# Patient Record
Sex: Male | Born: 1955 | Race: White | Hispanic: No | Smoking: Former smoker
Health system: Southern US, Community
[De-identification: ages and names within clinical notes are randomized; demographics above are authoritative.]

## PROBLEM LIST (undated history)

## (undated) DIAGNOSIS — I469 Cardiac arrest, cause unspecified: Secondary | ICD-10-CM

## (undated) DIAGNOSIS — I499 Cardiac arrhythmia, unspecified: Secondary | ICD-10-CM

## (undated) DIAGNOSIS — R0602 Shortness of breath: Secondary | ICD-10-CM

## (undated) DIAGNOSIS — D3610 Benign neoplasm of peripheral nerves and autonomic nervous system, unspecified: Secondary | ICD-10-CM

## (undated) DIAGNOSIS — R413 Other amnesia: Secondary | ICD-10-CM

## (undated) DIAGNOSIS — J302 Other seasonal allergic rhinitis: Secondary | ICD-10-CM

## (undated) DIAGNOSIS — I639 Cerebral infarction, unspecified: Secondary | ICD-10-CM

## (undated) DIAGNOSIS — N189 Chronic kidney disease, unspecified: Secondary | ICD-10-CM

## (undated) DIAGNOSIS — Z950 Presence of cardiac pacemaker: Secondary | ICD-10-CM

## (undated) DIAGNOSIS — I472 Ventricular tachycardia: Secondary | ICD-10-CM

## (undated) DIAGNOSIS — I442 Atrioventricular block, complete: Secondary | ICD-10-CM

## (undated) DIAGNOSIS — N135 Crossing vessel and stricture of ureter without hydronephrosis: Secondary | ICD-10-CM

## (undated) HISTORY — DX: Ventricular tachycardia: I47.2

## (undated) HISTORY — DX: Presence of cardiac pacemaker: Z95.0

## (undated) HISTORY — DX: Atrioventricular block, complete: I44.2

## (undated) HISTORY — DX: Cardiac arrest, cause unspecified: I46.9

---

## 2011-06-21 DIAGNOSIS — I499 Cardiac arrhythmia, unspecified: Secondary | ICD-10-CM

## 2011-06-21 HISTORY — DX: Cardiac arrhythmia, unspecified: I49.9

## 2011-07-09 ENCOUNTER — Encounter (HOSPITAL_COMMUNITY): Payer: Self-pay | Admitting: Cardiology

## 2011-07-09 ENCOUNTER — Inpatient Hospital Stay (HOSPITAL_COMMUNITY)
Admission: EM | Admit: 2011-07-09 | Discharge: 2011-07-18 | DRG: 242 | Disposition: A | Discharge status: Home Health | Payer: Medicaid Other | Attending: Cardiovascular Disease | Admitting: Cardiovascular Disease

## 2011-07-09 ENCOUNTER — Encounter (HOSPITAL_COMMUNITY)
Admission: EM | Disposition: A | Discharge status: Home Health | Payer: Self-pay | Source: Home / Self Care | Attending: Internal Medicine

## 2011-07-09 ENCOUNTER — Ambulatory Visit (HOSPITAL_COMMUNITY): Admit: 2011-07-09 | Payer: Self-pay | Admitting: Interventional Cardiology

## 2011-07-09 ENCOUNTER — Other Ambulatory Visit: Payer: Self-pay

## 2011-07-09 ENCOUNTER — Emergency Department (HOSPITAL_COMMUNITY): Payer: Medicaid Other

## 2011-07-09 DIAGNOSIS — W1809XA Striking against other object with subsequent fall, initial encounter: Secondary | ICD-10-CM | POA: Diagnosis present

## 2011-07-09 DIAGNOSIS — R7881 Bacteremia: Secondary | ICD-10-CM | POA: Diagnosis present

## 2011-07-09 DIAGNOSIS — Z79899 Other long term (current) drug therapy: Secondary | ICD-10-CM

## 2011-07-09 DIAGNOSIS — B954 Other streptococcus as the cause of diseases classified elsewhere: Secondary | ICD-10-CM | POA: Diagnosis present

## 2011-07-09 DIAGNOSIS — N365 Urethral false passage: Secondary | ICD-10-CM | POA: Diagnosis present

## 2011-07-09 DIAGNOSIS — Z87891 Personal history of nicotine dependence: Secondary | ICD-10-CM

## 2011-07-09 DIAGNOSIS — J9600 Acute respiratory failure, unspecified whether with hypoxia or hypercapnia: Secondary | ICD-10-CM

## 2011-07-09 DIAGNOSIS — I4901 Ventricular fibrillation: Principal | ICD-10-CM | POA: Diagnosis present

## 2011-07-09 DIAGNOSIS — R001 Bradycardia, unspecified: Secondary | ICD-10-CM

## 2011-07-09 DIAGNOSIS — Y9229 Other specified public building as the place of occurrence of the external cause: Secondary | ICD-10-CM

## 2011-07-09 DIAGNOSIS — I442 Atrioventricular block, complete: Secondary | ICD-10-CM | POA: Diagnosis present

## 2011-07-09 DIAGNOSIS — R092 Respiratory arrest: Secondary | ICD-10-CM | POA: Diagnosis present

## 2011-07-09 DIAGNOSIS — I469 Cardiac arrest, cause unspecified: Secondary | ICD-10-CM | POA: Diagnosis present

## 2011-07-09 DIAGNOSIS — I214 Non-ST elevation (NSTEMI) myocardial infarction: Secondary | ICD-10-CM | POA: Diagnosis not present

## 2011-07-09 DIAGNOSIS — I498 Other specified cardiac arrhythmias: Secondary | ICD-10-CM | POA: Diagnosis present

## 2011-07-09 DIAGNOSIS — S0100XA Unspecified open wound of scalp, initial encounter: Secondary | ICD-10-CM | POA: Diagnosis present

## 2011-07-09 DIAGNOSIS — Z8249 Family history of ischemic heart disease and other diseases of the circulatory system: Secondary | ICD-10-CM

## 2011-07-09 DIAGNOSIS — I10 Essential (primary) hypertension: Secondary | ICD-10-CM | POA: Diagnosis present

## 2011-07-09 HISTORY — PX: LEFT HEART CATHETERIZATION WITH CORONARY ANGIOGRAM: SHX5451

## 2011-07-09 HISTORY — PX: CARDIAC CATHETERIZATION: SHX172

## 2011-07-09 HISTORY — PX: TEMPORARY PACEMAKER INSERTION: SHX5471

## 2011-07-09 HISTORY — DX: Shortness of breath: R06.02

## 2011-07-09 LAB — CULTURE, BLOOD (ROUTINE X 2)
Culture  Setup Time: 201301191959
Culture  Setup Time: 201301191959

## 2011-07-09 LAB — COMPREHENSIVE METABOLIC PANEL
ALT: 145 U/L — ABNORMAL HIGH (ref 0–53)
ALT: 134 U/L — ABNORMAL HIGH (ref 0–53)
AST: 90 U/L — ABNORMAL HIGH (ref 0–37)
AST: 79 U/L — ABNORMAL HIGH (ref 0–37)
Albumin: 3.2 g/dL — ABNORMAL LOW (ref 3.5–5.2)
Albumin: 3.2 g/dL — ABNORMAL LOW (ref 3.5–5.2)
Alkaline Phosphatase: 96 U/L (ref 39–117)
Alkaline Phosphatase: 93 U/L (ref 39–117)
BUN: 17 mg/dL (ref 6–23)
BUN: 15 mg/dL (ref 6–23)
CO2: 21 mEq/L (ref 19–32)
CO2: 19 mEq/L (ref 19–32)
Calcium: 8 mg/dL — ABNORMAL LOW (ref 8.4–10.5)
Calcium: 6.9 mg/dL — ABNORMAL LOW (ref 8.4–10.5)
Chloride: 104 mEq/L (ref 96–112)
Chloride: 106 mEq/L (ref 96–112)
Creatinine, Ser: 1.14 mg/dL (ref 0.50–1.35)
Creatinine, Ser: 0.77 mg/dL (ref 0.50–1.35)
GFR calc Af Amer: 81 mL/min — ABNORMAL LOW (ref >90)
GFR calc Af Amer: >90 mL/min (ref >90)
GFR calc non Af Amer: 70 mL/min — ABNORMAL LOW (ref >90)
GFR calc non Af Amer: >90 mL/min (ref >90)
Glucose, Bld: 162 mg/dL — ABNORMAL HIGH (ref 70–99)
Glucose, Bld: 129 mg/dL — ABNORMAL HIGH (ref 70–99)
Potassium: 4.2 mEq/L (ref 3.5–5.1)
Potassium: 4.3 mEq/L (ref 3.5–5.1)
Sodium: 137 mEq/L (ref 135–145)
Sodium: 136 mEq/L (ref 135–145)
Total Bilirubin: 0.5 mg/dL (ref 0.3–1.2)
Total Bilirubin: 0.6 mg/dL (ref 0.3–1.2)
Total Protein: 6.1 g/dL (ref 6.0–8.3)
Total Protein: 6.1 g/dL (ref 6.0–8.3)

## 2011-07-09 LAB — CBC
HCT: 42.2 % (ref 39.0–52.0)
HCT: 39.6 % (ref 39.0–52.0)
Hemoglobin: 13.8 g/dL (ref 13.0–17.0)
Hemoglobin: 13.4 g/dL (ref 13.0–17.0)
MCH: 30.7 pg (ref 26.0–34.0)
MCH: 30.7 pg (ref 26.0–34.0)
MCHC: 32.7 g/dL (ref 30.0–36.0)
MCHC: 33.8 g/dL (ref 30.0–36.0)
MCV: 94 fL (ref 78.0–100.0)
MCV: 90.8 fL (ref 78.0–100.0)
Platelets: 189 10*3/uL (ref 150–400)
Platelets: 145 10*3/uL — ABNORMAL LOW (ref 150–400)
RBC: 4.49 MIL/uL (ref 4.22–5.81)
RBC: 4.36 MIL/uL (ref 4.22–5.81)
RDW: 14 % (ref 11.5–15.5)
RDW: 13.8 % (ref 11.5–15.5)
WBC: 15.3 10*3/uL — ABNORMAL HIGH (ref 4.0–10.5)
WBC: 14.8 10*3/uL — ABNORMAL HIGH (ref 4.0–10.5)

## 2011-07-09 LAB — CK TOTAL AND CKMB (NOT AT ARMC)
CK, MB: 2.7 ng/mL (ref 0.3–4.0)
CK, MB: 15.7 ng/mL (ref 0.3–4.0)
Relative Index: 2.1 (ref 0.0–2.5)
Relative Index: 0.8 (ref 0.0–2.5)
Total CK: 131 U/L (ref 7–232)
Total CK: 1911 U/L — ABNORMAL HIGH (ref 7–232)

## 2011-07-09 LAB — BASIC METABOLIC PANEL
BUN: 17 mg/dL (ref 6–23)
BUN: 16 mg/dL (ref 6–23)
BUN: 15 mg/dL (ref 6–23)
CO2: 17 mEq/L — ABNORMAL LOW (ref 19–32)
CO2: 18 mEq/L — ABNORMAL LOW (ref 19–32)
CO2: 19 mEq/L (ref 19–32)
Calcium: 7.5 mg/dL — ABNORMAL LOW (ref 8.4–10.5)
Calcium: 7.7 mg/dL — ABNORMAL LOW (ref 8.4–10.5)
Calcium: 8 mg/dL — ABNORMAL LOW (ref 8.4–10.5)
Chloride: 105 mEq/L (ref 96–112)
Chloride: 107 mEq/L (ref 96–112)
Chloride: 108 mEq/L (ref 96–112)
Creatinine, Ser: 1.23 mg/dL (ref 0.50–1.35)
Creatinine, Ser: 1.08 mg/dL (ref 0.50–1.35)
Creatinine, Ser: 0.87 mg/dL (ref 0.50–1.35)
GFR calc Af Amer: 74 mL/min — ABNORMAL LOW (ref >90)
GFR calc Af Amer: 86 mL/min — ABNORMAL LOW (ref >90)
GFR calc Af Amer: >90 mL/min (ref >90)
GFR calc non Af Amer: 64 mL/min — ABNORMAL LOW (ref >90)
GFR calc non Af Amer: 74 mL/min — ABNORMAL LOW (ref >90)
GFR calc non Af Amer: >90 mL/min (ref >90)
Glucose, Bld: 188 mg/dL — ABNORMAL HIGH (ref 70–99)
Glucose, Bld: 156 mg/dL — ABNORMAL HIGH (ref 70–99)
Glucose, Bld: 140 mg/dL — ABNORMAL HIGH (ref 70–99)
Potassium: 3.8 mEq/L (ref 3.5–5.1)
Potassium: 3.8 mEq/L (ref 3.5–5.1)
Potassium: 3.8 mEq/L (ref 3.5–5.1)
Sodium: 134 mEq/L — ABNORMAL LOW (ref 135–145)
Sodium: 136 mEq/L (ref 135–145)
Sodium: 137 mEq/L (ref 135–145)

## 2011-07-09 LAB — RAPID URINE DRUG SCREEN, HOSP PERFORMED
Amphetamines: NOT DETECTED
Barbiturates: NOT DETECTED
Benzodiazepines: POSITIVE — AB
Cocaine: NOT DETECTED
Opiates: NOT DETECTED
Tetrahydrocannabinol: NOT DETECTED

## 2011-07-09 LAB — POCT I-STAT, CHEM 8
BUN: 20 mg/dL (ref 6–23)
Calcium, Ion: 1.05 mmol/L — ABNORMAL LOW (ref 1.12–1.32)
Chloride: 108 mEq/L (ref 96–112)
Creatinine, Ser: 1.2 mg/dL (ref 0.50–1.35)
Glucose, Bld: 158 mg/dL — ABNORMAL HIGH (ref 70–99)
HCT: 43 % (ref 39.0–52.0)
Hemoglobin: 14.6 g/dL (ref 13.0–17.0)
Potassium: 4.4 mEq/L (ref 3.5–5.1)
Sodium: 141 mEq/L (ref 135–145)
TCO2: 22 mmol/L (ref 0–100)

## 2011-07-09 LAB — GLUCOSE, CAPILLARY
Glucose-Capillary: 136 mg/dL — ABNORMAL HIGH (ref 70–99)
Glucose-Capillary: 147 mg/dL — ABNORMAL HIGH (ref 70–99)
Glucose-Capillary: 126 mg/dL — ABNORMAL HIGH (ref 70–99)
Glucose-Capillary: 131 mg/dL — ABNORMAL HIGH (ref 70–99)

## 2011-07-09 LAB — CARDIAC PANEL(CRET KIN+CKTOT+MB+TROPI)
CK, MB: 13 ng/mL (ref 0.3–4.0)
Relative Index: 0.9 (ref 0.0–2.5)
Total CK: 1471 U/L — ABNORMAL HIGH (ref 7–232)
Troponin I: 0.96 ng/mL (ref <0.30)

## 2011-07-09 LAB — URINALYSIS, ROUTINE W REFLEX MICROSCOPIC
Bilirubin Urine: NEGATIVE
Glucose, UA: NEGATIVE mg/dL
Ketones, ur: 15 mg/dL — AB
Nitrite: NEGATIVE
Protein, ur: 30 mg/dL — AB
Specific Gravity, Urine: 1.008 (ref 1.005–1.030)
Urobilinogen, UA: 1 mg/dL (ref 0.0–1.0)
pH: 7 (ref 5.0–8.0)

## 2011-07-09 LAB — PROTIME-INR
INR: 1.29 (ref 0.00–1.49)
INR: 1.16 (ref 0.00–1.49)
INR: 1.18 (ref 0.00–1.49)
Prothrombin Time: 16.3 seconds — ABNORMAL HIGH (ref 11.6–15.2)
Prothrombin Time: 15 seconds (ref 11.6–15.2)
Prothrombin Time: 15.3 seconds — ABNORMAL HIGH (ref 11.6–15.2)

## 2011-07-09 LAB — PROCALCITONIN: Procalcitonin: <0.1 ng/mL

## 2011-07-09 LAB — POCT I-STAT 3, ART BLOOD GAS (G3+)
Acid-base deficit: 4 mmol/L — ABNORMAL HIGH (ref 0.0–2.0)
Acid-base deficit: 8 mmol/L — ABNORMAL HIGH (ref 0.0–2.0)
Bicarbonate: 22.6 mEq/L (ref 20.0–24.0)
Bicarbonate: 18 mEq/L — ABNORMAL LOW (ref 20.0–24.0)
O2 Saturation: 100 %
O2 Saturation: 100 %
Patient temperature: 98.6
Patient temperature: 33
TCO2: 24 mmol/L (ref 0–100)
TCO2: 19 mmol/L (ref 0–100)
pCO2 arterial: 48.2 mmHg — ABNORMAL HIGH (ref 35.0–45.0)
pCO2 arterial: 31 mmHg — ABNORMAL LOW (ref 35.0–45.0)
pH, Arterial: 7.279 — ABNORMAL LOW (ref 7.350–7.450)
pH, Arterial: 7.352 (ref 7.350–7.450)
pO2, Arterial: 308 mmHg — ABNORMAL HIGH (ref 80.0–100.0)
pO2, Arterial: 386 mmHg — ABNORMAL HIGH (ref 80.0–100.0)

## 2011-07-09 LAB — LEGIONELLA ANTIGEN, URINE: Legionella Antigen, Urine: NEGATIVE

## 2011-07-09 LAB — APTT
aPTT: 30 seconds (ref 24–37)
aPTT: 30 seconds (ref 24–37)
aPTT: 32 seconds (ref 24–37)

## 2011-07-09 LAB — POCT I-STAT TROPONIN I: Troponin i, poc: 0.06 ng/mL (ref 0.00–0.08)

## 2011-07-09 LAB — MRSA PCR SCREENING: MRSA by PCR: NEGATIVE

## 2011-07-09 LAB — PHOSPHORUS: Phosphorus: 1.6 mg/dL — ABNORMAL LOW (ref 2.3–4.6)

## 2011-07-09 LAB — TROPONIN I: Troponin I: 0.87 ng/mL (ref <0.30)

## 2011-07-09 LAB — URINE CULTURE
Colony Count: NO GROWTH
Culture  Setup Time: 201301200212
Culture: NO GROWTH

## 2011-07-09 LAB — CORTISOL: Cortisol, Plasma: 46.5 ug/dL

## 2011-07-09 LAB — URINE MICROSCOPIC-ADD ON

## 2011-07-09 LAB — PRO B NATRIURETIC PEPTIDE: Pro B Natriuretic peptide (BNP): 870.7 pg/mL — ABNORMAL HIGH (ref 0–125)

## 2011-07-09 LAB — MAGNESIUM: Magnesium: 1.8 mg/dL (ref 1.5–2.5)

## 2011-07-09 LAB — STREP PNEUMONIAE URINARY ANTIGEN: Strep Pneumo Urinary Antigen: NEGATIVE

## 2011-07-09 LAB — LACTIC ACID, PLASMA: Lactic Acid, Venous: 1 mmol/L (ref 0.5–2.2)

## 2011-07-09 SURGERY — LEFT HEART CATHETERIZATION WITH CORONARY ANGIOGRAM
Anesthesia: LOCAL | Laterality: Right

## 2011-07-09 MED ORDER — FENTANYL CITRATE 0.05 MG/ML IJ SOLN
50.0000 ug | Freq: Once | INTRAMUSCULAR | Status: AC
  Administered 2011-07-09: 50 ug via INTRAVENOUS

## 2011-07-09 MED ORDER — DEXTROSE 5 % IV SOLN
0.5000 ug/min | INTRAVENOUS | Status: DC
  Administered 2011-07-10: 2 ug/min via INTRAVENOUS
  Filled 2011-07-09 (×2): qty 4

## 2011-07-09 MED ORDER — DEXTROSE 5 % IV SOLN: 60.0000 mg/h | Freq: Once | INTRAVENOUS | Status: DC

## 2011-07-09 MED ORDER — CISATRACURIUM BESYLATE 2 MG/ML IV SOLN
8.0000 mg | Freq: Once | INTRAVENOUS | Status: AC | PRN
  Filled 2011-07-09: qty 4

## 2011-07-09 MED ORDER — ACETAMINOPHEN 325 MG PO TABS
650.0000 mg | ORAL_TABLET | ORAL | Status: DC | PRN
  Administered 2011-07-12 – 2011-07-15 (×5): 650 mg via ORAL
  Filled 2011-07-09 (×5): qty 2

## 2011-07-09 MED ORDER — CISATRACURIUM BESYLATE 2 MG/ML IV SOLN
0.1000 mg/kg | Freq: Once | INTRAVENOUS | Status: AC
  Administered 2011-07-09: 1 ug via INTRAVENOUS

## 2011-07-09 MED ORDER — MIDAZOLAM HCL 2 MG/2ML IJ SOLN
INTRAMUSCULAR | Status: AC
  Administered 2011-07-09: 2 mg
  Filled 2011-07-09: qty 2

## 2011-07-09 MED ORDER — FENTANYL BOLUS VIA INFUSION
50.0000 ug | Freq: Four times a day (QID) | INTRAVENOUS | Status: DC | PRN
  Filled 2011-07-09: qty 100

## 2011-07-09 MED ORDER — FENTANYL CITRATE 0.05 MG/ML IJ SOLN
INTRAMUSCULAR | Status: AC
  Administered 2011-07-09: 100 ug
  Filled 2011-07-09: qty 2

## 2011-07-09 MED ORDER — AMIODARONE IV BOLUS ONLY 150 MG/100ML
150.0000 mg | Freq: Once | INTRAVENOUS | Status: DC
  Filled 2011-07-09: qty 100

## 2011-07-09 MED ORDER — ATROPINE SULFATE 1 MG/ML IJ SOLN
1.0000 mg | Freq: Once | INTRAMUSCULAR | Status: AC
  Administered 2011-07-09: 1 mg via INTRAVENOUS

## 2011-07-09 MED ORDER — SODIUM CHLORIDE 0.9 % IV SOLN
0.5000 ug/kg/min | INTRAVENOUS | Status: DC
  Administered 2011-07-11: 1 ug/kg/min via INTRAVENOUS
  Filled 2011-07-09 (×2): qty 20

## 2011-07-09 MED ORDER — CISATRACURIUM BOLUS VIA INFUSION
0.0500 mg/kg | Freq: Once | INTRAVENOUS | Status: AC | PRN
  Filled 2011-07-09: qty 5

## 2011-07-09 MED ORDER — SODIUM CHLORIDE 0.9 % IV SOLN
1.0000 ug/kg/min | INTRAVENOUS | Status: DC
  Administered 2011-07-09: 1 ug/kg/min via INTRAVENOUS
  Filled 2011-07-09: qty 20

## 2011-07-09 MED ORDER — PANTOPRAZOLE SODIUM 40 MG IV SOLR
40.0000 mg | Freq: Every day | INTRAVENOUS | Status: DC
  Administered 2011-07-09 – 2011-07-11 (×3): 40 mg via INTRAVENOUS
  Filled 2011-07-09 (×4): qty 40

## 2011-07-09 MED ORDER — SODIUM CHLORIDE 0.9 % IV SOLN
2.0000 mg/h | INTRAVENOUS | Status: DC
  Administered 2011-07-09 – 2011-07-11 (×3): 4 mg/h via INTRAVENOUS
  Filled 2011-07-09 (×3): qty 10

## 2011-07-09 MED ORDER — CHLORHEXIDINE GLUCONATE 0.12 % MT SOLN
15.0000 mL | Freq: Two times a day (BID) | OROMUCOSAL | Status: DC
  Administered 2011-07-09 – 2011-07-13 (×7): 15 mL via OROMUCOSAL
  Filled 2011-07-09 (×6): qty 15

## 2011-07-09 MED ORDER — SODIUM CHLORIDE 0.9 % IV SOLN: 1.0000 mL/kg/h | INTRAVENOUS | Status: AC

## 2011-07-09 MED ORDER — ONDANSETRON HCL 4 MG/2ML IJ SOLN: 4.0000 mg | Freq: Four times a day (QID) | INTRAMUSCULAR | Status: DC | PRN

## 2011-07-09 MED ORDER — ASPIRIN 300 MG RE SUPP: 300.0000 mg | RECTAL | Status: DC

## 2011-07-09 MED ORDER — HYDRALAZINE HCL 20 MG/ML IJ SOLN
10.0000 mg | INTRAMUSCULAR | Status: DC | PRN
  Administered 2011-07-09: 10 mg via INTRAVENOUS

## 2011-07-09 MED ORDER — CHLORHEXIDINE GLUCONATE 0.12 % MT SOLN
OROMUCOSAL | Status: AC
  Administered 2011-07-09: 15 mL
  Filled 2011-07-09: qty 15

## 2011-07-09 MED ORDER — SODIUM CHLORIDE 0.9 % IV SOLN
INTRAVENOUS | Status: DC
  Administered 2011-07-09 – 2011-07-11 (×2): via INTRAVENOUS
  Administered 2011-07-13: 20 mL/h via INTRAVENOUS

## 2011-07-09 MED ORDER — SODIUM CHLORIDE 0.9 % IV SOLN
50.0000 ug/h | INTRAVENOUS | Status: DC
  Administered 2011-07-10: 100 ug/h via INTRAVENOUS
  Filled 2011-07-09: qty 50

## 2011-07-09 MED ORDER — MIDAZOLAM BOLUS VIA INFUSION
1.0000 mg | INTRAVENOUS | Status: DC | PRN
  Filled 2011-07-09: qty 2

## 2011-07-09 MED ORDER — ARTIFICIAL TEARS OP OINT
1.0000 "application " | TOPICAL_OINTMENT | Freq: Three times a day (TID) | OPHTHALMIC | Status: DC
  Administered 2011-07-09 – 2011-07-11 (×5): 1 via OPHTHALMIC
  Filled 2011-07-09 (×2): qty 3.5

## 2011-07-09 MED ORDER — MIDAZOLAM HCL 2 MG/2ML IJ SOLN
2.0000 mg | Freq: Once | INTRAMUSCULAR | Status: AC
  Administered 2011-07-09: 2 mg via INTRAVENOUS

## 2011-07-09 MED ORDER — HYDRALAZINE HCL 20 MG/ML IJ SOLN
INTRAMUSCULAR | Status: AC
  Filled 2011-07-09: qty 1

## 2011-07-09 MED ORDER — LABETALOL HCL 5 MG/ML IV SOLN
10.0000 mg | INTRAVENOUS | Status: DC | PRN
  Filled 2011-07-09: qty 4

## 2011-07-09 NOTE — ED Notes (Signed)
Family at contacted, Liborio Nixon [phone]

## 2011-07-09 NOTE — Progress Notes (Signed)
Chaplain Note:  Chaplain responded to page from [facility].  Chaplain provided support for [facility] staff in contacting pt's spouse.  Pt was being treated and was unable to communicate during this time.  07/09/11 1128  Clinical Encounter Type  Visited With Patient;Family;Health care provider  Visit Type ED;Spiritual support  Referral From Other (Comment) [facility] page)  Spiritual Encounters  Spiritual Needs Emotional  Stress Factors  Patient Stress Factors Health changes  Family Stress Factors Other (Comment) (Pt's spouse anxious re: pt's sudden collapse)   Verdie Shire, chaplain resident [phone]

## 2011-07-09 NOTE — Progress Notes (Signed)
Chaplain Note:  Chaplain responded to code STEMI page received at 12:47.  Chaplain provided spiritual comfort and support for pt's spouse and close friend.  Chaplain support was appreciated.  Chaplain will follow up as needed.   07/09/11 1247  Clinical Encounter Type  Visited With Patient;Family;Health care provider  Visit Type Spiritual support;Behavioral Health (Code STEMI)  Referral From Other (Comment) (Code STEMI page)  Spiritual Encounters  Spiritual Needs Emotional  Stress Factors  Patient Stress Factors Health changes  Family Stress Factors Other (Comment) (Pt's spouse anxious re; pt's sudden collapse)    Verdie Shire, chaplain resident [phone]

## 2011-07-09 NOTE — ED Notes (Signed)
Portable Xray at bedside.

## 2011-07-09 NOTE — ED Notes (Addendum)
Per [facility] pt at [employer], collapsed after giving price of work, pt unresponsive w/no pulse approx 5 mins, upon ems arrival fire dept started CPR, shocked x1 w/2 mins of compressions, pulse returned w/ resp of 20, began having agonal resp, approx 1 in. Lac to posterior head, EKG showed RBBB with a heart rate of 60, 2 lg bore IV 18 G IV RAC and 18 G IV LFA, placed on LSB, C-collar, and head blocks, pt given Versed 10 mg en route, pupils equal, pt combative w/ems, ETT #8 28 at the lip, pt shivering, hypothermia upon arrival to ED. This note was taken at 1135

## 2011-07-09 NOTE — Consult Note (Signed)
Reason for Consult: Witnessed arrest with cardiac arrhythmias  Referring Physician: Dr. Freida Busman- ER physician   Casey Richard is an 56 y.o. male.    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 Fire on scence within 2 minutes with AED that advised shock. EMS arrived and intubation in field was performed. Transported to [facility] ED with recurrent VFIB tx with Amiodarone 150 mg bolus. Developed complete heart block and was tx to Cath Lab for pacing wire and cath.  His wife stated he had the flu 1 month ago and has not felt well since.  He has been short of breath at times.  Last week his heart rate was in the 40's and he did not feel well, she thought he was having a heart attack then.  He has been using a friend's inhaler, unsure name of inhaler.  But he does feel better after use.   + family hx. Of CAD.  No past medical history on file. Had been well until the flu 1 month ago and just not felt well since. No HTN, DIABETES, or stroke.  No heart history per his wife.   No past surgical history on file.  No family history on file. Mother living she has pacemaker and ICD. 1 older brother with recent MI, father died in his 55's with Copd.  Social History:  does not have a smoking history on file. He does not have any smokeless tobacco history on file. His alcohol and drug histories not on file.  Married, 4 children, stopped using tobacco at age 46.  No etoh.  Active lifestyle, works as Surveyor, [employer].   Allergies: Allergies not on file Family not aware of any allergies.  Medications Prior to Admission  Medication Dose Route Frequency Provider Last Rate Last Dose  . amiodarone (NEXTERONE) IV bolus only 150 mg/100 mL  150 mg Intravenous Once Chrystie Nose, MD      . artificial tears (LACRILUBE) ophthalmic ointment 1 application  1 application Both  Eyes Q8H William S Minor, NP      . aspirin suppository 300 mg  300 mg Rectal NOW Vilinda Blanks Minor, NP      . atropine injection 1 mg  1 mg Intravenous Once Toy Baker, MD   1 mg at 07/09/11 1149  . atropine injection 1 mg  1 mg Intravenous Once Toy Baker, MD   1 mg at 07/09/11 1200  . atropine injection 1 mg  1 mg Intravenous Once Toy Baker, MD   1 mg at 07/09/11 1218  . cisatracurium (NIMBEX) 200 mg in sodium chloride 0.9 % 200 mL infusion  1-1.5 mcg/kg/min Intravenous Continuous William S Minor, NP      . cisatracurium (NIMBEX) 200 mg in sodium chloride 0.9 % 200 mL infusion  0.5-10 mcg/kg/min Intravenous Titrated Toy Baker, MD      . cisatracurium (NIMBEX) bolus via infusion 4.1 mg  0.05 mg/kg Intravenous Once PRN Vilinda Blanks Minor, NP      . cisatracurium (NIMBEX) injection 0.1 mg/kg  0.1 mg/kg Intravenous Once Vilinda Blanks Minor, NP   1 mcg at 07/09/11 1219  . cisatracurium (NIMBEX) injection 8 mg  8 mg Intravenous Once PRN Vilinda Blanks Minor, NP      . fentaNYL (SUBLIMAZE) 0.05 MG/ML injection  100 mcg at 07/09/11 1256  . fentaNYL (SUBLIMAZE) injection 50 mcg  50 mcg Intravenous Once Toy Baker, MD   50 mcg at 07/09/11 1307  . midazolam (VERSED) 2 MG/2ML injection        2 mg at 07/09/11 1256  . midazolam (VERSED) injection 2 mg  2 mg Intravenous Once Toy Baker, MD   2 mg at 07/09/11 1308  . norepinephrine (LEVOPHED) 4,000 mcg in dextrose 5 % 250 mL infusion  0.5-10 mcg/min Intravenous Titrated Vilinda Blanks Minor, NP      . pantoprazole (PROTONIX) injection 40 mg  40 mg Intravenous QHS Vilinda Blanks Minor, NP      . DISCONTD: amiodarone (CORDARONE) 450 mg in dextrose 5 % 250 mL infusion  60 mg/hr Intravenous Once Toy Baker, MD       No current outpatient prescriptions on file as of 07/09/2011.  OUTPATIENT MEDICATIONS:none, other than using a friend's inhaler recently.  Results for orders placed during the hospital encounter of 07/09/11 (from the past 48  hour(s))  CBC     Status: Abnormal   Collection Time   07/09/11 11:46 AM      Component Value Range Comment   WBC 15.3 (*) 4.0 - 10.5 (K/uL)    RBC 4.49  4.22 - 5.81 (MIL/uL)    Hemoglobin 13.8  13.0 - 17.0 (g/dL)    HCT 16.1  09.6 - 04.5 (%)    MCV 94.0  78.0 - 100.0 (fL)    MCH 30.7  26.0 - 34.0 (pg)    MCHC 32.7  30.0 - 36.0 (g/dL)    RDW 40.9  81.1 - 91.4 (%)    Platelets 189  150 - 400 (K/uL)   COMPREHENSIVE METABOLIC PANEL     Status: Abnormal   Collection Time   07/09/11 11:46 AM      Component Value Range Comment   Sodium 137  135 - 145 (mEq/L)    Potassium 4.2  3.5 - 5.1 (mEq/L)    Chloride 104  96 - 112 (mEq/L)    CO2 21  19 - 32 (mEq/L)    Glucose, Bld 162 (*) 70 - 99 (mg/dL)    BUN 17  6 - 23 (mg/dL)    Creatinine, Ser 7.82  0.50 - 1.35 (mg/dL)    Calcium 8.0 (*) 8.4 - 10.5 (mg/dL)    Total Protein 6.1  6.0 - 8.3 (g/dL)    Albumin 3.2 (*) 3.5 - 5.2 (g/dL)    AST 90 (*) 0 - 37 (U/L)    ALT 145 (*) 0 - 53 (U/L)    Alkaline Phosphatase 96  39 - 117 (U/L)    Total Bilirubin 0.5  0.3 - 1.2 (mg/dL)    GFR calc non Af Amer 70 (*) >90 (mL/min)    GFR calc Af Amer 81 (*) >90 (mL/min)   APTT     Status: Normal   Collection Time   07/09/11 11:46 AM      Component Value Range Comment   aPTT 30  24 - 37 (seconds)   PROTIME-INR     Status: Abnormal   Collection Time   07/09/11 11:46 AM      Component Value Range Comment   Prothrombin Time 16.3 (*) 11.6 - 15.2 (seconds)    INR 1.29  0.00 - 1.49    CK TOTAL AND CKMB     Status: Normal   Collection Time   07/09/11 11:47 AM  Component Value Range Comment   Total CK 131  7 - 232 (U/L)    CK, MB 2.7  0.3 - 4.0 (ng/mL)    Relative Index 2.1  0.0 - 2.5    POCT I-STAT TROPONIN I     Status: Normal   Collection Time   07/09/11 11:58 AM      Component Value Range Comment   Troponin i, poc 0.06  0.00 - 0.08 (ng/mL)    Comment 3            POCT I-STAT, CHEM 8     Status: Abnormal   Collection Time   07/09/11 11:59 AM       Component Value Range Comment   Sodium 141  135 - 145 (mEq/L)    Potassium 4.4  3.5 - 5.1 (mEq/L)    Chloride 108  96 - 112 (mEq/L)    BUN 20  6 - 23 (mg/dL)    Creatinine, Ser 0.98  0.50 - 1.35 (mg/dL)    Glucose, Bld 119 (*) 70 - 99 (mg/dL)    Calcium, Ion 1.47 (*) 1.12 - 1.32 (mmol/L)    TCO2 22  0 - 100 (mmol/L)    Hemoglobin 14.6  13.0 - 17.0 (g/dL)    HCT 82.9  56.2 - 13.0 (%)   POCT I-STAT 3, BLOOD GAS (G3+)     Status: Abnormal   Collection Time   07/09/11 12:41 PM      Component Value Range Comment   pH, Arterial 7.279 (*) 7.350 - 7.450     pCO2 arterial 48.2 (*) 35.0 - 45.0 (mmHg)    pO2, Arterial 308.0 (*) 80.0 - 100.0 (mmHg)    Bicarbonate 22.6  20.0 - 24.0 (mEq/L)    TCO2 24  0 - 100 (mmol/L)    O2 Saturation 100.0      Acid-base deficit 4.0 (*) 0.0 - 2.0 (mmol/L)    Patient temperature 98.6 F      Collection site ARTERIAL LINE      Drawn by Operator      Sample type ARTERIAL      Ct Head Wo Contrast  07/09/2011  *RADIOLOGY REPORT*  Clinical Data:  Witnessed collapse, occipital hematoma, intubated  CT HEAD WITHOUT CONTRAST CT CERVICAL SPINE WITHOUT CONTRAST  Technique:  Multidetector CT imaging of the head and cervical spine was performed following the standard protocol without intravenous contrast.  Multiplanar CT image reconstructions of the cervical spine were also generated.  Comparison:  None  CT HEAD  Findings: Motion degraded images.  No evidence of parenchymal hemorrhage or extra-axial fluid collection. No mass lesion, mass effect, or midline shift.  No CT evidence of acute infarction.  Cerebral volume is age appropriate.  No ventriculomegaly.  Partial opacification of the left greater than right maxillary and ethmoid sinuses.  The mastoid air cells are clear.  Extracranial hematoma overlying the right parietal scalp (series 2/image 24).  No underlying osseous abnormality.  No evidence of calvarial fracture.  IMPRESSION: Motion degraded images.  Extracranial hematoma  overlying the right parietal scalp.  No evidence of calvarial fracture.  No evidence of acute intracranial abnormality.  CT CERVICAL SPINE  Findings: Straightening of the cervical spine, likely positional.  No evidence of fracture or dislocation.  The vertebral body heights are maintained.  The dens appears intact.  No prevertebral soft tissue swelling.  Mild multilevel degenerative changes.  Visualized thyroid is unremarkable.  Endotracheal tube.  Visualized lung apices are notable for mild paraseptal  emphysematous changes.  IMPRESSION: No evidence of traumatic injury to the cervical spine.  Mild multilevel degenerative changes.  Original Report Authenticated By: Charline Bills, M.D.   Ct Cervical Spine Wo Contrast  07/09/2011  *RADIOLOGY REPORT*  Clinical Data:  Witnessed collapse, occipital hematoma, intubated  CT HEAD WITHOUT CONTRAST CT CERVICAL SPINE WITHOUT CONTRAST  Technique:  Multidetector CT imaging of the head and cervical spine was performed following the standard protocol without intravenous contrast.  Multiplanar CT image reconstructions of the cervical spine were also generated.  Comparison:  None  CT HEAD  Findings: Motion degraded images.  No evidence of parenchymal hemorrhage or extra-axial fluid collection. No mass lesion, mass effect, or midline shift.  No CT evidence of acute infarction.  Cerebral volume is age appropriate.  No ventriculomegaly.  Partial opacification of the left greater than right maxillary and ethmoid sinuses.  The mastoid air cells are clear.  Extracranial hematoma overlying the right parietal scalp (series 2/image 24).  No underlying osseous abnormality.  No evidence of calvarial fracture.  IMPRESSION: Motion degraded images.  Extracranial hematoma overlying the right parietal scalp.  No evidence of calvarial fracture.  No evidence of acute intracranial abnormality.  CT CERVICAL SPINE  Findings: Straightening of the cervical spine, likely positional.  No evidence of  fracture or dislocation.  The vertebral body heights are maintained.  The dens appears intact.  No prevertebral soft tissue swelling.  Mild multilevel degenerative changes.  Visualized thyroid is unremarkable.  Endotracheal tube.  Visualized lung apices are notable for mild paraseptal emphysematous changes.  IMPRESSION: No evidence of traumatic injury to the cervical spine.  Mild multilevel degenerative changes.  Original Report Authenticated By: Charline Bills, M.D.   Dg Chest Portable 1 View  07/09/2011  *RADIOLOGY REPORT*  Clinical Data: ET tube and left central line placement  PORTABLE CHEST - 1 VIEW  Comparison: None.  Findings: Endotracheal tube terminates 5 cm above the carina.  Left IJ venous catheter with its tip at the junction of the left brachiocephalic vein and SVC.  Suspected mild interstitial edema.  Underlying chronic interstitial markings. No pleural effusion or pneumothorax.  The heart is top normal in size.  IMPRESSION: Endotracheal tube terminates 5 cm above the carina.  Left IJ venous catheter at the junction of the left brachiocephalic vein and SVC.  No pneumothorax.  Suspected mild interstitial edema.  Original Report Authenticated By: Charline Bills, M.D.    ROS: Unable to obtain from pt. Due to intubation and sedation.  Wife reports flu 1 month ago and SOB at times since.   Blood pressure 165/70, pulse 88, resp. rate 20, height 5\' 9"  (1.753 m), weight 81.647 kg (180 lb), SpO2 100.00%.  During visit BP was as low as 88 systolic.  CHB with HR of 36.  Then paced with external pacer. PE: General: Intubated and sedated. Skin: Cool and dry., Laceration to scalp, posterior. HEENT: normocephalic with laceration as above. Neck: collar in place, unable to access carotids currently. Heart: S1S2. RRR slow at time of my exam. No obvious murmur. Lungs; Clear without rales, rhonchi or wheezes. Abd: soft, hypoactive bowel sounds. Ext: no edema, 1+ pedal. Neuro- sedated prior to sedation  per H&P MAE.  Atropine given 4 times in ER.  Pt. Sedated.  Ext. Pacer placed.   Assessment/Plan Witnessed arrest with V. Fib, treated with AED, amiodarone.  Intubated, on vent.  Plan for cardiac cath, temp. Pacemaker, and artic sun. Will follow with Critical Care.  Wife is aware of  pt's condition and has seen him prior to procedure.   Iceis Knab R 07/09/2011, 1:26 PM

## 2011-07-09 NOTE — ED Notes (Signed)
Patient transported to CT with Traci RN, Aundra Millet RN, and Coates RT

## 2011-07-09 NOTE — ED Notes (Signed)
MD at bedside. [place] PA, Minor NP, [employer]

## 2011-07-09 NOTE — Progress Notes (Signed)
CRITICAL VALUE ALERT  Critical value received: Trop 0.87 Date of notification:  2000  Time of notification: 2000  Critical value read back:yes  Nurse who received alert:  JR  MD notified (1st page):  Dr.Bice  Time of first page:  2000  MD notified (2nd page):  Time of second page:  Responding MD:  Dr.Bice  Time MD responded:  2000

## 2011-07-09 NOTE — Op Note (Signed)
PROCEDURE:  Left heart catheterization with selective coronary angiography, left ventriculogram.  Temporary transvenous  pacemaker placement.  INDICATIONS:     emergency consent.  PROCEDURE TECHNIQUE:  After Xylocaine anesthesia a F sheath was placed in the right femoral  vein and subsequently artery with  single anterior needle wall sticks.   Left coronary angiography was done using a Judkins L5 guide catheter.  Right coronary angiography was done using a Judkins R4 guide catheter.  Left ventriculography was done using a pigtail catheter.  The transvenous pacemaker was then placed.  There is some difficulty in maintaining capture at a low output.  We had lost capture at 0.6 milliamps.  We then set the pacemaker at 2 milliamps and watched for a few minutes.  We will also capture.  The transcutaneous pacemaker was quite reliable.  We repositioned the transvenous pacer several times.  Finally, it appeared to be in good position and at an output of 5 milliamps, appeared to keep a stable rhythm.  We will plan on using the transcutaneous pacer as a backup.   CONTRAST:  Total of 90  cc.  COMPLICATIONS:  None.    HEMODYNAMICS:  Aortic pressure was 171/92; LV pressure was 179/16; LVEDP 26.  There was no gradient between the left ventricle and aorta.    ANGIOGRAPHIC DATA:   The left main coronary artery is widely patent.  The left anterior descending artery is a large vessel which wraps around the apex.  It appears angiographic normal.  There is a large first diagonal which appears widely patent.  The second and third diagonals are small but patent.  The left circumflex artery is a large vessel.  There is a large OM1 which presents grafting normal.  The circumflex system appears in grafting normal.  There are small OM1 and OM 2 vessels..  The right coronary artery is a large dominant vessel.  The PLA is small but patent.  The PDA is a large vessel and widely patent.  There is a large conus branch.  The  JR 4 catheter tends to select the conus branch.Marland Kitchen  LEFT VENTRICULOGRAM:  Left ventricular angiogram was done in the 30 RAO projection and revealed normal left ventricular wall motion and systolic function with an estimated ejection fraction of 55%.  LVEDP was 26  mmHg.  IMPRESSIONS:  1. No significant coronary artery disease.   2. Normal left ventricular systolic function.  LVEDP 26  mmHg.  Ejection fraction 55% 3. Transvenous pacemaker placement.  Rate of 60.  Output 5 milliamps.  Despite multiple attempts at positioning, unable to maintain capture at an output of 2 or below.  Stable rhythm with an output of 5 milliamps.  We'll keep transcutaneous pacer in place to be used as a backup.  RECOMMENDATION:  Intubated and sedated.  He is on the [employer] sun protocol.  Further cardiac management per Dr. Rennis Golden.  He may need a permanent pacemaker.

## 2011-07-09 NOTE — Consult Note (Signed)
Urology Consult   Physician requesting consult: Dr. Marchelle Gearing  Reason for consult: Difficult urethral catheter placement.  History of Present Illness: Casey Richard is a 56 y.o. male who presented today in cardiac arrest.  He is currently admitted to the CCU.  Attempts to place a foley catheter and coude catheter by the nursing staff were unsuccessful with return of blood after catheter attempts.  He has no known urologic history and has undergone no prior known urologic surgery. I am unable to obtain any significant history as the patient is sedated and intubated.   Past Medical History  Diagnosis Date  . Shortness of breath     Past Surgical History  Procedure Date  . No past surgeries     Home Medications:  Medications Prior to Admission  Medication Dose Route Frequency Provider Last Rate Last Dose  . 0.9 %  sodium chloride infusion  1 mL/kg/hr Intravenous Continuous Corky Crafts, MD [phone] mL/hr at 07/09/11 1735 81.6 mL/kg/hr at 07/09/11 1735  . acetaminophen (TYLENOL) tablet 650 mg  650 mg Oral Q4H PRN Corky Crafts, MD      . amiodarone (NEXTERONE) IV bolus only 150 mg/100 mL  150 mg Intravenous Once Chrystie Nose, MD      . artificial tears (LACRILUBE) ophthalmic ointment 1 application  1 application Both Eyes Q8H William S Minor, NP      . aspirin suppository 300 mg  300 mg Rectal NOW Vilinda Blanks Minor, NP      . atropine injection 1 mg  1 mg Intravenous Once Toy Baker, MD   1 mg at 07/09/11 1149  . atropine injection 1 mg  1 mg Intravenous Once Toy Baker, MD   1 mg at 07/09/11 1200  . atropine injection 1 mg  1 mg Intravenous Once Toy Baker, MD   1 mg at 07/09/11 1218  . cisatracurium (NIMBEX) 200 mg in sodium chloride 0.9 % 200 mL infusion  1-1.5 mcg/kg/min Intravenous Continuous Vilinda Blanks Minor, NP 4.9 mL/hr at 07/09/11 1732 1 mcg/kg/min at 07/09/11 1732  . cisatracurium (NIMBEX) 200 mg in sodium chloride 0.9 % 200 mL infusion   0.5-10 mcg/kg/min Intravenous Titrated Toy Baker, MD      . cisatracurium (NIMBEX) bolus via infusion 4.1 mg  0.05 mg/kg Intravenous Once PRN Vilinda Blanks Minor, NP      . cisatracurium (NIMBEX) injection 0.1 mg/kg  0.1 mg/kg Intravenous Once Vilinda Blanks Minor, NP   1 mcg at 07/09/11 1219  . cisatracurium (NIMBEX) injection 8 mg  8 mg Intravenous Once PRN Vilinda Blanks Minor, NP      . fentaNYL (SUBLIMAZE) 0.05 MG/ML injection        100 mcg at 07/09/11 1256  . fentaNYL (SUBLIMAZE) 10 mcg/mL in sodium chloride 0.9 % 250 mL infusion  50-400 mcg/hr Intravenous Titrated Nolon Lennert, MD       And  . fentaNYL (SUBLIMAZE) bolus via infusion 50-100 mcg  50-100 mcg Intravenous Q6H PRN Nolon Lennert, MD      . fentaNYL (SUBLIMAZE) injection 50 mcg  50 mcg Intravenous Once Toy Baker, MD   50 mcg at 07/09/11 1307  . hydrALAZINE (APRESOLINE) 20 MG/ML injection           . labetalol (NORMODYNE,TRANDATE) injection 10 mg  10 mg Intravenous Q10 min PRN Nolon Lennert, MD      . midazolam (VERSED) 1 mg/mL in sodium chloride 0.9 % 50 mL infusion  2-10 mg/hr Intravenous Titrated Nolon Lennert, MD       And  . midazolam (VERSED) bolus via infusion 1-2 mg  1-2 mg Intravenous Q2H PRN Nolon Lennert, MD      . midazolam (VERSED) 2 MG/2ML injection        2 mg at 07/09/11 1256  . midazolam (VERSED) injection 2 mg  2 mg Intravenous Once Toy Baker, MD   2 mg at 07/09/11 1308  . norepinephrine (LEVOPHED) 4,000 mcg in dextrose 5 % 250 mL infusion  0.5-10 mcg/min Intravenous Titrated Vilinda Blanks Minor, NP      . ondansetron (ZOFRAN) injection 4 mg  4 mg Intravenous Q6H PRN Corky Crafts, MD      . pantoprazole (PROTONIX) injection 40 mg  40 mg Intravenous QHS Vilinda Blanks Minor, NP      . DISCONTD: amiodarone (CORDARONE) 450 mg in dextrose 5 % 250 mL infusion  60 mg/hr Intravenous Once Toy Baker, MD      . DISCONTD: hydrALAZINE (APRESOLINE) injection 10 mg  10 mg Intravenous Q4H PRN Chrystie Nose, MD   10  mg at 07/09/11 1500   No current outpatient prescriptions on file as of 07/09/2011.    Current Hospital Medications: Scheduled Meds:   . amiodarone  150 mg Intravenous Once  . artificial tears  1 application Both Eyes Q8H  . aspirin  300 mg Rectal NOW  . atropine  1 mg Intravenous Once  . atropine  1 mg Intravenous Once  . atropine  1 mg Intravenous Once  . cisatracurium  0.1 mg/kg Intravenous Once  . fentaNYL      . fentaNYL  50 mcg Intravenous Once  . hydrALAZINE      . midazolam      . midazolam  2 mg Intravenous Once  . pantoprazole (PROTONIX) IV  40 mg Intravenous QHS  . DISCONTD: amiodarone (CORDARONE) infusion  60 mg/hr Intravenous Once   Continuous Infusions:   . sodium chloride 81.6 mL/kg/hr (07/09/11 1735)  . cisatracurium (NIMBEX) infusion 1 mcg/kg/min (07/09/11 1732)  . cisatracurium (NIMBEX) infusion    . fentaNYL infusion INTRAVENOUS    . midazolam (VERSED) infusion    . norepinephrine (LEVOPHED) Adult infusion     PRN Meds:.acetaminophen, cisatracurium, cisatracurium, fentaNYL, labetalol, midazolam, ondansetron (ZOFRAN) IV, DISCONTD: hydrALAZINE  Allergies: No Known Allergies  Family History  Problem Relation Age of Onset  . Coronary artery disease Mother   . COPD Father   . Coronary artery disease Brother     Social History:  reports that he quit smoking about 22 years ago. He does not have any smokeless tobacco history on file. He reports that he does not drink alcohol or use illicit drugs.  ROS: A complete review of systems was performed.  All systems are negative except for pertinent findings as noted.  Physical Exam:  Vital signs in last 24 hours: Temp:  [92.2 F (33.4 C)-96.6 F (35.9 C)] 92.2 F (33.4 C) (01/19 1700) Pulse Rate:  [37-92] 59  (01/19 1730) Resp:  [0-27] 17  (01/19 1730) BP: (86-174)/(43-94) 174/94 mmHg (01/19 1515) SpO2:  [0 %-100 %] 100 % (01/19 1730) Arterial Line BP: (176-217)/(75-101) 177/76 mmHg (01/19 1730) FiO2 (%):   [100 %] 100 % (01/19 1602) Weight:  [79.379 kg (175 lb)-79.606 kg (175 lb 8 oz)] 79.379 kg (175 lb) (01/19 1445) General:  Intubated HEENT: Normocephalic, atraumatic Neck: No JVD Abdomen: SP distention. GU: Normal male phallus.  He has  a catheter in his meatus with grossly bloodly drainage.  It is clearly not in far enough to be in the bladder.  The balloon is inflated. Extremities: No edema Neurologic: Unable to assess.  Laboratory Data:   Basename 07/09/11 1300 07/09/11 1159 07/09/11 1146  WBC 14.8* -- 15.3*  HGB 13.4 14.6 13.8  HCT 39.6 43.0 42.2  PLT 145* -- 189     Basename 07/09/11 1300 07/09/11 1159 07/09/11 1146  NA 136 141 137  K 4.3 4.4 4.2  CL 106 108 104  GLUCOSE 129* 158* 162*  BUN 15 20 17   CALCIUM 6.9* -- 8.0*  CREATININE 0.77 1.20 1.14     Results for orders placed during the hospital encounter of 07/09/11 (from the past 24 hour(s))  CBC     Status: Abnormal   Collection Time   07/09/11 11:46 AM      Component Value Range   WBC 15.3 (*) 4.0 - 10.5 (K/uL)   RBC 4.49  4.22 - 5.81 (MIL/uL)   Hemoglobin 13.8  13.0 - 17.0 (g/dL)   HCT 11.9  14.7 - 82.9 (%)   MCV 94.0  78.0 - 100.0 (fL)   MCH 30.7  26.0 - 34.0 (pg)   MCHC 32.7  30.0 - 36.0 (g/dL)   RDW 56.2  13.0 - 86.5 (%)   Platelets 189  150 - 400 (K/uL)  COMPREHENSIVE METABOLIC PANEL     Status: Abnormal   Collection Time   07/09/11 11:46 AM      Component Value Range   Sodium 137  135 - 145 (mEq/L)   Potassium 4.2  3.5 - 5.1 (mEq/L)   Chloride 104  96 - 112 (mEq/L)   CO2 21  19 - 32 (mEq/L)   Glucose, Bld 162 (*) 70 - 99 (mg/dL)   BUN 17  6 - 23 (mg/dL)   Creatinine, Ser 7.84  0.50 - 1.35 (mg/dL)   Calcium 8.0 (*) 8.4 - 10.5 (mg/dL)   Total Protein 6.1  6.0 - 8.3 (g/dL)   Albumin 3.2 (*) 3.5 - 5.2 (g/dL)   AST 90 (*) 0 - 37 (U/L)   ALT 145 (*) 0 - 53 (U/L)   Alkaline Phosphatase 96  39 - 117 (U/L)   Total Bilirubin 0.5  0.3 - 1.2 (mg/dL)   GFR calc non Af Amer 70 (*) >90 (mL/min)   GFR  calc Af Amer 81 (*) >90 (mL/min)  APTT     Status: Normal   Collection Time   07/09/11 11:46 AM      Component Value Range   aPTT 30  24 - 37 (seconds)  PROTIME-INR     Status: Abnormal   Collection Time   07/09/11 11:46 AM      Component Value Range   Prothrombin Time 16.3 (*) 11.6 - 15.2 (seconds)   INR 1.29  0.00 - 1.49   CK TOTAL AND CKMB     Status: Normal   Collection Time   07/09/11 11:47 AM      Component Value Range   Total CK 131  7 - 232 (U/L)   CK, MB 2.7  0.3 - 4.0 (ng/mL)   Relative Index 2.1  0.0 - 2.5   POCT I-STAT TROPONIN I     Status: Normal   Collection Time   07/09/11 11:58 AM      Component Value Range   Troponin i, poc 0.06  0.00 - 0.08 (ng/mL)   Comment 3  POCT I-STAT, CHEM 8     Status: Abnormal   Collection Time   07/09/11 11:59 AM      Component Value Range   Sodium 141  135 - 145 (mEq/L)   Potassium 4.4  3.5 - 5.1 (mEq/L)   Chloride 108  96 - 112 (mEq/L)   BUN 20  6 - 23 (mg/dL)   Creatinine, Ser 2.84  0.50 - 1.35 (mg/dL)   Glucose, Bld 132 (*) 70 - 99 (mg/dL)   Calcium, Ion 4.40 (*) 1.12 - 1.32 (mmol/L)   TCO2 22  0 - 100 (mmol/L)   Hemoglobin 14.6  13.0 - 17.0 (g/dL)   HCT 10.2  72.5 - 36.6 (%)  POCT I-STAT 3, BLOOD GAS (G3+)     Status: Abnormal   Collection Time   07/09/11 12:41 PM      Component Value Range   pH, Arterial 7.279 (*) 7.350 - 7.450    pCO2 arterial 48.2 (*) 35.0 - 45.0 (mmHg)   pO2, Arterial 308.0 (*) 80.0 - 100.0 (mmHg)   Bicarbonate 22.6  20.0 - 24.0 (mEq/L)   TCO2 24  0 - 100 (mmol/L)   O2 Saturation 100.0     Acid-base deficit 4.0 (*) 0.0 - 2.0 (mmol/L)   Patient temperature 98.6 F     Collection site ARTERIAL LINE     Drawn by Operator     Sample type ARTERIAL    PROTIME-INR     Status: Normal   Collection Time   07/09/11  1:00 PM      Component Value Range   Prothrombin Time 15.0  11.6 - 15.2 (seconds)   INR 1.16  0.00 - 1.49   APTT     Status: Normal   Collection Time   07/09/11  1:00 PM       Component Value Range   aPTT 30  24 - 37 (seconds)  COMPREHENSIVE METABOLIC PANEL     Status: Abnormal   Collection Time   07/09/11  1:00 PM      Component Value Range   Sodium 136  135 - 145 (mEq/L)   Potassium 4.3  3.5 - 5.1 (mEq/L)   Chloride 106  96 - 112 (mEq/L)   CO2 19  19 - 32 (mEq/L)   Glucose, Bld 129 (*) 70 - 99 (mg/dL)   BUN 15  6 - 23 (mg/dL)   Creatinine, Ser 4.40  0.50 - 1.35 (mg/dL)   Calcium 6.9 (*) 8.4 - 10.5 (mg/dL)   Total Protein 6.1  6.0 - 8.3 (g/dL)   Albumin 3.2 (*) 3.5 - 5.2 (g/dL)   AST 79 (*) 0 - 37 (U/L)   ALT 134 (*) 0 - 53 (U/L)   Alkaline Phosphatase 93  39 - 117 (U/L)   Total Bilirubin 0.6  0.3 - 1.2 (mg/dL)   GFR calc non Af Amer >90  >90 (mL/min)   GFR calc Af Amer >90  >90 (mL/min)  MAGNESIUM     Status: Normal   Collection Time   07/09/11  1:00 PM      Component Value Range   Magnesium 1.8  1.5 - 2.5 (mg/dL)  PHOSPHORUS     Status: Abnormal   Collection Time   07/09/11  1:00 PM      Component Value Range   Phosphorus 1.6 (*) 2.3 - 4.6 (mg/dL)  CBC     Status: Abnormal   Collection Time   07/09/11  1:00 PM      Component  Value Range   WBC 14.8 (*) 4.0 - 10.5 (K/uL)   RBC 4.36  4.22 - 5.81 (MIL/uL)   Hemoglobin 13.4  13.0 - 17.0 (g/dL)   HCT 40.9  81.1 - 91.4 (%)   MCV 90.8  78.0 - 100.0 (fL)   MCH 30.7  26.0 - 34.0 (pg)   MCHC 33.8  30.0 - 36.0 (g/dL)   RDW 78.2  95.6 - 21.3 (%)   Platelets 145 (*) 150 - 400 (K/uL)  LACTIC ACID, PLASMA     Status: Normal   Collection Time   07/09/11  1:01 PM      Component Value Range   Lactic Acid, Venous 1.0  0.5 - 2.2 (mmol/L)  PROCALCITONIN     Status: Normal   Collection Time   07/09/11  1:01 PM      Component Value Range   Procalcitonin <0.10    PRO B NATRIURETIC PEPTIDE     Status: Abnormal   Collection Time   07/09/11  1:01 PM      Component Value Range   Pro B Natriuretic peptide (BNP) 870.7 (*) 0 - 125 (pg/mL)  MRSA PCR SCREENING     Status: Normal   Collection Time   07/09/11  2:51 PM       Component Value Range   MRSA by PCR NEGATIVE  NEGATIVE   GLUCOSE, CAPILLARY     Status: Abnormal   Collection Time   07/09/11  4:06 PM      Component Value Range   Glucose-Capillary 136 (*) 70 - 99 (mg/dL)   Comment 1 Documented in Chart     Comment 2 Notify RN    POCT I-STAT 3, BLOOD GAS (G3+)     Status: Abnormal   Collection Time   07/09/11  5:29 PM      Component Value Range   pH, Arterial 7.352  7.350 - 7.450    pCO2 arterial 31.0 (*) 35.0 - 45.0 (mmHg)   pO2, Arterial 386.0 (*) 80.0 - 100.0 (mmHg)   Bicarbonate 18.0 (*) 20.0 - 24.0 (mEq/L)   TCO2 19  0 - 100 (mmol/L)   O2 Saturation 100.0     Acid-base deficit 8.0 (*) 0.0 - 2.0 (mmol/L)   Patient temperature 33.0 C     Collection site RADIAL, ALLEN'S TEST ACCEPTABLE     Drawn by Operator     Sample type ARTERIAL     Recent Results (from the past 240 hour(s))  MRSA PCR SCREENING     Status: Normal   Collection Time   07/09/11  2:51 PM      Component Value Range Status Comment   MRSA by PCR NEGATIVE  NEGATIVE  Final     Renal Function:  Basename 07/09/11 1300 07/09/11 1159 07/09/11 1146  CREATININE 0.77 1.20 1.14   Estimated Creatinine Clearance: 108.5 ml/min (by C-G formula based on Cr of 0.77).  Procedure: His catheter was removed.  I attempted to place a 16 Fr coude catheter under sterile conditions. I met resistance in the proximal urethra.  I therefore performed flexible cystoscopy.  He was noted to have a very large false passage in the bulbar urethra.  No true urethral lumen could be identified.  I shaved and prepped his suprapubic region. A stab incision was made in the midline of the lower abdomen.  A spinal needle was placed into the lower abdomen and aspirated with return of clear urine. A Cook SP tube was placed with the aid of  a trocar directly into the bladder with return of clear urine.  A total of > 700 cc was immediately returned. This was secured to the skin with 3-0 silk sutures. A dressing was  applied.  Impression/Assessment:  Difficult foley catheter due to foley trauma and subsequent false passage.  The patient will be at high risk for development of subsequent urethral stricture and need for further surgery if he survives current medical issues.   Plan:  Leave SP tube to straight drainage and take precautions so that it does not get pulled out.  If he recovers from his current medical issues, he will require subsequent outpatient followup for further management of his urethral trauma and SP tube.  Please call if any problems with the SP tube during his hospitalization.  Laquesha Holcomb,LES 07/09/2011, 6:34 PM    Moody Bruins MD
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
## 2011-07-09 NOTE — Consult Note (Signed)
Pt. Seen and examined. Agree with the NP/PA-C note as written.  New patient without any known heart history. Witnessed cardiac arrest with VF / shocked, underlying complete heart block (atrial rate in the 90's), without ventricular escape. Recurrent VF, got amiodarone for this.  Continued bradycardia and hypotension. No response to atropine, as expected. Able to pace capture externally.  He was initially combative, then unresponsive. Intubated, then taken to the cath lab.  No obstructive CAD on cath with a preserved LVEF. Temporary pacer placed from the leg with intermittent capture. Will transfer to the CCU under PCCM's care for [facility] protocol.  Plan to re-assess rhythm and mental status after re-warming.  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  Will very likely need a permanent pacemaker.  Chrystie Nose, MD Attending Cardiologist [facility]

## 2011-07-09 NOTE — ED Provider Notes (Addendum)
History     CSN: [id number]  Arrival date & time 07/09/11  1137   First MD Initiated Contact with Patient 07/09/11 1155      Chief Complaint  Patient presents with  . Cardiac Arrest    (Consider location/radiation/quality/duration/timing/severity/associated sxs/prior treatment) The history is provided by the EMS personnel. The history is limited by the condition of the patient.   patient presents after having a witnessed arrest. Have bystander CPR approximately 2 minutes. An AED was applied and a via shock. Patient shocked once EMS was called. On arrival patient was combative was given Versed 2 mg IV push and was intubated. Patient had a pulse of blood pressure at that time and was transported here  No past medical history on file.  No past surgical history on file.  No family history on file.  History  Substance Use Topics  . Smoking status: Not on file  . Smokeless tobacco: Not on file  . Alcohol Use: Not on file      Review of Systems  Unable to perform ROS   Allergies  Review of patient's allergies indicates not on file.  Home Medications  No current outpatient prescriptions on file.  There were no vitals taken for this visit.  Physical Exam  Nursing note and vitals reviewed. Constitutional: He appears well-nourished. He has a sickly appearance. He appears ill. He is intubated. Cervical collar and backboard in place.  HENT:  Head:    Eyes: Conjunctivae, EOM and lids are normal. Pupils are equal, round, and reactive to light.  Neck: Normal range of motion. Neck supple. No tracheal deviation present. No mass present.  Cardiovascular: Regular rhythm and normal heart sounds.  Bradycardia present.  Exam reveals no gallop.   No murmur heard. Pulmonary/Chest: Breath sounds normal. No stridor. He is intubated. No respiratory distress. He has no decreased breath sounds. He has no wheezes. He has no rhonchi. He has no rales.  Abdominal: Soft. Normal appearance and  bowel sounds are normal. He exhibits no distension. There is no tenderness. There is no rebound and no CVA tenderness.  Musculoskeletal: Normal range of motion. He exhibits no edema and no tenderness.  Neurological: He is unresponsive. GCS eye subscore is 1. GCS verbal subscore is 1. GCS motor subscore is 1.  Skin: Skin is warm and dry. No abrasion and no rash noted.    ED Course  Procedures (including critical care time)  Labs Reviewed  CBC - Abnormal; Notable for the following:    WBC 15.3 (*)    All other components within normal limits  COMPREHENSIVE METABOLIC PANEL - Abnormal; Notable for the following:    Glucose, Bld 162 (*)    Calcium 8.0 (*)    Albumin 3.2 (*)    AST 90 (*)    ALT 145 (*)    GFR calc non Af Amer 70 (*)    GFR calc Af Amer 81 (*)    All other components within normal limits  PROTIME-INR - Abnormal; Notable for the following:    Prothrombin Time 16.3 (*)    All other components within normal limits  POCT I-STAT, CHEM 8 - Abnormal; Notable for the following:    Glucose, Bld 158 (*)    Calcium, Ion 1.05 (*)    All other components within normal limits  APTT  CK TOTAL AND CKMB  POCT I-STAT TROPONIN I  I-STAT TROPONIN I  I-STAT, CHEM 8   Ct Head Wo Contrast  07/09/2011  *RADIOLOGY REPORT*  Clinical Data:  Witnessed collapse, occipital hematoma, intubated  CT HEAD WITHOUT CONTRAST CT CERVICAL SPINE WITHOUT CONTRAST  Technique:  Multidetector CT imaging of the head and cervical spine was performed following the standard protocol without intravenous contrast.  Multiplanar CT image reconstructions of the cervical spine were also generated.  Comparison:  None  CT HEAD  Findings: Motion degraded images.  No evidence of parenchymal hemorrhage or extra-axial fluid collection. No mass lesion, mass effect, or midline shift.  No CT evidence of acute infarction.  Cerebral volume is age appropriate.  No ventriculomegaly.  Partial opacification of the left greater than right  maxillary and ethmoid sinuses.  The mastoid air cells are clear.  Extracranial hematoma overlying the right parietal scalp (series 2/image 24).  No underlying osseous abnormality.  No evidence of calvarial fracture.  IMPRESSION: Motion degraded images.  Extracranial hematoma overlying the right parietal scalp.  No evidence of calvarial fracture.  No evidence of acute intracranial abnormality.  CT CERVICAL SPINE  Findings: Straightening of the cervical spine, likely positional.  No evidence of fracture or dislocation.  The vertebral body heights are maintained.  The dens appears intact.  No prevertebral soft tissue swelling.  Mild multilevel degenerative changes.  Visualized thyroid is unremarkable.  Endotracheal tube.  Visualized lung apices are notable for mild paraseptal emphysematous changes.  IMPRESSION: No evidence of traumatic injury to the cervical spine.  Mild multilevel degenerative changes.  Original Report Authenticated By: Charline Bills, M.D.   Ct Cervical Spine Wo Contrast  07/09/2011  *RADIOLOGY REPORT*  Clinical Data:  Witnessed collapse, occipital hematoma, intubated  CT HEAD WITHOUT CONTRAST CT CERVICAL SPINE WITHOUT CONTRAST  Technique:  Multidetector CT imaging of the head and cervical spine was performed following the standard protocol without intravenous contrast.  Multiplanar CT image reconstructions of the cervical spine were also generated.  Comparison:  None  CT HEAD  Findings: Motion degraded images.  No evidence of parenchymal hemorrhage or extra-axial fluid collection. No mass lesion, mass effect, or midline shift.  No CT evidence of acute infarction.  Cerebral volume is age appropriate.  No ventriculomegaly.  Partial opacification of the left greater than right maxillary and ethmoid sinuses.  The mastoid air cells are clear.  Extracranial hematoma overlying the right parietal scalp (series 2/image 24).  No underlying osseous abnormality.  No evidence of calvarial fracture.   IMPRESSION: Motion degraded images.  Extracranial hematoma overlying the right parietal scalp.  No evidence of calvarial fracture.  No evidence of acute intracranial abnormality.  CT CERVICAL SPINE  Findings: Straightening of the cervical spine, likely positional.  No evidence of fracture or dislocation.  The vertebral body heights are maintained.  The dens appears intact.  No prevertebral soft tissue swelling.  Mild multilevel degenerative changes.  Visualized thyroid is unremarkable.  Endotracheal tube.  Visualized lung apices are notable for mild paraseptal emphysematous changes.  IMPRESSION: No evidence of traumatic injury to the cervical spine.  Mild multilevel degenerative changes.  Original Report Authenticated By: Charline Bills, M.D.     No diagnosis found.   Date: 07/09/2011  Rate: 40  Rhythm: indeterminate  QRS Axis: indeterminate  Intervals: QT prolonged  ST/T Wave abnormalities: nonspecific ST changes  Conduction Disutrbances:complete heart block  Narrative Interpretation:   Old EKG Reviewed: none available    MDM  CRITICAL CARE Performed by: Toy Baker   Total critical care time: 60  Critical care time was exclusive of separately billable procedures and treating other patients.  Critical care was  necessary to treat or prevent imminent or life-threatening deterioration.  Critical care was time spent personally by me on the following activities: development of treatment plan with patient and/or surrogate as well as nursing, discussions with consultants, evaluation of patient's response to treatment, examination of patient, obtaining history from patient or surrogate, ordering and performing treatments and interventions, ordering and review of laboratory studies, ordering and review of radiographic studies, pulse oximetry and re-evaluation of patient's condition.  On patient's initial presentation patient had a pulse and a blood pressure. He did appear to be  bradycardic and then the patient developed ventricular fibrillation. He was given amiodarone for this. He subsequently became hypotensive was given IV fluids as well as doses of atropine for his bradycardia. Blood pressure never went below 85. He was taken emergently to CT scan which showed a negative head CT.  Patient had a consult call to pulmonary critical care as well as cardiology. Patient was placed on external pacemaker. Laceration of the scalp was repaired by the physician assistant. Patient is to go to the cardiac catheterization lab per cardiology on call      Toy Baker, MD 07/09/11 1240  Toy Baker, MD 07/09/11 1242

## 2011-07-09 NOTE — Procedures (Signed)
Central Venous Catheter Insertion Procedure Note Casey Richard [id number] 09/19/1955  Procedure: Insertion of Central Venous Catheter Indications: Assessment of intravascular volume  Procedure Details Consent: Risks of procedure as well as the alternatives and risks of each were explained to the (patient/caregiver).  Consent for procedure obtained. Time Out: Verified patient identification, verified procedure, site/side was marked, verified correct patient position, special equipment/implants available, medications/allergies/relevent history reviewed, required imaging and test results available.  Performed  Maximum sterile technique was used including antiseptics, cap, gloves, gown, hand hygiene, mask and sheet. Skin prep: Chlorhexidine; local anesthetic administered A antimicrobial bonded/coated triple lumen catheter was placed in the left internal jugular vein using the Seldinger technique. Ultrasound guidance used.yes Catheter placed to 20 cm. Blood aspirated via all 3 ports and then flushed x 3. Line sutured x 2 and dressing applied.  Evaluation Blood flow good Complications: No apparent complications Patient did tolerate procedure well. Chest X-ray ordered to verify placement.  CXR: normal.  Brett Canales Minor ACNP [facility] P[facility] Pager [phone] till 3 pm If no answer page [phone] 07/09/2011, 1:05 PM  STAFF NOTE Personally supervised procedure in the ER  Dr. Kalman Shan, M.D., [facility].C.P Pulmonary and Critical Care Medicine Staff Physician [facility] Pager: [phone], If no answer or between  15:00h - 7:00h: call [phone]  07/09/2011 1:45 PM

## 2011-07-09 NOTE — Procedures (Signed)
Arterial Catheter Insertion Procedure Note Casey Richard [id number] Jul 12, 1955  Procedure: Insertion of Arterial Catheter  Indications: Blood pressure monitoring  Procedure Details Consent: Unable to obtain consent because of emergent medical necessity. Time Out: Verified patient identification, verified procedure, site/side was marked, verified correct patient position, special equipment/implants available, medications/allergies/relevent history reviewed, required imaging and test results available.  Performed  Maximum sterile technique was used including antiseptics, cap, gloves, gown and hand hygiene. Skin prep: Chlorhexidine; local anesthetic administered 20 gauge catheter was inserted into right radial artery using the Seldinger technique.  Evaluation Blood flow good; BP tracing good. Complications: No apparent complications.   Lysbeth Penner [facility] 07/09/2011

## 2011-07-09 NOTE — ED Notes (Signed)
Started external pacing @ 80, 28 milliamps

## 2011-07-09 NOTE — Progress Notes (Signed)
eLink Physician-Brief Progress Note Patient Name: Casey Richard DOB: 25-Sep-1955 MRN: [id number]  Date of Service  07/09/2011   HPI/Events of Note   No sedation ordered while paralyzed, hypertensive  eICU Interventions   Continuous Sedation orderset placed Labetolol PRN for hypertension    Intervention Category Intermediate Interventions: Hypertension - evaluation and management;Medication change / dose adjustment  Marwa Fuhrman J 07/09/2011, 4:58 PM

## 2011-07-09 NOTE — ED Notes (Signed)
Pt to cath lab.

## 2011-07-09 NOTE — ED Notes (Signed)
EDP Freida Busman, Traci rn, megan rn, ed rn, Garrett Bowring rn, dj emt, [place] rt, eddie chaplin at bedside

## 2011-07-09 NOTE — ED Notes (Signed)
Family at bedside. 

## 2011-07-10 ENCOUNTER — Inpatient Hospital Stay (HOSPITAL_COMMUNITY): Payer: Medicaid Other

## 2011-07-10 ENCOUNTER — Encounter (HOSPITAL_COMMUNITY): Payer: Self-pay

## 2011-07-10 ENCOUNTER — Ambulatory Visit (HOSPITAL_COMMUNITY): Admit: 2011-07-10 | Payer: Self-pay | Admitting: Interventional Cardiology

## 2011-07-10 DIAGNOSIS — I469 Cardiac arrest, cause unspecified: Secondary | ICD-10-CM

## 2011-07-10 DIAGNOSIS — I214 Non-ST elevation (NSTEMI) myocardial infarction: Secondary | ICD-10-CM | POA: Diagnosis not present

## 2011-07-10 DIAGNOSIS — J9600 Acute respiratory failure, unspecified whether with hypoxia or hypercapnia: Secondary | ICD-10-CM

## 2011-07-10 LAB — BASIC METABOLIC PANEL
BUN: 14 mg/dL (ref 6–23)
BUN: 13 mg/dL (ref 6–23)
BUN: 12 mg/dL (ref 6–23)
BUN: 12 mg/dL (ref 6–23)
BUN: 13 mg/dL (ref 6–23)
BUN: 12 mg/dL (ref 6–23)
BUN: 13 mg/dL (ref 6–23)
CO2: 18 mEq/L — ABNORMAL LOW (ref 19–32)
CO2: 19 mEq/L (ref 19–32)
CO2: 18 mEq/L — ABNORMAL LOW (ref 19–32)
CO2: 18 mEq/L — ABNORMAL LOW (ref 19–32)
CO2: 20 mEq/L (ref 19–32)
CO2: 20 mEq/L (ref 19–32)
CO2: 18 mEq/L — ABNORMAL LOW (ref 19–32)
Calcium: 8 mg/dL — ABNORMAL LOW (ref 8.4–10.5)
Calcium: 8.2 mg/dL — ABNORMAL LOW (ref 8.4–10.5)
Calcium: 8 mg/dL — ABNORMAL LOW (ref 8.4–10.5)
Calcium: 7.9 mg/dL — ABNORMAL LOW (ref 8.4–10.5)
Calcium: 8 mg/dL — ABNORMAL LOW (ref 8.4–10.5)
Calcium: 8 mg/dL — ABNORMAL LOW (ref 8.4–10.5)
Calcium: 8 mg/dL — ABNORMAL LOW (ref 8.4–10.5)
Chloride: 109 mEq/L (ref 96–112)
Chloride: 110 mEq/L (ref 96–112)
Chloride: 110 mEq/L (ref 96–112)
Chloride: 111 mEq/L (ref 96–112)
Chloride: 112 mEq/L (ref 96–112)
Chloride: 111 mEq/L (ref 96–112)
Chloride: 111 mEq/L (ref 96–112)
Creatinine, Ser: 0.82 mg/dL (ref 0.50–1.35)
Creatinine, Ser: 0.72 mg/dL (ref 0.50–1.35)
Creatinine, Ser: 0.67 mg/dL (ref 0.50–1.35)
Creatinine, Ser: 0.6 mg/dL (ref 0.50–1.35)
Creatinine, Ser: 0.66 mg/dL (ref 0.50–1.35)
Creatinine, Ser: 0.63 mg/dL (ref 0.50–1.35)
Creatinine, Ser: 0.58 mg/dL (ref 0.50–1.35)
GFR calc Af Amer: >90 mL/min (ref >90)
GFR calc Af Amer: >90 mL/min (ref >90)
GFR calc Af Amer: >90 mL/min (ref >90)
GFR calc Af Amer: >90 mL/min (ref >90)
GFR calc Af Amer: >90 mL/min (ref >90)
GFR calc Af Amer: >90 mL/min (ref >90)
GFR calc Af Amer: >90 mL/min (ref >90)
GFR calc non Af Amer: >90 mL/min (ref >90)
GFR calc non Af Amer: >90 mL/min (ref >90)
GFR calc non Af Amer: >90 mL/min (ref >90)
GFR calc non Af Amer: >90 mL/min (ref >90)
GFR calc non Af Amer: >90 mL/min (ref >90)
GFR calc non Af Amer: >90 mL/min (ref >90)
GFR calc non Af Amer: >90 mL/min (ref >90)
Glucose, Bld: 134 mg/dL — ABNORMAL HIGH (ref 70–99)
Glucose, Bld: 128 mg/dL — ABNORMAL HIGH (ref 70–99)
Glucose, Bld: 122 mg/dL — ABNORMAL HIGH (ref 70–99)
Glucose, Bld: 108 mg/dL — ABNORMAL HIGH (ref 70–99)
Glucose, Bld: 93 mg/dL (ref 70–99)
Glucose, Bld: 88 mg/dL (ref 70–99)
Glucose, Bld: 81 mg/dL (ref 70–99)
Potassium: 3.8 mEq/L (ref 3.5–5.1)
Potassium: 3.7 mEq/L (ref 3.5–5.1)
Potassium: 3.7 mEq/L (ref 3.5–5.1)
Potassium: 3.6 mEq/L (ref 3.5–5.1)
Potassium: 3.5 mEq/L (ref 3.5–5.1)
Potassium: 3.3 mEq/L — ABNORMAL LOW (ref 3.5–5.1)
Potassium: 3.6 mEq/L (ref 3.5–5.1)
Sodium: 137 mEq/L (ref 135–145)
Sodium: 139 mEq/L (ref 135–145)
Sodium: 138 mEq/L (ref 135–145)
Sodium: 141 mEq/L (ref 135–145)
Sodium: 141 mEq/L (ref 135–145)
Sodium: 139 mEq/L (ref 135–145)
Sodium: 137 mEq/L (ref 135–145)

## 2011-07-10 LAB — CBC
HCT: 43.6 % (ref 39.0–52.0)
HCT: 42.1 % (ref 39.0–52.0)
Hemoglobin: 14.8 g/dL (ref 13.0–17.0)
Hemoglobin: 14.5 g/dL (ref 13.0–17.0)
MCH: 30.6 pg (ref 26.0–34.0)
MCH: 30.7 pg (ref 26.0–34.0)
MCHC: 33.9 g/dL (ref 30.0–36.0)
MCHC: 34.4 g/dL (ref 30.0–36.0)
MCV: 90.1 fL (ref 78.0–100.0)
MCV: 89 fL (ref 78.0–100.0)
Platelets: 141 10*3/uL — ABNORMAL LOW (ref 150–400)
Platelets: 131 10*3/uL — ABNORMAL LOW (ref 150–400)
RBC: 4.84 MIL/uL (ref 4.22–5.81)
RBC: 4.73 MIL/uL (ref 4.22–5.81)
RDW: 14.1 % (ref 11.5–15.5)
RDW: 13.9 % (ref 11.5–15.5)
WBC: 16.4 10*3/uL — ABNORMAL HIGH (ref 4.0–10.5)
WBC: 16 10*3/uL — ABNORMAL HIGH (ref 4.0–10.5)

## 2011-07-10 LAB — POCT I-STAT 3, ART BLOOD GAS (G3+)
Acid-base deficit: 3 mmol/L — ABNORMAL HIGH (ref 0.0–2.0)
Bicarbonate: 20.2 mEq/L (ref 20.0–24.0)
O2 Saturation: 100 %
Patient temperature: 33
TCO2: 21 mmol/L (ref 0–100)
pCO2 arterial: 24.5 mmHg — ABNORMAL LOW (ref 35.0–45.0)
pH, Arterial: 7.507 — ABNORMAL HIGH (ref 7.350–7.450)
pO2, Arterial: 150 mmHg — ABNORMAL HIGH (ref 80.0–100.0)

## 2011-07-10 LAB — GLUCOSE, CAPILLARY
Glucose-Capillary: 112 mg/dL — ABNORMAL HIGH (ref 70–99)
Glucose-Capillary: 117 mg/dL — ABNORMAL HIGH (ref 70–99)
Glucose-Capillary: 129 mg/dL — ABNORMAL HIGH (ref 70–99)
Glucose-Capillary: 47 mg/dL — ABNORMAL LOW (ref 70–99)
Glucose-Capillary: 72 mg/dL (ref 70–99)
Glucose-Capillary: 79 mg/dL (ref 70–99)
Glucose-Capillary: 81 mg/dL (ref 70–99)
Glucose-Capillary: 84 mg/dL (ref 70–99)
Glucose-Capillary: 89 mg/dL (ref 70–99)
Glucose-Capillary: 92 mg/dL (ref 70–99)
Glucose-Capillary: 92 mg/dL (ref 70–99)
Glucose-Capillary: 99 mg/dL (ref 70–99)

## 2011-07-10 LAB — PROTIME-INR
INR: 1.2 (ref 0.00–1.49)
Prothrombin Time: 15.5 seconds — ABNORMAL HIGH (ref 11.6–15.2)

## 2011-07-10 LAB — CARDIAC PANEL(CRET KIN+CKTOT+MB+TROPI)
CK, MB: 19.2 ng/mL (ref 0.3–4.0)
CK, MB: 20.5 ng/mL (ref 0.3–4.0)
Relative Index: 0.7 (ref 0.0–2.5)
Relative Index: 0.9 (ref 0.0–2.5)
Total CK: 2687 U/L — ABNORMAL HIGH (ref 7–232)
Total CK: 2280 U/L — ABNORMAL HIGH (ref 7–232)
Troponin I: 0.7 ng/mL (ref <0.30)
Troponin I: 0.6 ng/mL (ref <0.30)

## 2011-07-10 LAB — APTT: aPTT: 32 seconds (ref 24–37)

## 2011-07-10 LAB — CULTURE, RESPIRATORY

## 2011-07-10 LAB — CULTURE, RESPIRATORY W GRAM STAIN

## 2011-07-10 SURGERY — LEFT HEART CATHETERIZATION WITH CORONARY ANGIOGRAM
Anesthesia: LOCAL

## 2011-07-10 MED ORDER — SODIUM CHLORIDE 0.9 % IV SOLN
INTRAVENOUS | Status: DC | PRN
  Administered 2011-07-10: 09:00:00 via INTRAVENOUS

## 2011-07-10 MED ORDER — VANCOMYCIN HCL IN DEXTROSE 1-5 GM/200ML-% IV SOLN
1000.0000 mg | Freq: Three times a day (TID) | INTRAVENOUS | Status: DC
  Administered 2011-07-10 – 2011-07-11 (×3): 1000 mg via INTRAVENOUS
  Filled 2011-07-10 (×5): qty 200

## 2011-07-10 MED ORDER — CEFTRIAXONE SODIUM 1 G IJ SOLR
1.0000 g | INTRAMUSCULAR | Status: DC
  Administered 2011-07-10 – 2011-07-11 (×2): 1 g via INTRAVENOUS
  Filled 2011-07-10 (×2): qty 10

## 2011-07-10 NOTE — Progress Notes (Signed)
Spoke with Dr.Brigman, received order not to give ASA Supp. Due to clean coronary arteries

## 2011-07-10 NOTE — Progress Notes (Signed)
Per O2 sats of 100%, RT titrated FiO2 to 40%. Pt tolerating and continues to maintain sat of 100%. RT will follow up with ABG. RT will continue to monitor.

## 2011-07-10 NOTE — Progress Notes (Signed)
Dr.Bice was made aware of CKMB/ Trop values

## 2011-07-10 NOTE — Progress Notes (Signed)
RR titrated per MD order. No other changes at this time. RT will continue to monitor.

## 2011-07-10 NOTE — Progress Notes (Signed)
CRITICAL VALUE ALERT  Critical value received:  Blood culture  Date of notification:  07/09/2010  Time of notification:  1000  Critical value read back:yes  Nurse who received alert:  Carloyn Jaeger, RN  MD notified (1st page):  1000  Time of first page:  1005  MD notified (2nd page):  Time of second page:  Responding MD:  Dr. Craige Cotta  Time MD responded:  [phone]

## 2011-07-10 NOTE — Progress Notes (Signed)
Per ABG and O2 sats, RT titrated FiO2 to 30%. Pt maintaining sats of 100%. RT will continue to monitor.
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
## 2011-07-10 NOTE — Significant Event (Signed)
Pt noted to have blood culture positive for Gram positive cocci.  Clinical suspicion for infection low, so ?contaminate.  Will start vancomycin, rocephin and consider stopping Abx soon pending final culture results.  Coralyn Helling, MD 07/10/2011, 10:20 AM Pager:  [phone]

## 2011-07-10 NOTE — Progress Notes (Signed)
Patient ID: Casey Richard, male   DOB: May 23, 1956, 56 y.o.   MRN: [id number]   Pt with suprapubic tube placed yesterday due to urethral trauma from attempted urethral catheter placement. SP tube draining appropriately with slightly blood tinged urine as expected.  Continue SP tube to drainage for help with medical management.  Pt will require further urologic evaluation if he recovers from other acute medical issues.  Suprapubic tube should be addressed in one month if still indwelling.

## 2011-07-10 NOTE — Progress Notes (Signed)
Infectious Disease Pharmacy     Total Antibiotics Day #1         Day #1 Vancomycin         Day #1 Ceftriaxone             Casey Richard is an 56 y.o. male who presented to [facility] on 07/09/2011 with a chief complaint of  Chief Complaint  Patient presents with  . Cardiac Arrest     Filed Vitals:   07/10/11 1100  BP: 128/89  Pulse: 59  Temp: 91.4 F (33 C)  Resp:     Lab 07/10/11 0800 07/10/11 0422 07/10/11 0218 07/10/11 07/09/11 2234 07/09/11 1300  NA 141 138 139 137 137 --  K 3.6 3.7 -- -- -- --  CL 111 110 110 109 108 --  CO2 18* 18* 19 18* 19 --  GLUCOSE 108* 122* 128* 134* 140* --  BUN 12 12 13 14 15  --  CREATININE 0.60 0.67 0.72 0.82 0.87 --  CALCIUM 7.9* 8.0* 8.2* 8.0* 8.0* --  MG -- -- -- -- -- 1.8  PHOS -- -- -- -- -- 1.6*    Lab 07/10/11 0422 07/10/11 0221 07/09/11 1300 07/09/11 1146  WBC 16.0* 16.4* 14.8* 15.3*    Past Medical History  Diagnosis Date  . Shortness of breath       Recent Results (from the past 720 hour(s))  MRSA PCR SCREENING     Status: Normal   Collection Time   07/09/11  2:51 PM      Component Value Range Status Comment   MRSA by PCR NEGATIVE  NEGATIVE  Final   CULTURE, BLOOD (ROUTINE X 2)     Status: Normal (Preliminary result)   Collection Time   07/09/11  3:08 PM      Component Value Range Status Comment   Specimen Description BLOOD LEFT HAND   Final    Special Requests BOTTLES DRAWN AEROBIC ONLY 5CC   Final    Setup Time [id number]   Final    Culture     Final    Value:        BLOOD CULTURE RECEIVED NO GROWTH TO DATE CULTURE WILL BE HELD FOR 5 DAYS BEFORE ISSUING A FINAL NEGATIVE REPORT   Report Status PENDING   Incomplete   CULTURE, BLOOD (ROUTINE X 2)     Status: Normal (Preliminary result)   Collection Time   07/09/11  3:47 PM      Component Value Range Status Comment   Specimen Description BLOOD LEFT ARM   Final    Special Requests BOTTLES DRAWN AEROBIC ONLY 10CC   Final    Setup Time [id number]   Final     Culture     Final    Value: GRAM POSITIVE COCCI IN CHAINS        BLOOD CULTURE RECEIVED NO GROWTH TO DATE CULTURE WILL BE HELD FOR 5 DAYS BEFORE ISSUING A FINAL NEGATIVE REPORT     Note: Gram Stain Report Called to,Read Back By and Verified With: RN D. SULLIVAN ON 07/10/11 AT 1000 BY TEDAR   Report Status PENDING   Incomplete     Assessment: Blood cultures are positive for gram positive cocci in chains on one of two sets.  Vancomycin to start per pharmacy protocol.  Plan:  Start Vancomycin 1g IV q8h.  Goal trough 15-20 mg/L.  Will follow renal function and cultures with medical team.  Will check a vancomycin trough if therapy to  continue > 4 days or if renal function changes.  Celedonio Miyamoto, PharmD, BCPS [phone]

## 2011-07-10 NOTE — Progress Notes (Signed)
Subjective: Sedated - Intubated, Artic-sun. Pacer-dependent (underlying complete heart block).  Objective: Vital signs in last 24 hours: Temp:  [91.2 F (32.9 C)-96.6 F (35.9 C)] 91.4 F (33 C) (01/20 0800) Pulse Rate:  [37-92] 59  (01/20 0800) Resp:  [0-27] 15  (01/20 0754) BP: (86-174)/(43-110) 117/99 mmHg (01/20 0800) SpO2:  [0 %-100 %] 100 % (01/20 0800) Arterial Line BP: (168-217)/(75-101) 168/80 mmHg (01/19 1900) FiO2 (%):  [29.9 %-100 %] 29.9 % (01/20 0800) Weight:  [79.379 kg (175 lb)-79.833 kg (176 lb)] 79.833 kg (176 lb) (01/20 0500) Weight change:  Last BM Date:  (PTA) Intake/Output from previous day: -3371.7 01/19 0701 - 01/20 0700 In: 1637.1 [I.V.:1627.1; IV Piggyback:10] Out: 5025 [Urine:5025] Intake/Output this shift:    PE:  General: Sedated, on Arctic sun protocol Heart: RRR, paced at 60 (there is underlying complete heart block without ventricular escape) Lungs: clear bilaterally, good breath sounds Abd: scaphoid, soft, non-tender Ext: 2+ pulses, no edema Neuro: sedated on vent   Lab Results:  Basename 07/10/11 0422 07/10/11 0221  WBC 16.0* 16.4*  HGB 14.5 14.8  HCT 42.1 43.6  PLT 131* 141*   BMET  Basename 07/10/11 0422 07/10/11 0218  NA 138 139  K 3.7 3.7  CL 110 110  CO2 18* 19  GLUCOSE 122* 128*  BUN 12 13  CREATININE 0.67 0.72  CALCIUM 8.0* 8.2*    Basename 07/10/11 07/09/11 2000  TROPONINI 0.70* 0.87*    No results found for this basename: CHOL, HDL, LDLCALC, LDLDIRECT, TRIG, CHOLHDL   No results found for this basename: HGBA1C     No results found for this basename: TSH    Hepatic Function Panel  Basename 07/09/11 1300  PROT 6.1  ALBUMIN 3.2*  AST 79*  ALT 134*  ALKPHOS 93  BILITOT 0.6  BILIDIR --  IBILI --   No results found for this basename: CHOL in the last 72 hours No results found for this basename: PROTIME in the last 72 hours    EKG: Orders placed during the hospital encounter of 07/09/11  . EKG  12-LEAD  . EKG 12-LEAD  . EKG 12-LEAD  . EKG 12-LEAD    Studies/Results: Ct Head Wo Contrast  07/09/2011  *RADIOLOGY REPORT*  Clinical Data:  Witnessed collapse, occipital hematoma, intubated  CT HEAD WITHOUT CONTRAST CT CERVICAL SPINE WITHOUT CONTRAST  Technique:  Multidetector CT imaging of the head and cervical spine was performed following the standard protocol without intravenous contrast.  Multiplanar CT image reconstructions of the cervical spine were also generated.  Comparison:  None  CT HEAD  Findings: Motion degraded images.  No evidence of parenchymal hemorrhage or extra-axial fluid collection. No mass lesion, mass effect, or midline shift.  No CT evidence of acute infarction.  Cerebral volume is age appropriate.  No ventriculomegaly.  Partial opacification of the left greater than right maxillary and ethmoid sinuses.  The mastoid air cells are clear.  Extracranial hematoma overlying the right parietal scalp (series 2/image 24).  No underlying osseous abnormality.  No evidence of calvarial fracture.  IMPRESSION: Motion degraded images.  Extracranial hematoma overlying the right parietal scalp.  No evidence of calvarial fracture.  No evidence of acute intracranial abnormality.  CT CERVICAL SPINE  Findings: Straightening of the cervical spine, likely positional.  No evidence of fracture or dislocation.  The vertebral body heights are maintained.  The dens appears intact.  No prevertebral soft tissue swelling.  Mild multilevel degenerative changes.  Visualized thyroid is unremarkable.  Endotracheal  tube.  Visualized lung apices are notable for mild paraseptal emphysematous changes.  IMPRESSION: No evidence of traumatic injury to the cervical spine.  Mild multilevel degenerative changes.  Original Report Authenticated By: Charline Bills, M.D.   Ct Cervical Spine Wo Contrast  07/09/2011  *RADIOLOGY REPORT*  Clinical Data:  Witnessed collapse, occipital hematoma, intubated  CT HEAD WITHOUT CONTRAST  CT CERVICAL SPINE WITHOUT CONTRAST  Technique:  Multidetector CT imaging of the head and cervical spine was performed following the standard protocol without intravenous contrast.  Multiplanar CT image reconstructions of the cervical spine were also generated.  Comparison:  None  CT HEAD  Findings: Motion degraded images.  No evidence of parenchymal hemorrhage or extra-axial fluid collection. No mass lesion, mass effect, or midline shift.  No CT evidence of acute infarction.  Cerebral volume is age appropriate.  No ventriculomegaly.  Partial opacification of the left greater than right maxillary and ethmoid sinuses.  The mastoid air cells are clear.  Extracranial hematoma overlying the right parietal scalp (series 2/image 24).  No underlying osseous abnormality.  No evidence of calvarial fracture.  IMPRESSION: Motion degraded images.  Extracranial hematoma overlying the right parietal scalp.  No evidence of calvarial fracture.  No evidence of acute intracranial abnormality.  CT CERVICAL SPINE  Findings: Straightening of the cervical spine, likely positional.  No evidence of fracture or dislocation.  The vertebral body heights are maintained.  The dens appears intact.  No prevertebral soft tissue swelling.  Mild multilevel degenerative changes.  Visualized thyroid is unremarkable.  Endotracheal tube.  Visualized lung apices are notable for mild paraseptal emphysematous changes.  IMPRESSION: No evidence of traumatic injury to the cervical spine.  Mild multilevel degenerative changes.  Original Report Authenticated By: Charline Bills, M.D.   Portable Chest Xray In Am  07/10/2011  *RADIOLOGY REPORT*  Clinical Data: Shortness of breath  PORTABLE CHEST - 1 VIEW  Comparison: 07/09/2011  Findings: Chronic interstitial markings.  Right lower lobe atelectasis. No pleural effusion or pneumothorax.  Endotracheal tube terminates 6 cm above the carina.  Stable left IJ venous catheter.  Enteric tube terminates in the gastric  cardia.  The heart is normal in size.  IMPRESSION: Endotracheal tube terminates 6 cm above the carina.  Original Report Authenticated By: Charline Bills, M.D.   Dg Chest Portable 1 View  07/09/2011  *RADIOLOGY REPORT*  Clinical Data: ET tube and left central line placement  PORTABLE CHEST - 1 VIEW  Comparison: None.  Findings: Endotracheal tube terminates 5 cm above the carina.  Left IJ venous catheter with its tip at the junction of the left brachiocephalic vein and SVC.  Suspected mild interstitial edema.  Underlying chronic interstitial markings. No pleural effusion or pneumothorax.  The heart is top normal in size.  IMPRESSION: Endotracheal tube terminates 5 cm above the carina.  Left IJ venous catheter at the junction of the left brachiocephalic vein and SVC.  No pneumothorax.  Suspected mild interstitial edema.  Original Report Authenticated By: Charline Bills, M.D.    Medications: I have reviewed the patient's current medications.    Marland Kitchen amiodarone  150 mg Intravenous Once  . artificial tears  1 application Both Eyes Q8H  . atropine  1 mg Intravenous Once  . atropine  1 mg Intravenous Once  . atropine  1 mg Intravenous Once  . chlorhexidine  15 mL Mouth/Throat BID  . chlorhexidine      . cisatracurium  0.1 mg/kg Intravenous Once  . fentaNYL      .  fentaNYL  50 mcg Intravenous Once  . hydrALAZINE      . midazolam      . midazolam  2 mg Intravenous Once  . pantoprazole (PROTONIX) IV  40 mg Intravenous QHS  . DISCONTD: amiodarone (CORDARONE) infusion  60 mg/hr Intravenous Once  . DISCONTD: aspirin  300 mg Rectal NOW   Assessment/Plan: Patient Active Problem List  Diagnoses  . Cardiac arrest - ventricular fibrillation, 07/09/11  . Respiratory arrest, vent dependent  . Complete heart block, requiring external pacing then temp wire pacing.  Supra pubic catheter, placed by urology 07/09/11 Hypertension NSTEMI  PLAN: CK elevated out of proportion to MB and Troponin. Continuing  artic sun protocol. Small NSTEMI without obstructive CAD. LVEF is >65%. ?familial intrinsic conduction system disease (Lenegre's disease) vs. neuromuscular disease such as muscular dystrophy (BMD or DMD)?  A muscle biopsy may be warranted.  Plan re-warming tonight. Based on neurologic recovery, will plan permanent pacemaker on Wednesday.   LOS: 1 day   INGOLD,LAURA R 07/10/2011, 8:23 AM

## 2011-07-11 ENCOUNTER — Inpatient Hospital Stay (HOSPITAL_COMMUNITY): Payer: Medicaid Other

## 2011-07-11 LAB — BASIC METABOLIC PANEL
BUN: 13 mg/dL (ref 6–23)
BUN: 14 mg/dL (ref 6–23)
CO2: 20 mEq/L (ref 19–32)
CO2: 21 mEq/L (ref 19–32)
Calcium: 7.9 mg/dL — ABNORMAL LOW (ref 8.4–10.5)
Calcium: 8 mg/dL — ABNORMAL LOW (ref 8.4–10.5)
Chloride: 109 mEq/L (ref 96–112)
Chloride: 110 mEq/L (ref 96–112)
Creatinine, Ser: 0.67 mg/dL (ref 0.50–1.35)
Creatinine, Ser: 0.7 mg/dL (ref 0.50–1.35)
GFR calc Af Amer: >90 mL/min (ref >90)
GFR calc Af Amer: >90 mL/min (ref >90)
GFR calc non Af Amer: >90 mL/min (ref >90)
GFR calc non Af Amer: >90 mL/min (ref >90)
Glucose, Bld: 102 mg/dL — ABNORMAL HIGH (ref 70–99)
Glucose, Bld: 97 mg/dL (ref 70–99)
Potassium: 3.6 mEq/L (ref 3.5–5.1)
Potassium: 3.9 mEq/L (ref 3.5–5.1)
Sodium: 137 mEq/L (ref 135–145)
Sodium: 139 mEq/L (ref 135–145)

## 2011-07-11 LAB — CBC
HCT: 41.8 % (ref 39.0–52.0)
Hemoglobin: 14.1 g/dL (ref 13.0–17.0)
MCH: 30.6 pg (ref 26.0–34.0)
MCHC: 33.7 g/dL (ref 30.0–36.0)
MCV: 90.7 fL (ref 78.0–100.0)
Platelets: 136 10*3/uL — ABNORMAL LOW (ref 150–400)
RBC: 4.61 MIL/uL (ref 4.22–5.81)
RDW: 14.6 % (ref 11.5–15.5)
WBC: 16.8 10*3/uL — ABNORMAL HIGH (ref 4.0–10.5)

## 2011-07-11 LAB — GLUCOSE, CAPILLARY
Glucose-Capillary: 66 mg/dL — ABNORMAL LOW (ref 70–99)
Glucose-Capillary: 76 mg/dL (ref 70–99)
Glucose-Capillary: 76 mg/dL (ref 70–99)
Glucose-Capillary: 80 mg/dL (ref 70–99)
Glucose-Capillary: 83 mg/dL (ref 70–99)
Glucose-Capillary: 92 mg/dL (ref 70–99)
Glucose-Capillary: 73 mg/dL (ref 70–99)
Glucose-Capillary: 92 mg/dL (ref 70–99)
Glucose-Capillary: 93 mg/dL (ref 70–99)
Glucose-Capillary: 91 mg/dL (ref 70–99)
Glucose-Capillary: 99 mg/dL (ref 70–99)
Glucose-Capillary: 90 mg/dL (ref 70–99)

## 2011-07-11 LAB — POCT I-STAT 3, ART BLOOD GAS (G3+)
Acid-base deficit: 5 mmol/L — ABNORMAL HIGH (ref 0.0–2.0)
Acid-base deficit: 5 mmol/L — ABNORMAL HIGH (ref 0.0–2.0)
Bicarbonate: 20.3 mEq/L (ref 20.0–24.0)
Bicarbonate: 19.5 mEq/L — ABNORMAL LOW (ref 20.0–24.0)
O2 Saturation: 97 %
O2 Saturation: 94 %
Patient temperature: 35.9
Patient temperature: 97.2
TCO2: 21 mmol/L (ref 0–100)
TCO2: 21 mmol/L (ref 0–100)
pCO2 arterial: 34.7 mmHg — ABNORMAL LOW (ref 35.0–45.0)
pCO2 arterial: 32.2 mmHg — ABNORMAL LOW (ref 35.0–45.0)
pH, Arterial: 7.37 (ref 7.350–7.450)
pH, Arterial: 7.388 (ref 7.350–7.450)
pO2, Arterial: 88 mmHg (ref 80.0–100.0)
pO2, Arterial: 68 mmHg — ABNORMAL LOW (ref 80.0–100.0)

## 2011-07-11 LAB — POCT I-STAT, CHEM 8
BUN: 15 mg/dL (ref 6–23)
Calcium, Ion: 1.14 mmol/L (ref 1.12–1.32)
Chloride: 110 mEq/L (ref 96–112)
Creatinine, Ser: 0.9 mg/dL (ref 0.50–1.35)
Glucose, Bld: 112 mg/dL — ABNORMAL HIGH (ref 70–99)
HCT: 40 % (ref 39.0–52.0)
Hemoglobin: 13.6 g/dL (ref 13.0–17.0)
Potassium: 4 mEq/L (ref 3.5–5.1)
Sodium: 141 mEq/L (ref 135–145)
TCO2: 20 mmol/L (ref 0–100)

## 2011-07-11 MED ORDER — BIOTENE DRY MOUTH MT LIQD
15.0000 mL | Freq: Four times a day (QID) | OROMUCOSAL | Status: DC
  Administered 2011-07-11: 15 mL via OROMUCOSAL

## 2011-07-11 MED ORDER — SODIUM CHLORIDE 0.9 % IJ SOLN
3.0000 mL | Freq: Two times a day (BID) | INTRAMUSCULAR | Status: DC
  Administered 2011-07-11 – 2011-07-13 (×6): 3 mL via INTRAVENOUS
  Administered 2011-07-13: 22:00:00 via INTRAVENOUS
  Administered 2011-07-14 (×2): 3 mL via INTRAVENOUS

## 2011-07-11 MED ORDER — FUROSEMIDE 10 MG/ML IJ SOLN
20.0000 mg | Freq: Two times a day (BID) | INTRAMUSCULAR | Status: DC
  Administered 2011-07-11 (×2): 20 mg via INTRAVENOUS
  Filled 2011-07-11 (×4): qty 2

## 2011-07-11 MED ORDER — DEXTROSE 5 % IV SOLN
2.0000 g | INTRAVENOUS | Status: DC
  Administered 2011-07-12 – 2011-07-14 (×3): 2 g via INTRAVENOUS
  Filled 2011-07-11 (×4): qty 2

## 2011-07-11 MED ORDER — DEXTROSE 5 % IV SOLN
1.0000 g | Freq: Once | INTRAVENOUS | Status: AC
  Administered 2011-07-11: 1 g via INTRAVENOUS
  Filled 2011-07-11: qty 10

## 2011-07-11 MED ORDER — SODIUM CHLORIDE 0.9 % IJ SOLN
10.0000 mL | Freq: Two times a day (BID) | INTRAMUSCULAR | Status: DC
  Administered 2011-07-11 – 2011-07-14 (×8): 10 mL via INTRAVENOUS

## 2011-07-11 MED ORDER — DEXTROSE 50 % IV SOLN
INTRAVENOUS | Status: AC
  Administered 2011-07-11: 50 mL
  Filled 2011-07-11: qty 50

## 2011-07-11 MED FILL — Lidocaine HCl Local Preservative Free (PF) Inj 1%: INTRAMUSCULAR | Qty: 30 | Status: AC

## 2011-07-11 MED FILL — Heparin Sodium (Porcine) 2 Unit/ML in Sodium Chloride 0.9%: INTRAMUSCULAR | Qty: 2000 | Status: AC

## 2011-07-11 NOTE — Progress Notes (Signed)
Orthopedic Tech Progress Note Patient Details:  Casey Richard 1956/04/30 [id number]  Other Ortho Devices Type of Ortho Device: Knee Immobilizer Ortho Device Interventions: Application   Cammer, Mickie Bail 07/11/2011, 7:24 AM

## 2011-07-11 NOTE — Progress Notes (Signed)
Subjective: Sedated, vent depend.  Objective: Vital signs in last 24 hours: Temp:  [91.4 F (33 C)-97.7 F (36.5 C)] 97.7 F (36.5 C) (01/21 0600) Pulse Rate:  [58-60] 59  (01/21 0515) Resp:  [15] 15  (01/21 0413) BP: (99-147)/(64-99) 108/69 mmHg (01/21 0500) SpO2:  [99 %-100 %] 100 % (01/21 0515) FiO2 (%):  [29.7 %-30.5 %] 30.2 % (01/21 0515) Weight change:  Last BM Date:  (PTA) Intake/Output from previous day: +636.9 01/20 0701 - 01/21 0700 In: 2018.1 [I.V.:1358.1; IV Piggyback:660] Out: 1365 [Urine:1315; Emesis/NG output:50] Intake/Output this shift: Total I/O In: 1161.3 [I.V.:751.3; IV Piggyback:410] Out: 550 [Urine:500; Emesis/NG output:50]  PE:  General: Awake, intubated, follows commands Neuro: Oriented, able to write intentions Heart: V-paced (there is underlying complete heart block with a slow ventricular escape today in the 20's) Lungs: diminished breath sounds, crackles bibasillary Abd: Soft, non-tender, +BS Ext: No edema    Lab Results:  Basename 07/11/11 0400 07/10/11 0422  WBC 16.8* 16.0*  HGB 14.1 14.5  HCT 41.8 42.1  PLT 136* 131*   BMET  Basename 07/11/11 0400 07/10/11 2345  NA 139 137  K 3.9 3.6  CL 110 109  CO2 21 20  GLUCOSE 97 102*  BUN 14 13  CREATININE 0.70 0.67  CALCIUM 7.9* 8.0*    Basename 07/10/11 0800 07/10/11  TROPONINI 0.60* 0.70*    No results found for this basename: CHOL, HDL, LDLCALC, LDLDIRECT, TRIG, CHOLHDL   No results found for this basename: HGBA1C     No results found for this basename: TSH    Hepatic Function Panel  Basename 07/09/11 1300  PROT 6.1  ALBUMIN 3.2*  AST 79*  ALT 134*  ALKPHOS 93  BILITOT 0.6  BILIDIR --  IBILI --   No results found for this basename: CHOL in the last 72 hours No results found for this basename: PROTIME in the last 72 hours    EKG: Orders placed in visit on 07/09/11  . EKG 12-LEAD    Studies/Results: Ct Head Wo Contrast  07/09/2011  *RADIOLOGY REPORT*   Clinical Data:  Witnessed collapse, occipital hematoma, intubated  CT HEAD WITHOUT CONTRAST CT CERVICAL SPINE WITHOUT CONTRAST  Technique:  Multidetector CT imaging of the head and cervical spine was performed following the standard protocol without intravenous contrast.  Multiplanar CT image reconstructions of the cervical spine were also generated.  Comparison:  None  CT HEAD  Findings: Motion degraded images.  No evidence of parenchymal hemorrhage or extra-axial fluid collection. No mass lesion, mass effect, or midline shift.  No CT evidence of acute infarction.  Cerebral volume is age appropriate.  No ventriculomegaly.  Partial opacification of the left greater than right maxillary and ethmoid sinuses.  The mastoid air cells are clear.  Extracranial hematoma overlying the right parietal scalp (series 2/image 24).  No underlying osseous abnormality.  No evidence of calvarial fracture.  IMPRESSION: Motion degraded images.  Extracranial hematoma overlying the right parietal scalp.  No evidence of calvarial fracture.  No evidence of acute intracranial abnormality.  CT CERVICAL SPINE  Findings: Straightening of the cervical spine, likely positional.  No evidence of fracture or dislocation.  The vertebral body heights are maintained.  The dens appears intact.  No prevertebral soft tissue swelling.  Mild multilevel degenerative changes.  Visualized thyroid is unremarkable.  Endotracheal tube.  Visualized lung apices are notable for mild paraseptal emphysematous changes.  IMPRESSION: No evidence of traumatic injury to the cervical spine.  Mild multilevel degenerative changes.  Original Report Authenticated By: Charline Bills, M.D.   Ct Cervical Spine Wo Contrast  07/09/2011  *RADIOLOGY REPORT*  Clinical Data:  Witnessed collapse, occipital hematoma, intubated  CT HEAD WITHOUT CONTRAST CT CERVICAL SPINE WITHOUT CONTRAST  Technique:  Multidetector CT imaging of the head and cervical spine was performed following the  standard protocol without intravenous contrast.  Multiplanar CT image reconstructions of the cervical spine were also generated.  Comparison:  None  CT HEAD  Findings: Motion degraded images.  No evidence of parenchymal hemorrhage or extra-axial fluid collection. No mass lesion, mass effect, or midline shift.  No CT evidence of acute infarction.  Cerebral volume is age appropriate.  No ventriculomegaly.  Partial opacification of the left greater than right maxillary and ethmoid sinuses.  The mastoid air cells are clear.  Extracranial hematoma overlying the right parietal scalp (series 2/image 24).  No underlying osseous abnormality.  No evidence of calvarial fracture.  IMPRESSION: Motion degraded images.  Extracranial hematoma overlying the right parietal scalp.  No evidence of calvarial fracture.  No evidence of acute intracranial abnormality.  CT CERVICAL SPINE  Findings: Straightening of the cervical spine, likely positional.  No evidence of fracture or dislocation.  The vertebral body heights are maintained.  The dens appears intact.  No prevertebral soft tissue swelling.  Mild multilevel degenerative changes.  Visualized thyroid is unremarkable.  Endotracheal tube.  Visualized lung apices are notable for mild paraseptal emphysematous changes.  IMPRESSION: No evidence of traumatic injury to the cervical spine.  Mild multilevel degenerative changes.  Original Report Authenticated By: Charline Bills, M.D.   Portable Chest Xray In Am  07/10/2011  *RADIOLOGY REPORT*  Clinical Data: Shortness of breath  PORTABLE CHEST - 1 VIEW  Comparison: 07/09/2011  Findings: Chronic interstitial markings.  Right lower lobe atelectasis. No pleural effusion or pneumothorax.  Endotracheal tube terminates 6 cm above the carina.  Stable left IJ venous catheter.  Enteric tube terminates in the gastric cardia.  The heart is normal in size.  IMPRESSION: Endotracheal tube terminates 6 cm above the carina.  Original Report  Authenticated By: Charline Bills, M.D.   Dg Chest Portable 1 View  07/09/2011  *RADIOLOGY REPORT*  Clinical Data: ET tube and left central line placement  PORTABLE CHEST - 1 VIEW  Comparison: None.  Findings: Endotracheal tube terminates 5 cm above the carina.  Left IJ venous catheter with its tip at the junction of the left brachiocephalic vein and SVC.  Suspected mild interstitial edema.  Underlying chronic interstitial markings. No pleural effusion or pneumothorax.  The heart is top normal in size.  IMPRESSION: Endotracheal tube terminates 5 cm above the carina.  Left IJ venous catheter at the junction of the left brachiocephalic vein and SVC.  No pneumothorax.  Suspected mild interstitial edema.  Original Report Authenticated By: Charline Bills, M.D.    Medications: I have reviewed the patient's current medications.    Marland Kitchen amiodarone  150 mg Intravenous Once  . antiseptic oral rinse  15 mL Mouth Rinse QID  . artificial tears  1 application Both Eyes Q8H  . cefTRIAXone (ROCEPHIN)  IV  1 g Intravenous Q24H  . chlorhexidine  15 mL Mouth/Throat BID  . dextrose      . pantoprazole (PROTONIX) IV  40 mg Intravenous QHS  . sodium chloride  10 mL Intravenous BID  . sodium chloride  3 mL Intravenous Q12H  . vancomycin  1,000 mg Intravenous Q8H   Assessment/Plan: Patient Active Problem List  Diagnoses  .  Cardiac arrest - ventricular fibrillation, 07/09/11  . Respiratory arrest, vent dependent  . Complete heart block, requiring external pacing then temp wire pacing.  . NSTEMI (non-ST elevated myocardial infarction)  Gram + cocci in chains with blood culture.  On one of two sets, now on Vancomycin and Rocephin. Suprapubic catheter due to urethral trauma with attempted urethral catheter placement.  PLAN: Continues on fentanyl, versed, cisatracurium, and norepinephrine drips. Temp Pacemaker in place, awaiting PCXR from this am to see placement.  Underlying pwaves and ventricular asystole.  Plan  for perm. Pacemaker, but with + blood cultures, may need to hold the permanent pacer.   Peak troponin 0.96, peak CK 2687, MB 20.5.   LOS: 2 days   INGOLD,LAURA R 07/11/2011, 6:22 AM  Pt. Seen and examined. Agree with the NP/PA-C note as written. He is awake today and may be able to be extubated. His wife reports that his brother apparently had a pacemaker placed recently after he passed out for similar symptoms. 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 It would be unlikely for both to have similar acquired conduction system disease. There are some abnormal x-ray findings which may be due to COPD, but I would rule-out sarcoidosis. I agree with a hi-Res CT scan. I would also send studies for hemochromatosis given his LFT and CK abnormalities. I'm less inclined at this point to think this is a muscular dystrophy, however a muscle biopsy would rule that out. Finally, I'm interested in whether his urethral abnormalities (hypospadia)? May be connected to an underlying genetic disorder that led to this.  Tentatively, we are still planning pacemaker on Wednesday, but will continue to re-assess his underlying cardiac rhythm.  Chrystie Nose, MD Attending Cardiologist [facility]
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
## 2011-07-11 NOTE — Progress Notes (Signed)
Pt extubated per md order.  Placed on 4L [place].  Pt tolerated well.  SPO2 100% , HR 60 RR 20.  Pt able to verbalize upon extubation.  RT to monitor

## 2011-07-12 ENCOUNTER — Inpatient Hospital Stay (HOSPITAL_COMMUNITY): Payer: Medicaid Other

## 2011-07-12 ENCOUNTER — Encounter (HOSPITAL_COMMUNITY): Payer: Self-pay | Admitting: Radiology

## 2011-07-12 DIAGNOSIS — J9600 Acute respiratory failure, unspecified whether with hypoxia or hypercapnia: Secondary | ICD-10-CM

## 2011-07-12 DIAGNOSIS — R7881 Bacteremia: Secondary | ICD-10-CM

## 2011-07-12 DIAGNOSIS — A491 Streptococcal infection, unspecified site: Secondary | ICD-10-CM

## 2011-07-12 DIAGNOSIS — I469 Cardiac arrest, cause unspecified: Secondary | ICD-10-CM

## 2011-07-12 LAB — DIFFERENTIAL
Basophils Absolute: 0 10*3/uL (ref 0.0–0.1)
Basophils Relative: 0 % (ref 0–1)
Eosinophils Absolute: 0 10*3/uL (ref 0.0–0.7)
Eosinophils Relative: 0 % (ref 0–5)
Lymphocytes Relative: 8 % — ABNORMAL LOW (ref 12–46)
Lymphs Abs: 1.1 10*3/uL (ref 0.7–4.0)
Monocytes Absolute: 1.2 10*3/uL — ABNORMAL HIGH (ref 0.1–1.0)
Monocytes Relative: 9 % (ref 3–12)
Neutro Abs: 11.1 10*3/uL — ABNORMAL HIGH (ref 1.7–7.7)
Neutrophils Relative %: 82 % — ABNORMAL HIGH (ref 43–77)

## 2011-07-12 LAB — HEPATIC FUNCTION PANEL
ALT: 63 U/L — ABNORMAL HIGH (ref 0–53)
AST: 36 U/L (ref 0–37)
Albumin: 2.5 g/dL — ABNORMAL LOW (ref 3.5–5.2)
Alkaline Phosphatase: 77 U/L (ref 39–117)
Bilirubin, Direct: 0.1 mg/dL (ref 0.0–0.3)
Indirect Bilirubin: 0.5 mg/dL (ref 0.3–0.9)
Total Bilirubin: 0.6 mg/dL (ref 0.3–1.2)
Total Protein: 5.8 g/dL — ABNORMAL LOW (ref 6.0–8.3)

## 2011-07-12 LAB — CK TOTAL AND CKMB (NOT AT ARMC)
CK, MB: 4.6 ng/mL — ABNORMAL HIGH (ref 0.3–4.0)
Relative Index: 0.7 (ref 0.0–2.5)
Total CK: 665 U/L — ABNORMAL HIGH (ref 7–232)

## 2011-07-12 LAB — PHOSPHORUS: Phosphorus: 2.8 mg/dL (ref 2.3–4.6)

## 2011-07-12 LAB — SEDIMENTATION RATE: Sed Rate: 35 mm/hr — ABNORMAL HIGH (ref 0–16)

## 2011-07-12 LAB — CBC
HCT: 37.4 % — ABNORMAL LOW (ref 39.0–52.0)
Hemoglobin: 12.6 g/dL — ABNORMAL LOW (ref 13.0–17.0)
MCH: 30.7 pg (ref 26.0–34.0)
MCHC: 33.7 g/dL (ref 30.0–36.0)
MCV: 91.2 fL (ref 78.0–100.0)
Platelets: 143 10*3/uL — ABNORMAL LOW (ref 150–400)
RBC: 4.1 MIL/uL — ABNORMAL LOW (ref 4.22–5.81)
RDW: 14.6 % (ref 11.5–15.5)
WBC: 13.5 10*3/uL — ABNORMAL HIGH (ref 4.0–10.5)

## 2011-07-12 LAB — BASIC METABOLIC PANEL
BUN: 16 mg/dL (ref 6–23)
CO2: 26 mEq/L (ref 19–32)
Calcium: 8.1 mg/dL — ABNORMAL LOW (ref 8.4–10.5)
Chloride: 104 mEq/L (ref 96–112)
Creatinine, Ser: 0.98 mg/dL (ref 0.50–1.35)
GFR calc Af Amer: >90 mL/min (ref >90)
GFR calc non Af Amer: 90 mL/min — ABNORMAL LOW (ref >90)
Glucose, Bld: 114 mg/dL — ABNORMAL HIGH (ref 70–99)
Potassium: 3.7 mEq/L (ref 3.5–5.1)
Sodium: 138 mEq/L (ref 135–145)

## 2011-07-12 LAB — B. BURGDORFI ANTIBODIES: B burgdorferi Ab IgG+IgM: 0.13 {ISR}

## 2011-07-12 LAB — MAGNESIUM: Magnesium: 1.9 mg/dL (ref 1.5–2.5)

## 2011-07-12 LAB — PSA: PSA: 0.74 ng/mL (ref <=4.00)

## 2011-07-12 MED ORDER — POTASSIUM CHLORIDE CRYS ER 20 MEQ PO TBCR
40.0000 meq | EXTENDED_RELEASE_TABLET | Freq: Once | ORAL | Status: AC
  Administered 2011-07-12: 40 meq via ORAL
  Filled 2011-07-12: qty 2

## 2011-07-12 MED ORDER — GENTAMICIN IN SALINE 1.6-0.9 MG/ML-% IV SOLN
80.0000 mg | Freq: Three times a day (TID) | INTRAVENOUS | Status: DC
  Administered 2011-07-12 – 2011-07-14 (×7): 80 mg via INTRAVENOUS
  Filled 2011-07-12 (×9): qty 50

## 2011-07-12 MED ORDER — IOHEXOL 300 MG/ML  SOLN
80.0000 mL | Freq: Once | INTRAMUSCULAR | Status: AC | PRN
  Administered 2011-07-12: 80 mL via INTRAVENOUS

## 2011-07-12 MED ORDER — GENTAMICIN SULFATE 40 MG/ML IJ SOLN: 5.0000 mg/kg | Freq: Once | INTRAVENOUS | Status: DC

## 2011-07-12 MED ORDER — FUROSEMIDE 10 MG/ML IJ SOLN
20.0000 mg | Freq: Every day | INTRAMUSCULAR | Status: DC
  Administered 2011-07-12 – 2011-07-14 (×3): 20 mg via INTRAVENOUS
  Filled 2011-07-12 (×4): qty 2

## 2011-07-12 MED ORDER — GENTAMICIN SULFATE 40 MG/ML IJ SOLN
1.0000 mg/kg | Freq: Three times a day (TID) | INTRAVENOUS | Status: DC
  Filled 2011-07-12 (×2): qty 1.88

## 2011-07-12 MED FILL — Medication: Qty: 1 | Status: AC

## 2011-07-12 NOTE — Progress Notes (Signed)
UR Completed.  Casey Richard Jane [phone] 05/21/2012  

## 2011-07-12 NOTE — Consult Note (Addendum)
[facility] CONSULT NOTE     Reason for Consult: weakness and possible NMD CC: weakness and possible NMD   HPI:    Casey Richard is an 56 y.o. male who is a poor historian.  Majority of history obtained from chart. 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 Fire on scence within 2 minutes with AED that advised shock. EMS arrived and intubation in filed was performed. Transported to [facility] ED with recurrent VFIB tx with Amiodarone 150 mg bolus. Developed complete heart block and was tx to Cath Lab for pacing wire and cardiac catheterization. While in the hospital patient had blood cultures drawn showing positive cocci in chains.  At this time pCCM is R/O endocarditis, ring abscess with conduction delay.  Neurology was consulted to give input on possible neuromuscular disorder.  There is question of brother having neuromuscular disorder but patient cannot give any history on this.  He also states 3 years ago he had difficulty combing his hair.  Past Medical History  Diagnosis Date  . Shortness of breath     Past Surgical History  Procedure Date  . No past surgeries     Family History  Problem Relation Age of Onset  . Coronary artery disease Mother   . COPD Father   . Coronary artery disease Brother     Social History:  reports that he quit smoking about 22 years ago. He does not have any smokeless tobacco history on file. He reports that he does not drink alcohol or use illicit drugs.  No Known Allergies  Medications:    Scheduled:   . cefTRIAXone (ROCEPHIN)  IV  1 g Intravenous Once  . cefTRIAXone (ROCEPHIN)  IV  2 g Intravenous Q24H  . chlorhexidine  15 mL Mouth/Throat BID  . furosemide  20 mg Intravenous Daily  . gentamicin  80 mg Intravenous Q8H  . potassium chloride  40 mEq Oral Once  . sodium chloride  10 mL Intravenous BID  . sodium chloride  3 mL Intravenous Q12H   . DISCONTD: cefTRIAXone (ROCEPHIN)  IV  1 g Intravenous Q24H  . DISCONTD: furosemide  20 mg Intravenous Q12H  . DISCONTD: gentamicin  5 mg/kg Intravenous Once  . DISCONTD: gentamicin  1 mg/kg (Ideal) Intravenous Q8H  . DISCONTD: pantoprazole (PROTONIX) IV  40 mg Intravenous QHS    Review of Systems - General ROS: negative for - chills, fatigue, fever or hot flashes Hematological and Lymphatic ROS: negative for - bruising, fatigue, jaundice or pallor Endocrine ROS: negative for - hair pattern changes, hot flashes, mood swings or skin changes Respiratory ROS: negative for - cough, hemoptysis, orthopnea or wheezing Cardiovascular ROS: negative for - dyspnea on exertion, orthopnea, palpitations or shortness of breath Gastrointestinal ROS: negative for - abdominal pain, appetite loss, blood in stools, diarrhea or hematemesis Musculoskeletal ROS: negative for - joint pain, joint stiffness, joint swelling or muscle pain Neurological ROS: positive for - weakness and chest twitching Dermatological ROS: negative for dry skin, pruritus and rash   Blood pressure 108/69, pulse 59, temperature 98.1 F (36.7 C), temperature source Oral, resp. rate 15, height 5\' 11"  (1.803 m), weight 77.9 kg (171 lb 11.8 oz), SpO2 100.00%.   Neurologic Examination:   Mental Status: Alert, oriented, thought content appropriate.  Speech fluent without evidence  of aphasia.  Able to follow 3 step commands without difficulty. Cranial Nerves: II: visual fields grossly normal, pupils equal, round, reactive to light and accommodation III,IV, VI: ptosis not present, extraocular muscles extra-ocular motions intact bilaterally V,VII: smile symmetric, facial light touch sensation normal bilaterally VIII: hearing normal bilaterally IX,X: gag reflex present XI: trapezius strength/neck flexion strength normal bilaterally XII: tongue strength normal  Motor: Right : Upper extremity    Left:     Upper extremity 5/5 deltoid         5/5 deltoid 5/5 tricep      5/5 tricep 5/5 biceps      5/5 biceps  5/5wrist flexion     5/5 wrist flexion 5/5 wrist extension     5/5 wrist extension 5/5 hand grip      5/5 hand grip  Lower extremity     Lower extremity 4/5 hip flexor      4/5 hip flexor 5/5 hip adductors     5/5 hip adductors 5/5 hip abductors     5/5 hip abductors 5/5 quadricep      5/5 quadriceps  5/5 hamstrings     5/5 hamstrings 5/5 plantar flexion       5/5 plantar flexion 5/5 plantar extension     5/5 plantar extension Showed fasciculations in the medial aspects of his legs bilaterally both at rest and when activated, he also showed pectoralis fasciculations at rest. Sensory: Pinprick and light touch intact throughout, bilaterally Deep Tendon Reflexes: 2+ and symmetric throughout Plantars: Right: downgoing   Left: downgoing Cerebellar: normal finger-to-nose  No results found for this basename: cbc, bmp, coags, chol, tri, ldl, hga1c    Results for orders placed during the hospital encounter of 07/09/11 (from the past 48 hour(s))  GLUCOSE, CAPILLARY     Status: Normal   Collection Time   07/10/11 11:03 AM      Component Value Range Comment   Glucose-Capillary 89  70 - 99 (mg/dL)   BASIC METABOLIC PANEL     Status: Abnormal   Collection Time   07/10/11 11:55 AM      Component Value Range Comment   Sodium 141  135 - 145 (mEq/L)    Potassium 3.5  3.5 - 5.1 (mEq/L)    Chloride 112  96 - 112 (mEq/L)    CO2 20  19 - 32 (mEq/L)    Glucose, Bld 93  70 - 99 (mg/dL)    BUN 13  6 - 23 (mg/dL)    Creatinine, Ser 9.56  0.50 - 1.35 (mg/dL)    Calcium 8.0 (*) 8.4 - 10.5 (mg/dL)    GFR calc non Af Amer >90  >90 (mL/min)    GFR calc Af Amer >90  >90 (mL/min)   CULTURE, RESPIRATORY     Status: Normal   Collection Time   07/10/11 12:48 PM      Component Value Range Comment   Specimen Description TRACHEAL ASPIRATE      Special Requests NONE      Gram Stain        Value: ABUNDANT WBC PRESENT,BOTH PMN AND MONONUCLEAR      NO SQUAMOUS EPITHELIAL CELLS SEEN     ABUNDANT GRAM POSITIVE RODS     FEW GRAM NEGATIVE COCCOBACILLI   Culture Non-Pathogenic Oropharyngeal-type Flora Isolated.      Report Status 07/12/2011 FINAL     GLUCOSE, CAPILLARY     Status: Abnormal   Collection Time   07/10/11  1:03 PM  Component Value Range Comment   Glucose-Capillary 112 (*) 70 - 99 (mg/dL)   GLUCOSE, CAPILLARY     Status: Normal   Collection Time   07/10/11  3:01 PM      Component Value Range Comment   Glucose-Capillary 81  70 - 99 (mg/dL)   BASIC METABOLIC PANEL     Status: Abnormal   Collection Time   07/10/11  4:00 PM      Component Value Range Comment   Sodium 139  135 - 145 (mEq/L)    Potassium 3.3 (*) 3.5 - 5.1 (mEq/L)    Chloride 111  96 - 112 (mEq/L)    CO2 20  19 - 32 (mEq/L)    Glucose, Bld 88  70 - 99 (mg/dL)    BUN 12  6 - 23 (mg/dL)    Creatinine, Ser 1.61  0.50 - 1.35 (mg/dL)    Calcium 8.0 (*) 8.4 - 10.5 (mg/dL)    GFR calc non Af Amer >90  >90 (mL/min)    GFR calc Af Amer >90  >90 (mL/min)   GLUCOSE, CAPILLARY     Status: Abnormal   Collection Time   07/10/11  4:59 PM      Component Value Range Comment   Glucose-Capillary 47 (*) 70 - 99 (mg/dL)    Comment 1 Repeat Test     GLUCOSE, CAPILLARY     Status: Normal   Collection Time   07/10/11  5:01 PM      Component Value Range Comment   Glucose-Capillary 79  70 - 99 (mg/dL)   GLUCOSE, CAPILLARY     Status: Normal   Collection Time   07/10/11  6:54 PM      Component Value Range Comment   Glucose-Capillary 76  70 - 99 (mg/dL)   GLUCOSE, CAPILLARY     Status: Normal   Collection Time   07/10/11  7:46 PM      Component Value Range Comment   Glucose-Capillary 80  70 - 99 (mg/dL)   BASIC METABOLIC PANEL     Status: Abnormal   Collection Time   07/10/11  8:00 PM      Component Value Range Comment   Sodium 137  135 - 145 (mEq/L)    Potassium 3.6  3.5 - 5.1 (mEq/L)    Chloride 111  96 - 112 (mEq/L)    CO2 18 (*) 19 - 32 (mEq/L)    Glucose,  Bld 81  70 - 99 (mg/dL)    BUN 13  6 - 23 (mg/dL)    Creatinine, Ser 0.96  0.50 - 1.35 (mg/dL)    Calcium 8.0 (*) 8.4 - 10.5 (mg/dL)    GFR calc non Af Amer >90  >90 (mL/min)    GFR calc Af Amer >90  >90 (mL/min)   GLUCOSE, CAPILLARY     Status: Normal   Collection Time   07/10/11 10:48 PM      Component Value Range Comment   Glucose-Capillary 72  70 - 99 (mg/dL)   GLUCOSE, CAPILLARY     Status: Normal   Collection Time   07/10/11 11:42 PM      Component Value Range Comment   Glucose-Capillary 92  70 - 99 (mg/dL)   BASIC METABOLIC PANEL     Status: Abnormal   Collection Time   07/10/11 11:45 PM      Component Value Range Comment   Sodium 137  135 - 145 (mEq/L)    Potassium 3.6  3.5 -  5.1 (mEq/L)    Chloride 109  96 - 112 (mEq/L)    CO2 20  19 - 32 (mEq/L)    Glucose, Bld 102 (*) 70 - 99 (mg/dL)    BUN 13  6 - 23 (mg/dL)    Creatinine, Ser 1.19  0.50 - 1.35 (mg/dL)    Calcium 8.0 (*) 8.4 - 10.5 (mg/dL)    GFR calc non Af Amer >90  >90 (mL/min)    GFR calc Af Amer >90  >90 (mL/min)   GLUCOSE, CAPILLARY     Status: Abnormal   Collection Time   07/11/11  1:24 AM      Component Value Range Comment   Glucose-Capillary 66 (*) 70 - 99 (mg/dL)   GLUCOSE, CAPILLARY     Status: Normal   Collection Time   07/11/11  1:51 AM      Component Value Range Comment   Glucose-Capillary 83  70 - 99 (mg/dL)   GLUCOSE, CAPILLARY     Status: Normal   Collection Time   07/11/11  2:44 AM      Component Value Range Comment   Glucose-Capillary 76  70 - 99 (mg/dL)   GLUCOSE, CAPILLARY     Status: Normal   Collection Time   07/11/11  3:25 AM      Component Value Range Comment   Glucose-Capillary 73  70 - 99 (mg/dL)   BASIC METABOLIC PANEL     Status: Abnormal   Collection Time   07/11/11  4:00 AM      Component Value Range Comment   Sodium 139  135 - 145 (mEq/L)    Potassium 3.9  3.5 - 5.1 (mEq/L)    Chloride 110  96 - 112 (mEq/L)    CO2 21  19 - 32 (mEq/L)    Glucose, Bld 97  70 - 99 (mg/dL)     BUN 14  6 - 23 (mg/dL)    Creatinine, Ser 1.47  0.50 - 1.35 (mg/dL)    Calcium 7.9 (*) 8.4 - 10.5 (mg/dL)    GFR calc non Af Amer >90  >90 (mL/min)    GFR calc Af Amer >90  >90 (mL/min)   CBC     Status: Abnormal   Collection Time   07/11/11  4:00 AM      Component Value Range Comment   WBC 16.8 (*) 4.0 - 10.5 (K/uL)    RBC 4.61  4.22 - 5.81 (MIL/uL)    Hemoglobin 14.1  13.0 - 17.0 (g/dL)    HCT 82.9  56.2 - 13.0 (%)    MCV 90.7  78.0 - 100.0 (fL)    MCH 30.6  26.0 - 34.0 (pg)    MCHC 33.7  30.0 - 36.0 (g/dL)    RDW 86.5  78.4 - 69.6 (%)    Platelets 136 (*) 150 - 400 (K/uL)   GLUCOSE, CAPILLARY     Status: Normal   Collection Time   07/11/11  4:24 AM      Component Value Range Comment   Glucose-Capillary 93  70 - 99 (mg/dL)   POCT I-STAT 3, BLOOD GAS (G3+)     Status: Abnormal   Collection Time   07/11/11  4:27 AM      Component Value Range Comment   pH, Arterial 7.370  7.350 - 7.450     pCO2 arterial 34.7 (*) 35.0 - 45.0 (mmHg)    pO2, Arterial 88.0  80.0 - 100.0 (mmHg)    Bicarbonate 20.3  20.0 - 24.0 (mEq/L)    TCO2 21  0 - 100 (mmol/L)    O2 Saturation 97.0      Acid-base deficit 5.0 (*) 0.0 - 2.0 (mmol/L)    Patient temperature 35.9 C      Sample type ARTERIAL     GLUCOSE, CAPILLARY     Status: Normal   Collection Time   07/11/11  6:01 AM      Component Value Range Comment   Glucose-Capillary 92  70 - 99 (mg/dL)   GLUCOSE, CAPILLARY     Status: Normal   Collection Time   07/11/11  7:53 AM      Component Value Range Comment   Glucose-Capillary 91  70 - 99 (mg/dL)   POCT I-STAT 3, BLOOD GAS (G3+)     Status: Abnormal   Collection Time   07/11/11  9:02 AM      Component Value Range Comment   pH, Arterial 7.388  7.350 - 7.450     pCO2 arterial 32.2 (*) 35.0 - 45.0 (mmHg)    pO2, Arterial 68.0 (*) 80.0 - 100.0 (mmHg)    Bicarbonate 19.5 (*) 20.0 - 24.0 (mEq/L)    TCO2 21  0 - 100 (mmol/L)    O2 Saturation 94.0      Acid-base deficit 5.0 (*) 0.0 - 2.0 (mmol/L)     Patient temperature 97.2 F      Collection site RADIAL, ALLEN'S TEST ACCEPTABLE      Sample type ARTERIAL     POCT I-STAT, CHEM 8     Status: Abnormal   Collection Time   07/11/11  9:10 AM      Component Value Range Comment   Sodium 141  135 - 145 (mEq/L)    Potassium 4.0  3.5 - 5.1 (mEq/L)    Chloride 110  96 - 112 (mEq/L)    BUN 15  6 - 23 (mg/dL)    Creatinine, Ser 4.09  0.50 - 1.35 (mg/dL)    Glucose, Bld 811 (*) 70 - 99 (mg/dL)    Calcium, Ion 9.14  1.12 - 1.32 (mmol/L)    TCO2 20  0 - 100 (mmol/L)    Hemoglobin 13.6  13.0 - 17.0 (g/dL)    HCT 78.2  95.6 - 21.3 (%)   CULTURE, BLOOD (ROUTINE X 2)     Status: Normal (Preliminary result)   Collection Time   07/11/11 12:48 PM      Component Value Range Comment   Specimen Description BLOOD LEFT ARM      Special Requests BOTTLES DRAWN AEROBIC AND ANAEROBIC 10CC      Setup Time [id number]      Culture        Value:        BLOOD CULTURE RECEIVED NO GROWTH TO DATE CULTURE WILL BE HELD FOR 5 DAYS BEFORE ISSUING A FINAL NEGATIVE REPORT   Report Status PENDING     B. BURGDORFI ANTIBODIES     Status: Normal   Collection Time   07/11/11 12:49 PM      Component Value Range Comment   B burgdorferi Ab IgG+IgM 0.13     CULTURE, BLOOD (ROUTINE X 2)     Status: Normal (Preliminary result)   Collection Time   07/11/11 12:57 PM      Component Value Range Comment   Specimen Description BLOOD LEFT FOREARM      Special Requests BOTTLES DRAWN AEROBIC ONLY 5CC      Setup Time [id number]  Culture        Value:        BLOOD CULTURE RECEIVED NO GROWTH TO DATE CULTURE WILL BE HELD FOR 5 DAYS BEFORE ISSUING A FINAL NEGATIVE REPORT   Report Status PENDING     GLUCOSE, CAPILLARY     Status: Normal   Collection Time   07/11/11  3:46 PM      Component Value Range Comment   Glucose-Capillary 99  70 - 99 (mg/dL)    Comment 1 Notify RN     GLUCOSE, CAPILLARY     Status: Normal   Collection Time   07/11/11  8:01 PM      Component Value Range  Comment   Glucose-Capillary 90  70 - 99 (mg/dL)    Comment 1 Notify RN     BASIC METABOLIC PANEL     Status: Abnormal   Collection Time   07/12/11  4:15 AM      Component Value Range Comment   Sodium 138  135 - 145 (mEq/L)    Potassium 3.7  3.5 - 5.1 (mEq/L)    Chloride 104  96 - 112 (mEq/L)    CO2 26  19 - 32 (mEq/L)    Glucose, Bld 114 (*) 70 - 99 (mg/dL)    BUN 16  6 - 23 (mg/dL)    Creatinine, Ser 1.61  0.50 - 1.35 (mg/dL)    Calcium 8.1 (*) 8.4 - 10.5 (mg/dL)    GFR calc non Af Amer 90 (*) >90 (mL/min)    GFR calc Af Amer >90  >90 (mL/min)   CBC     Status: Abnormal   Collection Time   07/12/11  4:15 AM      Component Value Range Comment   WBC 13.5 (*) 4.0 - 10.5 (K/uL)    RBC 4.10 (*) 4.22 - 5.81 (MIL/uL)    Hemoglobin 12.6 (*) 13.0 - 17.0 (g/dL)    HCT 09.6 (*) 04.5 - 52.0 (%)    MCV 91.2  78.0 - 100.0 (fL)    MCH 30.7  26.0 - 34.0 (pg)    MCHC 33.7  30.0 - 36.0 (g/dL)    RDW 40.9  81.1 - 91.4 (%)    Platelets 143 (*) 150 - 400 (K/uL)   DIFFERENTIAL     Status: Abnormal   Collection Time   07/12/11  4:15 AM      Component Value Range Comment   Neutrophils Relative 82 (*) 43 - 77 (%)    Neutro Abs 11.1 (*) 1.7 - 7.7 (K/uL)    Lymphocytes Relative 8 (*) 12 - 46 (%)    Lymphs Abs 1.1  0.7 - 4.0 (K/uL)    Monocytes Relative 9  3 - 12 (%)    Monocytes Absolute 1.2 (*) 0.1 - 1.0 (K/uL)    Eosinophils Relative 0  0 - 5 (%)    Eosinophils Absolute 0.0  0.0 - 0.7 (K/uL)    Basophils Relative 0  0 - 1 (%)    Basophils Absolute 0.0  0.0 - 0.1 (K/uL)   PHOSPHORUS     Status: Normal   Collection Time   07/12/11  4:15 AM      Component Value Range Comment   Phosphorus 2.8  2.3 - 4.6 (mg/dL)   MAGNESIUM     Status: Normal   Collection Time   07/12/11  4:15 AM      Component Value Range Comment   Magnesium 1.9  1.5 - 2.5 (mg/dL)  HEPATIC FUNCTION PANEL     Status: Abnormal   Collection Time   07/12/11  4:15 AM      Component Value Range Comment   Total Protein 5.8 (*) 6.0  - 8.3 (g/dL)    Albumin 2.5 (*) 3.5 - 5.2 (g/dL)    AST 36  0 - 37 (U/L)    ALT 63 (*) 0 - 53 (U/L)    Alkaline Phosphatase 77  39 - 117 (U/L)    Total Bilirubin 0.6  0.3 - 1.2 (mg/dL)    Bilirubin, Direct 0.1  0.0 - 0.3 (mg/dL)    Indirect Bilirubin 0.5  0.3 - 0.9 (mg/dL)     Dg Chest Port 1 View  07/12/2011  *RADIOLOGY REPORT*  Clinical Data: Shortness of breath, evaluate endotracheal tube position  PORTABLE CHEST - 1 VIEW  Comparison: Portable chest x-ray of 07/11/2011  Findings: Endotracheal tube has been removed.  There is little change in basilar opacity consistent with atelectasis. Cardiomegaly is stable.  Left IJ central venous line has retracted slightly with the tip in the left innominate vein.  IMPRESSION: Endotracheal tube removed.  Little change in probable pulmonary vascular congestion and basilar atelectasis.  Original Report Authenticated By: Juline Patch, M.D.   Dg Chest Port 1 View  07/11/2011  *RADIOLOGY REPORT*  Clinical Data: Intubated patient.  Evaluate endotracheal tube position.  PORTABLE CHEST - 1 VIEW  Comparison: 07/10/2011.  Findings: Endotracheal tube 42 mm from the carina.  Esophageal thermistor and nasogastric tube remain present.  Left IJ central line is present with the tip at the confluence of the brachiocephalic veins.  Diffuse bilateral roughly symmetric airspace disease is present, likely representing pulmonary edema. Aspiration or multifocal pneumonia considered less likely. Epicardial pacing leads are present.  Inferior approach transvenous pacemaker lead appears unchanged.  IMPRESSION:  1.  Stable support apparatus.  Endotracheal tube 42 mm from the carina. 2.  Unchanged position of the inferior approach transvenous pacemaker lead. 3.  Bilateral perihilar and basilar predominant airspace disease likely pulmonary edema.  Original Report Authenticated By: Andreas Newport, M.D.     Assessment/Plan:   56 YO male who was brought to the hospital due to STEMI and  now question of endocarditis.  While hospitalized neurology was consulted to further evaluate possibility of neuromuscular disorder due to information that his brother might have been diagnosed with a form of neuromuscular disorder.  Patient is a poor historian and cannot recall his brother ever being diagnosed with any form of neuromuscular disorder.  I spoke to the wife who states she has no knowledge of any form of neuromuscular disorder in his side of the family and did not know of any form of disorder in the brothers history.  She will be looking into this.   On exam patient is weak in the proximal lower extremities - hip flexors, he also shows fasciculations in both upper legs and pectoralis muscles - the etiology at this time is unclear as he got paralytics, but he does report facial twitching before his cardiac arrest. CPK with high MB faction was probably elevated due to cardiac causes.   My recommendation is to treat patient for suspected endocarditis and stabilize him. Subsequently, after he gets PT, an outpatient neurology referral for an EMG/NCS is recommended. Call with questions.   Felicie Morn PA-C [facility] [phone]  07/12/2011, 10:46 AM

## 2011-07-12 NOTE — Progress Notes (Signed)
*  PRELIMINARY RESULTS* Echocardiogram 2D Echocardiogram has been performed.  Casey Richard [facility] 07/12/2011, 11:21 AM

## 2011-07-12 NOTE — Progress Notes (Signed)
eLink Physician-Brief Progress Note Patient Name: Casey Richard DOB: [birth year] MRN: [id number]  Date of Service  07/12/2011   HPI/Events of Note   Ct chest and sinsus report reviewed.   Main thing is sinusitis. On camera he looks stable  eICU Interventions  Probably needs ENT consult given bacteremia but can wait till 07/12/10 am rounds   Intervention Category Intermediate Interventions: Diagnostic test evaluation  Marcelis Wissner 07/12/2011, 8:13 PM
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
## 2011-07-12 NOTE — Progress Notes (Addendum)
[facility]  Subjective: Patient alert, extubated orient to self and knows he is in the hospital.  Objective: Vital signs in last 24 hours: Temp:  [97.2 F (36.2 C)-98.9 F (37.2 C)] 98.2 F (36.8 C) (01/22 0400) Pulse Rate:  [59-60] 59  (01/22 0500) SpO2:  [97 %-100 %] 100 % (01/22 0500) Arterial Line BP: (122-159)/(54-73) 146/69 mmHg (01/22 0400) FiO2 (%):  [29.8 %-30.1 %] 30 % (01/21 1000) Weight:  [77.9 kg (171 lb 11.8 oz)-79.1 kg (174 lb 6.1 oz)] 77.9 kg (171 lb 11.8 oz) (01/22 0452) Last BM Date:  (PTA)  Intake/Output from previous day: 01/21 0701 - 01/22 0700 In: 1520 [P.O.:540; I.V.:860; IV Piggyback:120] Out: 3350 [Urine:3350] Intake/Output this shift: Total I/O In: 600 [P.O.:60; I.V.:520; IV Piggyback:20] Out: 2200 [Urine:2200]  Medications Current Facility-Administered Medications  Medication Dose Route Frequency Provider Last Rate Last Dose  . 0.9 %  sodium chloride infusion   Intravenous Continuous Corky Crafts, MD 20 mL/hr at 07/11/11 [phone]    . 0.9 %  sodium chloride infusion   Intravenous Continuous PRN Chrystie Nose, MD 20 mL/hr at 07/10/11 0900    . acetaminophen (TYLENOL) tablet 650 mg  650 mg Oral Q4H PRN Corky Crafts, MD   650 mg at 07/12/11 0027  . cefTRIAXone (ROCEPHIN) 1 g in dextrose 5 % 50 mL IVPB  1 g Intravenous Once Dannielle Huh, PHARMD   1 g at 07/11/11 1327  . cefTRIAXone (ROCEPHIN) 2 g in dextrose 5 % 50 mL IVPB  2 g Intravenous Q24H Dannielle Huh, PHARMD      . chlorhexidine (PERIDEX) 0.12 % solution 15 mL  15 mL Mouth/Throat BID Corky Crafts, MD   15 mL at 07/11/11 2212  . furosemide (LASIX) injection 20 mg  20 mg Intravenous Q12H Mcarthur Rossetti. Tyson Alias, MD   20 mg at 07/11/11 2212  . labetalol (NORMODYNE,TRANDATE) injection 10 mg  10 mg Intravenous Q10 min PRN Nolon Lennert, MD      . ondansetron [facility]) injection 4 mg  4 mg Intravenous Q6H PRN Corky Crafts, MD       . pantoprazole (PROTONIX) injection 40 mg  40 mg Intravenous QHS Vilinda Blanks Minor, NP   40 mg at 07/11/11 2205  . sodium chloride 0.9 % injection 10 mL  10 mL Intravenous BID Coralyn Helling, MD   10 mL at 07/11/11 2211  . sodium chloride 0.9 % injection 3 mL  3 mL Intravenous Q12H Nolon Lennert, MD   3 mL at 07/11/11 2208  . DISCONTD: amiodarone (NEXTERONE) IV bolus only 150 mg/100 mL  150 mg Intravenous Once Chrystie Nose, MD      . DISCONTD: antiseptic oral rinse (BIOTENE) solution 15 mL  15 mL Mouth Rinse QID Coralyn Helling, MD   15 mL at 07/11/11 0400  . DISCONTD: artificial tears (LACRILUBE) ophthalmic ointment 1 application  1 application Both Eyes Q8H William S Minor, NP   1 application at 07/11/11 0557  . DISCONTD: cefTRIAXone (ROCEPHIN) 1 g in dextrose 5 % 50 mL IVPB  1 g Intravenous Q24H Coralyn Helling, MD   1 g at 07/11/11 1052  . DISCONTD: cisatracurium (NIMBEX) 200 mg in sodium chloride 0.9 % 200 mL infusion  1-1.5 mcg/kg/min Intravenous Continuous Vilinda Blanks Minor, NP 4.9 mL/hr at 07/09/11 1732 1 mcg/kg/min at 07/09/11 1732  . DISCONTD: cisatracurium (NIMBEX) 200 mg in sodium chloride 0.9 % 200 mL infusion  0.5-10  mcg/kg/min Intravenous Titrated Toy Baker, MD 4.9 mL/hr at 07/11/11 0033 1 mcg/kg/min at 07/11/11 0033  . DISCONTD: fentaNYL (SUBLIMAZE) 10 mcg/mL in sodium chloride 0.9 % 250 mL infusion  50-400 mcg/hr Intravenous Titrated Nolon Lennert, MD   100 mcg/hr at 07/10/11 1121  . DISCONTD: fentaNYL (SUBLIMAZE) bolus via infusion 50-100 mcg  50-100 mcg Intravenous Q6H PRN Nolon Lennert, MD      . DISCONTD: midazolam (VERSED) 1 mg/mL in sodium chloride 0.9 % 50 mL infusion  2-10 mg/hr Intravenous Titrated Nolon Lennert, MD   4 mg/hr at 07/11/11 0108  . DISCONTD: midazolam (VERSED) bolus via infusion 1-2 mg  1-2 mg Intravenous Q2H PRN Nolon Lennert, MD      . DISCONTD: norepinephrine (LEVOPHED) 4,000 mcg in dextrose 5 % 250 mL infusion  0.5-10 mcg/min Intravenous Titrated Vilinda Blanks Minor,  NP   1 mcg/min at 07/11/11 0545  . DISCONTD: vancomycin (VANCOCIN) IVPB 1000 mg/200 mL premix  1,000 mg Intravenous Q8H Garth Bigness Frens, PHARMD   1,000 mg at 07/11/11 0500    PE: General appearance: alert, cooperative and no distress Lungs: Bilateral rales. Heart: regular rate and rhythm, S1, S2 normal, no murmur, click, rub or gallop Abdomin: +BS all quads Extremities: No LEE Pulses: 2+ and symmetric radials 2+ right DP, 1+ left DP,  Lab Results:   Basename 07/12/11 0415 07/11/11 0910 07/11/11 0400 07/10/11 0422  WBC 13.5* -- 16.8* 16.0*  HGB 12.6* 13.6 14.1 --  HCT 37.4* 40.0 41.8 --  PLT 143* -- 136* 131*   BMET  Basename 07/12/11 0415 07/11/11 0910 07/11/11 0400 07/10/11 2345  NA 138 141 139 --  K 3.7 4.0 3.9 --  CL 104 110 110 --  CO2 26 -- 21 20  GLUCOSE 114* 112* 97 --  BUN 16 15 14  --  CREATININE 0.98 0.90 0.70 --  CALCIUM 8.1* -- 7.9* 8.0*   PT/INR  Basename 07/10/11 0423 07/09/11 1849 07/09/11 1300  LABPROT 15.5* 15.3* 15.0  INR 1.20 1.18 1.16   Cardiac Enzymes Troponin last 0.60  Studies/Results: PORTABLE CHEST - 1 VIEW  Comparison: 07/10/2011.  Findings: Endotracheal tube 42 mm from the carina. Esophageal  thermistor and nasogastric tube remain present. Left IJ central  line is present with the tip at the confluence of the  brachiocephalic veins. Diffuse bilateral roughly symmetric  airspace disease is present, likely representing pulmonary edema.  Aspiration or multifocal pneumonia considered less likely.  Epicardial pacing leads are present. Inferior approach transvenous  pacemaker lead appears unchanged.  IMPRESSION:  1. Stable support apparatus. Endotracheal tube 42 mm from the  carina.  2. Unchanged position of the inferior approach transvenous  pacemaker lead.  3. Bilateral perihilar and basilar predominant airspace disease  likely pulmonary edema.   Assessment/Plan  Principal Problem:  *Cardiac arrest - ventricular fibrillation,  07/09/11 Active Problems:  Respiratory arrest, vent dependent-Extubated yesterday.  Complete heart block, requiring external pacing then temp wire pacing.  NSTEMI (non-ST elevated myocardial infarction) Gram+ Cocci in chains in blood cult.  Plan: Patient extubated yesterday ~1000hrs. WBCs and Troponin  trending down. CXR pending.  Small NSTEMI without obstructive CAD. LVEF is >65%.  Temp pacing.  Plan for PPM tomorrow, however, blood cultures +.    LOS: 3 days    HAGER,BRYAN W 07/12/2011 6:38 AM  I have seen & examined the patient & agree with Mr. Jasper Riling note. TPM thresholds - 1.5-2 mA - set @ 4, rate 60 bpm Underlying rhythm -  NSR with 3rd Deg HB, junctional escape rate ~40-45.   I have discussed the plan of action with Dr. Royann Shivers re ? TPM wit + blood Cx ? Endocarditis.  Reluctant to place TPM in setting of + Blood Cx - will need to see negative Cx -- will work to arrange TEE tomorrow to confirm no evidence of endocarditis.  Will tentatively plan TPM for Friday pending results of Cx & TEE.  Will consider changing PPM site from Femoral Vein to RIJ tomorrow. -- Make NPO after MN for TEE/RIJ  .  Marykay Lex, M.D., M.S. [facility] [address] [place], [place]  [address]  [phone]  07/12/2011 6:03 PM
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
## 2011-07-12 NOTE — Progress Notes (Signed)
ANTIBIOTIC CONSULT NOTE - FOLLOW UP  Pharmacy Consult for Gentamicin Indication: R/o endocardiitis  No Known Allergies  Patient Measurements: Height: 5\' 11"  (180.3 cm) Weight: 171 lb 11.8 oz (77.9 kg) IBW/kg (Calculated) : 75.3  Adjusted Body Weight: 75  Vital Signs: Temp: 98.1 F (36.7 C) (01/22 0729) Temp src: Oral (01/22 0729) Pulse Rate: 59  (01/22 0700) Intake/Output from previous day: 01/21 0701 - 01/22 0700 In: 1600 [P.O.:540; I.V.:940; IV Piggyback:120] Out: 3475 [Urine:3475] Intake/Output from this shift:    Labs:  Basename 07/12/11 0415 07/11/11 0910 07/11/11 0400 07/10/11 0422  WBC 13.5* -- 16.8* 16.0*  HGB 12.6* 13.6 14.1 --  PLT 143* -- 136* 131*  LABCREA -- -- -- --  CREATININE 0.98 0.90 0.70 --   Estimated Creatinine Clearance: 88.6 ml/min (by C-G formula based on Cr of 0.98). No results found for this basename: VANCOTROUGH:2,VANCOPEAK:2,VANCORANDOM:2,GENTTROUGH:2,GENTPEAK:2,GENTRANDOM:2,TOBRATROUGH:2,TOBRAPEAK:2,TOBRARND:2,AMIKACINPEAK:2,AMIKACINTROU:2,AMIKACIN:2, in the last 72 hours   Microbiology: Recent Results (from the past 720 hour(s))  MRSA PCR SCREENING     Status: Normal   Collection Time   07/09/11  2:51 PM      Component Value Range Status Comment   MRSA by PCR NEGATIVE  NEGATIVE  Final   CULTURE, BLOOD (ROUTINE X 2)     Status: Normal   Collection Time   07/09/11  3:08 PM      Component Value Range Status Comment   Specimen Description BLOOD LEFT HAND   Final    Special Requests BOTTLES DRAWN AEROBIC ONLY 5CC   Final    Setup Time [id number]   Final    Culture     Final    Value: VIRIDANS STREPTOCOCCUS     Note: Gram Stain Report Called to,Read Back By and Verified With: RN D. SULLIVAN ON 07/10/11 AT 1557 BY TEDAR   Report Status 07/12/2011 FINAL   Final    Organism ID, Bacteria VIRIDANS STREPTOCOCCUS   Final   CULTURE, BLOOD (ROUTINE X 2)     Status: Normal   Collection Time   07/09/11  3:47 PM      Component Value Range Status  Comment   Specimen Description BLOOD LEFT ARM   Final    Special Requests BOTTLES DRAWN AEROBIC ONLY 10CC   Final    Setup Time [id number]   Final    Culture     Final    Value: VIRIDANS STREPTOCOCCUS     Note: SUSCEPTIBILITIES PERFORMED ON PREVIOUS CULTURE WITHIN THE LAST 5 DAYS.     Note: Gram Stain Report Called to,Read Back By and Verified With: RN D. SULLIVAN ON 07/10/11 AT 1000 BY TEDAR   Report Status 07/12/2011 FINAL   Final   URINE CULTURE     Status: Normal   Collection Time   07/09/11 10:28 PM      Component Value Range Status Comment   Specimen Description URINE, RANDOM   Final    Special Requests NONE   Final    Setup Time [id number]   Final    Colony Count NO GROWTH   Final    Culture NO GROWTH   Final    Report Status 07/10/2011 FINAL   Final   CULTURE, RESPIRATORY     Status: Normal   Collection Time   07/10/11 12:48 PM      Component Value Range Status Comment   Specimen Description TRACHEAL ASPIRATE   Final    Special Requests NONE   Final    Gram Stain  Final    Value: ABUNDANT WBC PRESENT,BOTH PMN AND MONONUCLEAR     NO SQUAMOUS EPITHELIAL CELLS SEEN     ABUNDANT GRAM POSITIVE RODS     FEW GRAM NEGATIVE COCCOBACILLI   Culture Non-Pathogenic Oropharyngeal-type Flora Isolated.   Final    Report Status 07/12/2011 FINAL   Final   CULTURE, BLOOD (ROUTINE X 2)     Status: Normal (Preliminary result)   Collection Time   07/11/11 12:48 PM      Component Value Range Status Comment   Specimen Description BLOOD LEFT ARM   Final    Special Requests BOTTLES DRAWN AEROBIC AND ANAEROBIC 10CC   Final    Setup Time [id number]   Final    Culture     Final    Value:        BLOOD CULTURE RECEIVED NO GROWTH TO DATE CULTURE WILL BE HELD FOR 5 DAYS BEFORE ISSUING A FINAL NEGATIVE REPORT   Report Status PENDING   Incomplete   CULTURE, BLOOD (ROUTINE X 2)     Status: Normal (Preliminary result)   Collection Time   07/11/11 12:57 PM      Component Value Range Status Comment    Specimen Description BLOOD LEFT FOREARM   Final    Special Requests BOTTLES DRAWN AEROBIC ONLY 5CC   Final    Setup Time [id number]   Final    Culture     Final    Value:        BLOOD CULTURE RECEIVED NO GROWTH TO DATE CULTURE WILL BE HELD FOR 5 DAYS BEFORE ISSUING A FINAL NEGATIVE REPORT   Report Status PENDING   Incomplete     Anti-infectives     Start     Dose/Rate Route Frequency Ordered Stop   07/12/11 1300   cefTRIAXone (ROCEPHIN) 2 g in dextrose 5 % 50 mL IVPB        2 g 100 mL/hr over 30 Minutes Intravenous Every 24 hours 07/11/11 1138     07/12/11 0915   gentamicin (GARAMYCIN) 389.6 mg in dextrose 5 % 50 mL IVPB  Status:  Discontinued        5 mg/kg  77.9 kg 119.5 mL/hr over 30 Minutes Intravenous  Once 07/12/11 0915 07/12/11 0928   07/11/11 1300   cefTRIAXone (ROCEPHIN) 1 g in dextrose 5 % 50 mL IVPB        1 g 100 mL/hr over 30 Minutes Intravenous  Once 07/11/11 1138 07/11/11 1357   07/10/11 1300   vancomycin (VANCOCIN) IVPB 1000 mg/200 mL premix  Status:  Discontinued        1,000 mg 200 mL/hr over 60 Minutes Intravenous Every 8 hours 07/10/11 1158 07/11/11 0952   07/10/11 1100   cefTRIAXone (ROCEPHIN) 1 g in dextrose 5 % 50 mL IVPB  Status:  Discontinued        1 g 100 mL/hr over 30 Minutes Intravenous Every 24 hours 07/10/11 1018 07/11/11 1336          Assessment: 55 yom s/p vfib arrest/artic sun protocol and pacer placed. 2/2 positive blood cultures for strep viridans, repeat cxs pending. Patient started on vancomycin and now transitioned to ceftriaxone (2g q24), with concern for endocarditis, gentamicin was added to regimen today. Will dose for synergy 3mg /kg/day divided q 8 hours.   Goal of Therapy:  Goal peak 3-4 Goal trough <1  Plan:  Gentamicin 80mg  IV q 8 hours Check peak/trough as indicated  Severiano Gilbert 07/12/2011,9:31 AM

## 2011-07-13 ENCOUNTER — Encounter (HOSPITAL_COMMUNITY)
Admission: EM | Disposition: A | Discharge status: Home Health | Payer: Self-pay | Source: Home / Self Care | Attending: Internal Medicine

## 2011-07-13 ENCOUNTER — Inpatient Hospital Stay (HOSPITAL_COMMUNITY): Payer: Medicaid Other

## 2011-07-13 LAB — BASIC METABOLIC PANEL
BUN: 15 mg/dL (ref 6–23)
CO2: 28 mEq/L (ref 19–32)
Calcium: 8.1 mg/dL — ABNORMAL LOW (ref 8.4–10.5)
Chloride: 106 mEq/L (ref 96–112)
Creatinine, Ser: 0.77 mg/dL (ref 0.50–1.35)
GFR calc Af Amer: >90 mL/min (ref >90)
GFR calc non Af Amer: >90 mL/min (ref >90)
Glucose, Bld: 124 mg/dL — ABNORMAL HIGH (ref 70–99)
Potassium: 4.1 mEq/L (ref 3.5–5.1)
Sodium: 140 mEq/L (ref 135–145)

## 2011-07-13 LAB — GLUCOSE, CAPILLARY
Glucose-Capillary: 107 mg/dL — ABNORMAL HIGH (ref 70–99)
Glucose-Capillary: 100 mg/dL — ABNORMAL HIGH (ref 70–99)
Glucose-Capillary: 158 mg/dL — ABNORMAL HIGH (ref 70–99)
Glucose-Capillary: 104 mg/dL — ABNORMAL HIGH (ref 70–99)

## 2011-07-13 LAB — CBC
HCT: 37.6 % — ABNORMAL LOW (ref 39.0–52.0)
Hemoglobin: 12.5 g/dL — ABNORMAL LOW (ref 13.0–17.0)
MCH: 30.5 pg (ref 26.0–34.0)
MCHC: 33.2 g/dL (ref 30.0–36.0)
MCV: 91.7 fL (ref 78.0–100.0)
Platelets: 127 10*3/uL — ABNORMAL LOW (ref 150–400)
RBC: 4.1 MIL/uL — ABNORMAL LOW (ref 4.22–5.81)
RDW: 14.2 % (ref 11.5–15.5)
WBC: 10.9 10*3/uL — ABNORMAL HIGH (ref 4.0–10.5)

## 2011-07-13 LAB — DIFFERENTIAL
Basophils Absolute: 0 10*3/uL (ref 0.0–0.1)
Basophils Relative: 0 % (ref 0–1)
Eosinophils Absolute: 0.1 10*3/uL (ref 0.0–0.7)
Eosinophils Relative: 1 % (ref 0–5)
Lymphocytes Relative: 12 % (ref 12–46)
Lymphs Abs: 1.3 10*3/uL (ref 0.7–4.0)
Monocytes Absolute: 1 10*3/uL (ref 0.1–1.0)
Monocytes Relative: 9 % (ref 3–12)
Neutro Abs: 8.5 10*3/uL — ABNORMAL HIGH (ref 1.7–7.7)
Neutrophils Relative %: 78 % — ABNORMAL HIGH (ref 43–77)

## 2011-07-13 LAB — CK TOTAL AND CKMB (NOT AT ARMC)
CK, MB: 3.3 ng/mL (ref 0.3–4.0)
Relative Index: 0.7 (ref 0.0–2.5)
Total CK: 485 U/L — ABNORMAL HIGH (ref 7–232)

## 2011-07-13 LAB — C-REACTIVE PROTEIN: CRP: 12.02 mg/dL — ABNORMAL HIGH (ref <0.60)

## 2011-07-13 LAB — PHOSPHORUS: Phosphorus: 2.9 mg/dL (ref 2.3–4.6)

## 2011-07-13 LAB — MAGNESIUM: Magnesium: 2 mg/dL (ref 1.5–2.5)

## 2011-07-13 SURGERY — ECHOCARDIOGRAM, TRANSESOPHAGEAL
Anesthesia: Moderate Sedation

## 2011-07-13 SURGERY — TEMPORARY PACEMAKER INSERTION
Anesthesia: LOCAL

## 2011-07-13 SURGERY — PERMANENT PACEMAKER INSERTION
Anesthesia: LOCAL

## 2011-07-13 MED ORDER — SODIUM CHLORIDE 0.45 % IV SOLN
INTRAVENOUS | Status: DC
  Administered 2011-07-13: 20 mL/h via INTRAVENOUS

## 2011-07-13 MED ORDER — SODIUM CHLORIDE 0.9 % IJ SOLN: 3.0000 mL | INTRAMUSCULAR | Status: DC | PRN

## 2011-07-13 MED ORDER — FENTANYL CITRATE 0.05 MG/ML IJ SOLN
250.0000 ug | Freq: Once | INTRAMUSCULAR | Status: AC
  Administered 2011-07-13: 50 ug via INTRAVENOUS
  Filled 2011-07-13 (×2): qty 2

## 2011-07-13 MED ORDER — SODIUM CHLORIDE 0.9 % IV SOLN: 250.0000 mL | INTRAVENOUS | Status: DC | PRN

## 2011-07-13 MED ORDER — MIDAZOLAM HCL 10 MG/2ML IJ SOLN: 10.0000 mg | Freq: Once | INTRAMUSCULAR | Status: DC

## 2011-07-13 MED ORDER — BENZOCAINE 20 % MT SOLN
1.0000 "application " | OROMUCOSAL | Status: DC | PRN
  Filled 2011-07-13: qty 57

## 2011-07-13 MED ORDER — MIDAZOLAM HCL 2 MG/2ML IJ SOLN
10.0000 mg | Freq: Once | INTRAMUSCULAR | Status: AC
  Administered 2011-07-13: 4 mg via INTRAVENOUS
  Filled 2011-07-13 (×2): qty 10

## 2011-07-13 MED ORDER — SODIUM CHLORIDE 0.9 % IJ SOLN
3.0000 mL | Freq: Two times a day (BID) | INTRAMUSCULAR | Status: DC
  Administered 2011-07-13: 3 mL via INTRAVENOUS

## 2011-07-13 MED FILL — Epinephrine HCl Inj 0.1 MG/ML: INTRAMUSCULAR | Qty: 30 | Status: AC

## 2011-07-13 MED FILL — Atropine Sulfate Inj 0.1 MG/ML: INTRAMUSCULAR | Qty: 20 | Status: AC
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
## 2011-07-13 NOTE — Progress Notes (Addendum)
INFECTIOUS DISEASE PROGRESS NOTE    Date of Admission:  07/09/2011     Antibiotics:  Vanc 1/20 --> 1/21  Gentamicin 1/22 -->  Ceftriaxone 1 g dose 1/20 --> 1/21 2 g dose 1/22   Problem List:  1. V. Fib. Cardiac arrest  2. Respiratory arrest  3. Complete heart block now pacer dependent  4. Strep viridans positive blood culture  5. NSTEMI    . cefTRIAXone (ROCEPHIN)  IV  2 g Intravenous Q24H  . chlorhexidine  15 mL Mouth/Throat BID  . fentaNYL  250 mcg Intravenous Once  . furosemide  20 mg Intravenous Daily  . gentamicin  80 mg Intravenous Q8H  . midazolam  10 mg Intravenous Once  . sodium chloride  10 mL Intravenous BID  . sodium chloride  3 mL Intravenous Q12H  . sodium chloride  3 mL Intravenous Q12H  . DISCONTD: midazolam  10 mg Intravenous Once   Subjective: Feeling better this am.  Still weak but eating better.  Denies fevers, chills, nausea, vomiting, or palpitations.  Wife present at beside.  She states that in the end of November/early December he came down with flu like symptoms including fevers, chills, weakness, decreased appetite, cough, body aches, and diarrhea.  2 of their 4 children were sick with the same symptoms for about 1 week.  The fevers, chills, body aches, and diarrhea improved but the weakness and fatigue did not.  He also had developed some shortness of breath over that time.  The week leading up to the arrest he had continued to have shortness of breath with activity, fatigue, and continued weakness.  The wife also notes that the patient's brother has a pacemaker that was placed 2 years ago at the age of 47 and the patient's mother also has a pacemaker placed about 5 years ago.    Objective: Temp:  [98 F (36.7 C)-98.4 F (36.9 C)] 98.1 F (36.7 C) (01/23 0829) Pulse Rate:  [59-62] 62  (01/23 0900) Resp:  [22-24] 24  (01/23 0900) BP: (150)/(76) 150/76 mmHg (01/23 0829) SpO2:  [98 %-100 %] 100 % (01/23 0900) Weight:  [160 lb 11.5 oz (72.9 kg)] 160 lb  11.5 oz (72.9 kg) (01/23 0500)  Vitals reviewed. General: resting in bed, NAD HEENT: PERRL, EOMI, no scleral icterus Cardiac: RRR, no rubs, murmurs or gallops Pulm: clear to auscultation bilaterally, no wheezes, rales, or rhonchi Abd: soft, nontender, nondistended, BS present Ext: warm and well perfused, no pedal edema Neuro: alert and oriented X3, short term memory still mildly impaired but improved from yesterday.  cranial nerves II-XII grossly intact, strength and sensation to light touch equal in bilateral upper and lower extremities  Lab Results Lab Results  Component Value Date   WBC 10.9* 07/13/2011   HGB 12.5* 07/13/2011   HCT 37.6* 07/13/2011   MCV 91.7 07/13/2011   PLT 127* 07/13/2011    Lab Results  Component Value Date   CREATININE 0.77 07/13/2011   BUN 15 07/13/2011   NA 140 07/13/2011   K 4.1 07/13/2011   CL 106 07/13/2011   CO2 28 07/13/2011    Lab Results  Component Value Date   ALT 63* 07/12/2011   AST 36 07/12/2011   ALKPHOS 77 07/12/2011   BILITOT 0.6 07/12/2011     Microbiology: Recent Results (from the past 240 hour(s))  MRSA PCR SCREENING     Status: Normal   Collection Time   07/09/11  2:51 PM      Component  Value Range Status Comment   MRSA by PCR NEGATIVE  NEGATIVE  Final   CULTURE, BLOOD (ROUTINE X 2)     Status: Normal   Collection Time   07/09/11  3:08 PM      Component Value Range Status Comment   Specimen Description BLOOD LEFT HAND   Final    Special Requests BOTTLES DRAWN AEROBIC ONLY 5CC   Final    Setup Time [id number]   Final    Culture     Final    Value: VIRIDANS STREPTOCOCCUS     Note: Gram Stain Report Called to,Read Back By and Verified With: RN D. SULLIVAN ON 07/10/11 AT 1557 BY TEDAR   Report Status 07/12/2011 FINAL   Final    Organism ID, Bacteria VIRIDANS STREPTOCOCCUS   Final   CULTURE, BLOOD (ROUTINE X 2)     Status: Normal   Collection Time   07/09/11  3:47 PM      Component Value Range Status Comment   Specimen Description  BLOOD LEFT ARM   Final    Special Requests BOTTLES DRAWN AEROBIC ONLY 10CC   Final    Setup Time [id number]   Final    Culture     Final    Value: VIRIDANS STREPTOCOCCUS     Note: SUSCEPTIBILITIES PERFORMED ON PREVIOUS CULTURE WITHIN THE LAST 5 DAYS.     Note: Gram Stain Report Called to,Read Back By and Verified With: RN D. SULLIVAN ON 07/10/11 AT 1000 BY TEDAR   Report Status 07/12/2011 FINAL   Final   URINE CULTURE     Status: Normal   Collection Time   07/09/11 10:28 PM      Component Value Range Status Comment   Specimen Description URINE, RANDOM   Final    Special Requests NONE   Final    Setup Time [id number]   Final    Colony Count NO GROWTH   Final    Culture NO GROWTH   Final    Report Status 07/10/2011 FINAL   Final   CULTURE, RESPIRATORY     Status: Normal   Collection Time   07/10/11 12:48 PM      Component Value Range Status Comment   Specimen Description TRACHEAL ASPIRATE   Final    Special Requests NONE   Final    Gram Stain     Final    Value: ABUNDANT WBC PRESENT,BOTH PMN AND MONONUCLEAR     NO SQUAMOUS EPITHELIAL CELLS SEEN     ABUNDANT GRAM POSITIVE RODS     FEW GRAM NEGATIVE COCCOBACILLI   Culture Non-Pathogenic Oropharyngeal-type Flora Isolated.   Final    Report Status 07/12/2011 FINAL   Final   CULTURE, BLOOD (ROUTINE X 2)     Status: Normal (Preliminary result)   Collection Time   07/11/11 12:48 PM      Component Value Range Status Comment   Specimen Description BLOOD LEFT ARM   Final    Special Requests BOTTLES DRAWN AEROBIC AND ANAEROBIC 10CC   Final    Setup Time [id number]   Final    Culture     Final    Value:        BLOOD CULTURE RECEIVED NO GROWTH TO DATE CULTURE WILL BE HELD FOR 5 DAYS BEFORE ISSUING A FINAL NEGATIVE REPORT   Report Status PENDING   Incomplete   CULTURE, BLOOD (ROUTINE X 2)     Status: Normal (Preliminary result)   Collection Time  07/11/11 12:57 PM      Component Value Range Status Comment   Specimen Description BLOOD  LEFT FOREARM   Final    Special Requests BOTTLES DRAWN AEROBIC ONLY 5CC   Final    Setup Time [id number]   Final    Culture     Final    Value:        BLOOD CULTURE RECEIVED NO GROWTH TO DATE CULTURE WILL BE HELD FOR 5 DAYS BEFORE ISSUING A FINAL NEGATIVE REPORT   Report Status PENDING   Incomplete    Studies/Results: Ct Chest W Contrast  07/12/2011 Findings: No evidence of abnormal mediastinal adenopathy.  Pacemaker lead extending from the IVC is present with its tip at the apex of the right ventricle.  Left internal jugular vein central venous catheter tip is in the left innominate vein.  Small bilateral pleural effusions right greater than left. Bibasilar dependent consolidation primarily in the lower lobes bilaterally.  No peripheral or peribronchial nodularity to suggest interstitial sarcoid lung disease.  IMPRESSION: Bilateral pleural effusions and bibasilar consolidation. The pattern is suspicious for aspiration.  No evidence of mediastinal adenopathy.    Dg Chest Port 1 View  07/12/2011 Findings: Endotracheal tube has been removed.  There is little change in basilar opacity consistent with atelectasis. Cardiomegaly is stable.  Left IJ central venous line has retracted slightly with the tip in the left innominate vein.  IMPRESSION: Endotracheal tube removed.  Little change in probable pulmonary vascular congestion and basilar atelectasis.   Ct Maxillofacial Wo Cm  07/12/2011 Findings: Air fluid levels are present in the bilateral maxillary sinuses, and bilateral sphenoid sinuses.  Mucosal thickening throughout the paranasal sinuses is present.  There is no bony destruction.  Mastoid air cells are clear.  Ostiomeatal complexes are clear.  No obvious evidence of mass effect within the soft tissue spaces of the head or neck.  Airway is patent. Epiglottis is unremarkable.  IMPRESSION: Inflammatory changes throughout the paranasal sinuses as described. Air-fluid levels are present in the bilateral  maxillary and sphenoid sinuses    Assessment: 1. Strep viridans bacteremia:  Blood culture on admission was positive 2/2 bottles for Strep viridans.  While this could represent a transient bacteremia the concern for native valve endocarditis is a very real possibility.  On speaking with the wife today he had been feeling ill since the end of November/beginning of December with what started as flu like symptoms that were also present in the children.  They improved but he did not with continued weakness, cough, and dyspnea on exertion.  CT of the facial bones was positive for sinusitis and negative for dental lesions.  CCM plans today for a TEE done by cardiology to better assess the heart valves as well as address the question of a abscess that could be causing his complete heart block.  He has remained afebrile and his WBC is continuing to trend downward.  Given the concern for native valve endocarditis with embolization as a possible cause of his primary arrhythmia and the fact that his viridans strep is penicillin sensitive we will continue to treat with Rocephin 2 g daily and Gentamicin per the [facility] recommendations.  If the TEE is negative for abscess or other cardiac complication of infection the AHA recommendations would be for a 2 week course of therapy from the date of the negative blood culture.  Plan: 1. Continue current doses of Rocephin and Gentamicin 2. Follow the blood culture results  3. Follow the results  of the TEE to decide duration of therapy.   Casey Richard,Casey Richard 07/13/2011 11:01 AM    Attending addendum: I agree with the assessment and plan. We will follow closely with you.  Cliffton Asters, MD [facility] [phone] pager   [phone] cell 07/13/2011, 3:40 PM

## 2011-07-13 NOTE — Progress Notes (Signed)
TEE performed at the bedside with Dr. Royann Shivers. Pt tolerated procedure well.

## 2011-07-13 NOTE — Progress Notes (Signed)
Subjective:  No acute concerns over night, pt resting well without concerns with CP, SOB or near syncopal sx.  Objective:  Vital Signs in the last 24 hours: Temp:  [98 F (36.7 C)-98.4 F (36.9 C)] 98.4 F (36.9 C) (01/23 0400) Pulse Rate:  [59-62] 62  (01/23 0400) SpO2:  [98 %-100 %] 100 % (01/23 0400)  Intake/Output from previous day: 01/22 0701 - 01/23 0700 In: 1740 [P.O.:1080; I.V.:560; IV Piggyback:100] Out: 3325 [Urine:3325] Intake/Output from this shift: Total I/O In: 350 [P.O.:120; I.V.:180; IV Piggyback:50] Out: 975 [Urine:975]  Physical Exam: General appearance: alert, cooperative, fatigued and no distress Neck: no adenopathy, no carotid bruit, no JVD, supple, symmetrical, trachea midline and thyroid not enlarged, symmetric, no tenderness/mass/nodules Lungs: clear to auscultation bilaterally Heart: regular rate and rhythm, S1, S2 normal, no murmur, click, rub or gallop Abdomen: soft, non-tender; bowel sounds normal; no masses,  no organomegaly; suprapubic catheter in place Extremities: extremities normal, atraumatic, no cyanosis or edema and no splinter hemmorhages, rashes, Osler's nodes or Janeway lesions; temp PM wire right groin Pulses: 2+ and symmetric Skin: Skin color, texture, turgor normal. No rashes or lesions Neurologic: Alert and oriented X 3, normal strength and tone. Normal symmetric reflexes. Normal coordination and gait   Lab Results:  Basename 07/13/11 0351 07/12/11 0415  WBC 10.9* 13.5*  HGB 12.5* 12.6*  PLT 127* 143*    Basename 07/13/11 0351 07/12/11 0415  NA 140 138  K 4.1 3.7  CL 106 104  CO2 28 26  GLUCOSE 124* 114*  BUN 15 16  CREATININE 0.77 0.98    Basename 07/10/11 0800  TROPONINI 0.60*   Hepatic Function Panel  Basename 07/12/11 0415  PROT 5.8*  ALBUMIN 2.5*  AST 36  ALT 63*  ALKPHOS 77  BILITOT 0.6  BILIDIR 0.1  IBILI 0.5   No results found for this basename: CHOL in the last 72 hours No results found for this  basename: PROTIME in the last 72 hours  Imaging: Imaging results have been reviewed  Cardiac Studies:  Assessment/Plan:  1) Cardiac Arrest 2) VDRF secondary to #1 resolved 3)CHB, with temp wire, possible PPM Friday 4) Sepsis/bacteremia, f/u repeat cx and likely TEE today  LOS: 4 days    SIMON,SPENCER E 07/13/2011, 5:13 AM  I have seen and examined the patient along with SIMON,SPENCER E, PA.  I have reviewed the chart, notes and new data.  I agree with NP's note.  Key new complaints: alert, oriented, weak but without pain, dyspnea or focal neuro complaints Key examination changes: no peripheral stigmata of endocarditis are identified; remains in complete heart block Key new findings / data: WBC lower, sinusitis on CT head, CT chest nondiagnostic - does not support sarcoidosis; BCx x 2 from Jan 19 +ve for Strep viridans, but BCx x 2 from Jan 21 -ve to date.  PLAN: Major differential diagnosis is primary conduction system disease (familial Lenegre sd.) versus Strep endocarditis with myocardial abscess as cause of complete heart block.  He reports serious infection of second right upper incisor (incomplete root canal and cracked tooth), but this happened a year ago.  TEE today. If no evidence of SBE, plan PPM on Friday (that will allow second set of BCx to be 66 days old). If evidence of SBE, consult CVTS if there is a periannular abscess and delay permanent pacemaker (if that is the case will have to move TPW to a right IJ location).   Thurmon Fair, MD, [facility] [phone] 07/13/2011, 8:02 AM

## 2011-07-13 NOTE — Procedures (Signed)
INDICATIONS: infective endocarditis  PROCEDURE:   Informed consent was obtained prior to the procedure. The risks, benefits and alternatives for the procedure were discussed and the patient comprehended these risks.  Risks include, but are not limited to, cough, sore throat, vomiting, nausea, somnolence, esophageal and stomach trauma or perforation, bleeding, low blood pressure, aspiration, pneumonia, infection, trauma to the teeth and death.    After a procedural time-out, the oropharynx was anesthetized with 20% benzocaine spray. The patient was given 4 mg versed and 50 mcg fentanyl for moderate sedation.   The transesophageal probe was inserted in the esophagus and stomach without difficulty and multiple views were obtained.  The patient was kept under observation until the patient left the procedure room.  The patient left the procedure room in stable condition.   Agitated microbubble saline contrast was not administered.  COMPLICATIONS:    There were no immediate complications.  FINDINGS:  Normal study. No evidence of vegetations or perivalvular abscess.   RECOMMENDATIONS:   Permanent pacemaker on Friday if blood cultures from 01/21 remain negative.  Time Spent Directly with the Patient:  40 minutes   Evonna Stoltz 07/13/2011, 1:20 PM

## 2011-07-13 NOTE — Progress Notes (Signed)
TEE performed.  Paizley Ramella Johnson, RDCS 

## 2011-07-14 DIAGNOSIS — J9600 Acute respiratory failure, unspecified whether with hypoxia or hypercapnia: Secondary | ICD-10-CM

## 2011-07-14 DIAGNOSIS — I469 Cardiac arrest, cause unspecified: Secondary | ICD-10-CM

## 2011-07-14 LAB — CBC
HCT: 37.7 % — ABNORMAL LOW (ref 39.0–52.0)
Hemoglobin: 12.9 g/dL — ABNORMAL LOW (ref 13.0–17.0)
MCH: 30.6 pg (ref 26.0–34.0)
MCHC: 34.2 g/dL (ref 30.0–36.0)
MCV: 89.5 fL (ref 78.0–100.0)
Platelets: 157 10*3/uL (ref 150–400)
RBC: 4.21 MIL/uL — ABNORMAL LOW (ref 4.22–5.81)
RDW: 13.8 % (ref 11.5–15.5)
WBC: 10.9 10*3/uL — ABNORMAL HIGH (ref 4.0–10.5)

## 2011-07-14 LAB — GLUCOSE, CAPILLARY
Glucose-Capillary: 96 mg/dL (ref 70–99)
Glucose-Capillary: 89 mg/dL (ref 70–99)
Glucose-Capillary: 110 mg/dL — ABNORMAL HIGH (ref 70–99)

## 2011-07-14 LAB — GENTAMICIN LEVEL, PEAK: Gentamicin Pk: 2.5 ug/mL — ABNORMAL LOW (ref 5.0–10.0)

## 2011-07-14 LAB — BASIC METABOLIC PANEL
BUN: 18 mg/dL (ref 6–23)
CO2: 28 mEq/L (ref 19–32)
Calcium: 8 mg/dL — ABNORMAL LOW (ref 8.4–10.5)
Chloride: 108 mEq/L (ref 96–112)
Creatinine, Ser: 0.77 mg/dL (ref 0.50–1.35)
GFR calc Af Amer: >90 mL/min (ref >90)
GFR calc non Af Amer: >90 mL/min (ref >90)
Glucose, Bld: 118 mg/dL — ABNORMAL HIGH (ref 70–99)
Potassium: 3.6 mEq/L (ref 3.5–5.1)
Sodium: 142 mEq/L (ref 135–145)

## 2011-07-14 LAB — SEDIMENTATION RATE: Sed Rate: 30 mm/hr — ABNORMAL HIGH (ref 0–16)

## 2011-07-14 MED ORDER — SODIUM CHLORIDE 0.45 % IV SOLN
INTRAVENOUS | Status: DC
  Administered 2011-07-15: 50 mL/h via INTRAVENOUS

## 2011-07-14 MED ORDER — GENTAMICIN SULFATE 40 MG/ML IJ SOLN: 220.0000 mg | INTRAMUSCULAR | Status: DC

## 2011-07-14 MED ORDER — CEFAZOLIN SODIUM-DEXTROSE 2-3 GM-% IV SOLR
2.0000 g | INTRAVENOUS | Status: DC
  Filled 2011-07-14: qty 50

## 2011-07-14 MED ORDER — POTASSIUM CHLORIDE 10 MEQ/50ML IV SOLN
10.0000 meq | INTRAVENOUS | Status: AC
  Administered 2011-07-14 (×3): 10 meq via INTRAVENOUS
  Filled 2011-07-14 (×3): qty 50

## 2011-07-14 MED ORDER — SODIUM CHLORIDE 0.9 % IR SOLN
80.0000 mg | Status: DC
  Filled 2011-07-14: qty 2

## 2011-07-14 MED ORDER — SODIUM CHLORIDE 0.9 % IV SOLN
INTRAVENOUS | Status: DC
  Administered 2011-07-15: 50 mL/h via INTRAVENOUS

## 2011-07-14 MED ORDER — CHLORHEXIDINE GLUCONATE 4 % EX LIQD
60.0000 mL | Freq: Once | CUTANEOUS | Status: AC
  Administered 2011-07-14: 4 via TOPICAL
  Filled 2011-07-14: qty 15

## 2011-07-14 MED ORDER — CHLORHEXIDINE GLUCONATE 4 % EX LIQD
60.0000 mL | Freq: Once | CUTANEOUS | Status: AC
  Administered 2011-07-15: 4 via TOPICAL
  Filled 2011-07-14: qty 60

## 2011-07-14 MED ORDER — GENTAMICIN SULFATE 40 MG/ML IJ SOLN
220.0000 mg | INTRAVENOUS | Status: DC
  Administered 2011-07-14: 220 mg via INTRAVENOUS
  Filled 2011-07-14 (×2): qty 5.5

## 2011-07-14 MED ORDER — DEXTROSE 5 % IV SOLN
220.0000 mg | Freq: Once | INTRAVENOUS | Status: DC
  Filled 2011-07-14: qty 5.5

## 2011-07-14 NOTE — Progress Notes (Addendum)
INFECTIOUS DISEASE PROGRESS NOTE    Date of Admission:  07/09/2011  Antibiotics:  Vanc 1/20 --> 1/21  Gentamicin 1/22 -->  Ceftriaxone 1 g dose 1/20 --> 1/21 2 g dose 1/22   Problem List:  1. V. Fib. Cardiac arrest  2. Respiratory arrest  3. Complete heart block now pacer dependent  4. Strep viridans positive blood culture  5. NSTEMI    . cefTRIAXone (ROCEPHIN)  IV  2 g Intravenous Q24H  . fentaNYL  250 mcg Intravenous Once  . furosemide  20 mg Intravenous Daily  . gentamicin  80 mg Intravenous Q8H  . midazolam  10 mg Intravenous Once  . potassium chloride  10 mEq Intravenous Q1 Hr x 4  . sodium chloride  10 mL Intravenous BID  . sodium chloride  3 mL Intravenous Q12H  . DISCONTD: chlorhexidine  15 mL Mouth/Throat BID  . DISCONTD: sodium chloride  3 mL Intravenous Q12H   Subjective: Feeling better today.  Weak but is eating well.  Denies fever, chills, nausea, or ringing in the ears.   Objective: Temp:  [97.9 F (36.6 C)-98.6 F (37 C)] 98 F (36.7 C) (01/24 0823) Pulse Rate:  [59-65] 65  (01/24 0823) BP: (115-139)/(65-75) 115/75 mmHg (01/24 0823) SpO2:  [94 %-100 %] 98 % (01/24 0823)  Vitals reviewed. General: resting in bed, NAD HEENT: PERRL, EOMI, no scleral icterus Cardiac: RRR, no rubs, murmurs or gallops Pulm: clear to auscultation bilaterally, no wheezes, rales, or rhonchi Abd: soft, nontender, nondistended, BS present Ext: warm and well perfused, no pedal edema Neuro: alert and oriented X3, cranial nerves II-XII grossly intact, strength and sensation to light touch equal in bilateral upper and lower extremities  Lab Results Lab Results  Component Value Date   WBC 10.9* 07/14/2011   HGB 12.9* 07/14/2011   HCT 37.7* 07/14/2011   MCV 89.5 07/14/2011   PLT 157 07/14/2011    Lab Results  Component Value Date   CREATININE 0.77 07/14/2011   BUN 18 07/14/2011   NA 142 07/14/2011   K 3.6 07/14/2011   CL 108 07/14/2011   CO2 28 07/14/2011    Lab Results    Component Value Date   ALT 63* 07/12/2011   AST 36 07/12/2011   ALKPHOS 77 07/12/2011   BILITOT 0.6 07/12/2011     Microbiology: Recent Results (from the past 240 hour(s))  MRSA PCR SCREENING     Status: Normal   Collection Time   07/09/11  2:51 PM      Component Value Range Status Comment   MRSA by PCR NEGATIVE  NEGATIVE  Final   CULTURE, BLOOD (ROUTINE X 2)     Status: Normal   Collection Time   07/09/11  3:08 PM      Component Value Range Status Comment   Specimen Description BLOOD LEFT HAND   Final    Special Requests BOTTLES DRAWN AEROBIC ONLY 5CC   Final    Setup Time [id number]   Final    Culture     Final    Value: VIRIDANS STREPTOCOCCUS     Note: Gram Stain Report Called to,Read Back By and Verified With: RN D. SULLIVAN ON 07/10/11 AT 1557 BY TEDAR   Report Status 07/12/2011 FINAL   Final    Organism ID, Bacteria VIRIDANS STREPTOCOCCUS   Final   CULTURE, BLOOD (ROUTINE X 2)     Status: Normal   Collection Time   07/09/11  3:47 PM  Component Value Range Status Comment   Specimen Description BLOOD LEFT ARM   Final    Special Requests BOTTLES DRAWN AEROBIC ONLY 10CC   Final    Setup Time [id number]   Final    Culture     Final    Value: VIRIDANS STREPTOCOCCUS     Note: SUSCEPTIBILITIES PERFORMED ON PREVIOUS CULTURE WITHIN THE LAST 5 DAYS.     Note: Gram Stain Report Called to,Read Back By and Verified With: RN D. SULLIVAN ON 07/10/11 AT 1000 BY TEDAR   Report Status 07/12/2011 FINAL   Final   URINE CULTURE     Status: Normal   Collection Time   07/09/11 10:28 PM      Component Value Range Status Comment   Specimen Description URINE, RANDOM   Final    Special Requests NONE   Final    Setup Time [id number]   Final    Colony Count NO GROWTH   Final    Culture NO GROWTH   Final    Report Status 07/10/2011 FINAL   Final   CULTURE, RESPIRATORY     Status: Normal   Collection Time   07/10/11 12:48 PM      Component Value Range Status Comment   Specimen Description  TRACHEAL ASPIRATE   Final    Special Requests NONE   Final    Gram Stain     Final    Value: ABUNDANT WBC PRESENT,BOTH PMN AND MONONUCLEAR     NO SQUAMOUS EPITHELIAL CELLS SEEN     ABUNDANT GRAM POSITIVE RODS     FEW GRAM NEGATIVE COCCOBACILLI   Culture Non-Pathogenic Oropharyngeal-type Flora Isolated.   Final    Report Status 07/12/2011 FINAL   Final   CULTURE, BLOOD (ROUTINE X 2)     Status: Normal (Preliminary result)   Collection Time   07/11/11 12:48 PM      Component Value Range Status Comment   Specimen Description BLOOD LEFT ARM   Final    Special Requests BOTTLES DRAWN AEROBIC AND ANAEROBIC 10CC   Final    Setup Time [id number]   Final    Culture     Final    Value:        BLOOD CULTURE RECEIVED NO GROWTH TO DATE CULTURE WILL BE HELD FOR 5 DAYS BEFORE ISSUING A FINAL NEGATIVE REPORT   Report Status PENDING   Incomplete   CULTURE, BLOOD (ROUTINE X 2)     Status: Normal (Preliminary result)   Collection Time   07/11/11 12:57 PM      Component Value Range Status Comment   Specimen Description BLOOD LEFT FOREARM   Final    Special Requests BOTTLES DRAWN AEROBIC ONLY 5CC   Final    Setup Time [id number]   Final    Culture     Final    Value:        BLOOD CULTURE RECEIVED NO GROWTH TO DATE CULTURE WILL BE HELD FOR 5 DAYS BEFORE ISSUING A FINAL NEGATIVE REPORT   Report Status PENDING   Incomplete    Studies/Results: Ct Chest W Contrast  07/12/2011  *RADIOLOGY REPORT*  Clinical Data: Sarcoid  CT CHEST WITH CONTRAST  Technique:  Multidetector CT imaging of the chest was performed following the standard protocol during bolus administration of intravenous contrast.  Contrast: 80mL OMNIPAQUE IOHEXOL 300 MG/ML IV SOLN  Comparison: None.  Findings: No evidence of abnormal mediastinal adenopathy.  Pacemaker lead extending from the IVC is present with  its tip at the apex of the right ventricle.  Left internal jugular vein central venous catheter tip is in the left innominate vein.  Small  bilateral pleural effusions right greater than left. Bibasilar dependent consolidation primarily in the lower lobes bilaterally.  No peripheral or peribronchial nodularity to suggest interstitial sarcoid lung disease.  IMPRESSION: Bilateral pleural effusions and bibasilar consolidation. The pattern is suspicious for aspiration.  No evidence of mediastinal adenopathy.  Original Report Authenticated By: Donavan Burnet, M.D.   Ct Maxillofacial Wo Cm  07/12/2011  *RADIOLOGY REPORT*  Clinical Data: Mean  CT MAXILLOFACIAL WITHOUT CONTRAST  Technique:  Multidetector CT imaging of the maxillofacial structures was performed. Multiplanar CT image reconstructions were also generated.  Comparison: None.  Findings: Air fluid levels are present in the bilateral maxillary sinuses, and bilateral sphenoid sinuses.  Mucosal thickening throughout the paranasal sinuses is present.  There is no bony destruction.  Mastoid air cells are clear.  Ostiomeatal complexes are clear.  No obvious evidence of mass effect within the soft tissue spaces of the head or neck.  Airway is patent. Epiglottis is unremarkable.  IMPRESSION: Inflammatory changes throughout the paranasal sinuses as described. Air-fluid levels are present in the bilateral maxillary and sphenoid sinuses  Original Report Authenticated By: Donavan Burnet, M.D.   Transesophageal Echocardiogram: - Left ventricle: Systolic function was normal. Wall motion was normal; there were no regional wall motion abnormalities. - Aortic valve: No evidence of vegetation. - Mitral valve: No evidence of vegetation. - Left atrium: No evidence of thrombus in the atrial cavity or appendage. - Right atrium: No evidence of thrombus in the atrial cavity or appendage. - Atrial septum: There was a valve-incompetent patent foramen ovale. There was redundancy of the septum, with borderline criteria for aneurysm. - Tricuspid valve: No evidence of vegetation. - Pulmonic valve: No evidence of  vegetation.  Assessment: 1. Strep viridans bacteremia: Blood culture on admission was positive 2/2 bottles for Strep viridans. While this could represent a transient bacteremia the concern for native valve endocarditis is a very real possibility. On speaking with the wife she noted that he had been feeling ill since the end of November/beginning of December with flu like symptoms. They initially improved but he had continued weakness, cough, and dyspnea on exertion. CT of the facial bones was positive for sinusitis and negative for dental lesions. TEE was done and showed no vegetations.  He has remained afebrile and his WBC is continuing to trend downward. Given the concern for native valve endocarditis with embolization as a possible cause of his primary arrhythmia and the fact that his viridans strep is penicillin sensitive we will continue to treat with Rocephin 2 g daily and Gentamicin per the [facility] recommendations for a total of 2 weeks from the first negative blood culture.  Currently the culture from 1/21 is still negative.    Plan: 1. Continue current doses of Rocephin and Gentamicin 2. Continue to follow the blood culture results.  3. Monitoring while on Gentamicin would be weekly trough levels as well as twice weekly Bmet to monitor his kidney function per pharmacy recommendations.   PRIBULA,CHRISTOPHER 07/14/2011 11:13 AM    Attending addendum: He appears to be responding to his antibiotic therapy. I not absolutely convinced that he had endocarditis agree with continue his current therapy for at least 10 more days for at least a total of 14 days of therapy. We will need to monitor his renal function carefully. His most recent blood cultures are  negative so it should be safe to proceed with permanent pacemaker placement tomorrow.  Cliffton Asters, MD [facility] [phone] pager   [phone] cell 07/14/2011, 5:00 PM

## 2011-07-14 NOTE — Progress Notes (Signed)
ANTIBIOTIC CONSULT NOTE - FOLLOW UP  Pharmacy Consult for Gentamicin Indication: endocarditis  No Known Allergies  Patient Measurements: Height: 5\' 11"  (180.3 cm) Weight: 160 lb 11.5 oz (72.9 kg) IBW/kg (Calculated) : 75.3  Adjusted Body Weight:   Vital Signs: Temp: 97.7 F (36.5 C) (01/24 1935) Temp src: Oral (01/24 1935) BP: 135/79 mmHg (01/24 1300) Pulse Rate: 62  (01/24 2000) Intake/Output from previous day: 01/23 0701 - 01/24 0700 In: 920 [P.O.:120; I.V.:550; IV Piggyback:250] Out: 3575 [Urine:3575] Intake/Output from this shift: Total I/O In: 20 [I.V.:20] Out: -   Labs:  Basename 07/14/11 0400 07/13/11 0351 07/12/11 0415  WBC 10.9* 10.9* 13.5*  HGB 12.9* 12.5* 12.6*  PLT 157 127* 143*  LABCREA -- -- --  CREATININE 0.77 0.77 0.98   Estimated Creatinine Clearance: 105 ml/min (by C-G formula based on Cr of 0.77).  Basename 07/14/11 1430  VANCOTROUGH --  Leodis Binet --  Drue Dun --  GENTTROUGH --  GENTPEAK 2.5*  GENTRANDOM --  TOBRATROUGH --  TOBRAPEAK --  TOBRARND --  AMIKACINPEAK --  AMIKACINTROU --  AMIKACIN --     Microbiology: Recent Results (from the past 720 hour(s))  MRSA PCR SCREENING     Status: Normal   Collection Time   07/09/11  2:51 PM      Component Value Range Status Comment   MRSA by PCR NEGATIVE  NEGATIVE  Final   CULTURE, BLOOD (ROUTINE X 2)     Status: Normal   Collection Time   07/09/11  3:08 PM      Component Value Range Status Comment   Specimen Description BLOOD LEFT HAND   Final    Special Requests BOTTLES DRAWN AEROBIC ONLY 5CC   Final    Setup Time [id number]   Final    Culture     Final    Value: VIRIDANS STREPTOCOCCUS     Note: Gram Stain Report Called to,Read Back By and Verified With: RN D. SULLIVAN ON 07/10/11 AT 1557 BY TEDAR   Report Status 07/12/2011 FINAL   Final    Organism ID, Bacteria VIRIDANS STREPTOCOCCUS   Final   CULTURE, BLOOD (ROUTINE X 2)     Status: Normal   Collection Time   07/09/11  3:47 PM        Component Value Range Status Comment   Specimen Description BLOOD LEFT ARM   Final    Special Requests BOTTLES DRAWN AEROBIC ONLY 10CC   Final    Setup Time [id number]   Final    Culture     Final    Value: VIRIDANS STREPTOCOCCUS     Note: SUSCEPTIBILITIES PERFORMED ON PREVIOUS CULTURE WITHIN THE LAST 5 DAYS.     Note: Gram Stain Report Called to,Read Back By and Verified With: RN D. SULLIVAN ON 07/10/11 AT 1000 BY TEDAR   Report Status 07/12/2011 FINAL   Final   URINE CULTURE     Status: Normal   Collection Time   07/09/11 10:28 PM      Component Value Range Status Comment   Specimen Description URINE, RANDOM   Final    Special Requests NONE   Final    Setup Time [id number]   Final    Colony Count NO GROWTH   Final    Culture NO GROWTH   Final    Report Status 07/10/2011 FINAL   Final   CULTURE, RESPIRATORY     Status: Normal   Collection Time   07/10/11 12:48 PM  Component Value Range Status Comment   Specimen Description TRACHEAL ASPIRATE   Final    Special Requests NONE   Final    Gram Stain     Final    Value: ABUNDANT WBC PRESENT,BOTH PMN AND MONONUCLEAR     NO SQUAMOUS EPITHELIAL CELLS SEEN     ABUNDANT GRAM POSITIVE RODS     FEW GRAM NEGATIVE COCCOBACILLI   Culture Non-Pathogenic Oropharyngeal-type Flora Isolated.   Final    Report Status 07/12/2011 FINAL   Final   CULTURE, BLOOD (ROUTINE X 2)     Status: Normal (Preliminary result)   Collection Time   07/11/11 12:48 PM      Component Value Range Status Comment   Specimen Description BLOOD LEFT ARM   Final    Special Requests BOTTLES DRAWN AEROBIC AND ANAEROBIC 10CC   Final    Setup Time [id number]   Final    Culture     Final    Value:        BLOOD CULTURE RECEIVED NO GROWTH TO DATE CULTURE WILL BE HELD FOR 5 DAYS BEFORE ISSUING A FINAL NEGATIVE REPORT   Report Status PENDING   Incomplete   CULTURE, BLOOD (ROUTINE X 2)     Status: Normal (Preliminary result)   Collection Time   07/11/11 12:57 PM       Component Value Range Status Comment   Specimen Description BLOOD LEFT FOREARM   Final    Special Requests BOTTLES DRAWN AEROBIC ONLY 5CC   Final    Setup Time [id number]   Final    Culture     Final    Value:        BLOOD CULTURE RECEIVED NO GROWTH TO DATE CULTURE WILL BE HELD FOR 5 DAYS BEFORE ISSUING A FINAL NEGATIVE REPORT   Report Status PENDING   Incomplete     Anti-infectives     Start     Dose/Rate Route Frequency Ordered Stop   07/15/11 0700   gentamicin (GARAMYCIN) 80 mg in sodium chloride irrigation 0.9 % 500 mL irrigation        80 mg Irrigation On call 07/14/11 1135 07/16/11 0700   07/15/11 0700   ceFAZolin (ANCEF) IVPB 2 g/50 mL premix        2 g 100 mL/hr over 30 Minutes Intravenous On call 07/14/11 1135 07/16/11 0700   07/12/11 1300   cefTRIAXone (ROCEPHIN) 2 g in dextrose 5 % 50 mL IVPB        2 g 100 mL/hr over 30 Minutes Intravenous Every 24 hours 07/11/11 1138     07/12/11 1200   gentamicin (GARAMYCIN) 75.2 mg in dextrose 5 % 50 mL IVPB  Status:  Discontinued        1 mg/kg  75.3 kg (Ideal) 103.8 mL/hr over 30 Minutes Intravenous Every 8 hours 07/12/11 0933 07/12/11 0941   07/12/11 1200   gentamicin (GARAMYCIN) IVPB 80 mg     Comments: 3mg /kg/day      80 mg 100 mL/hr over 30 Minutes Intravenous Every 8 hours 07/12/11 0942     07/12/11 0915   gentamicin (GARAMYCIN) 389.6 mg in dextrose 5 % 50 mL IVPB  Status:  Discontinued        5 mg/kg  77.9 kg 119.5 mL/hr over 30 Minutes Intravenous  Once 07/12/11 0915 07/12/11 0928   07/11/11 1300   cefTRIAXone (ROCEPHIN) 1 g in dextrose 5 % 50 mL IVPB        1 g  100 mL/hr over 30 Minutes Intravenous  Once 07/11/11 1138 07/11/11 1357   07/10/11 1300   vancomycin (VANCOCIN) IVPB 1000 mg/200 mL premix  Status:  Discontinued        1,000 mg 200 mL/hr over 60 Minutes Intravenous Every 8 hours 07/10/11 1158 07/11/11 0952   07/10/11 1100   cefTRIAXone (ROCEPHIN) 1 g in dextrose 5 % 50 mL IVPB  Status:  Discontinued         1 g 100 mL/hr over 30 Minutes Intravenous Every 24 hours 07/10/11 1018 07/11/11 1336          Assessment: 55yom on gentamicin and ceftriaxone for suspected endocarditis with 2/2 blood cultures positive for strep viridans. Afebrile, WBC trending down, most recent blood cxs NGTD, TEE performed 1/23 showed no abscess/vegetations. Pt was receiving gentamicin 80mg  (1mg /kg) q8h. Gentamicin trough scheduled for 1930 today was never drawn and 2000 gentamicin dose not yet hung. Per IDSA guidelines, can give synergy gentamicin once daily (3mg /kg). SCr stable, UOP good.   Plan:  1. Change gentamicin to 220mg  IV q24h.  2. Discontinue gentamicin trough 3. Monitor renal function, UOP - obtain trough if significant change 4. Continue to monitor clinical status, ID recommendations and adjust regimen as indicated  Cleon Dew [phone] 07/14/2011,11:04 PM
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
## 2011-07-14 NOTE — Progress Notes (Signed)
ANTIBIOTIC CONSULT NOTE - FOLLOW UP  Pharmacy Consult for Gentamicin Indication: endocarditis  No Known Allergies  Patient Measurements: Height: 5\' 11"  (180.3 cm) Weight: 160 lb 11.5 oz (72.9 kg) IBW/kg (Calculated) : 75.3    Vital Signs: Temp: 98 F (36.7 C) (01/24 1253) Temp src: Oral (01/24 1253) BP: 135/79 mmHg (01/24 1300) Pulse Rate: 62  (01/24 1253) Intake/Output from previous day: 01/23 0701 - 01/24 0700 In: 920 [P.O.:120; I.V.:550; IV Piggyback:250] Out: 3575 [Urine:3575] Intake/Output from this shift: Total I/O In: 470 [P.O.:360; I.V.:60; IV Piggyback:50] Out: 300 [Urine:300]  Labs:  [facility] 07/14/11 0400 07/13/11 0351 07/12/11 0415  WBC 10.9* 10.9* 13.5*  HGB 12.9* 12.5* 12.6*  PLT 157 127* 143*  LABCREA -- -- --  CREATININE 0.77 0.77 0.98   Estimated Creatinine Clearance: 105 ml/min (by C-G formula based on Cr of 0.77). No results found for this basename: VANCOTROUGH:2,VANCOPEAK:2,VANCORANDOM:2,GENTTROUGH:2,GENTPEAK:2,GENTRANDOM:2,TOBRATROUGH:2,TOBRAPEAK:2,TOBRARND:2,AMIKACINPEAK:2,AMIKACINTROU:2,AMIKACIN:2, in the last 72 hours   Microbiology: Recent Results (from the past 720 hour(s))  MRSA PCR SCREENING     Status: Normal   Collection Time   07/09/11  2:51 PM      Component Value Range Status Comment   MRSA by PCR NEGATIVE  NEGATIVE  Final   CULTURE, BLOOD (ROUTINE X 2)     Status: Normal   Collection Time   07/09/11  3:08 PM      Component Value Range Status Comment   Specimen Description BLOOD LEFT HAND   Final    Special Requests BOTTLES DRAWN AEROBIC ONLY 5CC   Final    Setup Time [id number]   Final    Culture     Final    Value: VIRIDANS STREPTOCOCCUS     Note: Gram Stain Report Called to,Read Back By and Verified With: RN D. SULLIVAN ON 07/10/11 AT 1557 BY TEDAR   Report Status 07/12/2011 FINAL   Final    Organism ID, Bacteria VIRIDANS STREPTOCOCCUS   Final   CULTURE, BLOOD (ROUTINE X 2)     Status: Normal   Collection Time   07/09/11   3:47 PM      Component Value Range Status Comment   Specimen Description BLOOD LEFT ARM   Final    Special Requests BOTTLES DRAWN AEROBIC ONLY 10CC   Final    Setup Time [id number]   Final    Culture     Final    Value: VIRIDANS STREPTOCOCCUS     Note: SUSCEPTIBILITIES PERFORMED ON PREVIOUS CULTURE WITHIN THE LAST 5 DAYS.     Note: Gram Stain Report Called to,Read Back By and Verified With: RN D. SULLIVAN ON 07/10/11 AT 1000 BY TEDAR   Report Status 07/12/2011 FINAL   Final   URINE CULTURE     Status: Normal   Collection Time   07/09/11 10:28 PM      Component Value Range Status Comment   Specimen Description URINE, RANDOM   Final    Special Requests NONE   Final    Setup Time [id number]   Final    Colony Count NO GROWTH   Final    Culture NO GROWTH   Final    Report Status 07/10/2011 FINAL   Final   CULTURE, RESPIRATORY     Status: Normal   Collection Time   07/10/11 12:48 PM      Component Value Range Status Comment   Specimen Description TRACHEAL ASPIRATE   Final    Special Requests NONE   Final    Gram Stain  Final    Value: ABUNDANT WBC PRESENT,BOTH PMN AND MONONUCLEAR     NO SQUAMOUS EPITHELIAL CELLS SEEN     ABUNDANT GRAM POSITIVE RODS     FEW GRAM NEGATIVE COCCOBACILLI   Culture Non-Pathogenic Oropharyngeal-type Flora Isolated.   Final    Report Status 07/12/2011 FINAL   Final   CULTURE, BLOOD (ROUTINE X 2)     Status: Normal (Preliminary result)   Collection Time   07/11/11 12:48 PM      Component Value Range Status Comment   Specimen Description BLOOD LEFT ARM   Final    Special Requests BOTTLES DRAWN AEROBIC AND ANAEROBIC 10CC   Final    Setup Time [id number]   Final    Culture     Final    Value:        BLOOD CULTURE RECEIVED NO GROWTH TO DATE CULTURE WILL BE HELD FOR 5 DAYS BEFORE ISSUING A FINAL NEGATIVE REPORT   Report Status PENDING   Incomplete   CULTURE, BLOOD (ROUTINE X 2)     Status: Normal (Preliminary result)   Collection Time   07/11/11 12:57 PM       Component Value Range Status Comment   Specimen Description BLOOD LEFT FOREARM   Final    Special Requests BOTTLES DRAWN AEROBIC ONLY 5CC   Final    Setup Time [id number]   Final    Culture     Final    Value:        BLOOD CULTURE RECEIVED NO GROWTH TO DATE CULTURE WILL BE HELD FOR 5 DAYS BEFORE ISSUING A FINAL NEGATIVE REPORT   Report Status PENDING   Incomplete     Anti-infectives     Start     Dose/Rate Route Frequency Ordered Stop   07/15/11 0700   gentamicin (GARAMYCIN) 80 mg in sodium chloride irrigation 0.9 % 500 mL irrigation        80 mg Irrigation On call 07/14/11 1135 07/16/11 0700   07/15/11 0700   ceFAZolin (ANCEF) IVPB 2 g/50 mL premix        2 g 100 mL/hr over 30 Minutes Intravenous On call 07/14/11 1135 07/16/11 0700   07/12/11 1300   cefTRIAXone (ROCEPHIN) 2 g in dextrose 5 % 50 mL IVPB        2 g 100 mL/hr over 30 Minutes Intravenous Every 24 hours 07/11/11 1138     07/12/11 1200   gentamicin (GARAMYCIN) 75.2 mg in dextrose 5 % 50 mL IVPB  Status:  Discontinued        1 mg/kg  75.3 kg (Ideal) 103.8 mL/hr over 30 Minutes Intravenous Every 8 hours 07/12/11 0933 07/12/11 0941   07/12/11 1200   gentamicin (GARAMYCIN) IVPB 80 mg     Comments: 3mg /kg/day      80 mg 100 mL/hr over 30 Minutes Intravenous Every 8 hours 07/12/11 0942     07/12/11 0915   gentamicin (GARAMYCIN) 389.6 mg in dextrose 5 % 50 mL IVPB  Status:  Discontinued        5 mg/kg  77.9 kg 119.5 mL/hr over 30 Minutes Intravenous  Once 07/12/11 0915 07/12/11 0928   07/11/11 1300   cefTRIAXone (ROCEPHIN) 1 g in dextrose 5 % 50 mL IVPB        1 g 100 mL/hr over 30 Minutes Intravenous  Once 07/11/11 1138 07/11/11 1357   07/10/11 1300   vancomycin (VANCOCIN) IVPB 1000 mg/200 mL premix  Status:  Discontinued  1,000 mg 200 mL/hr over 60 Minutes Intravenous Every 8 hours 07/10/11 1158 07/11/11 0952   07/10/11 1100   cefTRIAXone (ROCEPHIN) 1 g in dextrose 5 % 50 mL IVPB  Status:   Discontinued        1 g 100 mL/hr over 30 Minutes Intravenous Every 24 hours 07/10/11 1018 07/11/11 1336          Assessment: 55 yom presented to [facility] after vfib arrest. Patient was then found with 2/2 blood cxs positive for strep viridans. TEE performed 1/23 showed no abscess/vegetations. Patient is currently on day #4 ceftriaxone and #3 gentamicin. WBC has trended down to 10.9 today. Afebrille. 1/21 blood cx-ngtd.   Will check gent peak and trough today with expectations of full 2 weeks of abx therapy. PPM planned for 07/15/11.  Goal of Therapy:  Goal peak 3-4  Goal trough <1  Plan:  Gent peak and trough today-adjust dosing accordingly Follow blood cx results.  Severiano Gilbert 07/14/2011,1:24 PM

## 2011-07-14 NOTE — Progress Notes (Signed)
Subjective:  No  acute concerns overnight, pt afebrile and without any resp concerns  Objective:  Vital Signs in the last 24 hours: Temp:  [97.9 F (36.6 C)-98.6 F (37 C)] 98.6 F (37 C) (01/24 0358) Pulse Rate:  [59-65] 62  (01/24 0500) Resp:  [22-24] 24  (01/23 0900) BP: (124-150)/(65-76) 124/65 mmHg (01/23 1648) SpO2:  [95 %-100 %] 95 % (01/24 0500)  Intake/Output from previous day: 01/23 0701 - 01/24 0700 In: 880 [P.O.:120; I.V.:510; IV Piggyback:250] Out: 3500 [Urine:3500] Intake/Output from this shift: Total I/O In: 300 [I.V.:200; IV Piggyback:100] Out: 425 [Urine:425]  Physical Exam: PE not performed  Lab Results:  Basename 07/14/11 0400 07/13/11 0351  WBC 10.9* 10.9*  HGB 12.9* 12.5*  PLT 157 127*    Basename 07/14/11 0400 07/13/11 0351  NA 142 140  K 3.6 4.1  CL 108 106  CO2 28 28  GLUCOSE 118* 124*  BUN 18 15  CREATININE 0.77 0.77   No results found for this basename: TROPONINI:2,CK,MB:2 in the last 72 hours Hepatic Function Panel  Basename 07/12/11 0415  PROT 5.8*  ALBUMIN 2.5*  AST 36  ALT 63*  ALKPHOS 77  BILITOT 0.6  BILIDIR 0.1  IBILI 0.5   No results found for this basename: CHOL in the last 72 hours No results found for this basename: PROTIME in the last 72 hours  Imaging: Imaging results have been reviewed  Cardiac Studies:  Assessment/Plan:  1) S/p Cardiac arrest 2) VDRF (resolved) 3) CHB, with temp wire, likely PPM tomorrow with negative BC  LOS: 5 days    SIMON,SPENCER E 07/14/2011, 6:20 AM  Agree with note written by Donell Sievert PAC  Pacing at 62. VSS. WBCs declining. Afeb. TEE normal. For PTVPM tomorrow.  Daryon Remmert J 07/14/2011 8:16 AM

## 2011-07-15 ENCOUNTER — Inpatient Hospital Stay (HOSPITAL_COMMUNITY): Payer: Medicaid Other

## 2011-07-15 ENCOUNTER — Encounter (HOSPITAL_COMMUNITY)
Admission: EM | Disposition: A | Discharge status: Home Health | Payer: Self-pay | Source: Home / Self Care | Attending: Internal Medicine

## 2011-07-15 DIAGNOSIS — Z950 Presence of cardiac pacemaker: Secondary | ICD-10-CM | POA: Insufficient documentation

## 2011-07-15 DIAGNOSIS — Z45010 Encounter for checking and testing of cardiac pacemaker pulse generator [battery]: Secondary | ICD-10-CM | POA: Insufficient documentation

## 2011-07-15 HISTORY — DX: Presence of cardiac pacemaker: Z95.0

## 2011-07-15 HISTORY — PX: PACEMAKER INSERTION: SHX728

## 2011-07-15 HISTORY — PX: PERMANENT PACEMAKER INSERTION: SHX5480

## 2011-07-15 LAB — BASIC METABOLIC PANEL
BUN: 26 mg/dL — ABNORMAL HIGH (ref 6–23)
CO2: 26 mEq/L (ref 19–32)
Calcium: 8.3 mg/dL — ABNORMAL LOW (ref 8.4–10.5)
Chloride: 106 mEq/L (ref 96–112)
Creatinine, Ser: 0.89 mg/dL (ref 0.50–1.35)
GFR calc Af Amer: >90 mL/min (ref >90)
GFR calc non Af Amer: >90 mL/min (ref >90)
Glucose, Bld: 106 mg/dL — ABNORMAL HIGH (ref 70–99)
Potassium: 4.1 mEq/L (ref 3.5–5.1)
Sodium: 140 mEq/L (ref 135–145)

## 2011-07-15 LAB — CBC
HCT: 39.6 % (ref 39.0–52.0)
Hemoglobin: 13.6 g/dL (ref 13.0–17.0)
MCH: 30.5 pg (ref 26.0–34.0)
MCHC: 34.3 g/dL (ref 30.0–36.0)
MCV: 88.8 fL (ref 78.0–100.0)
Platelets: 170 10*3/uL (ref 150–400)
RBC: 4.46 MIL/uL (ref 4.22–5.81)
RDW: 13.7 % (ref 11.5–15.5)
WBC: 11.3 10*3/uL — ABNORMAL HIGH (ref 4.0–10.5)

## 2011-07-15 LAB — ANTI-DNA ANTIBODY, DOUBLE-STRANDED: ds DNA Ab: 1 IU/mL (ref <30)

## 2011-07-15 LAB — MAGNESIUM: Magnesium: 2 mg/dL (ref 1.5–2.5)

## 2011-07-15 LAB — GLUCOSE, CAPILLARY
Glucose-Capillary: 108 mg/dL — ABNORMAL HIGH (ref 70–99)
Glucose-Capillary: 90 mg/dL (ref 70–99)

## 2011-07-15 LAB — C3 COMPLEMENT: C3 Complement: 122 mg/dL (ref 90–180)

## 2011-07-15 LAB — C4 COMPLEMENT: Complement C4, Body Fluid: 41 mg/dL — ABNORMAL HIGH (ref 10–40)

## 2011-07-15 SURGERY — PERMANENT PACEMAKER INSERTION
Anesthesia: Moderate Sedation | Laterality: Left

## 2011-07-15 MED ORDER — ONDANSETRON HCL 4 MG/2ML IJ SOLN: 4.0000 mg | Freq: Four times a day (QID) | INTRAMUSCULAR | Status: DC | PRN

## 2011-07-15 MED ORDER — DEXTROSE 5 % IV SOLN
2.0000 g | INTRAVENOUS | Status: DC
  Administered 2011-07-16 – 2011-07-18 (×3): 2 g via INTRAVENOUS
  Filled 2011-07-15 (×3): qty 2

## 2011-07-15 MED ORDER — HYDROCODONE-ACETAMINOPHEN 5-325 MG PO TABS
1.0000 | ORAL_TABLET | ORAL | Status: DC | PRN
  Administered 2011-07-15 – 2011-07-16 (×2): 1 via ORAL
  Administered 2011-07-16 – 2011-07-17 (×2): 2 via ORAL
  Filled 2011-07-15: qty 2
  Filled 2011-07-15 (×2): qty 1
  Filled 2011-07-15: qty 2

## 2011-07-15 MED ORDER — LIDOCAINE HCL (PF) 1 % IJ SOLN
INTRAMUSCULAR | Status: AC
  Filled 2011-07-15: qty 60

## 2011-07-15 MED ORDER — GENTAMICIN SULFATE 40 MG/ML IJ SOLN
220.0000 mg | INTRAVENOUS | Status: DC
  Administered 2011-07-15 – 2011-07-16 (×2): 220 mg via INTRAVENOUS
  Filled 2011-07-15 (×4): qty 5.5

## 2011-07-15 MED ORDER — MIDAZOLAM HCL 5 MG/5ML IJ SOLN
INTRAMUSCULAR | Status: AC
Start: 2011-07-15 — End: 2011-07-15
  Filled 2011-07-15: qty 5

## 2011-07-15 MED ORDER — HEPARIN (PORCINE) IN NACL 2-0.9 UNIT/ML-% IJ SOLN
INTRAMUSCULAR | Status: AC
  Filled 2011-07-15: qty 1000

## 2011-07-15 MED ORDER — CEFAZOLIN SODIUM 1-5 GM-% IV SOLN
INTRAVENOUS | Status: AC
  Administered 2011-07-16: 1 g via INTRAVENOUS
  Filled 2011-07-15: qty 50

## 2011-07-15 MED ORDER — CEFAZOLIN SODIUM 1-5 GM-% IV SOLN
1.0000 g | Freq: Four times a day (QID) | INTRAVENOUS | Status: AC
  Administered 2011-07-15 – 2011-07-16 (×3): 1 g via INTRAVENOUS
  Filled 2011-07-15 (×3): qty 50

## 2011-07-15 MED ORDER — SODIUM CHLORIDE 0.9 % IV SOLN: INTRAVENOUS | Status: AC

## 2011-07-15 MED ORDER — SODIUM CHLORIDE 0.9 % IR SOLN
80.0000 mg | Status: DC
  Filled 2011-07-15: qty 2

## 2011-07-15 MED ORDER — FENTANYL CITRATE 0.05 MG/ML IJ SOLN
INTRAMUSCULAR | Status: AC
  Filled 2011-07-15: qty 2

## 2011-07-15 MED ORDER — ACETAMINOPHEN 325 MG PO TABS
325.0000 mg | ORAL_TABLET | ORAL | Status: DC | PRN
  Administered 2011-07-16: 650 mg via ORAL
  Filled 2011-07-15: qty 2

## 2011-07-15 NOTE — Op Note (Signed)
Casey Richard,Demetruis G Male, 56 y.o., 31-Aug-1955  MRN: [id number]  Procedure report  Procedure performed:  1. Implantation of new dual chamber permanent pacemaker 2. Fluoroscopy 3.  Light sedation    Reason for procedure: Syncope due to Complete heart block   Procedure performed by: Thurmon Fair, MD  Complications: None  Estimated blood loss: <10 mL  Medications administered during procedure:  Ancef 1 g intravenously Lidocaine 1% 30 mL locally,  Fentanyl 25 mcg intravenously Versed 2 mg intravenously  Device details:  Generator Medtronic Revo model RV DRO1 serial number [id number] H Right atrial lead Medtronic 5086-45 cm serial number [id number] V Right ventricular lead Medtronic 5086-52 cm serial number [id number]  Procedure details:  After the risks and benefits of the procedure were discussed the patient provided informed consent and was brought to the cardiac cath lab in the fasting state. The patient was prepped and draped in usual sterile fashion. Local anesthesia with 1% lidocaine was administered to to the right infraclavicular area. A 5-6 cm horizontal incision was made parallel with and 2-3 cm caudal to the left clavicle. Using electrocautery and blunt dissection a prepectoral pocket was created down to the level of the pectoralis major muscle fascia. The pocket was carefully inspected for hemostasis. An antibiotic-soaked sponge was placed in the pocket.  Under fluoroscopic guidance and using the modified Seldinger technique 2 separate venipunctures were performed to access the left subclavian vein. no difficulty was encountered accessing the vein.  Two J-tip guidewires were subsequently exchanged for 8 [place] safe sheaths.  Under fluoroscopic guidance the ventricular lead was advanced to level of the mid to apical right ventricular septum and thet active-fixation helix was deployed. Prominent current of injury was seen. Satisfactory pacing and sensing parameters  were recorded. There was no evidence of diaphragmatic stimulation at maximum device output. The safe sheath was peeled away and the lead was secured in place with 2-0 silk.  In similar fashion the right atrial lead was advanced to the level of the atrial appendage. The active-fixation helix was deployed. There was prominent current of injury. Satisfactory  pacing and sensing parameters were recorded. There was no evidence of diaphragmatic stimulation with pacing at maximum device output. The safe sheath was peeled away and the lead was secured in place with 2-0 silk.  The antibiotic-soaked sponge was removed from the pocket. The pocket was flushed with copious amounts of antibiotic solution. Reinspection showed excellent hemostasis..  The ventricular lead was connected to the generator and appropriate ventricular pacing was seen. Subsequently the atrial lead was also connected. Repeat testing of the lead parameters later showed excellent values.  The entire system was then carefully inserted in the pocket with care been taking that the leads and device assumed a comfortable position without pressure on the incision. Great care was taken that the leads be located deep to the generator. The pocket was then closed in layers using 2 layers of 2-0 Vicryl and cutaneous staples, after which a sterile dressing was applied.  At the end of the procedure the following lead parameters were encountered:  Right atrial lead  sensed P waves 3.9 mV, impedance 1029ohms, threshold 0.5 V at 0.5 ms pulse width.  Right ventricular lead sensed R waves 14.3 mV, impedance 1037ohms, threshold 0.7 V at 0.5 ms pulse width.

## 2011-07-15 NOTE — Progress Notes (Signed)
Patient ID: Casey Richard, male   DOB: 12-28-1955, 56 y.o.   MRN: [id number]  INFECTIOUS DISEASE PROGRESS NOTE    Date of Admission:  07/09/2011   Total days of antibiotics 5        Day 5 ceftriaxone        Day 4 gentamicin         Principal Problem:  *Cardiac arrest - ventricular fibrillation, 07/09/11 Active Problems:  Respiratory arrest, vent dependent  Complete heart block, requiring external pacing then temp wire pacing.  NSTEMI (non-ST elevated myocardial infarction)      .  ceFAZolin (ANCEF) IV  2 g Intravenous On Call  . cefTRIAXone (ROCEPHIN)  IV  2 g Intravenous Q24H  . chlorhexidine  60 mL Topical Once  . chlorhexidine  60 mL Topical Once  . gentamicin  220 mg Intravenous Q24H  . gentamicin irrigation  80 mg Irrigation On Call  . potassium chloride  10 mEq Intravenous Q1 Hr x 4  . sodium chloride  10 mL Intravenous BID  . sodium chloride  3 mL Intravenous Q12H  . DISCONTD: furosemide  20 mg Intravenous Daily  . DISCONTD: gentamicin  220 mg Intravenous Once  . DISCONTD: gentamicin  220 mg Intravenous Q24H  . DISCONTD: gentamicin  80 mg Intravenous Q8H    Subjective: He is feeling much better.  Objective: Temp:  [97.7 F (36.5 C)-98.5 F (36.9 C)] 98.5 F (36.9 C) (01/25 1116) Pulse Rate:  [61-66] 66  (01/25 1116) BP: (135)/(79) 135/79 mmHg (01/24 1300) SpO2:  [97 %-99 %] 97 % (01/25 1116) Weight:  [71.9 kg (158 lb 8.2 oz)] 71.9 kg (158 lb 8.2 oz) (01/25 0500)  General: He is in good spirits Skin: No rash Lungs: Clear Cor: Paced regular S1 and S2 no murmurs Abdomen: Nontender   Lab Results Lab Results  Component Value Date   WBC 11.3* 07/15/2011   HGB 13.6 07/15/2011   HCT 39.6 07/15/2011   MCV 88.8 07/15/2011   PLT 170 07/15/2011    Lab Results  Component Value Date   CREATININE 0.89 07/15/2011   BUN 26* 07/15/2011   NA 140 07/15/2011   K 4.1 07/15/2011   CL 106 07/15/2011   CO2 26 07/15/2011    Lab Results  Component Value Date   ALT 63*  07/12/2011   AST 36 07/12/2011   ALKPHOS 77 07/12/2011   BILITOT 0.6 07/12/2011       Microbiology: Recent Results (from the past 240 hour(s))  MRSA PCR SCREENING     Status: Normal   Collection Time   07/09/11  2:51 PM      Component Value Range Status Comment   MRSA by PCR NEGATIVE  NEGATIVE  Final   CULTURE, BLOOD (ROUTINE X 2)     Status: Normal   Collection Time   07/09/11  3:08 PM      Component Value Range Status Comment   Specimen Description BLOOD LEFT HAND   Final    Special Requests BOTTLES DRAWN AEROBIC ONLY 5CC   Final    Setup Time [id number]   Final    Culture     Final    Value: VIRIDANS STREPTOCOCCUS     Note: Gram Stain Report Called to,Read Back By and Verified With: RN D. SULLIVAN ON 07/10/11 AT 1557 BY TEDAR   Report Status 07/12/2011 FINAL   Final    Organism ID, Bacteria VIRIDANS STREPTOCOCCUS   Final   CULTURE, BLOOD (ROUTINE X 2)  Status: Normal   Collection Time   07/09/11  3:47 PM      Component Value Range Status Comment   Specimen Description BLOOD LEFT ARM   Final    Special Requests BOTTLES DRAWN AEROBIC ONLY 10CC   Final    Setup Time [id number]   Final    Culture     Final    Value: VIRIDANS STREPTOCOCCUS     Note: SUSCEPTIBILITIES PERFORMED ON PREVIOUS CULTURE WITHIN THE LAST 5 DAYS.     Note: Gram Stain Report Called to,Read Back By and Verified With: RN D. SULLIVAN ON 07/10/11 AT 1000 BY TEDAR   Report Status 07/12/2011 FINAL   Final   URINE CULTURE     Status: Normal   Collection Time   07/09/11 10:28 PM      Component Value Range Status Comment   Specimen Description URINE, RANDOM   Final    Special Requests NONE   Final    Setup Time [id number]   Final    Colony Count NO GROWTH   Final    Culture NO GROWTH   Final    Report Status 07/10/2011 FINAL   Final   CULTURE, RESPIRATORY     Status: Normal   Collection Time   07/10/11 12:48 PM      Component Value Range Status Comment   Specimen Description TRACHEAL ASPIRATE   Final     Special Requests NONE   Final    Gram Stain     Final    Value: ABUNDANT WBC PRESENT,BOTH PMN AND MONONUCLEAR     NO SQUAMOUS EPITHELIAL CELLS SEEN     ABUNDANT GRAM POSITIVE RODS     FEW GRAM NEGATIVE COCCOBACILLI   Culture Non-Pathogenic Oropharyngeal-type Flora Isolated.   Final    Report Status 07/12/2011 FINAL   Final   CULTURE, BLOOD (ROUTINE X 2)     Status: Normal (Preliminary result)   Collection Time   07/11/11 12:48 PM      Component Value Range Status Comment   Specimen Description BLOOD LEFT ARM   Final    Special Requests BOTTLES DRAWN AEROBIC AND ANAEROBIC 10CC   Final    Setup Time [id number]   Final    Culture     Final    Value:        BLOOD CULTURE RECEIVED NO GROWTH TO DATE CULTURE WILL BE HELD FOR 5 DAYS BEFORE ISSUING A FINAL NEGATIVE REPORT   Report Status PENDING   Incomplete   CULTURE, BLOOD (ROUTINE X 2)     Status: Normal (Preliminary result)   Collection Time   07/11/11 12:57 PM      Component Value Range Status Comment   Specimen Description BLOOD LEFT FOREARM   Final    Special Requests BOTTLES DRAWN AEROBIC ONLY 5CC   Final    Setup Time [id number]   Final    Culture     Final    Value:        BLOOD CULTURE RECEIVED NO GROWTH TO DATE CULTURE WILL BE HELD FOR 5 DAYS BEFORE ISSUING A FINAL NEGATIVE REPORT   Report Status PENDING   Incomplete     Studies/Results: Dg Chest Port 1 View  07/15/2011  *RADIOLOGY REPORT*  Clinical Data: Shortness of breath, evaluate for aspiration pneumonia  PORTABLE CHEST - 1 VIEW  Comparison: 07/12/2011; 07/11/2011; chest CT - 07/12/2011  Findings: Grossly unchanged cardiac silhouette and mediastinal contours. Interval removal of left jugular approach central venous  catheter, otherwise, stable positioning of support apparatus. Improved aeration of the bilateral lung bases.  No new focal airspace opacities.  There is minimal blunting of right costophrenic angle suggestive of a small right-sided pleural effusion.  No definite  left-sided pleural effusion.  No pneumothorax. No acute osseous abnormalities.  IMPRESSION:  Improved aeration of the bilateral lung bases with decreased bibasilar opacities suggestive of resolving atelectasis versus infiltrate.  Original Report Authenticated By: Waynard Reeds, M.D.     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 He is tolerating his antibiotic therapy well. He needs at least 9 more days of IV antibiotic therapy.  Plan: 1. Continue current antibiotics 2. Please call my partner, Dr. Judyann Munson [phone]), for any infectious disease questions this weekend   Cliffton Asters, MD [facility] [phone] pager   [phone] cell 07/15/2011, 12:10 PM

## 2011-07-15 NOTE — Progress Notes (Signed)
[facility]  Subjective: Feels remarkably well. No complaints.  Objective: Vital signs in last 24 hours: Temp:  [97.7 F (36.5 C)-98.5 F (36.9 C)] 98.1 F (36.7 C) (01/25 0727) Pulse Rate:  [61-65] 62  (01/25 0600) BP: (115-135)/(75-79) 135/79 mmHg (01/24 1300) SpO2:  [97 %-99 %] 97 % (01/25 0600) Weight:  [71.9 kg (158 lb 8.2 oz)] 71.9 kg (158 lb 8.2 oz) (01/25 0500) Last BM Date:  (PTA)  Intake/Output from previous day: 01/24 0701 - 01/25 0700 In: 1975 [P.O.:1160; I.V.:460; IV Piggyback:355] Out: 2250 [Urine:2250] Intake/Output this shift:    Medications Current Facility-Administered Medications  Medication Dose Route Frequency Provider Last Rate Last Dose  . 0.45 % sodium chloride infusion   Intravenous Continuous Ileigh Mettler, MD 50 mL/hr at 07/15/11 0614 50 mL/hr at 07/15/11 0614  . 0.9 %  sodium chloride infusion   Intravenous Continuous Corky Crafts, MD 20 mL/hr at 07/13/11 1530 20 mL/hr at 07/13/11 1530  . 0.9 %  sodium chloride infusion   Intravenous Continuous PRN Chrystie Nose, MD 20 mL/hr at 07/10/11 0900    . 0.9 %  sodium chloride infusion   Intravenous Continuous Kardell Virgil, MD 50 mL/hr at 07/15/11 0613 50 mL/hr at 07/15/11 0613  . acetaminophen (TYLENOL) tablet 650 mg  650 mg Oral Q4H PRN Corky Crafts, MD   650 mg at 07/15/11 [phone]  . ceFAZolin (ANCEF) IVPB 2 g/50 mL premix  2 g Intravenous On Call Latonya Knight, MD      . cefTRIAXone (ROCEPHIN) 2 g in dextrose 5 % 50 mL IVPB  2 g Intravenous Q24H Dannielle Huh, PHARMD   2 g at 07/14/11 1402  . chlorhexidine (HIBICLENS) 4 % liquid 4 application  60 mL Topical Once Thurmon Fair, MD   4 application at 07/14/11 2258  . chlorhexidine (HIBICLENS) 4 % liquid 4 application  60 mL Topical Once Thurmon Fair, MD   4 application at 07/15/11 0614  . furosemide (LASIX) injection 20 mg  20 mg Intravenous Daily Mcarthur Rossetti. Tyson Alias, MD   20 mg at 07/14/11 0936    . gentamicin (GARAMYCIN) 220 mg in dextrose 5 % 100 mL IVPB  220 mg Intravenous Q24H Dannielle Karvonen [place], PHARMD   220 mg at 07/14/11 2358  . gentamicin (GARAMYCIN) 80 mg in sodium chloride irrigation 0.9 % 500 mL irrigation  80 mg Irrigation On Call Thurmon Fair, MD      . labetalol (NORMODYNE,TRANDATE) injection 10 mg  10 mg Intravenous Q10 min PRN Nolon Lennert, MD      . ondansetron [facility]) injection 4 mg  4 mg Intravenous Q6H PRN Corky Crafts, MD      . potassium chloride 10 mEq in 50 mL *CENTRAL LINE* IVPB  10 mEq Intravenous Q1 Hr x 4 Deatra Robinson, MD   10 mEq at 07/14/11 1439  . sodium chloride 0.9 % injection 10 mL  10 mL Intravenous BID Coralyn Helling, MD   10 mL at 07/14/11 2255  . sodium chloride 0.9 % injection 3 mL  3 mL Intravenous Q12H Nolon Lennert, MD   3 mL at 07/14/11 2255  . DISCONTD: gentamicin (GARAMYCIN) 220 mg in dextrose 5 % 100 mL IVPB  220 mg Intravenous Once Cleon Dew, PHARMD      . DISCONTD: gentamicin (GARAMYCIN) 220 mg in dextrose 5 % 100 mL IVPB  220 mg Intravenous Q24H Dannielle Karvonen [place], PHARMD      .  DISCONTD: gentamicin (GARAMYCIN) IVPB 80 mg  80 mg Intravenous Q8H Daniel J. Tyson Alias, MD   80 mg at 07/14/11 1320    General appearance: alert, cooperative and no distress Neck: no adenopathy, no carotid bruit, no JVD, supple, symmetrical, trachea midline and thyroid not enlarged, symmetric, no tenderness/mass/nodules Lungs: clear to auscultation bilaterally Heart: normal apical impulse, regular rate and rhythm, S1: normal, S2: paradoxically splitting, no click and no rub Abdomen: soft, non-tender; bowel sounds normal; no masses,  no organomegaly Extremities: extremities normal, atraumatic, no cyanosis or edema and TPW site healthy Pulses: 2+ and symmetric Skin: Skin color, texture, turgor normal. No rashes or lesions Neurologic: Alert and oriented X 3, normal strength and tone. Normal symmetric reflexes. Normal coordination and  gait   Lab Results:   Basename 07/15/11 0444 07/14/11 0400 07/13/11 0351  WBC 11.3* 10.9* 10.9*  HGB 13.6 12.9* 12.5*  HCT 39.6 37.7* 37.6*  PLT 170 157 127*   BMET  Basename 07/15/11 0444 07/14/11 0400 07/13/11 0351  NA 140 142 140  K 4.1 3.6 4.1  CL 106 108 106  CO2 26 28 28   GLUCOSE 106* 118* 124*  BUN 26* 18 15  CREATININE 0.89 0.77 0.77  CALCIUM 8.3* 8.0* 8.1*   TEE, 07/13/11 Study Conclusions  - Left ventricle: Systolic function was normal. Wall motion was normal; there were no regional wall motion abnormalities. - Aortic valve: No evidence of vegetation. - Mitral valve: No evidence of vegetation. - Left atrium: No evidence of thrombus in the atrial cavity or appendage. - Right atrium: No evidence of thrombus in the atrial cavity or appendage. - Atrial septum: There was a valve-incompetent patent foramen ovale. There was redundancy of the septum, with borderline criteria for aneurysm. - Tricuspid valve: No evidence of vegetation. - Pulmonic valve: No evidence of vegetation.   PORTABLE CHEST - 1 VIEW  Comparison: 07/12/2011; 07/11/2011; chest CT - 07/12/2011  Findings: Grossly unchanged cardiac silhouette and mediastinal  contours. Interval removal of left jugular approach central venous  catheter, otherwise, stable positioning of support apparatus.  Improved aeration of the bilateral lung bases. No new focal  airspace opacities. There is minimal blunting of right  costophrenic angle suggestive of a small right-sided pleural  effusion. No definite left-sided pleural effusion. No  pneumothorax. No acute osseous abnormalities.  IMPRESSION:  Improved aeration of the bilateral lung bases with decreased  bibasilar opacities suggestive of resolving atelectasis versus  infiltrate.  Assessment/Plan   Principal Problem:  *Cardiac arrest - ventricular fibrillation, 07/09/11 Active Problems:  Respiratory arrest, vent dependent  Complete heart block, requiring  external pacing then temp wire pacing.  NSTEMI (non-ST elevated myocardial infarction)  Plan:  Blood cultures negative to date.  For Pacemaker today.  WBCs slightly elevated but stable.  S/P TEE yesterday  No vegetations/thrombus , normal wall motion.   LOS: 6 days    HAGER,BRYAN W 07/15/2011 8:05 AM  I have seen and examined the patient along with Dwana Melena, PA.  I have reviewed the chart, notes and new data.  I agree with PA's note.  Key new complaints: none Key examination changes: remains in complete heart block, without an escape rhythm when rate is decreased to 30 bpm on TPM Key new findings / data: 0121 blood cultures remain negative  PLAN: Permanent dual chamber pacemaker today. The risks and benefits were reviewed in detail and he agrees to proceed.  Thurmon Fair, MD, [facility] [phone] 07/15/2011, 8:27 AM
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
## 2011-07-16 ENCOUNTER — Inpatient Hospital Stay (HOSPITAL_COMMUNITY): Payer: Medicaid Other

## 2011-07-16 DIAGNOSIS — A491 Streptococcal infection, unspecified site: Secondary | ICD-10-CM

## 2011-07-16 DIAGNOSIS — R7881 Bacteremia: Secondary | ICD-10-CM

## 2011-07-16 MED ORDER — ZOLPIDEM TARTRATE 5 MG PO TABS
10.0000 mg | ORAL_TABLET | Freq: Every evening | ORAL | Status: DC | PRN
  Filled 2011-07-16: qty 2

## 2011-07-16 NOTE — Progress Notes (Signed)
Subjective:  No CP/SOB. S/P PTVPM implantation  Objective:  Temp:  [98 F (36.7 C)-98.7 F (37.1 C)] 98.2 F (36.8 C) (01/26 0719) Pulse Rate:  [62-88] 86  (01/26 0600) Resp:  [16-20] 18  (01/26 0719) BP: (115-148)/(63-84) 131/83 mmHg (01/26 0719) SpO2:  [97 %-100 %] 98 % (01/26 0600) Weight change:   Intake/Output from previous day: 01/25 0701 - 01/26 0700 In: 860 [P.O.:180; I.V.:420; IV Piggyback:260] Out: 1725 [Urine:1725]  Intake/Output from this shift:    Physical Exam: General appearance: alert and cooperative Neck: no adenopathy, no carotid bruit, no JVD, supple, symmetrical, trachea midline and thyroid not enlarged, symmetric, no tenderness/mass/nodules Lungs: clear to auscultation bilaterally Heart: regular rate and rhythm, S1, S2 normal, no murmur, click, rub or gallop Extremities: extremities normal, atraumatic, no cyanosis or edema Chest wall: PTVPM site bandaged  Lab Results: Results for orders placed during the hospital encounter of 07/09/11 (from the past 48 hour(s))  GLUCOSE, CAPILLARY     Status: Normal   Collection Time   07/14/11  8:26 AM      Component Value Range Comment   Glucose-Capillary 96  70 - 99 (mg/dL)   SEDIMENTATION RATE     Status: Abnormal   Collection Time   07/14/11 11:00 AM      Component Value Range Comment   Sed Rate 30 (*) 0 - 16 (mm/hr)   C3 COMPLEMENT     Status: Normal   Collection Time   07/14/11 11:00 AM      Component Value Range Comment   C3 Complement 122  90 - 180 (mg/dL)   C4 COMPLEMENT     Status: Abnormal   Collection Time   07/14/11 11:00 AM      Component Value Range Comment   Complement C4, Body Fluid 41 (*) 10 - 40 (mg/dL)   ANTI-DNA ANTIBODY, DOUBLE-STRANDED     Status: Normal   Collection Time   07/14/11 11:00 AM      Component Value Range Comment   ds DNA Ab 1  <30 (IU/mL)   GLUCOSE, CAPILLARY     Status: Normal   Collection Time   07/14/11 12:56 PM      Component Value Range Comment   Glucose-Capillary  89  70 - 99 (mg/dL)   GENTAMICIN LEVEL, PEAK     Status: Abnormal   Collection Time   07/14/11  2:30 PM      Component Value Range Comment   Gentamicin Pk 2.5 (*) 5.0 - 10.0 (ug/mL)   GLUCOSE, CAPILLARY     Status: Abnormal   Collection Time   07/14/11  4:29 PM      Component Value Range Comment   Glucose-Capillary 110 (*) 70 - 99 (mg/dL)   CBC     Status: Abnormal   Collection Time   07/15/11  4:44 AM      Component Value Range Comment   WBC 11.3 (*) 4.0 - 10.5 (K/uL)    RBC 4.46  4.22 - 5.81 (MIL/uL)    Hemoglobin 13.6  13.0 - 17.0 (g/dL)    HCT 14.7  82.9 - 56.2 (%)    MCV 88.8  78.0 - 100.0 (fL)    MCH 30.5  26.0 - 34.0 (pg)    MCHC 34.3  30.0 - 36.0 (g/dL)    RDW 13.0  86.5 - 78.4 (%)    Platelets 170  150 - 400 (K/uL)   BASIC METABOLIC PANEL     Status: Abnormal   Collection Time  07/15/11  4:44 AM      Component Value Range Comment   Sodium 140  135 - 145 (mEq/L)    Potassium 4.1  3.5 - 5.1 (mEq/L)    Chloride 106  96 - 112 (mEq/L)    CO2 26  19 - 32 (mEq/L)    Glucose, Bld 106 (*) 70 - 99 (mg/dL)    BUN 26 (*) 6 - 23 (mg/dL)    Creatinine, Ser 1.61  0.50 - 1.35 (mg/dL)    Calcium 8.3 (*) 8.4 - 10.5 (mg/dL)    GFR calc non Af Amer >90  >90 (mL/min)    GFR calc Af Amer >90  >90 (mL/min)   MAGNESIUM     Status: Normal   Collection Time   07/15/11  4:44 AM      Component Value Range Comment   Magnesium 2.0  1.5 - 2.5 (mg/dL)   GLUCOSE, CAPILLARY     Status: Abnormal   Collection Time   07/15/11  7:29 AM      Component Value Range Comment   Glucose-Capillary 108 (*) 70 - 99 (mg/dL)   GLUCOSE, CAPILLARY     Status: Normal   Collection Time   07/15/11 11:19 AM      Component Value Range Comment   Glucose-Capillary 90  70 - 99 (mg/dL)     Imaging: Imaging results have been reviewed. Post procedure CXR pending to be done  Assessment/Plan:   1. Principal Problem: 2.  *Cardiac arrest - ventricular fibrillation, 07/09/11 3. Active Problems: 4.  Respiratory arrest,  vent dependent 5.  Complete heart block, requiring external pacing then temp wire pacing. 6.  NSTEMI (non-ST elevated myocardial infarction) 7.   Time Spent Directly with Patient:  30 minutes  Length of Stay:  LOS: 7 days   S/P dual chamber PTVPM implanted yesterday by Dr. Rubie Maid  for CHB.  + blood cultures on IV ATBX per ID. Last neg blood culture 1/21 with recommendation for 2 additional weeks of IV ATBX with cefazolin and gent. TEE negative for SBE and pt is afebrile with WBC of 11.3 yesterday. Pt is pacing appropriately . OK to transfer to tele. [facility]. Will probably require a picc line for ATBX at home by [facility].    Runell Gess 07/16/2011, 7:50 AM

## 2011-07-16 NOTE — Progress Notes (Addendum)
INFECTIOUS DISEASE PROGRESS NOTE  ID: Casey Richard is a 56 y.o. male with  Vfib arrest, c/b complete heart block POD#1 for pacemaker in setting of also having viridans streptococcus bacteremia on 1/19, treated on ceftriaxone and gentamicin  Subjective: Afebrile, tolerated pacemaker procedure without difficulty. Patient reports mild headache overnight, resolved with tylenol. He states he is still having memory difficulties from his heart attack.  Abtx: #6 Ceftriaxone d5 Cefazolin d1 Gentamicin d4  Medications:     .  ceFAZolin (ANCEF) IV  1 g Intravenous Q6H  . cefTRIAXone (ROCEPHIN)  IV  2 g Intravenous Q24H  . fentaNYL      . gentamicin  220 mg Intravenous Q24H  . heparin      . lidocaine      . midazolam      . DISCONTD:  ceFAZolin (ANCEF) IV  2 g Intravenous On Call  . DISCONTD: cefTRIAXone (ROCEPHIN)  IV  2 g Intravenous Q24H  . DISCONTD: gentamicin  220 mg Intravenous Q24H  . DISCONTD: gentamicin irrigation  80 mg Irrigation On Call  . DISCONTD: gentamicin irrigation  80 mg Irrigation To Cath  . DISCONTD: sodium chloride  10 mL Intravenous BID  . DISCONTD: sodium chloride  3 mL Intravenous Q12H    Objective: Vital signs in last 24 hours: Temp:  [98 F (36.7 C)-98.7 F (37.1 C)] 98.2 F (36.8 C) (01/26 0719) Pulse Rate:  [66-88] 86  (01/26 0600) Resp:  [16-20] 18  (01/26 0719) BP: (115-148)/(63-84) 131/83 mmHg (01/26 0719) SpO2:  [97 %-100 %] 98 % (01/26 0600)  Gen: awake and alert in NAD HEENT: PERRLA, EOMI, no scleral icterus PULM: CTAB, no w/c/r CARD: nl S1,S2, no g/m/r ABD: BS+ , NTND EXT: no C/C/E  Lab Results  Basename 07/15/11 0444 07/14/11 0400  WBC 11.3* 10.9*  HGB 13.6 12.9*  HCT 39.6 37.7*  NA 140 142  K 4.1 3.6  CL 106 108  CO2 26 28  BUN 26* 18  CREATININE 0.89 0.77  GLU -- --    Microbiology: Recent Results (from the past 240 hour(s))  MRSA PCR SCREENING     Status: Normal   Collection Time   07/09/11  2:51 PM      Component  Value Range Status Comment   MRSA by PCR NEGATIVE  NEGATIVE  Final   CULTURE, BLOOD (ROUTINE X 2)     Status: Normal   Collection Time   07/09/11  3:08 PM      Component Value Range Status Comment   Specimen Description BLOOD LEFT HAND   Final    Special Requests BOTTLES DRAWN AEROBIC ONLY 5CC   Final    Setup Time [id number]   Final    Culture     Final    Value: VIRIDANS STREPTOCOCCUS     Note: Gram Stain Report Called to,Read Back By and Verified With: RN D. SULLIVAN ON 07/10/11 AT 1557 BY TEDAR   Report Status 07/12/2011 FINAL   Final    Organism ID, Bacteria VIRIDANS STREPTOCOCCUS   Final   CULTURE, BLOOD (ROUTINE X 2)     Status: Normal   Collection Time   07/09/11  3:47 PM      Component Value Range Status Comment   Specimen Description BLOOD LEFT ARM   Final    Special Requests BOTTLES DRAWN AEROBIC ONLY 10CC   Final    Setup Time [id number]   Final    Culture     Final  Value: VIRIDANS STREPTOCOCCUS     Note: SUSCEPTIBILITIES PERFORMED ON PREVIOUS CULTURE WITHIN THE LAST 5 DAYS.     Note: Gram Stain Report Called to,Read Back By and Verified With: RN D. SULLIVAN ON 07/10/11 AT 1000 BY TEDAR   Report Status 07/12/2011 FINAL   Final   URINE CULTURE     Status: Normal   Collection Time   07/09/11 10:28 PM      Component Value Range Status Comment   Specimen Description URINE, RANDOM   Final    Special Requests NONE   Final    Setup Time [id number]   Final    Colony Count NO GROWTH   Final    Culture NO GROWTH   Final    Report Status 07/10/2011 FINAL   Final   CULTURE, RESPIRATORY     Status: Normal   Collection Time   07/10/11 12:48 PM      Component Value Range Status Comment   Specimen Description TRACHEAL ASPIRATE   Final    Special Requests NONE   Final    Gram Stain     Final    Value: ABUNDANT WBC PRESENT,BOTH PMN AND MONONUCLEAR     NO SQUAMOUS EPITHELIAL CELLS SEEN     ABUNDANT GRAM POSITIVE RODS     FEW GRAM NEGATIVE COCCOBACILLI   Culture  Non-Pathogenic Oropharyngeal-type Flora Isolated.   Final    Report Status 07/12/2011 FINAL   Final   CULTURE, BLOOD (ROUTINE X 2)     Status: Normal (Preliminary result)   Collection Time   07/11/11 12:48 PM      Component Value Range Status Comment   Specimen Description BLOOD LEFT ARM   Final    Special Requests BOTTLES DRAWN AEROBIC AND ANAEROBIC 10CC   Final    Setup Time [id number]   Final    Culture     Final    Value:        BLOOD CULTURE RECEIVED NO GROWTH TO DATE CULTURE WILL BE HELD FOR 5 DAYS BEFORE ISSUING A FINAL NEGATIVE REPORT   Report Status PENDING   Incomplete   CULTURE, BLOOD (ROUTINE X 2)     Status: Normal (Preliminary result)   Collection Time   07/11/11 12:57 PM      Component Value Range Status Comment   Specimen Description BLOOD LEFT FOREARM   Final    Special Requests BOTTLES DRAWN AEROBIC ONLY 5CC   Final    Setup Time [id number]   Final    Culture     Final    Value:        BLOOD CULTURE RECEIVED NO GROWTH TO DATE CULTURE WILL BE HELD FOR 5 DAYS BEFORE ISSUING A FINAL NEGATIVE REPORT   Report Status PENDING   Incomplete     Studies/Results: 1/23 TEE Study Conclusions  - Left ventricle: Systolic function was normal. Wall motion was normal; there were no regional wall motion abnormalities. - Aortic valve: No evidence of vegetation. - Mitral valve: No evidence of vegetation. - Left atrium: No evidence of thrombus in the atrial cavity or appendage. - Right atrium: No evidence of thrombus in the atrial cavity or appendage. - Atrial septum: There was a valve-incompetent patent foramen ovale. There was redundancy of the septum, with borderline criteria for aneurysm. - Tricuspid valve: No evidence of vegetation. - Pulmonic valve: No evidence of vegetation.   Dg Chest 2 View  07/16/2011  *RADIOLOGY REPORT*  Clinical Data: Shortness of breath.  Pacemaker  placement.  CHEST - 2 VIEW  Comparison: 07/15/2011  Findings: A right-sided dual lead pacer is  noted with proximal and distal leads projecting over the right atrium and right ventricle, respectively.  No pneumothorax is observed. Cardiac and mediastinal contours appear normal.  The lungs appear clear.  No pleural effusion is identified.  IMPRESSION:  1.  Right pacemaker placement, with lead tips in satisfactory position. 2.  No pneumothorax or complicating feature.  Original Report Authenticated By: Dellia Cloud, M.D.   Dg Chest Port 1 View  07/15/2011  *RADIOLOGY REPORT*  Clinical Data: Shortness of breath, evaluate for aspiration pneumonia  PORTABLE CHEST - 1 VIEW  Comparison: 07/12/2011; 07/11/2011; chest CT - 07/12/2011  Findings: Grossly unchanged cardiac silhouette and mediastinal contours. Interval removal of left jugular approach central venous catheter, otherwise, stable positioning of support apparatus. Improved aeration of the bilateral lung bases.  No new focal airspace opacities.  There is minimal blunting of right costophrenic angle suggestive of a small right-sided pleural effusion.  No definite left-sided pleural effusion.  No pneumothorax. No acute osseous abnormalities.  IMPRESSION:  Improved aeration of the bilateral lung bases with decreased bibasilar opacities suggestive of resolving atelectasis versus infiltrate.  Original Report Authenticated By: Waynard Reeds, M.D.   Assessment/Plan: Viridans streptococcus bacteremia in setting of VFIB arrest = patient did not have sustained bacteremia based on repeat blood cultures, nor any evidence of vegetation on TEE; we will recommend a total of 14 days of antibiotic therapy for bacteremia. Viridans streptococcus isolate found on 2 sets of blood cultures on 1/19 was sensitive to penicillin < 0.125. He  is currently on day 6 of therapy.  - he will need picc line placement  - he should be continued on ceftriaxone 2gm IV daily. No longer needs gentamicin - his antibiotics end date will be February 4th.  Duke Salvia Drue Second MD  [facility] [phone]  07/16/2011, 9:45 AM
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
## 2011-07-17 LAB — CULTURE, BLOOD (ROUTINE X 2)
Culture  Setup Time: 201301212143
Culture  Setup Time: 201301212143
Culture: NO GROWTH
Culture: NO GROWTH

## 2011-07-17 LAB — MPO/PR-3 (ANCA) ANTIBODIES

## 2011-07-17 NOTE — Progress Notes (Signed)
Subjective:  No CP/SOB  Objective:  Temp:  [97 F (36.1 C)-98.7 F (37.1 C)] 97.7 F (36.5 C) (01/27 0500) Pulse Rate:  [83-87] 87  (01/27 0500) Resp:  [18-20] 19  (01/27 0500) BP: (126-147)/(64-80) 126/80 mmHg (01/27 0500) SpO2:  [95 %-98 %] 95 % (01/27 0500) Weight change:   Intake/Output from previous day: 01/26 0701 - 01/27 0700 In: 635.5 [P.O.:480; IV Piggyback:155.5] Out: 426 [Urine:425; Stool:1]  Intake/Output from this shift: Total I/O In: 240 [P.O.:240] Out: -   Physical Exam: General appearance: alert and cooperative Neck: no adenopathy, no carotid bruit, no JVD, supple, symmetrical, trachea midline and thyroid not enlarged, symmetric, no tenderness/mass/nodules Lungs: clear to auscultation bilaterally Heart: regular rate and rhythm, S1, S2 normal, no murmur, click, rub or gallop Extremities: extremities normal, atraumatic, no cyanosis or edema  Lab Results: Results for orders placed during the hospital encounter of 07/09/11 (from the past 48 hour(s))  GLUCOSE, CAPILLARY     Status: Normal   Collection Time   07/15/11 11:19 AM      Component Value Range Comment   Glucose-Capillary 90  70 - 99 (mg/dL)     Imaging: Imaging results have been reviewed  Assessment/Plan:   1. Principal Problem: 2.  *Cardiac arrest - ventricular fibrillation, 07/09/11 3. Active Problems: 4.  Respiratory arrest, vent dependent 5.  Complete heart block, requiring external pacing then temp wire pacing. 6.  NSTEMI (non-ST elevated myocardial infarction) 7.   Time Spent Directly with Patient:  20 minutes  Length of Stay:  LOS: 8 days   Pt looks much better today. S/P PTVPM Friday seconday to [facility]. Pt had 2 + BC of Strep bacteremia. Per ID needs 9 more days of OP ATBX therapy probably given by [facility]. Needs PICC line placed. Also has SP foley catheter inserted by Urology which apparently he will go home with and F/U with Urology. Exam benign. Pacing on Tele. Afeb, labs OK except  for mild increase WBC  Alasia Enge J 07/17/2011, 9:05 AM

## 2011-07-17 NOTE — Progress Notes (Signed)
Gentamicin Protocol  Viridan strep bacteremia with neg TEE.  Dr. Drue Second note stated gent no longer indicated since he doesn't have endocarditis.  Plan:  Dc gent Rx to sign off

## 2011-07-18 MED ORDER — YOU HAVE A PACEMAKER BOOK
Freq: Once | Status: DC
  Filled 2011-07-18: qty 1

## 2011-07-18 MED ORDER — ACETAMINOPHEN 325 MG PO TABS: 325.0000 mg | ORAL_TABLET | ORAL | Status: DC | PRN

## 2011-07-18 MED ORDER — DEXTROSE 5 % IV SOLN: 2.0000 g | INTRAVENOUS | Status: DC

## 2011-07-18 MED ORDER — HEPARIN SOD (PORK) LOCK FLUSH 100 UNIT/ML IV SOLN
250.0000 [IU] | INTRAVENOUS | Status: DC | PRN
  Administered 2011-07-18: 250 [IU]
  Filled 2011-07-18: qty 3

## 2011-07-18 MED ORDER — HEPARIN SOD (PORK) LOCK FLUSH 100 UNIT/ML IV SOLN
250.0000 [IU] | Freq: Every day | INTRAVENOUS | Status: DC
Start: 2011-07-18 — End: 2011-07-18
  Filled 2011-07-18: qty 3

## 2011-07-18 MED ORDER — SODIUM CHLORIDE 0.9 % IJ SOLN
10.0000 mL | INTRAMUSCULAR | Status: DC | PRN
  Administered 2011-07-18 (×2): 10 mL

## 2011-07-18 NOTE — Discharge Summary (Signed)
Physician Discharge Summary  Patient ID: Casey Richard MRN: [id number] DOB/AGE: Nov 02, 1955 56 y.o.  Admit date: 07/09/2011 Discharge date: 07/18/2011  Admission Diagnoses: cardiac arrest  Discharge Diagnoses:  Principal Problem:  *Cardiac arrest - ventricular fibrillation, 07/09/11 Active Problems:  Respiratory arrest, vent dependent  Complete heart block, requiring external pacing then temp wire pacing.  NSTEMI (non-ST elevated myocardial infarction)   Discharged Condition: stable  Hospital Course:  Witnessed arrest in 56 yo with no past medical history.  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 EMS arrived and intubation in field was performed. Transported to [facility] ED with recurrent VFIB tx with Amiodarone 150 mg bolus. Developed complete heart block and was tx to Cath Lab for pacing wire and cath.   His wife stated he had the flu 1 month ago and has not felt well since. He has been short of breath at times. Last week his heart rate was in the 40's and he did not feel well, she thought he was having a heart attack then. He has been using a friend's inhaler, unsure name of inhaler. But he did feel better after use. + family hx. Of CAD.   Patient was initiated on arctic sun pads in the emergency room. He was taken for left heart catheterization and insertion of temporary pacemaker.  With her catheterization showed no significant coronary disease normal left ventricular systolic function and LVEDP of 26 mm mercury ejection fraction 55%.Patient had a difficult Foley catheter insertion and resulting false passage. Suprapubic tube was placed into the bladder.  The patient will require outpatient follow up with Dr. Crecencio Mc. His blood cultures were positive for gram-positive cocci.  He was started on on vancomycin and Rocephin. Patient was extubated on January 21.  Patient was taken  for a TEE on January 23rd and no evidence of vegetations or perivalvular abscess noted.  Patient scheduled for insertion of the permanent pacemaker 25th.  This was completed without complications or pneumothorax.  Patient continued to improve and leukocytosis continue to resolve.  Patient is currently in stable condition he has been seen by Dr. Tresa Endo will be discharged home with 6 more days of IV ceftriaxone which home health will administer. He also followup with urology as an outpatient her management of urethral trauma and a suprapubic catheter. Followup at [facility] with Dr. Royann Shivers approximately 5-7 days.  See below for completed the imaging and studies.  Consults: Infectious disease  Significant Diagnostic Studies:  07/15/11,   Implantation of new dual chamber permanent pacemaker  Procedure performed:  1. Implantation of new dual chamber permanent pacemaker  2. Fluoroscopy  3. Light sedation  Reason for procedure:  Syncope due to Complete heart block  Procedure performed by:  Thurmon Fair, MD  Complications:  None  Estimated blood loss:  <10 mL  Medications administered during procedure:  Ancef 1 g intravenously  Lidocaine 1% 30 mL locally,  Fentanyl 25 mcg intravenously  Versed 2 mg intravenously  Device details:  Generator Medtronic Revo model RV DRO1 serial number [id number]  Right atrial lead Medtronic 5086-45 cm serial number [id number]  Right ventricular lead Medtronic 5086-52 cm serial number [id number]  Procedure details:  After the risks and benefits of the procedure were discussed the patient provided informed consent and was brought to the cardiac cath lab in the fasting state. The patient  was prepped and draped in usual sterile fashion. Local anesthesia with 1% lidocaine was administered to to the right infraclavicular area. A 5-6 cm horizontal incision was made parallel with and 2-3 cm caudal to the left clavicle. Using electrocautery and  blunt dissection a prepectoral pocket was created down to the level of the pectoralis major muscle fascia. The pocket was carefully inspected for hemostasis. An antibiotic-soaked sponge was placed in the pocket.  Under fluoroscopic guidance and using the modified Seldinger technique 2 separate venipunctures were performed to access the left subclavian vein. no difficulty was encountered accessing the vein. Two J-tip guidewires were subsequently exchanged for 8 [place] safe sheaths.  Under fluoroscopic guidance the ventricular lead was advanced to level of the mid to apical right ventricular septum and thet active-fixation helix was deployed. Prominent current of injury was seen. Satisfactory pacing and sensing parameters were recorded. There was no evidence of diaphragmatic stimulation at maximum device output. The safe sheath was peeled away and the lead was secured in place with 2-0 silk.  In similar fashion the right atrial lead was advanced to the level of the atrial appendage. The active-fixation helix was deployed. There was prominent current of injury. Satisfactory pacing and sensing parameters were recorded. There was no evidence of diaphragmatic stimulation with pacing at maximum device output. The safe sheath was peeled away and the lead was secured in place with 2-0 silk.  The antibiotic-soaked sponge was removed from the pocket. The pocket was flushed with copious amounts of antibiotic solution. Reinspection showed excellent hemostasis..  The ventricular lead was connected to the generator and appropriate ventricular pacing was seen. Subsequently the atrial lead was also connected. Repeat testing of the lead parameters later showed excellent values.  The entire system was then carefully inserted in the pocket with care been taking that the leads and device assumed a comfortable position without pressure on the incision. Great care was taken that the leads be located deep to the generator. The  pocket was then closed in layers using 2 layers of 2-0 Vicryl and cutaneous staples, after which a sterile dressing was applied.  At the end of the procedure the following lead parameters were encountered:  Right atrial lead  sensed P waves 3.9 mV, impedance 1029ohms, threshold 0.5 V at 0.5 ms pulse width.  Right ventricular lead sensed R waves 14.3 mV, impedance 1037ohms, threshold 0.7 V at 0.5 ms pulse width.  07/13/11 TEE PROCEDURE:  Informed consent was obtained prior to the procedure. The risks, benefits and alternatives for the procedure were discussed and the patient comprehended these risks. Risks include, but are not limited to, cough, sore throat, vomiting, nausea, somnolence, esophageal and stomach trauma or perforation, bleeding, low blood pressure, aspiration, pneumonia, infection, trauma to the teeth and death.  After a procedural time-out, the oropharynx was anesthetized with 20% benzocaine spray. The patient was given 4 mg versed and 50 mcg fentanyl for moderate sedation. The transesophageal probe was inserted in the esophagus and stomach without difficulty and multiple views were obtained. The patient was kept under observation until the patient left the procedure room. The patient left the procedure room in stable condition.  Agitated microbubble saline contrast was not administered.  COMPLICATIONS:  There were no immediate complications.  FINDINGS:  Normal study. No evidence of vegetations or perivalvular abscess.  RECOMMENDATIONS:  Permanent pacemaker on Friday if blood cultures from 01/21 remain negative.  07/16/11, PORTABLE CHEST - 1 VIEW  Comparison: Chest radiograph 07/16/2011  Findings: Interval  placement of a left-sided PICC line with tip in  the distal SVC. No pneumothorax. No pulmonary edema. Right-sided  pacemaker noted.  IMPRESSION:  Interval placement left PICC line in good position.  07/12/11: CT MAXILLOFACIAL WITHOUT CONTRAST  Technique: Multidetector CT  imaging of the maxillofacial  structures was performed. Multiplanar CT image reconstructions were  also generated.  Comparison: None.  Findings: Air fluid levels are present in the bilateral maxillary  sinuses, and bilateral sphenoid sinuses. Mucosal thickening  throughout the paranasal sinuses is present. There is no bony  destruction. Mastoid air cells are clear. Ostiomeatal complexes  are clear.  No obvious evidence of mass effect within the soft tissue spaces of  the head or neck. Airway is patent. Epiglottis is unremarkable.  IMPRESSION:  Inflammatory changes throughout the paranasal sinuses as described.  Air-fluid levels are present in the bilateral maxillary and  sphenoid sinuses  07/12/11, CT CHEST WITH CONTRAST  Technique: Multidetector CT imaging of the chest was performed  following the standard protocol during bolus administration of  intravenous contrast.  Contrast: 80mL OMNIPAQUE IOHEXOL 300 MG/ML IV SOLN  Comparison: None.  Findings: No evidence of abnormal mediastinal adenopathy.  Pacemaker lead extending from the IVC is present with its tip at  the apex of the right ventricle.  Left internal jugular vein central venous catheter tip is in the  left innominate vein.  Small bilateral pleural effusions right greater than left.  Bibasilar dependent consolidation primarily in the lower lobes  bilaterally. No peripheral or peribronchial nodularity to suggest  interstitial sarcoid lung disease.  IMPRESSION:  Bilateral pleural effusions and bibasilar consolidation. The  pattern is suspicious for aspiration.  No evidence of mediastinal adenopathy.  07/09/11, CT HEAD WITHOUT CONTRAST  CT CERVICAL SPINE WITHOUT CONTRAST  Technique: Multidetector CT imaging of the head and cervical spine  was performed following the standard protocol without intravenous  contrast. Multiplanar CT image reconstructions of the cervical  spine were also generated.  Comparison: None  CT HEAD    Findings: Motion degraded images.  No evidence of parenchymal hemorrhage or extra-axial fluid  collection. No mass lesion, mass effect, or midline shift.  No CT evidence of acute infarction.  Cerebral volume is age appropriate. No ventriculomegaly.  Partial opacification of the left greater than right maxillary and  ethmoid sinuses. The mastoid air cells are clear.  Extracranial hematoma overlying the right parietal scalp (series  2/image 24). No underlying osseous abnormality.  No evidence of calvarial fracture.  IMPRESSION:  Motion degraded images.  Extracranial hematoma overlying the right parietal scalp. No  evidence of calvarial fracture.  No evidence of acute intracranial abnormality.  CT CERVICAL SPINE  Findings: Straightening of the cervical spine, likely positional.  No evidence of fracture or dislocation. The vertebral body heights  are maintained. The dens appears intact.  No prevertebral soft tissue swelling.  Mild multilevel degenerative changes.  Visualized thyroid is unremarkable.  Endotracheal tube.  Visualized lung apices are notable for mild paraseptal  emphysematous changes.  IMPRESSION:  No evidence of traumatic injury to the cervical spine.  Mild multilevel degenerative changes.  07/09/11, Left heart cath HEMODYNAMICS: Aortic pressure was 171/92; LV pressure was 179/16; LVEDP 26. There was no gradient between the left ventricle and aorta.  ANGIOGRAPHIC DATA: The left main coronary artery is widely patent.  The left anterior descending artery is a large vessel which wraps around the apex. It appears angiographic normal. There is a large first diagonal which appears widely patent. The second  and third diagonals are small but patent.  The left circumflex artery is a large vessel. There is a large OM1 which presents grafting normal. The circumflex system appears in grafting normal. There are small OM1 and OM 2 vessels..  The right coronary artery is a large  dominant vessel. The PLA is small but patent. The PDA is a large vessel and widely patent. There is a large conus branch. The JR 4 catheter tends to select the conus branch.Marland Kitchen  LEFT VENTRICULOGRAM: Left ventricular angiogram was done in the 30 RAO projection and revealed normal left ventricular wall motion and systolic function with an estimated ejection fraction of 55%. LVEDP was 26 mmHg.  IMPRESSIONS:  1. No significant coronary artery disease.  2. Normal left ventricular systolic function. LVEDP 26 mmHg. Ejection fraction 55% 3. Transvenous pacemaker placement. Rate of 60. Output 5 milliamps. Despite multiple attempts at positioning, unable to maintain capture at an output of 2 or below. Stable rhythm with an output of 5 milliamps. We'll keep transcutaneous pacer in place to be used as a backup. RECOMMENDATION: Intubated and sedated. He is on the [employer] sun protocol. Further cardiac management per Dr. Rennis Golden. He may need a permanent pacemaker.  Discharge Exam: Blood pressure 137/84, pulse 89, temperature 97 F (36.1 C), temperature source Oral, resp. rate 20, height 5\' 11"  (1.803 m), weight 71.9 kg (158 lb 8.2 oz), SpO2 94.00%.   Disposition: Final discharge disposition not confirmed  Discharge Orders    Future Orders Please Complete By Expires   Diet - low sodium heart healthy      Increase activity slowly      Call MD for:  redness, tenderness, or signs of infection (pain, swelling, redness, odor or green/yellow discharge around incision site)        Medication List  As of 07/18/2011  2:02 PM   STOP taking these medications         albuterol 108 (90 BASE) MCG/ACT inhaler      aspirin 81 MG chewable tablet      ibuprofen 200 MG tablet      POTASSIUM PO         TAKE these medications         acetaminophen 325 MG tablet   Commonly known as: TYLENOL   Take 1-2 tablets (325-650 mg total) by mouth every 4 (four) hours as needed.      dextrose 5 % SOLN 50 mL with cefTRIAXone 2 G  SOLR 2 g   Inject 2 g into the vein daily.           Follow-up Information    Follow up with Crecencio Mc, MD.   Contact information:   [address] [place], [address] [facility] [place] [address] [phone]          Signed: Dwana Melena 07/18/2011, 2:02 PM

## 2011-07-18 NOTE — Progress Notes (Signed)
[facility]  Subjective: Patient in a pleasant mood this a.m.  Some mild sharp CP in ribs(Left lateral).  He says it feels similar to when he had a rib injury years ago.  Pain is reproducible.  Also hurts when he coughs and with deep inspiration.   No SOB.  Sleeping better.  Objective: Vital signs in last 24 hours: Temp:  [98 F (36.7 C)-98.4 F (36.9 C)] 98 F (36.7 C) (01/28 0619) Pulse Rate:  [68-98] 68  (01/28 0619) Resp:  [18] 18  (01/28 0619) BP: (138-152)/(74-85) 143/85 mmHg (01/28 0619) SpO2:  [95 %-98 %] 98 % (01/28 0619) Last BM Date: 07/16/11  Intake/Output from previous day: 01/27 0701 - 01/28 0700 In: 480 [P.O.:480] Out: 1100 [Urine:1100] Intake/Output this shift:    Medications Current Facility-Administered Medications  Medication Dose Route Frequency Provider Last Rate Last Dose  . acetaminophen (TYLENOL) tablet 325-650 mg  325-650 mg Oral Q4H PRN Thurmon Fair, MD   650 mg at 07/16/11 0716  . cefTRIAXone (ROCEPHIN) 2 g in dextrose 5 % 50 mL IVPB  2 g Intravenous Q24H Severiano Gilbert, PHARMD   2 g at 07/17/11 1458  . HYDROcodone-acetaminophen (NORCO) 5-325 MG per tablet 1-2 tablet  1-2 tablet Oral Q4H PRN Thurmon Fair, MD   2 tablet at 07/17/11 2225  . ondansetron (ZOFRAN) injection 4 mg  4 mg Intravenous Q6H PRN Mihai Croitoru, MD      . sodium chloride 0.9 % injection 10-40 mL  10-40 mL Intracatheter PRN Runell Gess, MD   10 mL at 07/18/11 0849  . zolpidem (AMBIEN) tablet 10 mg  10 mg Oral QHS PRN Abelino Derrick, PA      . DISCONTD: gentamicin (GARAMYCIN) 220 mg in dextrose 5 % 100 mL IVPB  220 mg Intravenous Q24H Severiano Gilbert, PHARMD   220 mg at 07/16/11 2255    PE: General appearance: alert, cooperative and no distress Neck: no adenopathy, no carotid bruit, no JVD, supple, symmetrical, trachea midline and thyroid not enlarged, symmetric, no tenderness/mass/nodules Lungs: mild expiratory wheezing RL Heart: regular  rate and rhythm, S1, S2 normal, no murmur, click, rub or gallop Extremities: no LEE.  pulses 2+ bilat. Pulses: 2+ and symmetric  No results found for this or any previous visit (from the past 24 hour(s)).        Assessment/Plan   Principal Problem:  *Cardiac arrest - ventricular fibrillation, 07/09/11  Active Problems:  Respiratory arrest, vent dependent  Complete heart block, requiring external pacing then temp wire pacing.  NSTEMI (non-ST elevated myocardial infarction)   Plan:  S/P PTVPM Friday secondary to CHB.  Pt had 2+ BC of Strep bacteremia.  Needs 8 more days of OP ATBX; end date feb 4th per ID, probably via [facility].  Pacing on Tele, NSR. BP-143/85; HR-68 stable.  Afebrile.  Home Health RN arranged for home IV abx.  Likely DC home today.   LOS: 9 days    HAGER,BRYAN W 07/18/2011 9:40 AM    Patient seen and examined. Agree with assessment and plan. Will ambulate more today and send home later today with continued antibiotic  Per ID.  Lennette Bihari, MD, [facility] 07/18/2011 12:59 PM

## 2011-07-18 NOTE — Discharge Instructions (Addendum)
Home Health Services  arranged with [facility]. [phone]. 1. RN for iv antibiotic administration.  Dual-Chamber Pacemaker A pacemaker is a small, lightweight, battery-powered device that is implanted under the skin in the upper chest. Your caregiver may place a pacemaker if your heartbeat is too slow (bradycardia) or if you experience symptoms from a slow heartbeat. A dual-chamber pacemaker has 2 leads (electrodes) that are connected in your heart. One lead is placed in the upper chamber of the heart, called the right atrium. The second lead is placed in the lower part of the heart, called the right ventricle. Dual-chamber pacemakers may pace in both the upper chamber and lower chamber. By doing so, correct rhythm and function are often maintained. When the heart rate is too slow, the pacemaker senses the heartbeat and will pace the heart at a programmed rate. CAUSES  Different conditions can cause a slow heart rate. Some of these can include:  Sick sinus syndrome. This is a type of slow heart rate where the "pacemaker" of the heart does not work very well. It is often related to aging.   Heart attack (myocardial infarction). This can damage the heart muscle and cause a slow heart beat.   Heart block. This is a condition where the signal that causes the heart to beat does not communicate very well between the upper chambers of the heart and the lower chambers of the heart.   Some heart medications that control fast heart rates or other abnormal heart rhythms can also cause a slow heart rate.  SYMPTOMS  A very slow heart rate results in the heart not pumping enough blood to your body. Symptoms of a slow heart rate can include:  Passing out (fainting).   Confusion.   Shortness of breath.   Tiredness (fatigue).   Ankle swelling.   Chest discomfort or pain.  DIAGNOSIS  Tests will be done to look at how your heart works and beats. This can include:  A physical exam.   An  electrocardiogram (ECG). An ECG records your heart beat on a strip of paper for your caregiver to look at.   Continuous ECG monitoring:   Holter monitor or an Event monitor. These devices record your heart rhythm and can be worn for 24 or more hours at a time. Your caregiver can then look at the recorded history of your heartbeat.   An electrophysiology study. This is a test to study the heart's electrical system. If your heart has a disruption in its electrical pathway, a slow heart beat can occur.  PACEMAKER IMPLANTATION  Do not eat or drink for 6 hours before the procedure or as told by your caregiver.   Pacemaker implantation usually takes about one hour.   Your skin on your upper chest will be cleaned with germ-killing soap.   Sedation will be given through an IV. This will help you relax during the procedure.   The site of the incision, often just below a collarbone, will be injected with numbing medicine.   The insulated electrode is inserted through a large vein in your chest. Then, using a special type of X-ray (fluoroscopy), the tip is positioned in the target area of your heart. The end of the pacemaker lead is fixated to your heart by a corkscrew tip or by small "tines" (soft anchor hooks).   The connection between the pacemaker electrode and the heart is checked to ensure optimal contact and placement.   After your pacemaker is implanted, you  will need to stay in the hospital to make sure the pacemaker is working correctly. You will be able to go home when your caregiver feels it is safe for you to do so.  HOME CARE INSTRUCTIONS   Excessive movement of the arm next to the new pacemaker can cause the electrodes to dislodge. Your caregiver will determine how many days the upper arm should not be moved excessively. It is usually three or more days.   The incision needs to be kept dry as told by your caregiver. As with any surgery, if the incision becomes swollen, red or pus  (yellow or tan drainage) appears, call your caregiver right away.   Your caregiver may use small strips of tape hold the incision closed. They should be allowed to fall off naturally. Do not pull the strips of tape off.   Digital cell phones should be kept 12 inches away from the pacemaker. Hold them at the ear on the side opposite of the pacer.   Never leave a cell phone in a pocket over the pacemaker.   Avoid strong electro-magnetic fields. You will not be able to have an MRI scan because of the strong magnets.   The pacemaker battery should last several years. The pacemaker needs to be checked at regular intervals as told by your caregiver.  RISKS AND COMPLICATIONS An implanted pacemaker has risks. Some of these can include:  Infection.   The pacemaker electrode can become dislodged. If this should happen, a second surgery would be needed to reposition it.   During pacemaker implantation, it is possible to puncture the lung. This is a very rare occurrence.  SEEK MEDICAL CARE IF:   You have dizziness or pass out.   You feel your heart "skipping" beats or feel your heart "racing."   Hiccups that do not go away.   You develop redness, swelling or pain at the pacemaker insertion site.   The pacemaker insertion site has yellow drainage or there is a bad odor coming from the insertion site.   An unexplained temperature of 102 F (38.9 C) or above develops.  MAKE SURE YOU:   Understand these instructions.   Will watch your condition.   Will get help right away if you are not doing well or get worse.  Document Released: 04/03/2009 Document Revised: 02/16/2011 Document Reviewed: 04/03/2009 [facility] Patient Information 2012 [place], [place].

## 2011-07-18 NOTE — Plan of Care (Signed)
Problem: Phase III Progression Outcomes Goal: Electrophysiology Consult per MD Outcome: Completed/Met Date Met:  07/18/11 Permanent pacer implant 07/15/11

## 2011-07-18 NOTE — Plan of Care (Signed)
Problem: Phase III Progression Outcomes Goal: Cardiac Cath per orders Outcome: Completed/Met Date Met:  07/18/11 Cardiac cath 07/09/11

## 2011-07-18 NOTE — Progress Notes (Signed)
Patient ID: Casey Richard, male   DOB: 08/06/1955, 56 y.o.   MRN: [id number]  INFECTIOUS DISEASE PROGRESS NOTE    Date of Admission:  07/09/2011   Total days of antibiotics 8         Principal Problem:  *Cardiac arrest - ventricular fibrillation, 07/09/11 Active Problems:  Respiratory arrest, vent dependent  Complete heart block, requiring external pacing then temp wire pacing.  NSTEMI (non-ST elevated myocardial infarction)      . cefTRIAXone (ROCEPHIN)  IV  2 g Intravenous Q24H    Subjective: He is feeling better.  Objective: Temp:  [98 F (36.7 C)-98.4 F (36.9 C)] 98 F (36.7 C) (01/28 0619) Pulse Rate:  [68-98] 68  (01/28 0619) Resp:  [18] 18  (01/28 0619) BP: (138-152)/(74-85) 143/85 mmHg (01/28 0619) SpO2:  [95 %-98 %] 98 % (01/28 0619)  General: His pacemaker site is clean and dry   Lab Results Lab Results  Component Value Date   WBC 11.3* 07/15/2011   HGB 13.6 07/15/2011   HCT 39.6 07/15/2011   MCV 88.8 07/15/2011   PLT 170 07/15/2011    Lab Results  Component Value Date   CREATININE 0.89 07/15/2011   BUN 26* 07/15/2011   NA 140 07/15/2011   K 4.1 07/15/2011   CL 106 07/15/2011   CO2 26 07/15/2011    Lab Results  Component Value Date   ALT 63* 07/12/2011   AST 36 07/12/2011   ALKPHOS 77 07/12/2011   BILITOT 0.6 07/12/2011       Microbiology: Recent Results (from the past 240 hour(s))  MRSA PCR SCREENING     Status: Normal   Collection Time   07/09/11  2:51 PM      Component Value Range Status Comment   MRSA by PCR NEGATIVE  NEGATIVE  Final   CULTURE, BLOOD (ROUTINE X 2)     Status: Normal   Collection Time   07/09/11  3:08 PM      Component Value Range Status Comment   Specimen Description BLOOD LEFT HAND   Final    Special Requests BOTTLES DRAWN AEROBIC ONLY 5CC   Final    Culture  Setup Time [id number]   Final    Culture     Final    Value: VIRIDANS STREPTOCOCCUS     Note: Gram Stain Report Called to,Read Back By and Verified With: RN D.  SULLIVAN ON 07/10/11 AT 1557 BY TEDAR   Report Status 07/12/2011 FINAL   Final    Organism ID, Bacteria VIRIDANS STREPTOCOCCUS   Final   CULTURE, BLOOD (ROUTINE X 2)     Status: Normal   Collection Time   07/09/11  3:47 PM      Component Value Range Status Comment   Specimen Description BLOOD LEFT ARM   Final    Special Requests BOTTLES DRAWN AEROBIC ONLY 10CC   Final    Culture  Setup Time [id number]   Final    Culture     Final    Value: VIRIDANS STREPTOCOCCUS     Note: SUSCEPTIBILITIES PERFORMED ON PREVIOUS CULTURE WITHIN THE LAST 5 DAYS.     Note: Gram Stain Report Called to,Read Back By and Verified With: RN D. SULLIVAN ON 07/10/11 AT 1000 BY TEDAR   Report Status 07/12/2011 FINAL   Final   URINE CULTURE     Status: Normal   Collection Time   07/09/11 10:28 PM      Component Value Range Status Comment  Specimen Description URINE, RANDOM   Final    Special Requests NONE   Final    Culture  Setup Time [id number]   Final    Colony Count NO GROWTH   Final    Culture NO GROWTH   Final    Report Status 07/10/2011 FINAL   Final   CULTURE, RESPIRATORY     Status: Normal   Collection Time   07/10/11 12:48 PM      Component Value Range Status Comment   Specimen Description TRACHEAL ASPIRATE   Final    Special Requests NONE   Final    Gram Stain     Final    Value: ABUNDANT WBC PRESENT,BOTH PMN AND MONONUCLEAR     NO SQUAMOUS EPITHELIAL CELLS SEEN     ABUNDANT GRAM POSITIVE RODS     FEW GRAM NEGATIVE COCCOBACILLI   Culture Non-Pathogenic Oropharyngeal-type Flora Isolated.   Final    Report Status 07/12/2011 FINAL   Final   CULTURE, BLOOD (ROUTINE X 2)     Status: Normal   Collection Time   07/11/11 12:48 PM      Component Value Range Status Comment   Specimen Description BLOOD LEFT ARM   Final    Special Requests BOTTLES DRAWN AEROBIC AND ANAEROBIC 10CC   Final    Culture  Setup Time [id number]   Final    Culture NO GROWTH 5 DAYS   Final    Report Status 07/17/2011 FINAL    Final   CULTURE, BLOOD (ROUTINE X 2)     Status: Normal   Collection Time   07/11/11 12:57 PM      Component Value Range Status Comment   Specimen Description BLOOD LEFT FOREARM   Final    Special Requests BOTTLES DRAWN AEROBIC ONLY 5CC   Final    Culture  Setup Time [id number]   Final    Culture NO GROWTH 5 DAYS   Final    Report Status 07/17/2011 FINAL   Final     Studies/Results: Chest Portable 1 View Post Insertion To Confirm Placement As Interpreted By Radiologist  07/16/2011  *RADIOLOGY REPORT*  Clinical Data: Left-sided PICC line placed  PORTABLE CHEST - 1 VIEW  Comparison: Chest radiograph 07/16/2011  Findings: Interval placement of a left-sided PICC line with tip in the distal SVC.  No pneumothorax.  No pulmonary edema.  Right-sided pacemaker noted.  IMPRESSION: Interval placement left PICC line in good position.  Original Report Authenticated By: Genevive Bi, M.D.     Assessment: There no obvious complications of his viridans strep bacteremia. He needs 6 more days of IV ceftriaxone.  Plan: 1. Discharge home soon to complete 6 more days of ceftriaxone 2. I will sign off now. Please call if I can be of further assistance while he is here.   Cliffton Asters, MD [facility] [phone] pager   [phone] cell 07/18/2011, 12:34 PM

## 2011-07-18 NOTE — Progress Notes (Signed)
   CARE MANAGEMENT NOTE 07/18/2011  Patient:  Casey Richard, Casey Richard   Account Number:  [id number]  Date Initiated:  07/12/2011  Documentation initiated by:  [facility]  Subjective/Objective Assessment:   Vfib arrest, CHB     Action/Plan:   lives at home with wife, Liborio Nixon # [phone]   Anticipated DC Date:  07/19/2011   Anticipated DC Plan:  HOME W HOME HEALTH SERVICES      DC Planning Services  CM consult      [facility] Choice  HOME HEALTH   Choice offered to / List presented to:  C-1 Patient        HH arranged  HH-1 RN  HH-10 DISEASE MANAGEMENT      HH agency  [facility]   Status of service:  Completed, signed off Medicare Important Message given?   (If response is "NO", the following Medicare IM given date fields will be blank) Date Medicare IM given:   Date Additional Medicare IM given:    Discharge Disposition:  HOME W HOME HEALTH SERVICES  Per UR Regulation:  Reviewed for med. necessity/level of care/duration of stay  Comments:  07-18-11 1543 Tomi Bamberger, RN,BSN [phone] CM spoke to pt and he chose [facility] for services. Pt has picc line in place and has received  dose of rocephin for today. Pt is from home with wife and children. CM made referral with [facility] for services. SOc to begin within 24-48 hours post d/c. CM also relayed to [facility] with [facility] that pt has a suprapubic cath and if they can monitor cath. Pt has to set up an appointment with urology.  07/17/2011 0930 Pt states he is a citizen of [place] and has health coverage in [place]. His insurance agent is also a close friend and he will discuss with him on today how to get health insurance information. Provided pt with list of different HH agencies in this area. Explained [facility] can set up his abx for home as a self pay. NCM will continue to assist with arranging home abx at d/c. Isidoro Donning RN CCM Case Mgmt phone [phone]  07-15-11 2:14pm Avie Arenas, RNBSN - [phone] UR completed.  07-12-11  3:10pm Avie Arenas, RNBSN [phone] UR Completed.

## 2011-08-04 ENCOUNTER — Other Ambulatory Visit: Payer: Self-pay | Admitting: Urology

## 2011-08-08 ENCOUNTER — Other Ambulatory Visit: Payer: Self-pay | Admitting: Urology

## 2011-08-10 ENCOUNTER — Encounter (HOSPITAL_COMMUNITY): Payer: Self-pay | Admitting: Pharmacy Technician

## 2011-08-11 ENCOUNTER — Encounter (HOSPITAL_COMMUNITY)
Admission: RE | Admit: 2011-08-11 | Discharge: 2011-08-11 | Disposition: A | Discharge status: Home / Self Care | Payer: Medicaid Other | Source: Ambulatory Visit | Attending: Urology | Admitting: Urology

## 2011-08-11 ENCOUNTER — Other Ambulatory Visit: Payer: Self-pay

## 2011-08-11 ENCOUNTER — Encounter (HOSPITAL_COMMUNITY): Payer: Self-pay

## 2011-08-11 HISTORY — DX: Other amnesia: R41.3

## 2011-08-11 HISTORY — DX: Chronic kidney disease, unspecified: N18.9

## 2011-08-11 HISTORY — DX: Other seasonal allergic rhinitis: J30.2

## 2011-08-11 HISTORY — DX: Cardiac arrhythmia, unspecified: I49.9

## 2011-08-11 LAB — BASIC METABOLIC PANEL
BUN: 18 mg/dL (ref 6–23)
CO2: 30 mEq/L (ref 19–32)
Calcium: 9.1 mg/dL (ref 8.4–10.5)
Chloride: 101 mEq/L (ref 96–112)
Creatinine, Ser: 0.9 mg/dL (ref 0.50–1.35)
GFR calc Af Amer: >90 mL/min (ref >90)
GFR calc non Af Amer: >90 mL/min (ref >90)
Glucose, Bld: 82 mg/dL (ref 70–99)
Potassium: 4.5 mEq/L (ref 3.5–5.1)
Sodium: 138 mEq/L (ref 135–145)

## 2011-08-11 LAB — CBC
HCT: 40.8 % (ref 39.0–52.0)
Hemoglobin: 13.6 g/dL (ref 13.0–17.0)
MCH: 30.5 pg (ref 26.0–34.0)
MCHC: 33.3 g/dL (ref 30.0–36.0)
MCV: 91.5 fL (ref 78.0–100.0)
Platelets: 172 10*3/uL (ref 150–400)
RBC: 4.46 MIL/uL (ref 4.22–5.81)
RDW: 13.9 % (ref 11.5–15.5)
WBC: 7.2 10*3/uL (ref 4.0–10.5)

## 2011-08-11 LAB — SURGICAL PCR SCREEN
MRSA, PCR: NEGATIVE
Staphylococcus aureus: NEGATIVE

## 2011-08-11 MED ORDER — FLUCONAZOLE IN SODIUM CHLORIDE 400-0.9 MG/200ML-% IV SOLN: 100.0000 mg | INTRAVENOUS | Status: DC

## 2011-08-11 NOTE — Patient Instructions (Addendum)
20 Casey Richard  08/11/2011   Your procedure is scheduled on:  08/19/11   Surgery 1300-1400  Report to [facility] at    1030  FRIDAY   AM.  Call this number if you have problems the morning of surgery: [phone]     Or PST   [id number]  [facility]   Remember:   Do not eat food:After Midnight. Thursday night  May have clear liquids  Until MIDNIGHT  Thursday NIGHT  Clear liquids include soda, tea, black coffee, apple or grape juice, broth.  Take these medicines the morning of surgery with A SIP OF WATER:none   Do not wear jewelry, make-up or nail polish.  Do not wear lotions, powders, or perfumes. You may wear deodorant.  Do not shave 48 hours prior to surgery.  Do not bring valuables to the hospital.  Contacts, dentures or bridgework may not be worn into surgery.  Leave suitcase in the car. After surgery it may be brought to your room.  For patients admitted to the hospital, checkout time is 11:00 AM the day of discharge.   Patients discharged the day of surgery will not be allowed to drive home.  Name and phone number of your driver:    wife                                                                  Special Instructions: CHG Shower Use Special Wash: 1/2 bottle night before surgery and 1/2 bottle morning of surgery. REGULAR SOAP FACE AND PRIVATES                           MEN-MAY SHAVE FACE MORNING OF SURGERY  Please read over the following fact sheets that you were given: MRSA Information

## 2011-08-11 NOTE — Pre-Procedure Instructions (Signed)
1435- per Dr Payton Doughty- anesthesia does not need to see today at PST visit

## 2011-08-19 ENCOUNTER — Ambulatory Visit (HOSPITAL_COMMUNITY)
Admission: RE | Admit: 2011-08-19 | Discharge: 2011-08-19 | Disposition: A | Discharge status: Home / Self Care | Payer: Medicaid Other | Source: Ambulatory Visit | Attending: Urology | Admitting: Urology

## 2011-08-19 ENCOUNTER — Ambulatory Visit (HOSPITAL_COMMUNITY): Payer: Medicaid Other | Admitting: Anesthesiology

## 2011-08-19 ENCOUNTER — Encounter (HOSPITAL_COMMUNITY): Payer: Self-pay | Admitting: Anesthesiology

## 2011-08-19 ENCOUNTER — Encounter (HOSPITAL_COMMUNITY): Payer: Self-pay

## 2011-08-19 ENCOUNTER — Encounter (HOSPITAL_COMMUNITY)
Admission: RE | Disposition: A | Discharge status: Home / Self Care | Payer: Self-pay | Source: Ambulatory Visit | Attending: Urology

## 2011-08-19 DIAGNOSIS — Z01810 Encounter for preprocedural cardiovascular examination: Secondary | ICD-10-CM | POA: Insufficient documentation

## 2011-08-19 DIAGNOSIS — Z01812 Encounter for preprocedural laboratory examination: Secondary | ICD-10-CM | POA: Insufficient documentation

## 2011-08-19 DIAGNOSIS — IMO0002 Reserved for concepts with insufficient information to code with codable children: Secondary | ICD-10-CM | POA: Insufficient documentation

## 2011-08-19 DIAGNOSIS — J449 Chronic obstructive pulmonary disease, unspecified: Secondary | ICD-10-CM | POA: Insufficient documentation

## 2011-08-19 DIAGNOSIS — J4489 Other specified chronic obstructive pulmonary disease: Secondary | ICD-10-CM | POA: Insufficient documentation

## 2011-08-19 HISTORY — PX: CYSTOSCOPY WITH URETHRAL DILATATION: SHX5125

## 2011-08-19 SURGERY — CYSTOSCOPY, WITH URETHRAL DILATION
Anesthesia: General | Wound class: Clean Contaminated

## 2011-08-19 MED ORDER — CIPROFLOXACIN HCL 500 MG PO TABS: 500.0000 mg | ORAL_TABLET | Freq: Two times a day (BID) | ORAL | Status: AC

## 2011-08-19 MED ORDER — FENTANYL CITRATE 0.05 MG/ML IJ SOLN
25.0000 ug | INTRAMUSCULAR | Status: DC | PRN
  Administered 2011-08-19 (×3): 25 ug via INTRAVENOUS

## 2011-08-19 MED ORDER — LIDOCAINE HCL (CARDIAC) 20 MG/ML IV SOLN
INTRAVENOUS | Status: DC | PRN
  Administered 2011-08-19: 50 mg via INTRAVENOUS

## 2011-08-19 MED ORDER — CIPROFLOXACIN IN D5W 400 MG/200ML IV SOLN
INTRAVENOUS | Status: AC
Start: 2011-08-19 — End: 2011-08-19
  Filled 2011-08-19: qty 200

## 2011-08-19 MED ORDER — PROPOFOL 10 MG/ML IV BOLUS
INTRAVENOUS | Status: DC | PRN
  Administered 2011-08-19: 200 mg via INTRAVENOUS

## 2011-08-19 MED ORDER — FENTANYL CITRATE 0.05 MG/ML IJ SOLN
INTRAMUSCULAR | Status: DC | PRN
  Administered 2011-08-19 (×3): 25 ug via INTRAVENOUS

## 2011-08-19 MED ORDER — PROMETHAZINE HCL 25 MG/ML IJ SOLN: 6.2500 mg | INTRAMUSCULAR | Status: DC | PRN

## 2011-08-19 MED ORDER — KETOROLAC TROMETHAMINE 30 MG/ML IJ SOLN
15.0000 mg | Freq: Once | INTRAMUSCULAR | Status: AC | PRN
  Administered 2011-08-19: 30 mg via INTRAVENOUS

## 2011-08-19 MED ORDER — IOHEXOL 350 MG/ML SOLN
INTRAVENOUS | Status: DC | PRN
  Administered 2011-08-19: 50 mL

## 2011-08-19 MED ORDER — ACETAMINOPHEN 10 MG/ML IV SOLN
INTRAVENOUS | Status: AC
  Filled 2011-08-19: qty 100

## 2011-08-19 MED ORDER — HYDROCODONE-ACETAMINOPHEN 5-300 MG PO TABS: 1.0000 | ORAL_TABLET | Freq: Four times a day (QID) | ORAL | Status: DC

## 2011-08-19 MED ORDER — CIPROFLOXACIN IN D5W 400 MG/200ML IV SOLN
400.0000 mg | INTRAVENOUS | Status: AC
  Administered 2011-08-19: 400 mg via INTRAVENOUS

## 2011-08-19 MED ORDER — 0.9 % SODIUM CHLORIDE (POUR BTL) OPTIME
TOPICAL | Status: DC | PRN
  Administered 2011-08-19: 1000 mL

## 2011-08-19 MED ORDER — ACETAMINOPHEN 10 MG/ML IV SOLN
INTRAVENOUS | Status: DC | PRN
  Administered 2011-08-19: 1000 mg via INTRAVENOUS

## 2011-08-19 MED ORDER — INDIGOTINDISULFONATE SODIUM 8 MG/ML IJ SOLN
INTRAMUSCULAR | Status: AC
  Filled 2011-08-19: qty 5

## 2011-08-19 MED ORDER — ONDANSETRON HCL 4 MG/2ML IJ SOLN
INTRAMUSCULAR | Status: DC | PRN
  Administered 2011-08-19: 4 mg via INTRAVENOUS

## 2011-08-19 MED ORDER — FENTANYL CITRATE 0.05 MG/ML IJ SOLN
INTRAMUSCULAR | Status: AC
  Filled 2011-08-19: qty 2

## 2011-08-19 MED ORDER — INDIGOTINDISULFONATE SODIUM 8 MG/ML IJ SOLN
INTRAMUSCULAR | Status: DC | PRN
  Administered 2011-08-19: 8 mL

## 2011-08-19 MED ORDER — IOHEXOL 300 MG/ML  SOLN
INTRAMUSCULAR | Status: AC
Start: 2011-08-19 — End: 2011-08-19
  Filled 2011-08-19: qty 1

## 2011-08-19 MED ORDER — KETOROLAC TROMETHAMINE 30 MG/ML IJ SOLN
INTRAMUSCULAR | Status: AC
  Filled 2011-08-19: qty 1

## 2011-08-19 MED ORDER — LACTATED RINGERS IV SOLN
INTRAVENOUS | Status: DC
  Administered 2011-08-19: 1000 mL via INTRAVENOUS

## 2011-08-19 SURGICAL SUPPLY — 28 items
ADAPTER CATH URET PLST 4-6FR (CATHETERS) IMPLANT
BAG URINE DRAINAGE (UROLOGICAL SUPPLIES) ×2 IMPLANT
BALLN NEPHROSTOMY (BALLOONS) ×2
BALLOON NEPHROSTOMY (BALLOONS) ×1 IMPLANT
CANISTER SUCT LVC 12 LTR MEDI- (MISCELLANEOUS) IMPLANT
CATH FOLEY 2W COUNCIL 20FR 5CC (CATHETERS) ×2 IMPLANT
CATH FOLEY 2WAY SLVR  5CC 14FR (CATHETERS) ×1
CATH FOLEY 2WAY SLVR 5CC 14FR (CATHETERS) ×1 IMPLANT
CATH INTERMIT  6FR 70CM (CATHETERS) ×2 IMPLANT
CATH ROBINSON RED A/P 14FR (CATHETERS) IMPLANT
CATH URET 5FR 28IN CONE TIP (BALLOONS)
CATH URET 5FR 28IN OPEN ENDED (CATHETERS) IMPLANT
CATH URET 5FR 70CM CONE TIP (BALLOONS) IMPLANT
CLOTH BEACON ORANGE TIMEOUT ST (SAFETY) ×2 IMPLANT
DRAPE CAMERA CLOSED 9X96 (DRAPES) ×2 IMPLANT
GLOVE BIO SURGEON STRL SZ7.5 (GLOVE) ×2 IMPLANT
GOWN STRL REIN XL XLG (GOWN DISPOSABLE) ×2 IMPLANT
GUIDEWIRE .038 (WIRE) IMPLANT
GUIDEWIRE ANG ZIPWIRE 038X150 (WIRE) IMPLANT
GUIDEWIRE STR DUAL SENSOR (WIRE) ×4 IMPLANT
NS IRRIG 1000ML POUR BTL (IV SOLUTION) ×2 IMPLANT
PACK CYSTO (CUSTOM PROCEDURE TRAY) ×2 IMPLANT
PLUG CATH AND CAP STER (CATHETERS) ×2 IMPLANT
SYR 50ML LL SCALE MARK (SYRINGE) ×4 IMPLANT
SYRINGE 10CC LL (SYRINGE) ×2 IMPLANT
SYRINGE IRR TOOMEY STRL 70CC (SYRINGE) IMPLANT
TOWEL OR 17X26 10 PK STRL BLUE (TOWEL DISPOSABLE) IMPLANT
WATER STERILE IRR 3000ML UROMA (IV SOLUTION) ×2 IMPLANT

## 2011-08-19 NOTE — Anesthesia Postprocedure Evaluation (Signed)
  Anesthesia Post-op Note  Patient: Casey Richard  Procedure(s) Performed: Procedure(s) (LRB): CYSTOSCOPY WITH URETHRAL DILATATION (N/A)  Patient Location: PACU  Anesthesia Type: General  Level of Consciousness: awake and alert   Airway and Oxygen Therapy: Patient Spontanous Breathing  Post-op Pain: mild  Post-op Assessment: Post-op Vital signs reviewed, Patient's Cardiovascular Status Stable, Respiratory Function Stable, Patent Airway and No signs of Nausea or vomiting  Post-op Vital Signs: stable  Complications: No apparent anesthesia complications

## 2011-08-19 NOTE — Op Note (Signed)
Preoperative diagnosis: Urethral stricture  Postoperative diagnosis: Urethral stricture  Procedure:  1. Cystoscopy 2. Balloon dilation of urethral stricture 3. Change of suprapubic tube 4. Retrograde urethrogram  Surgeon: Moody Bruins. M.D.  Anesthesia: General  Complications: None  Intraoperative findings: Retrograde urethrogram demonstrated a normal anterior urethra up to the level of the stricture which indicated a completely obliterated urethra with no contrast extending past the point of obstruction/stricture.  EBL: Minimal  Indication: Casey Richard  is a 56 y.o. patient with a urethral stricture. After reviewing the management options for treatment, he elected to proceed with the above surgical procedure(s). We have discussed the potential benefits and risks of the procedure, side effects of the proposed treatment, the likelihood of the patient achieving the goals of the procedure, and any potential problems that might occur during the procedure or recuperation. Informed consent has been obtained.  Description of procedure:  The patient was taken to the operating room and general anesthesia was induced.  The patient was placed in the dorsal lithotomy position, prepped and draped in the usual sterile fashion, and preoperative antibiotics were administered. A preoperative time-out was performed.   Urethroscopy was then performed with the 22 French rigid cystoscope up to the level of obstruction which appeared completely obliterated except for a very small opening. Omnipaque contrast was then injected through the scope into the distal urethra and no contrast was seen to extend beyond the point of obstruction. The suprapubic tube was then filled with a solution consisting of saline and indigo carmine. Suprapubic pressure was then applied and was seen in the blue dye coming from the small opening seen in the urethra. A 0.38 sensor guidewire was then advanced through this  opening and was seen to curl in the bladder on fluoroscopy. The Ultraxx nephrostomy balloon dilation set was then utilized for urethral dilation. The balloon catheter was placed over the wire and it was confirmed that blue dye was able to be withdrawn through the balloon catheter confirming placement within the bladder. Under fluoroscopic guidance, the balloon was inflated to 24 [place] with all waste removed from the balloon. The balloon was left inflated for 8 minutes. It was then deflated and removed with the wire left in the bladder. The rigid cystoscope was then placed over the wire was able to be passed easily into the bladder. The length of the stricture tissue appeared to be less than 1 cm. The bladder was examined and the suprapubic tube was identified. No bladder tumors, stones, or other significant mucosal pathology was identified except for expected bladder edema. The indwelling punch suprapubic tube was then removed and a new 14 French catheter was placed over wire into the bladder and confirmed cystoscopically to be in position. The balloon was then inflated. A 20 French council tip catheter was then placed over the wire to the urethra into the bladder. It was irrigated to confirm proper placement. The suprapubic tube was plugged with a catheter plug and the urethral catheter was placed to drainage.   The patient appeared to tolerate the procedure well and without complications.  The patient was able to be awakened and transferred to the recovery unit in satisfactory condition.   Moody Bruins MD

## 2011-08-19 NOTE — Interval H&P Note (Signed)
History and Physical Interval Note:  08/19/2011 1:04 PM  Casey Richard  has presented today for surgery, with the diagnosis of Urethral Stricture  The various methods of treatment have been discussed with the patient and family. After consideration of risks, benefits and other options for treatment, the patient has consented to  Procedure(s) (LRB): CYSTOSCOPY WITH URETHRAL DILATATION (N/A) as a surgical intervention .  The patients' history has been reviewed, patient examined, no change in status, stable for surgery.  Questions were answered to the patient's satisfaction.     Yuritzi Kamp,LES

## 2011-08-19 NOTE — Anesthesia Preprocedure Evaluation (Signed)
Anesthesia Evaluation  Patient identified by MRN, date of birth, ID band Patient awake    Reviewed: Allergy & Precautions, H&P , NPO status , Patient's Chart, lab work & pertinent test results  Airway Mallampati: II TM Distance: >3 FB Neck ROM: Full    Dental No notable dental hx.    Pulmonary neg pulmonary ROS,  clear to auscultation  Pulmonary exam normal       Cardiovascular + Past MI and neg cardio ROS + dysrhythmias + pacemaker Regular Normal 3rd degree HB   Neuro/Psych Negative Neurological ROS  Negative Psych ROS   GI/Hepatic negative GI ROS, Neg liver ROS,   Endo/Other  Negative Endocrine ROS  Renal/GU negative Renal ROS  Genitourinary negative   Musculoskeletal negative musculoskeletal ROS (+)   Abdominal   Peds negative pediatric ROS (+)  Hematology negative hematology ROS (+)   Anesthesia Other Findings   Reproductive/Obstetrics negative OB ROS                           Anesthesia Physical Anesthesia Plan  ASA: III  Anesthesia Plan: General   Post-op Pain Management:    Induction: Intravenous  Airway Management Planned: LMA  Additional Equipment:   Intra-op Plan:   Post-operative Plan:   Informed Consent: I have reviewed the patients History and Physical, chart, labs and discussed the procedure including the risks, benefits and alternatives for the proposed anesthesia with the patient or authorized representative who has indicated his/her understanding and acceptance.   Dental advisory given  Plan Discussed with: CRNA  Anesthesia Plan Comments:         Anesthesia Quick Evaluation

## 2011-08-19 NOTE — Transfer of Care (Signed)
Immediate Anesthesia Transfer of Care Note  Patient: Casey Richard  Procedure(s) Performed: Procedure(s) (LRB): CYSTOSCOPY WITH URETHRAL DILATATION (N/A)  Patient Location: PACU  Anesthesia Type: General  Level of Consciousness: sedated, patient cooperative and responds to stimulaton  Airway & Oxygen Therapy: Patient Spontanous Breathing and Patient connected to face mask oxgen  Post-op Assessment: Report given to PACU RN and Post -op Vital signs reviewed and stable  Post vital signs: Reviewed and stable  Complications: No apparent anesthesia complications

## 2011-08-19 NOTE — Discharge Instructions (Addendum)
Suprapubic tube will be capped.  Urethral catheter will be to drainage.  Use larger drainage bag at night but it is ok to use leg bag during daytime.  Finish fluconazole until gone.  Take ciprofloxicin for 5 days.  Call if fever > 101 or if catheter is not draining well.Foley Catheter Care, Adult A soft, flexible tube (Foley catheter) has been placed in your bladder. This may be done to temporarily help with urine drainage after an operation or to relieve blockage from an enlarged prostate gland. HOME CARE INSTRUCTIONS  If you are going home with a Foley catheter in place, follow these instructions: Taking Care of the Catheter:  Keep the area where the catheter leaves your body clean.   Attach the catheter to the leg so there is no tension on the catheter.   Keep the drainage bag below the level of the bladder, but keep it OFF the floor.   Do not take long soaking baths. Your caregiver will give instructions about showering.   Wash your hands before touching ANYTHING related to the catheter or bag.   Using mild soap and warm water on a washcloth:   Clean the area closest to the catheter insertion site using a circular motion around the catheter.   Clean the catheter itself by wiping AWAY from the insertion site for several inches down the tube.   NEVER wipe upward as this could sweep bacteria up into the urethra (tube in your body that normally drains the bladder) and cause infection.  Taking Care of the Drainage Bags:  Two drainage bags will be taken home: a large overnight drainage bag, and a smaller leg bag which fits underneath clothing.   It is okay to wear the overnight bag at any time, but NEVER wear the smaller leg bag at night.   Keep the drainage bag well below the level of your bladder. This prevents backflow of urine into the bladder and allows the urine to drain freely.   Anchor the tubing to your leg to prevent pulling or tension on the catheter. Use tape or a leg  strap provided by the hospital.   Empty the drainage bag when it is  to  full. Wash your hands before and after touching the bag.   Periodically check the tubing for kinks to make sure there is no pressure on the tubing which could restrict the flow of urine.  Changing the Drainage Bags:  Cleanse both ends of the clean bag with alcohol before changing.   Pinch off the rubber catheter to avoid urine spillage during the disconnection.   Disconnect the dirty bag and connect the clean one.   Empty the dirty bag carefully to avoid a urine spill.   Attach the new bag to the leg with tape or a leg strap.  Cleaning the Drainage Bags:  Whenever a drainage bag is disconnected, it must be cleaned quickly so it is ready for the next use.   Wash the bag in warm, soapy water.   Rinse the bag thoroughly with warm water.   Soak the bag for 30 minutes in a solution of white vinegar and water (1 cup vinegar to 1 quart warm water).   Rinse with warm water.  SEEK MEDICAL CARE IF:   Some pain develops in the kidney (lower back) area.   The urine is cloudy or smells bad.   There is some blood in the urine.   The catheter becomes clogged and/or there is no  urine drainage.  SEEK IMMEDIATE MEDICAL CARE IF:   You have moderate or severe pain in the kidney region.   You start to throw up (vomit).   Blood fills the tube.   Worsening belly (abdominal) pain develops.   You have a fever.  MAKE SURE YOU:   Understand these instructions.   Will watch your condition.   Will get help right away if you are not doing well or get worse.  Document Released: 06/06/2005 Document Revised: 02/16/2011 Document Reviewed: 12/01/2006 [facility] Patient Information 2012 [place], [place].  Drink plenty water

## 2011-08-19 NOTE — H&P (Signed)
History of Present Illness  Casey Richard is a 56 year old with the following urologic history:  1) Urethral stricture/injury: He was initially seen as a hospital consultation in January 2013 after presenting in cardiac arrest at which time he had a bradyarrhythmia requiring pacemaker placement.  Attempts to place a urethral catheter were unsuccessful by the nursing staff.  I performed cystoscopy at the time and his urethra appeared obliterated without identification of a true lumen. He required suprapupic tube placement at the bedside.  Interval history:  He follows up today after discharge from hospital. His suprapubic tube has been draining well and grossly clear urine without hematuria or obstruction. He states that he did void some urine was in the hospital and a small amount since discharge from hospital but the great majority of urine output has been via the suprapubic tube. He has been afebrile. He was not conscious when I initially evaluated him in the hospital and he was unable to provide a history.  Today, he tells me that while he did have some baseline voiding symptoms including intermittency and hesitancy, these symptoms were not severe and he denies any risk factors for urethral stricture disease including no history of STDs, prior urologic surgery, or other congenital urologic anomalies. He has not been treated for BPH or voiding symptoms. He also denies a history of hematuria, urolithiasis, GU malignancy/trauma/surgery previously.   Past Medical History Problems  1. History of  Chronic Obstructive Pulmonary Disease 496 2. Former Smoker V15.82 3. History of  Rhythm Disorder 427.9  Surgical History Problems  1. History of  Bladder Aspiration With Insertion Of Suprapubic Catheter 2. History of  Cystoscopy (Diagnostic)  Current Meds 1. No Reported Medications  Allergies Medication  1. No Known Drug Allergies  Family History Problems  1. Family history of  Chronic  Obstructive Pulmonary Disease 2. Family history of  Heart Disease V17.49  Social History Problems  1. Marital History - Currently Married Denied  2. History of  Alcohol Use  Review of Systems Constitutional, skin, eye, otolaryngeal, hematologic/lymphatic, cardiovascular, pulmonary, endocrine, musculoskeletal, gastrointestinal, neurological and psychiatric system(s) were reviewed and pertinent findings if present are noted.  Cardiovascular: no chest pain.  Respiratory: no shortness of breath.    Vitals Vital Signs [Data Includes: Last 1 Day]  12Feb2013 09:12AM  BMI Calculated: 23.1 BSA Calculated: 1.94 Height: 5 ft 11 in Weight: 165 lb  Blood Pressure: 129 / 83 Heart Rate: 73  Physical Exam Constitutional: Well nourished and well developed . No acute distress.  ENT:. The ears and nose are normal in appearance.  Neck: The appearance of the neck is normal and no neck mass is present.  Pulmonary: No respiratory distress and normal respiratory rhythm and effort.  Cardiovascular: Heart rate and rhythm are normal . No peripheral edema.  Abdomen: The abdomen is soft and nontender. No masses are palpated. No CVA tenderness. No hernias are palpable. No hepatosplenomegaly noted. He has an indwelling 12 French suprapubic catheter in the lower mid abdomen draining grossly clear urine.  Rectal: Rectal exam demonstrates normal sphincter tone, no tenderness and no masses. Prostate size is estimated to be 55 g. The prostate has no nodularity and is not tender. The left seminal vesicle is nonpalpable. The right seminal vesicle is nonpalpable. The perineum is normal on inspection.  Genitourinary: Examination of the penis demonstrates no discharge, no masses, no lesions and a normal meatus. The scrotum is without lesions. The right epididymis is palpably normal and non-tender. The left  epididymis is palpably normal and non-tender. The right testis is non-tender and without masses. The left testis is  non-tender and without masses.  Lymphatics: The femoral and inguinal nodes are not enlarged or tender.  Skin: Normal skin turgor, no visible rash and no visible skin lesions.  Neuro/Psych:. Mood and affect are appropriate.    Results/Data Urine [Data Includes: Last 1 Day]   12Feb2013  COLOR AMBER   APPEARANCE CLOUDY   SPECIFIC GRAVITY 1.025   pH 5.5   GLUCOSE NEG mg/dL  BILIRUBIN SMALL   KETONE TRACE mg/dL  BLOOD LARGE   PROTEIN 100 mg/dL  UROBILINOGEN 0.2 mg/dL  NITRITE NEG   LEUKOCYTE ESTERASE NEG   SQUAMOUS EPITHELIAL/HPF RARE   WBC 4-6 WBC/hpf  RBC TNTC RBC/hpf  BACTERIA MODERATE   CRYSTALS Calcium Oxalate crystals noted   CASTS NONE SEEN   Other MANY YEAST     His urine has been cultured.   Procedure  He was placed supine and flexible cystourethroscopy was performed after he was prepped with Betadine. The anterior urethra appeared normal up to the level of the bulbar urethra where his urethra was noted to be completely obliterated with no obvious evidence of a true urethral lumen. The cystoscope was then removed. He tolerated the procedure well.     Assessment Assessed  1. 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 UA With REFLEX  Done: 12Feb2013 08:56AM Traumatic Urethral Stricture (598.1)  2. URINE CULTURE  Done: 12Feb2013 3. Follow-up Schedule Surgery Office  Follow-up  Done: 12Feb2013  Discussion/Summary 1. Bulbar urethral stricture: I recommended that he be scheduled for an endoscopic evaluation in the operating room including imaging studies with a retrograde urethrogram and cystogram to define the urethral defect along with operative cystoscopy and possible placement of a wire through the lumen. I will likely utilize indigo carmine or methylene blue installed into the bladder to help identify the true urethral lumen. If the urethral lumen can be reestablished, I recommended that he undergo balloon dilation of his urethral  stricture and placement of a catheter. If not, his suprapubic tube will simply be changed. We have reviewed the potential risks and complications as well as alternative options associated with this procedure. I will plan to contact his cardiologist to confirm his recent cardiac evaluation and ensure that he would be stable enough from a cardiac standpoint to proceed with this endoscopic procedure with relatively low risk. He also will be treated preoperatively with culture specific antibiotics.  CC: Dr. Rachelle Hora Croitoru   Verified Results URINE CULTURE1 12Feb2013 09:52AM1 Lyda Perone  Source : CLEAN CATCH SPECIMEN TYPE: CATH URINE  [Aug 04, 2011 10:13PM Eilan Mcinerny] Please tell patient he should begin fluconazole 5 days prior to surgery date. Will call in.  [Aug 02, 2011 9:51AM Young, Veronica] sp tube   Test Name Result Flag Reference  CULTURE, URINE1 Culture, Urine1    ===== COLONY COUNT: =====  >=100,000 COLONIES/ML   FINAL REPORT: Yeast

## 2011-08-29 ENCOUNTER — Encounter (HOSPITAL_COMMUNITY): Payer: Self-pay | Admitting: Urology

## 2012-02-09 ENCOUNTER — Other Ambulatory Visit: Payer: Self-pay | Admitting: Neurology

## 2012-02-09 DIAGNOSIS — R413 Other amnesia: Secondary | ICD-10-CM

## 2012-02-09 DIAGNOSIS — R42 Dizziness and giddiness: Secondary | ICD-10-CM

## 2012-02-15 ENCOUNTER — Ambulatory Visit (HOSPITAL_COMMUNITY)
Admission: RE | Admit: 2012-02-15 | Discharge: 2012-02-15 | Disposition: A | Discharge status: Home / Self Care | Payer: Medicaid Other | Source: Ambulatory Visit | Attending: Neurology | Admitting: Neurology

## 2012-02-15 DIAGNOSIS — J329 Chronic sinusitis, unspecified: Secondary | ICD-10-CM | POA: Insufficient documentation

## 2012-02-15 DIAGNOSIS — E236 Other disorders of pituitary gland: Secondary | ICD-10-CM | POA: Insufficient documentation

## 2012-02-15 DIAGNOSIS — R413 Other amnesia: Secondary | ICD-10-CM

## 2012-02-15 DIAGNOSIS — R42 Dizziness and giddiness: Secondary | ICD-10-CM

## 2012-02-15 MED ORDER — GADOBENATE DIMEGLUMINE 529 MG/ML IV SOLN
15.0000 mL | Freq: Once | INTRAVENOUS | Status: AC | PRN
  Administered 2012-02-15: 15 mL via INTRAVENOUS

## 2012-02-15 NOTE — Progress Notes (Signed)
MRI scan completed;  Patient remained asymptomatic.  Rep here to reprogram pacemeaker.

## 2012-02-15 NOTE — Progress Notes (Signed)
Patient monitored during MRI scan because of pacemaker.  Pacemaker reps present and has reprogrammed pacemaker to MRI sure (safe) scan mode.  HR  89 paced DOO mode  BP  118/72 O2  98% RA  Patient has no complaints at this time.

## 2012-02-21 ENCOUNTER — Other Ambulatory Visit: Payer: Self-pay | Admitting: Neurology

## 2012-02-21 DIAGNOSIS — S02109A Fracture of base of skull, unspecified side, initial encounter for closed fracture: Secondary | ICD-10-CM

## 2012-02-23 ENCOUNTER — Ambulatory Visit
Admission: RE | Admit: 2012-02-23 | Discharge: 2012-02-23 | Disposition: A | Discharge status: Home / Self Care | Payer: Medicaid Other | Source: Ambulatory Visit | Attending: Neurology | Admitting: Neurology

## 2012-02-23 DIAGNOSIS — S069X9A Unspecified intracranial injury with loss of consciousness of unspecified duration, initial encounter: Secondary | ICD-10-CM

## 2012-02-23 DIAGNOSIS — S02109A Fracture of base of skull, unspecified side, initial encounter for closed fracture: Secondary | ICD-10-CM

## 2012-07-16 ENCOUNTER — Other Ambulatory Visit: Payer: Self-pay | Admitting: Otolaryngology

## 2012-07-16 DIAGNOSIS — J322 Chronic ethmoidal sinusitis: Secondary | ICD-10-CM

## 2012-07-16 DIAGNOSIS — J321 Chronic frontal sinusitis: Secondary | ICD-10-CM

## 2012-07-16 DIAGNOSIS — J301 Allergic rhinitis due to pollen: Secondary | ICD-10-CM

## 2012-07-16 DIAGNOSIS — J320 Chronic maxillary sinusitis: Secondary | ICD-10-CM

## 2012-07-30 ENCOUNTER — Other Ambulatory Visit: Payer: Medicaid Other

## 2012-11-13 IMAGING — CR DG CHEST 1V PORT
1 series · 1 of 1 positions shown · non-contrast
Comparison: 07/12/2011; 07/11/2011; chest CT - 07/12/2011

CLINICAL DATA: Shortness of breath, evaluate for aspiration
pneumonia

PORTABLE CHEST - 1 VIEW

[AP]
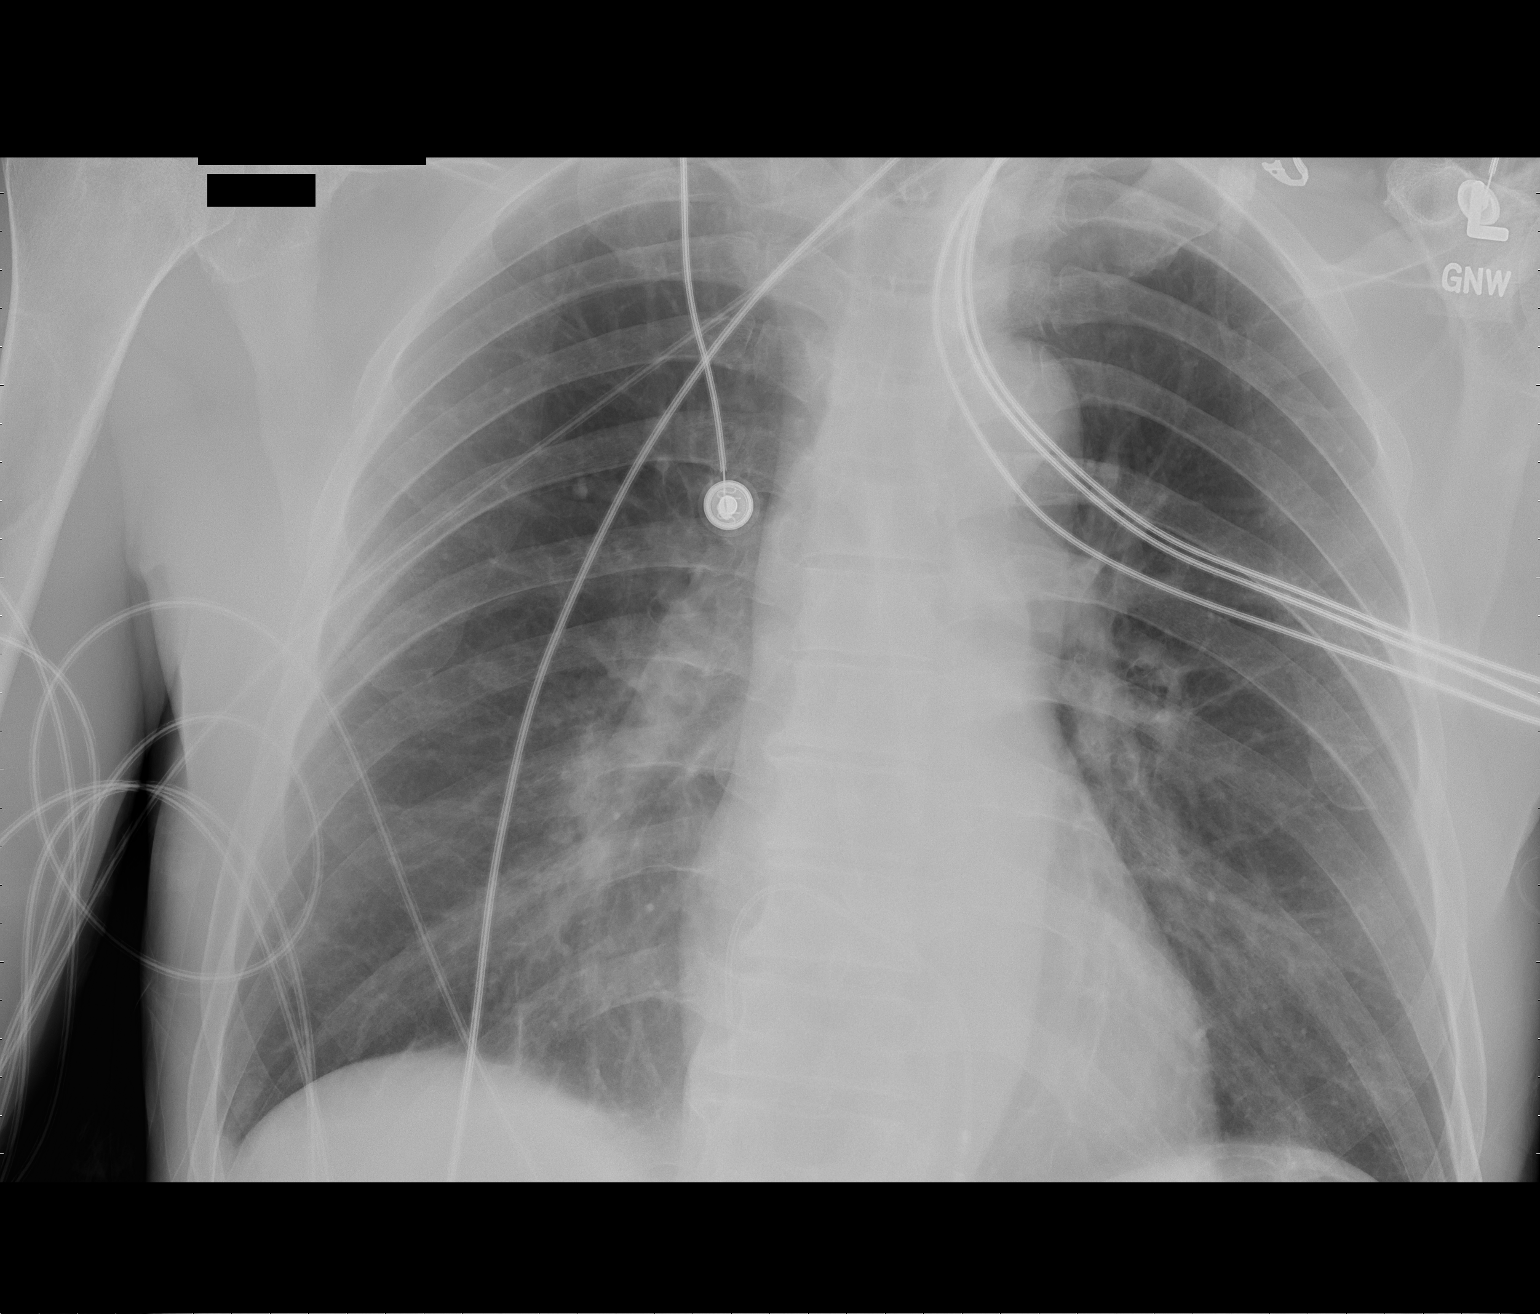

[1 of 1 positions shown; findings below may reference images not displayed]

FINDINGS: Grossly unchanged cardiac silhouette and mediastinal
contours. Interval removal of left jugular approach central venous
catheter, otherwise, stable positioning of support apparatus.
Improved aeration of the bilateral lung bases.  No new focal
airspace opacities.  There is minimal blunting of right
costophrenic angle suggestive of a small right-sided pleural
effusion.  No definite left-sided pleural effusion.  No
pneumothorax. No acute osseous abnormalities.
IMPRESSION: Improved aeration of the bilateral lung bases with decreased
bibasilar opacities suggestive of resolving atelectasis versus
infiltrate.

## 2012-11-14 IMAGING — CR DG CHEST 1V PORT
1 series · 1 of 1 positions shown · non-contrast
Comparison: Chest radiograph 07/16/2011

CLINICAL DATA: Left-sided PICC line placed

PORTABLE CHEST - 1 VIEW

[view not recorded]
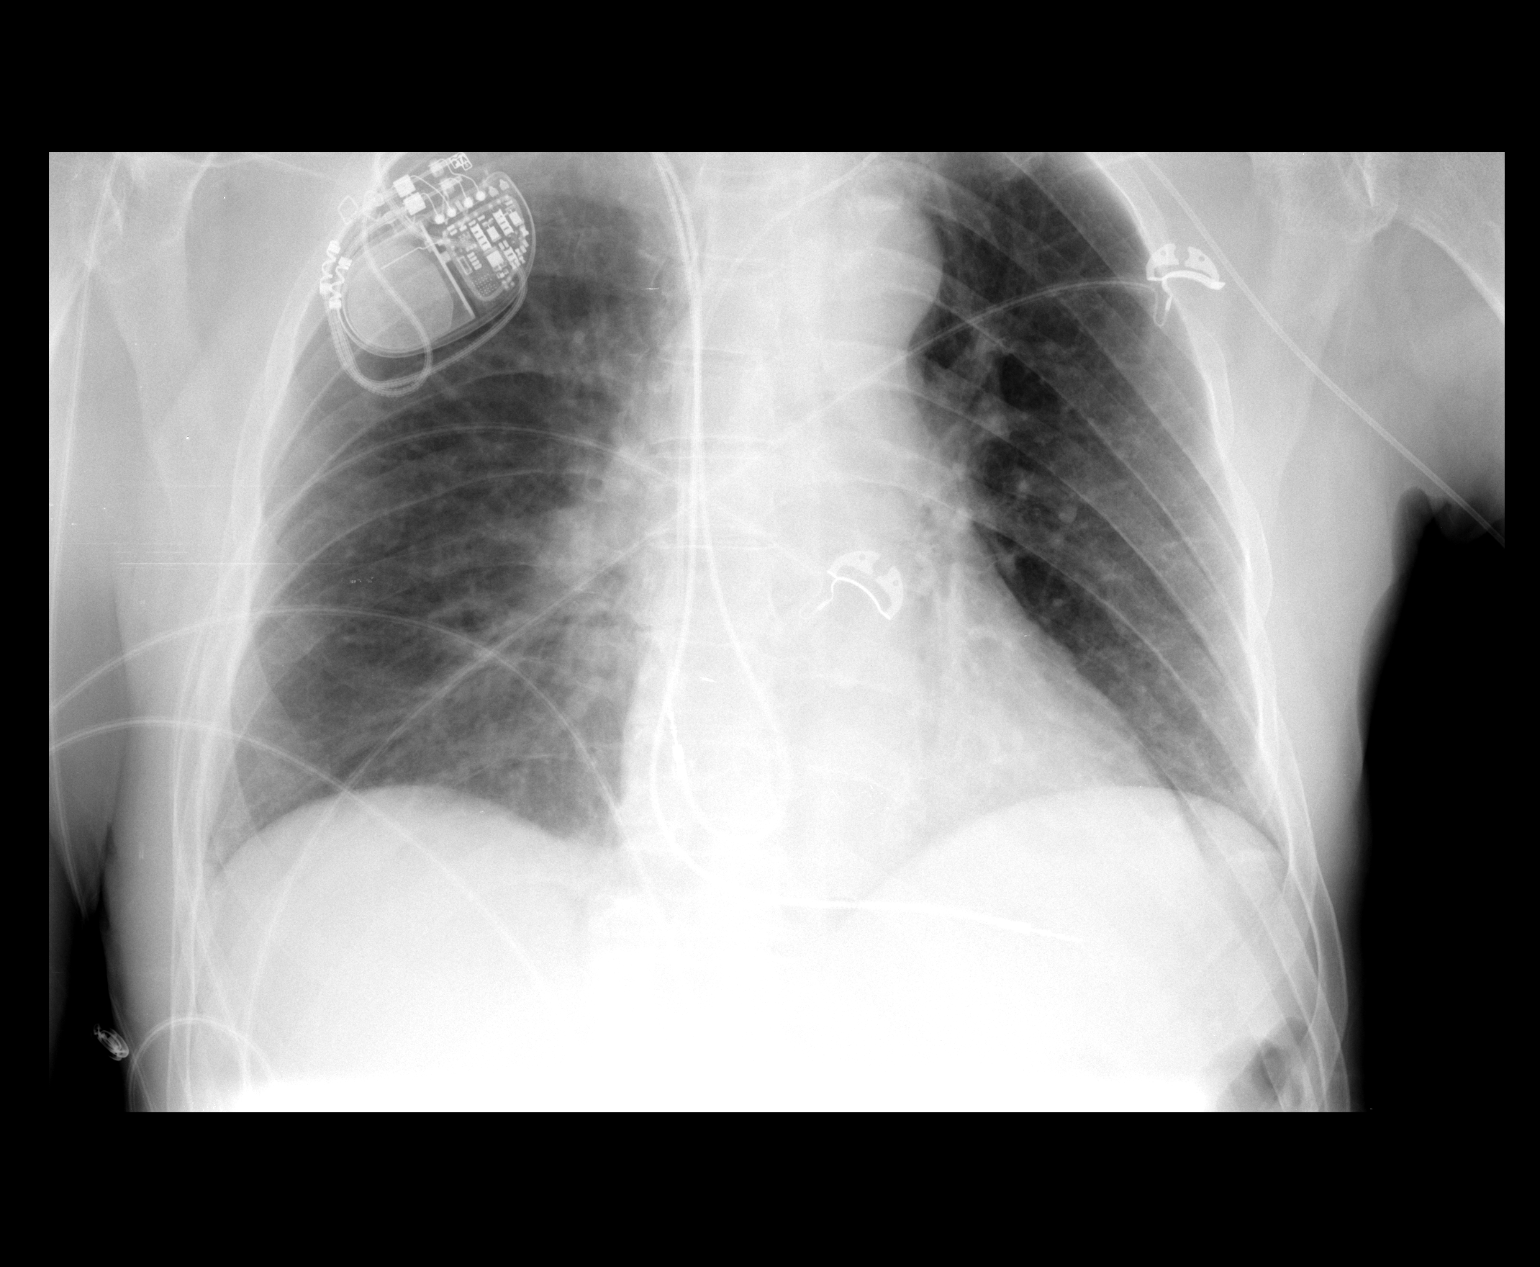

[1 of 1 positions shown; findings below may reference images not displayed]

FINDINGS: Interval placement of a left-sided PICC line with tip in
the distal SVC.  No pneumothorax.  No pulmonary edema.  Right-sided
pacemaker noted.
IMPRESSION: Interval placement left PICC line in good position.

## 2012-11-29 ENCOUNTER — Encounter: Payer: Self-pay | Admitting: Cardiovascular Disease

## 2012-11-29 ENCOUNTER — Encounter: Payer: Self-pay | Admitting: *Deleted

## 2012-11-29 ENCOUNTER — Ambulatory Visit (INDEPENDENT_AMBULATORY_CARE_PROVIDER_SITE_OTHER): Payer: Self-pay | Admitting: Cardiovascular Disease

## 2012-11-29 VITALS — BP 110/70 | HR 70 | Resp 16 | Ht 71.0 in | Wt 170.0 lb

## 2012-11-29 DIAGNOSIS — I442 Atrioventricular block, complete: Secondary | ICD-10-CM

## 2012-11-29 DIAGNOSIS — Z950 Presence of cardiac pacemaker: Secondary | ICD-10-CM

## 2012-11-29 DIAGNOSIS — R413 Other amnesia: Secondary | ICD-10-CM

## 2012-11-29 LAB — PACEMAKER DEVICE OBSERVATION
AL AMPLITUDE: 3.9 mv
AL IMPEDENCE PM: 416 Ohm
AL THRESHOLD: 1 V
ATRIAL PACING PM: 33.8
BAMS-0001: 176 {beats}/min
BATTERY VOLTAGE: 3 V
RV LEAD AMPLITUDE: 6.5 mv
RV LEAD IMPEDENCE PM: 480 Ohm
RV LEAD THRESHOLD: 1 V
VENTRICULAR PACING PM: 99.9

## 2012-11-29 NOTE — Patient Instructions (Addendum)
Your physician recommends that you schedule a follow-up appointment in: 12 months.  

## 2012-11-29 NOTE — Progress Notes (Signed)
In office pacemaker interrogation. Normal device function. No changes made this session. 

## 2012-11-30 ENCOUNTER — Encounter: Payer: Self-pay | Admitting: Cardiovascular Disease

## 2012-11-30 DIAGNOSIS — I442 Atrioventricular block, complete: Secondary | ICD-10-CM | POA: Insufficient documentation

## 2012-11-30 DIAGNOSIS — R413 Other amnesia: Secondary | ICD-10-CM | POA: Insufficient documentation

## 2012-11-30 NOTE — Assessment & Plan Note (Signed)
Suspect this occurred on background of extreme bradycardia due to 3rd degree AV block, no structural heart disease was identified.

## 2012-11-30 NOTE — Assessment & Plan Note (Signed)
No underlying escape rhythm

## 2012-11-30 NOTE — Assessment & Plan Note (Signed)
Due to hypoxic-ischemic brain injury. 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

## 2012-11-30 NOTE — Progress Notes (Signed)
Patient ID: Casey Richard, male   DOB: [birth year], 57 y.o.   MRN: [id number]      Reason for office visit Complete heart block, pacemaker follow up  Casey Richard is doing well from a physical standpoint, but is troubled by his inability to work, because of memory problems following his resuscitated arrest 18 months ago. No cardiovascular complaints, decent exercise tolerance.  No Known Allergies  Current Outpatient Prescriptions  Medication Sig Dispense Refill  . acetaminophen (TYLENOL) 500 MG tablet Take 1,000 mg by mouth every 6 (six) hours as needed. Pain       . Hydrocodone-Acetaminophen 5-300 MG TABS Take 1-2 tablets by mouth every 6 (six) hours.  25 each  0  . Multiple Vitamin (MULITIVITAMIN WITH MINERALS) TABS Take 1 tablet by mouth every 3 (three) days.       No current facility-administered medications for this visit.   Facility-Administered Medications Ordered in Other Visits  Medication Dose Route Frequency Provider Last Rate Last Dose  . fluconazole (DIFLUCAN) IVPB 100 mg  100 mg Intravenous Q24H Crecencio Mc, MD        Past Medical History  Diagnosis Date  . Shortness of breath   . Seasonal allergies   . Memory loss, short term     from fall with heart block 1/13  . 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 Dysrhythmia 1/13    bradycardia, VF, cardiac arrest/complete heart block with pacer inserted/LOV Dr Royann Shivers 07/21/11 with interrogation and anesthesia guideline order on chart. Chest x ray 1/13 EPIC,, [facility] and [facility]  1/13 EPIC  . Cardiac arrest   . CHB (complete heart block)   . Presence of permanent cardiac pacemaker 07/15/2011    Medtronic Revo    Past Surgical History  Procedure Laterality Date  . Pacemaker insertion  07/15/11    Medtronic Revo  . Cystoscopy with urethral dilatation  08/19/2011    Procedure: CYSTOSCOPY WITH URETHRAL DILATATION;  Surgeon: Crecencio Mc, MD;  Location: [facility];  Service: Urology;  Laterality: N/A;   Balloon Dilation of Urethral Stricture, retro-urethrogram, Suprapubic change   . Cardiac catheterization  07/09/2011    Normal coronaries    Family History  Problem Relation Age of Onset  . Coronary artery disease Mother     AICD  . COPD Father   . Coronary artery disease Brother     History   Social History  . Marital Status: Married    Spouse Name: N/A    Number of Children: N/A  . Years of Education: N/A   Occupational History  . Not on file.   Social History Main Topics  . Smoking status: Former Smoker    Types: Cigarettes    Quit date: 06/20/1989  . Smokeless tobacco: Not on file  . Alcohol Use: No  . Drug Use: No  . Sexually Active:    Other Topics Concern  . Not on file   Social History Narrative  . No narrative on file    Review of systems: Short term memory problems.  The patient specifically denies any chest pain at rest or with exertion, dyspnea at rest or with exertion, orthopnea, paroxysmal nocturnal dyspnea, syncope, palpitations, focal neurological deficits, intermittent claudication, lower extremity edema, unexplained weight gain, cough, hemoptysis or wheezing.  The patient also denies abdominal pain, nausea, vomiting, dysphagia, diarrhea, constipation, polyuria, polydipsia, dysuria, hematuria, frequency, urgency, abnormal bleeding or bruising, fever, chills, unexpected weight changes, mood swings, change  in skin or hair texture, change in voice quality, auditory or visual problems, allergic reactions or rashes, new musculoskeletal complaints other than usual "aches and pains" in right shoulder.   PHYSICAL EXAM BP 110/70  Pulse 70  Resp 16  Ht 5\' 11"  (1.803 m)  Wt 170 lb (77.111 kg)  BMI 23.72 kg/m2  General: Alert, oriented x3, no distress Head: no evidence of trauma, PERRL, EOMI, no exophtalmos or lid lag, no myxedema, no xanthelasma; normal ears, nose and oropharynx Neck: normal jugular venous pulsations and no hepatojugular reflux; brisk  carotid pulses without delay and no carotid bruits Chest: clear to auscultation, no signs of consolidation by percussion or palpation, normal fremitus, symmetrical and full respiratory excursions; healthy right subclavian pM site Cardiovascular: normal position and quality of the apical impulse, regular rhythm, normal first and second heart sounds, no murmurs, rubs or gallops Abdomen: no tenderness or distention, no masses by palpation, no abnormal pulsatility or arterial bruits, normal bowel sounds, no hepatosplenomegaly Extremities: no clubbing, cyanosis or edema; 2+ radial, ulnar and brachial pulses bilaterally; 2+ right femoral, posterior tibial and dorsalis pedis pulses; 2+ left femoral, posterior tibial and dorsalis pedis pulses; no subclavian or femoral bruits Neurological: grossly nonfocal   EKG: A sensed, V paced  Lipid Panel  No results found for this basename: chol, trig, hdl, cholhdl, vldl, ldlcalc    BMET    Component Value Date/Time   NA 138 08/11/2011 1440   K 4.5 08/11/2011 1440   CL 101 08/11/2011 1440   CO2 30 08/11/2011 1440   GLUCOSE 82 08/11/2011 1440   BUN 18 08/11/2011 1440   CREATININE 0.90 08/11/2011 1440   CALCIUM 9.1 08/11/2011 1440   GFRNONAA >90 08/11/2011 1440   GFRAA >90 08/11/2011 1440     ASSESSMENT AND PLAN Presence of permanent cardiac pacemaker Medtronic Revo, MRI conditional device with normal function. He is pacemaker dependent. 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  Cardiac arrest - ventricular fibrillation, 07/09/11 Suspect this occurred on background of extreme bradycardia due to 3rd degree AV block, no structural heart disease was identified.  Memory loss, short term Due to hypoxic-ischemic brain injury. 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  CHB (complete heart block) No  underlying escape rhythm   Orders Placed This Encounter  Procedures  . Pacemaker Device Observation  . EKG 12-Lead   No orders of the defined types were placed in this encounter.    Junious Silk, MD, [facility] [phone] office [phone] pager

## 2012-11-30 NOTE — Assessment & Plan Note (Signed)
Medtronic Revo, MRI conditional device with normal function. He is pacemaker dependent. 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

## 2013-10-30 ENCOUNTER — Telehealth: Payer: Self-pay | Admitting: *Deleted

## 2013-10-30 NOTE — Telephone Encounter (Signed)
Informed patient that we have not been receiving his remotes. Patient states that he will send one tomorrow and notify the office to ensure that it has been received.

## 2013-11-01 ENCOUNTER — Telehealth: Payer: Self-pay | Admitting: Cardiovascular Disease

## 2013-11-01 NOTE — Telephone Encounter (Signed)
Patient is letting you know that he is transmitting this afternoon.

## 2013-12-06 ENCOUNTER — Telehealth: Payer: Self-pay | Admitting: *Deleted

## 2013-12-06 NOTE — Telephone Encounter (Signed)
F/u with patient on remote that was never received. 800# given again. Pt states he will inform me once the transmission is sent.

## 2013-12-11 ENCOUNTER — Encounter: Payer: Self-pay | Admitting: Interventional Cardiology

## 2014-02-07 ENCOUNTER — Ambulatory Visit (INDEPENDENT_AMBULATORY_CARE_PROVIDER_SITE_OTHER): Payer: Medicare Other | Admitting: Cardiovascular Disease

## 2014-02-07 ENCOUNTER — Encounter: Payer: Self-pay | Admitting: Cardiovascular Disease

## 2014-02-07 VITALS — BP 118/86 | HR 70 | Resp 16 | Ht 71.0 in | Wt 178.0 lb

## 2014-02-07 DIAGNOSIS — I442 Atrioventricular block, complete: Secondary | ICD-10-CM

## 2014-02-07 DIAGNOSIS — I4729 Other ventricular tachycardia: Secondary | ICD-10-CM

## 2014-02-07 DIAGNOSIS — Z950 Presence of cardiac pacemaker: Secondary | ICD-10-CM

## 2014-02-07 DIAGNOSIS — I472 Ventricular tachycardia: Secondary | ICD-10-CM

## 2014-02-07 DIAGNOSIS — I4901 Ventricular fibrillation: Secondary | ICD-10-CM

## 2014-02-07 DIAGNOSIS — R413 Other amnesia: Secondary | ICD-10-CM

## 2014-02-07 HISTORY — DX: Ventricular tachycardia: I47.2

## 2014-02-07 HISTORY — DX: Other ventricular tachycardia: I47.29

## 2014-02-07 LAB — MDC_IDC_ENUM_SESS_TYPE_INCLINIC
Battery Voltage: 2.99 V
Brady Statistic AP VP Percent: 41.16 %
Brady Statistic AP VS Percent: 0 %
Brady Statistic AS VP Percent: 58.84 %
Brady Statistic AS VS Percent: 0.01 %
Brady Statistic RA Percent Paced: 41.16 %
Brady Statistic RV Percent Paced: 99.99 %
Date Time Interrogation Session: 20150821090435
Lead Channel Impedance Value: 448 Ohm
Lead Channel Impedance Value: 480 Ohm
Lead Channel Pacing Threshold Amplitude: 0.5 V
Lead Channel Pacing Threshold Amplitude: 1 V
Lead Channel Pacing Threshold Pulse Width: 0.4 ms
Lead Channel Pacing Threshold Pulse Width: 0.4 ms
Lead Channel Sensing Intrinsic Amplitude: 3.5783
Lead Channel Setting Pacing Amplitude: 2 V
Lead Channel Setting Pacing Amplitude: 2.5 V
Lead Channel Setting Pacing Pulse Width: 0.4 ms
Lead Channel Setting Sensing Sensitivity: 2.1 mV
Zone Setting Detection Interval: 340 ms
Zone Setting Detection Interval: 340 ms

## 2014-02-07 LAB — PACEMAKER DEVICE OBSERVATION

## 2014-02-07 NOTE — Patient Instructions (Addendum)
Remote monitoring is used to monitor your pacemaker from home. This monitoring reduces the number of office visits required to check your device to one time per year. It allows [place] to keep an eye on the functioning of your device to ensure it is working properly. You are scheduled for a device check from home on 05-12-2014. You may send your transmission at any time that day. If you have a wireless device, the transmission will be sent automatically. After your physician reviews your transmission, you will receive a postcard with your next transmission date.  Your physician recommends that you schedule a follow-up appointment in: 6 months with Dr.Croitoru  You will receive a WireX cellular adaptor in within the next three months to connect to your Carelink monitor to send in further transmissions. This adaptor allows you to bypass your home phone connection and send in the transmissions using your nearby cell towers. Once the adaptor and the monitor are connected the process of sending the transmissions is the exact same. It is encouraged that you call the office the first time to ensure that the transmission has been received.

## 2014-02-07 NOTE — Progress Notes (Signed)
Patient ID: Casey Richard, male   DOB: 1955/06/25, 58 y.o.   MRN: [id number]     Reason for office visit Complete heart block, Pacemaker f/u  [name] presented with cardiac arrest at age 58 related to ventricular fibrillation. After resuscitation he was found to have complete heart block and it is presumed that the fibrillatory arrest occurred on the background of severe bradycardia. He has had short-term memory loss ever since that event. He does not have evidence of significant structural heart disease by coronary angiography or echocardiography. His brother also required a pacemaker at a relatively young age.  He has a dual-chamber MRI conditional pacemaker (Medtronic Revo) implanted in January 2013. The device is functioning normally. He has had occasional very brief episodes of nonsustained ventricular tachycardia.  On the current check 2 episodes of nonsustained VT have been found in the last 12 months. An 8-beat episode occurred around midnight on 02/04/2013. A 9 beat episode occurred around 9 PM on 01/29/2014. Neither event was symptomatic.  He has 100% ventricular pacing and roughly 41% atrial pacing. His level of activity is excellent, around 6 hours a day. Heart rate histogram distribution is favorable even without rate response.    No Known Allergies  Current Outpatient Prescriptions  Medication Sig Dispense Refill  . acetaminophen (TYLENOL) 500 MG tablet Take 1,000 mg by mouth every 6 (six) hours as needed. Pain       . Multiple Vitamin (MULITIVITAMIN WITH MINERALS) TABS Take 1 tablet by mouth as needed.        No current facility-administered medications for this visit.   Facility-Administered Medications Ordered in Other Visits  Medication Dose Route Frequency Provider Last Rate Last Dose  . fluconazole (DIFLUCAN) IVPB 100 mg  100 mg Intravenous Q24H Raynelle Bring, MD        Past Medical History  Diagnosis Date  . Shortness of breath   . Seasonal allergies   . Memory  loss, short term     from fall with heart block 1/13  . 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 Dysrhythmia 1/13    bradycardia, VF, cardiac arrest/complete heart block with pacer inserted/LOV Dr Sallyanne Kuster 07/21/11 with interrogation and anesthesia guideline order on chart. Chest x ray 1/13 EPIC,, TEE and [facility]  1/13 EPIC  . Cardiac arrest   . CHB (complete heart block)   . Presence of permanent cardiac pacemaker 07/15/2011    Medtronic Revo    Past Surgical History  Procedure Laterality Date  . Pacemaker insertion  07/15/11    Medtronic Revo  . Cystoscopy with urethral dilatation  08/19/2011    Procedure: CYSTOSCOPY WITH URETHRAL DILATATION;  Surgeon: Dutch Gray, MD;  Location: [facility];  Service: Urology;  Laterality: N/A;  Balloon Dilation of Urethral Stricture, retro-urethrogram, Suprapubic change   . Cardiac catheterization  07/09/2011    Normal coronaries    Family History  Problem Relation Age of Onset  . Coronary artery disease Mother     AICD  . COPD Father   . Coronary artery disease Brother     History   Social History  . Marital Status: Married    Spouse Name: N/A    Number of Children: N/A  . Years of Education: N/A   Occupational History  . Not on file.   Social History Main Topics  . Smoking status: Former Smoker    Types: Cigarettes    Quit date: 06/20/1989  .  Smokeless tobacco: Not on file  . Alcohol Use: No  . Drug Use: No  . Sexual Activity:    Other Topics Concern  . Not on file   Social History Narrative  . No narrative on file    Review of systems: The patient specifically denies any chest pain at rest or with exertion, dyspnea at rest or with exertion, orthopnea, paroxysmal nocturnal dyspnea, syncope, palpitations, focal neurological deficits, intermittent claudication, lower extremity edema, unexplained weight gain, cough, hemoptysis or wheezing.  The patient also denies abdominal pain, nausea,  vomiting, dysphagia, diarrhea, constipation, polyuria, polydipsia, dysuria, hematuria, frequency, urgency, abnormal bleeding or bruising, fever, chills, unexpected weight changes, mood swings, change in skin or hair texture, change in voice quality, auditory or visual problems, allergic reactions or rashes, new musculoskeletal complaints other than usual "aches and pains".   PHYSICAL EXAM BP 118/86  Pulse 70  Resp 16  Ht 5\' 11"  (1.803 m)  Wt 178 lb (80.74 kg)  BMI 24.84 kg/m2  General: Alert, oriented x3, no distress Head: no evidence of trauma, PERRL, EOMI, no exophtalmos or lid lag, no myxedema, no xanthelasma; normal ears, nose and oropharynx Neck: normal jugular venous pulsations and no hepatojugular reflux; brisk carotid pulses without delay and no carotid bruits Chest: clear to auscultation, no signs of consolidation by percussion or palpation, normal fremitus, symmetrical and full respiratory excursions Cardiovascular: normal position and quality of the apical impulse, regular rhythm, normal first and paradoxically split second heart sounds, no murmurs, rubs or gallops Abdomen: no tenderness or distention, no masses by palpation, no abnormal pulsatility or arterial bruits, normal bowel sounds, no hepatosplenomegaly Extremities: no clubbing, cyanosis or edema; 2+ radial, ulnar and brachial pulses bilaterally; 2+ right femoral, posterior tibial and dorsalis pedis pulses; 2+ left femoral, posterior tibial and dorsalis pedis pulses; no subclavian or femoral bruits Neurological: grossly nonfocal   EKG: A sensed, V paced  Lipid Panel  No results found for this basename: chol, trig, hdl, cholhdl, vldl, ldlcalc    BMET    Component Value Date/Time   NA 138 08/11/2011 1440   K 4.5 08/11/2011 1440   CL 101 08/11/2011 1440   CO2 30 08/11/2011 1440   GLUCOSE 82 08/11/2011 1440   BUN 18 08/11/2011 1440   CREATININE 0.90 08/11/2011 1440   CALCIUM 9.1 08/11/2011 1440   GFRNONAA >90 08/11/2011  1440   GFRAA >90 08/11/2011 1440     ASSESSMENT AND PLAN Presence of permanent cardiac pacemaker  Medtronic Revo, MRI conditional device with normal function. He is pacemaker dependent.  Cardiac arrest - ventricular fibrillation, 07/09/11  Suspect this occurred on background of extreme bradycardia due to 3rd degree AV block, no structural heart disease was identified. He has very rare episodes of brief asymptomatic nonsustained ventricular tachycardia, roughly once a year. Memory loss, short term  Due to hypoxic-ischemic brain injury.  CHB (complete heart block)  No underlying escape rhythm  Orders Placed This Encounter  Procedures  . Implantable device check   No orders of the defined types were placed in this encounter.    Holli Humbles, MD, [place] [phone] office [phone] pager

## 2014-02-12 ENCOUNTER — Encounter: Payer: Self-pay | Admitting: Cardiovascular Disease

## 2014-05-12 ENCOUNTER — Telehealth: Payer: Self-pay | Admitting: Cardiology

## 2014-05-12 ENCOUNTER — Encounter: Payer: Medicare Other | Admitting: *Deleted

## 2014-05-12 NOTE — Telephone Encounter (Signed)
LMOVM reminding pt to send remote transmission.   

## 2014-05-13 ENCOUNTER — Encounter: Payer: Self-pay | Admitting: Cardiology

## 2014-05-22 ENCOUNTER — Encounter: Payer: Medicare Other | Admitting: *Deleted

## 2014-05-23 ENCOUNTER — Telehealth: Payer: Self-pay | Admitting: Cardiology

## 2014-05-23 NOTE — Telephone Encounter (Signed)
Spoke with pt and reminded pt of remote transmission that is due today. Pt verbalized understanding.   

## 2014-05-29 ENCOUNTER — Encounter (HOSPITAL_COMMUNITY): Payer: Self-pay | Admitting: Interventional Cardiology

## 2014-07-03 ENCOUNTER — Ambulatory Visit (INDEPENDENT_AMBULATORY_CARE_PROVIDER_SITE_OTHER): Payer: Medicare Other | Admitting: *Deleted

## 2014-07-03 DIAGNOSIS — I442 Atrioventricular block, complete: Secondary | ICD-10-CM

## 2014-07-03 LAB — MDC_IDC_ENUM_SESS_TYPE_REMOTE
Battery Voltage: 2.99 V
Brady Statistic AP VP Percent: 39.88 %
Brady Statistic AP VS Percent: 0 %
Brady Statistic AS VP Percent: 60.11 %
Brady Statistic AS VS Percent: 0.01 %
Brady Statistic RA Percent Paced: 39.88 %
Brady Statistic RV Percent Paced: 99.99 %
Date Time Interrogation Session: 20160114183840
Lead Channel Impedance Value: 464 Ohm
Lead Channel Impedance Value: 472 Ohm
Lead Channel Sensing Intrinsic Amplitude: 4 mV
Lead Channel Setting Pacing Amplitude: 2 V
Lead Channel Setting Pacing Amplitude: 2.5 V
Lead Channel Setting Pacing Pulse Width: 0.4 ms
Lead Channel Setting Sensing Sensitivity: 2.1 mV
Zone Setting Detection Interval: 340 ms
Zone Setting Detection Interval: 340 ms

## 2014-07-03 NOTE — Progress Notes (Signed)
Remote pacemaker transmission.   

## 2014-07-17 ENCOUNTER — Encounter: Payer: Self-pay | Admitting: Cardiology

## 2014-07-23 ENCOUNTER — Encounter: Payer: Self-pay | Admitting: Cardiovascular Disease

## 2014-10-07 ENCOUNTER — Encounter: Payer: Self-pay | Admitting: Cardiovascular Disease

## 2014-10-07 ENCOUNTER — Ambulatory Visit (INDEPENDENT_AMBULATORY_CARE_PROVIDER_SITE_OTHER): Payer: Medicare Other | Admitting: Cardiovascular Disease

## 2014-10-07 VITALS — BP 136/83 | HR 70 | Ht 71.0 in | Wt 181.5 lb

## 2014-10-07 DIAGNOSIS — Z950 Presence of cardiac pacemaker: Secondary | ICD-10-CM

## 2014-10-07 DIAGNOSIS — I442 Atrioventricular block, complete: Secondary | ICD-10-CM

## 2014-10-07 NOTE — Patient Instructions (Signed)
Remote monitoring is used to monitor your Pacemaker from home. This monitoring reduces the number of office visits required to check your device to one time per year. It allows [place] to monitor the functioning of your device to ensure it is working properly. You are scheduled for a device check from home on January 07, 2015. You may send your transmission at any time that day. If you have a wireless device, the transmission will be sent automatically. After your physician reviews your transmission, you will receive a postcard with your next transmission date.  Dr. Sallyanne Kuster recommends that you schedule a follow-up appointment in: One Year.

## 2014-10-07 NOTE — Progress Notes (Signed)
Patient ID: Casey Richard, male   DOB: 1955/07/24, 59 y.o.   MRN: [id number]     Cardiology Office Note   Date:  10/07/2014   ID:  Casey Richard, DOB December 04, 1955, MRN [id number]  PCP:  Benito Mccreedy, MD  Cardiologist:   Sanda Klein, MD   Chief Complaint  Patient presents with  . Follow-up    dyspnea with heavy activity.       History of Present Illness: Casey Richard is a 59 y.o. male who presents for follow-up for complete heart block and pacemaker check. He feels well and has no somatic complaints. Short-term memory problems are still occasionally an issue.  He presented with cardiac arrest and a prolonged intensive care unit stay in 2013. He has a dual-chamber Medtronic Revo MRI conditional device. Battery voltage is 2.99 V (ERI 2.81 V). There is virtually no atrial pacing and there is 100% ventricular pacing. No episodes of tachycardia have been recorded. Heart rate histogram distribution, driven by sinus node is normal.    Past Medical History  Diagnosis Date  . Shortness of breath   . Seasonal allergies   . Memory loss, short term     from fall with heart block 1/13  . 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 Dysrhythmia 1/13    bradycardia, VF, cardiac arrest/complete heart block with pacer inserted/LOV Dr Sallyanne Kuster 07/21/11 with interrogation and anesthesia guideline order on chart. Chest x ray 1/13 EPIC,, TEE and [facility]  1/13 EPIC  . Cardiac arrest   . CHB (complete heart block)   . Presence of permanent cardiac pacemaker 07/15/2011    Medtronic Revo  . Nonsustained ventricular tachycardia 02/07/2014    Past Surgical History  Procedure Laterality Date  . Pacemaker insertion  07/15/11    Medtronic Revo  . Cystoscopy with urethral dilatation  08/19/2011    Procedure: CYSTOSCOPY WITH URETHRAL DILATATION;  Surgeon: Dutch Gray, MD;  Location: [facility];  Service: Urology;  Laterality: N/A;  Balloon Dilation of Urethral  Stricture, retro-urethrogram, Suprapubic change   . Cardiac catheterization  07/09/2011    Normal coronaries  . Left heart catheterization with coronary angiogram N/A 07/09/2011    Procedure: LEFT HEART CATHETERIZATION WITH CORONARY ANGIOGRAM;  Surgeon: Jettie Booze, MD;  Location: [facility] CATH LAB;  Service: Cardiovascular;  Laterality: N/A;  . Temporary pacemaker insertion Right 07/09/2011    Procedure: TEMPORARY PACEMAKER INSERTION;  Surgeon: Jettie Booze, MD;  Location: [facility] CATH LAB;  Service: Cardiovascular;  Laterality: Right;  . Permanent pacemaker insertion Left 07/15/2011    Procedure: PERMANENT PACEMAKER INSERTION;  Surgeon: Sanda Klein, MD;  Location: [facility] CATH LAB;  Service: Cardiovascular;  Laterality: Left;     Current Outpatient Prescriptions  Medication Sig Dispense Refill  . acetaminophen (TYLENOL) 500 MG tablet Take 1,000 mg by mouth every 6 (six) hours as needed. Pain     . Multiple Vitamin (MULITIVITAMIN WITH MINERALS) TABS Take 1 tablet by mouth as needed.      No current facility-administered medications for this visit.   Facility-Administered Medications Ordered in Other Visits  Medication Dose Route Frequency Provider Last Rate Last Dose  . fluconazole (DIFLUCAN) IVPB 100 mg  100 mg Intravenous Q24H Raynelle Bring, MD        Allergies:   Review of patient's allergies indicates no known allergies.    Social History:  The patient  reports that he quit smoking about 25 years ago. His smoking  use included Cigarettes. He does not have any smokeless tobacco history on file. He reports that he does not drink alcohol or use illicit drugs.   Family History:  The patient's family history includes COPD in his father; Coronary artery disease in his brother and mother.    ROS:  Please see the history of present illness.    The patient specifically denies any chest pain at rest or with exertion, dyspnea at rest or with exertion, orthopnea, paroxysmal nocturnal  dyspnea, syncope, palpitations, focal neurological deficits, intermittent claudication, lower extremity edema, unexplained weight gain, cough, hemoptysis or wheezing.  The patient also denies abdominal pain, nausea, vomiting, dysphagia, diarrhea, constipation, polyuria, polydipsia, dysuria, hematuria, frequency, urgency, abnormal bleeding or bruising, fever, chills, unexpected weight changes, mood swings, change in skin or hair texture, change in voice quality, auditory or visual problems, allergic reactions or rashes, new musculoskeletal complaints other than usual "aches and pains". All other systems are reviewed and negative.    PHYSICAL EXAM: VS:  BP 136/83 mmHg  Pulse 70  Ht 5\' 11"  (1.803 m)  Wt 181 lb 8 oz (82.328 kg)  BMI 25.33 kg/m2 , BMI Body mass index is 25.33 kg/(m^2).  General: Alert, oriented x3, no distress Head: no evidence of trauma, PERRL, EOMI, no exophtalmos or lid lag, no myxedema, no xanthelasma; normal ears, nose and oropharynx Neck: normal jugular venous pulsations and no hepatojugular reflux; brisk carotid pulses without delay and no carotid bruits Chest: clear to auscultation, no signs of consolidation by percussion or palpation, normal fremitus, symmetrical and full respiratory excursions Cardiovascular: normal position and quality of the apical impulse, regular rhythm, normal first and second heart sounds, no murmurs, rubs or gallops Abdomen: no tenderness or distention, no masses by palpation, no abnormal pulsatility or arterial bruits, normal bowel sounds, no hepatosplenomegaly Extremities: no clubbing, cyanosis or edema; 2+ radial, ulnar and brachial pulses bilaterally; 2+ right femoral, posterior tibial and dorsalis pedis pulses; 2+ left femoral, posterior tibial and dorsalis pedis pulses; no subclavian or femoral bruits Neurological: grossly nonfocal Psych: euthymic mood, full affect   EKG:  EKG is ordered today. The ekg ordered today shows AV sequential  pacing   Recent Labs: No results found for requested labs within last 365 days.    Lipid Panel No results found for: CHOL, TRIG, HDL, CHOLHDL, VLDL, LDLCALC, LDLDIRECT    Wt Readings from Last 3 Encounters:  10/07/14 181 lb 8 oz (82.328 kg)  02/07/14 178 lb (80.74 kg)  11/29/12 170 lb (77.111 kg)      ASSESSMENT AND PLAN:  Casey Richard is doing great and seems to have no sequelae from his cardiac arrest with the exception of short-term memory loss. He is pacemaker dependent due to complete heart block. Pacemaker function is normal.  he is pacemaker dependent.He has an MRI conditional device. Continue remote Care[employer] monitoring every 3 months and a office visit in one year.   Current medicines are reviewed at length with the patient today.  The patient does not have concerns regarding medicines.  The following changes have been made:  no change  Labs/ tests ordered today include:  No orders of the defined types were placed in this encounter.    Patient Instructions  Remote monitoring is used to monitor your Pacemaker from home. This monitoring reduces the number of office visits required to check your device to one time per year. It allows [place] to monitor the functioning of your device to ensure it is working properly. You are scheduled  for a device check from home on January 07, 2015. You may send your transmission at any time that day. If you have a wireless device, the transmission will be sent automatically. After your physician reviews your transmission, you will receive a postcard with your next transmission date.  Dr. Sallyanne Kuster recommends that you schedule a follow-up appointment in: One Year.         Mikael Spray, MD  10/07/2014 10:04 AM    Sanda Klein, MD, [facility] [phone] office [phone] pager

## 2014-10-10 LAB — MDC_IDC_ENUM_SESS_TYPE_INCLINIC
Battery Voltage: 2.99 V
Brady Statistic AP VP Percent: 32.3 %
Brady Statistic AP VS Percent: 0 %
Brady Statistic AS VP Percent: 67.68 %
Brady Statistic AS VS Percent: 0.02 %
Brady Statistic RA Percent Paced: 32.3 %
Brady Statistic RV Percent Paced: 99.98 %
Date Time Interrogation Session: 20160419134330
Lead Channel Impedance Value: 448 Ohm
Lead Channel Impedance Value: 480 Ohm
Lead Channel Sensing Intrinsic Amplitude: 3.181 mV
Lead Channel Setting Pacing Amplitude: 2 V
Lead Channel Setting Pacing Amplitude: 2.5 V
Lead Channel Setting Pacing Pulse Width: 0.4 ms
Lead Channel Setting Sensing Sensitivity: 2.1 mV
Zone Setting Detection Interval: 340 ms
Zone Setting Detection Interval: 340 ms

## 2014-10-14 ENCOUNTER — Encounter: Payer: Self-pay | Admitting: Cardiovascular Disease

## 2015-01-06 ENCOUNTER — Telehealth: Payer: Self-pay | Admitting: Cardiology

## 2015-01-06 ENCOUNTER — Ambulatory Visit (INDEPENDENT_AMBULATORY_CARE_PROVIDER_SITE_OTHER): Payer: Medicare Other | Admitting: *Deleted

## 2015-01-06 DIAGNOSIS — I442 Atrioventricular block, complete: Secondary | ICD-10-CM

## 2015-01-06 NOTE — Telephone Encounter (Signed)
Spoke with pt and reminded pt of remote transmission that is due today. Pt verbalized understanding.   

## 2015-01-07 NOTE — Progress Notes (Signed)
Remote pacemaker transmission.   

## 2015-01-14 LAB — CUP PACEART REMOTE DEVICE CHECK
Battery Voltage: 2.99 V
Brady Statistic AP VP Percent: 33.3 %
Brady Statistic AP VS Percent: 0 %
Brady Statistic AS VP Percent: 66.69 %
Brady Statistic AS VS Percent: 0.01 %
Brady Statistic RA Percent Paced: 33.3 %
Brady Statistic RV Percent Paced: 99.99 %
Date Time Interrogation Session: 20160719210605
Lead Channel Impedance Value: 472 Ohm
Lead Channel Impedance Value: 480 Ohm
Lead Channel Sensing Intrinsic Amplitude: 3.534 mV
Lead Channel Setting Pacing Amplitude: 2 V
Lead Channel Setting Pacing Amplitude: 2.5 V
Lead Channel Setting Pacing Pulse Width: 0.4 ms
Lead Channel Setting Sensing Sensitivity: 2.1 mV
Zone Setting Detection Interval: 340 ms
Zone Setting Detection Interval: 340 ms

## 2015-01-28 ENCOUNTER — Encounter: Payer: Self-pay | Admitting: Cardiology

## 2015-02-04 ENCOUNTER — Encounter: Payer: Self-pay | Admitting: Cardiovascular Disease

## 2015-04-09 ENCOUNTER — Ambulatory Visit (INDEPENDENT_AMBULATORY_CARE_PROVIDER_SITE_OTHER): Payer: Medicare Other | Admitting: *Deleted

## 2015-04-09 DIAGNOSIS — I442 Atrioventricular block, complete: Secondary | ICD-10-CM | POA: Diagnosis not present

## 2015-04-09 LAB — CUP PACEART REMOTE DEVICE CHECK
Battery Voltage: 2.97 V
Brady Statistic AP VP Percent: 40.71 %
Brady Statistic AP VS Percent: 0 %
Brady Statistic AS VP Percent: 59.28 %
Brady Statistic AS VS Percent: 0.01 %
Brady Statistic RA Percent Paced: 40.71 %
Brady Statistic RV Percent Paced: 99.99 %
Date Time Interrogation Session: 20161020112610
Implantable Lead Implant Date: 20130125
Implantable Lead Implant Date: 20130125
Implantable Lead Location: 753859
Implantable Lead Location: 753860
Implantable Lead Model: 5076
Implantable Lead Model: 5086
Lead Channel Impedance Value: 464 Ohm
Lead Channel Impedance Value: 472 Ohm
Lead Channel Sensing Intrinsic Amplitude: 4.064 mV
Lead Channel Setting Pacing Amplitude: 2 V
Lead Channel Setting Pacing Amplitude: 2.5 V
Lead Channel Setting Pacing Pulse Width: 0.4 ms
Lead Channel Setting Sensing Sensitivity: 2.1 mV

## 2015-04-09 NOTE — Progress Notes (Signed)
Remote pacemaker transmission.   

## 2015-04-13 ENCOUNTER — Encounter: Payer: Self-pay | Admitting: Cardiology

## 2015-07-09 ENCOUNTER — Telehealth: Payer: Self-pay | Admitting: Cardiology

## 2015-07-09 ENCOUNTER — Ambulatory Visit (INDEPENDENT_AMBULATORY_CARE_PROVIDER_SITE_OTHER): Payer: Medicare Other | Admitting: *Deleted

## 2015-07-09 DIAGNOSIS — I442 Atrioventricular block, complete: Secondary | ICD-10-CM

## 2015-07-09 NOTE — Telephone Encounter (Signed)
Spoke with pt and reminded pt of remote transmission that is due today. Pt verbalized understanding.   

## 2015-07-10 NOTE — Progress Notes (Signed)
Remote pacemaker transmission.   

## 2015-07-14 LAB — CUP PACEART REMOTE DEVICE CHECK
Battery Voltage: 2.97 V
Brady Statistic AP VP Percent: 30.36 %
Brady Statistic AP VS Percent: 0 %
Brady Statistic AS VP Percent: 69.64 %
Brady Statistic AS VS Percent: 0.01 %
Brady Statistic RA Percent Paced: 30.36 %
Brady Statistic RV Percent Paced: 99.99 %
Date Time Interrogation Session: 20170119220303
Implantable Lead Implant Date: 20130125
Implantable Lead Implant Date: 20130125
Implantable Lead Location: 753859
Implantable Lead Location: 753860
Implantable Lead Model: 5076
Implantable Lead Model: 5086
Lead Channel Impedance Value: 472 Ohm
Lead Channel Impedance Value: 496 Ohm
Lead Channel Sensing Intrinsic Amplitude: 4.727 mV
Lead Channel Setting Pacing Amplitude: 2 V
Lead Channel Setting Pacing Amplitude: 2.5 V
Lead Channel Setting Pacing Pulse Width: 0.4 ms
Lead Channel Setting Sensing Sensitivity: 2.1 mV

## 2015-07-17 ENCOUNTER — Encounter: Payer: Self-pay | Admitting: Cardiology

## 2015-09-25 DIAGNOSIS — Z136 Encounter for screening for cardiovascular disorders: Secondary | ICD-10-CM | POA: Diagnosis not present

## 2015-09-25 DIAGNOSIS — Z131 Encounter for screening for diabetes mellitus: Secondary | ICD-10-CM | POA: Diagnosis not present

## 2015-09-25 DIAGNOSIS — Z0000 Encounter for general adult medical examination without abnormal findings: Secondary | ICD-10-CM | POA: Diagnosis not present

## 2015-09-25 DIAGNOSIS — Z0100 Encounter for examination of eyes and vision without abnormal findings: Secondary | ICD-10-CM | POA: Diagnosis not present

## 2015-09-25 DIAGNOSIS — Z125 Encounter for screening for malignant neoplasm of prostate: Secondary | ICD-10-CM | POA: Diagnosis not present

## 2015-09-25 DIAGNOSIS — Z01118 Encounter for examination of ears and hearing with other abnormal findings: Secondary | ICD-10-CM | POA: Diagnosis not present

## 2015-09-25 DIAGNOSIS — Z1389 Encounter for screening for other disorder: Secondary | ICD-10-CM | POA: Diagnosis not present

## 2015-10-08 DIAGNOSIS — H9311 Tinnitus, right ear: Secondary | ICD-10-CM | POA: Diagnosis not present

## 2015-10-08 DIAGNOSIS — H903 Sensorineural hearing loss, bilateral: Secondary | ICD-10-CM | POA: Diagnosis not present

## 2015-10-08 DIAGNOSIS — Z87828 Personal history of other (healed) physical injury and trauma: Secondary | ICD-10-CM | POA: Diagnosis not present

## 2015-10-08 DIAGNOSIS — H6121 Impacted cerumen, right ear: Secondary | ICD-10-CM | POA: Diagnosis not present

## 2015-10-08 DIAGNOSIS — Z950 Presence of cardiac pacemaker: Secondary | ICD-10-CM | POA: Diagnosis not present

## 2015-10-26 NOTE — Progress Notes (Signed)
Patient ID: EFRAYIM FILAK, male   DOB: 11/17/1955, 60 y.o.   MRN: [id number]    Cardiology Office Note    Date:  10/27/2015   ID:  DANNIEL HAZEL, DOB June 25, 1955, MRN [id number]  PCP:  Benito Mccreedy, MD  Cardiologist:   Sanda Klein, MD   Chief Complaint  Patient presents with  . Annual Exam    pacer check, no chest pain-has some pain irritaion area pacer site    History of Present Illness:  UEL FRUIT is a 60 y.o. male who presents for follow-up for complete heart block and pacemaker check.   He feels well. He has lost weight as he is responsible for care of his wife (recovering from hysterectomy, left leg DVT, left leg paresis?) and 4 kids with autism still living at home. His activity level approaches 6 h/day per his pacemaker. He denies syncope, palpitations, angina, dyspnea, edema, neuro complaints, etc.  He presented with cardiac arrest and a prolonged intensive care unit stay in 2013. He has a dual-chamber Medtronic Revo MRI conditional device. Battery voltage is 2.9V (ERI 2.81 V). There is 39 atrial pacing and there is 100% ventricular pacing. No episodes of ventricular tachycardia have been recorded. One episode of atrial tachycardia 150 bpm, lasting 55 seconds is seen. Heart rate histogram distribution, driven mostly by sinus node, is normal.   Past Medical History  Diagnosis Date  . Shortness of breath   . Seasonal allergies   . Memory loss, short term     from fall with heart block 1/13  . 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 Dysrhythmia 1/13    bradycardia, VF, cardiac arrest/complete heart block with pacer inserted/LOV Dr Sallyanne Kuster 07/21/11 with interrogation and anesthesia guideline order on chart. Chest x ray 1/13 EPIC,, TEE and [facility]  1/13 EPIC  . Cardiac arrest ([place])   . CHB (complete heart block) ([place])   . Presence of permanent cardiac pacemaker 07/15/2011    Medtronic Revo  . Nonsustained ventricular  tachycardia ([place]) 02/07/2014    Past Surgical History  Procedure Laterality Date  . Pacemaker insertion  07/15/11    Medtronic Revo  . Cystoscopy with urethral dilatation  08/19/2011    Procedure: CYSTOSCOPY WITH URETHRAL DILATATION;  Surgeon: Dutch Gray, MD;  Location: [facility];  Service: Urology;  Laterality: N/A;  Balloon Dilation of Urethral Stricture, retro-urethrogram, Suprapubic change   . Cardiac catheterization  07/09/2011    Normal coronaries  . Left heart catheterization with coronary angiogram N/A 07/09/2011    Procedure: LEFT HEART CATHETERIZATION WITH CORONARY ANGIOGRAM;  Surgeon: Jettie Booze, MD;  Location: [facility] CATH LAB;  Service: Cardiovascular;  Laterality: N/A;  . Temporary pacemaker insertion Right 07/09/2011    Procedure: TEMPORARY PACEMAKER INSERTION;  Surgeon: Jettie Booze, MD;  Location: [facility] CATH LAB;  Service: Cardiovascular;  Laterality: Right;  . Permanent pacemaker insertion Left 07/15/2011    Procedure: PERMANENT PACEMAKER INSERTION;  Surgeon: Sanda Klein, MD;  Location: [facility] CATH LAB;  Service: Cardiovascular;  Laterality: Left;    Current Medications: Outpatient Prescriptions Prior to Visit  Medication Sig Dispense Refill  . acetaminophen (TYLENOL) 500 MG tablet Take 1,000 mg by mouth every 6 (six) hours as needed. Pain     . Multiple Vitamin (MULITIVITAMIN WITH MINERALS) TABS Take 1 tablet by mouth as needed.      Facility-Administered Medications Prior to Visit  Medication Dose Route Frequency Provider Last Rate Last Dose  .  fluconazole (DIFLUCAN) IVPB 100 mg  100 mg Intravenous Q24H Raynelle Bring, MD         Allergies:   Review of patient's allergies indicates no known allergies.   Social History   Social History  . Marital Status: Married    Spouse Name: N/A  . Number of Children: N/A  . Years of Education: N/A   Social History Main Topics  . Smoking status: Former Smoker    Types: Cigarettes    Quit date: 06/20/1989  . Smokeless  tobacco: None  . Alcohol Use: No  . Drug Use: No  . Sexual Activity: Not Asked   Other Topics Concern  . None   Social History Narrative     Family History:  The patient's family history includes COPD in his father; Coronary artery disease in his brother and mother.   ROS:   Please see the history of present illness.    ROS All other systems reviewed and are negative.   PHYSICAL EXAM:   VS:  BP 114/67 mmHg  Pulse 78  Ht 5\' 10"  (1.778 m)  Wt 73.993 kg (163 lb 2 oz)  BMI 23.41 kg/m2   GEN: Well nourished, well developed, in no acute distress HEENT: normal Neck: no JVD, carotid bruits, or masses Cardiac:  Paradoxically split S2, RRR; no murmurs, rubs, or gallops,no edema , healthy left subclavian pacemaker site Respiratory:  clear to auscultation bilaterally, normal work of breathing GI: soft, nontender, nondistended, + BS MS: no deformity or atrophy Skin: warm and dry, no rash Neuro:  Alert and Oriented x 3, Strength and sensation are intact Psych: euthymic mood, full affect  Wt Readings from Last 3 Encounters:  10/27/15 73.993 kg (163 lb 2 oz)  10/07/14 82.328 kg (181 lb 8 oz)  02/07/14 80.74 kg (178 lb)      Studies/Labs Reviewed:   EKG:  EKG is ordered today.  The ekg ordered today demonstrates A sensed V paced rhythm   ASSESSMENT:    1. CHB (complete heart block) ([place])   2. Nonsustained ventricular tachycardia ([facility])   3. Paroxysmal atrial tachycardia ([place])   4. Presence of permanent cardiac pacemaker      PLAN:  In order of problems listed above:  1. CHB: pacemaker dependent  2. NSVT: none recorded in several months 3. PAT: unlikely to be clinically significant, brief and infrequent 4. PPM: normal device function; Continue remote CareLink monitoring every 3 months and a office visit in one year.     Medication Adjustments/Labs and Tests Ordered: Current medicines are reviewed at length with the patient today.  Concerns regarding medicines are  outlined above.  Medication changes, Labs and Tests ordered today are listed in the Patient Instructions below. Patient Instructions  Medication Instructions: Dr Sallyanne Kuster recommends that you continue on your current medications as directed. Please refer to the Current Medication list given to you today.  Labwork: NONE ORDERED  Testing/Procedures: 1. Remote Pacemaker Download - Remote monitoring is used to monitor your Pacemaker of ICD from home. This monitoring reduces the number of office visits required to check your device to one time per year. It allows [place] to keep an eye on the functioning of your device to ensure it is working properly. You are scheduled for a device check from home on Thursday, August 10th, 2017. You may send your transmission at any time that day. If you have a wireless device, the transmission will be sent automatically. After your physician reviews your transmission, you will  receive a postcard with your next transmission date.  Follow-up: Dr Sallyanne Kuster recommends that you schedule a follow-up appointment in 1 year with a pacemaker check. You will receive a reminder letter in the mail two months in advance. If you don't receive a letter, please call our office to schedule the follow-up appointment.  If you need a refill on your cardiac medications before your next appointment, please call your pharmacy.     Mikael Spray, MD  10/27/2015 3:07 PM    [facility] [address], [place], [place]  [address] Phone: [phone]; Fax: [phone]

## 2015-10-27 ENCOUNTER — Ambulatory Visit (INDEPENDENT_AMBULATORY_CARE_PROVIDER_SITE_OTHER): Payer: Medicare Other | Admitting: Cardiovascular Disease

## 2015-10-27 ENCOUNTER — Encounter: Payer: Self-pay | Admitting: Cardiovascular Disease

## 2015-10-27 VITALS — BP 114/67 | HR 78 | Ht 70.0 in | Wt 163.1 lb

## 2015-10-27 DIAGNOSIS — I471 Supraventricular tachycardia: Secondary | ICD-10-CM

## 2015-10-27 DIAGNOSIS — I472 Ventricular tachycardia: Secondary | ICD-10-CM | POA: Diagnosis not present

## 2015-10-27 DIAGNOSIS — Z950 Presence of cardiac pacemaker: Secondary | ICD-10-CM

## 2015-10-27 DIAGNOSIS — I4719 Other supraventricular tachycardia: Secondary | ICD-10-CM | POA: Insufficient documentation

## 2015-10-27 DIAGNOSIS — I4729 Other ventricular tachycardia: Secondary | ICD-10-CM

## 2015-10-27 DIAGNOSIS — I442 Atrioventricular block, complete: Secondary | ICD-10-CM | POA: Diagnosis not present

## 2015-10-27 NOTE — Patient Instructions (Signed)
Medication Instructions: Dr Sallyanne Kuster recommends that you continue on your current medications as directed. Please refer to the Current Medication list given to you today.  Labwork: NONE ORDERED  Testing/Procedures: 1. Remote Pacemaker Download - Remote monitoring is used to monitor your Pacemaker of ICD from home. This monitoring reduces the number of office visits required to check your device to one time per year. It allows [place] to keep an eye on the functioning of your device to ensure it is working properly. You are scheduled for a device check from home on Thursday, August 10th, 2017. You may send your transmission at any time that day. If you have a wireless device, the transmission will be sent automatically. After your physician reviews your transmission, you will receive a postcard with your next transmission date.  Follow-up: Dr Sallyanne Kuster recommends that you schedule a follow-up appointment in 1 year with a pacemaker check. You will receive a reminder letter in the mail two months in advance. If you don't receive a letter, please call our office to schedule the follow-up appointment.  If you need a refill on your cardiac medications before your next appointment, please call your pharmacy.

## 2015-11-07 LAB — CUP PACEART INCLINIC DEVICE CHECK
Battery Voltage: 2.97 V
Brady Statistic AP VP Percent: 34.93 %
Brady Statistic AP VS Percent: 0 %
Brady Statistic AS VP Percent: 65.06 %
Brady Statistic AS VS Percent: 0.01 %
Brady Statistic RA Percent Paced: 34.93 %
Brady Statistic RV Percent Paced: 99.99 %
Date Time Interrogation Session: 20170509133615
Implantable Lead Implant Date: 20130125
Implantable Lead Implant Date: 20130125
Implantable Lead Location: 753859
Implantable Lead Location: 753860
Implantable Lead Model: 5076
Implantable Lead Model: 5086
Lead Channel Impedance Value: 440 Ohm
Lead Channel Impedance Value: 456 Ohm
Lead Channel Sensing Intrinsic Amplitude: 4.241 mV
Lead Channel Setting Pacing Amplitude: 2 V
Lead Channel Setting Pacing Amplitude: 2.5 V
Lead Channel Setting Pacing Pulse Width: 0.4 ms
Lead Channel Setting Sensing Sensitivity: 2.1 mV

## 2015-11-10 DIAGNOSIS — R05 Cough: Secondary | ICD-10-CM | POA: Diagnosis not present

## 2015-11-10 DIAGNOSIS — Z0000 Encounter for general adult medical examination without abnormal findings: Secondary | ICD-10-CM | POA: Diagnosis not present

## 2015-11-10 DIAGNOSIS — I43 Cardiomyopathy in diseases classified elsewhere: Secondary | ICD-10-CM | POA: Diagnosis not present

## 2015-11-10 DIAGNOSIS — E559 Vitamin D deficiency, unspecified: Secondary | ICD-10-CM | POA: Diagnosis not present

## 2015-11-11 ENCOUNTER — Encounter: Payer: Self-pay | Admitting: Cardiovascular Disease

## 2016-01-28 ENCOUNTER — Encounter: Payer: Medicare Other | Admitting: *Deleted

## 2016-01-28 ENCOUNTER — Telehealth: Payer: Self-pay | Admitting: Cardiology

## 2016-01-28 NOTE — Telephone Encounter (Signed)
LMOVM reminding pt to send remote transmission.   

## 2016-03-03 DIAGNOSIS — Z87891 Personal history of nicotine dependence: Secondary | ICD-10-CM | POA: Diagnosis not present

## 2016-03-03 DIAGNOSIS — R7303 Prediabetes: Secondary | ICD-10-CM | POA: Diagnosis not present

## 2016-03-03 DIAGNOSIS — E559 Vitamin D deficiency, unspecified: Secondary | ICD-10-CM | POA: Diagnosis not present

## 2016-05-31 DIAGNOSIS — M722 Plantar fascial fibromatosis: Secondary | ICD-10-CM | POA: Diagnosis not present

## 2016-05-31 DIAGNOSIS — G629 Polyneuropathy, unspecified: Secondary | ICD-10-CM | POA: Diagnosis not present

## 2016-05-31 DIAGNOSIS — E559 Vitamin D deficiency, unspecified: Secondary | ICD-10-CM | POA: Diagnosis not present

## 2016-05-31 DIAGNOSIS — Z131 Encounter for screening for diabetes mellitus: Secondary | ICD-10-CM | POA: Diagnosis not present

## 2016-05-31 DIAGNOSIS — J309 Allergic rhinitis, unspecified: Secondary | ICD-10-CM | POA: Diagnosis not present

## 2016-05-31 DIAGNOSIS — I43 Cardiomyopathy in diseases classified elsewhere: Secondary | ICD-10-CM | POA: Diagnosis not present

## 2016-05-31 DIAGNOSIS — R7303 Prediabetes: Secondary | ICD-10-CM | POA: Diagnosis not present

## 2016-06-09 DIAGNOSIS — M2041 Other hammer toe(s) (acquired), right foot: Secondary | ICD-10-CM | POA: Diagnosis not present

## 2016-06-09 DIAGNOSIS — M722 Plantar fascial fibromatosis: Secondary | ICD-10-CM | POA: Diagnosis not present

## 2016-06-09 DIAGNOSIS — M2042 Other hammer toe(s) (acquired), left foot: Secondary | ICD-10-CM | POA: Diagnosis not present

## 2016-06-09 DIAGNOSIS — M71571 Other bursitis, not elsewhere classified, right ankle and foot: Secondary | ICD-10-CM | POA: Diagnosis not present

## 2016-06-09 DIAGNOSIS — M79671 Pain in right foot: Secondary | ICD-10-CM | POA: Diagnosis not present

## 2016-06-09 DIAGNOSIS — M79672 Pain in left foot: Secondary | ICD-10-CM | POA: Diagnosis not present

## 2016-06-09 DIAGNOSIS — G5762 Lesion of plantar nerve, left lower limb: Secondary | ICD-10-CM | POA: Diagnosis not present

## 2016-06-09 DIAGNOSIS — M71572 Other bursitis, not elsewhere classified, left ankle and foot: Secondary | ICD-10-CM | POA: Diagnosis not present

## 2016-07-08 ENCOUNTER — Ambulatory Visit (INDEPENDENT_AMBULATORY_CARE_PROVIDER_SITE_OTHER): Payer: Medicare Other | Admitting: *Deleted

## 2016-07-08 DIAGNOSIS — I442 Atrioventricular block, complete: Secondary | ICD-10-CM | POA: Diagnosis not present

## 2016-07-13 ENCOUNTER — Encounter: Payer: Self-pay | Admitting: Cardiology

## 2016-07-13 NOTE — Progress Notes (Signed)
Remote pacemaker transmission.   

## 2016-07-14 LAB — CUP PACEART REMOTE DEVICE CHECK
Battery Voltage: 2.96 V
Brady Statistic AP VP Percent: 33.65 %
Brady Statistic AP VS Percent: 0 %
Brady Statistic AS VP Percent: 66.23 %
Brady Statistic AS VS Percent: 0.11 %
Brady Statistic RA Percent Paced: 33.57 %
Brady Statistic RV Percent Paced: 99.74 %
Date Time Interrogation Session: 20180119224445
Implantable Lead Implant Date: 20130125
Implantable Lead Implant Date: 20130125
Implantable Lead Location: 753859
Implantable Lead Location: 753860
Implantable Lead Model: 5076
Implantable Pulse Generator Implant Date: 20130125
Lead Channel Impedance Value: 432 Ohm
Lead Channel Impedance Value: 464 Ohm
Lead Channel Sensing Intrinsic Amplitude: 4.373 mV
Lead Channel Setting Pacing Amplitude: 2 V
Lead Channel Setting Pacing Amplitude: 2.5 V
Lead Channel Setting Pacing Pulse Width: 0.4 ms
Lead Channel Setting Sensing Sensitivity: 2.1 mV

## 2016-10-12 ENCOUNTER — Telehealth: Payer: Self-pay | Admitting: Cardiology

## 2016-10-12 ENCOUNTER — Encounter: Payer: Medicare Other | Admitting: *Deleted

## 2016-10-12 NOTE — Telephone Encounter (Signed)
LMOVM reminding pt to send remote transmission.   

## 2016-10-14 ENCOUNTER — Encounter: Payer: Self-pay | Admitting: Cardiology

## 2016-11-08 ENCOUNTER — Encounter: Payer: Medicare Other | Admitting: Cardiovascular Disease

## 2016-11-09 ENCOUNTER — Encounter: Payer: Self-pay | Admitting: *Deleted

## 2017-01-24 DIAGNOSIS — Z87891 Personal history of nicotine dependence: Secondary | ICD-10-CM | POA: Diagnosis not present

## 2017-01-24 DIAGNOSIS — G629 Polyneuropathy, unspecified: Secondary | ICD-10-CM | POA: Diagnosis not present

## 2017-01-24 DIAGNOSIS — I429 Cardiomyopathy, unspecified: Secondary | ICD-10-CM | POA: Diagnosis not present

## 2017-01-24 DIAGNOSIS — R7303 Prediabetes: Secondary | ICD-10-CM | POA: Diagnosis not present

## 2017-01-24 DIAGNOSIS — Z131 Encounter for screening for diabetes mellitus: Secondary | ICD-10-CM | POA: Diagnosis not present

## 2017-01-24 DIAGNOSIS — Z0000 Encounter for general adult medical examination without abnormal findings: Secondary | ICD-10-CM | POA: Diagnosis not present

## 2017-01-24 DIAGNOSIS — G479 Sleep disorder, unspecified: Secondary | ICD-10-CM | POA: Diagnosis not present

## 2017-01-24 DIAGNOSIS — Z0110 Encounter for examination of ears and hearing without abnormal findings: Secondary | ICD-10-CM | POA: Diagnosis not present

## 2017-01-24 DIAGNOSIS — Z5181 Encounter for therapeutic drug level monitoring: Secondary | ICD-10-CM | POA: Diagnosis not present

## 2017-01-24 DIAGNOSIS — E559 Vitamin D deficiency, unspecified: Secondary | ICD-10-CM | POA: Diagnosis not present

## 2017-01-24 DIAGNOSIS — J309 Allergic rhinitis, unspecified: Secondary | ICD-10-CM | POA: Diagnosis not present

## 2017-02-03 ENCOUNTER — Encounter: Payer: Self-pay | Admitting: Cardiovascular Disease

## 2017-02-03 ENCOUNTER — Ambulatory Visit (INDEPENDENT_AMBULATORY_CARE_PROVIDER_SITE_OTHER): Payer: Medicare Other | Admitting: Cardiovascular Disease

## 2017-02-03 VITALS — BP 110/70 | HR 78 | Ht 70.0 in | Wt 158.0 lb

## 2017-02-03 DIAGNOSIS — Z950 Presence of cardiac pacemaker: Secondary | ICD-10-CM | POA: Diagnosis not present

## 2017-02-03 DIAGNOSIS — I442 Atrioventricular block, complete: Secondary | ICD-10-CM

## 2017-02-03 DIAGNOSIS — I471 Supraventricular tachycardia: Secondary | ICD-10-CM | POA: Diagnosis not present

## 2017-02-03 NOTE — Patient Instructions (Signed)

## 2017-02-03 NOTE — Progress Notes (Signed)
Patient ID: Casey Richard, male   DOB: 17-Dec-1955, 61 y.o.   MRN: [id number]    Cardiology Office Note    Date:  02/03/2017   ID:  Casey Richard, DOB 1956/01/08, MRN [id number]  PCP:  Benito Mccreedy, MD  Cardiologist:   Sanda Klein, MD   Chief Complaint  Patient presents with  . Follow-up    1 year.  Marland Kitchen Headache  . Shortness of Breath    Only outside.    History of Present Illness:  Casey Richard is a 61 y.o. male who presents for follow-up for complete heart block and pacemaker check.   Physically, Casey Richard is doing well. Just over 3 months have passed since his wife's demise from widespread abdominal cancer. Psychologically he is not doing as well and finds it hard to pull himself together. He had initially planned to move out of town, but has changed his mind. His 4 children ages 86 through 38 are still living at home with him, they all have autism with different levels of functional disability. He is worried about taking care of them and about their financial future. His not have any family in town. He has lost 5 more pounds in the last couple of months.   He has complete heart block without any detectable R waves. His pacemaker is functioning normally. Battery voltage is 2.95 V (ERI 2.8106). He has 100% ventricular pacing and also has 30% atrial pacing. Activity level remains high at roughly 5 hours per day and he has not had any episodes of ventricular tachycardia. He had one episode of paroxysmal atrial tachycardia at about 100 and permanent, lasting for only 77 seconds.  He presented with cardiac arrest and a prolonged intensive care unit stay in 2013. He has a dual-chamber Medtronic Revo MRI conditional device. Following his cardiac arrest he has had issues with short-term memory loss.  Past Medical History:  Diagnosis Date  . Cardiac arrest ([place])   . CHB (complete heart block) ([place])   . 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  Dysrhythmia 1/13   bradycardia, VF, cardiac arrest/complete heart block with pacer inserted/LOV Dr Sallyanne Kuster 07/21/11 with interrogation and anesthesia guideline order on chart. Chest x ray 1/13 EPIC,, TEE and [facility]  1/13 EPIC  . Memory loss, short term    from fall with heart block 1/13  . Nonsustained ventricular tachycardia ([place]) 02/07/2014  . Presence of permanent cardiac pacemaker 07/15/2011   Medtronic Revo  . Seasonal allergies   . Shortness of breath     Past Surgical History:  Procedure Laterality Date  . CARDIAC CATHETERIZATION  07/09/2011   Normal coronaries  . CYSTOSCOPY WITH URETHRAL DILATATION  08/19/2011   Procedure: CYSTOSCOPY WITH URETHRAL DILATATION;  Surgeon: Dutch Gray, MD;  Location: [facility];  Service: Urology;  Laterality: N/A;  Balloon Dilation of Urethral Stricture, retro-urethrogram, Suprapubic change   . LEFT HEART CATHETERIZATION WITH CORONARY ANGIOGRAM N/A 07/09/2011   Procedure: LEFT HEART CATHETERIZATION WITH CORONARY ANGIOGRAM;  Surgeon: Jettie Booze, MD;  Location: [facility] CATH LAB;  Service: Cardiovascular;  Laterality: N/A;  . PACEMAKER INSERTION  07/15/11   Medtronic Revo  . PERMANENT PACEMAKER INSERTION Left 07/15/2011   Procedure: PERMANENT PACEMAKER INSERTION;  Surgeon: Sanda Klein, MD;  Location: [place] CATH LAB;  Service: Cardiovascular;  Laterality: Left;  . TEMPORARY PACEMAKER INSERTION Right 07/09/2011   Procedure: TEMPORARY PACEMAKER INSERTION;  Surgeon: Jettie Booze, MD;  Location: [facility]  CATH LAB;  Service: Cardiovascular;  Laterality: Right;    Current Medications: Outpatient Medications Prior to Visit  Medication Sig Dispense Refill  . acetaminophen (TYLENOL) 500 MG tablet Take 1,000 mg by mouth every 6 (six) hours as needed. Pain     . Cholecalciferol (HM VITAMIN D3) 4000 units CAPS Take 1 capsule by mouth daily.    . cyanocobalamin 2000 MCG tablet Take 2,000 mcg by mouth daily.    . Multiple Vitamin (MULITIVITAMIN WITH MINERALS) TABS Take 1  tablet by mouth as needed.      Facility-Administered Medications Prior to Visit  Medication Dose Route Frequency Provider Last Rate Last Dose  . fluconazole (DIFLUCAN) IVPB 100 mg  100 mg Intravenous Q24H Raynelle Bring, MD         Allergies:   Patient has no known allergies.   Social History   Social History  . Marital status: Married    Spouse name: N/A  . Number of children: N/A  . Years of education: N/A   Social History Main Topics  . Smoking status: Former Smoker    Types: Cigarettes    Quit date: 06/20/1989  . Smokeless tobacco: Never Used  . Alcohol use No  . Drug use: No  . Sexual activity: Not Asked   Other Topics Concern  . None   Social History Narrative  . None     Family History:  The patient's family history includes COPD in his father; Coronary artery disease in his brother and mother.   ROS:   Please see the history of present illness.    Review of Systems  Eyes: Double vision: .mcrpexn.   All other systems reviewed and are negative.   PHYSICAL EXAM:   VS:  BP 110/70   Pulse 78   Ht 5\' 10"  (1.778 m)   Wt 158 lb (71.7 kg)   BMI 22.67 kg/m    GEN: Well nourished, well developed, in no acute distress   General: Alert, oriented x3, no distress Head: no evidence of trauma, PERRL, EOMI, no exophtalmos or lid lag, no myxedema, no xanthelasma; normal ears, nose and oropharynx Neck: normal jugular venous pulsations and no hepatojugular reflux; brisk carotid pulses without delay and no carotid bruits Chest: clear to auscultation, no signs of consolidation by percussion or palpation, normal fremitus, symmetrical and full respiratory excursions Cardiovascular: normal position and quality of the apical impulse, regular rhythm, no murmurs, rubs or gallops. Healthy right subclavian pacemaker site. Paradoxically split S2 Abdomen: no tenderness or distention, no masses by palpation, no abnormal pulsatility or arterial bruits, normal bowel sounds, no  hepatosplenomegaly Extremities: no clubbing, cyanosis or edema; 2+ radial, ulnar and brachial pulses bilaterally; 2+ right femoral, posterior tibial and dorsalis pedis pulses; 2+ left femoral, posterior tibial and dorsalis pedis pulses; no subclavian or femoral bruits Neurological: grossly nonfocal Psych: euthymic mood, full affect. He is much more subdued than usual, but does not appear truly depressed.  Wt Readings from Last 3 Encounters:  02/03/17 158 lb (71.7 kg)  10/27/15 163 lb 2 oz (74 kg)  10/07/14 181 lb 8 oz (82.3 kg)      Studies/Labs Reviewed:   EKG:  EKG is ordered today.  The ekg ordered today demonstrates Atrial sensed, ventricular paced rhythm   ASSESSMENT:    1. CHB (complete heart block) (HCC)   2. Paroxysmal atrial tachycardia ([place])   3. Presence of permanent cardiac pacemaker      PLAN:  In order of problems listed above:  1. CHB: pacemaker dependent  2. PAT: The episodes of atrial arrhythmia are extremely rare and brief, clinically not significant 3. PPM: normal device function; Continue remote CareLink monitoring every 3 months and a office visit in one year.     Medication Adjustments/Labs and Tests Ordered: Current medicines are reviewed at length with the patient today.  Concerns regarding medicines are outlined above.  Medication changes, Labs and Tests ordered today are listed in the Patient Instructions below. Patient Instructions  Dr Sallyanne Kuster recommends that you continue on your current medications as directed. Please refer to the Current Medication list given to you today.  Remote monitoring is used to monitor your Pacemaker or ICD from home. This monitoring reduces the number of office visits required to check your device to one time per year. It allows [place] to keep an eye on the functioning of your device to ensure it is working properly. You are scheduled for a device check from home on Friday, November 16th, 2018. You may send your transmission  at any time that day. If you have a wireless device, the transmission will be sent automatically. After your physician reviews your transmission, you will receive a notification with your next transmission date.  Dr Sallyanne Kuster recommends that you schedule a follow-up appointment in 12 months with a pacemaker check. You will receive a reminder letter in the mail two months in advance. If you don't receive a letter, please call our office to schedule the follow-up appointment.  If you need a refill on your cardiac medications before your next appointment, please call your pharmacy.    Signed, Sanda Klein, MD  02/03/2017 1:15 PM    [facility] [address], [place], [place]  [address] Phone: [phone]; Fax: [phone]

## 2017-03-21 DIAGNOSIS — Z0000 Encounter for general adult medical examination without abnormal findings: Secondary | ICD-10-CM | POA: Diagnosis not present

## 2017-03-21 DIAGNOSIS — G479 Sleep disorder, unspecified: Secondary | ICD-10-CM | POA: Diagnosis not present

## 2017-03-21 DIAGNOSIS — E559 Vitamin D deficiency, unspecified: Secondary | ICD-10-CM | POA: Diagnosis not present

## 2017-04-05 DIAGNOSIS — E559 Vitamin D deficiency, unspecified: Secondary | ICD-10-CM | POA: Diagnosis not present

## 2017-04-05 DIAGNOSIS — J309 Allergic rhinitis, unspecified: Secondary | ICD-10-CM | POA: Diagnosis not present

## 2017-04-05 DIAGNOSIS — K0889 Other specified disorders of teeth and supporting structures: Secondary | ICD-10-CM | POA: Diagnosis not present

## 2017-04-05 DIAGNOSIS — G629 Polyneuropathy, unspecified: Secondary | ICD-10-CM | POA: Diagnosis not present

## 2017-04-05 DIAGNOSIS — G479 Sleep disorder, unspecified: Secondary | ICD-10-CM | POA: Diagnosis not present

## 2017-04-11 DIAGNOSIS — Z9189 Other specified personal risk factors, not elsewhere classified: Secondary | ICD-10-CM | POA: Diagnosis not present

## 2017-04-11 DIAGNOSIS — H2513 Age-related nuclear cataract, bilateral: Secondary | ICD-10-CM | POA: Diagnosis not present

## 2017-04-11 DIAGNOSIS — H538 Other visual disturbances: Secondary | ICD-10-CM | POA: Diagnosis not present

## 2017-04-27 DIAGNOSIS — G629 Polyneuropathy, unspecified: Secondary | ICD-10-CM | POA: Diagnosis not present

## 2017-04-27 DIAGNOSIS — R200 Anesthesia of skin: Secondary | ICD-10-CM | POA: Diagnosis not present

## 2017-04-27 DIAGNOSIS — G479 Sleep disorder, unspecified: Secondary | ICD-10-CM | POA: Diagnosis not present

## 2017-04-27 DIAGNOSIS — E559 Vitamin D deficiency, unspecified: Secondary | ICD-10-CM | POA: Diagnosis not present

## 2017-04-27 DIAGNOSIS — J309 Allergic rhinitis, unspecified: Secondary | ICD-10-CM | POA: Diagnosis not present

## 2017-05-05 ENCOUNTER — Telehealth: Payer: Self-pay | Admitting: Cardiology

## 2017-05-05 ENCOUNTER — Ambulatory Visit (INDEPENDENT_AMBULATORY_CARE_PROVIDER_SITE_OTHER): Payer: Medicare Other | Admitting: *Deleted

## 2017-05-05 DIAGNOSIS — I442 Atrioventricular block, complete: Secondary | ICD-10-CM | POA: Diagnosis not present

## 2017-05-05 NOTE — Telephone Encounter (Signed)
Spoke with pt and reminded pt of remote transmission that is due today. Pt verbalized understanding.   

## 2017-05-08 NOTE — Progress Notes (Signed)
Remote pacemaker transmission.   

## 2017-05-10 ENCOUNTER — Encounter: Payer: Self-pay | Admitting: Cardiology

## 2017-05-22 LAB — CUP PACEART REMOTE DEVICE CHECK
Battery Voltage: 2.93 V
Brady Statistic AP VP Percent: 36.02 %
Brady Statistic AP VS Percent: 0 %
Brady Statistic AS VP Percent: 63.9 %
Brady Statistic AS VS Percent: 0.08 %
Brady Statistic RA Percent Paced: 35.9 %
Brady Statistic RV Percent Paced: 99.73 %
Date Time Interrogation Session: 20181116173059
Implantable Lead Implant Date: 20130125
Implantable Lead Implant Date: 20130125
Implantable Lead Location: 753859
Implantable Lead Location: 753860
Implantable Lead Model: 5076
Implantable Pulse Generator Implant Date: 20130125
Lead Channel Impedance Value: 440 Ohm
Lead Channel Impedance Value: 456 Ohm
Lead Channel Sensing Intrinsic Amplitude: 3.976 mV
Lead Channel Setting Pacing Amplitude: 2 V
Lead Channel Setting Pacing Amplitude: 2.5 V
Lead Channel Setting Pacing Pulse Width: 0.4 ms
Lead Channel Setting Sensing Sensitivity: 2.1 mV

## 2017-06-29 DIAGNOSIS — Z87891 Personal history of nicotine dependence: Secondary | ICD-10-CM | POA: Diagnosis not present

## 2017-06-29 DIAGNOSIS — G629 Polyneuropathy, unspecified: Secondary | ICD-10-CM | POA: Diagnosis not present

## 2017-06-29 DIAGNOSIS — E559 Vitamin D deficiency, unspecified: Secondary | ICD-10-CM | POA: Diagnosis not present

## 2017-06-29 DIAGNOSIS — G479 Sleep disorder, unspecified: Secondary | ICD-10-CM | POA: Diagnosis not present

## 2017-06-29 DIAGNOSIS — J309 Allergic rhinitis, unspecified: Secondary | ICD-10-CM | POA: Diagnosis not present

## 2017-08-07 ENCOUNTER — Telehealth: Payer: Self-pay | Admitting: Cardiology

## 2017-08-07 ENCOUNTER — Ambulatory Visit (INDEPENDENT_AMBULATORY_CARE_PROVIDER_SITE_OTHER): Payer: Medicare Other | Admitting: *Deleted

## 2017-08-07 DIAGNOSIS — I442 Atrioventricular block, complete: Secondary | ICD-10-CM | POA: Diagnosis not present

## 2017-08-07 NOTE — Telephone Encounter (Signed)
Spoke with pt and reminded pt of remote transmission that is due today. Pt verbalized understanding.   

## 2017-08-08 LAB — CUP PACEART INCLINIC DEVICE CHECK
Battery Voltage: 2.95 V
Brady Statistic AP VP Percent: 30.59 %
Brady Statistic AP VS Percent: 0 %
Brady Statistic AS VP Percent: 69.23 %
Brady Statistic AS VS Percent: 0.18 %
Brady Statistic RA Percent Paced: 30.49 %
Brady Statistic RV Percent Paced: 99.64 %
Date Time Interrogation Session: 20180817154954
Implantable Lead Implant Date: 20130125
Implantable Lead Implant Date: 20130125
Implantable Lead Location: 753859
Implantable Lead Location: 753860
Implantable Lead Model: 5076
Implantable Pulse Generator Implant Date: 20130125
Lead Channel Impedance Value: 432 Ohm
Lead Channel Impedance Value: 456 Ohm
Lead Channel Sensing Intrinsic Amplitude: 3.932 mV
Lead Channel Setting Pacing Amplitude: 2 V
Lead Channel Setting Pacing Amplitude: 2.5 V
Lead Channel Setting Pacing Pulse Width: 0.4 ms
Lead Channel Setting Sensing Sensitivity: 2.1 mV

## 2017-08-09 ENCOUNTER — Encounter: Payer: Self-pay | Admitting: Cardiology

## 2017-08-09 NOTE — Progress Notes (Signed)
Remote pacemaker transmission.   

## 2017-08-11 LAB — CUP PACEART REMOTE DEVICE CHECK
Battery Voltage: 2.93 V
Brady Statistic AP VP Percent: 31.15 %
Brady Statistic AP VS Percent: 0 %
Brady Statistic AS VP Percent: 68.41 %
Brady Statistic AS VS Percent: 0.44 %
Brady Statistic RA Percent Paced: 30.98 %
Brady Statistic RV Percent Paced: 99.27 %
Date Time Interrogation Session: 20190219210749
Implantable Lead Implant Date: 20130125
Implantable Lead Implant Date: 20130125
Implantable Lead Location: 753859
Implantable Lead Location: 753860
Implantable Lead Model: 5076
Implantable Pulse Generator Implant Date: 20130125
Lead Channel Impedance Value: 416 Ohm
Lead Channel Impedance Value: 448 Ohm
Lead Channel Sensing Intrinsic Amplitude: 3.667 mV
Lead Channel Setting Pacing Amplitude: 2 V
Lead Channel Setting Pacing Amplitude: 2.5 V
Lead Channel Setting Pacing Pulse Width: 0.4 ms
Lead Channel Setting Sensing Sensitivity: 2.1 mV

## 2017-11-06 ENCOUNTER — Encounter: Payer: Medicare Other | Admitting: *Deleted

## 2017-11-06 ENCOUNTER — Telehealth: Payer: Self-pay | Admitting: Cardiology

## 2017-11-06 NOTE — Telephone Encounter (Signed)
Attempted to confirm remote transmission with pt. No answer and was unable to leave a message.   

## 2017-11-09 ENCOUNTER — Encounter: Payer: Self-pay | Admitting: Cardiology

## 2017-11-09 NOTE — Progress Notes (Signed)
Letter  

## 2017-12-05 ENCOUNTER — Encounter: Payer: Self-pay | Admitting: Cardiology

## 2017-12-12 DIAGNOSIS — G629 Polyneuropathy, unspecified: Secondary | ICD-10-CM | POA: Diagnosis not present

## 2017-12-12 DIAGNOSIS — R7303 Prediabetes: Secondary | ICD-10-CM | POA: Diagnosis not present

## 2017-12-12 DIAGNOSIS — J309 Allergic rhinitis, unspecified: Secondary | ICD-10-CM | POA: Diagnosis not present

## 2017-12-12 DIAGNOSIS — E559 Vitamin D deficiency, unspecified: Secondary | ICD-10-CM | POA: Diagnosis not present

## 2017-12-12 DIAGNOSIS — Z87891 Personal history of nicotine dependence: Secondary | ICD-10-CM | POA: Diagnosis not present

## 2017-12-12 DIAGNOSIS — G479 Sleep disorder, unspecified: Secondary | ICD-10-CM | POA: Diagnosis not present

## 2017-12-28 DIAGNOSIS — Z01118 Encounter for examination of ears and hearing with other abnormal findings: Secondary | ICD-10-CM | POA: Diagnosis not present

## 2017-12-28 DIAGNOSIS — Z131 Encounter for screening for diabetes mellitus: Secondary | ICD-10-CM | POA: Diagnosis not present

## 2017-12-28 DIAGNOSIS — H538 Other visual disturbances: Secondary | ICD-10-CM | POA: Diagnosis not present

## 2017-12-28 DIAGNOSIS — Z136 Encounter for screening for cardiovascular disorders: Secondary | ICD-10-CM | POA: Diagnosis not present

## 2017-12-28 DIAGNOSIS — R7303 Prediabetes: Secondary | ICD-10-CM | POA: Diagnosis not present

## 2017-12-28 DIAGNOSIS — Z0000 Encounter for general adult medical examination without abnormal findings: Secondary | ICD-10-CM | POA: Diagnosis not present

## 2017-12-28 DIAGNOSIS — R0989 Other specified symptoms and signs involving the circulatory and respiratory systems: Secondary | ICD-10-CM | POA: Diagnosis not present

## 2017-12-28 DIAGNOSIS — G479 Sleep disorder, unspecified: Secondary | ICD-10-CM | POA: Diagnosis not present

## 2017-12-29 DIAGNOSIS — Z1211 Encounter for screening for malignant neoplasm of colon: Secondary | ICD-10-CM | POA: Diagnosis not present

## 2017-12-29 DIAGNOSIS — Z1212 Encounter for screening for malignant neoplasm of rectum: Secondary | ICD-10-CM | POA: Diagnosis not present

## 2018-01-01 ENCOUNTER — Ambulatory Visit (INDEPENDENT_AMBULATORY_CARE_PROVIDER_SITE_OTHER): Payer: Medicare Other | Admitting: *Deleted

## 2018-01-01 DIAGNOSIS — I442 Atrioventricular block, complete: Secondary | ICD-10-CM | POA: Diagnosis not present

## 2018-01-02 LAB — CUP PACEART REMOTE DEVICE CHECK
Battery Voltage: 2.92 V
Brady Statistic AP VP Percent: 19.71 %
Brady Statistic AP VS Percent: 0 %
Brady Statistic AS VP Percent: 79.48 %
Brady Statistic AS VS Percent: 0.8 %
Brady Statistic RA Percent Paced: 19.58 %
Brady Statistic RV Percent Paced: 98.86 %
Date Time Interrogation Session: 20190715160951
Implantable Lead Implant Date: 20130125
Implantable Lead Implant Date: 20130125
Implantable Lead Location: 753859
Implantable Lead Location: 753860
Implantable Lead Model: 5076
Implantable Pulse Generator Implant Date: 20130125
Lead Channel Impedance Value: 432 Ohm
Lead Channel Impedance Value: 440 Ohm
Lead Channel Sensing Intrinsic Amplitude: 3.932 mV
Lead Channel Setting Pacing Amplitude: 2 V
Lead Channel Setting Pacing Amplitude: 2.5 V
Lead Channel Setting Pacing Pulse Width: 0.4 ms
Lead Channel Setting Sensing Sensitivity: 2.1 mV

## 2018-01-02 NOTE — Progress Notes (Signed)
Remote pacemaker transmission.   

## 2018-01-03 ENCOUNTER — Encounter: Payer: Self-pay | Admitting: Cardiology

## 2018-01-25 DIAGNOSIS — R0989 Other specified symptoms and signs involving the circulatory and respiratory systems: Secondary | ICD-10-CM | POA: Diagnosis not present

## 2018-01-25 DIAGNOSIS — G479 Sleep disorder, unspecified: Secondary | ICD-10-CM | POA: Diagnosis not present

## 2018-01-25 DIAGNOSIS — Z0000 Encounter for general adult medical examination without abnormal findings: Secondary | ICD-10-CM | POA: Diagnosis not present

## 2018-01-25 DIAGNOSIS — R7303 Prediabetes: Secondary | ICD-10-CM | POA: Diagnosis not present

## 2018-01-25 DIAGNOSIS — E559 Vitamin D deficiency, unspecified: Secondary | ICD-10-CM | POA: Diagnosis not present

## 2018-02-14 ENCOUNTER — Encounter: Payer: Self-pay | Admitting: Cardiovascular Disease

## 2018-02-14 ENCOUNTER — Ambulatory Visit: Payer: Medicare Other | Admitting: Cardiovascular Disease

## 2018-02-14 VITALS — BP 128/88 | HR 70 | Ht 70.0 in | Wt 162.0 lb

## 2018-02-14 DIAGNOSIS — I442 Atrioventricular block, complete: Secondary | ICD-10-CM | POA: Diagnosis not present

## 2018-02-14 DIAGNOSIS — Z950 Presence of cardiac pacemaker: Secondary | ICD-10-CM | POA: Diagnosis not present

## 2018-02-14 DIAGNOSIS — I4729 Other ventricular tachycardia: Secondary | ICD-10-CM

## 2018-02-14 DIAGNOSIS — I471 Supraventricular tachycardia: Secondary | ICD-10-CM | POA: Diagnosis not present

## 2018-02-14 DIAGNOSIS — I472 Ventricular tachycardia: Secondary | ICD-10-CM

## 2018-02-14 DIAGNOSIS — I4719 Other supraventricular tachycardia: Secondary | ICD-10-CM

## 2018-02-14 NOTE — Progress Notes (Signed)
Patient ID: Casey Richard, male   DOB: 04-07-1956, 62 y.o.   MRN: [id number]    Cardiology Office Note    Date:  02/16/2018   ID:  Casey Richard, DOB [birth year], MRN [id number]  PCP:  Casey Mccreedy, MD  Cardiologist:   Casey Klein, MD   No chief complaint on file.   History of Present Illness:  Casey Richard is a 62 y.o. male who presents for follow-up for complete heart block and pacemaker check.   Once again, from a physical standpoint Casey Richard is doing very well without any cardiac needs.  He struggles taking care of children since his wife's demise.  He is worried about their financial future.  The patient specifically denies any chest pain at rest exertion, dyspnea at rest or with exertion, orthopnea, paroxysmal nocturnal dyspnea, syncope, palpitations, focal neurological deficits, intermittent claudication, lower extremity edema, unexplained weight gain, cough, hemoptysis or wheezing.  He has complete heart block without any detectable R waves. His pacemaker is functioning normally. Battery voltage is 2.92 V (ERI 2.81).  Based on this the estimated generator longevity is 23-28 months.  He has 100% ventricular pacing and also has 30% atrial pacing. Activity level remains high at roughly 5 hours per day.  He has had one episode of nonsustained ventricular tachycardia, which was very brief.  There is no evidence of atrial fibrillation  He presented with cardiac arrest and a prolonged intensive care unit stay in 2013. He has a dual-chamber Medtronic Revo MRI conditional device. Following his cardiac arrest he has had issues with short-term memory loss.  Past Medical History:  Diagnosis Date  . Cardiac arrest ([place])   . CHB (complete heart block) ([place])   . 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 Dysrhythmia 1/13   bradycardia, VF, cardiac arrest/complete heart block with pacer inserted/LOV Dr Casey Richard 07/21/11 with interrogation and  anesthesia guideline order on chart. Chest x ray 1/13 EPIC,, TEE and [facility]  1/13 EPIC  . Memory loss, short term    from fall with heart block 1/13  . Nonsustained ventricular tachycardia ([place]) 02/07/2014  . Presence of permanent cardiac pacemaker 07/15/2011   Medtronic Revo  . Seasonal allergies   . Shortness of breath     Past Surgical History:  Procedure Laterality Date  . CARDIAC CATHETERIZATION  07/09/2011   Normal coronaries  . CYSTOSCOPY WITH URETHRAL DILATATION  08/19/2011   Procedure: CYSTOSCOPY WITH URETHRAL DILATATION;  Surgeon: Casey Gray, MD;  Location: [facility];  Service: Urology;  Laterality: N/A;  Balloon Dilation of Urethral Stricture, retro-urethrogram, Suprapubic change   . LEFT HEART CATHETERIZATION WITH CORONARY ANGIOGRAM N/A 07/09/2011   Procedure: LEFT HEART CATHETERIZATION WITH CORONARY ANGIOGRAM;  Surgeon: Casey Booze, MD;  Location: [facility] CATH LAB;  Service: Cardiovascular;  Laterality: N/A;  . PACEMAKER INSERTION  07/15/11   Medtronic Revo  . PERMANENT PACEMAKER INSERTION Left 07/15/2011   Procedure: PERMANENT PACEMAKER INSERTION;  Surgeon: Casey Klein, MD;  Location: [place] CATH LAB;  Service: Cardiovascular;  Laterality: Left;  . TEMPORARY PACEMAKER INSERTION Right 07/09/2011   Procedure: TEMPORARY PACEMAKER INSERTION;  Surgeon: Casey Booze, MD;  Location[facility] CATH LAB;  Service: Cardiovascular;  Laterality: Right;    Current Medications: Outpatient Medications Prior to Visit  Medication Sig Dispense Refill  . acetaminophen (TYLENOL) 500 MG tablet Take 1,000 mg by mouth every 6 (six) hours as needed. Pain     . Cholecalciferol (HM VITAMIN  D3) 4000 units CAPS Take 1 capsule by mouth daily.    . Multiple Vitamin (MULITIVITAMIN WITH MINERALS) TABS Take 1 tablet by mouth as needed.     . cyanocobalamin 2000 MCG tablet Take 2,000 mcg by mouth daily.     Facility-Administered Medications Prior to Visit  Medication Dose Route Frequency Provider Last Rate  Last Dose  . fluconazole (DIFLUCAN) IVPB 100 mg  100 mg Intravenous Q24H Casey Bring, MD         Allergies:   Patient has no known allergies.   Social History   Socioeconomic History  . Marital status: Married    Spouse name: Not on file  . Number of children: Not on file  . Years of education: Not on file  . Highest education level: Not on file  Occupational History  . Not on file  Social Needs  . Financial resource strain: Not on file  . Food insecurity:    Worry: Not on file    Inability: Not on file  . Transportation needs:    Medical: Not on file    Non-medical: Not on file  Tobacco Use  . Smoking status: Former Smoker    Types: Cigarettes    Last attempt to quit: 06/20/1989    Years since quitting: 28.6  . Smokeless tobacco: Never Used  Substance and Sexual Activity  . Alcohol use: No  . Drug use: No  . Sexual activity: Not on file  Lifestyle  . Physical activity:    Days per week: Not on file    Minutes per session: Not on file  . Stress: Not on file  Relationships  . Social connections:    Talks on phone: Not on file    Gets together: Not on file    Attends religious service: Not on file    Active member of club or organization: Not on file    Attends meetings of clubs or organizations: Not on file    Relationship status: Not on file  Other Topics Concern  . Not on file  Social History Narrative  . Not on file     Family History:  The patient's family history includes COPD in his father; Coronary artery disease in his brother and mother.   ROS:   Please see the history of present illness.    Review of Systems  Eyes: Double vision: .mcrpexn.   All other systems reviewed and are negative.   PHYSICAL EXAM:   VS:  BP 128/88   Pulse 70   Ht 5\' 10"  (1.778 m)   Wt 162 lb (73.5 kg)   BMI 23.24 kg/m    GEN: Well nourished, well developed, in no acute distress   General: Alert, oriented x3, no distress Head: no evidence of trauma, PERRL, EOMI, no  exophtalmos or lid lag, no myxedema, no xanthelasma; normal ears, nose and oropharynx Neck: normal jugular venous pulsations and no hepatojugular reflux; brisk carotid pulses without delay and no carotid bruits Chest: clear to auscultation, no signs of consolidation by percussion or palpation, normal fremitus, symmetrical and full respiratory excursions Cardiovascular: normal position and quality of the apical impulse, regular rhythm, no murmurs, rubs or gallops. Healthy right subclavian pacemaker site. Paradoxically split S2 Abdomen: no tenderness or distention, no masses by palpation, no abnormal pulsatility or arterial bruits, normal bowel sounds, no hepatosplenomegaly Extremities: no clubbing, cyanosis or edema; 2+ radial, ulnar and brachial pulses bilaterally; 2+ right femoral, posterior tibial and dorsalis pedis pulses; 2+ left femoral, posterior  tibial and dorsalis pedis pulses; no subclavian or femoral bruits Neurological: grossly nonfocal Psych: euthymic mood, full affect. He is much more subdued than usual, but does not appear truly depressed.  Wt Readings from Last 3 Encounters:  02/14/18 162 lb (73.5 kg)  02/03/17 158 lb (71.7 kg)  10/27/15 163 lb 2 oz (74 kg)   Studies/Labs Reviewed:   EKG:  EKG is not ordered today.    ASSESSMENT:    1. Complete heart block (HCC)   2. Paroxysmal atrial tachycardia ([place])   3. Nonsustained ventricular tachycardia ([place])   4. Pacemaker     PLAN:  In order of problems listed above:  1. CHB: pacemaker dependent  2. PAT: None seen on the current download. 3. NSVT: Brief, very rare, asymptomatic 4. PPM: normal device function; Continue remote CareLink monitoring every 3 months and a office visit in one year.   Medication Adjustments/Labs and Tests Ordered: Current medicines are reviewed at length with the patient today.  Concerns regarding medicines are outlined above.  Medication changes, Labs and Tests ordered today are listed in the  Patient Instructions below. Patient Instructions  Dr Casey Richard recommends that you continue on your current medications as directed. Please refer to the Current Medication list given to you today.  Remote monitoring is used to monitor your Pacemaker or ICD from home. This monitoring reduces the number of office visits required to check your device to one time per year. It allows [place] to keep an eye on the functioning of your device to ensure it is working properly. You are scheduled for a device check from home on Monday, October 14th, 2019. You may send your transmission at any time that day. If you have a wireless device, the transmission will be sent automatically. After your physician reviews your transmission, you will receive a notification with your next transmission date.  To improve our patient care and to more adequately follow your device, [facility] has decided, as a practice, to start following each patient four times a year with your home monitor. This means that you may experience a remote appointment that is close to an in-office appointment with your physician. Your insurance will apply at the same rate as other remote monitoring transmissions.  Dr Casey Richard recommends that you schedule a follow-up appointment in 12 months with a pacemaker check. You will receive a reminder letter in the mail two months in advance. If you don't receive a letter, please call our office to schedule the follow-up appointment.  If you need a refill on your cardiac medications before your next appointment, please call your pharmacy.    Signed, Casey Klein, MD  02/16/2018 1:39 PM    [facility] [place], [place], [place]  [address] Phone: [phone]; Fax: [phone]

## 2018-02-14 NOTE — Patient Instructions (Signed)
Dr Sallyanne Kuster recommends that you continue on your current medications as directed. Please refer to the Current Medication list given to you today.  Remote monitoring is used to monitor your Pacemaker or ICD from home. This monitoring reduces the number of office visits required to check your device to one time per year. It allows [place] to keep an eye on the functioning of your device to ensure it is working properly. You are scheduled for a device check from home on Monday, October 14th, 2019. You may send your transmission at any time that day. If you have a wireless device, the transmission will be sent automatically. After your physician reviews your transmission, you will receive a notification with your next transmission date.  To improve our patient care and to more adequately follow your device, [facility] has decided, as a practice, to start following each patient four times a year with your home monitor. This means that you may experience a remote appointment that is close to an in-office appointment with your physician. Your insurance will apply at the same rate as other remote monitoring transmissions.  Dr Sallyanne Kuster recommends that you schedule a follow-up appointment in 12 months with a pacemaker check. You will receive a reminder letter in the mail two months in advance. If you don't receive a letter, please call our office to schedule the follow-up appointment.  If you need a refill on your cardiac medications before your next appointment, please call your pharmacy.

## 2018-04-02 ENCOUNTER — Ambulatory Visit (INDEPENDENT_AMBULATORY_CARE_PROVIDER_SITE_OTHER): Payer: Medicare Other | Admitting: *Deleted

## 2018-04-02 ENCOUNTER — Telehealth: Payer: Self-pay | Admitting: Cardiology

## 2018-04-02 DIAGNOSIS — I442 Atrioventricular block, complete: Secondary | ICD-10-CM | POA: Diagnosis not present

## 2018-04-02 NOTE — Telephone Encounter (Signed)
Spoke with pt and reminded pt of remote transmission that is due today. Pt verbalized understanding.   

## 2018-04-03 NOTE — Progress Notes (Signed)
Remote pacemaker transmission.   

## 2018-04-05 ENCOUNTER — Encounter: Payer: Self-pay | Admitting: Cardiology

## 2018-04-27 LAB — CUP PACEART REMOTE DEVICE CHECK
Battery Voltage: 2.9 V
Brady Statistic AP VP Percent: 44.29 %
Brady Statistic AP VS Percent: 0 %
Brady Statistic AS VP Percent: 55.59 %
Brady Statistic AS VS Percent: 0.12 %
Brady Statistic RA Percent Paced: 44.06 %
Brady Statistic RV Percent Paced: 99.51 %
Date Time Interrogation Session: 20191015193506
Implantable Lead Implant Date: 20130125
Implantable Lead Implant Date: 20130125
Implantable Lead Location: 753859
Implantable Lead Location: 753860
Implantable Lead Model: 5076
Implantable Pulse Generator Implant Date: 20130125
Lead Channel Impedance Value: 432 Ohm
Lead Channel Impedance Value: 464 Ohm
Lead Channel Sensing Intrinsic Amplitude: 3.092 mV
Lead Channel Setting Pacing Amplitude: 2 V
Lead Channel Setting Pacing Amplitude: 2.5 V
Lead Channel Setting Pacing Pulse Width: 0.4 ms
Lead Channel Setting Sensing Sensitivity: 2.1 mV

## 2018-05-09 DIAGNOSIS — R3912 Poor urinary stream: Secondary | ICD-10-CM | POA: Diagnosis not present

## 2018-05-09 DIAGNOSIS — N3000 Acute cystitis without hematuria: Secondary | ICD-10-CM | POA: Diagnosis not present

## 2018-06-04 DIAGNOSIS — N3000 Acute cystitis without hematuria: Secondary | ICD-10-CM | POA: Diagnosis not present

## 2018-06-25 DIAGNOSIS — N3000 Acute cystitis without hematuria: Secondary | ICD-10-CM | POA: Diagnosis not present

## 2018-07-04 ENCOUNTER — Telehealth: Payer: Self-pay

## 2018-07-04 NOTE — Telephone Encounter (Signed)
Spoke with patient to remind of missed remote transmission 

## 2018-07-05 ENCOUNTER — Ambulatory Visit (INDEPENDENT_AMBULATORY_CARE_PROVIDER_SITE_OTHER): Payer: Medicare Other

## 2018-07-05 DIAGNOSIS — I442 Atrioventricular block, complete: Secondary | ICD-10-CM

## 2018-07-06 NOTE — Progress Notes (Signed)
Remote pacemaker transmission.   

## 2018-07-07 LAB — CUP PACEART REMOTE DEVICE CHECK
Battery Voltage: 2.9 V
Brady Statistic AP VP Percent: 38.34 %
Brady Statistic AP VS Percent: 0 %
Brady Statistic AS VP Percent: 61.47 %
Brady Statistic AS VS Percent: 0.19 %
Brady Statistic RA Percent Paced: 38.15 %
Brady Statistic RV Percent Paced: 99.51 %
Date Time Interrogation Session: 20200116211929
Implantable Lead Implant Date: 20130125
Implantable Lead Implant Date: 20130125
Implantable Lead Location: 753859
Implantable Lead Location: 753860
Implantable Lead Model: 5076
Implantable Pulse Generator Implant Date: 20130125
Lead Channel Impedance Value: 456 Ohm
Lead Channel Impedance Value: 488 Ohm
Lead Channel Sensing Intrinsic Amplitude: 3.622 mV
Lead Channel Setting Pacing Amplitude: 2 V
Lead Channel Setting Pacing Amplitude: 2.5 V
Lead Channel Setting Pacing Pulse Width: 0.4 ms
Lead Channel Setting Sensing Sensitivity: 2.1 mV

## 2018-07-24 LAB — CUP PACEART INCLINIC DEVICE CHECK
Date Time Interrogation Session: 20200204135744
Implantable Lead Implant Date: 20130125
Implantable Lead Implant Date: 20130125
Implantable Lead Location: 753859
Implantable Lead Location: 753860
Implantable Lead Model: 5076
Implantable Pulse Generator Implant Date: 20130125

## 2018-10-04 ENCOUNTER — Encounter: Payer: Medicare Other | Admitting: *Deleted

## 2018-10-04 ENCOUNTER — Other Ambulatory Visit: Payer: Self-pay

## 2018-10-05 ENCOUNTER — Telehealth: Payer: Self-pay

## 2018-10-05 NOTE — Telephone Encounter (Signed)
Unable to leave a message for patient to remind of missed remote transmission.  

## 2018-10-05 NOTE — Telephone Encounter (Signed)
Left message for patient to remind of missed remote transmission.  

## 2018-10-10 ENCOUNTER — Encounter: Payer: Self-pay | Admitting: Cardiology

## 2019-01-08 DIAGNOSIS — H9202 Otalgia, left ear: Secondary | ICD-10-CM | POA: Diagnosis not present

## 2019-08-19 ENCOUNTER — Ambulatory Visit (INDEPENDENT_AMBULATORY_CARE_PROVIDER_SITE_OTHER): Payer: Medicare Other | Admitting: *Deleted

## 2019-08-19 DIAGNOSIS — I442 Atrioventricular block, complete: Secondary | ICD-10-CM | POA: Diagnosis not present

## 2019-08-19 LAB — CUP PACEART REMOTE DEVICE CHECK
Battery Voltage: 2.85 V
Brady Statistic AP VP Percent: 24.89 %
Brady Statistic AP VS Percent: 0 %
Brady Statistic AS VP Percent: 74.67 %
Brady Statistic AS VS Percent: 0.44 %
Brady Statistic RA Percent Paced: 24.8 %
Brady Statistic RV Percent Paced: 99.36 %
Date Time Interrogation Session: 20210301162355
Implantable Lead Implant Date: 20130125
Implantable Lead Implant Date: 20130125
Implantable Lead Location: 753859
Implantable Lead Location: 753860
Implantable Lead Model: 5076
Implantable Pulse Generator Implant Date: 20130125
Lead Channel Impedance Value: 440 Ohm
Lead Channel Impedance Value: 504 Ohm
Lead Channel Sensing Intrinsic Amplitude: 3.49 mV
Lead Channel Setting Pacing Amplitude: 2 V
Lead Channel Setting Pacing Amplitude: 2.5 V
Lead Channel Setting Pacing Pulse Width: 0.4 ms
Lead Channel Setting Sensing Sensitivity: 2.1 mV

## 2019-08-20 ENCOUNTER — Telehealth: Payer: Self-pay | Admitting: Cardiovascular Disease

## 2019-08-20 NOTE — Telephone Encounter (Signed)
New Message:     Pt says he needs a form from Dr, so he can have his [place] deferred.Marland Kitchen

## 2019-08-20 NOTE — Telephone Encounter (Signed)
Attempted to return call-phone number not accepting calls at this time

## 2019-08-20 NOTE — Progress Notes (Signed)
PPM Remote  

## 2019-08-22 NOTE — Telephone Encounter (Signed)
Unable to reach pt or leave a message wrong number

## 2019-08-29 NOTE — Telephone Encounter (Signed)
Will await pt's return call ./cy

## 2019-09-05 ENCOUNTER — Other Ambulatory Visit: Payer: Self-pay

## 2019-09-05 ENCOUNTER — Emergency Department (HOSPITAL_BASED_OUTPATIENT_CLINIC_OR_DEPARTMENT_OTHER)
Admission: EM | Admit: 2019-09-05 | Discharge: 2019-09-05 | Disposition: A | Discharge status: Home / Self Care | Payer: Medicare Other | Attending: Emergency Medicine | Admitting: Emergency Medicine

## 2019-09-05 ENCOUNTER — Encounter (HOSPITAL_BASED_OUTPATIENT_CLINIC_OR_DEPARTMENT_OTHER): Payer: Self-pay

## 2019-09-05 DIAGNOSIS — N3001 Acute cystitis with hematuria: Secondary | ICD-10-CM | POA: Insufficient documentation

## 2019-09-05 DIAGNOSIS — G479 Sleep disorder, unspecified: Secondary | ICD-10-CM | POA: Diagnosis not present

## 2019-09-05 DIAGNOSIS — R3989 Other symptoms and signs involving the genitourinary system: Secondary | ICD-10-CM | POA: Diagnosis present

## 2019-09-05 DIAGNOSIS — R0989 Other specified symptoms and signs involving the circulatory and respiratory systems: Secondary | ICD-10-CM | POA: Diagnosis not present

## 2019-09-05 DIAGNOSIS — Z87891 Personal history of nicotine dependence: Secondary | ICD-10-CM | POA: Diagnosis not present

## 2019-09-05 DIAGNOSIS — R339 Retention of urine, unspecified: Secondary | ICD-10-CM | POA: Diagnosis not present

## 2019-09-05 DIAGNOSIS — Z79899 Other long term (current) drug therapy: Secondary | ICD-10-CM | POA: Insufficient documentation

## 2019-09-05 DIAGNOSIS — R7303 Prediabetes: Secondary | ICD-10-CM | POA: Diagnosis not present

## 2019-09-05 DIAGNOSIS — R338 Other retention of urine: Secondary | ICD-10-CM

## 2019-09-05 DIAGNOSIS — E559 Vitamin D deficiency, unspecified: Secondary | ICD-10-CM | POA: Diagnosis not present

## 2019-09-05 DIAGNOSIS — R3911 Hesitancy of micturition: Secondary | ICD-10-CM | POA: Diagnosis not present

## 2019-09-05 LAB — URINALYSIS, ROUTINE W REFLEX MICROSCOPIC
Bilirubin Urine: NEGATIVE
Glucose, UA: NEGATIVE mg/dL
Ketones, ur: NEGATIVE mg/dL
Nitrite: NEGATIVE
Protein, ur: NEGATIVE mg/dL
Specific Gravity, Urine: 1.01 (ref 1.005–1.030)
pH: 7 (ref 5.0–8.0)

## 2019-09-05 LAB — URINALYSIS, MICROSCOPIC (REFLEX)

## 2019-09-05 MED ORDER — CEPHALEXIN 500 MG PO CAPS: 500.0000 mg | ORAL_CAPSULE | Freq: Four times a day (QID) | ORAL | 0 refills | Status: AC

## 2019-09-05 MED ORDER — CEPHALEXIN 250 MG PO CAPS
500.0000 mg | ORAL_CAPSULE | Freq: Once | ORAL | Status: AC
  Administered 2019-09-05: 500 mg via ORAL
  Filled 2019-09-05: qty 2

## 2019-09-05 NOTE — ED Triage Notes (Signed)
Pt states he started having urinary retention since last PM. Pt c/o severe pain in his bladder.

## 2019-09-05 NOTE — Discharge Instructions (Addendum)
You may have diarrhea from the antibiotics.  It is very important that you continue to take the antibiotics even if you get diarrhea unless a medical professional tells you that you may stop taking them.  If you stop too early the bacteria you are being treated for will become stronger and you may need different, more powerful antibiotics that have more side effects and worsening diarrhea.  Please stay well hydrated and consider probiotics as they may decrease the severity of your diarrhea.   °

## 2019-09-05 NOTE — ED Provider Notes (Signed)
[facility] EMERGENCY DEPARTMENT Provider Note   CSN: [id number] Arrival date & time: 09/05/19  1647     History No chief complaint on file.   CATHY FURY is a 64 y.o. male with a past medical history of CKD, memory loss, cardiac arrest in 2013 after which he had a suprapubic catheter put in due to false passage creation and ureteral stricture, who presents today for evaluation of significant pain in his bladder.  He reports that he has recently been being seen at [facility] for a right-sided head infection.  He states he is on medicines however he does not know which ones.  He reports that he has been unable to urinate since last night.  He denies any fevers or flank pain.  No recent dysuria, frequency, urgency   HPI     Past Medical History:  Diagnosis Date  . Cardiac arrest ([place])   . CHB (complete heart block) ([place])   . 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 Dysrhythmia 1/13   bradycardia, VF, cardiac arrest/complete heart block with pacer inserted/LOV Dr Sallyanne Kuster 07/21/11 with interrogation and anesthesia guideline order on chart. Chest x ray 1/13 EPIC,, TEE and [facility]  1/13 EPIC  . Memory loss, short term    from fall with heart block 1/13  . Nonsustained ventricular tachycardia ([place]) 02/07/2014  . Presence of permanent cardiac pacemaker 07/15/2011   Medtronic Revo  . Seasonal allergies   . Shortness of breath     Patient Active Problem List   Diagnosis Date Noted  . Paroxysmal atrial tachycardia ([place]) 10/27/2015  . Nonsustained ventricular tachycardia ([place]) 02/07/2014  . CHB (complete heart block) ([place])   . Memory loss, short term   . 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 NSTEMI (non-ST elevated myocardial infarction) ([place]) 07/10/2011  . Ventricular fibrillation ([place]) 07/09/2011  . 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 Complete heart block, requiring external pacing then temp wire  pacing. 07/09/2011    Past Surgical History:  Procedure Laterality Date  . CARDIAC CATHETERIZATION  07/09/2011   Normal coronaries  . CYSTOSCOPY WITH URETHRAL DILATATION  08/19/2011   Procedure: CYSTOSCOPY WITH URETHRAL DILATATION;  Surgeon: Dutch Gray, MD;  Location: [facility];  Service: Urology;  Laterality: N/A;  Balloon Dilation of Urethral Stricture, retro-urethrogram, Suprapubic change   . LEFT HEART CATHETERIZATION WITH CORONARY ANGIOGRAM N/A 07/09/2011   Procedure: LEFT HEART CATHETERIZATION WITH CORONARY ANGIOGRAM;  Surgeon: Jettie Booze, MD;  Location: [facility] CATH LAB;  Service: Cardiovascular;  Laterality: N/A;  . PACEMAKER INSERTION  07/15/11   Medtronic Revo  . PERMANENT PACEMAKER INSERTION Left 07/15/2011   Procedure: PERMANENT PACEMAKER INSERTION;  Surgeon: Sanda Klein, MD;  Location: [facility] CATH LAB;  Service: Cardiovascular;  Laterality: Left;  . TEMPORARY PACEMAKER INSERTION Right 07/09/2011   Procedure: TEMPORARY PACEMAKER INSERTION;  Surgeon: Jettie Booze, MD;  Location[facility] CATH LAB;  Service: Cardiovascular;  Laterality: Right;       Family History  Problem Relation Age of Onset  . Coronary artery disease Mother        AICD  . COPD Father   . Coronary artery disease Brother     Social History   Tobacco Use  . Smoking status: Former Smoker    Types: Cigarettes    Quit date: 06/20/1989    Years since quitting: 30.2  . Smokeless tobacco: Never Used  Substance Use Topics  . Alcohol use: No  .  Drug use: No    Home Medications Prior to Admission medications   Medication Sig Start Date End Date Taking? Authorizing Provider  acetaminophen (TYLENOL) 500 MG tablet Take 1,000 mg by mouth every 6 (six) hours as needed. Pain     [provider]  cephALEXin (KEFLEX) 500 MG capsule Take 1 capsule (500 mg total) by mouth 4 (four) times daily for 7 days. 09/05/19 09/12/19  Lorin Glass, PA-C  Cholecalciferol (HM VITAMIN D3) 4000 units CAPS Take 1  capsule by mouth daily.    [provider]  cyanocobalamin 2000 MCG tablet Take 2,000 mcg by mouth daily.    [provider]  Multiple Vitamin (MULITIVITAMIN WITH MINERALS) TABS Take 1 tablet by mouth as needed.     [provider]    Allergies    Patient has no known allergies.  Review of Systems   Review of Systems  Constitutional: Negative for chills and fever.  Respiratory: Negative for cough and shortness of breath.   Gastrointestinal: Negative for abdominal pain.  Genitourinary: Positive for decreased urine volume and difficulty urinating. Negative for discharge, dysuria, frequency, hematuria, penile pain, penile swelling and scrotal swelling.       Suprapubic pain  Musculoskeletal: Negative for back pain and neck pain.  Neurological: Negative for headaches.  Psychiatric/Behavioral: Negative for confusion.  All other systems reviewed and are negative.   Physical Exam Updated Vital Signs BP 110/67 (BP Location: Right Arm)   Pulse 70   Temp 97.9 F (36.6 C) (Oral)   Resp 14   Ht 5\' 10"  (1.778 m)   Wt 63.5 kg   SpO2 94%   BMI 20.09 kg/m   Physical Exam Vitals and nursing note reviewed.  Constitutional:      General: He is not in acute distress.    Appearance: He is well-developed. He is not diaphoretic.  HENT:     Head: Normocephalic and atraumatic.  Eyes:     General: No scleral icterus.       Right eye: No discharge.        Left eye: No discharge.     Conjunctiva/sclera: Conjunctivae normal.  Cardiovascular:     Rate and Rhythm: Normal rate and regular rhythm.  Pulmonary:     Effort: Pulmonary effort is normal. No respiratory distress.     Breath sounds: No stridor.  Abdominal:     General: There is no distension.     Comments: Suprapubic TTP, fullness  Genitourinary:    Penis: Normal.   Musculoskeletal:        General: No deformity.     Cervical back: Normal range of motion.  Skin:    General: Skin is warm and dry.    Neurological:     General: No focal deficit present.     Mental Status: He is alert.     Motor: No abnormal muscle tone.  Psychiatric:        Behavior: Behavior normal.     ED Results / Procedures / Treatments   Labs (all labs ordered are listed, but only abnormal results are displayed) Labs Reviewed  URINALYSIS, ROUTINE W REFLEX MICROSCOPIC - Abnormal; Notable for the following components:      Result Value   APPearance CLOUDY (*)    Hgb urine dipstick MODERATE (*)    Leukocytes,Ua SMALL (*)    All other components within normal limits  URINALYSIS, MICROSCOPIC (REFLEX) - Abnormal; Notable for the following components:   Bacteria, UA MANY (*)  All other components within normal limits  URINE CULTURE    EKG None  Radiology No results found.  Procedures Procedures (including critical care time)  Medications Ordered in ED Medications  cephALEXin (KEFLEX) capsule 500 mg (500 mg Oral Given 09/05/19 1933)    ED Course  I have reviewed the triage vital signs and the nursing notes.  Pertinent labs & imaging results that were available during my care of the patient were reviewed by me and considered in my medical decision making (see chart for details).    MDM Rules/Calculators/A&P                     Patient is a 64 year old man who presents today for evaluation of inability to urinate since last night.  Bladder scan was performed showing over a liter of urine in his bladder.  Multiple attempts were made by RN who was eventually able to place a Foley catheter.  Approximately 1 L of urine drained out, after which patient reported he felt much better and back to normal.  Urine had visible sediment, UA shows concern for infection with microscopic showing 6-10 red cells, 21-50 white cells and many bacteria with only 6-10 squamous epithelial cells.  He is given his first dose of Keflex while in the emergency room.  Urine is sent for culture.  He is given prescription for  additional Keflex. He is already established with urology as he has had issues with strictures in the past and has previously had a suprapubic cath placed.  Recommended urology follow-up.  He is afebrile, not tachycardic or tachypneic, no indication for blood work or IV antibiotics at this time.  This patient was seen as a shared visit with Dr. Alvino Chapel.  Return precautions were discussed with patient who states their understanding.  At the time of discharge patient denied any unaddressed complaints or concerns.  Patient is agreeable for discharge home.  Note: Portions of this report may have been transcribed using voice recognition software. Every effort was made to ensure accuracy; however, inadvertent computerized transcription errors may be present  Final Clinical Impression(s) / ED Diagnoses Final diagnoses:  Acute urinary retention  Acute cystitis with hematuria    Rx / DC Orders ED Discharge Orders         Ordered    cephALEXin (KEFLEX) 500 MG capsule  4 times daily     09/05/19 1924           Ollen Gross 09/06/19 0015    Davonna Belling, MD 09/06/19 [phone]

## 2019-09-07 LAB — URINE CULTURE: Culture: >=100000 — AB

## 2019-09-08 ENCOUNTER — Telehealth: Payer: Self-pay | Admitting: Emergency Medicine

## 2019-09-08 NOTE — Telephone Encounter (Signed)
Post ED Visit - Positive Culture Follow-up  Culture report reviewed by antimicrobial stewardship pharmacist: [place] Team []  Elenor Quinones, Pharm.D. []  Heide Guile, Pharm.D., BCPS AQ-ID []  Parks Neptune, Pharm.D., BCPS []  Alycia Rossetti, Pharm.D., BCPS []  [place], Pharm.D., BCPS, AAHIVP []  Legrand Como, Pharm.D., BCPS, AAHIVP [x]  Salome Arnt, PharmD, BCPS []  Johnnette Gourd, PharmD, BCPS []  Hughes Better, PharmD, BCPS []  Leeroy Cha, PharmD []  Laqueta Linden, PharmD, BCPS []  Albertina Parr, PharmD  [facility] Team []  Leodis Sias, PharmD []  Lindell Spar, PharmD []  Royetta Asal, PharmD []  Graylin Shiver, Rph []  Rema Fendt) Glennon Mac, PharmD []  Arlyn Dunning, PharmD []  [name], PharmD []  Dia Sitter, PharmD []  Leone Haven, PharmD []  Gretta Arab, PharmD []  Theodis Shove, PharmD []  Peggyann Juba, PharmD []  Reuel Boom, PharmD   Positive urine culture Treated with Cephalexin, likely contaminant, and no further patient follow-up is required at this time.  Sandi Raveling Ciaira Natividad 09/08/2019, 1:04 PM

## 2019-09-11 DIAGNOSIS — R3911 Hesitancy of micturition: Secondary | ICD-10-CM | POA: Diagnosis not present

## 2019-09-11 DIAGNOSIS — G479 Sleep disorder, unspecified: Secondary | ICD-10-CM | POA: Diagnosis not present

## 2019-09-11 DIAGNOSIS — R7303 Prediabetes: Secondary | ICD-10-CM | POA: Diagnosis not present

## 2019-09-11 DIAGNOSIS — E559 Vitamin D deficiency, unspecified: Secondary | ICD-10-CM | POA: Diagnosis not present

## 2019-09-11 DIAGNOSIS — R0989 Other specified symptoms and signs involving the circulatory and respiratory systems: Secondary | ICD-10-CM | POA: Diagnosis not present

## 2019-09-16 ENCOUNTER — Ambulatory Visit (INDEPENDENT_AMBULATORY_CARE_PROVIDER_SITE_OTHER): Payer: Medicare Other | Admitting: Cardiovascular Disease

## 2019-09-16 ENCOUNTER — Encounter: Payer: Self-pay | Admitting: Cardiovascular Disease

## 2019-09-16 ENCOUNTER — Other Ambulatory Visit: Payer: Self-pay

## 2019-09-16 VITALS — BP 140/80 | HR 71 | Ht 70.0 in | Wt 150.0 lb

## 2019-09-16 DIAGNOSIS — R339 Retention of urine, unspecified: Secondary | ICD-10-CM | POA: Diagnosis not present

## 2019-09-16 DIAGNOSIS — I471 Supraventricular tachycardia: Secondary | ICD-10-CM | POA: Diagnosis not present

## 2019-09-16 DIAGNOSIS — I442 Atrioventricular block, complete: Secondary | ICD-10-CM | POA: Diagnosis not present

## 2019-09-16 DIAGNOSIS — R200 Anesthesia of skin: Secondary | ICD-10-CM | POA: Diagnosis not present

## 2019-09-16 DIAGNOSIS — I4729 Other ventricular tachycardia: Secondary | ICD-10-CM

## 2019-09-16 DIAGNOSIS — I472 Ventricular tachycardia: Secondary | ICD-10-CM | POA: Diagnosis not present

## 2019-09-16 NOTE — Progress Notes (Signed)
Patient ID: Casey Richard, male   DOB: [birth year], 64 y.o.   MRN: [id number]    Cardiology Office Note    Date:  09/16/2019   ID:  Casey Richard, DOB [birth year], MRN [id number]  PCP:  Casey Mccreedy, MD  Cardiologist:   Casey Klein, MD   Chief Complaint  Patient presents with  . Pacemaker Check    History of Present Illness:  Casey Richard is a 64 y.o. male who presents for follow-up for complete heart block and pacemaker check.   [name] continues to struggle with the emotional and emotional issues related to raising his children since his wife's demise from cancer.  He has lost weight and looks a little more disheveled this year.  Continues to have issues with his memory.  A few days ago he was seen in the emergency room for urinary retention.  He had 1 L of urine in his bladder.  He now has an indwelling Foley implant upcoming appointment with Dr. Raynelle Bring in the urology clinic.  He did have urethral trauma at the time of his syncopal event when he received the pacemaker.  She reports having a "face infection".  This has been going on for a couple of months.  When I asked him in more detail what he means he describes a variety of neurological complaints including numbness and paresthesias in the distribution of all 3 branches of the trigeminal nerve on the right side as well as loss of normal taste in the right side at this time.  If not a particularly painful sensation, but otherwise appears to have some features reminiscent of trigeminal neuralgia.  A few months ago while driving towards the ceiling he developed complete loss of sensation to touch and his left, which resolved spontaneously and has not recurred.  He has not had any motor complaints.  Pacemaker interrogation shows normal device function, however his generator is approaching RRT (current voltage 2.5 V, RRT at 2.81 V).  He has 31% atrial pacing and 99.6% ventricular pacing.  There is no underlying escape  rhythm at baseline with treatment and 30 bpm.  It appears the remainder of the 0.4% ventricular beats are PVCs.  Note is made of marked decrease in his level of physical activity since February 2021.  He had a 13-second episode of paroxysmal atrial tachycardia but has not had any ventricular tachycardia.  Lead parameters are excellent.  He presented with cardiac arrest and a prolonged intensive care unit stay in 2013. He has a dual-chamber Medtronic Revo MRI conditional device. Following his cardiac arrest he has had issues with short-term memory loss.  He had significant urethral injury at the time of his cardiac arrest.  Past Medical History:  Diagnosis Date  . Cardiac arrest ([place])   . CHB (complete heart block) ([place])   . 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 Dysrhythmia 1/13   bradycardia, VF, cardiac arrest/complete heart block with pacer inserted/LOV Dr Casey Richard 07/21/11 with interrogation and anesthesia guideline order on chart. Chest x ray 1/13 EPIC,, TEE and [facility]  1/13 EPIC  . Memory loss, short term    from fall with heart block 1/13  . Nonsustained ventricular tachycardia ([place]) 02/07/2014  . Presence of permanent cardiac pacemaker 07/15/2011   Medtronic Revo  . Seasonal allergies   . Shortness of breath     Past Surgical History:  Procedure Laterality Date  . CARDIAC CATHETERIZATION  07/09/2011  Normal coronaries  . CYSTOSCOPY WITH URETHRAL DILATATION  08/19/2011   Procedure: CYSTOSCOPY WITH URETHRAL DILATATION;  Surgeon: Casey Gray, MD;  Location: [facility] ORS;  Service: Urology;  Laterality: N/A;  Balloon Dilation of Urethral Stricture, retro-urethrogram, Suprapubic change   . LEFT HEART CATHETERIZATION WITH CORONARY ANGIOGRAM N/A 07/09/2011   Procedure: LEFT HEART CATHETERIZATION WITH CORONARY ANGIOGRAM;  Surgeon: Casey Booze, MD;  Location: [facility] CATH LAB;  Service: Cardiovascular;  Laterality: N/A;  . PACEMAKER INSERTION  07/15/11    Medtronic Revo  . PERMANENT PACEMAKER INSERTION Left 07/15/2011   Procedure: PERMANENT PACEMAKER INSERTION;  Surgeon: Casey Klein, MD;  Location: [facility] CATH LAB;  Service: Cardiovascular;  Laterality: Left;  . TEMPORARY PACEMAKER INSERTION Right 07/09/2011   Procedure: TEMPORARY PACEMAKER INSERTION;  Surgeon: Casey Booze, MD;  Location: [facility] CATH LAB;  Service: Cardiovascular;  Laterality: Right;    Current Medications: Outpatient Medications Prior to Visit  Medication Sig Dispense Refill  . acetaminophen (TYLENOL) 500 MG tablet Take 1,000 mg by mouth every 6 (six) hours as needed. Pain     . Cholecalciferol (HM VITAMIN D3) 4000 units CAPS Take 1 capsule by mouth daily.    . cyanocobalamin 2000 MCG tablet Take 2,000 mcg by mouth daily.    . Multiple Vitamin (MULITIVITAMIN WITH MINERALS) TABS Take 1 tablet by mouth as needed.      Facility-Administered Medications Prior to Visit  Medication Dose Route Frequency Provider Last Rate Last Admin  . fluconazole (DIFLUCAN) IVPB 100 mg  100 mg Intravenous Q24H Raynelle Bring, MD         Allergies:   Patient has no known allergies.   Social History   Socioeconomic History  . Marital status: Widowed    Spouse name: Not on file  . Number of children: Not on file  . Years of education: Not on file  . Highest education level: Not on file  Occupational History  . Not on file  Tobacco Use  . Smoking status: Former Smoker    Types: Cigarettes    Quit date: 06/20/1989    Years since quitting: 30.2  . Smokeless tobacco: Never Used  Substance and Sexual Activity  . Alcohol use: No  . Drug use: No  . Sexual activity: Not on file  Other Topics Concern  . Not on file  Social History Narrative  . Not on file   Social Determinants of Health   Financial Resource Strain:   . Difficulty of Paying Living Expenses:   Food Insecurity:   . Worried About Charity fundraiser in the Last Year:   . Arboriculturist in the Last Year:    Transportation Needs:   . Film/video editor (Medical):   Marland Kitchen Lack of Transportation (Non-Medical):   Physical Activity:   . Days of Exercise per Week:   . Minutes of Exercise per Session:   Stress:   . Feeling of Stress :   Social Connections:   . Frequency of Communication with Friends and Family:   . Frequency of Social Gatherings with Friends and Family:   . Attends Religious Services:   . Active Member of Clubs or Organizations:   . Attends Archivist Meetings:   Marland Kitchen Marital Status:      Family History:  The patient's family history includes COPD in his father; Coronary artery disease in his brother and mother.   ROS:   Please see the history of present illness.    All other systems  are reviewed and are negative.   PHYSICAL EXAM:   VS:  BP 140/80   Pulse 71   Ht 5\' 10"  (1.778 m)   Wt 150 lb (68 kg)   SpO2 95%   BMI 21.52 kg/m    General: Alert, oriented x3, no distress, borderline underweight.  Well-healed left subclavian pacemaker site Head: no evidence of trauma, PERRL, EOMI, no exophtalmos or lid lag, no myxedema, no xanthelasma; normal ears, nose and oropharynx Neck: normal jugular venous pulsations and no hepatojugular reflux; brisk carotid pulses without delay and no carotid bruits Chest: clear to auscultation, no signs of consolidation by percussion or palpation, normal fremitus, symmetrical and full respiratory excursions Cardiovascular: normal position and quality of the apical impulse, regular rhythm, normal first and paradoxically split second heart sounds, no murmurs, rubs or gallops Abdomen: no tenderness or distention, no masses by palpation, no abnormal pulsatility or arterial bruits, normal bowel sounds, no hepatosplenomegaly Extremities: no clubbing, cyanosis or edema; 2+ radial, ulnar and brachial pulses bilaterally; 2+ right femoral, posterior tibial and dorsalis pedis pulses; 2+ left femoral, posterior tibial and dorsalis pedis pulses; no  subclavian or femoral bruits Neurological: grossly nonfocal Psych: Normal mood and affect   Wt Readings from Last 3 Encounters:  09/16/19 150 lb (68 kg)  09/05/19 140 lb (63.5 kg)  02/14/18 162 lb (73.5 kg)   Studies/Labs Reviewed:   EKG:  EKG is ordered today.  It shows AV sequential pacing with a single PVC  ASSESSMENT:    1. Complete heart block ([place])   2. PAT (paroxysmal atrial tachycardia) ([place])   3. NSVT (nonsustained ventricular tachycardia) ([place])   4. Urinary retention   5. Numbness     PLAN:  In order of problems listed above:  1. CHB: Pacemaker dependent. 2. PAT: Very low prevalence, asymptomatic. 3. NSVT: Previously seen, none in the last 12 months. 4. PPM: We will increase the frequency of dialysis to monthly since he is pacemaker dependent and is approaching quality. 5. Urinary retention/indwelling catheter: Has an upcoming appointment with urology. 6. Right facial numbness/left upper extremity numbness: Not sure about the type of neurological symptoms together.  I recommended he has an evaluation by a neurologist.  He wants to delay that until after he resolves his urological complaints.  He does have an MRI conditional pacing system and with the right reprogramming can have an MRI of the brain if recommended.   Medication Adjustments/Labs and Tests Ordered: Current medicines are reviewed at length with the patient today.  Concerns regarding medicines are outlined above.  Medication changes, Labs and Tests ordered today are listed in the Patient Instructions below. Patient Instructions  Medication Instructions:  No changes  If you need a refill on your cardiac medications before your next appointment, please call your pharmacy.   Lab work: None ordered If you have labs (blood work) drawn today and your tests are completely normal, you will receive your results only by: [place] (if you have MyChart) OR A paper copy in the mail If you have any lab  test that is abnormal or we need to change your treatment, we will call you to review the results.  Testing/Procedures: None ordered  Follow-Up: At [facility], you and your health needs are our priority.  As part of our continuing mission to provide you with exceptional heart care, we have created designated Provider Care Teams.  These Care Teams include your primary Cardiologist (physician) and Advanced Practice Providers (APPs -  Physician Assistants  and Nurse Practitioners) who all work together to provide you with the care you need, when you need it.  We recommend signing up for the patient portal called "MyChart".  Sign up information is provided on this After Visit Summary.  MyChart is used to connect with patients for Virtual Visits (Telemedicine).  Patients are able to view lab/test results, encounter notes, upcoming appointments, etc.  Non-urgent messages can be sent to your provider as well.   To learn more about what you can do with MyChart, go to [email or web address].    Your next appointment:   12 month(s)  The format for your next appointment:   In Person  Provider:   Sanda Klein, MD   Remote monitoring is used to monitor your pacemaker from home. This monitoring reduces the number of office visits required to check your device to one time per year. It allows [place] to keep an eye on the functioning of your device to ensure it is working properly. You are scheduled for a device check from home on 09/19/19. You may send your transmission at any time that day. If you have a wireless device, the transmission will be sent automatically.      Signed, Casey Klein, MD  09/16/2019 4:25 PM    [facility] [address], [place], [place]  [address] Phone: [phone]; Fax: [phone]

## 2019-09-16 NOTE — Patient Instructions (Signed)
Medication Instructions:  No changes  If you need a refill on your cardiac medications before your next appointment, please call your pharmacy.   Lab work: None ordered If you have labs (blood work) drawn today and your tests are completely normal, you will receive your results only by: [place] (if you have MyChart) OR A paper copy in the mail If you have any lab test that is abnormal or we need to change your treatment, we will call you to review the results.  Testing/Procedures: None ordered  Follow-Up: At [facility], you and your health needs are our priority.  As part of our continuing mission to provide you with exceptional heart care, we have created designated Provider Care Teams.  These Care Teams include your primary Cardiologist (physician) and Advanced Practice Providers (APPs -  Physician Assistants and Nurse Practitioners) who all work together to provide you with the care you need, when you need it.  We recommend signing up for the patient portal called "MyChart".  Sign up information is provided on this After Visit Summary.  MyChart is used to connect with patients for Virtual Visits (Telemedicine).  Patients are able to view lab/test results, encounter notes, upcoming appointments, etc.  Non-urgent messages can be sent to your provider as well.   To learn more about what you can do with MyChart, go to [email or web address].    Your next appointment:   12 month(s)  The format for your next appointment:   In Person  Provider:   Sanda Klein, MD   Remote monitoring is used to monitor your pacemaker from home. This monitoring reduces the number of office visits required to check your device to one time per year. It allows [place] to keep an eye on the functioning of your device to ensure it is working properly. You are scheduled for a device check from home on 09/19/19. You may send your transmission at any time that day. If you have a wireless device, the  transmission will be sent automatically.

## 2019-09-17 DIAGNOSIS — R3912 Poor urinary stream: Secondary | ICD-10-CM | POA: Diagnosis not present

## 2019-09-17 DIAGNOSIS — R338 Other retention of urine: Secondary | ICD-10-CM | POA: Diagnosis not present

## 2019-09-26 ENCOUNTER — Telehealth: Payer: Self-pay | Admitting: Cardiovascular Disease

## 2019-09-26 NOTE — Telephone Encounter (Signed)
New message      Patient realized that he forgot to send a transmission early April.  He is due to send another one early may.  Do you want him to send it today or wait until may.  Please call today if possible.

## 2019-09-26 NOTE — Telephone Encounter (Signed)
Asked patient to go ahead and send transmission  Casey Marshall, NP 09/26/2019 2:39 PM

## 2019-09-27 ENCOUNTER — Ambulatory Visit (INDEPENDENT_AMBULATORY_CARE_PROVIDER_SITE_OTHER): Payer: Medicare Other | Admitting: *Deleted

## 2019-09-27 ENCOUNTER — Encounter (HOSPITAL_COMMUNITY): Payer: Self-pay | Admitting: Emergency Medicine

## 2019-09-27 ENCOUNTER — Inpatient Hospital Stay (HOSPITAL_COMMUNITY)
Admission: EM | Admit: 2019-09-27 | Discharge: 2019-11-15 | DRG: 003 | Disposition: A | Discharge status: Skilled Nursing Facility | Payer: Medicare Other | Attending: Neurological Surgery | Admitting: Neurological Surgery

## 2019-09-27 ENCOUNTER — Emergency Department (HOSPITAL_COMMUNITY): Payer: Medicare Other

## 2019-09-27 ENCOUNTER — Other Ambulatory Visit: Payer: Self-pay

## 2019-09-27 DIAGNOSIS — Z4682 Encounter for fitting and adjustment of non-vascular catheter: Secondary | ICD-10-CM | POA: Diagnosis not present

## 2019-09-27 DIAGNOSIS — J189 Pneumonia, unspecified organism: Secondary | ICD-10-CM

## 2019-09-27 DIAGNOSIS — R2681 Unsteadiness on feet: Secondary | ICD-10-CM | POA: Diagnosis not present

## 2019-09-27 DIAGNOSIS — N319 Neuromuscular dysfunction of bladder, unspecified: Secondary | ICD-10-CM | POA: Diagnosis not present

## 2019-09-27 DIAGNOSIS — E860 Dehydration: Secondary | ICD-10-CM | POA: Diagnosis not present

## 2019-09-27 DIAGNOSIS — I472 Ventricular tachycardia: Secondary | ICD-10-CM | POA: Diagnosis present

## 2019-09-27 DIAGNOSIS — I252 Old myocardial infarction: Secondary | ICD-10-CM

## 2019-09-27 DIAGNOSIS — R1310 Dysphagia, unspecified: Secondary | ICD-10-CM | POA: Diagnosis present

## 2019-09-27 DIAGNOSIS — R339 Retention of urine, unspecified: Secondary | ICD-10-CM | POA: Diagnosis present

## 2019-09-27 DIAGNOSIS — Z87891 Personal history of nicotine dependence: Secondary | ICD-10-CM

## 2019-09-27 DIAGNOSIS — R296 Repeated falls: Secondary | ICD-10-CM | POA: Diagnosis present

## 2019-09-27 DIAGNOSIS — I609 Nontraumatic subarachnoid hemorrhage, unspecified: Secondary | ICD-10-CM | POA: Diagnosis not present

## 2019-09-27 DIAGNOSIS — G935 Compression of brain: Secondary | ICD-10-CM | POA: Diagnosis not present

## 2019-09-27 DIAGNOSIS — J181 Lobar pneumonia, unspecified organism: Secondary | ICD-10-CM | POA: Diagnosis not present

## 2019-09-27 DIAGNOSIS — R918 Other nonspecific abnormal finding of lung field: Secondary | ICD-10-CM | POA: Diagnosis not present

## 2019-09-27 DIAGNOSIS — G9761 Postprocedural hematoma of a nervous system organ or structure following a nervous system procedure: Secondary | ICD-10-CM | POA: Diagnosis not present

## 2019-09-27 DIAGNOSIS — S299XXA Unspecified injury of thorax, initial encounter: Secondary | ICD-10-CM | POA: Diagnosis not present

## 2019-09-27 DIAGNOSIS — M47812 Spondylosis without myelopathy or radiculopathy, cervical region: Secondary | ICD-10-CM | POA: Diagnosis not present

## 2019-09-27 DIAGNOSIS — I442 Atrioventricular block, complete: Secondary | ICD-10-CM | POA: Diagnosis not present

## 2019-09-27 DIAGNOSIS — S40811A Abrasion of right upper arm, initial encounter: Secondary | ICD-10-CM | POA: Diagnosis present

## 2019-09-27 DIAGNOSIS — D72829 Elevated white blood cell count, unspecified: Secondary | ICD-10-CM

## 2019-09-27 DIAGNOSIS — G9389 Other specified disorders of brain: Secondary | ICD-10-CM | POA: Diagnosis not present

## 2019-09-27 DIAGNOSIS — T380X5A Adverse effect of glucocorticoids and synthetic analogues, initial encounter: Secondary | ICD-10-CM | POA: Diagnosis not present

## 2019-09-27 DIAGNOSIS — Z20822 Contact with and (suspected) exposure to covid-19: Secondary | ICD-10-CM | POA: Diagnosis present

## 2019-09-27 DIAGNOSIS — I615 Nontraumatic intracerebral hemorrhage, intraventricular: Secondary | ICD-10-CM | POA: Diagnosis not present

## 2019-09-27 DIAGNOSIS — W19XXXA Unspecified fall, initial encounter: Secondary | ICD-10-CM | POA: Diagnosis present

## 2019-09-27 DIAGNOSIS — D332 Benign neoplasm of brain, unspecified: Secondary | ICD-10-CM | POA: Diagnosis not present

## 2019-09-27 DIAGNOSIS — R0902 Hypoxemia: Secondary | ICD-10-CM

## 2019-09-27 DIAGNOSIS — S0990XA Unspecified injury of head, initial encounter: Secondary | ICD-10-CM | POA: Diagnosis not present

## 2019-09-27 DIAGNOSIS — Z8674 Personal history of sudden cardiac arrest: Secondary | ICD-10-CM

## 2019-09-27 DIAGNOSIS — Z23 Encounter for immunization: Secondary | ICD-10-CM | POA: Diagnosis not present

## 2019-09-27 DIAGNOSIS — I4901 Ventricular fibrillation: Secondary | ICD-10-CM | POA: Diagnosis present

## 2019-09-27 DIAGNOSIS — J90 Pleural effusion, not elsewhere classified: Secondary | ICD-10-CM | POA: Diagnosis not present

## 2019-09-27 DIAGNOSIS — J9690 Respiratory failure, unspecified, unspecified whether with hypoxia or hypercapnia: Secondary | ICD-10-CM | POA: Diagnosis not present

## 2019-09-27 DIAGNOSIS — G911 Obstructive hydrocephalus: Secondary | ICD-10-CM | POA: Diagnosis not present

## 2019-09-27 DIAGNOSIS — I214 Non-ST elevation (NSTEMI) myocardial infarction: Secondary | ICD-10-CM | POA: Diagnosis not present

## 2019-09-27 DIAGNOSIS — D333 Benign neoplasm of cranial nerves: Principal | ICD-10-CM | POA: Diagnosis present

## 2019-09-27 DIAGNOSIS — H1089 Other conjunctivitis: Secondary | ICD-10-CM | POA: Diagnosis not present

## 2019-09-27 DIAGNOSIS — R739 Hyperglycemia, unspecified: Secondary | ICD-10-CM | POA: Diagnosis not present

## 2019-09-27 DIAGNOSIS — H1820 Unspecified corneal edema: Secondary | ICD-10-CM | POA: Diagnosis not present

## 2019-09-27 DIAGNOSIS — Z930 Tracheostomy status: Secondary | ICD-10-CM

## 2019-09-27 DIAGNOSIS — Z9911 Dependence on respirator [ventilator] status: Secondary | ICD-10-CM | POA: Diagnosis not present

## 2019-09-27 DIAGNOSIS — J9621 Acute and chronic respiratory failure with hypoxia: Secondary | ICD-10-CM | POA: Diagnosis not present

## 2019-09-27 DIAGNOSIS — D443 Neoplasm of uncertain behavior of pituitary gland: Secondary | ICD-10-CM | POA: Diagnosis not present

## 2019-09-27 DIAGNOSIS — I10 Essential (primary) hypertension: Secondary | ICD-10-CM | POA: Diagnosis present

## 2019-09-27 DIAGNOSIS — E870 Hyperosmolality and hypernatremia: Secondary | ICD-10-CM | POA: Diagnosis not present

## 2019-09-27 DIAGNOSIS — J690 Pneumonitis due to inhalation of food and vomit: Secondary | ICD-10-CM | POA: Diagnosis not present

## 2019-09-27 DIAGNOSIS — M542 Cervicalgia: Secondary | ICD-10-CM | POA: Diagnosis not present

## 2019-09-27 DIAGNOSIS — I619 Nontraumatic intracerebral hemorrhage, unspecified: Secondary | ICD-10-CM | POA: Diagnosis not present

## 2019-09-27 DIAGNOSIS — E43 Unspecified severe protein-calorie malnutrition: Secondary | ICD-10-CM | POA: Diagnosis not present

## 2019-09-27 DIAGNOSIS — Z09 Encounter for follow-up examination after completed treatment for conditions other than malignant neoplasm: Secondary | ICD-10-CM

## 2019-09-27 DIAGNOSIS — R279 Unspecified lack of coordination: Secondary | ICD-10-CM | POA: Diagnosis not present

## 2019-09-27 DIAGNOSIS — Z950 Presence of cardiac pacemaker: Secondary | ICD-10-CM

## 2019-09-27 DIAGNOSIS — D7589 Other specified diseases of blood and blood-forming organs: Secondary | ICD-10-CM | POA: Diagnosis not present

## 2019-09-27 DIAGNOSIS — D62 Acute posthemorrhagic anemia: Secondary | ICD-10-CM | POA: Diagnosis not present

## 2019-09-27 DIAGNOSIS — Z681 Body mass index (BMI) 19 or less, adult: Secondary | ICD-10-CM | POA: Diagnosis not present

## 2019-09-27 DIAGNOSIS — J302 Other seasonal allergic rhinitis: Secondary | ICD-10-CM | POA: Diagnosis present

## 2019-09-27 DIAGNOSIS — D496 Neoplasm of unspecified behavior of brain: Secondary | ICD-10-CM | POA: Diagnosis not present

## 2019-09-27 DIAGNOSIS — R270 Ataxia, unspecified: Secondary | ICD-10-CM | POA: Diagnosis present

## 2019-09-27 DIAGNOSIS — H538 Other visual disturbances: Secondary | ICD-10-CM | POA: Diagnosis not present

## 2019-09-27 DIAGNOSIS — J9601 Acute respiratory failure with hypoxia: Secondary | ICD-10-CM | POA: Diagnosis not present

## 2019-09-27 DIAGNOSIS — J15211 Pneumonia due to Methicillin susceptible Staphylococcus aureus: Secondary | ICD-10-CM | POA: Diagnosis not present

## 2019-09-27 DIAGNOSIS — I159 Secondary hypertension, unspecified: Secondary | ICD-10-CM | POA: Diagnosis not present

## 2019-09-27 DIAGNOSIS — Z0189 Encounter for other specified special examinations: Secondary | ICD-10-CM

## 2019-09-27 DIAGNOSIS — J9600 Acute respiratory failure, unspecified whether with hypoxia or hypercapnia: Secondary | ICD-10-CM | POA: Diagnosis not present

## 2019-09-27 DIAGNOSIS — Z79899 Other long term (current) drug therapy: Secondary | ICD-10-CM

## 2019-09-27 DIAGNOSIS — G9751 Postprocedural hemorrhage and hematoma of a nervous system organ or structure following a nervous system procedure: Secondary | ICD-10-CM | POA: Diagnosis not present

## 2019-09-27 DIAGNOSIS — Z8249 Family history of ischemic heart disease and other diseases of the circulatory system: Secondary | ICD-10-CM

## 2019-09-27 DIAGNOSIS — R42 Dizziness and giddiness: Secondary | ICD-10-CM | POA: Diagnosis not present

## 2019-09-27 DIAGNOSIS — H4921 Sixth [abducent] nerve palsy, right eye: Secondary | ICD-10-CM | POA: Diagnosis present

## 2019-09-27 DIAGNOSIS — G939 Disorder of brain, unspecified: Secondary | ICD-10-CM | POA: Diagnosis not present

## 2019-09-27 DIAGNOSIS — Z743 Need for continuous supervision: Secondary | ICD-10-CM | POA: Diagnosis not present

## 2019-09-27 DIAGNOSIS — R627 Adult failure to thrive: Secondary | ICD-10-CM | POA: Diagnosis present

## 2019-09-27 LAB — URINALYSIS, ROUTINE W REFLEX MICROSCOPIC
Bacteria, UA: NONE SEEN
Bilirubin Urine: NEGATIVE
Glucose, UA: NEGATIVE mg/dL
Hgb urine dipstick: NEGATIVE
Ketones, ur: NEGATIVE mg/dL
Leukocytes,Ua: NEGATIVE
Nitrite: POSITIVE — AB
Protein, ur: NEGATIVE mg/dL
Specific Gravity, Urine: 1.012 (ref 1.005–1.030)
pH: 8 (ref 5.0–8.0)

## 2019-09-27 LAB — CUP PACEART REMOTE DEVICE CHECK
Battery Voltage: 2.85 V
Brady Statistic AP VP Percent: 47.06 %
Brady Statistic AP VS Percent: 0 %
Brady Statistic AS VP Percent: 52.84 %
Brady Statistic AS VS Percent: 0.1 %
Brady Statistic RA Percent Paced: 46.78 %
Brady Statistic RV Percent Paced: 99.5 %
Date Time Interrogation Session: 20210408154728
Implantable Lead Implant Date: 20130125
Implantable Lead Implant Date: 20130125
Implantable Lead Location: 753859
Implantable Lead Location: 753860
Implantable Lead Model: 5076
Implantable Pulse Generator Implant Date: 20130125
Lead Channel Impedance Value: 400 Ohm
Lead Channel Impedance Value: 440 Ohm
Lead Channel Sensing Intrinsic Amplitude: 3.092 mV
Lead Channel Setting Pacing Amplitude: 2 V
Lead Channel Setting Pacing Amplitude: 2.5 V
Lead Channel Setting Pacing Pulse Width: 0.4 ms
Lead Channel Setting Sensing Sensitivity: 2.1 mV

## 2019-09-27 LAB — CBC WITH DIFFERENTIAL/PLATELET
Abs Immature Granulocytes: 0.03 10*3/uL (ref 0.00–0.07)
Basophils Absolute: 0 10*3/uL (ref 0.0–0.1)
Basophils Relative: 0 %
Eosinophils Absolute: 0 10*3/uL (ref 0.0–0.5)
Eosinophils Relative: 0 %
HCT: 38.6 % — ABNORMAL LOW (ref 39.0–52.0)
Hemoglobin: 12.8 g/dL — ABNORMAL LOW (ref 13.0–17.0)
Immature Granulocytes: 0 %
Lymphocytes Relative: 20 %
Lymphs Abs: 1.4 10*3/uL (ref 0.7–4.0)
MCH: 33.7 pg (ref 26.0–34.0)
MCHC: 33.2 g/dL (ref 30.0–36.0)
MCV: 101.6 fL — ABNORMAL HIGH (ref 80.0–100.0)
Monocytes Absolute: 0.6 10*3/uL (ref 0.1–1.0)
Monocytes Relative: 9 %
Neutro Abs: 4.9 10*3/uL (ref 1.7–7.7)
Neutrophils Relative %: 71 %
Platelets: 198 10*3/uL (ref 150–400)
RBC: 3.8 MIL/uL — ABNORMAL LOW (ref 4.22–5.81)
RDW: 13.9 % (ref 11.5–15.5)
WBC: 7 10*3/uL (ref 4.0–10.5)
nRBC: 0 % (ref 0.0–0.2)

## 2019-09-27 LAB — HIV ANTIBODY (ROUTINE TESTING W REFLEX): HIV Screen 4th Generation wRfx: NONREACTIVE

## 2019-09-27 LAB — COMPREHENSIVE METABOLIC PANEL
ALT: 25 U/L (ref 0–44)
AST: 18 U/L (ref 15–41)
Albumin: 3.7 g/dL (ref 3.5–5.0)
Alkaline Phosphatase: 52 U/L (ref 38–126)
Anion gap: 9 (ref 5–15)
BUN: 14 mg/dL (ref 8–23)
CO2: 30 mmol/L (ref 22–32)
Calcium: 8.5 mg/dL — ABNORMAL LOW (ref 8.9–10.3)
Chloride: 101 mmol/L (ref 98–111)
Creatinine, Ser: 0.62 mg/dL (ref 0.61–1.24)
GFR calc Af Amer: >60 mL/min (ref >60)
GFR calc non Af Amer: >60 mL/min (ref >60)
Glucose, Bld: 91 mg/dL (ref 70–99)
Potassium: 4.3 mmol/L (ref 3.5–5.1)
Sodium: 140 mmol/L (ref 135–145)
Total Bilirubin: 0.9 mg/dL (ref 0.3–1.2)
Total Protein: 6.6 g/dL (ref 6.5–8.1)

## 2019-09-27 LAB — RAPID URINE DRUG SCREEN, HOSP PERFORMED
Amphetamines: NOT DETECTED
Barbiturates: NOT DETECTED
Benzodiazepines: NOT DETECTED
Cocaine: NOT DETECTED
Opiates: NOT DETECTED
Tetrahydrocannabinol: NOT DETECTED

## 2019-09-27 LAB — CREATININE, SERUM
Creatinine, Ser: 0.69 mg/dL (ref 0.61–1.24)
GFR calc Af Amer: >60 mL/min (ref >60)
GFR calc non Af Amer: >60 mL/min (ref >60)

## 2019-09-27 LAB — CBC
HCT: 40.9 % (ref 39.0–52.0)
Hemoglobin: 13.5 g/dL (ref 13.0–17.0)
MCH: 32.4 pg (ref 26.0–34.0)
MCHC: 33 g/dL (ref 30.0–36.0)
MCV: 98.1 fL (ref 80.0–100.0)
Platelets: 224 10*3/uL (ref 150–400)
RBC: 4.17 MIL/uL — ABNORMAL LOW (ref 4.22–5.81)
RDW: 13.7 % (ref 11.5–15.5)
WBC: 7.9 10*3/uL (ref 4.0–10.5)
nRBC: 0 % (ref 0.0–0.2)

## 2019-09-27 LAB — ETHANOL: Alcohol, Ethyl (B): <10 mg/dL (ref <10)

## 2019-09-27 MED ORDER — VITAMIN B-12 1000 MCG PO TABS
2000.0000 ug | ORAL_TABLET | Freq: Every day | ORAL | Status: DC
  Administered 2019-09-27 – 2019-10-10 (×13): 2000 ug via ORAL
  Filled 2019-09-27 (×13): qty 2

## 2019-09-27 MED ORDER — SODIUM CHLORIDE 0.9 % IV SOLN: INTRAVENOUS | Status: DC

## 2019-09-27 MED ORDER — ADULT MULTIVITAMIN W/MINERALS CH
1.0000 | ORAL_TABLET | Freq: Every day | ORAL | Status: DC
  Administered 2019-09-27 – 2019-10-10 (×13): 1 via ORAL
  Filled 2019-09-27 (×13): qty 1

## 2019-09-27 MED ORDER — TETANUS-DIPHTH-ACELL PERTUSSIS 5-2.5-18.5 LF-MCG/0.5 IM SUSP
0.5000 mL | Freq: Once | INTRAMUSCULAR | Status: AC
  Administered 2019-09-27: 0.5 mL via INTRAMUSCULAR
  Filled 2019-09-27: qty 0.5

## 2019-09-27 MED ORDER — DEXAMETHASONE SODIUM PHOSPHATE 4 MG/ML IJ SOLN
4.0000 mg | Freq: Four times a day (QID) | INTRAMUSCULAR | Status: DC
  Administered 2019-09-27 – 2019-10-07 (×39): 4 mg via INTRAVENOUS
  Filled 2019-09-27 (×40): qty 1

## 2019-09-27 MED ORDER — HEPARIN SODIUM (PORCINE) 5000 UNIT/ML IJ SOLN
5000.0000 [IU] | Freq: Three times a day (TID) | INTRAMUSCULAR | Status: DC
  Administered 2019-09-27 – 2019-10-06 (×26): 5000 [IU] via SUBCUTANEOUS
  Filled 2019-09-27 (×30): qty 1

## 2019-09-27 MED ORDER — ONDANSETRON HCL 4 MG/2ML IJ SOLN
4.0000 mg | Freq: Four times a day (QID) | INTRAMUSCULAR | Status: DC | PRN
  Administered 2019-10-08: 4 mg via INTRAVENOUS
  Filled 2019-09-27: qty 2

## 2019-09-27 MED ORDER — VITAMIN D 25 MCG (1000 UNIT) PO TABS
4000.0000 [IU] | ORAL_TABLET | Freq: Every day | ORAL | Status: DC
  Administered 2019-09-27 – 2019-10-10 (×13): 4000 [IU] via ORAL
  Filled 2019-09-27 (×13): qty 4

## 2019-09-27 MED ORDER — ONDANSETRON HCL 4 MG PO TABS: 4.0000 mg | ORAL_TABLET | Freq: Four times a day (QID) | ORAL | Status: DC | PRN

## 2019-09-27 NOTE — ED Notes (Signed)
Pt transported to CT ?

## 2019-09-27 NOTE — H&P (Addendum)
History and Physical        Hospital Admission Note Date: 09/27/2019  Patient name: Casey Richard Medical record number: [id number] Date of birth: [birth year] Age: 64 y.o. Gender: male  PCP: Benito Mccreedy, MD    Patient coming from: Home  I have reviewed all records in the [facility].    Chief Complaint:  Dizziness, falls, memory loss, gait instability for the last few months, worsening  HPI: Patient is a 64 year old male with history of complete heart block status post neck pacemaker, (had a bradycardia, V. fib cardiac arrest with complete heart block in 2013, primary cardiologist, Dr. Sallyanne Kuster) presented with dizziness, falls, memory issues in the last few months which has been progressively worsening.  His wife passed away a year ago and has been dealing with the grief and taking care of his kids, hence he had been putting off his symptoms.  Per patient, he has been having blurred vision, intermittent dizziness, has been forgetting things, recently has been falling a lot.  Last fall was last night when he hit his head.  Patient reported that he stood up, got dizzy and fell down and hit his head, nose, no loss of consciousness.  He had abrasion to his right arm, denies any shortness of breath or pain.  Also reported some neck pain since waking up this a.m. and intermittent headaches over the last week.  Patient has been walking with a cane. Also reported loss of appetite and 40 pounds weight loss in the last 1 year.   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  No  acute cervical spine fracture, traumatic subluxation or instability CBC, BMP unremarkable, hemoglobin 12.8 EDP, discussed with neurosurgery, Dr. Marcello Moores, recommended patient to be started on IV Decadron 4 mg every 6 hours, MRI of the brain and will be evaluated by neurosurgery for further work-up  Review of Systems: Positives marked in 'bold' Constitutional: Denies fever, chills, diaphoresis, poor appetite and fatigue.  HEENT: Denies photophobia, eye pain, redness, hearing loss, ear pain, congestion, sore throat, rhinorrhea, sneezing, mouth sores, trouble swallowing, neck pain, neck stiffness and tinnitus.   Respiratory: Denies SOB, DOE, cough, chest tightness,  and wheezing.   Cardiovascular: Denies chest pain, palpitations and leg swelling.  Gastrointestinal: Denies nausea, vomiting, abdominal pain, diarrhea, constipation, blood in stool and abdominal distention.  Genitourinary: Denies dysuria, urgency, frequency, hematuria, flank pain and difficulty urinating.  Musculoskeletal: Please see HPI Neurological: Please see HPI Hematological: Denies adenopathy. Easy bruising, personal or family bleeding history  Psychiatric/Behavioral: Denies suicidal ideation, mood changes, confusion, nervousness, sleep disturbance and agitation  Past Medical History: Past Medical History:  Diagnosis Date  . Cardiac arrest ([place])   . CHB (complete heart block) ([place])   . 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 Dysrhythmia 1/13   bradycardia, VF, cardiac arrest/complete heart block with pacer inserted/LOV Dr Sallyanne Kuster  07/21/11 with interrogation and anesthesia guideline order on chart. Chest x ray 1/13 EPIC,, TEE and [facility]  1/13 EPIC  . Memory loss, short term    from fall with heart block 1/13  . Nonsustained ventricular tachycardia ([place]) 02/07/2014  . Presence of permanent cardiac pacemaker 07/15/2011   Medtronic Revo  . Seasonal allergies   . Shortness of breath      Past Surgical History:  Procedure Laterality Date  . CARDIAC CATHETERIZATION  07/09/2011   Normal coronaries  . CYSTOSCOPY WITH URETHRAL DILATATION  08/19/2011   Procedure: CYSTOSCOPY WITH URETHRAL DILATATION;  Surgeon: Dutch Gray, MD;  Location: [facility] ORS;  Service: Urology;  Laterality: N/A;  Balloon Dilation of Urethral Stricture, retro-urethrogram, Suprapubic change   . LEFT HEART CATHETERIZATION WITH CORONARY ANGIOGRAM N/A 07/09/2011   Procedure: LEFT HEART CATHETERIZATION WITH CORONARY ANGIOGRAM;  Surgeon: Jettie Booze, MD;  Location: [facility] CATH LAB;  Service: Cardiovascular;  Laterality: N/A;  . PACEMAKER INSERTION  07/15/11   Medtronic Revo  . PERMANENT PACEMAKER INSERTION Left 07/15/2011   Procedure: PERMANENT PACEMAKER INSERTION;  Surgeon: Sanda Klein, MD;  Location: [facility] CATH LAB;  Service: Cardiovascular;  Laterality: Left;  . TEMPORARY PACEMAKER INSERTION Right 07/09/2011   Procedure: TEMPORARY PACEMAKER INSERTION;  Surgeon: Jettie Booze, MD;  Location: [facility] CATH LAB;  Service: Cardiovascular;  Laterality: Right;    Medications: Prior to Admission medications   Medication Sig Start Date End Date Taking? Authorizing Provider  acetaminophen (TYLENOL) 500 MG tablet Take 1,000 mg by mouth every 6 (six) hours as needed. Pain    Yes [provider]  Cholecalciferol (HM VITAMIN D3) 4000 units CAPS Take 1 capsule by mouth daily.   Yes [provider]  cyanocobalamin 2000 MCG tablet Take 2,000 mcg by mouth daily.   Yes [provider]  Multiple Vitamin (MULITIVITAMIN WITH MINERALS) TABS Take 1 tablet by mouth daily.    Yes [provider]    Allergies:  No Known Allergies  Social History:  reports that he quit smoking about 30 years ago. His smoking use included cigarettes. He has never used smokeless tobacco. He reports that he does not drink alcohol or use drugs.  Family History: Family History  Problem Relation Age of Onset  .  Coronary artery disease Mother        AICD  . COPD Father   . Coronary artery disease Brother     Physical Exam: Blood pressure (!) 159/99, pulse 70, temperature 97.8 F (36.6 C), temperature source Oral, resp. rate 10, SpO2 100 %. General: Alert, awake, oriented x3, in no acute distress, pleasant and cooperative, no dysarthria or aphasia. Eyes: pink conjunctiva,anicteric sclera, pupils equal and reactive to light and accomodation, HEENT: normocephalic, atraumatic, oropharynx clear, dry mucosal membrane Neck: supple, no masses or lymphadenopathy, no goiter, no bruits, no JVD CVS: Regular rate and rhythm, without murmurs, rubs or gallops. No lower extremity edema Resp : Clear to auscultation bilaterally, no wheezing, rales or rhonchi. GI : Soft, nontender, nondistended, positive bowel sounds, no masses. No hepatomegaly. No hernia.  Musculoskeletal: No clubbing or cyanosis, positive pedal pulses. No contracture. ROM intact  Neuro: Decreased peripheral vision, right eye with nystagmus, normal strength 5/5 bilateral upper extremities, lower extremities 4/5.  Dysmetria with finger-to-nose and heel-to-shin on the right. Psych: alert and oriented x 3, normal mood and affect Skin: Abrasion noted on the right arm, redness on his nose due to recent fall   LABS on Admission: I have personally reviewed all  the labs and imagings below    Basic Metabolic Panel: Recent Labs  Lab 09/27/19 1337  NA 140  K 4.3  CL 101  CO2 30  GLUCOSE 91  BUN 14  CREATININE 0.62  CALCIUM 8.5*   Liver Function Tests: Recent Labs  Lab 09/27/19 1337  AST 18  ALT 25  ALKPHOS 52  BILITOT 0.9  PROT 6.6  ALBUMIN 3.7   No results for input(s): LIPASE, AMYLASE in the last 168 hours. No results for input(s): AMMONIA in the last 168 hours. CBC: Recent Labs  Lab 09/27/19 1337  WBC 7.0  NEUTROABS 4.9  HGB 12.8*  HCT 38.6*  MCV 101.6*  PLT 198   Cardiac Enzymes: No results for input(s): CKTOTAL, CKMB,  CKMBINDEX, TROPONINI in the last 168 hours. BNP: Invalid input(s): POCBNP CBG: No results for input(s): GLUCAP in the last 168 hours.  Radiological Exams on Admission:  DG Ribs Unilateral W/Chest Right  Result Date: 09/27/2019 CLINICAL DATA:  Right-sided rib pain following fall, initial encounter EXAM: RIGHT RIBS AND CHEST - 3+ VIEW COMPARISON:  07/16/2011 FINDINGS: Cardiac shadow is within normal limits. Pacing device is again seen and stable. The lungs are clear. No pneumothorax is noted. No acute rib fracture is seen. No other focal abnormality is noted. IMPRESSION: No rib fractures identified. Electronically Signed   By: Inez Catalina M.D.   On: 09/27/2019 13:54   CT Head Wo Contrast  Result Date: 09/27/2019 CLINICAL DATA:  Fall last night. Blurred vision with facial numbness and neck pain. History of stroke. EXAM: CT HEAD WITHOUT CONTRAST CT CERVICAL SPINE WITHOUT CONTRAST TECHNIQUE: Multidetector CT imaging of the head and cervical spine was performed following the standard protocol without intravenous contrast. Multiplanar CT image reconstructions of the cervical spine were also generated. COMPARISON:  CT head and cervical spine 07/09/2011. MRI head 02/15/2012. FINDINGS: CT HEAD FINDINGS Brain: There is a new large low-density mass at the right cerebellar pontine angle which measures up to 4.6 x 4.0 cm on image 8/3. This is well-circumscribed and likely cystic. There is mass effect on the pons and cerebellum with displacement of the 4th ventricle to the left. However, no resulting hydrocephalus. There is no evidence of acute intracranial hemorrhage, brain edema or other extra-axial fluid collection. No evidence of acute stroke. Vascular:  No hyperdense vessel identified. Skull: Negative for fracture or focal lesion. Sinuses/Orbits: Mild ethmoid sinus mucosal thickening. The additional visualized paranasal sinuses, mastoid air cells and middle ears are clear. No significant orbital findings. Other:  None. CT CERVICAL SPINE FINDINGS Alignment: Mild reversal of the usual cervical lordosis without focal angulation or listhesis. Skull base and vertebrae: No evidence of acute cervical spine fracture or traumatic subluxation. Soft tissues and spinal canal: No prevertebral fluid or swelling. No visible canal hematoma. Disc levels: Mildly progressive spondylosis with uncinate spurring and endplate osteophytes at C5-6 and C6-7. No high-grade spinal stenosis. Upper chest: Mild paraseptal emphysematous changes and scarring at both lung apices. Right subclavian pacemaker noted. Other: Ossification of the ligamentum nuchae. IMPRESSION: 1. No acute intracranial or calvarial findings. 2. 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 Cystic neoplasm less likely. Recommend further evaluation with MRI of the brain without and with contrast (if possible; the patient has a pacemaker) and non emergent neuro surgical consultation. 3. No  evidence of acute cervical spine fracture, traumatic subluxation or static signs of instability. 4. Mildly progressive cervical spondylosis. Electronically Signed   By: Richardean Sale M.D.   On: 09/27/2019 14:15   CT Cervical Spine Wo Contrast  Result Date: 09/27/2019 CLINICAL DATA:  Fall last night. Blurred vision with facial numbness and neck pain. History of stroke. EXAM: CT HEAD WITHOUT CONTRAST CT CERVICAL SPINE WITHOUT CONTRAST TECHNIQUE: Multidetector CT imaging of the head and cervical spine was performed following the standard protocol without intravenous contrast. Multiplanar CT image reconstructions of the cervical spine were also generated. COMPARISON:  CT head and cervical spine 07/09/2011. MRI head 02/15/2012. FINDINGS: CT HEAD FINDINGS Brain: There is a new large low-density mass at  the right cerebellar pontine angle which measures up to 4.6 x 4.0 cm on image 8/3. This is well-circumscribed and likely cystic. There is mass effect on the pons and cerebellum with displacement of the 4th ventricle to the left. However, no resulting hydrocephalus. There is no evidence of acute intracranial hemorrhage, brain edema or other extra-axial fluid collection. No evidence of acute stroke. Vascular:  No hyperdense vessel identified. Skull: Negative for fracture or focal lesion. Sinuses/Orbits: Mild ethmoid sinus mucosal thickening. The additional visualized paranasal sinuses, mastoid air cells and middle ears are clear. No significant orbital findings. Other: None. CT CERVICAL SPINE FINDINGS Alignment: Mild reversal of the usual cervical lordosis without focal angulation or listhesis. Skull base and vertebrae: No evidence of acute cervical spine fracture or traumatic subluxation. Soft tissues and spinal canal: No prevertebral fluid or swelling. No visible canal hematoma. Disc levels: Mildly progressive spondylosis with uncinate spurring and endplate osteophytes at C5-6 and C6-7. No high-grade spinal stenosis. Upper chest: Mild paraseptal emphysematous changes and scarring at both lung apices. Right subclavian pacemaker noted. Other: Ossification of the ligamentum nuchae. IMPRESSION: 1. No acute intracranial or calvarial findings. 2. 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 Cystic neoplasm less likely. Recommend further evaluation with MRI of the brain without and with contrast (if possible; the patient has a pacemaker) and non emergent neuro surgical consultation. 3. No evidence of acute cervical spine fracture, traumatic subluxation or static signs of instability. 4. Mildly progressive cervical  spondylosis. Electronically Signed   By: Richardean Sale M.D.   On: 09/27/2019 14:15   CUP PACEART REMOTE DEVICE CHECK  Result Date: 09/27/2019 Scheduled remote reviewed. Normal device function.  Battery: 2.85 V (RRT: 2.81 V). Will continue monthly remotes for battery monitoring. [place] Scheduled remote reviewed. Normal device function.  Battery: 2.85 V (RRT: 2.81 V). Will continue monthly remotes for battery monitoring. LC     EKG: Independently reviewed.  Rate 70 with paced rhythm   Assessment/Plan Principal Problem: Neurological changes with gait instability, memory loss, falls, intermittent dizziness likely secondary to brain mass -CT head findings as above.  Patient was having the symptoms for many months however did not seek medical attention, now progressively worsening. -Neurosurgery (Dr. Marcello Moores ) consulted by EDP, recommended starting IV Decadron, started on 4 mg every 6 hours - Neurosurgery recommended MRI with and without contrast, patient has pacemaker.  Will call [employer].  -Fall precautions Addendum 5:40pm I called [employer] # [phone], spoke with customer service who will notify the rep when patient is in [place] to reprogram the pacemaker for MRI.  [employer] rep may need to be notified once MRI brain is scheduled.  Addendum: 5:50 PM Received a call from [employer] rep, recommended cardiology EP consult to put in order for reprogramming of the pacemaker and requested MRI department to call the [employer] rep once the MRI brain is scheduled at [facility]. Called cardiology, waiting for response   Active Problems: Dehydration -Appears to be dehydrated, placed on IV fluids, states has not had much to eat in the last 24 hours due to loss of appetite  History of bradycardia, V. fib cardiac arrest in 2013 -Status post pacemaker, follows Dr Sallyanne Kuster, last seen on 09/16/2019 -Patient has MRI conditional pacing system, can have MRI of the brain with right reprogramming, will  call [employer] -Pacemaker was interrogated, normal device function however generator is approaching RRT.  History of urinary problems including urinary retention -Patient had significant ureteral injury during the syncopal event/cardiac arrest, has had issues with urinary retention, follows Dr. Alinda Money in the urology clinic   DVT prophylaxis: Heparin subcu  CODE STATUS: Full CODE STATUS  Consults called: Neurosurgery consulted by EDP  Family Communication: Admission, patients condition and plan of care including tests being ordered have been discussed with the patient who indicates understanding and agree with the plan and Code Status  Admission status:   The medical decision making on this patient was of high complexity and the patient is at high risk for clinical deterioration, therefore this is a level 3 admission.  Severity of Illness:      The appropriate patient status for this patient is INPATIENT. Inpatient status is judged to be reasonable and necessary in order to provide the required intensity of service to ensure the patient's safety. The patient's presenting symptoms, physical exam findings, and initial radiographic and laboratory data in the context of their chronic comorbidities is felt to place them at high risk for further clinical deterioration. Furthermore, it is not anticipated that the patient will be medically stable for discharge from the hospital within 2 midnights of admission. The following factors support the patient status of inpatient.   " The patient's presenting symptoms include intermittent dizziness with falls, gait instability, memory loss " The worrisome physical exam findings include cerebellar signs " The initial radiographic and laboratory data are worrisome because of large cystic brain mass " The chronic co-morbidities include history of complete heart block pacemaker dependent   * I certify that at the point of admission it is my clinical judgment  that the patient will require inpatient hospital care spanning beyond 2 midnights from the point of admission due to high intensity of service, high risk for further deterioration and high frequency of surveillance required.*    Time Spent on Admission: 74mins      Raelle Chambers M.D. [facility] 09/27/2019, 5:08 PM

## 2019-09-27 NOTE — ED Notes (Signed)
Provider at bedside

## 2019-09-27 NOTE — ED Triage Notes (Signed)
Per [facility] pt from home for dizziness for couple weeks. Also c/o cloudy urine for a couple days. Is self cathing at home and had recurrent UTIs. Pt has puffiness around eyes that pt has been dealing with. Pt has been dealing with his wife's death year ago and not taking care so much of himself as he should. Pt has a pacemaker. Has 18g left AC.

## 2019-09-27 NOTE — ED Triage Notes (Signed)
Pt also fell last night when he hoped up to quickly. Has skin tear on right arm, hurt nose as well. Pt reports that he has blurred vision and can't remember things for months. Reports some facial numbness and neck pains for months as well.

## 2019-09-27 NOTE — ED Notes (Signed)
Called [facility] to give report RN unavailable. Will call again

## 2019-09-27 NOTE — ED Notes (Signed)
Pt returned from xray

## 2019-09-27 NOTE — ED Provider Notes (Signed)
[facility] DEPT Provider Note   CSN: [place]:[id number] Arrival date & time: 09/27/19  1128     History Chief Complaint  Patient presents with  . Dizziness  . Fall    Casey Richard is a 64 y.o. male.  HPI   64 year old male with a history of cardiac arrest, complete heart block s/p pacemaker insertion, CKD, nonsustained V. tach, who presents the emergency department today for evaluation of dizziness and multiple falls.  Patient states that for the last 4 months he has had intermittent episodes of dizziness.  Initially they were just triggered by movement however as of recently the dizziness has become more persistent.  He describes this as vertigo and states it feels like the room is spinning.  He also reports bilateral blurred vision that seems to have worsened over the last few months.  He is right-handed and states that his handwriting is also worse and he feels like he does not have control over his hand movements. He denies any numbness/weakness to the right upper or lower extremity.  Because of his dizziness he has had multiple falls including his most recent fall which occurred last night.  States he stood up got dizzy fell down and hit his head.  He also hit his nose.  He denies any loss of consciousness.  He has some mild pain to the right arm where he has an abrasion.  He also states he hit the right side of his chest wall when he fell though he does not have any significant pain there and does not have any pain with inspiration or shortness of breath.  He also has some neck pain since waking up this a.m.  He is also had intermittent headaches over the last few days.  He had another fall about a week ago.    Additionally reports hearing loss to his right ear, tinnitus, right facial numbness, generalized weakness, decreased appetite and ability to taste food.  He has had fatigue, intermittent chills, constipation and memory loss.  He also states he is  unintentionally lost 40 pounds in the last year.  PCP: Dr. Vergie Living    Past Medical History:  Diagnosis Date  . Cardiac arrest ([place])   . CHB (complete heart block) ([place])   . 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 Dysrhythmia 1/13   bradycardia, VF, cardiac arrest/complete heart block with pacer inserted/LOV Dr Sallyanne Kuster 07/21/11 with interrogation and anesthesia guideline order on chart. Chest x ray 1/13 EPIC,, TEE and [facility]  1/13 EPIC  . Memory loss, short term    from fall with heart block 1/13  . Nonsustained ventricular tachycardia ([place]) 02/07/2014  . Presence of permanent cardiac pacemaker 07/15/2011   Medtronic Revo  . Seasonal allergies   . Shortness of breath     Patient Active Problem List   Diagnosis Date Noted  . Brain mass 09/27/2019  . Paroxysmal atrial tachycardia ([place]) 10/27/2015  . Nonsustained ventricular tachycardia ([place]) 02/07/2014  . CHB (complete heart block) ([place])   . Memory loss, short term   . 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 NSTEMI (non-ST elevated myocardial infarction) ([place]) 07/10/2011  . Ventricular fibrillation ([place]) 07/09/2011  . 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 Complete heart block, requiring external pacing then temp wire pacing. 07/09/2011    Past Surgical History:  Procedure Laterality Date  . CARDIAC CATHETERIZATION  07/09/2011   Normal coronaries  .  CYSTOSCOPY WITH URETHRAL DILATATION  08/19/2011   Procedure: CYSTOSCOPY WITH URETHRAL DILATATION;  Surgeon: Dutch Gray, MD;  Location: [facility] ORS;  Service: Urology;  Laterality: N/A;  Balloon Dilation of Urethral Stricture, retro-urethrogram, Suprapubic change   . LEFT HEART CATHETERIZATION WITH CORONARY ANGIOGRAM N/A 07/09/2011   Procedure: LEFT HEART CATHETERIZATION WITH CORONARY ANGIOGRAM;  Surgeon: Jettie Booze, MD;  Location: [facility] CATH LAB;  Service: Cardiovascular;  Laterality: N/A;  . PACEMAKER INSERTION   07/15/11   Medtronic Revo  . PERMANENT PACEMAKER INSERTION Left 07/15/2011   Procedure: PERMANENT PACEMAKER INSERTION;  Surgeon: Sanda Klein, MD;  Location: [facility] CATH LAB;  Service: Cardiovascular;  Laterality: Left;  . TEMPORARY PACEMAKER INSERTION Right 07/09/2011   Procedure: TEMPORARY PACEMAKER INSERTION;  Surgeon: Jettie Booze, MD;  Location: [facility] CATH LAB;  Service: Cardiovascular;  Laterality: Right;       Family History  Problem Relation Age of Onset  . Coronary artery disease Mother        AICD  . COPD Father   . Coronary artery disease Brother     Social History   Tobacco Use  . Smoking status: Former Smoker    Types: Cigarettes    Quit date: 06/20/1989    Years since quitting: 30.2  . Smokeless tobacco: Never Used  Substance Use Topics  . Alcohol use: No  . Drug use: No    Home Medications Prior to Admission medications   Medication Sig Start Date End Date Taking? Authorizing Provider  acetaminophen (TYLENOL) 500 MG tablet Take 1,000 mg by mouth every 6 (six) hours as needed. Pain    Yes [provider]  Cholecalciferol (HM VITAMIN D3) 4000 units CAPS Take 1 capsule by mouth daily.   Yes [provider]  cyanocobalamin 2000 MCG tablet Take 2,000 mcg by mouth daily.   Yes [provider]  Multiple Vitamin (MULITIVITAMIN WITH MINERALS) TABS Take 1 tablet by mouth daily.    Yes [provider]    Allergies    Patient has no known allergies.  Review of Systems   Review of Systems  Constitutional: Positive for appetite change and fatigue. Negative for chills and fever.  HENT: Negative for ear pain and sore throat.   Eyes: Positive for visual disturbance.  Respiratory: Negative for cough and shortness of breath.   Cardiovascular: Negative for chest pain.  Gastrointestinal: Positive for constipation. Negative for abdominal pain, diarrhea, nausea and vomiting.  Genitourinary: Negative for dysuria and hematuria.    Musculoskeletal: Negative for back pain.  Skin: Negative for rash.  Neurological: Positive for dizziness, numbness and headaches. Negative for weakness.  All other systems reviewed and are negative.   Physical Exam Updated Vital Signs BP (!) 153/94   Pulse 69   Temp 97.8 F (36.6 C) (Oral)   Resp (!) 25   SpO2 99%   Physical Exam Vitals and nursing note reviewed.  Constitutional:      Appearance: He is well-developed.     Comments: Thin appearing  HENT:     Head: Normocephalic and atraumatic.  Eyes:     Extraocular Movements: Extraocular movements intact.     Conjunctiva/sclera: Conjunctivae normal.     Pupils: Pupils are equal, round, and reactive to light.     Comments: Bilateral horizontal nystagmus.  Vertical nystagmus.  Left eye ptosis  Cardiovascular:     Rate and Rhythm: Normal rate and regular rhythm.     Pulses: Normal pulses.     Heart sounds: Normal heart  sounds. No murmur.  Pulmonary:     Effort: Pulmonary effort is normal. No respiratory distress.     Breath sounds: Normal breath sounds. No wheezing, rhonchi or rales.  Abdominal:     General: Bowel sounds are normal.     Palpations: Abdomen is soft.     Tenderness: There is no abdominal tenderness. There is no guarding or rebound.  Musculoskeletal:     Cervical back: Neck supple.  Skin:    General: Skin is warm and dry.  Neurological:     Mental Status: He is alert.     Comments: Mental Status:  Alert, thought content appropriate, able to give a coherent history. Speech fluent without evidence of aphasia. Able to follow 2 step commands without difficulty.  Cranial Nerves:  II: Decreased peripheral vision in all fields of the right eye, peripheral vision grossly intact to the left eye, pupils equal, round, reactive to light III,IV, VI: left eye ptosis, extra-ocular motions intact bilaterally  V,VII: smile symmetric, decreased sensation to the right side of the face VIII: hearing grossly normal to voice   X: uvula elevates symmetrically  XI: bilateral shoulder shrug symmetric and strong XII: midline tongue extension without fassiculations Motor:  Normal tone. 5/5 strength of BUE.  Slightly weaker to the right lower extremity compared to the left. Sensory: light touch normal in all extremities. Cerebellar: Dysmetria with finger-to-nose and heel-to-shin on the right side. Gait: Ataxic gait, leans to the right CV: 2+ DP pulses Negative pronator drift.  Negative Romberg     ED Results / Procedures / Treatments   Labs (all labs ordered are listed, but only abnormal results are displayed) Labs Reviewed  CBC WITH DIFFERENTIAL/PLATELET - Abnormal; Notable for the following components:      Result Value   RBC 3.80 (*)    Hemoglobin 12.8 (*)    HCT 38.6 (*)    MCV 101.6 (*)    All other components within normal limits  COMPREHENSIVE METABOLIC PANEL - Abnormal; Notable for the following components:   Calcium 8.5 (*)    All other components within normal limits  URINALYSIS, ROUTINE W REFLEX MICROSCOPIC - Abnormal; Notable for the following components:   APPearance CLOUDY (*)    Nitrite POSITIVE (*)    All other components within normal limits  URINE CULTURE  RAPID URINE DRUG SCREEN, HOSP PERFORMED  ETHANOL    EKG EKG Interpretation  Date/Time:  Friday September 27 2019 11:54:16 EDT Ventricular Rate:  70 PR Interval:    QRS Duration: 168 QT Interval:  480 QTC Calculation: 518 R Axis:   -89 Text Interpretation: Sinus rhythm Nonspecific IVCD with LAD Left ventricular hypertrophy Inferior infarct, acute (LCx) Anterior infarct, old similar to prior EKG Confirmed by Malvin Johns [phone]) on 09/27/2019 2:38:30 PM   Radiology DG Ribs Unilateral W/Chest Right  Result Date: 09/27/2019 CLINICAL DATA:  Right-sided rib pain following fall, initial encounter EXAM: RIGHT RIBS AND CHEST - 3+ VIEW COMPARISON:  07/16/2011 FINDINGS: Cardiac shadow is within normal limits. Pacing device is again seen  and stable. The lungs are clear. No pneumothorax is noted. No acute rib fracture is seen. No other focal abnormality is noted. IMPRESSION: No rib fractures identified. Electronically Signed   By: Inez Catalina M.D.   On: 09/27/2019 13:54   CT Head Wo Contrast  Result Date: 09/27/2019 CLINICAL DATA:  Fall last night. Blurred vision with facial numbness and neck pain. History of stroke. EXAM: CT HEAD WITHOUT CONTRAST CT CERVICAL SPINE WITHOUT  CONTRAST TECHNIQUE: Multidetector CT imaging of the head and cervical spine was performed following the standard protocol without intravenous contrast. Multiplanar CT image reconstructions of the cervical spine were also generated. COMPARISON:  CT head and cervical spine 07/09/2011. MRI head 02/15/2012. FINDINGS: CT HEAD FINDINGS Brain: There is a new large low-density mass at the right cerebellar pontine angle which measures up to 4.6 x 4.0 cm on image 8/3. This is well-circumscribed and likely cystic. There is mass effect on the pons and cerebellum with displacement of the 4th ventricle to the left. However, no resulting hydrocephalus. There is no evidence of acute intracranial hemorrhage, brain edema or other extra-axial fluid collection. No evidence of acute stroke. Vascular:  No hyperdense vessel identified. Skull: Negative for fracture or focal lesion. Sinuses/Orbits: Mild ethmoid sinus mucosal thickening. The additional visualized paranasal sinuses, mastoid air cells and middle ears are clear. No significant orbital findings. Other: None. CT CERVICAL SPINE FINDINGS Alignment: Mild reversal of the usual cervical lordosis without focal angulation or listhesis. Skull base and vertebrae: No evidence of acute cervical spine fracture or traumatic subluxation. Soft tissues and spinal canal: No prevertebral fluid or swelling. No visible canal hematoma. Disc levels: Mildly progressive spondylosis with uncinate spurring and endplate osteophytes at C5-6 and C6-7. No high-grade  spinal stenosis. Upper chest: Mild paraseptal emphysematous changes and scarring at both lung apices. Right subclavian pacemaker noted. Other: Ossification of the ligamentum nuchae. IMPRESSION: 1. No acute intracranial or calvarial findings. 2. 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 Cystic neoplasm less likely. Recommend further evaluation with MRI of the brain without and with contrast (if possible; the patient has a pacemaker) and non emergent neuro surgical consultation. 3. No evidence of acute cervical spine fracture, traumatic subluxation or static signs of instability. 4. Mildly progressive cervical spondylosis. Electronically Signed   By: Richardean Sale M.D.   On: 09/27/2019 14:15   CT Cervical Spine Wo Contrast  Result Date: 09/27/2019 CLINICAL DATA:  Fall last night. Blurred vision with facial numbness and neck pain. History of stroke. EXAM: CT HEAD WITHOUT CONTRAST CT CERVICAL SPINE WITHOUT CONTRAST TECHNIQUE: Multidetector CT imaging of the head and cervical spine was performed following the standard protocol without intravenous contrast. Multiplanar CT image reconstructions of the cervical spine were also generated. COMPARISON:  CT head and cervical spine 07/09/2011. MRI head 02/15/2012. FINDINGS: CT HEAD FINDINGS Brain: There is a new large low-density mass at the right cerebellar pontine angle which measures up to 4.6 x 4.0 cm on image 8/3. This is well-circumscribed and likely cystic. There is mass effect on the pons and cerebellum with displacement of the 4th ventricle to the left. However, no resulting hydrocephalus. There is no evidence of acute intracranial hemorrhage, brain edema or other extra-axial fluid collection. No evidence of acute stroke. Vascular:  No hyperdense vessel  identified. Skull: Negative for fracture or focal lesion. Sinuses/Orbits: Mild ethmoid sinus mucosal thickening. The additional visualized paranasal sinuses, mastoid air cells and middle ears are clear. No significant orbital findings. Other: None. CT CERVICAL SPINE FINDINGS Alignment: Mild reversal of the usual cervical lordosis without focal angulation or listhesis. Skull base and vertebrae: No evidence of acute cervical spine fracture or traumatic subluxation. Soft tissues and spinal canal: No prevertebral fluid or swelling. No visible canal hematoma. Disc levels: Mildly progressive  spondylosis with uncinate spurring and endplate osteophytes at C5-6 and C6-7. No high-grade spinal stenosis. Upper chest: Mild paraseptal emphysematous changes and scarring at both lung apices. Right subclavian pacemaker noted. Other: Ossification of the ligamentum nuchae. IMPRESSION: 1. No acute intracranial or calvarial findings. 2. 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 Cystic neoplasm less likely. Recommend further evaluation with MRI of the brain without and with contrast (if possible; the patient has a pacemaker) and non emergent neuro surgical consultation. 3. No evidence of acute cervical spine fracture, traumatic subluxation or static signs of instability. 4. Mildly progressive cervical spondylosis. Electronically Signed   By: Richardean Sale M.D.   On: 09/27/2019 14:15   CUP PACEART REMOTE DEVICE CHECK  Result Date: 09/27/2019 Scheduled remote reviewed. Normal device function.  Battery: 2.85 V (RRT: 2.81 V). Will continue monthly remotes for battery monitoring. [place] Scheduled remote reviewed. Normal device function.  Battery: 2.85 V (RRT: 2.81 V). Will continue monthly remotes for battery monitoring.  LC   Procedures Procedures (including critical care time)  Medications Ordered in ED Medications  dexamethasone (DECADRON) injection 4 mg (has no administration in time range)  Tdap (BOOSTRIX) injection 0.5 mL (0.5 mLs Intramuscular Given 09/27/19 1337)    ED Course  I have reviewed the triage vital signs and the nursing notes.  Pertinent labs & imaging results that were available during my care of the patient were reviewed by me and considered in my medical decision making (see chart for details).    MDM Rules/Calculators/A&P                      64 year old male presenting for evaluation of vertigo/dizziness ongoing for the last 4 months.  Is associated with visual changes, memory loss, and multiple falls.  We will obtain labs, CT head/cervical spine.  CBC without leukocytosis, anemia present but stable from prior CMP without gross electrolyte derangement, normal kidney and liver function UA with nitrites, no leukocytes, 0-5 RBCs, 11-20 WBC and no bacteria.  UDS neg ETOH neg  CT head/cervical spine with no acute intracranial or calvarial findings. 2. 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 Cystic neoplasm less likely. Recommend further evaluation with MRI of the brain without and with contrast (if possible; the patient has a pacemaker) and non emergent neuro surgical consultation. 3. No evidence of acute cervical spine fracture, traumatic subluxation or static signs of instability. 4. Mildly progressive cervical spondylosis.  Discussed with MRI tech. [facility] will [facility] due to his pacemaker.   Due that patient being severely symptomatic from the cerebellar lesion, feel he will require admission with neurosurgery eval and expedited w/u & treatment.   3:40 PM  CONSULT with Dr. Marcello Moores with neurosurgery who recommended getting any MRI. He also recommended starting the patient on 4 mg dexamethasone q6. He will consult on the patient.   4:18 PM CONSULT with Dr. Tana Coast with hospitalist service who accepts patient for admission and will transfer to [facility].   Final Clinical Impression(s) / ED Diagnoses Final diagnoses:  Brain mass    Rx / DC Orders  ED Discharge Orders    None       Rodney Booze, [place] 09/27/19 1643    Malvin Johns, MD 09/27/19 1659

## 2019-09-28 DIAGNOSIS — G9389 Other specified disorders of brain: Secondary | ICD-10-CM | POA: Diagnosis not present

## 2019-09-28 DIAGNOSIS — R2681 Unsteadiness on feet: Secondary | ICD-10-CM | POA: Diagnosis not present

## 2019-09-28 LAB — CBC
HCT: 41 % (ref 39.0–52.0)
Hemoglobin: 13.4 g/dL (ref 13.0–17.0)
MCH: 32.2 pg (ref 26.0–34.0)
MCHC: 32.7 g/dL (ref 30.0–36.0)
MCV: 98.6 fL (ref 80.0–100.0)
Platelets: 252 10*3/uL (ref 150–400)
RBC: 4.16 MIL/uL — ABNORMAL LOW (ref 4.22–5.81)
RDW: 13.7 % (ref 11.5–15.5)
WBC: 7.9 10*3/uL (ref 4.0–10.5)
nRBC: 0 % (ref 0.0–0.2)

## 2019-09-28 LAB — BASIC METABOLIC PANEL
Anion gap: 10 (ref 5–15)
BUN: 11 mg/dL (ref 8–23)
CO2: 29 mmol/L (ref 22–32)
Calcium: 9.1 mg/dL (ref 8.9–10.3)
Chloride: 100 mmol/L (ref 98–111)
Creatinine, Ser: 0.71 mg/dL (ref 0.61–1.24)
GFR calc Af Amer: >60 mL/min (ref >60)
GFR calc non Af Amer: >60 mL/min (ref >60)
Glucose, Bld: 134 mg/dL — ABNORMAL HIGH (ref 70–99)
Potassium: 4 mmol/L (ref 3.5–5.1)
Sodium: 139 mmol/L (ref 135–145)

## 2019-09-28 LAB — SARS CORONAVIRUS 2 (TAT 6-24 HRS): SARS Coronavirus 2: NEGATIVE

## 2019-09-28 MED ORDER — SODIUM CHLORIDE 0.9 % IV SOLN: INTRAVENOUS | Status: DC

## 2019-09-28 NOTE — Consult Note (Signed)
Chief Complaint   Chief Complaint  Patient presents with  . Dizziness  . Fall    History of Present Illness  Casey Richard is a 64 y.o. man with cardiac history of heart block s/p pacemaker and VTach who presents with increased coordination and balance difficulties as well as visual difficulties.  These have been progressing for over a year, since his wife died.  He has double vision as well as poor vision.  He also has some difficulty swallowing.  He has lost serviceable hearing in his right ear.  He also feels like his memory is poorer.  Past Medical History   Past Medical History:  Diagnosis Date  . Cardiac arrest ([place])   . CHB (complete heart block) ([place])   . 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 Dysrhythmia 1/13   bradycardia, VF, cardiac arrest/complete heart block with pacer inserted/LOV Dr Sallyanne Kuster 07/21/11 with interrogation and anesthesia guideline order on chart. Chest x ray 1/13 EPIC,, TEE and [facility]  1/13 EPIC  . Memory loss, short term    from fall with heart block 1/13  . Nonsustained ventricular tachycardia ([place]) 02/07/2014  . Presence of permanent cardiac pacemaker 07/15/2011   Medtronic Revo  . Seasonal allergies   . Shortness of breath     Past Surgical History   Past Surgical History:  Procedure Laterality Date  . CARDIAC CATHETERIZATION  07/09/2011   Normal coronaries  . CYSTOSCOPY WITH URETHRAL DILATATION  08/19/2011   Procedure: CYSTOSCOPY WITH URETHRAL DILATATION;  Surgeon: Dutch Gray, MD;  Location: [facility] ORS;  Service: Urology;  Laterality: N/A;  Balloon Dilation of Urethral Stricture, retro-urethrogram, Suprapubic change   . LEFT HEART CATHETERIZATION WITH CORONARY ANGIOGRAM N/A 07/09/2011   Procedure: LEFT HEART CATHETERIZATION WITH CORONARY ANGIOGRAM;  Surgeon: Jettie Booze, MD;  Location: [facility] CATH LAB;  Service: Cardiovascular;  Laterality: N/A;  . PACEMAKER INSERTION  07/15/11   Medtronic Revo  . PERMANENT  PACEMAKER INSERTION Left 07/15/2011   Procedure: PERMANENT PACEMAKER INSERTION;  Surgeon: Sanda Klein, MD;  Location: [facility] CATH LAB;  Service: Cardiovascular;  Laterality: Left;  . TEMPORARY PACEMAKER INSERTION Right 07/09/2011   Procedure: TEMPORARY PACEMAKER INSERTION;  Surgeon: Jettie Booze, MD;  Location: [facility] CATH LAB;  Service: Cardiovascular;  Laterality: Right;    Social History   Social History   Tobacco Use  . Smoking status: Former Smoker    Types: Cigarettes    Quit date: 06/20/1989    Years since quitting: 30.2  . Smokeless tobacco: Never Used  Substance Use Topics  . Alcohol use: No  . Drug use: No    Medications   Prior to Admission medications   Medication Sig Start Date End Date Taking? Authorizing Provider  acetaminophen (TYLENOL) 500 MG tablet Take 1,000 mg by mouth every 6 (six) hours as needed. Pain    Yes [provider]  Cholecalciferol (HM VITAMIN D3) 4000 units CAPS Take 1 capsule by mouth daily.   Yes [provider]  cyanocobalamin 2000 MCG tablet Take 2,000 mcg by mouth daily.   Yes [provider]  Multiple Vitamin (MULITIVITAMIN WITH MINERALS) TABS Take 1 tablet by mouth daily.    Yes [provider]    Allergies  No Known Allergies  Review of Systems  ROS  Neurologic Exam  Awake, alert, oriented Memory and concentration grossly intact Speech fluent, appropriate PERRL.  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 No serviceable hearing on right Uvula elevation symmetric. Tongue protrusion midline but some fasiculations seen.  Dysdiadochokinesia R>L R pronator drift Hyperreflexic  Imaging  CT head shows hypodense CPA lesion with mass effect on pons, right cerebellum.  No obstructive hydrocephalus.  Impression  - 64 y.o. male with large lesion of CP angle.  As he had a normal MRI in 2013, unclear what this lesion represents.  Slightly hyperdense to CSF but epidermoid  would be very unusual at his age and with normal previous MRI.  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   Plan  -- will need MRI for further characterization.  MRI is critically important for this type of lesion as otherwise it is very unclear how best to treat this. - can start steroids (dex 4 q 8) to help with his symptoms in the meantime - patient verbalized understanding and agreement and confirmed his pacemaker is MRI compatible.

## 2019-09-28 NOTE — Progress Notes (Addendum)
Neurosurgeon returned page and notified nurse that MRI is not emergent to go forward today, but MRI should be done no later than Monday.

## 2019-09-28 NOTE — Progress Notes (Signed)
PROGRESS NOTE   Casey Richard  [id number]    DOB: 11/23/1955    DOA: 09/27/2019  PCP: Benito Mccreedy, MD   I have briefly reviewed patients previous medical records in [facility].  Chief Complaint:   Chief Complaint  Patient presents with  . Dizziness  . Fall    Brief Narrative:  64 year old widowed male, PMH of complete heart block s/p PPM (had bradycardia, V. fib cardiac arrest with complete heart block in 2013) presented to [facility] ED on 4/9 due to progressive dizziness, memory issues, gait instability and falls.  Last fall was night prior to admission when he hit his head.  No LOC.  Has been ambulating with the help of a cane and has had significant loss of appetite/40 pound weight loss over the last year.  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  Neurosurgery was consulted, transferred to [facility], awaiting MRI brain which will likely not be done until 4/12 the earliest.  IV Decadron started.   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  This most likely represents an arachnoid cyst or epidermoid and could be symptomatic based on its mass-effect.  Cystic neoplasm felt less likely.  MRI recommended.  No acute C-spine findings.  Neurosurgery consultation appreciated.  Needs MRI for further characterization.  Unable to get MRI over the weekend due to need for coordination with [employer] etc. and as per my discussion with radiology team, unless urgent this will not be done until Monday.  I discussed with Dr. Marcello Moores and okay to do this on  Monday.  IV Decadron 4 mg every 6 hourly.  Pacemaker appears to be MRI compatible.  Dehydration: Continue gentle IV fluids.  Reports lack of taste and poor oral intake.  History of bradycardia, V. fib cardiac arrest 2013: S/PE PPM, follows with Dr. Sallyanne Kuster, last seen on 09/16/2019.  As per Dr. Tana Coast, patient has MRI conditional pacing system, can have MRI of the brain with right to reprogram and she contacted [employer].  Pacemaker was interrogated, normal device function however generator is approaching RRT.  History of urinary problems including urinary retention: He had significant ureteral injury during the syncopal event/cardiac arrest, has had issues with urinary retention, follows with Dr. Alinda Money with urology.  Urine microscopy did not show any bacteria but showed mild pyuria.  Urine culture shows >100 K of Staphylococcus epidermidis but patient lacks UTI symptoms.?  Asymptomatic bacteriuria versus contamination.   DVT prophylaxis: Subcutaneous heparin Code Status: Full Family Communication: None at bedside Disposition:  . Patient came from: Home           . Anticipated d/c place: To be determined pending further evaluation and therapies evaluation. . Barriers to d/c: Large right CP angle mass that requires further evaluation   Consultants:   Neurosurgery  Procedures:   None  Antimicrobials:   None   Subjective:  Patient reports reduced taste and poor oral intake.  Indicates that he has lack of sensation on the right side of his face but no pain.  Indicates unsteady on his feet and has been advised not  to get out of bed without assistance.  He verbalized understanding.  Patient seen in room with his RN.  Objective:   Vitals:   09/28/19 1212 09/28/19 1500 09/28/19 1616 09/28/19 1617  BP: (!) 132/91  120/77   Pulse:    73  Resp: 14 15 13    Temp: 97.8 F (36.6 C)   (!) 97.5 F (36.4 C)  TempSrc: Oral   Oral  SpO2:        General exam: Pleasant middle-age male, moderately  built and thinly nourished lying comfortably propped up in bed without distress. Respiratory system: Clear to auscultation. Respiratory effort normal. Cardiovascular system: S1 & S2 heard, RRR. No JVD, murmurs, rubs, gallops or clicks. No pedal edema.  Telemetry personally reviewed: AV paced rhythm. Gastrointestinal system: Abdomen is nondistended, soft and nontender. No organomegaly or masses felt. Normal bowel sounds heard. Central nervous system: Alert and oriented.?  Right 6th nerve palsy.  Right face in the area of V1-3 distribution with numbness but no tenderness. Extremities: Symmetric 5 x 5 power.  Ataxia.  Dysdiadochokinesia. Skin: No rashes, lesions or ulcers Psychiatry: Judgement and insight appear normal. Mood & affect appropriate.     Data Reviewed:   I have personally reviewed following labs and imaging studies   CBC: Recent Labs  Lab 09/27/19 1337 09/27/19 1943 09/28/19 0449  WBC 7.0 7.9 7.9  NEUTROABS 4.9  --   --   HGB 12.8* 13.5 13.4  HCT 38.6* 40.9 41.0  MCV 101.6* 98.1 98.6  PLT 198 224 [id number]    Basic Metabolic Panel: Recent Labs  Lab 09/27/19 1337 09/27/19 1943 09/28/19 0449  NA 140  --  139  K 4.3  --  4.0  CL 101  --  100  CO2 30  --  29  GLUCOSE 91  --  134*  BUN 14  --  11  CREATININE 0.62 0.69 0.71  CALCIUM 8.5*  --  9.1    Liver Function Tests: Recent Labs  Lab 09/27/19 1337  AST 18  ALT 25  ALKPHOS 52  BILITOT 0.9  PROT 6.6  ALBUMIN 3.7    CBG: No results for input(s): GLUCAP in the last 168 hours.  Microbiology Studies:   Recent Results (from the past 240 hour(s))  Urine culture     Status: Abnormal (Preliminary result)   Collection Time: 09/27/19  1:37 PM   Specimen: Urine, Random  Result Value Ref Range Status   Specimen Description   Final    URINE, RANDOM Performed at [facility] [address], [place], [place] [address]    Special Requests   Final    NONE Performed at [facility], [place] [address], [place], [place] [address]    Culture (A)  Final    >=100,000 COLONIES/mL STAPHYLOCOCCUS EPIDERMIDIS SUSCEPTIBILITIES TO FOLLOW Performed at [facility], [place] [address], [place], [place] [address]    Report Status PENDING  Incomplete  SARS CORONAVIRUS 2 (TAT 6-24 HRS) Nasopharyngeal Nasopharyngeal Swab     Status: None   Collection Time: 09/27/19  5:13 PM   Specimen: Nasopharyngeal Swab  Result Value Ref Range Status   SARS Coronavirus 2 NEGATIVE NEGATIVE Final    Comment: (NOTE) SARS-CoV-2 target nucleic acids are NOT DETECTED. The SARS-CoV-2 RNA is generally detectable in upper and lower respiratory specimens during the acute phase of infection. Negative results do not preclude SARS-CoV-2 infection, do not rule out co-infections with other pathogens, and should not  be used as the sole basis for treatment or other patient management decisions. Negative results must be combined with clinical observations, patient history, and epidemiological information. The expected result is Negative. Fact Sheet for Patients: SugarRoll.be Fact Sheet for Healthcare Providers: https://www.woods-mathews.com/ This test is not yet approved or cleared by the [place] FDA and  has been authorized for detection and/or diagnosis of SARS-CoV-2 by FDA under an Emergency Use Authorization (EUA). This EUA will remain  in effect (meaning this test can be used) for the duration of the COVID-19 declaration under Section 56 4(b)(1) of the Act, 21 U.S.C. section 360bbb-3(b)(1), unless the authorization is terminated or revoked sooner. Performed at [facility], [place] [address] [place], [place] [address]      Radiology Studies:  No results found.   Scheduled Meds:   . cholecalciferol  4,000 Units Oral Daily  . dexamethasone (DECADRON) injection  4 mg Intravenous Q6H  . heparin  5,000 Units Subcutaneous Q8H  .  multivitamin with minerals  1 tablet Oral Daily  . cyanocobalamin  2,000 mcg Oral Daily    Continuous Infusions:   . sodium chloride 75 mL/hr at 09/27/19 1840     LOS: 1 day     Vernell Leep, MD, [place], [facility]. [facility]    To contact the attending provider between 7A-7P or the covering provider during after hours 7P-7A, please log into the web site [email or web address] and access using universal [place] password for that web site. If you do not have the password, please call the hospital operator.  09/28/2019, 5:33 PM

## 2019-09-28 NOTE — Progress Notes (Signed)
Pt on the phone with brother in [facility] and gives permission for brother to be added to emergency contact and says brother can be updated about pt condition.

## 2019-09-28 NOTE — Progress Notes (Signed)
Spoke to Dr. Cathie Olden about cardiology order and was directed to reach out to Dr. Joylene Grapes. Dr. Joylene Grapes paged at [phone]) 11:05am, awaiting response.

## 2019-09-28 NOTE — Progress Notes (Signed)
Spoke to Dr. Rayann Heman in regards to EP cardiology order. Dr. Rayann Heman waiting on call from neurosurgery or attending physician to further discuss MRI scheduling priority.

## 2019-09-28 NOTE — Progress Notes (Signed)
Spoke with MRI tech in radiology. She states that Cardiologist needs to speak with Attending MD or Neurosurgery to determine the significance of having the MRI done today. Attending made aware. Paged neurosurgeon to reach out to cardiologist and or attending MD, so pt can be scheduled for MRI.

## 2019-09-29 DIAGNOSIS — G9389 Other specified disorders of brain: Secondary | ICD-10-CM

## 2019-09-29 LAB — URINE CULTURE: Culture: >=100000 — AB

## 2019-09-29 NOTE — Plan of Care (Signed)
  Problem: Education: Goal: Knowledge of General Education information will improve Description: Including pain rating scale, medication(s)/side effects and non-pharmacologic comfort measures Outcome: Progressing   Problem: Activity: Goal: Risk for activity intolerance will decrease Outcome: Progressing   Problem: Nutrition: Goal: Adequate nutrition will be maintained Outcome: Progressing   

## 2019-09-29 NOTE — Progress Notes (Signed)
Subjective: Patient reports no new symptoms  Objective: Vital signs in last 24 hours: Temp:  [97.5 F (36.4 C)-98.1 F (36.7 C)] 97.8 F (36.6 C) (04/11 0838) Pulse Rate:  [69-73] 70 (04/11 0838) Resp:  [13-19] 19 (04/11 0837) BP: (120-133)/(77-91) 133/80 (04/11 0837) SpO2:  [97 %-99 %] 99 % (04/11 0336)  Intake/Output from previous day: 04/10 0701 - 04/11 0700 In: 1487.2 [P.O.:480; I.V.:1007.2] Out: 1775 [Urine:1775] Intake/Output this shift: Total I/O In: -  Out: 800 [Urine:800]  Awake, alert Stable cranial nerve deficits  Lab Results: Recent Labs    09/27/19 1943 09/28/19 0449  WBC 7.9 7.9  HGB 13.5 13.4  HCT 40.9 41.0  PLT 224 252   BMET Recent Labs    09/27/19 1337 09/27/19 1337 09/27/19 1943 09/28/19 0449  NA 140  --   --  139  K 4.3  --   --  4.0  CL 101  --   --  100  CO2 30  --   --  29  GLUCOSE 91  --   --  134*  BUN 14  --   --  11  CREATININE 0.62   < > 0.69 0.71  CALCIUM 8.5*  --   --  9.1   < > = values in this interval not displayed.    Studies/Results: DG Ribs Unilateral W/Chest Right  Result Date: 09/27/2019 CLINICAL DATA:  Right-sided rib pain following fall, initial encounter EXAM: RIGHT RIBS AND CHEST - 3+ VIEW COMPARISON:  07/16/2011 FINDINGS: Cardiac shadow is within normal limits. Pacing device is again seen and stable. The lungs are clear. No pneumothorax is noted. No acute rib fracture is seen. No other focal abnormality is noted. IMPRESSION: No rib fractures identified. Electronically Signed   By: Inez Catalina M.D.   On: 09/27/2019 13:54   CT Head Wo Contrast  Result Date: 09/27/2019 CLINICAL DATA:  Fall last night. Blurred vision with facial numbness and neck pain. History of stroke. EXAM: CT HEAD WITHOUT CONTRAST CT CERVICAL SPINE WITHOUT CONTRAST TECHNIQUE: Multidetector CT imaging of the head and cervical spine was performed following the standard protocol without intravenous contrast. Multiplanar CT image reconstructions of the  cervical spine were also generated. COMPARISON:  CT head and cervical spine 07/09/2011. MRI head 02/15/2012. FINDINGS: CT HEAD FINDINGS Brain: There is a new large low-density mass at the right cerebellar pontine angle which measures up to 4.6 x 4.0 cm on image 8/3. This is well-circumscribed and likely cystic. There is mass effect on the pons and cerebellum with displacement of the 4th ventricle to the left. However, no resulting hydrocephalus. There is no evidence of acute intracranial hemorrhage, brain edema or other extra-axial fluid collection. No evidence of acute stroke. Vascular:  No hyperdense vessel identified. Skull: Negative for fracture or focal lesion. Sinuses/Orbits: Mild ethmoid sinus mucosal thickening. The additional visualized paranasal sinuses, mastoid air cells and middle ears are clear. No significant orbital findings. Other: None. CT CERVICAL SPINE FINDINGS Alignment: Mild reversal of the usual cervical lordosis without focal angulation or listhesis. Skull base and vertebrae: No evidence of acute cervical spine fracture or traumatic subluxation. Soft tissues and spinal canal: No prevertebral fluid or swelling. No visible canal hematoma. Disc levels: Mildly progressive spondylosis with uncinate spurring and endplate osteophytes at C5-6 and C6-7. No high-grade spinal stenosis. Upper chest: Mild paraseptal emphysematous changes and scarring at both lung apices. Right subclavian pacemaker noted. Other: Ossification of the ligamentum nuchae. IMPRESSION: 1. No acute intracranial or calvarial findings.  2. 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 Cystic neoplasm less likely. Recommend further evaluation with MRI of the brain without and with contrast (if possible; the patient has a pacemaker) and  non emergent neuro surgical consultation. 3. No evidence of acute cervical spine fracture, traumatic subluxation or static signs of instability. 4. Mildly progressive cervical spondylosis. Electronically Signed   By: Richardean Sale M.D.   On: 09/27/2019 14:15   CT Cervical Spine Wo Contrast  Result Date: 09/27/2019 CLINICAL DATA:  Fall last night. Blurred vision with facial numbness and neck pain. History of stroke. EXAM: CT HEAD WITHOUT CONTRAST CT CERVICAL SPINE WITHOUT CONTRAST TECHNIQUE: Multidetector CT imaging of the head and cervical spine was performed following the standard protocol without intravenous contrast. Multiplanar CT image reconstructions of the cervical spine were also generated. COMPARISON:  CT head and cervical spine 07/09/2011. MRI head 02/15/2012. FINDINGS: CT HEAD FINDINGS Brain: There is a new large low-density mass at the right cerebellar pontine angle which measures up to 4.6 x 4.0 cm on image 8/3. This is well-circumscribed and likely cystic. There is mass effect on the pons and cerebellum with displacement of the 4th ventricle to the left. However, no resulting hydrocephalus. There is no evidence of acute intracranial hemorrhage, brain edema or other extra-axial fluid collection. No evidence of acute stroke. Vascular:  No hyperdense vessel identified. Skull: Negative for fracture or focal lesion. Sinuses/Orbits: Mild ethmoid sinus mucosal thickening. The additional visualized paranasal sinuses, mastoid air cells and middle ears are clear. No significant orbital findings. Other: None. CT CERVICAL SPINE FINDINGS Alignment: Mild reversal of the usual cervical lordosis without focal angulation or listhesis. Skull base and vertebrae: No evidence of acute cervical spine fracture or traumatic subluxation. Soft tissues and spinal canal: No prevertebral fluid or swelling. No visible canal hematoma. Disc levels: Mildly progressive spondylosis with uncinate spurring and endplate osteophytes at  C5-6 and C6-7. No high-grade spinal stenosis. Upper chest: Mild paraseptal emphysematous changes and scarring at both lung apices. Right subclavian pacemaker noted. Other: Ossification of the ligamentum nuchae. IMPRESSION: 1. No acute intracranial or calvarial findings. 2. 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 Cystic neoplasm less likely. Recommend further evaluation with MRI of the brain without and with contrast (if possible; the patient has a pacemaker) and non emergent neuro surgical consultation. 3. No evidence of acute cervical spine fracture, traumatic subluxation or static signs of instability. 4. Mildly progressive cervical spondylosis. Electronically Signed   By: Richardean Sale M.D.   On: 09/27/2019 14:15    Assessment/Plan: Posterior fossa cystic lesion - awaiting MRI tomorrow with brain lab to further characterize   Casey Richard 09/29/2019, 11:22 AM

## 2019-09-29 NOTE — Progress Notes (Signed)
PROGRESS NOTE   Casey Richard  [id number]    DOB: 1955-10-12    DOA: 09/27/2019  PCP: Benito Mccreedy, MD   I have briefly reviewed patients previous medical records in [facility].  Chief Complaint:   Chief Complaint  Patient presents with  . Dizziness  . Fall    Brief Narrative:  64 year old widowed male, PMH of complete heart block s/p PPM (had bradycardia, V. fib cardiac arrest with complete heart block in 2013) presented to [facility] ED on 4/9 due to progressive dizziness, memory issues, gait instability and falls.  Last fall was night prior to admission when he hit his head.  No LOC.  Has been ambulating with the help of a cane and has had significant loss of appetite/40 pound weight loss over the last year.  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  Neurosurgery was consulted, transferred to [facility], awaiting MRI brain which will likely not be done until 4/12 the earliest.  IV Decadron started.   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  This most likely represents an arachnoid cyst or epidermoid and could be symptomatic based on its mass-effect.  Cystic neoplasm felt less likely.  MRI recommended.  No acute C-spine findings.  Neurosurgery consultation appreciated.  Needs MRI for further characterization.  Unable to get MRI over the weekend due to need for coordination with [employer] etc. and as per my discussion with radiology team 4/10, unless urgent this will not be done until Monday.  I discussed with Dr. Marcello Moores and okay to do  this on Monday.  IV Decadron 4 mg every 6 hourly.  Pacemaker appears to be MRI compatible.  Reports that his vision is better.  Dehydration: Briefly hydrated with IV fluids, DC.  History of bradycardia, V. fib cardiac arrest 2013: S/PE PPM, follows with Dr. Sallyanne Kuster, last seen on 09/16/2019.  As per Dr. Tana Coast, patient has MRI conditional pacing system, can have MRI of the brain with right to reprogram and she contacted [employer].  Pacemaker was interrogated, normal device function however generator is approaching RRT.  Stable on telemetry.  History of urinary problems including urinary retention: He had significant ureteral injury during the syncopal event/cardiac arrest, has had issues with urinary retention, follows with Dr. Alinda Money with urology.  Urine microscopy did not show any bacteria but showed mild pyuria.  Urine culture shows >100 K of Staphylococcus epidermidis but patient lacks UTI symptoms.?  Asymptomatic bacteriuria versus contamination.  Severe Weight loss: unclear etiology, ? D/t the brain mass. May need further evaluation.   DVT prophylaxis: Subcutaneous heparin Code Status: Full Family Communication: None at bedside.  I called and discussed in detail with patient's brother who is a EMT, updated care and answered questions. Disposition:  . Patient came from: Home           . Anticipated d/c place: To be determined pending further evaluation and therapies evaluation. . Barriers to d/c: Large right CP angle mass that requires further evaluation   Consultants:   Neurosurgery  Procedures:   None  Antimicrobials:   None  Subjective:  Patient interviewed and examined along with RN in room.  Reports feeling better.  Vision bilaterally has improved.  No other complaints reported.  Wanted me to call his brother and update care.  Reports that he lives with his 3 adult sons.  Objective:   Vitals:   09/29/19 0336 09/29/19 0837 09/29/19 0838 09/29/19 1141  BP: 127/82 133/80   125/73  Pulse: 69  70 70  Resp: 16 19    Temp: 97.6 F (36.4 C)  97.8 F (36.6 C) 97.8 F (36.6 C)  TempSrc: Oral  Oral Oral  SpO2: 99%   98%    General exam: Pleasant middle-age male, moderately built and thinly nourished lying comfortably propped up in bed without distress. Respiratory system: Clear to auscultation.  No increased work of breathing. Cardiovascular system: S1 and S2 heard, RRR.  No JVD, murmurs or pedal edema.  Telemetry personally reviewed: AV paced rhythm. Gastrointestinal system: Abdomen is nondistended, soft and nontender. No organomegaly or masses felt. Normal bowel sounds heard. Central nervous system: Alert and oriented.?  Right 6th nerve palsy.  Right face in the area of V1-3 distribution with numbness but no tenderness. Extremities: Symmetric 5 x 5 power.  Ataxia.  Dysdiadochokinesia. Skin: No rashes, lesions or ulcers Psychiatry: Judgement and insight appear normal. Mood & affect appropriate.     Data Reviewed:   I have personally reviewed following labs and imaging studies   CBC: Recent Labs  Lab 09/27/19 1337 09/27/19 1943 09/28/19 0449  WBC 7.0 7.9 7.9  NEUTROABS 4.9  --   --   HGB 12.8* 13.5 13.4  HCT 38.6* 40.9 41.0  MCV 101.6* 98.1 98.6  PLT 198 224 [id number]    Basic Metabolic Panel: Recent Labs  Lab 09/27/19 1337 09/27/19 1943 09/28/19 0449  NA 140  --  139  K 4.3  --  4.0  CL 101  --  100  CO2 30  --  29  GLUCOSE 91  --  134*  BUN 14  --  11  CREATININE 0.62 0.69 0.71  CALCIUM 8.5*  --  9.1    Liver Function Tests: Recent Labs  Lab 09/27/19 1337  AST 18  ALT 25  ALKPHOS 52  BILITOT 0.9  PROT 6.6  ALBUMIN 3.7    CBG: No results for input(s): GLUCAP in the last 168 hours.  Microbiology Studies:   Recent Results (from the past 240 hour(s))  Urine culture     Status: Abnormal   Collection Time: 09/27/19  1:37 PM   Specimen: Urine, Random  Result Value Ref Range Status   Specimen Description   Final    URINE,  RANDOM Performed at [place] [address], [place], [place] [address]    Special Requests   Final    NONE Performed at [facility], [place] [address], [place], [place] [address]    Culture >=100,000 COLONIES/mL STAPHYLOCOCCUS EPIDERMIDIS (A)  Final   Report Status 09/29/2019 FINAL  Final   Organism ID, Bacteria STAPHYLOCOCCUS EPIDERMIDIS (A)  Final      Susceptibility   Staphylococcus epidermidis - MIC*    CIPROFLOXACIN <=0.5 SENSITIVE Sensitive     GENTAMICIN <=0.5 SENSITIVE Sensitive     NITROFURANTOIN <=16 SENSITIVE Sensitive     OXACILLIN >=4 RESISTANT Resistant     TETRACYCLINE >=16 RESISTANT Resistant     VANCOMYCIN 2 SENSITIVE Sensitive     TRIMETH/SULFA <=10 SENSITIVE Sensitive     CLINDAMYCIN RESISTANT Resistant  RIFAMPIN <=0.5 SENSITIVE Sensitive     Inducible Clindamycin POSITIVE Resistant     * >=100,000 COLONIES/mL STAPHYLOCOCCUS EPIDERMIDIS  SARS CORONAVIRUS 2 (TAT 6-24 HRS) Nasopharyngeal Nasopharyngeal Swab     Status: None   Collection Time: 09/27/19  5:13 PM   Specimen: Nasopharyngeal Swab  Result Value Ref Range Status   SARS Coronavirus 2 NEGATIVE NEGATIVE Final    Comment: (NOTE) SARS-CoV-2 target nucleic acids are NOT DETECTED. The SARS-CoV-2 RNA is generally detectable in upper and lower respiratory specimens during the acute phase of infection. Negative results do not preclude SARS-CoV-2 infection, do not rule out co-infections with other pathogens, and should not be used as the sole basis for treatment or other patient management decisions. Negative results must be combined with clinical observations, patient history, and epidemiological information. The expected result is Negative. Fact Sheet for Patients: SugarRoll.be Fact Sheet for Healthcare Providers: https://www.woods-mathews.com/ This test is not yet approved or cleared by the [place] FDA and  has been  authorized for detection and/or diagnosis of SARS-CoV-2 by FDA under an Emergency Use Authorization (EUA). This EUA will remain  in effect (meaning this test can be used) for the duration of the COVID-19 declaration under Section 56 4(b)(1) of the Act, 21 U.S.C. section 360bbb-3(b)(1), unless the authorization is terminated or revoked sooner. Performed at [facility], [place] [address], [place], [place] [address]      Radiology Studies:  No results found.   Scheduled Meds:   . cholecalciferol  4,000 Units Oral Daily  . dexamethasone (DECADRON) injection  4 mg Intravenous Q6H  . heparin  5,000 Units Subcutaneous Q8H  . multivitamin with minerals  1 tablet Oral Daily  . cyanocobalamin  2,000 mcg Oral Daily    Continuous Infusions:      LOS: 2 days     Vernell Leep, MD, [place], [facility]. [facility]    To contact the attending provider between 7A-7P or the covering provider during after hours 7P-7A, please log into the web site [email or web address] and access using universal [place] password for that web site. If you do not have the password, please call the hospital operator.  09/29/2019, 3:00 PM

## 2019-09-30 ENCOUNTER — Inpatient Hospital Stay (HOSPITAL_COMMUNITY): Payer: Medicare Other

## 2019-09-30 DIAGNOSIS — R2681 Unsteadiness on feet: Secondary | ICD-10-CM | POA: Diagnosis not present

## 2019-09-30 DIAGNOSIS — G9389 Other specified disorders of brain: Secondary | ICD-10-CM | POA: Diagnosis not present

## 2019-09-30 MED ORDER — GADOBUTROL 1 MMOL/ML IV SOLN
6.0000 mL | Freq: Once | INTRAVENOUS | Status: AC | PRN
  Administered 2019-09-30: 7.5 mL via INTRAVENOUS

## 2019-09-30 MED ORDER — MELATONIN 3 MG PO TABS
3.0000 mg | ORAL_TABLET | Freq: Every day | ORAL | Status: DC
  Administered 2019-09-30 – 2019-10-09 (×11): 3 mg via ORAL
  Filled 2019-09-30 (×14): qty 1

## 2019-09-30 MED ORDER — SENNA 8.6 MG PO TABS
2.0000 | ORAL_TABLET | Freq: Every day | ORAL | Status: DC
  Administered 2019-09-30 – 2019-10-09 (×10): 17.2 mg via ORAL
  Filled 2019-09-30 (×10): qty 2

## 2019-09-30 NOTE — Progress Notes (Addendum)
PROGRESS NOTE   Casey Richard  [id number]    DOB: 1956-02-11    DOA: 09/27/2019  PCP: Benito Mccreedy, MD   I have briefly reviewed patients previous medical records in [facility].  Chief Complaint:   Chief Complaint  Patient presents with  . Dizziness  . Fall    Brief Narrative:  64 year old widowed male, PMH of complete heart block s/p PPM (had bradycardia, V. fib cardiac arrest with complete heart block in 2013) presented to [facility] ED on 4/9 due to progressive dizziness, memory issues, gait instability and falls.  Last fall was night prior to admission when he hit his head.  No LOC.  Has been ambulating with the help of a cane and has had significant loss of appetite/40 pound weight loss over the last year.  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  Neurosurgery was consulted, transferred to [facility].  IV Decadron started.  MRI brain results as below, Neurosurgery follow-up pending.   Assessment & Plan:  Principal Problem:   Brain mass Active Problems:   Ventricular fibrillation (HCC)   CHB (complete heart block) (HCC)   Gait instability   Large right cerebellopontine angle mass with mass-effect on brain, dizziness, memory impairment, ataxia and falls: CT head: New large cystic mass at the right cerebellopontine angle with mass-effect on the pons and cerebellum, but no resulting hydrocephalus.  This most likely represents an arachnoid cyst or epidermoid and could be symptomatic based on its mass-effect.  Cystic neoplasm felt less likely.  MRI recommended.  No acute C-spine findings.  Neurosurgery consultation appreciated and awaiting MRI brain which could not be done on the weekend.  IV Decadron 4 mg every 6 hourly. Reports that his vision is better.  MRI brain finally obtained on 4/12: - 4.7 x 3.4 x 4.1 cm multicystic right CP angle mass with associated abnormal enhancement extending into  the right internal auditory canal.  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  Significant associated mass-effect upon the right CP angle and cerebellar medullary angle with partial effacement of the fourth ventricle and mild adjacent parenchymal edema.  No hydrocephalus. -A 17 x 14 mm hypoenhancing mass within the left aspect of the sella has increased in size compared to MRI from 02/15/2012.  Findings suspicious for pituitary adenoma, although a developmental cystic lesion is also consideration.  Await Neurosurgery follow-up and recommendations.  If surgery is planned, will need Cardiology consultation for preop clearance and perioperative management.   Dehydration: Briefly hydrated with IV fluids, DC.  Tolerating diet well.  History of bradycardia, V. fib cardiac arrest 2013: S/PE PPM, follows with Dr. Sallyanne Kuster, last seen on 09/16/2019.  As per Dr. Tana Coast, patient has MRI conditional pacing system, can have MRI of the brain with right to reprogram and she contacted [employer].  Pacemaker was interrogated, normal device function however generator is approaching RRT.  Stable on telemetry.  History of urinary problems including urinary retention: He had significant ureteral injury during the syncopal event/cardiac arrest, has had issues with urinary retention, follows with Dr. Alinda Money with urology.  Urine microscopy did not show any bacteria but showed mild pyuria.  Urine culture shows >100 K of Staphylococcus epidermidis but patient lacks UTI symptoms.?  Asymptomatic bacteriuria versus contamination.  Severe Weight loss: unclear etiology, ? D/t the brain mass. May need further evaluation.   DVT prophylaxis: Subcutaneous heparin Code Status: Full Family Communication: None at bedside.  I called and discussed in detail with patient's brother who is a EMT on 4/11, updated care and answered questions. Disposition:  . Patient came from: Home           . Anticipated d/c place: To be  determined pending further evaluation and therapies evaluation. . Barriers to d/c: Significant brain masses as noted above, awaiting further evaluation and management.   Consultants:   Neurosurgery  Procedures:   None  Antimicrobials:   None   Subjective:  Seen this morning prior to MRI.  No new complaints reported.  No headache, nausea or vomiting.  No new visual symptoms.  Was anxious to know when the MRI was going to be done  Objective:   Vitals:   09/29/19 1956 09/30/19 0025 09/30/19 0348 09/30/19 0711  BP: 139/87 123/82 129/79 (!) 148/82  Pulse: 70 70 70 72  Resp: 20 14 14 14   Temp:  (!) 97.5 F (36.4 C) 97.6 F (36.4 C) 97.6 F (36.4 C)  TempSrc: Oral Oral Oral Oral  SpO2: 97% 98% 98% 97%    General exam: Pleasant middle-age male, moderately built and thinly nourished sitting up comfortably in bed eating breakfast. Respiratory system: Clear to auscultation.  No increased work of breathing. Cardiovascular system: S1 and S2 heard, RRR.  No JVD, murmurs or pedal edema.  Telemetry shows AV paced rhythm. Gastrointestinal system: Abdomen is nondistended, soft and nontender. No organomegaly or masses felt. Normal bowel sounds heard. Central nervous system: Alert and oriented.?  Right 6th nerve palsy.  Right face in the area of V1-3 distribution with numbness but no tenderness.  No new findings. Extremities: Symmetric 5 x 5 power.  Ataxia.  Dysdiadochokinesia. Skin: No rashes, lesions or ulcers Psychiatry: Judgement and insight appear normal. Mood & affect appropriate.     Data Reviewed:   I have personally reviewed following labs and imaging studies   CBC: Recent Labs  Lab 09/27/19 1337 09/27/19 1943 09/28/19 0449  WBC 7.0 7.9 7.9  NEUTROABS 4.9  --   --   HGB 12.8* 13.5 13.4  HCT 38.6* 40.9 41.0  MCV 101.6* 98.1 98.6  PLT 198 224 [id number]    Basic Metabolic Panel: Recent Labs  Lab 09/27/19 1337 09/27/19 1943 09/28/19 0449  NA 140  --  139  K 4.3  --  4.0   CL 101  --  100  CO2 30  --  29  GLUCOSE 91  --  134*  BUN 14  --  11  CREATININE 0.62 0.69 0.71  CALCIUM 8.5*  --  9.1    Liver Function Tests: Recent Labs  Lab 09/27/19 1337  AST 18  ALT 25  ALKPHOS 52  BILITOT 0.9  PROT 6.6  ALBUMIN 3.7    CBG: No results for input(s): GLUCAP in the last 168 hours.  Microbiology Studies:   Recent Results (from the past 240 hour(s))  Urine culture     Status: Abnormal   Collection Time: 09/27/19  1:37 PM   Specimen: Urine, Random  Result Value Ref Range Status   Specimen Description   Final    URINE, RANDOM Performed at [facility] [address], [place], [place] [address]    Special Requests   Final    NONE Performed at [facility], [place] [address], [place], [place] [address]    Culture >=100,000 COLONIES/mL STAPHYLOCOCCUS EPIDERMIDIS (A)  Final   Report Status 09/29/2019 FINAL  Final   Organism ID, Bacteria STAPHYLOCOCCUS  EPIDERMIDIS (A)  Final      Susceptibility   Staphylococcus epidermidis - MIC*    CIPROFLOXACIN <=0.5 SENSITIVE Sensitive     GENTAMICIN <=0.5 SENSITIVE Sensitive     NITROFURANTOIN <=16 SENSITIVE Sensitive     OXACILLIN >=4 RESISTANT Resistant     TETRACYCLINE >=16 RESISTANT Resistant     VANCOMYCIN 2 SENSITIVE Sensitive     TRIMETH/SULFA <=10 SENSITIVE Sensitive     CLINDAMYCIN RESISTANT Resistant     RIFAMPIN <=0.5 SENSITIVE Sensitive     Inducible Clindamycin POSITIVE Resistant     * >=100,000 COLONIES/mL STAPHYLOCOCCUS EPIDERMIDIS  SARS CORONAVIRUS 2 (TAT 6-24 HRS) Nasopharyngeal Nasopharyngeal Swab     Status: None   Collection Time: 09/27/19  5:13 PM   Specimen: Nasopharyngeal Swab  Result Value Ref Range Status   SARS Coronavirus 2 NEGATIVE NEGATIVE Final    Comment: (NOTE) SARS-CoV-2 target nucleic acids are NOT DETECTED. The SARS-CoV-2 RNA is generally detectable in upper and lower respiratory specimens during the acute phase of infection.  Negative results do not preclude SARS-CoV-2 infection, do not rule out co-infections with other pathogens, and should not be used as the sole basis for treatment or other patient management decisions. Negative results must be combined with clinical observations, patient history, and epidemiological information. The expected result is Negative. Fact Sheet for Patients: SugarRoll.be Fact Sheet for Healthcare Providers: https://www.woods-mathews.com/ This test is not yet approved or cleared by the [place] FDA and  has been authorized for detection and/or diagnosis of SARS-CoV-2 by FDA under an Emergency Use Authorization (EUA). This EUA will remain  in effect (meaning this test can be used) for the duration of the COVID-19 declaration under Section 56 4(b)(1) of the Act, 21 U.S.C. section 360bbb-3(b)(1), unless the authorization is terminated or revoked sooner. Performed at [facility], [place] [address] [place], [place] [address]      Radiology Studies:  MR Brain W and Wo Contrast  Result Date: 09/30/2019 CLINICAL DATA:  Provided history: Patient presents with dizziness, falls, memory issues in the last few months, progressively worsening. EXAM: MRI HEAD WITHOUT AND WITH CONTRAST TECHNIQUE: Multiplanar, multiecho pulse sequences of the brain and surrounding structures were obtained without and with intravenous contrast. CONTRAST:  7.47mL GADAVIST GADOBUTROL 1 MMOL/ML IV SOLN COMPARISON:  Head CT 09/27/2019, brain MRI 02/15/2012 FINDINGS: Brain: Within the right cerebellopontine angle, there is a multi-cystic mass with internal enhancing septations measuring 4.7 x 3.4 x 4.1 cm (AP x TV x CC). There is associated abnormal enhancement which extends into the right internal auditory canal to the level of the fundus. This mass was present on prior MRI 02/15/2012 and has increased in size since that time. There is significant associated mass effect  upon the right cerebellopontine and cerebellomedullary angle with mild adjacent parenchymal edema and partial effacement of the fourth ventricle. No hydrocephalus. The mass comes in close proximity to, but does not appear to encase, the V4 right vertebral artery and basilar artery. As compared to prior MRI 02/15/2012, there has been an interval increase in size of a hypoenhancing mass centered within the left aspect of the sella, now measuring 17 x 14 mm in transaxial dimensions (previously 8 x 10 mm). There is resultant rightward displacement of normal pituitary tissue and of the pituitary stalk. There is suprasellar extension without mass effect upon the optic chiasm. There is no evidence of acute infarct. No extra-axial fluid collection. No chronic intracranial blood products. Mild scattered T2/FLAIR hyperintensity within the cerebral  white matter has progressed from prior exam 02/15/2012 and is nonspecific, but consistent with chronic small vessel ischemic disease. Cerebral volume is normal for age. Vascular: Flow voids maintained within the proximal large arterial vessels. Skull and upper cervical spine: No focal marrow lesion. Sinuses/Orbits: Visualized orbits demonstrate no acute abnormality. Mild ethmoid sinus mucosal thickening. Small bilateral maxillary sinus mucous retention cysts. No significant mastoid effusion. IMPRESSION: 1. 4.7 x 3.4 x 4.1 cm multi-cystic right cerebellopontine angle mass with associated abnormal enhancement extending into the right internal auditory canal. 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 Significant associated mass effect upon the right cerebellopontine and cerebellomedullary angle with partial effacement of the fourth ventricle and mild adjacent parenchymal edema. No hydrocephalus. 2. A 17 x 14 mm hypoenhancing mass within the left aspect of the sella has increased in size as compared MRI 02/15/2012. Findings are suspicious for pituitary adenoma,  although a developmental cystic lesion is also a consideration. Correlate with relevant laboratory values. 3. Mild chronic small vessel ischemic, progressed as compared to 02/15/2012. 4. Mild ethmoid sinus mucosal thickening. Small bilateral maxillary sinus mucous retention cysts. Electronically Signed   By: Kellie Simmering DO   On: 09/30/2019 15:35     Scheduled Meds:   . cholecalciferol  4,000 Units Oral Daily  . dexamethasone (DECADRON) injection  4 mg Intravenous Q6H  . heparin  5,000 Units Subcutaneous Q8H  . melatonin  3 mg Oral QHS  . multivitamin with minerals  1 tablet Oral Daily  . cyanocobalamin  2,000 mcg Oral Daily    Continuous Infusions:      LOS: 3 days     Vernell Leep, MD, [place], [facility]. [facility]    To contact the attending provider between 7A-7P or the covering provider during after hours 7P-7A, please log into the web site [email or web address] and access using universal [place] password for that web site. If you do not have the password, please call the hospital operator.  09/30/2019, 4:21 PM

## 2019-10-01 DIAGNOSIS — G9389 Other specified disorders of brain: Secondary | ICD-10-CM | POA: Diagnosis not present

## 2019-10-01 LAB — GLUCOSE, CAPILLARY
Glucose-Capillary: 159 mg/dL — ABNORMAL HIGH (ref 70–99)
Glucose-Capillary: 118 mg/dL — ABNORMAL HIGH (ref 70–99)
Glucose-Capillary: 115 mg/dL — ABNORMAL HIGH (ref 70–99)
Glucose-Capillary: 119 mg/dL — ABNORMAL HIGH (ref 70–99)
Glucose-Capillary: 102 mg/dL — ABNORMAL HIGH (ref 70–99)
Glucose-Capillary: 138 mg/dL — ABNORMAL HIGH (ref 70–99)

## 2019-10-01 LAB — VITAMIN D 25 HYDROXY (VIT D DEFICIENCY, FRACTURES): Vit D, 25-Hydroxy: 37.27 ng/mL (ref 30–100)

## 2019-10-01 LAB — T4, FREE: Free T4: 0.67 ng/dL (ref 0.61–1.12)

## 2019-10-01 LAB — TSH: TSH: 0.661 u[IU]/mL (ref 0.350–4.500)

## 2019-10-01 MED ORDER — SENNOSIDES-DOCUSATE SODIUM 8.6-50 MG PO TABS: 2.0000 | ORAL_TABLET | Freq: Every evening | ORAL | Status: DC | PRN

## 2019-10-01 MED ORDER — INSULIN ASPART 100 UNIT/ML ~~LOC~~ SOLN
0.0000 [IU] | Freq: Three times a day (TID) | SUBCUTANEOUS | Status: DC
  Administered 2019-10-01 – 2019-10-08 (×2): 1 [IU] via SUBCUTANEOUS

## 2019-10-01 MED ORDER — POLYETHYLENE GLYCOL 3350 17 G PO PACK
17.0000 g | PACK | Freq: Every day | ORAL | Status: DC | PRN
  Administered 2019-10-02: 17 g via ORAL
  Filled 2019-10-01: qty 1

## 2019-10-01 NOTE — Progress Notes (Signed)
Discussed w/ pt re OR timing. Will have to reschedule the OR for 4/19, keep NPO p MN on Sunday night. Continue therapies, continue IV decadron to help optimize cranial nerve function prior to surgery. Will send off pituitary labs.

## 2019-10-01 NOTE — Progress Notes (Signed)
PROGRESS NOTE    LABON FONT  [id number] DOB: 03-12-1956 DOA: 09/27/2019 PCP: Benito Mccreedy, MD   Brief Narrative:  64 year old with history of complete heart block status post PPM admitted to the hospital for worsening dizziness, gait instability, weight loss.  CT showed new large cystic mass in the right cerebellopontine area with mass-effect on pons and cerebellum.  MRI confirmed 2 large brain masses.  Neurosurgery team consulted.  IV Decadron started.   Assessment & Plan:   Principal Problem:   Brain mass Active Problems:   Ventricular fibrillation (HCC)   CHB (complete heart block) (HCC)   Gait instability  Failure to thrive-adult, dizziness, gait instability Large right-sided cystic cerebellopontine brain mass Hypoenhancing mass in the left aspect of the sella with displacement of normal pituitary tissue -Neurosurgery team consulted-awaiting their input -IV Decadron 4 mg every 6 hours.  Accu-Chek sliding scale while on Decadron.  Defer the need for seizure precautions to neurosurgery -Supportive care -We will need PT/OT.  History of bradycardia/V. fib arrest in 2013 Status post pacemaker -[place] outpatient cardiology Dr Sallyanne Kuster.  History of urinary retention -Follows with Dr. Alinda Money from urology outpatient. -Urine cultures growing Staph epidermidis.  Currently he is asymptomatic  Severe weight loss -Secondary to underlying malignancy.  Encourage p.o. intake. -Consult dietitian   DVT prophylaxis: Subcutaneous heparin Code Status: Full code Family Communication:   Disposition Plan:   Patient From= home  Patient Anticipated D/C place= to be determined  Barriers= maintain hospital stay until input from neurosurgery regarding his brain mass.    Subjective: He and examined at bedside, no complaints overall feels okay.  Review of Systems Otherwise negative except as per HPI, including: General: Denies fever, chills, night sweats or unintended  weight loss. Resp: Denies cough, wheezing, shortness of breath. Cardiac: Denies chest pain, palpitations, orthopnea, paroxysmal nocturnal dyspnea. GI: Denies abdominal pain, nausea, vomiting, diarrhea or constipation GU: Denies dysuria, frequency, hesitancy or incontinence MS: Denies muscle aches, joint pain or swelling Neuro: Denies headache, neurologic deficits (focal weakness, numbness, tingling), abnormal gait Psych: Denies anxiety, depression, SI/HI/AVH Skin: Denies new rashes or lesions ID: Denies sick contacts, exotic exposures, travel  Examination:  General exam: Appears calm and comfortable, some bilateral temporal wasting Respiratory system: Clear to auscultation. Respiratory effort normal. Cardiovascular system: S1 & S2 heard, RRR. No JVD, murmurs, rubs, gallops or clicks. No pedal edema. Gastrointestinal system: Abdomen is nondistended, soft and nontender. No organomegaly or masses felt. Normal bowel sounds heard. Central nervous system: Alert and oriented.   Mild right-sided facial numbness Extremities: Symmetric 5 x 5 power. Skin: No rashes, lesions or ulcers Psychiatry: Judgement and insight appear normal. Mood & affect appropriate.     Objective: Vitals:   09/30/19 1730 09/30/19 1948 10/01/19 0001 10/01/19 0412  BP: (!) 141/78 (!) 146/83 135/86 (!) 150/93  Pulse: 76 70 70 69  Resp: 20 16 15 14   Temp: 98 F (36.7 C) 97.6 F (36.4 C) 97.8 F (36.6 C) (!) 97.5 F (36.4 C)  TempSrc: Oral Oral Oral Oral  SpO2: 99% 93% 95% 95%    Intake/Output Summary (Last 24 hours) at 10/01/2019 0804 Last data filed at 10/01/2019 0424 Gross per 24 hour  Intake --  Output 2400 ml  Net -2400 ml   There were no vitals filed for this visit.   Data Reviewed:   CBC: Recent Labs  Lab 09/27/19 1337 09/27/19 1943 09/28/19 0449  WBC 7.0 7.9 7.9  NEUTROABS 4.9  --   --  HGB 12.8* 13.5 13.4  HCT 38.6* 40.9 41.0  MCV 101.6* 98.1 98.6  PLT 198 224 [id number]   Basic Metabolic  Panel: Recent Labs  Lab 09/27/19 1337 09/27/19 1943 09/28/19 0449  NA 140  --  139  K 4.3  --  4.0  CL 101  --  100  CO2 30  --  29  GLUCOSE 91  --  134*  BUN 14  --  11  CREATININE 0.62 0.69 0.71  CALCIUM 8.5*  --  9.1   GFR: CrCl cannot be calculated (Unknown ideal weight.). Liver Function Tests: Recent Labs  Lab 09/27/19 1337  AST 18  ALT 25  ALKPHOS 52  BILITOT 0.9  PROT 6.6  ALBUMIN 3.7   No results for input(s): LIPASE, AMYLASE in the last 168 hours. No results for input(s): AMMONIA in the last 168 hours. Coagulation Profile: No results for input(s): INR, PROTIME in the last 168 hours. Cardiac Enzymes: No results for input(s): CKTOTAL, CKMB, CKMBINDEX, TROPONINI in the last 168 hours. BNP (last 3 results) No results for input(s): PROBNP in the last 8760 hours. HbA1C: No results for input(s): HGBA1C in the last 72 hours. CBG: No results for input(s): GLUCAP in the last 168 hours. Lipid Profile: No results for input(s): CHOL, HDL, LDLCALC, TRIG, CHOLHDL, LDLDIRECT in the last 72 hours. Thyroid Function Tests: No results for input(s): TSH, T4TOTAL, FREET4, T3FREE, THYROIDAB in the last 72 hours. Anemia Panel: No results for input(s): VITAMINB12, FOLATE, FERRITIN, TIBC, IRON, RETICCTPCT in the last 72 hours. Sepsis Labs: No results for input(s): PROCALCITON, LATICACIDVEN in the last 168 hours.  Recent Results (from the past 240 hour(s))  Urine culture     Status: Abnormal   Collection Time: 09/27/19  1:37 PM   Specimen: Urine, Random  Result Value Ref Range Status   Specimen Description   Final    URINE, RANDOM Performed at [facility] [address], [place], [place] [address]    Special Requests   Final    NONE Performed at [facility], [place] [address], [place], [place] [address]    Culture >=100,000 COLONIES/mL STAPHYLOCOCCUS EPIDERMIDIS (A)  Final   Report Status 09/29/2019 FINAL  Final   Organism ID,  Bacteria STAPHYLOCOCCUS EPIDERMIDIS (A)  Final      Susceptibility   Staphylococcus epidermidis - MIC*    CIPROFLOXACIN <=0.5 SENSITIVE Sensitive     GENTAMICIN <=0.5 SENSITIVE Sensitive     NITROFURANTOIN <=16 SENSITIVE Sensitive     OXACILLIN >=4 RESISTANT Resistant     TETRACYCLINE >=16 RESISTANT Resistant     VANCOMYCIN 2 SENSITIVE Sensitive     TRIMETH/SULFA <=10 SENSITIVE Sensitive     CLINDAMYCIN RESISTANT Resistant     RIFAMPIN <=0.5 SENSITIVE Sensitive     Inducible Clindamycin POSITIVE Resistant     * >=100,000 COLONIES/mL STAPHYLOCOCCUS EPIDERMIDIS  SARS CORONAVIRUS 2 (TAT 6-24 HRS) Nasopharyngeal Nasopharyngeal Swab     Status: None   Collection Time: 09/27/19  5:13 PM   Specimen: Nasopharyngeal Swab  Result Value Ref Range Status   SARS Coronavirus 2 NEGATIVE NEGATIVE Final    Comment: (NOTE) SARS-CoV-2 target nucleic acids are NOT DETECTED. The SARS-CoV-2 RNA is generally detectable in upper and lower respiratory specimens during the acute phase of infection. Negative results do not preclude SARS-CoV-2 infection, do not rule out co-infections with other pathogens, and should not be used as the sole basis for treatment or other patient management decisions. Negative results must be  combined with clinical observations, patient history, and epidemiological information. The expected result is Negative. Fact Sheet for Patients: SugarRoll.be Fact Sheet for Healthcare Providers: https://www.woods-mathews.com/ This test is not yet approved or cleared by the [place] FDA and  has been authorized for detection and/or diagnosis of SARS-CoV-2 by FDA under an Emergency Use Authorization (EUA). This EUA will remain  in effect (meaning this test can be used) for the duration of the COVID-19 declaration under Section 56 4(b)(1) of the Act, 21 U.S.C. section 360bbb-3(b)(1), unless the authorization is terminated or revoked  sooner. Performed at [facility], [place] [address] [place], [place] [address]          Radiology Studies: MR Brain W and Wo Contrast  Result Date: 09/30/2019 CLINICAL DATA:  Provided history: Patient presents with dizziness, falls, memory issues in the last few months, progressively worsening. EXAM: MRI HEAD WITHOUT AND WITH CONTRAST TECHNIQUE: Multiplanar, multiecho pulse sequences of the brain and surrounding structures were obtained without and with intravenous contrast. CONTRAST:  7.51mL GADAVIST GADOBUTROL 1 MMOL/ML IV SOLN COMPARISON:  Head CT 09/27/2019, brain MRI 02/15/2012 FINDINGS: Brain: Within the right cerebellopontine angle, there is a multi-cystic mass with internal enhancing septations measuring 4.7 x 3.4 x 4.1 cm (AP x TV x CC). There is associated abnormal enhancement which extends into the right internal auditory canal to the level of the fundus. This mass was present on prior MRI 02/15/2012 and has increased in size since that time. There is significant associated mass effect upon the right cerebellopontine and cerebellomedullary angle with mild adjacent parenchymal edema and partial effacement of the fourth ventricle. No hydrocephalus. The mass comes in close proximity to, but does not appear to encase, the V4 right vertebral artery and basilar artery. As compared to prior MRI 02/15/2012, there has been an interval increase in size of a hypoenhancing mass centered within the left aspect of the sella, now measuring 17 x 14 mm in transaxial dimensions (previously 8 x 10 mm). There is resultant rightward displacement of normal pituitary tissue and of the pituitary stalk. There is suprasellar extension without mass effect upon the optic chiasm. There is no evidence of acute infarct. No extra-axial fluid collection. No chronic intracranial blood products. Mild scattered T2/FLAIR hyperintensity within the cerebral white matter has progressed from prior exam 02/15/2012 and is  nonspecific, but consistent with chronic small vessel ischemic disease. Cerebral volume is normal for age. Vascular: Flow voids maintained within the proximal large arterial vessels. Skull and upper cervical spine: No focal marrow lesion. Sinuses/Orbits: Visualized orbits demonstrate no acute abnormality. Mild ethmoid sinus mucosal thickening. Small bilateral maxillary sinus mucous retention cysts. No significant mastoid effusion. IMPRESSION: 1. 4.7 x 3.4 x 4.1 cm multi-cystic right cerebellopontine angle mass with associated abnormal enhancement extending into the right internal auditory canal. 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 Significant associated mass effect upon the right cerebellopontine and cerebellomedullary angle with partial effacement of the fourth ventricle and mild adjacent parenchymal edema. No hydrocephalus. 2. A 17 x 14 mm hypoenhancing mass within the left aspect of the sella has increased in size as compared MRI 02/15/2012. 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 Correlate with relevant laboratory values. 3. Mild chronic small vessel ischemic, progressed as compared to 02/15/2012. 4. Mild ethmoid sinus mucosal thickening. Small bilateral maxillary sinus mucous retention cysts. Electronically Signed   By: Kellie Simmering DO   On: 09/30/2019 15:35  Scheduled Meds: . cholecalciferol  4,000 Units Oral Daily  . dexamethasone (DECADRON) injection  4 mg Intravenous Q6H  . heparin  5,000 Units Subcutaneous Q8H  . melatonin  3 mg Oral QHS  . multivitamin with minerals  1 tablet Oral Daily  . senna  2 tablet Oral QHS  . cyanocobalamin  2,000 mcg Oral Daily   Continuous Infusions:   LOS: 4 days   Time spent= 35 mins    Hao Dion Arsenio Loader, MD [facility]  If 7PM-7AM, please contact night-coverage  10/01/2019, 8:04 AM

## 2019-10-01 NOTE — Progress Notes (Signed)
The chaplain responded to a request for a conversation on AD. The patient expressed a concern that his "House" was no in order and wanted to engage in a conversation concerning AD as well as POA.The chaplain provided information concerning an AD as well as paperwork. The chaplain will follow-up concerning the AD in the morning.  Brion Aliment Chaplain Resident For questions concerning this note please contact me by pager [phone]

## 2019-10-01 NOTE — Progress Notes (Addendum)
Neurosurgery Service Progress Note  Subjective: Taking over for Dr. Marcello Moores, given the patient's skull base pathology. No new complaints this morning.   Objective: Vitals:   09/30/19 1948 10/01/19 0001 10/01/19 0412 10/01/19 0930  BP: (!) 146/83 135/86 (!) 150/93 124/78  Pulse: 70 70 69   Resp: 16 15 14 17   Temp: 97.6 F (36.4 C) 97.8 F (36.6 C) (!) 97.5 F (36.4 C) 97.8 F (36.6 C)  TempSrc: Oral Oral Oral Oral  SpO2: 93% 95% 95% 94%   Temp (24hrs), Avg:97.7 F (36.5 C), Min:97.5 F (36.4 C), Max:98 F (36.7 C)  CBC Latest Ref Rng & Units 09/28/2019 09/27/2019 09/27/2019  WBC 4.0 - 10.5 K/uL 7.9 7.9 7.0  Hemoglobin 13.0 - 17.0 g/dL 13.4 13.5 12.8(L)  Hematocrit 39.0 - 52.0 % 41.0 40.9 38.6(L)  Platelets 150 - 400 K/uL 252 224 198   BMP Latest Ref Rng & Units 09/28/2019 09/27/2019 09/27/2019  Glucose 70 - 99 mg/dL 134(H) - 91  BUN 8 - 23 mg/dL 11 - 14  Creatinine 0.61 - 1.24 mg/dL 0.71 0.69 0.62  Sodium 135 - 145 mmol/L 139 - 140  Potassium 3.5 - 5.1 mmol/L 4.0 - 4.3  Chloride 98 - 111 mmol/L 100 - 101  CO2 22 - 32 mmol/L 29 - 30  Calcium 8.9 - 10.3 mg/dL 9.1 - 8.5(L)    Intake/Output Summary (Last 24 hours) at 10/01/2019 1129 Last data filed at 10/01/2019 0424 Gross per 24 hour  Intake --  Output 2000 ml  Net -2000 ml    Current Facility-Administered Medications:  .  cholecalciferol (VITAMIN D3) tablet 4,000 Units, 4,000 Units, Oral, Daily, Rai, Ripudeep K, MD, 4,000 Units at 10/01/19 0937 .  dexamethasone (DECADRON) injection 4 mg, 4 mg, Intravenous, Q6H, Rai, Ripudeep K, MD, 4 mg at 10/01/19 0533 .  heparin injection 5,000 Units, 5,000 Units, Subcutaneous, Q8H, Rai, Ripudeep K, MD, 5,000 Units at 10/01/19 0533 .  insulin aspart (novoLOG) injection 0-6 Units, 0-6 Units, Subcutaneous, TID WC, Amin, Ankit Chirag, MD, 1 Units at 10/01/19 1015 .  melatonin tablet 3 mg, 3 mg, Oral, QHS, Blount, Xenia T, NP, 3 mg at 09/30/19 2239 .  multivitamin with minerals tablet 1 tablet, 1  tablet, Oral, Daily, Rai, Ripudeep K, MD, 1 tablet at 10/01/19 0937 .  ondansetron (ZOFRAN) tablet 4 mg, 4 mg, Oral, Q6H PRN **OR** ondansetron (ZOFRAN) injection 4 mg, 4 mg, Intravenous, Q6H PRN, Rai, Ripudeep K, MD .  polyethylene glycol (MIRALAX / GLYCOLAX) packet 17 g, 17 g, Oral, Daily PRN, Amin, Ankit Chirag, MD .  senna (SENOKOT) tablet 17.2 mg, 2 tablet, Oral, QHS, Hongalgi, Anand D, MD, 17.2 mg at 09/30/19 2238 .  senna-docusate (Senokot-S) tablet 2 tablet, 2 tablet, Oral, QHS PRN, Amin, Ankit Chirag, MD .  vitamin B-12 (CYANOCOBALAMIN) tablet 2,000 mcg, 2,000 mcg, Oral, Daily, Rai, Ripudeep K, MD, 2,000 mcg at 10/01/19 [id number]  Facility-Administered Medications Ordered in Other Encounters:  .  fluconazole (DIFLUCAN) IVPB 100 mg, 100 mg, Intravenous, Q24H, Raynelle Bring, MD   Physical Exam: AOx3 but poor short term recall, PERRL, EOM exam w/ partial 6th OD, R HB2 facial weakness - eye closure complete but very slow, dense R V1-V3 numbness, some pharyngeal asymmetry, +nasal character to voice, strength 5/5x4, SILTx4 but some mild L sided subjective numbness in LUE > RUE, dense R hearing loss  Assessment & Plan: 64 y.o. 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 Also notable for left sellar mass, not compressing the chiasm.  -OR tomorrow for resection of the posterior fossa mass, NPO after midnight -already on dex, will send off pituitary labs after he's off steroids as an outpatient, will treat the sellar mass in a delayed fashion  Judith Part  10/01/19 11:29 AM

## 2019-10-01 NOTE — Evaluation (Signed)
Physical Therapy Evaluation Patient Details Name: Casey Richard MRN: [id number] DOB: Sep 07, 1955 Today's Date: 10/01/2019   History of Present Illness  Pt is a 64 y/o male admitted secondary to dizziness and instability. 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 Also notable for left sellar mass. PMH includes CKD and complete heart block s/p pacemaker.   Clinical Impression  Pt admitted secondary to problem above with deficits below. Pt very anxious during session and very fixated on "getting his affairs in order". Requesting multiple times during session to designate POA and to postpone surgery and had multiple calls during session from family members attempting to assist with organizing external affairs. RN notified about all concerns. Required redirection to task quite frequently. Pt also scratching himself on BLE and had one spot that bled on LLE. Required min A to take steps at EOB and to march in place. Pt likely to OR for removal of mass, so will need to determine most appropriate d/c location following surgery. 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 Will continue to follow acutely and update recommendations pending pt progression.     Follow Up Recommendations Other (comment)(TBD pending surgery )    Equipment Recommendations  Other (comment)(TBD pending surgery)    Recommendations for Other Services       Precautions / Restrictions Precautions Precautions: Fall Restrictions Weight Bearing Restrictions: No      Mobility  Bed Mobility Overal bed mobility: Needs Assistance Bed Mobility: Supine to Sit;Sit to Supine     Supine to sit: Supervision Sit to supine: Supervision   General bed mobility comments: Supervision for safety to come to sitting.   Transfers Overall transfer level: Needs assistance Equipment used: 1 person hand held assist Transfers: Sit to/from Stand Sit to Stand: Min assist          General transfer comment: Min A for steadying assist.   Ambulation/Gait Ambulation/Gait assistance: Min assist Gait Distance (Feet): 2 Feet Assistive device: 1 person hand held assist       General Gait Details: Took side steps to and from EOB. Min A for steadying assist. Pt reaching out for handle on window for support; had to give cues to hold to PT instead of window.   Stairs            Wheelchair Mobility    Modified Rankin (Stroke Patients Only)       Balance Overall balance assessment: Needs assistance Sitting-balance support: No upper extremity supported;Feet supported Sitting balance-Leahy Scale: Fair     Standing balance support: Single extremity supported;During functional activity Standing balance-Leahy Scale: Poor Standing balance comment: Reliant on at least one UE support.                              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     Prior Function Level of Independence: Independent with assistive device(s)         Comments: Had tried using cane, however, reports increased instability.      Hand Dominance  Extremity/Trunk Assessment   Upper Extremity Assessment Upper Extremity Assessment: Defer to OT evaluation    Lower Extremity Assessment Lower Extremity Assessment: Generalized weakness    Cervical / Trunk Assessment Cervical / Trunk Assessment: Normal  Communication   Communication: No difficulties  Cognition Arousal/Alertness: Awake/alert Behavior During Therapy: Anxious Overall  Cognitive Status: No family/caregiver present to determine baseline cognitive functioning                                 General Comments: Pt very anxious and very fixated on getting his "affairs straightened out". Required redirection to tasks multiple times throughout. Voicing concerns that he will be incapacitated following surgery and wanting to postpone.       General Comments      Exercises     Assessment/Plan    PT Assessment Patient needs continued PT services  PT Problem List Decreased strength;Decreased mobility;Decreased balance;Decreased cognition;Decreased knowledge of use of DME;Decreased safety awareness;Decreased knowledge of precautions       PT Treatment Interventions Gait training;DME instruction;Stair training;Functional mobility training;Therapeutic activities;Therapeutic exercise;Balance training;Cognitive remediation;Patient/family education    PT Goals (Current goals can be found in the Care Plan section)  Acute Rehab PT Goals Patient Stated Goal: "to get my affairs in order before surgery"  PT Goal Formulation: With patient Time For Goal Achievement: 10/15/19 Potential to Achieve Goals: Good    Frequency Min 3X/week   Barriers to discharge Decreased caregiver support      Co-evaluation               AM-PAC PT "6 Clicks" Mobility  Outcome Measure Help needed turning from your back to your side while in a flat bed without using bedrails?: None Help needed moving from lying on your back to sitting on the side of a flat bed without using bedrails?: None Help needed moving to and from a bed to a chair (including a wheelchair)?: A Little Help needed standing up from a chair using your arms (e.g., wheelchair or bedside chair)?: A Little Help needed to walk in hospital room?: A Little Help needed climbing 3-5 steps with a railing? : A Lot 6 Click Score: 19    End of Session Equipment Utilized During Treatment: Gait belt Activity  Tolerance: Patient tolerated treatment well Patient left: in bed;with call bell/phone within reach;with bed alarm set Nurse Communication: Mobility status;Other (comment)(Pt wanting to post pone surgery; pt wanting to designate POA) PT Visit Diagnosis: Unsteadiness on feet (R26.81);Muscle weakness (generalized) (M62.81);History of falling (Z91.81)    Time: 1436-1510 PT Time Calculation (min) (ACUTE ONLY): 34 min   Charges:   PT Evaluation $PT Eval Moderate Complexity: 1 Mod PT Treatments $Therapeutic Activity: 8-22 mins        Lou Miner, DPT  Acute Rehabilitation Services  Pager: [phone] Office: [phone]   Rudean Hitt 10/01/2019, 5:20 PM

## 2019-10-02 DIAGNOSIS — G9389 Other specified disorders of brain: Secondary | ICD-10-CM | POA: Diagnosis not present

## 2019-10-02 LAB — ACTH

## 2019-10-02 LAB — CBC
HCT: 43 % (ref 39.0–52.0)
Hemoglobin: 14.3 g/dL (ref 13.0–17.0)
MCH: 32.4 pg (ref 26.0–34.0)
MCHC: 33.3 g/dL (ref 30.0–36.0)
MCV: 97.3 fL (ref 80.0–100.0)
Platelets: 243 10*3/uL (ref 150–400)
RBC: 4.42 MIL/uL (ref 4.22–5.81)
RDW: 13.7 % (ref 11.5–15.5)
WBC: 15.9 10*3/uL — ABNORMAL HIGH (ref 4.0–10.5)
nRBC: 0 % (ref 0.0–0.2)

## 2019-10-02 LAB — MAGNESIUM: Magnesium: 2.1 mg/dL (ref 1.7–2.4)

## 2019-10-02 LAB — BASIC METABOLIC PANEL
Anion gap: 11 (ref 5–15)
BUN: 20 mg/dL (ref 8–23)
CO2: 29 mmol/L (ref 22–32)
Calcium: 8.7 mg/dL — ABNORMAL LOW (ref 8.9–10.3)
Chloride: 97 mmol/L — ABNORMAL LOW (ref 98–111)
Creatinine, Ser: 0.71 mg/dL (ref 0.61–1.24)
GFR calc Af Amer: >60 mL/min (ref >60)
GFR calc non Af Amer: >60 mL/min (ref >60)
Glucose, Bld: 111 mg/dL — ABNORMAL HIGH (ref 70–99)
Potassium: 4.1 mmol/L (ref 3.5–5.1)
Sodium: 137 mmol/L (ref 135–145)

## 2019-10-02 LAB — PROLACTIN: Prolactin: 9.4 ng/mL (ref 4.0–15.2)

## 2019-10-02 LAB — GLUCOSE, CAPILLARY
Glucose-Capillary: 117 mg/dL — ABNORMAL HIGH (ref 70–99)
Glucose-Capillary: 94 mg/dL (ref 70–99)
Glucose-Capillary: 111 mg/dL — ABNORMAL HIGH (ref 70–99)
Glucose-Capillary: 164 mg/dL — ABNORMAL HIGH (ref 70–99)

## 2019-10-02 LAB — INSULIN-LIKE GROWTH FACTOR: Somatomedin C: 205 ng/mL (ref 64–240)

## 2019-10-02 LAB — FOLLICLE STIMULATING HORMONE: FSH: 10.2 m[IU]/mL (ref 1.5–12.4)

## 2019-10-02 LAB — LUTEINIZING HORMONE: LH: 3.3 m[IU]/mL (ref 1.7–8.6)

## 2019-10-02 MED ORDER — ENSURE ENLIVE PO LIQD
237.0000 mL | Freq: Three times a day (TID) | ORAL | Status: DC
  Administered 2019-10-02 – 2019-10-10 (×21): 237 mL via ORAL

## 2019-10-02 NOTE — Progress Notes (Signed)
Physical Therapy Treatment Patient Details Name: Casey Richard MRN: [id number] DOB: [birth year] Today's Date: 10/02/2019    History of Present Illness Pt is a 64 y/o male admitted secondary to dizziness and instability. 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 Also notable for left sellar mass. Pt scheduled for surgery for mass removal on 4/19. PMH includes CKD and complete heart block s/p pacemaker.    PT Comments    Pt demonstrated improved mobility today.  Does continue to need cues for safety and to focus on task at hand for safety.  Ambulated in hall with hand rail.  Without UE supported tended to drift L.  Will continue to benefit from PT to advance mobility and will need further assessment post op.     Follow Up Recommendations  Home health PT;Supervision for mobility/OOB;Other (comment)(Will need further assessment post-op)     Equipment Recommendations  Rolling walker with 5" wheels;Other (comment)(will need further assessment post op)    Recommendations for Other Services       Precautions / Restrictions Precautions Precautions: Fall    Mobility  Bed Mobility Overal bed mobility: Needs Assistance Bed Mobility: Supine to Sit;Sit to Supine     Supine to sit: Supervision Sit to supine: Supervision      Transfers Overall transfer level: Needs assistance Equipment used: None Transfers: Sit to/from Stand Sit to Stand: Min guard            Ambulation/Gait Ambulation/Gait assistance: Min guard Gait Distance (Feet): 300 Feet Assistive device: (handrail) Gait Pattern/deviations: Narrow base of support;Staggering left Gait velocity: decreased   General Gait Details: Pt able to ambulate at fair speed.  Used handrail in hall.  When rail not available pt tending to drift L and occasional scissor gait.  Cued for BOS and balance.   Stairs             Wheelchair Mobility    Modified Rankin (Stroke Patients  Only)       Balance     Sitting balance-Leahy Scale: Normal       Standing balance-Leahy Scale: Fair Standing balance comment: Required UE support - with UE support no LOB                            Cognition Arousal/Alertness: Awake/alert Behavior During Therapy: WFL for tasks assessed/performed Overall Cognitive Status: No family/caregiver present to determine baseline cognitive functioning                                 General Comments: A and Ox 4; follows commands.  Does require redirection at times and to focus on balance while walkign      Exercises      General Comments General comments (skin integrity, edema, etc.): VSS      Pertinent Vitals/Pain Pain Assessment: No/denies pain    Home Living                      Prior Function            PT Goals (current goals can now be found in the care plan section) Acute Rehab PT Goals Patient Stated Goal: to go home PT Goal Formulation: With patient Time For Goal Achievement: 10/15/19 Potential to Achieve Goals: Good Progress towards PT goals: Progressing toward goals    Frequency    Min 3X/week  PT Plan Current plan remains appropriate    Co-evaluation              AM-PAC PT "6 Clicks" Mobility   Outcome Measure  Help needed turning from your back to your side while in a flat bed without using bedrails?: None Help needed moving from lying on your back to sitting on the side of a flat bed without using bedrails?: None Help needed moving to and from a bed to a chair (including a wheelchair)?: None Help needed standing up from a chair using your arms (e.g., wheelchair or bedside chair)?: None Help needed to walk in hospital room?: A Little Help needed climbing 3-5 steps with a railing? : A Lot 6 Click Score: 21    End of Session Equipment Utilized During Treatment: Gait belt Activity Tolerance: Patient tolerated treatment well Patient left: in bed;with  call bell/phone within reach;with bed alarm set Nurse Communication: Mobility status PT Visit Diagnosis: Unsteadiness on feet (R26.81);Muscle weakness (generalized) (M62.81);History of falling (Z91.81)     Time: 1535-1550 PT Time Calculation (min) (ACUTE ONLY): 15 min  Charges:  $Gait Training: 8-22 mins                     Maggie Font, PT Acute Rehab Services Pager [phone] [facility] [phone] [facility] [phone]    Casey Richard 10/02/2019, 4:16 PM

## 2019-10-02 NOTE — Progress Notes (Signed)
Initial Nutrition Assessment  **RD working remotely**  DOCUMENTATION CODES:   Not applicable  INTERVENTION:   Ensure Enlive po TID, each supplement provides 350 kcal and 20 grams of protein  Magic cup TID with meals, each supplement provides 290 kcal and 9 grams of protein  Liberalized diet to regular   Continue MVI daily  NUTRITION DIAGNOSIS:   Inadequate oral intake related to poor appetite as evidenced by per patient/family report.   GOAL:   Patient will meet greater than or equal to 90% of their needs   MONITOR:   PO intake, Supplement acceptance, Weight trends, Labs  REASON FOR ASSESSMENT:   Consult Assessment of nutrition requirement/status  ASSESSMENT:   Pt with a PMH significant for complete heart block s/p PPM who presented to the hospital for worsening dizziness, gait instability, and weight loss. MRI revealed 2 large brain masses.  Discussed pt with MD, diet liberalized to regular.   Per neurosurgery, surgical plans for 10/07/19.   Pt reports appetite has been declining over the last year due to a loss of taste. Pt states he has been primarily eating canned soups with the occasional Boost protein supplement. Pt agreeable to use of Ensure while admitted. Pt states "I'll do whatever you need me to."  Limited weight history available in chart from the last year. Pt noted to have weighed 63.8 kg (140.36 lbs) on 09/05/19 and 68 kg (149.6 lbs) on 09/16/19. Outside of those weights, last available weight is from 02/14/2018 when pt weighed 73.5 kg (161.7 lbs). This would indicate a 7-13% wt loss x19 months, which is not significant for time frame. Per pt, he has experienced a 40 lbs wt loss over the last year (from 180 lbs to 140 lbs). This would indicate a 22% wt loss x1 year, which would be significant for time frame.   Malnutrition is suspected; however, cannot diagnose at this time without nutrition-focused physical exam. RD will perform at follow-up.   PO  Intake: 75-100% x 4 recorded meals  UOP: 1,366ml x24 hours I/O: -6,097.25ml since admit  Labs reviewed.  Medications reviewed and include: Vitamin D3, Decadron, Novolog, MVI, Senokot, Vitamin B12   NUTRITION - FOCUSED PHYSICAL EXAM:  RD unable to perform at this time.   Diet Order:   Diet Order            Diet regular Room service appropriate? Yes; Fluid consistency: Thin  Diet effective now              EDUCATION NEEDS:   Not appropriate for education at this time  Skin:  Skin Assessment: Reviewed RN Assessment  Last BM:  4/7  Height:   Ht Readings from Last 1 Encounters:  09/16/19 5\' 10"  (1.778 m)    Weight:   Wt Readings from Last 6 Encounters:  09/16/19 68 kg  09/05/19 63.5 kg  02/14/18 73.5 kg  02/03/17 71.7 kg  10/27/15 74 kg  10/07/14 82.3 kg    BMI:  There is no height or weight on file to calculate BMI.  Estimated Nutritional Needs:   Kcal:  2050-2250  Protein:  100-115 grams  Fluid:  >2L/d   Casey Ina, MS, RD, LDN RD pager number and weekend/on-call pager number located in [place].

## 2019-10-02 NOTE — Progress Notes (Signed)
PROGRESS NOTE    Casey Richard  [id number] DOB: December 06, 1955 DOA: 09/27/2019 PCP: Benito Mccreedy, MD   Brief Narrative:  64 year old with history of complete heart block status post PPM admitted to the hospital for worsening dizziness, gait instability, weight loss.  CT showed new large cystic mass in the right cerebellopontine area with mass-effect on pons and cerebellum.  MRI confirmed 2 large brain masses.  Neurosurgery team consulted.  IV Decadron started.   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  Accu-Chek sliding scale while on Decadron.  No need for seizure prophylaxis per neurosurgery -Supportive care -PT/OT -(Labs sent  History of bradycardia/V. fib arrest in 2013 Status post pacemaker -[facility] outpatient cardiology Dr Sallyanne Kuster.  History of urinary retention -Follows with Dr. Alinda Money from urology outpatient. -Urine cultures growing Staph epidermidis.  Currently he is asymptomatic  Severe weight loss -Secondary to underlying malignancy.  Encourage p.o. intake. -Consult dietitian   DVT prophylaxis: Subcutaneous heparin Code Status: Full code Family Communication:   Disposition Plan:   Patient From= home  Patient Anticipated D/C place= to be determined  Barriers= maintain hospital stay until input from neurosurgery regarding his brain mass.  Subjective: Seen and examined at bedside, no complaints.  Very worried about his upcoming surgery given he is the sole provider for his children  Review of Systems Otherwise negative except as per HPI,  including: General: Denies fever, chills, night sweats or unintended weight loss. Resp: Denies cough, wheezing, shortness of breath. Cardiac: Denies chest pain, palpitations, orthopnea, paroxysmal nocturnal dyspnea. GI: Denies abdominal pain, nausea, vomiting, diarrhea or constipation GU: Denies dysuria, frequency, hesitancy or incontinence MS: Denies muscle aches, joint pain or swelling Neuro: Denies headache, neurologic deficits (focal weakness, numbness, tingling), abnormal gait Psych: Denies anxiety, depression, SI/HI/AVH Skin: Denies new rashes or lesions ID: Denies sick contacts, exotic exposures, travel  Examination:  Constitutional: Not in acute distress Respiratory: Clear to auscultation bilaterally Cardiovascular: Normal sinus rhythm, no rubs Abdomen: Nontender nondistended good bowel sounds Musculoskeletal: No edema noted Skin: No rashes seen Neurologic: CN 2-12 grossly intact.  And nonfocal Psychiatric: Normal judgment and insight. Alert and oriented x 3. Normal mood.  Objective: Vitals:   10/01/19 2351 10/02/19 0423 10/02/19 0902 10/02/19 1140  BP:   134/80 (!) 141/86  Pulse:   72   Resp: 13  12 12   Temp: 97.7 F (36.5 C) 98 F (36.7 C) 97.9 F (36.6 C) 97.9 F (36.6 C)  TempSrc: Oral Oral Oral Oral  SpO2: 98% 95% 99% 99%    Intake/Output Summary (Last 24 hours) at 10/02/2019 1208 Last data filed at 10/02/2019 1142 Gross per 24 hour  Intake --  Output 1450 ml  Net -1450 ml   There were no vitals filed for this visit.   Data Reviewed:   CBC: Recent Labs  Lab 09/27/19 1337 09/27/19 1943 09/28/19 0449 10/02/19 0406  WBC 7.0 7.9 7.9 15.9*  NEUTROABS 4.9  --   --   --   HGB 12.8* 13.5 13.4 14.3  HCT 38.6* 40.9 41.0 43.0  MCV 101.6* 98.1 98.6 97.3  PLT 198 224 252 243   Basic  Metabolic Panel: Recent Labs  Lab 09/27/19 1337 09/27/19 1943 09/28/19 0449 10/02/19 0406  NA 140  --  139 137  K 4.3  --  4.0 4.1  CL 101  --  100 97*  CO2 30  --   29 29  GLUCOSE 91  --  134* 111*  BUN 14  --  11 20  CREATININE 0.62 0.69 0.71 0.71  CALCIUM 8.5*  --  9.1 8.7*  MG  --   --   --  2.1   GFR: CrCl cannot be calculated (Unknown ideal weight.). Liver Function Tests: Recent Labs  Lab 09/27/19 1337  AST 18  ALT 25  ALKPHOS 52  BILITOT 0.9  PROT 6.6  ALBUMIN 3.7   No results for input(s): LIPASE, AMYLASE in the last 168 hours. No results for input(s): AMMONIA in the last 168 hours. Coagulation Profile: No results for input(s): INR, PROTIME in the last 168 hours. Cardiac Enzymes: No results for input(s): CKTOTAL, CKMB, CKMBINDEX, TROPONINI in the last 168 hours. BNP (last 3 results) No results for input(s): PROBNP in the last 8760 hours. HbA1C: No results for input(s): HGBA1C in the last 72 hours. CBG: Recent Labs  Lab 10/01/19 1703 10/01/19 1953 10/01/19 2146 10/02/19 0625 10/02/19 1137  GLUCAP 119* 102* 138* 117* 94   Lipid Profile: No results for input(s): CHOL, HDL, LDLCALC, TRIG, CHOLHDL, LDLDIRECT in the last 72 hours. Thyroid Function Tests: Recent Labs    10/01/19 0821 10/01/19 1932  TSH 0.661  --   FREET4  --  0.67   Anemia Panel: No results for input(s): VITAMINB12, FOLATE, FERRITIN, TIBC, IRON, RETICCTPCT in the last 72 hours. Sepsis Labs: No results for input(s): PROCALCITON, LATICACIDVEN in the last 168 hours.  Recent Results (from the past 240 hour(s))  Urine culture     Status: Abnormal   Collection Time: 09/27/19  1:37 PM   Specimen: Urine, Random  Result Value Ref Range Status   Specimen Description   Final    URINE, RANDOM Performed at [facility] [address], [place], [place] [address]    Special Requests   Final    NONE Performed at [facility], [place] [address] [place], [place] [address]    Culture >=100,000 COLONIES/mL STAPHYLOCOCCUS EPIDERMIDIS (A)  Final   Report Status 09/29/2019 FINAL  Final   Organism ID, Bacteria STAPHYLOCOCCUS  EPIDERMIDIS (A)  Final      Susceptibility   Staphylococcus epidermidis - MIC*    CIPROFLOXACIN <=0.5 SENSITIVE Sensitive     GENTAMICIN <=0.5 SENSITIVE Sensitive     NITROFURANTOIN <=16 SENSITIVE Sensitive     OXACILLIN >=4 RESISTANT Resistant     TETRACYCLINE >=16 RESISTANT Resistant     VANCOMYCIN 2 SENSITIVE Sensitive     TRIMETH/SULFA <=10 SENSITIVE Sensitive     CLINDAMYCIN RESISTANT Resistant     RIFAMPIN <=0.5 SENSITIVE Sensitive     Inducible Clindamycin POSITIVE Resistant     * >=100,000 COLONIES/mL STAPHYLOCOCCUS EPIDERMIDIS  SARS CORONAVIRUS 2 (TAT 6-24 HRS) Nasopharyngeal Nasopharyngeal Swab     Status: None   Collection Time: 09/27/19  5:13 PM   Specimen: Nasopharyngeal Swab  Result Value Ref Range Status   SARS Coronavirus 2 NEGATIVE NEGATIVE Final    Comment: (NOTE) SARS-CoV-2 target nucleic acids are NOT DETECTED. The SARS-CoV-2 RNA is generally detectable in upper and lower respiratory specimens during the acute phase of infection. Negative results do not preclude SARS-CoV-2 infection, do not rule out co-infections with  other pathogens, and should not be used as the sole basis for treatment or other patient management decisions. Negative results must be combined with clinical observations, patient history, and epidemiological information. The expected result is Negative. Fact Sheet for Patients: SugarRoll.be Fact Sheet for Healthcare Providers: https://www.woods-mathews.com/ This test is not yet approved or cleared by the [place] FDA and  has been authorized for detection and/or diagnosis of SARS-CoV-2 by FDA under an Emergency Use Authorization (EUA). This EUA will remain  in effect (meaning this test can be used) for the duration of the COVID-19 declaration under Section 56 4(b)(1) of the Act, 21 U.S.C. section 360bbb-3(b)(1), unless the authorization is terminated or revoked sooner. Performed at [facility], [place] [address], [place],  [address]          Radiology Studies: MR Brain W and Wo Contrast  Result Date: 09/30/2019 CLINICAL DATA:  Provided history: Patient presents with dizziness, falls, memory issues in the last few months, progressively worsening. EXAM: MRI HEAD WITHOUT AND WITH CONTRAST TECHNIQUE: Multiplanar, multiecho pulse sequences of the brain and surrounding structures were obtained without and with intravenous contrast. CONTRAST:  7.75mL GADAVIST GADOBUTROL 1 MMOL/ML IV SOLN COMPARISON:  Head CT 09/27/2019, brain MRI 02/15/2012 FINDINGS: Brain: Within the right cerebellopontine angle, there is a multi-cystic mass with internal enhancing septations measuring 4.7 x 3.4 x 4.1 cm (AP x TV x CC). There is associated abnormal enhancement which extends into the right internal auditory canal to the level of the fundus. This mass was present on prior MRI 02/15/2012 and has increased in size since that time. There is significant associated mass effect upon the right cerebellopontine and cerebellomedullary angle with mild adjacent parenchymal edema and partial effacement of the fourth ventricle. No hydrocephalus. The mass comes in close proximity to, but does not appear to encase, the V4 right vertebral artery and basilar artery. As compared to prior MRI 02/15/2012, there has been an interval increase in size of a hypoenhancing mass centered within the left aspect of the sella, now measuring 17 x 14 mm in transaxial dimensions (previously 8 x 10 mm). There is resultant rightward displacement of normal pituitary tissue and of the pituitary stalk. There is suprasellar extension without mass effect upon the optic chiasm. There is no evidence of acute infarct. No extra-axial fluid collection. No chronic intracranial blood products. Mild scattered T2/FLAIR hyperintensity within the cerebral white matter has progressed from prior exam 02/15/2012 and is nonspecific, but consistent with chronic  small vessel ischemic disease. Cerebral volume is normal for age. Vascular: Flow voids maintained within the proximal large arterial vessels. Skull and upper cervical spine: No focal marrow lesion. Sinuses/Orbits: Visualized orbits demonstrate no acute abnormality. Mild ethmoid sinus mucosal thickening. Small bilateral maxillary sinus mucous retention cysts. No significant mastoid effusion. IMPRESSION: 1. 4.7 x 3.4 x 4.1 cm multi-cystic right cerebellopontine angle mass with associated abnormal enhancement extending into the right internal auditory canal. 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 Significant associated mass effect upon the right cerebellopontine and cerebellomedullary angle with partial effacement of the fourth ventricle and mild adjacent parenchymal edema. No hydrocephalus. 2. A 17 x 14 mm hypoenhancing mass within the left aspect of the sella has increased in size as compared MRI 02/15/2012. 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 Correlate with relevant laboratory values. 3. Mild chronic small vessel ischemic, progressed as compared to 02/15/2012. 4. Mild ethmoid sinus mucosal thickening. Small bilateral  maxillary sinus mucous retention cysts. Electronically Signed   By: Kellie Simmering DO   On: 09/30/2019 15:35        Scheduled Meds: . cholecalciferol  4,000 Units Oral Daily  . dexamethasone (DECADRON) injection  4 mg Intravenous Q6H  . heparin  5,000 Units Subcutaneous Q8H  . insulin aspart  0-6 Units Subcutaneous TID WC  . melatonin  3 mg Oral QHS  . multivitamin with minerals  1 tablet Oral Daily  . senna  2 tablet Oral QHS  . cyanocobalamin  2,000 mcg Oral Daily   Continuous Infusions:   LOS: 5 days   Time spent= 35 mins    [employer] Arsenio Loader, MD [facility]  If 7PM-7AM, please contact night-coverage  10/02/2019, 12:08 PM

## 2019-10-02 NOTE — Progress Notes (Signed)
The chaplain visited with the patient to go over their AD paperwork. The patient informed the chaplain that their procedure was pushed back to next week giving the patient more time to finish their AD paperwork. The chaplain advised the patient of the importance of their paperwork and also assisted him by answering any questions. The chaplain will follow-up later this week to assist the patient with this paperwork.  Brion Aliment Chaplain Resident For questions concerning this note please contact me by pager [phone]

## 2019-10-02 NOTE — Progress Notes (Signed)
Occupational Therapy Evaluation Patient Details Name: Casey Richard MRN: [id number] DOB: 1955/12/12 Today's Date: 10/02/2019    History of Present Illness Pt is a 64 y/o male admitted secondary to dizziness and instability. 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 Also notable for left sellar mass. PMH includes CKD and complete heart block s/p pacemaker.    Clinical Impression   PTA pt resided with his sons, independent in mobility and ADL tasks. Pt reports using a cane the past few weeks due to increasing instability with pt reporting several falls in the last 6 months. Pt shares IADLs with sons. Pt currently independent to min assist for self-care and functional transfer tasks. Pt tolerated sitting EOB 10 min, demonstrating good balance while donning/doffing socks. Educated pt on hand placement for sit/stand with RW, noting 1 instance of retro LOB upon standing upright with pt requiring assist to self-correct. Pt able to ambulate to/from bedroom sink with RW and min assist for balance. Pt tolerated standing 1 x 10 min with min guard while completing grooming/hygiene tasks. Educated/instructed pt on safety strategies and activity pacing to increase balance with fair understanding. Pt required increased time and mod redirection back to tasks as pt is easily distracted by environment and personal conversation. Pt demonstrates decreased cognition, strength, endurance, balance, activity tolerance, safety awareness, and visual perceptual deficits impacting ability to complete self-care and functional transfer tasks. Recommend skilled OT services to address above deficits in order to promote function and prevent further decline.     Follow Up Recommendations  Other (comment)(TBD after surgery)    Equipment Recommendations  Other (comment)(TBD after surgery)    Recommendations for Other Services       Precautions / Restrictions Precautions Precautions:  Fall Restrictions Weight Bearing Restrictions: No      Mobility Bed Mobility Overal bed mobility: Needs Assistance Bed Mobility: Supine to Sit;Sit to Supine     Supine to sit: Supervision Sit to supine: Supervision   General bed mobility comments: HOB elevated, use of bedrail. Cues for safety  Transfers Overall transfer level: Needs assistance Equipment used: Rolling walker (2 wheeled) Transfers: Sit to/from Stand Sit to Stand: Min assist         General transfer comment: Retro LOB upon standing with pt requiring assist to self-correct.     Balance Overall balance assessment: Needs assistance Sitting-balance support: No upper extremity supported;Feet supported Sitting balance-Leahy Scale: Fair       Standing balance-Leahy Scale: Poor                             ADL either performed or assessed with clinical judgement   ADL Overall ADL's : Needs assistance/impaired Eating/Feeding: Independent;Sitting   Grooming: Wash/dry hands;Wash/dry face;Oral care;Applying deodorant;Brushing hair;Standing;Min guard Grooming Details (indicate cue type and reason): While standing at the sink Upper Body Bathing: Set up;Supervision/ safety;Sitting   Lower Body Bathing: Min guard;Sit to/from stand   Upper Body Dressing : Set up;Supervision/safety;Sitting   Lower Body Dressing: Min guard;Sit to/from stand   Toilet Transfer: Minimal assistance;BSC;Ambulation   Toileting- Clothing Manipulation and Hygiene: Minimal assistance;Sit to/from stand       Functional mobility during ADLs: Minimal assistance;Rolling walker General ADL Comments: Pt able to ambulate to/from bedroom sink with RW and min assist for balance. Pt tolerated standing 1 x 10 min at the sink to complete grooming and hygiene tasks.      Vision Baseline Vision/History: Wears glasses Wears  Glasses: Reading only Vision Assessment?: Yes Eye Alignment: Within Functional Limits Ocular Range of Motion:  Within Functional Limits Convergence: Impaired (comment)(Impaired on right) Depth Perception: Overshoots Additional Comments: Peripheral vision intact. Nystagmus noted during visual scanning.      Perception     Praxis      Pertinent Vitals/Pain Pain Assessment: Faces Faces Pain Scale: Hurts a little bit Pain Location: abdomen Pain Descriptors / Indicators: Discomfort Pain Intervention(s): Monitored during session;Repositioned     Hand Dominance Right   Extremity/Trunk Assessment Upper Extremity Assessment Upper Extremity Assessment: Overall WFL for tasks assessed   Lower Extremity Assessment Lower Extremity Assessment: Defer to PT evaluation       Communication Communication Communication: Other (comment)(Tangential speech)   Cognition Arousal/Alertness: Awake/alert Behavior During Therapy: WFL for tasks assessed/performed Overall Cognitive Status: No family/caregiver present to determine baseline cognitive functioning                                 General Comments: A&O x 4. Pt able to follow one step commands with repeat of instructions. Pt required mod redirection back to tasks as pt is easily distracted by environment and personal conversation.    General Comments  VSS on RA    Exercises     Shoulder Instructions      Home Living Family/patient expects to be discharged to:: Private residence Living Arrangements: Children Available Help at Discharge: Family Type of Home: House Home Access: Stairs to enter Technical brewer of Steps: 1 Entrance Stairs-Rails: None Home Layout: One level     Bathroom Shower/Tub: Teacher, early years/pre: Standard     Home Equipment: Cane - single point          Prior Functioning/Environment Level of Independence: Independent with assistive device(s)        Comments: Pt independent in ADLs and mobility. Pt reports sharing IADL responsibilities with sons. Pt reports using a cane the  past few weeks due to increasing instability. Pt reports several falls in the last 6 months.         OT Problem List: Decreased strength;Decreased range of motion;Decreased activity tolerance;Impaired balance (sitting and/or standing);Impaired vision/perception;Decreased coordination;Decreased safety awareness;Decreased cognition      OT Treatment/Interventions: Self-care/ADL training;Therapeutic exercise;Neuromuscular education;Energy conservation;DME and/or AE instruction;Therapeutic activities;Cognitive remediation/compensation;Visual/perceptual remediation/compensation;Patient/family education;Balance training    OT Goals(Current goals can be found in the care plan section) Acute Rehab OT Goals Patient Stated Goal: to go home Time For Goal Achievement: 10/16/19 Potential to Achieve Goals: Good ADL Goals Pt Will Perform Grooming: with supervision;standing Pt Will Perform Lower Body Bathing: with supervision;sit to/from stand Pt Will Perform Lower Body Dressing: with supervision;sit to/from stand Pt Will Transfer to Toilet: with supervision;ambulating Pt Will Perform Toileting - Clothing Manipulation and hygiene: with supervision;sit to/from stand Additional ADL Goal #1: Pt to recall and verbalize 3 fall prevention strategies with 0 verbal cues.  OT Frequency: Min 3X/week   Barriers to D/C:            Co-evaluation              AM-PAC OT "6 Clicks" Daily Activity     Outcome Measure Help from another person eating meals?: None Help from another person taking care of personal grooming?: A Little Help from another person toileting, which includes using toliet, bedpan, or urinal?: A Little Help from another person bathing (including washing, rinsing, drying)?: A Little Help from another person  to put on and taking off regular upper body clothing?: A Little Help from another person to put on and taking off regular lower body clothing?: A Little 6 Click Score: 19   End of  Session Equipment Utilized During Treatment: Gait belt;Rolling walker Nurse Communication: Mobility status  Activity Tolerance: Patient tolerated treatment well Patient left: in bed;with call bell/phone within reach;with bed alarm set  OT Visit Diagnosis: Unsteadiness on feet (R26.81);Repeated falls (R29.6);Muscle weakness (generalized) (M62.81)                Time: [id number] OT Time Calculation (min): 38 min Charges:  OT General Charges $OT Visit: 1 Visit OT Evaluation $OT Eval Low Complexity: 1 Low OT Treatments $Self Care/Home Management : 8-22 mins $Therapeutic Activity: 8-22 mins  Mauri Brooklyn OTR/L [phone]   Mauri Brooklyn 10/02/2019, 10:19 AM

## 2019-10-03 DIAGNOSIS — G9389 Other specified disorders of brain: Secondary | ICD-10-CM | POA: Diagnosis not present

## 2019-10-03 LAB — CBC
HCT: 42.9 % (ref 39.0–52.0)
Hemoglobin: 14.1 g/dL (ref 13.0–17.0)
MCH: 32 pg (ref 26.0–34.0)
MCHC: 32.9 g/dL (ref 30.0–36.0)
MCV: 97.3 fL (ref 80.0–100.0)
Platelets: 226 10*3/uL (ref 150–400)
RBC: 4.41 MIL/uL (ref 4.22–5.81)
RDW: 13.8 % (ref 11.5–15.5)
WBC: 15.9 10*3/uL — ABNORMAL HIGH (ref 4.0–10.5)
nRBC: 0 % (ref 0.0–0.2)

## 2019-10-03 LAB — MAGNESIUM: Magnesium: 2.3 mg/dL (ref 1.7–2.4)

## 2019-10-03 LAB — GLUCOSE, CAPILLARY
Glucose-Capillary: 126 mg/dL — ABNORMAL HIGH (ref 70–99)
Glucose-Capillary: 123 mg/dL — ABNORMAL HIGH (ref 70–99)
Glucose-Capillary: 98 mg/dL (ref 70–99)

## 2019-10-03 LAB — BASIC METABOLIC PANEL
Anion gap: 8 (ref 5–15)
BUN: 32 mg/dL — ABNORMAL HIGH (ref 8–23)
CO2: 30 mmol/L (ref 22–32)
Calcium: 8.9 mg/dL (ref 8.9–10.3)
Chloride: 99 mmol/L (ref 98–111)
Creatinine, Ser: 0.93 mg/dL (ref 0.61–1.24)
GFR calc Af Amer: >60 mL/min (ref >60)
GFR calc non Af Amer: >60 mL/min (ref >60)
Glucose, Bld: 142 mg/dL — ABNORMAL HIGH (ref 70–99)
Potassium: 4.8 mmol/L (ref 3.5–5.1)
Sodium: 137 mmol/L (ref 135–145)

## 2019-10-03 NOTE — Progress Notes (Signed)
PROGRESS NOTE    Casey Richard  [id number] DOB: 04-Jan-1956 DOA: 09/27/2019 PCP: Benito Mccreedy, MD   Brief Narrative:  64 year old with history of complete heart block status post PPM admitted to the hospital for worsening dizziness, gait instability, weight loss.  CT showed new large cystic mass in the right cerebellopontine area with mass-effect on pons and cerebellum.  MRI confirmed 2 large brain masses.  Neurosurgery team consulted.  IV Decadron started. Plans for surgery on 4/19 per neurosurgery.   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 No need for seizure prophylaxis  History of bradycardia/V. fib arrest in 2013 Status post pacemaker -Follows outpatient cardiology Dr Sallyanne Kuster.  History of urinary retention -Follows with Dr. Alinda Money from urology outpatient. -Urine cultures growing Staph epidermidis.  Currently he is asymptomatic  Severe weight loss -Secondary to underlying malignancy.  Encourage p.o. intake. -Consult dietitian  Chaplain service helping out with AD paperwork   DVT prophylaxis: Subcutaneous heparin Code Status: Full code Family Communication:   Disposition Plan:   Patient From= home  Patient Anticipated D/C place= to be determined  Barriers= maintain hospital stay until his surgery on 4/19. Postop plan to be determined according to his surgery.  Subjective: Feels okay no complaints  Review of Systems Otherwise negative except as per HPI, including: General: Denies fever, chills, night sweats or unintended weight loss. Resp: Denies cough,  wheezing, shortness of breath. Cardiac: Denies chest pain, palpitations, orthopnea, paroxysmal nocturnal dyspnea. GI: Denies abdominal pain, nausea, vomiting, diarrhea or constipation GU: Denies dysuria, frequency, hesitancy or incontinence MS: Denies muscle aches, joint pain or swelling Neuro: Denies headache, neurologic deficits (focal weakness, numbness, tingling), abnormal gait Psych: Denies anxiety, depression, SI/HI/AVH Skin: Denies new rashes or lesions ID: Denies sick contacts, exotic exposures, travel  Examination: Constitutional: Not in acute distress Respiratory: Clear to auscultation bilaterally Cardiovascular: Normal sinus rhythm, no rubs Abdomen: Nontender nondistended good bowel sounds Musculoskeletal: No edema noted Skin: No rashes seen Neurologic: CN 2-12 grossly intact.  And nonfocal Psychiatric: Normal judgment and insight. Alert and oriented x 3. Normal mood.   Objective: Vitals:   10/02/19 2057 10/03/19 0013 10/03/19 0300 10/03/19 0810  BP: 123/87 (!) 145/89 139/76 140/89  Pulse: 75 75 70   Resp: 18   14  Temp: 97.9 F (36.6 C) 97.7 F (36.5 C) 97.9 F (36.6 C) 97.7 F (36.5 C)  TempSrc: Oral Oral Oral Oral  SpO2: 96% 98% 96%     Intake/Output Summary (Last 24 hours) at 10/03/2019 0830 Last data filed at 10/02/2019 2200 Gross per 24 hour  Intake 580 ml  Output 1600 ml  Net -1020 ml   There were no vitals filed for this visit.   Data Reviewed:   CBC: Recent Labs  Lab 09/27/19 1337 09/27/19 1943 09/28/19 0449 10/02/19 0406 10/03/19 0412  WBC 7.0 7.9 7.9 15.9* 15.9*  NEUTROABS 4.9  --   --   --   --   HGB 12.8* 13.5 13.4 14.3 14.1  HCT 38.6* 40.9 41.0 43.0 42.9  MCV 101.6* 98.1 98.6 97.3 97.3  PLT 198 224 252 243 [id number]   Basic Metabolic Panel: Recent Labs  Lab 09/27/19 1337 09/27/19 1943 09/28/19 0449 10/02/19 0406 10/03/19 0412  NA 140  --  139 137 137  K 4.3  --  4.0 4.1 4.8  CL 101  --  100 97* 99  CO2 30  --  29 29 30     GLUCOSE 91  --  134* 111* 142*  BUN 14  --  11 20 32*  CREATININE 0.62 0.69 0.71 0.71 0.93  CALCIUM 8.5*  --  9.1 8.7* 8.9  MG  --   --   --  2.1 2.3   GFR: CrCl cannot be calculated (Unknown ideal weight.). Liver Function Tests: Recent Labs  Lab 09/27/19 1337  AST 18  ALT 25  ALKPHOS 52  BILITOT 0.9  PROT 6.6  ALBUMIN 3.7   No results for input(s): LIPASE, AMYLASE in the last 168 hours. No results for input(s): AMMONIA in the last 168 hours. Coagulation Profile: No results for input(s): INR, PROTIME in the last 168 hours. Cardiac Enzymes: No results for input(s): CKTOTAL, CKMB, CKMBINDEX, TROPONINI in the last 168 hours. BNP (last 3 results) No results for input(s): PROBNP in the last 8760 hours. HbA1C: No results for input(s): HGBA1C in the last 72 hours. CBG: Recent Labs  Lab 10/02/19 0625 10/02/19 1137 10/02/19 1719 10/02/19 2118 10/03/19 0643  GLUCAP 117* 94 111* 164* 126*   Lipid Profile: No results for input(s): CHOL, HDL, LDLCALC, TRIG, CHOLHDL, LDLDIRECT in the last 72 hours. Thyroid Function Tests: Recent Labs    10/01/19 0821 10/01/19 1932  TSH 0.661  --   FREET4  --  0.67   Anemia Panel: No results for input(s): VITAMINB12, FOLATE, FERRITIN, TIBC, IRON, RETICCTPCT in the last 72 hours. Sepsis Labs: No results for input(s): PROCALCITON, LATICACIDVEN in the last 168 hours.  Recent Results (from the past 240 hour(s))  Urine culture     Status: Abnormal   Collection Time: 09/27/19  1:37 PM   Specimen: Urine, Random  Result Value Ref Range Status   Specimen Description   Final    URINE, RANDOM Performed at [facility] [address] [place], [place] [address]    Special Requests   Final    NONE Performed at [facility], [place] [address], [place],  [address]    Culture >=100,000 COLONIES/mL STAPHYLOCOCCUS EPIDERMIDIS (A)  Final   Report Status 09/29/2019 FINAL  Final   Organism ID, Bacteria  STAPHYLOCOCCUS EPIDERMIDIS (A)  Final      Susceptibility   Staphylococcus epidermidis - MIC*    CIPROFLOXACIN <=0.5 SENSITIVE Sensitive     GENTAMICIN <=0.5 SENSITIVE Sensitive     NITROFURANTOIN <=16 SENSITIVE Sensitive     OXACILLIN >=4 RESISTANT Resistant     TETRACYCLINE >=16 RESISTANT Resistant     VANCOMYCIN 2 SENSITIVE Sensitive     TRIMETH/SULFA <=10 SENSITIVE Sensitive     CLINDAMYCIN RESISTANT Resistant     RIFAMPIN <=0.5 SENSITIVE Sensitive     Inducible Clindamycin POSITIVE Resistant     * >=100,000 COLONIES/mL STAPHYLOCOCCUS EPIDERMIDIS  SARS CORONAVIRUS 2 (TAT 6-24 HRS) Nasopharyngeal Nasopharyngeal Swab     Status: None   Collection Time: 09/27/19  5:13 PM   Specimen: Nasopharyngeal Swab  Result Value Ref Range Status   SARS Coronavirus 2 NEGATIVE NEGATIVE Final    Comment: (NOTE) SARS-CoV-2 target nucleic acids are NOT DETECTED. The SARS-CoV-2 RNA is generally detectable in upper and lower respiratory specimens during the acute phase of infection. Negative results do not preclude SARS-CoV-2  infection, do not rule out co-infections with other pathogens, and should not be used as the sole basis for treatment or other patient management decisions. Negative results must be combined with clinical observations, patient history, and epidemiological information. The expected result is Negative. Fact Sheet for Patients: SugarRoll.be Fact Sheet for Healthcare Providers: https://www.woods-mathews.com/ This test is not yet approved or cleared by the [place] FDA and  has been authorized for detection and/or diagnosis of SARS-CoV-2 by FDA under an Emergency Use Authorization (EUA). This EUA will remain  in effect (meaning this test can be used) for the duration of the COVID-19 declaration under Section 56 4(b)(1) of the Act, 21 U.S.C. section 360bbb-3(b)(1), unless the authorization is terminated or revoked sooner. Performed at  [facility], [place] [address], [place], [place] [address]          Radiology Studies: No results found.      Scheduled Meds: . cholecalciferol  4,000 Units Oral Daily  . dexamethasone (DECADRON) injection  4 mg Intravenous Q6H  . feeding supplement (ENSURE ENLIVE)  237 mL Oral TID BM  . heparin  5,000 Units Subcutaneous Q8H  . insulin aspart  0-6 Units Subcutaneous TID WC  . melatonin  3 mg Oral QHS  . multivitamin with minerals  1 tablet Oral Daily  . senna  2 tablet Oral QHS  . cyanocobalamin  2,000 mcg Oral Daily   Continuous Infusions:   LOS: 6 days   Time spent= 35 mins    Reynaldo Rossman Arsenio Loader, MD [facility]  If 7PM-7AM, please contact night-coverage  10/03/2019, 8:30 AM

## 2019-10-04 DIAGNOSIS — G9389 Other specified disorders of brain: Secondary | ICD-10-CM | POA: Diagnosis not present

## 2019-10-04 LAB — CBC
HCT: 43.2 % (ref 39.0–52.0)
Hemoglobin: 14.1 g/dL (ref 13.0–17.0)
MCH: 32.7 pg (ref 26.0–34.0)
MCHC: 32.6 g/dL (ref 30.0–36.0)
MCV: 100.2 fL — ABNORMAL HIGH (ref 80.0–100.0)
Platelets: 208 10*3/uL (ref 150–400)
RBC: 4.31 MIL/uL (ref 4.22–5.81)
RDW: 14.1 % (ref 11.5–15.5)
WBC: 19 10*3/uL — ABNORMAL HIGH (ref 4.0–10.5)
nRBC: 0 % (ref 0.0–0.2)

## 2019-10-04 LAB — GLUCOSE, CAPILLARY
Glucose-Capillary: 116 mg/dL — ABNORMAL HIGH (ref 70–99)
Glucose-Capillary: 140 mg/dL — ABNORMAL HIGH (ref 70–99)
Glucose-Capillary: 90 mg/dL (ref 70–99)
Glucose-Capillary: 112 mg/dL — ABNORMAL HIGH (ref 70–99)

## 2019-10-04 LAB — BASIC METABOLIC PANEL
Anion gap: 10 (ref 5–15)
BUN: 32 mg/dL — ABNORMAL HIGH (ref 8–23)
CO2: 29 mmol/L (ref 22–32)
Calcium: 8.8 mg/dL — ABNORMAL LOW (ref 8.9–10.3)
Chloride: 99 mmol/L (ref 98–111)
Creatinine, Ser: 0.85 mg/dL (ref 0.61–1.24)
GFR calc Af Amer: >60 mL/min (ref >60)
GFR calc non Af Amer: >60 mL/min (ref >60)
Glucose, Bld: 154 mg/dL — ABNORMAL HIGH (ref 70–99)
Potassium: 4.2 mmol/L (ref 3.5–5.1)
Sodium: 138 mmol/L (ref 135–145)

## 2019-10-04 LAB — MAGNESIUM: Magnesium: 2.2 mg/dL (ref 1.7–2.4)

## 2019-10-04 LAB — ACTH: C206 ACTH: <1.5 pg/mL — ABNORMAL LOW (ref 7.2–63.3)

## 2019-10-04 NOTE — Progress Notes (Signed)
Physical Therapy Treatment Patient Details Name: Casey Richard MRN: [id number] DOB: 03-05-1956 Today's Date: 10/04/2019    History of Present Illness Pt is a 64 y/o male admitted secondary to dizziness and instability. 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 Also notable for left sellar mass. Pt scheduled for surgery for mass removal on 4/19. PMH includes CKD and complete heart block s/p pacemaker.    PT Comments    Patient seen for mobility progression. Pt presents with impaired balance and requires at least single UE support for ambulation.  Pt with tangential thoughts and needs frequent redirection to tasks. Pt very pleasant and expresses appreciation to all staff. PT will continue to follow acutely and progress as tolerated. Noted surgery scheduled for 4/19.    Follow Up Recommendations  Home health PT;Supervision/Assistance - 24 hour     Equipment Recommendations  Rolling walker with 5" wheels;Other (comment)(will need further assessment post op)    Recommendations for Other Services       Precautions / Restrictions Precautions Precautions: Fall Restrictions Weight Bearing Restrictions: No    Mobility  Bed Mobility Overal bed mobility: Needs Assistance Bed Mobility: Supine to Sit;Sit to Supine     Supine to sit: Supervision Sit to supine: Supervision   General bed mobility comments: supervision for safety  Transfers Overall transfer level: Needs assistance Equipment used: None Transfers: Sit to/from Stand Sit to Stand: Min guard         General transfer comment: min guard for safety; once in standing reaching for something to hold onto for balance  Ambulation/Gait Ambulation/Gait assistance: Min guard;Min assist;Mod assist Gait Distance (Feet): 400 Feet(with rest break) Assistive device: Rolling walker (2 wheeled);1 person hand held assist(rail in hallway) Gait Pattern/deviations: Narrow base of  support;Step-through pattern;Staggering left;Drifts right/left;Leaning posteriorly Gait velocity: decreased   General Gait Details: ambulated initially without AD requiring min-mod A; pt requires at least single UE support either railing or HHA to maintain balance; min guard-min A with use of RW; pt with lateral and posterior LOB with head turns and at times taking hand off of RW    Stairs             Wheelchair Mobility    Modified Rankin (Stroke Patients Only)       Balance Overall balance assessment: Needs assistance Sitting-balance support: No upper extremity supported;Feet supported Sitting balance-Leahy Scale: Normal     Standing balance support: Single extremity supported;During functional activity Standing balance-Leahy Scale: Poor                              Cognition Arousal/Alertness: Awake/alert Behavior During Therapy: WFL for tasks assessed/performed Overall Cognitive Status: No family/caregiver present to determine baseline cognitive functioning                                 General Comments: A and Ox 4; follows commands. tangential thoughts; easily redirected but needed frequently; pt very pleasant and expressed appreciation for staff       Exercises      General Comments        Pertinent Vitals/Pain Pain Assessment: No/denies pain    Home Living                      Prior Function            PT  Goals (current goals can now be found in the care plan section) Acute Rehab PT Goals Patient Stated Goal: to go home Progress towards PT goals: Progressing toward goals    Frequency    Min 3X/week      PT Plan Current plan remains appropriate    Co-evaluation              AM-PAC PT "6 Clicks" Mobility   Outcome Measure  Help needed turning from your back to your side while in a flat bed without using bedrails?: None Help needed moving from lying on your back to sitting on the side of a flat  bed without using bedrails?: None Help needed moving to and from a bed to a chair (including a wheelchair)?: None Help needed standing up from a chair using your arms (e.g., wheelchair or bedside chair)?: None Help needed to walk in hospital room?: A Little Help needed climbing 3-5 steps with a railing? : A Lot 6 Click Score: 21    End of Session Equipment Utilized During Treatment: Gait belt Activity Tolerance: Patient tolerated treatment well Patient left: with call bell/phone within reach;in chair;with chair alarm set Nurse Communication: Mobility status PT Visit Diagnosis: Unsteadiness on feet (R26.81);Muscle weakness (generalized) (M62.81);History of falling (Z91.81)     Time: [id number] PT Time Calculation (min) (ACUTE ONLY): 30 min  Charges:  $Gait Training: 23-37 mins                     Earney Navy, PTA Acute Rehabilitation Services Pager: [phone] Office: [phone]     Darliss Cheney 10/04/2019, 1:24 PM

## 2019-10-04 NOTE — Progress Notes (Signed)
OT Cancellation Note  Patient Details Name: ABSALON MANNIS MRN: [id number] DOB: [birth year]   Cancelled Treatment:     Pt asking OT to hold session as he is trying to get POA changed currently. Pt thanking therapist for arriving but reports "its just a really important busy time"  Billey Chang, OTR/L  Acute Rehabilitation Services Pager: [phone] Office: [phone] .  10/04/2019, 1:12 PM

## 2019-10-04 NOTE — Progress Notes (Signed)
Chaplain responded to request for notary of AD.  Notary accomplished.    De Burrs Chaplain Resident

## 2019-10-04 NOTE — Progress Notes (Signed)
PROGRESS NOTE    Casey Richard  [id number] DOB: 28-Feb-1956 DOA: 09/27/2019 PCP: Benito Mccreedy, MD   Brief Narrative:  64 year old with history of complete heart block status post PPM admitted to the hospital for worsening dizziness, gait instability, weight loss.  CT showed new large cystic mass in the right cerebellopontine area with mass-effect on pons and cerebellum.  MRI confirmed 2 large brain masses.  Neurosurgery team consulted.  IV Decadron started. Plans for surgery on 4/19 per neurosurgery.   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  No need for seizure prophylaxis.  Leukocytosis secondary to steroid use  History of bradycardia/V. fib arrest in 2013 Status post pacemaker -Follows outpatient cardiology Dr Sallyanne Kuster.  History of urinary retention -Follows with Dr. Alinda Money from urology outpatient. -Urine cultures growing Staph epidermidis.  Currently he is asymptomatic  Severe weight loss -Secondary to underlying malignancy.  Encourage p.o. intake. -Consult dietitian  Chaplain service helping out with AD paperwork DVT prophylaxis: Subcutaneous heparin Code Status: Full code Family Communication:   Disposition Plan:   Patient From= home  Patient Anticipated D/C place= to be determined  Barriers= maintain hospital stay until his surgery on 4/19. Postop plan to be determined according to his surgery.  Subjective: No complaints feels ok Asking detailed surgical questions which I will defer to neurosurgery as his outside my scope of practice.  He understands this   Review of Systems Otherwise negative except as per HPI, including: General: Denies fever, chills, night sweats or unintended weight loss. Resp: Denies cough, wheezing, shortness of breath. Cardiac: Denies chest pain, palpitations, orthopnea, paroxysmal nocturnal dyspnea. GI: Denies abdominal pain, nausea, vomiting, diarrhea or constipation GU: Denies dysuria, frequency, hesitancy or incontinence MS: Denies muscle aches, joint pain or swelling Neuro: Denies headache, neurologic deficits (focal weakness, numbness, tingling), abnormal gait Psych: Denies anxiety, depression, SI/HI/AVH Skin: Denies new rashes or lesions ID: Denies sick contacts, exotic exposures, travel  Examination: Constitutional: Not in acute distress Respiratory: Clear to auscultation bilaterally Cardiovascular: Normal sinus rhythm, no rubs Abdomen: Nontender nondistended good bowel sounds Musculoskeletal: No edema noted Skin: No rashes seen Neurologic: CN 2-12 grossly intact.  And nonfocal Psychiatric: Normal judgment and insight. Alert and oriented x 3. Normal mood.  Objective: Vitals:   10/03/19 1934 10/04/19 0050 10/04/19 0452 10/04/19 0800  BP: 132/83 129/81    Pulse: 76 70 70   Resp: 17     Temp: 98.1 F (36.7 C) 98.2 F (36.8 C) 98.2 F (36.8 C)   TempSrc: Oral Oral Oral   SpO2: 98% 96% 97% 97%    Intake/Output Summary (Last 24 hours) at 10/04/2019 0833 Last data filed at 10/04/2019 0414 Gross per 24 hour  Intake 600 ml  Output 1775 ml  Net -1175 ml   There were no vitals filed for this visit.   Data Reviewed:   CBC: Recent Labs  Lab 09/27/19 1337 09/27/19 1337 09/27/19 1943 09/28/19 0449 10/02/19 0406 10/03/19 0412 10/04/19 0506  WBC 7.0   < > 7.9 7.9 15.9* 15.9* 19.0*  NEUTROABS 4.9  --   --   --   --   --   --  HGB 12.8*   < > 13.5 13.4 14.3 14.1 14.1  HCT 38.6*   < > 40.9 41.0 43.0 42.9 43.2  MCV 101.6*   < > 98.1 98.6 97.3 97.3 100.2*  PLT 198   < > 224 252 243 226 208   < >  = values in this interval not displayed.   Basic Metabolic Panel: Recent Labs  Lab 09/27/19 1337 09/27/19 1337 09/27/19 1943 09/28/19 0449 10/02/19 0406 10/03/19 0412 10/04/19 0506  NA 140  --   --  139 137 137 138  K 4.3  --   --  4.0 4.1 4.8 4.2  CL 101  --   --  100 97* 99 99  CO2 30  --   --  29 29 30 29   GLUCOSE 91  --   --  134* 111* 142* 154*  BUN 14  --   --  11 20 32* 32*  CREATININE 0.62   < > 0.69 0.71 0.71 0.93 0.85  CALCIUM 8.5*  --   --  9.1 8.7* 8.9 8.8*  MG  --   --   --   --  2.1 2.3 2.2   < > = values in this interval not displayed.   GFR: CrCl cannot be calculated (Unknown ideal weight.). Liver Function Tests: Recent Labs  Lab 09/27/19 1337  AST 18  ALT 25  ALKPHOS 52  BILITOT 0.9  PROT 6.6  ALBUMIN 3.7   No results for input(s): LIPASE, AMYLASE in the last 168 hours. No results for input(s): AMMONIA in the last 168 hours. Coagulation Profile: No results for input(s): INR, PROTIME in the last 168 hours. Cardiac Enzymes: No results for input(s): CKTOTAL, CKMB, CKMBINDEX, TROPONINI in the last 168 hours. BNP (last 3 results) No results for input(s): PROBNP in the last 8760 hours. HbA1C: No results for input(s): HGBA1C in the last 72 hours. CBG: Recent Labs  Lab 10/02/19 2118 10/03/19 0643 10/03/19 1206 10/03/19 1623 10/04/19 0616  GLUCAP 164* 126* 123* 98 116*   Lipid Profile: No results for input(s): CHOL, HDL, LDLCALC, TRIG, CHOLHDL, LDLDIRECT in the last 72 hours. Thyroid Function Tests: Recent Labs    10/01/19 1932  FREET4 0.67   Anemia Panel: No results for input(s): VITAMINB12, FOLATE, FERRITIN, TIBC, IRON, RETICCTPCT in the last 72 hours. Sepsis Labs: No results for input(s): PROCALCITON, LATICACIDVEN in the last 168 hours.  Recent Results (from the past 240 hour(s))  Urine culture     Status: Abnormal   Collection Time: 09/27/19  1:37 PM   Specimen: Urine, Random  Result Value Ref Range Status   Specimen Description    Final    URINE, RANDOM Performed at [facility] [address], [place], [place] [address]    Special Requests   Final    NONE Performed at [facility], [place] [address] [place], [place] [address]    Culture >=100,000 COLONIES/mL STAPHYLOCOCCUS EPIDERMIDIS (A)  Final   Report Status 09/29/2019 FINAL  Final   Organism ID, Bacteria STAPHYLOCOCCUS EPIDERMIDIS (A)  Final      Susceptibility   Staphylococcus epidermidis - MIC*    CIPROFLOXACIN <=0.5 SENSITIVE Sensitive     GENTAMICIN <=0.5 SENSITIVE Sensitive     NITROFURANTOIN <=16 SENSITIVE Sensitive     OXACILLIN >=4 RESISTANT Resistant     TETRACYCLINE >=16 RESISTANT Resistant     VANCOMYCIN 2 SENSITIVE Sensitive     TRIMETH/SULFA <=10 SENSITIVE Sensitive     CLINDAMYCIN  RESISTANT Resistant     RIFAMPIN <=0.5 SENSITIVE Sensitive     Inducible Clindamycin POSITIVE Resistant     * >=100,000 COLONIES/mL STAPHYLOCOCCUS EPIDERMIDIS  SARS CORONAVIRUS 2 (TAT 6-24 HRS) Nasopharyngeal Nasopharyngeal Swab     Status: None   Collection Time: 09/27/19  5:13 PM   Specimen: Nasopharyngeal Swab  Result Value Ref Range Status   SARS Coronavirus 2 NEGATIVE NEGATIVE Final    Comment: (NOTE) SARS-CoV-2 target nucleic acids are NOT DETECTED. The SARS-CoV-2 RNA is generally detectable in upper and lower respiratory specimens during the acute phase of infection. Negative results do not preclude SARS-CoV-2 infection, do not rule out co-infections with other pathogens, and should not be used as the sole basis for treatment or other patient management decisions. Negative results must be combined with clinical observations, patient history, and epidemiological information. The expected result is Negative. Fact Sheet for Patients: SugarRoll.be Fact Sheet for Healthcare Providers: https://www.woods-mathews.com/ This test is not yet approved or cleared by the [place]  FDA and  has been authorized for detection and/or diagnosis of SARS-CoV-2 by FDA under an Emergency Use Authorization (EUA). This EUA will remain  in effect (meaning this test can be used) for the duration of the COVID-19 declaration under Section 56 4(b)(1) of the Act, 21 U.S.C. section 360bbb-3(b)(1), unless the authorization is terminated or revoked sooner. Performed at [facility], [place] [address], [place], [place] [address]          Radiology Studies: No results found.      Scheduled Meds: . cholecalciferol  4,000 Units Oral Daily  . dexamethasone (DECADRON) injection  4 mg Intravenous Q6H  . feeding supplement (ENSURE ENLIVE)  237 mL Oral TID BM  . heparin  5,000 Units Subcutaneous Q8H  . insulin aspart  0-6 Units Subcutaneous TID WC  . melatonin  3 mg Oral QHS  . multivitamin with minerals  1 tablet Oral Daily  . senna  2 tablet Oral QHS  . cyanocobalamin  2,000 mcg Oral Daily   Continuous Infusions:   LOS: 7 days   Time spent= 35 mins    Samvel Zinn Arsenio Loader, MD [facility]  If 7PM-7AM, please contact night-coverage  10/04/2019, 8:33 AM

## 2019-10-05 DIAGNOSIS — G9389 Other specified disorders of brain: Secondary | ICD-10-CM | POA: Diagnosis not present

## 2019-10-05 LAB — CBC
HCT: 40.3 % (ref 39.0–52.0)
Hemoglobin: 13.2 g/dL (ref 13.0–17.0)
MCH: 32.4 pg (ref 26.0–34.0)
MCHC: 32.8 g/dL (ref 30.0–36.0)
MCV: 98.8 fL (ref 80.0–100.0)
Platelets: 189 10*3/uL (ref 150–400)
RBC: 4.08 MIL/uL — ABNORMAL LOW (ref 4.22–5.81)
RDW: 14 % (ref 11.5–15.5)
WBC: 19.9 10*3/uL — ABNORMAL HIGH (ref 4.0–10.5)
nRBC: 0 % (ref 0.0–0.2)

## 2019-10-05 LAB — GLUCOSE, CAPILLARY
Glucose-Capillary: 119 mg/dL — ABNORMAL HIGH (ref 70–99)
Glucose-Capillary: 149 mg/dL — ABNORMAL HIGH (ref 70–99)
Glucose-Capillary: 90 mg/dL (ref 70–99)
Glucose-Capillary: 113 mg/dL — ABNORMAL HIGH (ref 70–99)

## 2019-10-05 LAB — BASIC METABOLIC PANEL
Anion gap: 5 (ref 5–15)
BUN: 33 mg/dL — ABNORMAL HIGH (ref 8–23)
CO2: 31 mmol/L (ref 22–32)
Calcium: 8.5 mg/dL — ABNORMAL LOW (ref 8.9–10.3)
Chloride: 100 mmol/L (ref 98–111)
Creatinine, Ser: 0.85 mg/dL (ref 0.61–1.24)
GFR calc Af Amer: >60 mL/min (ref >60)
GFR calc non Af Amer: >60 mL/min (ref >60)
Glucose, Bld: 139 mg/dL — ABNORMAL HIGH (ref 70–99)
Potassium: 4.6 mmol/L (ref 3.5–5.1)
Sodium: 136 mmol/L (ref 135–145)

## 2019-10-05 LAB — MAGNESIUM: Magnesium: 2.2 mg/dL (ref 1.7–2.4)

## 2019-10-05 NOTE — Progress Notes (Signed)
Physical Therapy Treatment Patient Details Name: Casey Richard MRN: [id number] DOB: 08/21/1955 Today's Date: 10/05/2019    History of Present Illness Pt is a 64 y/o male admitted secondary to dizziness and instability. 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 Also notable for left sellar mass. Pt scheduled for surgery for mass removal on 4/19. PMH includes CKD and complete heart block s/p pacemaker.    PT Comments    Pt in bed upon arrival of PT, agreeable to session with focus on progressing ambulation tolerance and general LE strengthening. The pt was able to demo good stability with ambulation in hall with single UE support. The pt was then educated in multiple LE strengthening exercises both in standing positions and seated in chair. The pt was very appreciative of the exercises as he voiced a lot of surprise over onset of significant weakness. The pt will continue to benefit from skilled PT to further address dynamic stability and endurance prior to d/c.     Follow Up Recommendations  Home health PT;Supervision/Assistance - 24 hour     Equipment Recommendations  Rolling walker with 5" wheels;Other (comment)(need further assessment post op)    Recommendations for Other Services       Precautions / Restrictions Precautions Precautions: Fall Restrictions Weight Bearing Restrictions: No    Mobility  Bed Mobility Overal bed mobility: Needs Assistance Bed Mobility: Supine to Sit;Sit to Supine     Supine to sit: Supervision Sit to supine: Supervision   General bed mobility comments: supervision for safety  Transfers Overall transfer level: Needs assistance Equipment used: None Transfers: Sit to/from Stand Sit to Stand: Min guard         General transfer comment: min guard for safety; once in standing reaching for something to hold onto for balance  Ambulation/Gait Ambulation/Gait assistance: Min assist;Min guard Gait  Distance (Feet): 400 Feet Assistive device: 1 person hand held assist(some use of rail in hall, minA HHA when not using rail) Gait Pattern/deviations: Narrow base of support;Step-through pattern;Staggering left;Drifts right/left Gait velocity: decreased Gait velocity interpretation: 1.31 - 2.62 ft/sec, indicative of limited community ambulator General Gait Details: pt initially ambulating with minG assist and use of rail in hallway, given HHA and ased not to use rail, pt able to maintain ambulation with minA HHA with no LOB. Small gait deviations with head turns, slowed but steady with weaving through hallway, no LOB throughout   Stairs             Wheelchair Mobility    Modified Rankin (Stroke Patients Only)       Balance Overall balance assessment: Needs assistance Sitting-balance support: No upper extremity supported;Feet supported Sitting balance-Leahy Scale: Normal     Standing balance support: Single extremity supported;During functional activity Standing balance-Leahy Scale: Poor Standing balance comment: Required UE support - with UE support no LOB                            Cognition Arousal/Alertness: Awake/alert Behavior During Therapy: WFL for tasks assessed/performed Overall Cognitive Status: No family/caregiver present to determine baseline cognitive functioning                                 General Comments: A and Ox 4; follows commands. tangential thoughts; easily redirected but needed frequently; pt very pleasant and expressed appreciation for staff  Exercises General Exercises - Lower Extremity Long Arc Quad: AROM;10 reps;Seated;Strengthening Heel Raises: Strengthening;10 reps;Both;Standing Mini-Sqauts: Strengthening;Both;10 reps;Standing    General Comments        Pertinent Vitals/Pain Pain Assessment: No/denies pain Pain Intervention(s): Limited activity within patient's tolerance;Monitored during session     Home Living                      Prior Function            PT Goals (current goals can now be found in the care plan section) Acute Rehab PT Goals Patient Stated Goal: to go home PT Goal Formulation: With patient Time For Goal Achievement: 10/15/19 Potential to Achieve Goals: Good Progress towards PT goals: Progressing toward goals    Frequency    Min 3X/week      PT Plan Current plan remains appropriate    Co-evaluation              AM-PAC PT "6 Clicks" Mobility   Outcome Measure  Help needed turning from your back to your side while in a flat bed without using bedrails?: None Help needed moving from lying on your back to sitting on the side of a flat bed without using bedrails?: None Help needed moving to and from a bed to a chair (including a wheelchair)?: None Help needed standing up from a chair using your arms (e.g., wheelchair or bedside chair)?: None Help needed to walk in hospital room?: A Little Help needed climbing 3-5 steps with a railing? : A Lot 6 Click Score: 21    End of Session Equipment Utilized During Treatment: Gait belt Activity Tolerance: Patient tolerated treatment well Patient left: with call bell/phone within reach;in chair;with chair alarm set Nurse Communication: Mobility status PT Visit Diagnosis: Unsteadiness on feet (R26.81);Muscle weakness (generalized) (M62.81);History of falling (Z91.81)     Time: [id number] PT Time Calculation (min) (ACUTE ONLY): 28 min  Charges:  $Gait Training: 8-22 mins $Therapeutic Exercise: 8-22 mins                     Karma Ganja, PT, DPT   Acute Rehabilitation Department Pager #: [phone]   Otho Bellows 10/05/2019, 5:20 PM

## 2019-10-05 NOTE — Progress Notes (Signed)
PROGRESS NOTE    Casey Richard  [id number] DOB: [birth year] DOA: 09/27/2019 PCP: Benito Mccreedy, MD   Brief Narrative:  64 year old with history of complete heart block status post PPM admitted to the hospital for worsening dizziness, gait instability, weight loss.  CT showed new large cystic mass in the right cerebellopontine area with mass-effect on pons and cerebellum.  MRI confirmed 2 large brain masses.  Neurosurgery team consulted.  IV Decadron started. Plans for surgery on 4/19 per neurosurgery.   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  Leukocytosis secondary to steroid use  History of bradycardia/V. fib arrest in 2013 Status post pacemaker -Follows outpatient cardiology Dr Sallyanne Kuster.  History of urinary retention -Follows with Dr. Alinda Money from urology outpatient. -Urine cultures growing Staph epidermidis.  Currently he is asymptomatic  Severe weight loss -Secondary to underlying malignancy.  Encourage p.o. intake. -Consult dietitian  Chaplain assisting with AD paperwork  DVT prophylaxis: Subcutaneous heparin Code Status: Full code Family Communication:   Disposition Plan:   Patient From= home  Patient Anticipated D/C place= to be determined  Barriers= maintain hospital stay until his surgery on 4/19. Postop plan to be determined according to his surgery.  Subjective: Feels okay no complaints besides mild visual disturbance  Review of Systems Otherwise negative except as per HPI, including: General: Denies fever, chills, night  sweats or unintended weight loss. Resp: Denies cough, wheezing, shortness of breath. Cardiac: Denies chest pain, palpitations, orthopnea, paroxysmal nocturnal dyspnea. GI: Denies abdominal pain, nausea, vomiting, diarrhea or constipation GU: Denies dysuria, frequency, hesitancy or incontinence MS: Denies muscle aches, joint pain or swelling Neuro: Denies headache, neurologic deficits (focal weakness, numbness, tingling), abnormal gait Psych: Denies anxiety, depression, SI/HI/AVH Skin: Denies new rashes or lesions ID: Denies sick contacts, exotic exposures, travel  Examination: Constitutional: Not in acute distress Respiratory: Clear to auscultation bilaterally Cardiovascular: Normal sinus rhythm, no rubs Abdomen: Nontender nondistended good bowel sounds Musculoskeletal: No edema noted Skin: No rashes seen Neurologic: CN 2-12 grossly intact.  And nonfocal Psychiatric: Normal judgment and insight. Alert and oriented x 3. Normal mood.  Objective: Vitals:   10/04/19 1614 10/04/19 1931 10/04/19 2355 10/05/19 0335  BP: 134/72 (!) 142/90 132/85 140/81  Pulse: 78     Resp: 18 17 (!) 23 13  Temp: 97.7 F (36.5 C) 97.8 F (36.6 C) 97.9 F (36.6 C) 97.9 F (36.6 C)  TempSrc: Oral Oral Oral Oral  SpO2: 95% 94% 96% 96%    Intake/Output Summary (Last 24 hours) at 10/05/2019 0742 Last data filed at 10/05/2019 [id number] Gross per 24 hour  Intake 240 ml  Output 1450 ml  Net -1210 ml   There were no vitals filed for this visit.   Data Reviewed:   CBC: Recent Labs  Lab 10/02/19 0406 10/03/19 0412 10/04/19 0506 10/05/19 0306  WBC 15.9* 15.9* 19.0* 19.9*  HGB 14.3 14.1 14.1 13.2  HCT 43.0 42.9 43.2 40.3  MCV 97.3 97.3 100.2* 98.8  PLT 243 226 208 [id number]   Basic Metabolic Panel: Recent Labs  Lab 10/02/19 0406 10/03/19 0412 10/04/19 0506 10/05/19 0306  NA 137 137 138 136  K 4.1 4.8 4.2 4.6  CL 97* 99 99 100  CO2 29 30 29 31   GLUCOSE 111* 142* 154* 139*  BUN 20 32* 32* 33*    CREATININE 0.71 0.93 0.85 0.85  CALCIUM 8.7* 8.9 8.8* 8.5*  MG 2.1 2.3 2.2 2.2   GFR: CrCl cannot be calculated (Unknown ideal weight.). Liver Function Tests: No results for input(s): AST, ALT, ALKPHOS, BILITOT, PROT, ALBUMIN in the last 168 hours. No results for input(s): LIPASE, AMYLASE in the last 168 hours. No results for input(s): AMMONIA in the last 168 hours. Coagulation Profile: No results for input(s): INR, PROTIME in the last 168 hours. Cardiac Enzymes: No results for input(s): CKTOTAL, CKMB, CKMBINDEX, TROPONINI in the last 168 hours. BNP (last 3 results) No results for input(s): PROBNP in the last 8760 hours. HbA1C: No results for input(s): HGBA1C in the last 72 hours. CBG: Recent Labs  Lab 10/04/19 0616 10/04/19 1130 10/04/19 1617 10/04/19 2136 10/05/19 0621  GLUCAP 116* 140* 90 112* 119*   Lipid Profile: No results for input(s): CHOL, HDL, LDLCALC, TRIG, CHOLHDL, LDLDIRECT in the last 72 hours. Thyroid Function Tests: No results for input(s): TSH, T4TOTAL, FREET4, T3FREE, THYROIDAB in the last 72 hours. Anemia Panel: No results for input(s): VITAMINB12, FOLATE, FERRITIN, TIBC, IRON, RETICCTPCT in the last 72 hours. Sepsis Labs: No results for input(s): PROCALCITON, LATICACIDVEN in the last 168 hours.  Recent Results (from the past 240 hour(s))  Urine culture     Status: Abnormal   Collection Time: 09/27/19  1:37 PM   Specimen: Urine, Random  Result Value Ref Range Status   Specimen Description   Final    URINE, RANDOM Performed at [facility] [address], [place], [place] [address]    Special Requests   Final    NONE Performed at [facility], [place] [address] [place], [place] [address]    Culture >=100,000 COLONIES/mL STAPHYLOCOCCUS EPIDERMIDIS (A)  Final   Report Status 09/29/2019 FINAL  Final   Organism ID, Bacteria STAPHYLOCOCCUS EPIDERMIDIS (A)  Final      Susceptibility   Staphylococcus epidermidis -  MIC*    CIPROFLOXACIN <=0.5 SENSITIVE Sensitive     GENTAMICIN <=0.5 SENSITIVE Sensitive     NITROFURANTOIN <=16 SENSITIVE Sensitive     OXACILLIN >=4 RESISTANT Resistant     TETRACYCLINE >=16 RESISTANT Resistant     VANCOMYCIN 2 SENSITIVE Sensitive     TRIMETH/SULFA <=10 SENSITIVE Sensitive     CLINDAMYCIN RESISTANT Resistant     RIFAMPIN <=0.5 SENSITIVE Sensitive     Inducible Clindamycin POSITIVE Resistant     * >=100,000 COLONIES/mL STAPHYLOCOCCUS EPIDERMIDIS  SARS CORONAVIRUS 2 (TAT 6-24 HRS) Nasopharyngeal Nasopharyngeal Swab     Status: None   Collection Time: 09/27/19  5:13 PM   Specimen: Nasopharyngeal Swab  Result Value Ref Range Status   SARS Coronavirus 2 NEGATIVE NEGATIVE Final    Comment: (NOTE) SARS-CoV-2 target nucleic acids are NOT DETECTED. The SARS-CoV-2 RNA is generally detectable in upper and lower respiratory specimens during the acute phase of infection. Negative results do not preclude SARS-CoV-2 infection, do not rule out co-infections with other pathogens, and should not be used as the sole basis for treatment or other patient management decisions. Negative results must be combined with clinical observations, patient history, and epidemiological information. The expected result is Negative. Fact Sheet for Patients: SugarRoll.be Fact Sheet for Healthcare Providers: https://www.woods-mathews.com/ This test is not yet approved or cleared by the [place] FDA  and  has been authorized for detection and/or diagnosis of SARS-CoV-2 by FDA under an Emergency Use Authorization (EUA). This EUA will remain  in effect (meaning this test can be used) for the duration of the COVID-19 declaration under Section 56 4(b)(1) of the Act, 21 U.S.C. section 360bbb-3(b)(1), unless the authorization is terminated or revoked sooner. Performed at [facility], [place] [address], [place], [place] [address]          Radiology  Studies: No results found.      Scheduled Meds: . cholecalciferol  4,000 Units Oral Daily  . dexamethasone (DECADRON) injection  4 mg Intravenous Q6H  . feeding supplement (ENSURE ENLIVE)  237 mL Oral TID BM  . heparin  5,000 Units Subcutaneous Q8H  . insulin aspart  0-6 Units Subcutaneous TID WC  . melatonin  3 mg Oral QHS  . multivitamin with minerals  1 tablet Oral Daily  . senna  2 tablet Oral QHS  . cyanocobalamin  2,000 mcg Oral Daily   Continuous Infusions:   LOS: 8 days   Time spent= 35 mins    Trannie Bardales Arsenio Loader, MD [facility]  If 7PM-7AM, please contact night-coverage  10/05/2019, 7:42 AM

## 2019-10-06 DIAGNOSIS — G9389 Other specified disorders of brain: Secondary | ICD-10-CM | POA: Diagnosis not present

## 2019-10-06 LAB — GLUCOSE, CAPILLARY
Glucose-Capillary: 150 mg/dL — ABNORMAL HIGH (ref 70–99)
Glucose-Capillary: 130 mg/dL — ABNORMAL HIGH (ref 70–99)
Glucose-Capillary: 109 mg/dL — ABNORMAL HIGH (ref 70–99)
Glucose-Capillary: 161 mg/dL — ABNORMAL HIGH (ref 70–99)

## 2019-10-06 LAB — MRSA PCR SCREENING: MRSA by PCR: NEGATIVE

## 2019-10-06 NOTE — Progress Notes (Signed)
  NEUROSURGERY PROGRESS NOTE   Patient scheduled for craniotomy for tumor excision tomorrow am with Dr Zada Finders.  - npo at midnight - d/c all blood thinning medications  Dr Zada Finders to see patient tomorrow am.

## 2019-10-06 NOTE — Progress Notes (Signed)
Chaplain responded to consult from nurse.  Patient was sitting up in bed.  His best friend, who had also had had a brain mass, was present with him, as was the patient's son.  The patient is having brain surgery on Monday for a mass.   The friend wanted to know if it was possible for the department to get the patient's legal affairs in order before his surgery.  The chaplain said Advanced Directives were offered by our department but that we did not do legal work beyond this medical scope.  The chaplain noted that [facility] had been contact with the patient last week.  She said that she would be happy to inform the Dept's notaries that paperwork other than an AD might be presented for notarization, but that that was done under special circumstances, and that the notary may wish to review the paperwork before the notary signs it.  The friend indicated me may get something ready for a signing by Monday, and the chaplain said she would inform the notaries of the patient's desire to get his affairs in order before his surgery.  Rev. Tamsen Snider Pager [phone]

## 2019-10-06 NOTE — Progress Notes (Addendum)
PROGRESS NOTE    Casey Richard  [id number] DOB: 10/10/1955 DOA: 09/27/2019 PCP: Benito Mccreedy, MD   Brief Narrative:  64 year old with history of complete heart block status post PPM admitted to the hospital for worsening dizziness, gait instability, weight loss.  CT showed new large cystic mass in the right cerebellopontine area with mass-effect on pons and cerebellum.  MRI confirmed 2 large brain masses.  Neurosurgery team consulted.  IV Decadron started. Plans for surgery on 4/19 per neurosurgery.   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  Leukocytosis secondary to steroid use  History of bradycardia/V. fib arrest in 2013 Status post pacemaker -Follows outpatient cardiology Dr Sallyanne Kuster.  History of urinary retention -Follows with Dr. Alinda Money from urology outpatient. -Urine cultures growing Staph epidermidis.  Currently he is asymptomatic  Severe weight loss -Secondary to underlying malignancy.  Encourage p.o. intake. -Consult dietitian  Chaplain assisting with AD paperwork  DVT prophylaxis: Subcutaneous heparin stopped in anticipation for surgery tomorrow Code Status: Full code Family Communication:   Disposition Plan:   Patient From= home  Patient Anticipated D/C place= to be determined  Barriers= maintain hospital stay until his surgery on 4/19. Postop plan to be determined according to his surgery.  Subjective: No complaints, no acute events overnight  Review of Systems Otherwise negative except as per HPI,  including: General: Denies fever, chills, night sweats or unintended weight loss. Resp: Denies cough, wheezing, shortness of breath. Cardiac: Denies chest pain, palpitations, orthopnea, paroxysmal nocturnal dyspnea. GI: Denies abdominal pain, nausea, vomiting, diarrhea or constipation GU: Denies dysuria, frequency, hesitancy or incontinence MS: Denies muscle aches, joint pain or swelling Neuro: Denies headache, neurologic deficits (focal weakness, numbness, tingling), abnormal gait Psych: Denies anxiety, depression, SI/HI/AVH Skin: Denies new rashes or lesions ID: Denies sick contacts, exotic exposures, travel  Examination: Constitutional: Not in acute distress Respiratory: Clear to auscultation bilaterally Cardiovascular: Normal sinus rhythm, no rubs Abdomen: Nontender nondistended good bowel sounds Musculoskeletal: No edema noted Skin: No rashes seen Neurologic: CN 2-12 grossly intact.  And nonfocal Psychiatric: Normal judgment and insight. Alert and oriented x 3. Normal mood.  Objective: Vitals:   10/05/19 2059 10/05/19 2338 10/06/19 0338 10/06/19 0738  BP: 124/74 139/88 (!) 152/88 (!) 146/90  Pulse: 75     Resp: 16 17 18 14   Temp: 97.6 F (36.4 C) 97.7 F (36.5 C) 98 F (36.7 C) 97.9 F (36.6 C)  TempSrc: Oral Oral Axillary Axillary  SpO2: 95% 96% 95% 97%    Intake/Output Summary (Last 24 hours) at 10/06/2019 0926 Last data filed at 10/06/2019 0522 Gross per 24 hour  Intake 180 ml  Output 1300 ml  Net -1120 ml   There were no vitals filed for this visit.   Data Reviewed:   CBC: Recent Labs  Lab 10/02/19 0406 10/03/19 0412 10/04/19 0506 10/05/19 0306  WBC 15.9* 15.9* 19.0* 19.9*  HGB 14.3 14.1 14.1 13.2  HCT 43.0 42.9 43.2 40.3  MCV 97.3 97.3 100.2* 98.8  PLT 243 226 208 [id number]   Basic Metabolic Panel: Recent Labs  Lab 10/02/19 0406 10/03/19 0412 10/04/19 0506 10/05/19 0306  NA 137  137 138 136  K 4.1 4.8 4.2 4.6  CL 97* 99 99 100  CO2 29 30 29 31    GLUCOSE 111* 142* 154* 139*  BUN 20 32* 32* 33*  CREATININE 0.71 0.93 0.85 0.85  CALCIUM 8.7* 8.9 8.8* 8.5*  MG 2.1 2.3 2.2 2.2   GFR: CrCl cannot be calculated (Unknown ideal weight.). Liver Function Tests: No results for input(s): AST, ALT, ALKPHOS, BILITOT, PROT, ALBUMIN in the last 168 hours. No results for input(s): LIPASE, AMYLASE in the last 168 hours. No results for input(s): AMMONIA in the last 168 hours. Coagulation Profile: No results for input(s): INR, PROTIME in the last 168 hours. Cardiac Enzymes: No results for input(s): CKTOTAL, CKMB, CKMBINDEX, TROPONINI in the last 168 hours. BNP (last 3 results) No results for input(s): PROBNP in the last 8760 hours. HbA1C: No results for input(s): HGBA1C in the last 72 hours. CBG: Recent Labs  Lab 10/05/19 0621 10/05/19 1129 10/05/19 1706 10/05/19 2107 10/06/19 0615  GLUCAP 119* 149* 90 113* 150*   Lipid Profile: No results for input(s): CHOL, HDL, LDLCALC, TRIG, CHOLHDL, LDLDIRECT in the last 72 hours. Thyroid Function Tests: No results for input(s): TSH, T4TOTAL, FREET4, T3FREE, THYROIDAB in the last 72 hours. Anemia Panel: No results for input(s): VITAMINB12, FOLATE, FERRITIN, TIBC, IRON, RETICCTPCT in the last 72 hours. Sepsis Labs: No results for input(s): PROCALCITON, LATICACIDVEN in the last 168 hours.  Recent Results (from the past 240 hour(s))  Urine culture     Status: Abnormal   Collection Time: 09/27/19  1:37 PM   Specimen: Urine, Random  Result Value Ref Range Status   Specimen Description   Final    URINE, RANDOM Performed at [facility] [address] [place], [place] [address]    Special Requests   Final    NONE Performed at [facility], [place] [address] [place],  [address]    Culture >=100,000 COLONIES/mL STAPHYLOCOCCUS EPIDERMIDIS (A)  Final   Report Status 09/29/2019 FINAL  Final   Organism ID, Bacteria STAPHYLOCOCCUS EPIDERMIDIS (A)  Final       Susceptibility   Staphylococcus epidermidis - MIC*    CIPROFLOXACIN <=0.5 SENSITIVE Sensitive     GENTAMICIN <=0.5 SENSITIVE Sensitive     NITROFURANTOIN <=16 SENSITIVE Sensitive     OXACILLIN >=4 RESISTANT Resistant     TETRACYCLINE >=16 RESISTANT Resistant     VANCOMYCIN 2 SENSITIVE Sensitive     TRIMETH/SULFA <=10 SENSITIVE Sensitive     CLINDAMYCIN RESISTANT Resistant     RIFAMPIN <=0.5 SENSITIVE Sensitive     Inducible Clindamycin POSITIVE Resistant     * >=100,000 COLONIES/mL STAPHYLOCOCCUS EPIDERMIDIS  SARS CORONAVIRUS 2 (TAT 6-24 HRS) Nasopharyngeal Nasopharyngeal Swab     Status: None   Collection Time: 09/27/19  5:13 PM   Specimen: Nasopharyngeal Swab  Result Value Ref Range Status   SARS Coronavirus 2 NEGATIVE NEGATIVE Final    Comment: (NOTE) SARS-CoV-2 target nucleic acids are NOT DETECTED. The SARS-CoV-2 RNA is generally detectable in upper and lower respiratory specimens during the acute phase of infection. Negative results do not preclude SARS-CoV-2 infection, do not rule out co-infections with other pathogens, and should not be used as the sole basis for treatment or other patient management decisions. Negative results must be combined with clinical observations, patient history, and epidemiological information. The expected result is Negative. Fact Sheet for Patients: SugarRoll.be Fact Sheet for Healthcare Providers: https://www.woods-mathews.com/ This test is not yet approved or cleared by the  [place] Architectural technologist and  has been authorized for detection and/or diagnosis of SARS-CoV-2 by FDA under an Print production planner (EUA). This EUA will remain  in effect (meaning this test can be used) for the duration of the COVID-19 declaration under Section 56 4(b)(1) of the Act, 21 U.S.C. section 360bbb-3(b)(1), unless the authorization is terminated or revoked sooner. Performed at [facility], [place] [address] [place], [place] [address]          Radiology Studies: No results found.      Scheduled Meds: . cholecalciferol  4,000 Units Oral Daily  . dexamethasone (DECADRON) injection  4 mg Intravenous Q6H  . feeding supplement (ENSURE ENLIVE)  237 mL Oral TID BM  . insulin aspart  0-6 Units Subcutaneous TID WC  . melatonin  3 mg Oral QHS  . multivitamin with minerals  1 tablet Oral Daily  . senna  2 tablet Oral QHS  . cyanocobalamin  2,000 mcg Oral Daily   Continuous Infusions:   LOS: 9 days   Time spent= 35 mins    Shae Augello Arsenio Loader, MD [facility]  If 7PM-7AM, please contact night-coverage  10/06/2019, 9:26 AM

## 2019-10-07 ENCOUNTER — Other Ambulatory Visit: Payer: Self-pay

## 2019-10-07 ENCOUNTER — Encounter (HOSPITAL_COMMUNITY): Payer: Self-pay | Admitting: Internal Medicine

## 2019-10-07 ENCOUNTER — Encounter (HOSPITAL_COMMUNITY)
Admission: EM | Disposition: A | Discharge status: Skilled Nursing Facility | Payer: Self-pay | Source: Home / Self Care | Attending: Neurological Surgery

## 2019-10-07 ENCOUNTER — Inpatient Hospital Stay (HOSPITAL_COMMUNITY): Payer: Medicare Other | Admitting: Certified Registered Nurse Anesthetist

## 2019-10-07 ENCOUNTER — Other Ambulatory Visit: Payer: Self-pay | Admitting: Radiation Therapy

## 2019-10-07 DIAGNOSIS — D496 Neoplasm of unspecified behavior of brain: Secondary | ICD-10-CM | POA: Diagnosis present

## 2019-10-07 HISTORY — PX: CRANIOTOMY: SHX93

## 2019-10-07 LAB — CBC
HCT: 41.1 % (ref 39.0–52.0)
Hemoglobin: 13.8 g/dL (ref 13.0–17.0)
MCH: 33.3 pg (ref 26.0–34.0)
MCHC: 33.6 g/dL (ref 30.0–36.0)
MCV: 99 fL (ref 80.0–100.0)
Platelets: 184 10*3/uL (ref 150–400)
RBC: 4.15 MIL/uL — ABNORMAL LOW (ref 4.22–5.81)
RDW: 14.3 % (ref 11.5–15.5)
WBC: 21.2 10*3/uL — ABNORMAL HIGH (ref 4.0–10.5)
nRBC: 0 % (ref 0.0–0.2)

## 2019-10-07 LAB — POCT I-STAT 7, (LYTES, BLD GAS, ICA,H+H)
Acid-Base Excess: 3 mmol/L — ABNORMAL HIGH (ref 0.0–2.0)
Bicarbonate: 28.1 mmol/L — ABNORMAL HIGH (ref 20.0–28.0)
Calcium, Ion: 1.13 mmol/L — ABNORMAL LOW (ref 1.15–1.40)
HCT: 41 % (ref 39.0–52.0)
Hemoglobin: 13.9 g/dL (ref 13.0–17.0)
O2 Saturation: 100 %
Patient temperature: 35.3
Potassium: 4.7 mmol/L (ref 3.5–5.1)
Sodium: 133 mmol/L — ABNORMAL LOW (ref 135–145)
TCO2: 29 mmol/L (ref 22–32)
pCO2 arterial: 39.9 mmHg (ref 32.0–48.0)
pH, Arterial: 7.449 (ref 7.350–7.450)
pO2, Arterial: 200 mmHg — ABNORMAL HIGH (ref 83.0–108.0)

## 2019-10-07 LAB — GLUCOSE, CAPILLARY
Glucose-Capillary: 127 mg/dL — ABNORMAL HIGH (ref 70–99)
Glucose-Capillary: 115 mg/dL — ABNORMAL HIGH (ref 70–99)
Glucose-Capillary: 115 mg/dL — ABNORMAL HIGH (ref 70–99)
Glucose-Capillary: 137 mg/dL — ABNORMAL HIGH (ref 70–99)

## 2019-10-07 LAB — POCT I-STAT, CHEM 8
BUN: 30 mg/dL — ABNORMAL HIGH (ref 8–23)
Calcium, Ion: 1.12 mmol/L — ABNORMAL LOW (ref 1.15–1.40)
Chloride: 104 mmol/L (ref 98–111)
Creatinine, Ser: 0.7 mg/dL (ref 0.61–1.24)
Glucose, Bld: 121 mg/dL — ABNORMAL HIGH (ref 70–99)
HCT: 37 % — ABNORMAL LOW (ref 39.0–52.0)
Hemoglobin: 12.6 g/dL — ABNORMAL LOW (ref 13.0–17.0)
Potassium: 4.7 mmol/L (ref 3.5–5.1)
Sodium: 137 mmol/L (ref 135–145)
TCO2: 28 mmol/L (ref 22–32)

## 2019-10-07 LAB — TYPE AND SCREEN
ABO/RH(D): O POS
Antibody Screen: NEGATIVE

## 2019-10-07 LAB — BASIC METABOLIC PANEL
Anion gap: 10 (ref 5–15)
BUN: 35 mg/dL — ABNORMAL HIGH (ref 8–23)
CO2: 30 mmol/L (ref 22–32)
Calcium: 8.8 mg/dL — ABNORMAL LOW (ref 8.9–10.3)
Chloride: 97 mmol/L — ABNORMAL LOW (ref 98–111)
Creatinine, Ser: 0.88 mg/dL (ref 0.61–1.24)
GFR calc Af Amer: >60 mL/min (ref >60)
GFR calc non Af Amer: >60 mL/min (ref >60)
Glucose, Bld: 134 mg/dL — ABNORMAL HIGH (ref 70–99)
Potassium: 4.7 mmol/L (ref 3.5–5.1)
Sodium: 137 mmol/L (ref 135–145)

## 2019-10-07 LAB — MAGNESIUM: Magnesium: 2.2 mg/dL (ref 1.7–2.4)

## 2019-10-07 LAB — ABO/RH: ABO/RH(D): O POS

## 2019-10-07 SURGERY — CRANIOTOMY TUMOR EXCISION
Anesthesia: General | Laterality: Right

## 2019-10-07 MED ORDER — FENTANYL CITRATE (PF) 100 MCG/2ML IJ SOLN
25.0000 ug | INTRAMUSCULAR | Status: DC | PRN
  Administered 2019-10-07: 50 ug via INTRAVENOUS

## 2019-10-07 MED ORDER — ARTIFICIAL TEARS OPHTHALMIC OINT
TOPICAL_OINTMENT | OPHTHALMIC | Status: DC | PRN
  Administered 2019-10-07: 1 via OPHTHALMIC

## 2019-10-07 MED ORDER — PHENYLEPHRINE HCL-NACL 10-0.9 MG/250ML-% IV SOLN
INTRAVENOUS | Status: DC | PRN
  Administered 2019-10-07: 50 ug/min via INTRAVENOUS

## 2019-10-07 MED ORDER — PROPOFOL 10 MG/ML IV BOLUS
INTRAVENOUS | Status: DC | PRN
  Administered 2019-10-07: 50 mg via INTRAVENOUS
  Administered 2019-10-07: 30 mg via INTRAVENOUS
  Administered 2019-10-07: 20 mg via INTRAVENOUS
  Administered 2019-10-07: 30 mg via INTRAVENOUS
  Administered 2019-10-07: 20 mg via INTRAVENOUS
  Administered 2019-10-07: 150 mg via INTRAVENOUS

## 2019-10-07 MED ORDER — DEXAMETHASONE 4 MG PO TABS: 4.0000 mg | ORAL_TABLET | Freq: Four times a day (QID) | ORAL | Status: DC

## 2019-10-07 MED ORDER — BACITRACIN ZINC 500 UNIT/GM EX OINT
TOPICAL_OINTMENT | CUTANEOUS | Status: AC
  Filled 2019-10-07: qty 28.35

## 2019-10-07 MED ORDER — LIDOCAINE-EPINEPHRINE 1 %-1:100000 IJ SOLN
INTRAMUSCULAR | Status: AC
  Filled 2019-10-07: qty 1

## 2019-10-07 MED ORDER — PROPOFOL 500 MG/50ML IV EMUL
INTRAVENOUS | Status: DC | PRN
  Administered 2019-10-07: 50 ug/kg/min via INTRAVENOUS

## 2019-10-07 MED ORDER — PHENYLEPHRINE 40 MCG/ML (10ML) SYRINGE FOR IV PUSH (FOR BLOOD PRESSURE SUPPORT)
PREFILLED_SYRINGE | INTRAVENOUS | Status: AC
  Filled 2019-10-07: qty 10

## 2019-10-07 MED ORDER — MANNITOL 25 % IV SOLN
INTRAVENOUS | Status: DC | PRN
  Administered 2019-10-07: 34 g via INTRAVENOUS

## 2019-10-07 MED ORDER — OXYCODONE HCL 5 MG PO TABS
5.0000 mg | ORAL_TABLET | ORAL | Status: DC | PRN
  Administered 2019-10-08 – 2019-10-09 (×3): 5 mg via ORAL
  Filled 2019-10-07 (×3): qty 1

## 2019-10-07 MED ORDER — THROMBIN 5000 UNITS EX SOLR
OROMUCOSAL | Status: DC | PRN
  Administered 2019-10-07: 13:00:00 5 mL via TOPICAL

## 2019-10-07 MED ORDER — ESMOLOL HCL 100 MG/10ML IV SOLN
INTRAVENOUS | Status: AC
  Filled 2019-10-07: qty 10

## 2019-10-07 MED ORDER — SUCCINYLCHOLINE CHLORIDE 200 MG/10ML IV SOSY
PREFILLED_SYRINGE | INTRAVENOUS | Status: AC
  Filled 2019-10-07: qty 10

## 2019-10-07 MED ORDER — ONDANSETRON HCL 4 MG/2ML IJ SOLN: 4.0000 mg | Freq: Once | INTRAMUSCULAR | Status: DC | PRN

## 2019-10-07 MED ORDER — ESMOLOL HCL 100 MG/10ML IV SOLN
INTRAVENOUS | Status: DC | PRN
  Administered 2019-10-07: 20 mg via INTRAVENOUS

## 2019-10-07 MED ORDER — ACETAMINOPHEN 10 MG/ML IV SOLN: 1000.0000 mg | Freq: Once | INTRAVENOUS | Status: DC | PRN

## 2019-10-07 MED ORDER — REMIFENTANIL HCL 1 MG IV SOLR
INTRAVENOUS | Status: DC | PRN
  Administered 2019-10-07: .2 ug/kg/min via INTRAVENOUS

## 2019-10-07 MED ORDER — DEXAMETHASONE SODIUM PHOSPHATE 10 MG/ML IJ SOLN
INTRAMUSCULAR | Status: AC
  Filled 2019-10-07: qty 1

## 2019-10-07 MED ORDER — HEMOSTATIC AGENTS (NO CHARGE) OPTIME
TOPICAL | Status: DC | PRN
  Administered 2019-10-07: 1 via TOPICAL

## 2019-10-07 MED ORDER — LACTATED RINGERS IV SOLN: INTRAVENOUS | Status: DC

## 2019-10-07 MED ORDER — 0.9 % SODIUM CHLORIDE (POUR BTL) OPTIME
TOPICAL | Status: DC | PRN
  Administered 2019-10-07: 3000 mL

## 2019-10-07 MED ORDER — ONDANSETRON HCL 4 MG/2ML IJ SOLN
INTRAMUSCULAR | Status: AC
  Filled 2019-10-07: qty 2

## 2019-10-07 MED ORDER — SODIUM CHLORIDE 0.9 % IV SOLN: 4.0000 mg | Freq: Four times a day (QID) | INTRAVENOUS | Status: DC

## 2019-10-07 MED ORDER — ONDANSETRON HCL 4 MG/2ML IJ SOLN
INTRAMUSCULAR | Status: DC | PRN
  Administered 2019-10-07: 4 mg via INTRAVENOUS

## 2019-10-07 MED ORDER — LIDOCAINE 2% (20 MG/ML) 5 ML SYRINGE
INTRAMUSCULAR | Status: AC
  Filled 2019-10-07: qty 5

## 2019-10-07 MED ORDER — ROCURONIUM BROMIDE 10 MG/ML (PF) SYRINGE
PREFILLED_SYRINGE | INTRAVENOUS | Status: AC
  Filled 2019-10-07: qty 10

## 2019-10-07 MED ORDER — CHLORHEXIDINE GLUCONATE CLOTH 2 % EX PADS
6.0000 | MEDICATED_PAD | Freq: Every day | CUTANEOUS | Status: DC
  Administered 2019-10-07 – 2019-10-10 (×4): 6 via TOPICAL

## 2019-10-07 MED ORDER — SODIUM CHLORIDE 0.9 % IV SOLN: INTRAVENOUS | Status: DC | PRN

## 2019-10-07 MED ORDER — SUGAMMADEX SODIUM 200 MG/2ML IV SOLN
INTRAVENOUS | Status: DC | PRN
  Administered 2019-10-07: 100 mg via INTRAVENOUS

## 2019-10-07 MED ORDER — BACITRACIN ZINC 500 UNIT/GM EX OINT
TOPICAL_OINTMENT | CUTANEOUS | Status: DC | PRN
  Administered 2019-10-07: 1 via TOPICAL

## 2019-10-07 MED ORDER — FENTANYL CITRATE (PF) 100 MCG/2ML IJ SOLN
INTRAMUSCULAR | Status: AC
  Filled 2019-10-07: qty 2

## 2019-10-07 MED ORDER — PROPOFOL 10 MG/ML IV BOLUS
INTRAVENOUS | Status: AC
  Filled 2019-10-07: qty 40

## 2019-10-07 MED ORDER — CLEVIDIPINE BUTYRATE 0.5 MG/ML IV EMUL
INTRAVENOUS | Status: AC
  Filled 2019-10-07: qty 50

## 2019-10-07 MED ORDER — LABETALOL HCL 5 MG/ML IV SOLN
INTRAVENOUS | Status: DC | PRN
  Administered 2019-10-07 (×3): 5 mg via INTRAVENOUS

## 2019-10-07 MED ORDER — PHENYLEPHRINE 40 MCG/ML (10ML) SYRINGE FOR IV PUSH (FOR BLOOD PRESSURE SUPPORT)
PREFILLED_SYRINGE | INTRAVENOUS | Status: DC | PRN
  Administered 2019-10-07 (×2): 40 ug via INTRAVENOUS
  Administered 2019-10-07: 80 ug via INTRAVENOUS
  Administered 2019-10-07: 40 ug via INTRAVENOUS
  Administered 2019-10-07 (×3): 80 ug via INTRAVENOUS

## 2019-10-07 MED ORDER — FENTANYL CITRATE (PF) 250 MCG/5ML IJ SOLN
INTRAMUSCULAR | Status: DC | PRN
  Administered 2019-10-07: 100 ug via INTRAVENOUS
  Administered 2019-10-07: 150 ug via INTRAVENOUS

## 2019-10-07 MED ORDER — LABETALOL HCL 5 MG/ML IV SOLN
INTRAVENOUS | Status: AC
  Filled 2019-10-07: qty 4

## 2019-10-07 MED ORDER — THROMBIN 5000 UNITS EX SOLR
CUTANEOUS | Status: AC
  Filled 2019-10-07: qty 5000

## 2019-10-07 MED ORDER — CEFAZOLIN SODIUM 1 G IJ SOLR
INTRAMUSCULAR | Status: AC
  Filled 2019-10-07: qty 20

## 2019-10-07 MED ORDER — CEFAZOLIN SODIUM-DEXTROSE 2-3 GM-%(50ML) IV SOLR
INTRAVENOUS | Status: DC | PRN
  Administered 2019-10-07: 2 g via INTRAVENOUS

## 2019-10-07 MED ORDER — SODIUM CHLORIDE 0.9 % IV SOLN
0.0500 ug/kg/min | INTRAVENOUS | Status: DC
  Filled 2019-10-07: qty 5000

## 2019-10-07 MED ORDER — THROMBIN 20000 UNITS EX SOLR
CUTANEOUS | Status: DC | PRN
  Administered 2019-10-07: 20 mL via TOPICAL

## 2019-10-07 MED ORDER — SUCCINYLCHOLINE CHLORIDE 20 MG/ML IJ SOLN
INTRAMUSCULAR | Status: DC | PRN
  Administered 2019-10-07: 100 mg via INTRAVENOUS

## 2019-10-07 MED ORDER — LIDOCAINE-EPINEPHRINE 1 %-1:100000 IJ SOLN
INTRAMUSCULAR | Status: DC | PRN
  Administered 2019-10-07: 8 mL

## 2019-10-07 MED ORDER — LIDOCAINE 2% (20 MG/ML) 5 ML SYRINGE
INTRAMUSCULAR | Status: DC | PRN
  Administered 2019-10-07: 60 mg via INTRAVENOUS
  Administered 2019-10-07: 40 mg via INTRAVENOUS

## 2019-10-07 MED ORDER — ROCURONIUM BROMIDE 100 MG/10ML IV SOLN
INTRAVENOUS | Status: DC | PRN
  Administered 2019-10-07: 10 mg via INTRAVENOUS

## 2019-10-07 MED ORDER — DEXAMETHASONE 4 MG PO TABS
4.0000 mg | ORAL_TABLET | Freq: Four times a day (QID) | ORAL | Status: DC
  Administered 2019-10-07 – 2019-10-08 (×3): 4 mg via ORAL
  Filled 2019-10-07 (×3): qty 1

## 2019-10-07 MED ORDER — TRAMADOL HCL 50 MG PO TABS
50.0000 mg | ORAL_TABLET | Freq: Four times a day (QID) | ORAL | Status: DC | PRN
  Administered 2019-10-07 – 2019-10-10 (×4): 50 mg via ORAL
  Filled 2019-10-07 (×4): qty 1

## 2019-10-07 MED ORDER — ALBUMIN HUMAN 5 % IV SOLN: INTRAVENOUS | Status: DC | PRN

## 2019-10-07 MED ORDER — CLEVIDIPINE BUTYRATE 0.5 MG/ML IV EMUL
0.0000 mg/h | INTRAVENOUS | Status: DC
  Administered 2019-10-07: 2 mg/h via INTRAVENOUS
  Administered 2019-10-07: 1 mg/h via INTRAVENOUS

## 2019-10-07 MED ORDER — THROMBIN 20000 UNITS EX SOLR
CUTANEOUS | Status: AC
  Filled 2019-10-07: qty 20000

## 2019-10-07 MED ORDER — FENTANYL CITRATE (PF) 250 MCG/5ML IJ SOLN
INTRAMUSCULAR | Status: AC
  Filled 2019-10-07: qty 5

## 2019-10-07 MED ORDER — DEXAMETHASONE SODIUM PHOSPHATE 4 MG/ML IJ SOLN: 4.0000 mg | Freq: Four times a day (QID) | INTRAMUSCULAR | Status: DC

## 2019-10-07 MED ORDER — PHENYLEPHRINE HCL-NACL 10-0.9 MG/250ML-% IV SOLN
INTRAVENOUS | Status: AC
  Filled 2019-10-07: qty 250

## 2019-10-07 MED ORDER — DEXAMETHASONE SODIUM PHOSPHATE 10 MG/ML IJ SOLN
INTRAMUSCULAR | Status: DC | PRN
  Administered 2019-10-07: 5 mg via INTRAVENOUS

## 2019-10-07 SURGICAL SUPPLY — 99 items
BENZOIN TINCTURE PRP APPL 2/3 (GAUZE/BANDAGES/DRESSINGS) IMPLANT
BLADE CLIPPER SURG (BLADE) ×2 IMPLANT
BLADE SAW GIGLI 16 STRL (MISCELLANEOUS) IMPLANT
BLADE SURG 15 STRL LF DISP TIS (BLADE) IMPLANT
BLADE SURG 15 STRL SS (BLADE)
BNDG GAUZE ELAST 4 BULKY (GAUZE/BANDAGES/DRESSINGS) IMPLANT
BNDG STRETCH 4X75 STRL LF (GAUZE/BANDAGES/DRESSINGS) IMPLANT
BUR ACORN 9.0 PRECISION (BURR) ×2 IMPLANT
BUR ROUND FLUTED 4 SOFT TCH (BURR) IMPLANT
BUR SPIRAL ROUTER 2.3 (BUR) ×2 IMPLANT
CANISTER SUCT 3000ML PPV (MISCELLANEOUS) ×4 IMPLANT
CATH COUDE FOLEY 2W 5CC 16FR (CATHETERS) ×2 IMPLANT
CATH VENTRIC 35X38 W/TROCAR LG (CATHETERS) IMPLANT
CLIP VESOCCLUDE MED 6/CT (CLIP) IMPLANT
CNTNR URN SCR LID CUP LEK RST (MISCELLANEOUS) ×1 IMPLANT
CONT SPEC 4OZ STRL OR WHT (MISCELLANEOUS) ×1
COVER MAYO STAND STRL (DRAPES) IMPLANT
COVER WAND RF STERILE (DRAPES) IMPLANT
DECANTER SPIKE VIAL GLASS SM (MISCELLANEOUS) ×2 IMPLANT
DERMABOND ADVANCED (GAUZE/BANDAGES/DRESSINGS) ×1
DERMABOND ADVANCED .7 DNX12 (GAUZE/BANDAGES/DRESSINGS) ×1 IMPLANT
DRAIN SUBARACHNOID (WOUND CARE) IMPLANT
DRAPE HALF SHEET 40X57 (DRAPES) ×2 IMPLANT
DRAPE MICROSCOPE LEICA (MISCELLANEOUS) ×2 IMPLANT
DRAPE NEUROLOGICAL W/INCISE (DRAPES) ×2 IMPLANT
DRAPE STERI IOBAN 125X83 (DRAPES) IMPLANT
DRAPE SURG 17X23 STRL (DRAPES) IMPLANT
DRAPE WARM FLUID 44X44 (DRAPES) ×2 IMPLANT
DRSG ADAPTIC 3X8 NADH LF (GAUZE/BANDAGES/DRESSINGS) IMPLANT
DRSG TELFA 3X8 NADH (GAUZE/BANDAGES/DRESSINGS) ×2 IMPLANT
DURAPREP 6ML APPLICATOR 50/CS (WOUND CARE) ×2 IMPLANT
ELECT REM PT RETURN 9FT ADLT (ELECTROSURGICAL) ×2
ELECTRODE REM PT RTRN 9FT ADLT (ELECTROSURGICAL) ×1 IMPLANT
EVACUATOR 1/8 PVC DRAIN (DRAIN) IMPLANT
EVACUATOR SILICONE 100CC (DRAIN) IMPLANT
FEE INTRAOP MONITOR IMPULS NCS (MISCELLANEOUS) ×1 IMPLANT
FORCEPS BIPOLAR MALIS 8X.5 DIS (ORTHOPEDIC DISPOSABLE SUPPLIES) ×2 IMPLANT
FORCEPS BIPOLAR SPETZLER 8 1.0 (NEUROSURGERY SUPPLIES) IMPLANT
GAUZE 4X4 16PLY RFD (DISPOSABLE) IMPLANT
GAUZE SPONGE 4X4 12PLY STRL (GAUZE/BANDAGES/DRESSINGS) IMPLANT
GLOVE BIO SURGEON STRL SZ7 (GLOVE) IMPLANT
GLOVE BIO SURGEON STRL SZ7.5 (GLOVE) ×2 IMPLANT
GLOVE BIOGEL PI IND STRL 7.0 (GLOVE) IMPLANT
GLOVE BIOGEL PI IND STRL 7.5 (GLOVE) ×1 IMPLANT
GLOVE BIOGEL PI INDICATOR 7.0 (GLOVE)
GLOVE BIOGEL PI INDICATOR 7.5 (GLOVE) ×1
GLOVE EXAM NITRILE LRG STRL (GLOVE) IMPLANT
GLOVE EXAM NITRILE XL STR (GLOVE) IMPLANT
GLOVE EXAM NITRILE XS STR PU (GLOVE) IMPLANT
GOWN STRL REUS W/ TWL LRG LVL3 (GOWN DISPOSABLE) ×2 IMPLANT
GOWN STRL REUS W/ TWL XL LVL3 (GOWN DISPOSABLE) IMPLANT
GOWN STRL REUS W/TWL 2XL LVL3 (GOWN DISPOSABLE) IMPLANT
GOWN STRL REUS W/TWL LRG LVL3 (GOWN DISPOSABLE) ×2
GOWN STRL REUS W/TWL XL LVL3 (GOWN DISPOSABLE)
HEMOSTAT POWDER KIT SURGIFOAM (HEMOSTASIS) ×2 IMPLANT
HEMOSTAT SURGICEL 2X14 (HEMOSTASIS) ×2 IMPLANT
HOOK DURA 1/2IN (MISCELLANEOUS) ×2 IMPLANT
INTRAOP MONITOR FEE IMPULS NCS (MISCELLANEOUS) ×1
INTRAOP MONITOR FEE IMPULSE (MISCELLANEOUS) ×1
IV NS 1000ML (IV SOLUTION) ×1
IV NS 1000ML BAXH (IV SOLUTION) ×1 IMPLANT
KIT BASIN OR (CUSTOM PROCEDURE TRAY) ×2 IMPLANT
KIT DRAIN CSF ACCUDRAIN (MISCELLANEOUS) IMPLANT
KIT TURNOVER KIT B (KITS) ×2 IMPLANT
NEEDLE HYPO 22GX1.5 SAFETY (NEEDLE) ×2 IMPLANT
NEEDLE SPNL 18GX3.5 QUINCKE PK (NEEDLE) IMPLANT
NS IRRIG 1000ML POUR BTL (IV SOLUTION) ×6 IMPLANT
PACK BATTERY CMF DISP FOR DVR (ORTHOPEDIC DISPOSABLE SUPPLIES) ×2 IMPLANT
PACK CRANIOTOMY CUSTOM (CUSTOM PROCEDURE TRAY) ×2 IMPLANT
PATTIES SURGICAL .25X.25 (GAUZE/BANDAGES/DRESSINGS) IMPLANT
PATTIES SURGICAL .5 X.5 (GAUZE/BANDAGES/DRESSINGS) IMPLANT
PATTIES SURGICAL .5 X3 (DISPOSABLE) IMPLANT
PATTIES SURGICAL 1/4 X 3 (GAUZE/BANDAGES/DRESSINGS) IMPLANT
PATTIES SURGICAL 1X1 (DISPOSABLE) IMPLANT
PIN MAYFIELD SKULL DISP (PIN) ×2 IMPLANT
PLATE MALL UNIV 0.3 (Plate) ×2 IMPLANT
PROBE NERVBE PRASS .33 (MISCELLANEOUS) ×2 IMPLANT
RUBBERBAND STERILE (MISCELLANEOUS) ×4 IMPLANT
SCREW UNIII AXS SD 1.5X4 (Screw) ×12 IMPLANT
SEALANT ADHERUS EXTEND TIP (MISCELLANEOUS) ×2 IMPLANT
SET TUBING IRRIGATION DISP (TUBING) ×2 IMPLANT
SPECIMEN JAR SMALL (MISCELLANEOUS) ×4 IMPLANT
SPONGE NEURO XRAY DETECT 1X3 (DISPOSABLE) IMPLANT
SPONGE SURGIFOAM ABS GEL 100 (HEMOSTASIS) ×2 IMPLANT
STAPLER VISISTAT 35W (STAPLE) ×2 IMPLANT
SUT ETHILON 3 0 FSL (SUTURE) IMPLANT
SUT ETHILON 3 0 PS 1 (SUTURE) IMPLANT
SUT MNCRL AB 3-0 PS2 18 (SUTURE) IMPLANT
SUT MNCRL AB 3-0 PS2 27 (SUTURE) ×2 IMPLANT
SUT NURALON 4 0 TR CR/8 (SUTURE) ×2 IMPLANT
SUT SILK 0 TIES 10X30 (SUTURE) IMPLANT
SUT VIC AB 2-0 CP2 18 (SUTURE) ×2 IMPLANT
SUT VIC AB 3-0 SH 8-18 (SUTURE) ×2 IMPLANT
TOWEL GREEN STERILE (TOWEL DISPOSABLE) ×2 IMPLANT
TOWEL GREEN STERILE FF (TOWEL DISPOSABLE) ×2 IMPLANT
TRAY FOLEY MTR SLVR 16FR STAT (SET/KITS/TRAYS/PACK) ×2 IMPLANT
TUBE CONNECTING 12X1/4 (SUCTIONS) ×2 IMPLANT
UNDERPAD 30X30 (UNDERPADS AND DIAPERS) ×2 IMPLANT
WATER STERILE IRR 1000ML POUR (IV SOLUTION) ×2 IMPLANT

## 2019-10-07 NOTE — Progress Notes (Signed)
Patient already taken to the OR by the time arrived on the floor to round on him.  I have reviewed  the patient's chart for patient's stability, spoke with the patient's RN and Dr. Zada Finders this morning.  Patient stable, will no changes preoperatively at this time.  Postop course to be determined after the surgery.  I requested the RN staff to page me if necessary.  I will try to see him later today if possible otherwise I will see tomorrow.  Gerlean Ren MD

## 2019-10-07 NOTE — Anesthesia Procedure Notes (Signed)
Procedure Name: Intubation Date/Time: 10/07/2019 11:25 AM Performed by: Janene Harvey, CRNA Pre-anesthesia Checklist: Patient identified, Emergency Drugs available, Suction available and Patient being monitored Patient Re-evaluated:Patient Re-evaluated prior to induction Oxygen Delivery Method: Circle system utilized Preoxygenation: Pre-oxygenation with 100% oxygen Induction Type: IV induction Ventilation: Mask ventilation without difficulty Laryngoscope Size: Mac and 3 Grade View: Grade I Tube type: Oral Tube size: 7.5 mm Number of attempts: 1 Airway Equipment and Method: Stylet and Oral airway Placement Confirmation: ETT inserted through vocal cords under direct vision,  positive ETCO2 and breath sounds checked- equal and bilateral Secured at: 22 cm Tube secured with: Tape Dental Injury: Teeth and Oropharynx as per pre-operative assessment  Comments: Intubation by Maryelizabeth Rowan

## 2019-10-07 NOTE — Progress Notes (Signed)
Pt admit to 4N19 at 1705. Pt placed to unit monitor. Cleviprex infusing at 2 mg/hr to maintain SBP <160 per Aline. See flowsheets for complete assessment.

## 2019-10-07 NOTE — Progress Notes (Signed)
Neurosurgery Service Post-operative progress note  Assessment & Plan: 64 y.o. man s/p retrosig for CPA mass, seen in PACU, face appears stable, HB1 vs HB2, R abducens palsy present, facial sensation improved, FCx4. Overall, doing well, function better than expected.  -4N ICU overnight -MRI w/wo in AM -advance diet as tolerated  Judith Part  10/07/19 3:14 PM

## 2019-10-07 NOTE — Progress Notes (Signed)
Mr. Frana requested [place].  Chaplain explained this is beyond the scope of our notorial duties here.    De Burrs Chaplain Resident

## 2019-10-07 NOTE — Progress Notes (Signed)
OT Cancellation Note  Patient Details Name: Casey Richard MRN: [id number] DOB: May 26, 1956   Cancelled Treatment:    Reason Eval/Treat Not Completed: Patient at procedure or test/ unavailable. Pt currently in surgery at this time. OT will follow up at a later date as time allows.   Mauri Brooklyn 10/07/2019, 9:33 AM

## 2019-10-07 NOTE — Anesthesia Procedure Notes (Signed)
Arterial Line Insertion Start/End4/19/2021 10:00 AM, 10/07/2019 10:15 AM Performed by: Josephine Igo, CRNA, CRNA  Patient location: Pre-op. Preanesthetic checklist: patient identified, IV checked, surgical consent, monitors and equipment checked and pre-op evaluation Lidocaine 1% used for infiltration Left, radial was placed Catheter size: 20 G Hand hygiene performed , maximum sterile barriers used  and Seldinger technique used  Attempts: 1 Procedure performed without using ultrasound guided technique. Following insertion, dressing applied and Biopatch. Post procedure assessment: normal  Patient tolerated the procedure well with no immediate complications.

## 2019-10-07 NOTE — Anesthesia Preprocedure Evaluation (Addendum)
Anesthesia Evaluation  Patient identified by MRN, date of birth, ID band Patient awake    Reviewed: Allergy & Precautions, NPO status , Patient's Chart, lab work & pertinent test results  Airway Mallampati: II  TM Distance: >3 FB Neck ROM: Full    Dental  (+) Chipped,    Pulmonary former smoker,    Pulmonary exam normal breath sounds clear to auscultation       Cardiovascular + CAD and + Past MI  Normal cardiovascular exam+ pacemaker  Rhythm:Regular Rate:Normal  ECG: NSR, rate 70. LVH   Neuro/Psych MRI of Brain 1. 4.7 x 3.4 x 4.1 cm multi-cystic right cerebellopontine angle mass with associated abnormal enhancement extending into the right internal auditory canal. 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 Significant associated mass effect upon the right cerebellopontine and cerebellomedullary angle with partial effacement of the fourth ventricle and mild adjacent parenchymal edema. No hydrocephalus. negative psych ROS   GI/Hepatic negative GI ROS, Neg liver ROS,   Endo/Other  negative endocrine ROS  Renal/GU Renal disease     Musculoskeletal negative musculoskeletal ROS (+)   Abdominal   Peds  Hematology negative hematology ROS (+)   Anesthesia Other Findings Vestibular Schwannoma  Reproductive/Obstetrics                           Anesthesia Physical Anesthesia Plan  ASA: III  Anesthesia Plan: General   Post-op Pain Management:    Induction: Intravenous  PONV Risk Score and Plan: 2 and Ondansetron, Dexamethasone and Treatment may vary due to age or medical condition  Airway Management Planned: Oral ETT  Additional Equipment: Arterial line  Intra-op Plan:   Post-operative Plan: Possible Post-op intubation/ventilation  Informed Consent: I have reviewed the patients History and Physical, chart, labs and discussed the procedure including the risks, benefits and  alternatives for the proposed anesthesia with the patient or authorized representative who has indicated his/her understanding and acceptance.     Dental advisory given  Plan Discussed with: CRNA  Anesthesia Plan Comments:        Anesthesia Quick Evaluation

## 2019-10-07 NOTE — Progress Notes (Signed)
Neurosurgery Service Progress Note  Subjective: NAE ON, no new complaints this morning  Objective: Vitals:   10/06/19 2317 10/07/19 0411 10/07/19 0757 10/07/19 0941  BP: 125/90 (!) 143/88 (!) 164/96   Pulse: 77 70 70   Resp: 15 17 18    Temp: 97.8 F (36.6 C) 98 F (36.7 C) 98.2 F (36.8 C)   TempSrc: Oral Oral Oral   SpO2: 97% 96% 99%   Weight:    68 kg  Height:    5\' 10"  (1.778 m)   Temp (24hrs), Avg:98 F (36.7 C), Min:97.6 F (36.4 C), Max:98.2 F (36.8 C)  CBC Latest Ref Rng & Units 10/07/2019 10/05/2019 10/04/2019  WBC 4.0 - 10.5 K/uL 21.2(H) 19.9(H) 19.0(H)  Hemoglobin 13.0 - 17.0 g/dL 13.8 13.2 14.1  Hematocrit 39.0 - 52.0 % 41.1 40.3 43.2  Platelets 150 - 400 K/uL 184 189 208   BMP Latest Ref Rng & Units 10/07/2019 10/05/2019 10/04/2019  Glucose 70 - 99 mg/dL 134(H) 139(H) 154(H)  BUN 8 - 23 mg/dL 35(H) 33(H) 32(H)  Creatinine 0.61 - 1.24 mg/dL 0.88 0.85 0.85  Sodium 135 - 145 mmol/L 137 136 138  Potassium 3.5 - 5.1 mmol/L 4.7 4.6 4.2  Chloride 98 - 111 mmol/L 97(L) 100 99  CO2 22 - 32 mmol/L 30 31 29   Calcium 8.9 - 10.3 mg/dL 8.8(L) 8.5(L) 8.8(L)    Intake/Output Summary (Last 24 hours) at 10/07/2019 1102 Last data filed at 10/07/2019 1036 Gross per 24 hour  Intake 600 ml  Output 1350 ml  Net -750 ml    Current Facility-Administered Medications:  .  [MAR Hold] cholecalciferol (VITAMIN D3) tablet 4,000 Units, 4,000 Units, Oral, Daily, Rai, Ripudeep K, MD, 4,000 Units at 10/06/19 1124 .  [MAR Hold] dexamethasone (DECADRON) injection 4 mg, 4 mg, Intravenous, Q6H, Rai, Ripudeep K, MD, 4 mg at 10/07/19 0519 .  [MAR Hold] feeding supplement (ENSURE ENLIVE) (ENSURE ENLIVE) liquid 237 mL, 237 mL, Oral, TID BM, Amin, Ankit Chirag, MD, 237 mL at 10/06/19 1933 .  [MAR Hold] insulin aspart (novoLOG) injection 0-6 Units, 0-6 Units, Subcutaneous, TID WC, Amin, Ankit Chirag, MD, 1 Units at 10/01/19 1015 .  lactated ringers infusion, , Intravenous, Continuous, Ellender, Karyl Kinnier, MD, Stopped at 10/07/19 1046 .  [MAR Hold] melatonin tablet 3 mg, 3 mg, Oral, QHS, Blount, Xenia T, NP, 3 mg at 10/06/19 2256 .  [MAR Hold] multivitamin with minerals tablet 1 tablet, 1 tablet, Oral, Daily, Rai, Ripudeep K, MD, 1 tablet at 10/06/19 1125 .  [MAR Hold] ondansetron (ZOFRAN) tablet 4 mg, 4 mg, Oral, Q6H PRN **OR** [MAR Hold] ondansetron (ZOFRAN) injection 4 mg, 4 mg, Intravenous, Q6H PRN, Rai, Ripudeep K, MD .  Doug Sou Hold] polyethylene glycol (MIRALAX / GLYCOLAX) packet 17 g, 17 g, Oral, Daily PRN, Amin, Ankit Chirag, MD, 17 g at 10/02/19 0929 .  remifentanil (ULTIVA) 5000 mcg in 250 mL normal saline (20 mcg/mL) for ICU use, 0.05 mcg/kg/min, Intravenous, To OR, Najeh Credit, Joyice Faster, MD .  Doug Sou Hold] senna (SENOKOT) tablet 17.2 mg, 2 tablet, Oral, QHS, Hongalgi, Anand D, MD, 17.2 mg at 10/06/19 2256 .  [MAR Hold] senna-docusate (Senokot-S) tablet 2 tablet, 2 tablet, Oral, QHS PRN, Amin, Jeanella Flattery, MD .  Doug Sou Hold] vitamin B-12 (CYANOCOBALAMIN) tablet 2,000 mcg, 2,000 mcg, Oral, Daily, Rai, Ripudeep K, MD, 2,000 mcg at 10/06/19 1125  Facility-Administered Medications Ordered in Other Encounters:  .  0.9 %  sodium chloride infusion, , Intravenous, Continuous PRN, Janene Harvey, CRNA,  New Bag at 10/07/19 1045 .  0.9 %  sodium chloride infusion, , Intravenous, Continuous PRN, Janene Harvey, CRNA, New Bag at 10/07/19 1045 .  fluconazole (DIFLUCAN) IVPB 100 mg, 100 mg, Intravenous, Q24H, Raynelle Bring, MD   Physical Exam: AOx3 stable very poor short term recall, PERRL, EOM w/ partial 6th OD, R HB2 facial weakness - eye closure complete but very slow, dense R V1-V3 numbness, some pharyngeal asymmetry, strength 5/5x4, SILTx4 but some mild L sided subjective numbness in LUE > RUE, dense R hearing loss  Assessment & Plan: 64 y.o. 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 Also notable for left  sellar mass, not compressing the chiasm.  -OR today for tumor resection -4N ICU post-op  Judith Part  10/07/19 11:02 AM

## 2019-10-07 NOTE — Anesthesia Postprocedure Evaluation (Signed)
Anesthesia Post Note  Patient: Casey Richard  Procedure(s) Performed: RIGHT CRANIOTOMY TUMOR EXCISION (Right )     Patient location during evaluation: PACU Anesthesia Type: General Level of consciousness: awake Pain management: pain level controlled Vital Signs Assessment: post-procedure vital signs reviewed and stable Respiratory status: spontaneous breathing, nonlabored ventilation, respiratory function stable and patient connected to nasal cannula oxygen Cardiovascular status: blood pressure returned to baseline and stable Postop Assessment: no apparent nausea or vomiting Anesthetic complications: no    Last Vitals:  Vitals:   10/07/19 1630 10/07/19 1700  BP: 138/80 133/79  Pulse: 70 68  Resp: 19 16  Temp: (!) 36.2 C 36.6 C  SpO2: 94% 97%    Last Pain:  Vitals:   10/07/19 1700  TempSrc: Oral  PainSc:                  Zamire Whitehurst P Shyann Hefner

## 2019-10-07 NOTE — Op Note (Signed)
PATIENT: Casey Richard  DAY OF SURGERY: 10/07/19   PRE-OPERATIVE DIAGNOSIS:  Cerebellopontine angle tumor   POST-OPERATIVE DIAGNOSIS:  Same   PROCEDURE:  Right retrosigmoid craniotomy for tumor resection   SURGEON:  Surgeon(s) and Role:    Judith Part, MD - Primary   ANESTHESIA: ETGA   BRIEF HISTORY: This is a 64 year old man who presented with progressive weight loss, headaches, and ataxia. The patient was found to have multiple cranial nerve deficits on the right with a very large right CPA mass. This was discussed with the patient as well as risks, benefits, and alternatives and wished to proceed with surgical resection.   OPERATIVE DETAIL: The patient was taken to the operating room and placed on the OR table in the supine position. A formal time out was performed with two patient identifiers and confirmed the operative site. Anesthesia was induced by the anesthesia team. The [place] head holder was applied to the head and the head was turned to the left. A large retrosigmoid incision was marked. Hair was clipped with surgical clippers over the incision and the area was then prepped and draped in a sterile fashion.  An expanded retrosigmoid craniectomy was created, given the size of the pathology. The sinus was exposed and protected. The dura was opened and CSF was drained to minimize retraction. The tumor capsule was immediately identified. The only nerves clearly visible were the lower cranial nerves, so the tumor capsule was stimulated here and no facial response occurred with stimulation. It was incised and the cystic contents were evacuated to create working room. After decompression, the facial nerve was still not visible and the cyst walls were very complex. The trigeminal nerve was clearly visible, so this was dissected free and the cyst was further internally debulked. I was then able to stimulate the facial nerve inferomedially at the brainstem and was able to debulk the  tumor in the interval between the 7-8 complex and lower cranial nerves. It was dissected free of the ipsilateral vertebral artery, PICA, and AICA with attention to protect small perforators.   The tumor was then just isolated to the 7-8 complex. Both nerves were completely encased in cyst wall circumferentially. The facial nerve was stimulating through the cyst wall circumferentially. I was able to remove a portion of cyst wall and some cystic contents to send for pathology. Attempts at dissecting the cyst wall free caused spontaneous facial potentials on EMG. Given that the capsule originally did not stimulate, I presumed that this was just a cystic schwannoma, but the remaining portion all stimulated like a facial neuroma. I therefore thought that further dissection was not worth the risk, given that his brainstem, cranial nerves, and vascular structures were otherwise dissected free.   Hemostasis was confirmed, the dura was reapproximated with sutures, mastoid air cells were waxed, and Adheris (Stryker) dural sealant was applied to the dura as well as the air cells. A titanium plate was placed over the cranial defect.  All instrument and sponge counts were correct, the incision was then closed in layers. The patient was then returned to anesthesia for emergence. No apparent complications at the completion of the procedure.   EBL:  155mL   DRAINS: none   SPECIMENS: Right CPA tumor   Judith Part, MD 10/07/19 10:12 AM

## 2019-10-07 NOTE — Brief Op Note (Signed)
10/07/2019  2:59 PM  PATIENT:  Casey Richard  64 y.o. male  PRE-OPERATIVE DIAGNOSIS:  Vestibular Schwannoma  POST-OPERATIVE DIAGNOSIS:  Vestibular Schwannoma  PROCEDURE:  Procedure(s): RIGHT CRANIOTOMY TUMOR EXCISION (Right)  SURGEON:  Surgeon(s) and Role:    * Judith Part, MD - Primary  PHYSICIAN ASSISTANT:   ANESTHESIA:   general  EBL:  400 mL   BLOOD ADMINISTERED:none  DRAINS: none   LOCAL MEDICATIONS USED:  LIDOCAINE   SPECIMEN:  Source of Specimen:  Right CPA tumor  DISPOSITION OF SPECIMEN:  PATHOLOGY  COUNTS:  YES  TOURNIQUET:  * No tourniquets in log *  DICTATION: .Note written in EPIC  PLAN OF CARE: Admit to inpatient   PATIENT DISPOSITION:  PACU - hemodynamically stable.   Delay start of Pharmacological VTE agent (>24hrs) due to surgical blood loss or risk of bleeding: yes

## 2019-10-07 NOTE — Progress Notes (Signed)
Tomi Bamberger from [employer] called and asked to follow up with patient before surgery.  Requested by Dr. Adele Barthel.

## 2019-10-07 NOTE — Transfer of Care (Signed)
Immediate Anesthesia Transfer of Care Note  Patient: Casey Richard  Procedure(s) Performed: RIGHT CRANIOTOMY TUMOR EXCISION (Right )  Patient Location: PACU  Anesthesia Type:General  Level of Consciousness: awake  Airway & Oxygen Therapy: Patient Spontanous Breathing and Patient connected to face mask oxygen  Post-op Assessment: Report given to RN and Post -op Vital signs reviewed and stable  Post vital signs: Reviewed  Last Vitals:  Vitals Value Taken Time  BP 155/93 10/07/19 1515  Temp    Pulse 89 10/07/19 1521  Resp 22 10/07/19 1521  SpO2 94 % 10/07/19 1521  Vitals shown include unvalidated device data.  Last Pain:  Vitals:   10/07/19 0941  TempSrc:   PainSc: 0-No pain         Complications: No apparent anesthesia complications. Pt moving extremities x4. Surgeon assessed pt at bedside in PACU. Pt reports + sensation to right face.

## 2019-10-07 NOTE — Progress Notes (Signed)
Transported to surgery after bath completed.  Report was called and was transported in bed.

## 2019-10-07 NOTE — Progress Notes (Signed)
Spoke with Dr. Christella Noa at bedside about patient having no diet orders or pain medication orders. New orders received. Will continue to monitor.

## 2019-10-08 ENCOUNTER — Inpatient Hospital Stay (HOSPITAL_COMMUNITY): Payer: Medicare Other

## 2019-10-08 LAB — CBC
HCT: 39.4 % (ref 39.0–52.0)
Hemoglobin: 13.1 g/dL (ref 13.0–17.0)
MCH: 32.7 pg (ref 26.0–34.0)
MCHC: 33.2 g/dL (ref 30.0–36.0)
MCV: 98.3 fL (ref 80.0–100.0)
Platelets: 168 10*3/uL (ref 150–400)
RBC: 4.01 MIL/uL — ABNORMAL LOW (ref 4.22–5.81)
RDW: 14.2 % (ref 11.5–15.5)
WBC: 23.9 10*3/uL — ABNORMAL HIGH (ref 4.0–10.5)
nRBC: 0 % (ref 0.0–0.2)

## 2019-10-08 LAB — GLUCOSE, CAPILLARY
Glucose-Capillary: 119 mg/dL — ABNORMAL HIGH (ref 70–99)
Glucose-Capillary: 139 mg/dL — ABNORMAL HIGH (ref 70–99)
Glucose-Capillary: 188 mg/dL — ABNORMAL HIGH (ref 70–99)
Glucose-Capillary: 100 mg/dL — ABNORMAL HIGH (ref 70–99)

## 2019-10-08 LAB — MAGNESIUM: Magnesium: 2.5 mg/dL — ABNORMAL HIGH (ref 1.7–2.4)

## 2019-10-08 MED ORDER — GADOBUTROL 1 MMOL/ML IV SOLN
6.0000 mL | Freq: Once | INTRAVENOUS | Status: AC | PRN
  Administered 2019-10-08: 6 mL via INTRAVENOUS

## 2019-10-08 MED ORDER — MENTHOL 3 MG MT LOZG
1.0000 | LOZENGE | OROMUCOSAL | Status: DC | PRN
  Administered 2019-10-08: 3 mg via ORAL
  Filled 2019-10-08: qty 9

## 2019-10-08 MED ORDER — HYDROMORPHONE HCL 1 MG/ML IJ SOLN
0.5000 mg | INTRAMUSCULAR | Status: DC | PRN
  Administered 2019-10-10: 0.5 mg via INTRAVENOUS
  Filled 2019-10-08: qty 0.5
  Filled 2019-10-08: qty 1

## 2019-10-08 MED ORDER — LORAZEPAM 2 MG/ML IJ SOLN
2.0000 mg | Freq: Once | INTRAMUSCULAR | Status: AC
  Administered 2019-10-08: 2 mg via INTRAVENOUS
  Filled 2019-10-08: qty 1

## 2019-10-08 NOTE — Progress Notes (Signed)
Patient arrived to the unit at [place] via wheel chair after report was received from [place].

## 2019-10-08 NOTE — Progress Notes (Signed)
Patient under Dr Zada Finders care, discussed with him 4/19.  Please notify [facility] if any assistance necessary. Otherwise patient appears to be medically improving as anticipated.   Gerlean Ren MD [facility]

## 2019-10-08 NOTE — Progress Notes (Signed)
Neurosurgery Service Progress Note  Subjective: No acute events overnight, facial numbness improved, memory improving   Objective: Vitals:   10/08/19 0500 10/08/19 0600 10/08/19 0700 10/08/19 0800  BP: 125/80 122/81 130/83   Pulse: 70 70 70   Resp: 15 (!) 9 10   Temp:    98.1 F (36.7 C)  TempSrc:      SpO2: 94% 96% 94%   Weight:      Height:       Temp (24hrs), Avg:97.8 F (36.6 C), Min:97.1 F (36.2 C), Max:98.2 F (36.8 C)  CBC Latest Ref Rng & Units 10/08/2019 10/07/2019 10/07/2019  WBC 4.0 - 10.5 K/uL 23.9(H) - -  Hemoglobin 13.0 - 17.0 g/dL 13.1 12.6(L) 13.9  Hematocrit 39.0 - 52.0 % 39.4 37.0(L) 41.0  Platelets 150 - 400 K/uL 168 - -   BMP Latest Ref Rng & Units 10/07/2019 10/07/2019 10/07/2019  Glucose 70 - 99 mg/dL 121(H) - 134(H)  BUN 8 - 23 mg/dL 30(H) - 35(H)  Creatinine 0.61 - 1.24 mg/dL 0.70 - 0.88  Sodium 135 - 145 mmol/L 137 133(L) 137  Potassium 3.5 - 5.1 mmol/L 4.7 4.7 4.7  Chloride 98 - 111 mmol/L 104 - 97(L)  CO2 22 - 32 mmol/L - - 30  Calcium 8.9 - 10.3 mg/dL - - 8.8(L)    Intake/Output Summary (Last 24 hours) at 10/08/2019 1112 Last data filed at 10/08/2019 0700 Gross per 24 hour  Intake 2295.56 ml  Output 4425 ml  Net -2129.44 ml    Current Facility-Administered Medications:  .  Chlorhexidine Gluconate Cloth 2 % PADS 6 each, 6 each, Topical, Daily, Judith Part, MD, 6 each at 10/07/19 1715 .  cholecalciferol (VITAMIN D3) tablet 4,000 Units, 4,000 Units, Oral, Daily, Judith Part, MD, 4,000 Units at 10/08/19 0900 .  feeding supplement (ENSURE ENLIVE) (ENSURE ENLIVE) liquid 237 mL, 237 mL, Oral, TID BM, Judith Part, MD, 237 mL at 10/08/19 0909 .  HYDROmorphone (DILAUDID) injection 0.5 mg, 0.5 mg, Intravenous, Q3H PRN, Sieanna Vanstone A, MD .  insulin aspart (novoLOG) injection 0-6 Units, 0-6 Units, Subcutaneous, TID WC, Saadia Dewitt, Joyice Faster, MD, Stopped at 10/07/19 1730 .  lactated ringers infusion, , Intravenous, Continuous,  Dandria Griego, Joyice Faster, MD, Last Rate: 10 mL/hr at 10/08/19 0700, Rate Verify at 10/08/19 0700 .  LORazepam (ATIVAN) injection 2 mg, 2 mg, Intravenous, Once, Nedim Oki A, MD .  melatonin tablet 3 mg, 3 mg, Oral, QHS, Chenay Nesmith, Joyice Faster, MD, 3 mg at 10/07/19 2206 .  menthol-cetylpyridinium (CEPACOL) lozenge 3 mg, 1 lozenge, Oral, PRN, Judith Part, MD .  multivitamin with minerals tablet 1 tablet, 1 tablet, Oral, Daily, Jonthan Leite, Joyice Faster, MD, 1 tablet at 10/08/19 0901 .  ondansetron (ZOFRAN) tablet 4 mg, 4 mg, Oral, Q6H PRN **OR** ondansetron (ZOFRAN) injection 4 mg, 4 mg, Intravenous, Q6H PRN, Eddy Liszewski A, MD .  oxyCODONE (Oxy IR/ROXICODONE) immediate release tablet 5-10 mg, 5-10 mg, Oral, Q4H PRN, Ashok Pall, MD, 5 mg at 10/08/19 0040 .  polyethylene glycol (MIRALAX / GLYCOLAX) packet 17 g, 17 g, Oral, Daily PRN, Judith Part, MD, 17 g at 10/02/19 0929 .  senna (SENOKOT) tablet 17.2 mg, 2 tablet, Oral, QHS, Khing Belcher A, MD, 17.2 mg at 10/07/19 2206 .  senna-docusate (Senokot-S) tablet 2 tablet, 2 tablet, Oral, QHS PRN, Judith Part, MD .  traMADol (ULTRAM) tablet 50 mg, 50 mg, Oral, Q6H PRN, Ashok Pall, MD, 50 mg at 10/07/19 2051 .  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  -MRI today, premedicate w/ lorazepam, has MR-compatible pacer -transfer to floor -PT/OT recs -final path pending -SCDs/TEDs, hold SQH until POD2  Judith Part  10/08/19 11:12 AM

## 2019-10-08 NOTE — Progress Notes (Signed)
Informed of MRI for today.   Device system confirmed to be MRI conditional, with implant date > 6 weeks ago and no evidence of abandoned or epicardial leads in review of most recent CXR Interrogation from today reviewed, pt is currently AS-VP at 73 bpm Change device settings for MRI to DOO at 90 bpm  Program device back to pre-MRI settings after completion of exam.  Shirley Friar, PA-C  10/08/2019 12:18 PM

## 2019-10-08 NOTE — Progress Notes (Addendum)
Occupational Therapy Re-evaluation Patient Details Name: Casey Richard MRN: [id number] DOB: [birth year] Today's Date: 10/08/2019    History of present illness 64 y/o male admitted secondary to dizziness, weight loss, and gait instability. 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  MRI confirmed 2 large brain masses. S/p right crani for tumor excision on 10/07/19. PMH includes CKD and complete heart block s/p pacemaker.   OT comments  Pt with s/o R craniotomy on 10/07/19. Pt presenting with decreased balance, attention to left, strength, and cognition. Additionally, pt with word finding difficulty but very inquisitive about his situation. Pt very motivated to participate in therapy and appreciative of therapy. Pt donning his socks at bed level with Mod A and Max cues; attempting to don both socks on R foot. Pt performing functional mobility with RW and requiring Mod-Max A for posterior and left lean. Pt will require further acute OT to facilitate safe dc. Recommend dc to CIR for intensive OT to optimize safety, independence with ADLs, and return to PLOF. Changed goals accordingly.   Follow Up Recommendations  CIR    Equipment Recommendations  Other (comment)(Defer to next venue)    Recommendations for Other Services      Precautions / Restrictions Precautions Precautions: Fall Restrictions Weight Bearing Restrictions: No       Mobility Bed Mobility Overal bed mobility: Needs Assistance Bed Mobility: Supine to Sit     Supine to sit: Min guard;HOB elevated     General bed mobility comments: Min Guard A for safety  Transfers Overall transfer level: Needs assistance Equipment used: None Transfers: Sit to/from Stand Sit to Stand: Mod assist         General transfer comment: Min A to power up into standing and then Mod A for correcting posterior lean    Balance Overall balance assessment: Needs  assistance Sitting-balance support: No upper extremity supported;Feet supported Sitting balance-Leahy Scale: Fair     Standing balance support: Bilateral upper extremity supported;During functional activity Standing balance-Leahy Scale: Poor Standing balance comment: Reliant on physical A due to posterior lean and left lean                           ADL either performed or assessed with clinical judgement   ADL Overall ADL's : Needs assistance/impaired                     Lower Body Dressing: Moderate assistance;Bed level Lower Body Dressing Details (indicate cue type and reason): Pt presenting with decreased problem solving and attention. Initating donning of right sock and pt then able to finish task. Providing pt with second sock and he attempted to don on right. Cues that he alsready at sock on R and he stated "that one is taking any more today." Agreed and cued for pt to don on left foot.  Toilet Transfer: Ambulation;Moderate assistance;Maximal assistance;RW(simulated to recliner) Toilet Transfer Details (indicate cue type and reason): Mod-Max A for dynamic balance pending his attention to task. heavy left lean         Functional mobility during ADLs: Rolling walker;Moderate assistance;Maximal assistance(heavy left lean) General ADL Comments: Pt able to ambulate to/from bedroom sink with RW and min assist for balance. Pt tolerated standing 1 x 10 min at the sink to complete grooming and hygiene tasks.      Vision   Additional Comments: Will need to further  assess. Noting minimal nystagmus   Perception     Praxis      Cognition Arousal/Alertness: Awake/alert Behavior During Therapy: WFL for tasks assessed/performed Overall Cognitive Status: Impaired/Different from baseline                                 General Comments: Poor attention to left requiring cues to maintain balanace and avoid veering to right. Pt with difficulty verbalizing  thoughts and selecting words that were close to his meaning but not quite the right words. Such as pt stating "these are pretty water logged, right" and point to his legs. Confirmed that pt felt his legs were heavy. Pt tangiental and asking many questions. Pleasant and very appreciative of therapy.         Exercises     Shoulder Instructions       General Comments VSS. RN present    Pertinent Vitals/ Pain       Pain Assessment: Faces Faces Pain Scale: No hurt Pain Intervention(s): Monitored during session  Home Living                                          Prior Functioning/Environment              Frequency  Min 3X/week        Progress Toward Goals  OT Goals(current goals can now be found in the care plan section)  Progress towards OT goals: Progressing toward goals  Acute Rehab OT Goals Patient Stated Goal: to go home Time For Goal Achievement: 10/16/19 Potential to Achieve Goals: Good ADL Goals Pt Will Perform Grooming: with supervision;standing Pt Will Perform Lower Body Bathing: with supervision;sit to/from stand Pt Will Perform Lower Body Dressing: with supervision;sit to/from stand Pt Will Transfer to Toilet: with supervision;ambulating Pt Will Perform Toileting - Clothing Manipulation and hygiene: with supervision;sit to/from stand Additional ADL Goal #1: Pt to recall and verbalize 3 fall prevention strategies with 0 verbal cues.  Plan Discharge plan remains appropriate    Co-evaluation                 AM-PAC OT "6 Clicks" Daily Activity     Outcome Measure   Help from another person eating meals?: A Little Help from another person taking care of personal grooming?: A Little Help from another person toileting, which includes using toliet, bedpan, or urinal?: A Lot Help from another person bathing (including washing, rinsing, drying)?: A Lot Help from another person to put on and taking off regular upper body clothing?: A  Little Help from another person to put on and taking off regular lower body clothing?: A Lot 6 Click Score: 15    End of Session Equipment Utilized During Treatment: Gait belt;Rolling walker  OT Visit Diagnosis: Unsteadiness on feet (R26.81);Repeated falls (R29.6);Muscle weakness (generalized) (M62.81)   Activity Tolerance Patient tolerated treatment well   Patient Left with call bell/phone within reach;in chair;with chair alarm set   Nurse Communication Mobility status        Time: [id number] OT Time Calculation (min): 28 min  Charges: OT General Charges $OT Visit: 1 Visit OT Evaluation $OT Re-eval: 1 Re-eval OT Treatments $Self Care/Home Management : 8-22 mins  [place], OTR/L Acute Rehab Pager: [phone] Office: [place] 10/08/2019, 5:48 PM

## 2019-10-08 NOTE — Progress Notes (Signed)
Pt returned from MRI °

## 2019-10-09 ENCOUNTER — Encounter: Payer: Self-pay | Admitting: *Deleted

## 2019-10-09 DIAGNOSIS — I4901 Ventricular fibrillation: Secondary | ICD-10-CM | POA: Diagnosis not present

## 2019-10-09 DIAGNOSIS — G9389 Other specified disorders of brain: Secondary | ICD-10-CM | POA: Diagnosis not present

## 2019-10-09 DIAGNOSIS — R739 Hyperglycemia, unspecified: Secondary | ICD-10-CM

## 2019-10-09 DIAGNOSIS — T380X5A Adverse effect of glucocorticoids and synthetic analogues, initial encounter: Secondary | ICD-10-CM

## 2019-10-09 DIAGNOSIS — D62 Acute posthemorrhagic anemia: Secondary | ICD-10-CM

## 2019-10-09 DIAGNOSIS — D72829 Elevated white blood cell count, unspecified: Secondary | ICD-10-CM | POA: Diagnosis not present

## 2019-10-09 LAB — CBC
HCT: 39.2 % (ref 39.0–52.0)
Hemoglobin: 12.9 g/dL — ABNORMAL LOW (ref 13.0–17.0)
MCH: 32.5 pg (ref 26.0–34.0)
MCHC: 32.9 g/dL (ref 30.0–36.0)
MCV: 98.7 fL (ref 80.0–100.0)
Platelets: 154 10*3/uL (ref 150–400)
RBC: 3.97 MIL/uL — ABNORMAL LOW (ref 4.22–5.81)
RDW: 14 % (ref 11.5–15.5)
WBC: 20.2 10*3/uL — ABNORMAL HIGH (ref 4.0–10.5)
nRBC: 0 % (ref 0.0–0.2)

## 2019-10-09 LAB — SURGICAL PATHOLOGY

## 2019-10-09 LAB — GLUCOSE, CAPILLARY
Glucose-Capillary: 129 mg/dL — ABNORMAL HIGH (ref 70–99)
Glucose-Capillary: 100 mg/dL — ABNORMAL HIGH (ref 70–99)
Glucose-Capillary: 110 mg/dL — ABNORMAL HIGH (ref 70–99)
Glucose-Capillary: 93 mg/dL (ref 70–99)
Glucose-Capillary: 97 mg/dL (ref 70–99)

## 2019-10-09 LAB — MAGNESIUM: Magnesium: 2.2 mg/dL (ref 1.7–2.4)

## 2019-10-09 MED ORDER — HEPARIN SODIUM (PORCINE) 5000 UNIT/ML IJ SOLN
5000.0000 [IU] | Freq: Three times a day (TID) | INTRAMUSCULAR | Status: DC
  Administered 2019-10-09 – 2019-10-10 (×5): 5000 [IU] via SUBCUTANEOUS
  Filled 2019-10-09 (×5): qty 1

## 2019-10-09 NOTE — Consult Note (Signed)
Physical Medicine and Rehabilitation Consult   Reason for Consult: Functional decline.  Referring Physician: Dr. Joaquim Nam.   HPI: Casey Richard is a 64 y.o. male with history of CKD, cardiac arrest/NSVT/CHF s/p PPM, STM deficits who was admitted on 09/27/19 with few month history of progressive dizziness, falls, blurred vision and memory decline. History taken from chart review due to mentation. He reported headaches with neck pain for 1 week after striking his head due to recurrent falls. CT head showed right cerebellopontine mass..  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 Dr. Marcello Moores consulted for input and patient also reported associated severe hearing loss in right ear as well as visual disturbance. He was started on steroids and once PPM turned off. 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 He underwent right retrosigmoid craniotomy for tumor resection on 10/07/19.  Repeat MRI brain done on 10/08/19 revealing punctate infarct in left parietal lobe, multifocal pneumocephalus and decrease in mass effect on brain stem s/p bulking of tumor--now 3.0x 2.4 cm. Therapy ongoing, hospital further complicated by decrease attention to the left with word finding deficits, posterior and left lean as well as difficulty processing with ADL tasks.    CIR recommended due to functional deficits.     Review of Systems  Unable to perform ROS: Mental acuity   Past Medical History:  Diagnosis Date  . Cardiac arrest ([place])   . CHB (complete heart block) ([place])   . Chronic kidney disease    uretheral  stricture/  has suprapubic  at present  . Dysrhythmia 1/13   bradycardia, VF, cardiac arrest/complete heart block with pacer inserted/LOV Dr Sallyanne Kuster 07/21/11 with interrogation and anesthesia guideline order on chart. Chest x ray 1/13 EPIC,, TEE and [facility]  1/13 EPIC  . Memory loss, short term    from fall with heart block 1/13  . Nonsustained ventricular tachycardia ([place]) 02/07/2014  . Presence of permanent cardiac pacemaker 07/15/2011   Medtronic Revo  . Seasonal allergies   . Shortness of breath     Past Surgical History:  Procedure Laterality Date  . CARDIAC CATHETERIZATION  07/09/2011   Normal coronaries  . CYSTOSCOPY WITH URETHRAL DILATATION  08/19/2011   Procedure: CYSTOSCOPY WITH URETHRAL DILATATION;  Surgeon: Dutch Gray, MD;  Location: [facility] ORS;  Service: Urology;  Laterality: N/A;  Balloon Dilation of Urethral Stricture, retro-urethrogram, Suprapubic change   . LEFT HEART CATHETERIZATION WITH CORONARY ANGIOGRAM N/A 07/09/2011   Procedure: LEFT HEART CATHETERIZATION WITH CORONARY ANGIOGRAM;  Surgeon: Jettie Booze, MD;  Location: [facility] CATH LAB;  Service: Cardiovascular;  Laterality: N/A;  . PACEMAKER INSERTION  07/15/11   Medtronic Revo  . PERMANENT PACEMAKER INSERTION Left 07/15/2011   Procedure: PERMANENT PACEMAKER INSERTION;  Surgeon: Sanda Klein, MD;  Location: [facility] CATH LAB;  Service: Cardiovascular;  Laterality: Left;  . TEMPORARY PACEMAKER INSERTION Right 07/09/2011   Procedure: TEMPORARY PACEMAKER INSERTION;  Surgeon: Jettie Booze, MD;  Location: [facility] CATH LAB;  Service: Cardiovascular;  Laterality: Right;    Family History  Problem Relation Age of Onset  . Coronary artery disease Mother        AICD  . COPD  Father   . Coronary artery disease Brother    Social History:  Lives with his three autistic children--family and neighbor caring for them currently. Was a builder--disabled due to cardiac issues. He reports that he quit smoking about 30 years ago. His smoking  use included cigarettes. He has never used smokeless tobacco. He reports that he does not drink alcohol or use drugs.   Allergies: No Known Allergies   Medications Prior to Admission  Medication Sig Dispense Refill  . acetaminophen (TYLENOL) 500 MG tablet Take 1,000 mg by mouth every 6 (six) hours as needed. Pain     . Cholecalciferol (HM VITAMIN D3) 4000 units CAPS Take 1 capsule by mouth daily.    . cyanocobalamin 2000 MCG tablet Take 2,000 mcg by mouth daily.    . Multiple Vitamin (MULITIVITAMIN WITH MINERALS) TABS Take 1 tablet by mouth daily.       Home: Home Living Family/patient expects to be discharged to:: Private residence Living Arrangements: Children Available Help at Discharge: Family Type of Home: House Home Access: Stairs to enter Technical brewer of Steps: 1 Entrance Stairs-Rails: None Home Layout: One level Bathroom Shower/Tub: Tub/shower unit [place] - single point Additional Comments: 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   Functional History: Prior Function Level of Independence: Independent with assistive device(s) Comments: Pt independent in ADLs and mobility. Pt reports sharing IADL responsibilities with sons. Pt reports using a cane the past few weeks due to increasing instability. Pt reports several falls in the last 6 months.  Functional Status:  Mobility: Bed Mobility Overal bed mobility: Needs Assistance Bed Mobility: Supine to Sit Supine to sit: Min guard, HOB elevated Sit to supine: Supervision General bed mobility comments: Min Guard A for safety Transfers Overall transfer level: Needs assistance Equipment used: None Transfers: Sit to/from Stand Sit to Stand: Mod assist General transfer comment: Min A to power up into standing and then Mod A for correcting posterior lean Ambulation/Gait Ambulation/Gait assistance: Min assist, Min guard Gait Distance (Feet): 400  Feet Assistive device: 1 person hand held assist(some use of rail in hall, minA HHA when not using rail) Gait Pattern/deviations: Narrow base of support, Step-through pattern, Staggering left, Drifts right/left General Gait Details: pt initially ambulating with minG assist and use of rail in hallway, given HHA and ased not to use rail, pt able to maintain ambulation with minA HHA with no LOB. Small gait deviations with head turns, slowed but steady with weaving through hallway, no LOB throughout Gait velocity: decreased Gait velocity interpretation: 1.31 - 2.62 ft/sec, indicative of limited community ambulator    ADL: ADL Overall ADL's : Needs assistance/impaired Eating/Feeding: Independent, Sitting Grooming: Wash/dry hands, Wash/dry face, Oral care, Applying deodorant, Brushing hair, Standing, Min guard Grooming Details (indicate cue type and reason): While standing at the sink Upper Body Bathing: Set up, Supervision/ safety, Sitting Lower Body Bathing: Min guard, Sit to/from stand Upper Body Dressing : Set up, Supervision/safety, Sitting Lower Body Dressing: Moderate assistance, Bed level Lower Body Dressing Details (indicate cue type and reason): Pt presenting with decreased problem solving and attention. Initating donning of right sock and pt then able to finish task. Providing pt with second sock and he attempted to don on right. Cues that he alsready at sock on R and he stated "that one is taking any more today." Agreed and cued for pt to don on left foot.  Toilet Transfer: Ambulation, Moderate assistance, Maximal assistance, RW(simulated  to recliner) Toilet Transfer Details (indicate cue type and reason): Mod-Max A for dynamic balance pending his attention to task. heavy left lean Toileting- Clothing Manipulation and Hygiene: Minimal assistance, Sit to/from stand Functional mobility during ADLs: Rolling walker, Moderate assistance, Maximal assistance(heavy left lean) General ADL  Comments: Pt able to ambulate to/from bedroom sink with RW and min assist for balance. Pt tolerated standing 1 x 10 min at the sink to complete grooming and hygiene tasks.   Cognition: Cognition Overall Cognitive Status: Impaired/Different from baseline Orientation Level: Oriented X4 Cognition Arousal/Alertness: Awake/alert Behavior During Therapy: WFL for tasks assessed/performed Overall Cognitive Status: Impaired/Different from baseline General Comments: Poor attention to left requiring cues to maintain balanace and avoid veering to right. Pt with difficulty verbalizing thoughts and selecting words that were close to his meaning but not quite the right words. Such as pt stating "these are pretty water logged, right" and point to his legs. Confirmed that pt felt his legs were heavy. Pt tangiental and asking many questions. Pleasant and very appreciative of therapy.    Blood pressure 114/67, pulse 84, temperature 98.3 F (36.8 C), temperature source Oral, resp. rate 18, height 5\' 10"  (1.778 m), weight 68 kg, SpO2 95 %. Physical Exam  Nursing note and vitals reviewed. Constitutional: He appears well-developed. He appears lethargic.  Frail  HENT:  Right Ear: External ear normal.  Left Ear: External ear normal.  Left forehead with resolving hematoma?  Eyes: EOM are normal. Right eye exhibits no discharge. Left eye exhibits no discharge.  Neck: No tracheal deviation present. No thyromegaly present.  Respiratory: Effort normal. No respiratory distress.  GI: Soft. He exhibits no distension.  Musculoskeletal:     Comments: No edema or tenderness in extremities  Neurological: He appears lethargic.  HOH Sedated due to meds? And had difficulty keeping eyes open.  He was able to answer orientation questions, state DOB and knew it was April.   Exteremly delayed process with slow speech and decreased verbal output.  Motor: Generalized weakness, 4-/5 throughout  Skin: Skin is warm and dry.  See  above  Psychiatric: His affect is blunt. His speech is delayed. He is slowed.    Results for orders placed or performed during the hospital encounter of 09/27/19 (from the past 24 hour(s))  Glucose, capillary     Status: Abnormal   Collection Time: 10/08/19 11:28 AM  Result Value Ref Range   Glucose-Capillary 139 (H) 70 - 99 mg/dL   Comment 1 Notify RN    Comment 2 Document in Chart   Glucose, capillary     Status: Abnormal   Collection Time: 10/08/19  4:53 PM  Result Value Ref Range   Glucose-Capillary 188 (H) 70 - 99 mg/dL   Comment 1 Notify RN    Comment 2 Document in Chart   Glucose, capillary     Status: Abnormal   Collection Time: 10/08/19  9:47 PM  Result Value Ref Range   Glucose-Capillary 100 (H) 70 - 99 mg/dL  CBC     Status: Abnormal   Collection Time: 10/09/19  5:01 AM  Result Value Ref Range   WBC 20.2 (H) 4.0 - 10.5 K/uL   RBC 3.97 (L) 4.22 - 5.81 MIL/uL   Hemoglobin 12.9 (L) 13.0 - 17.0 g/dL   HCT 39.2 39.0 - 52.0 %   MCV 98.7 80.0 - 100.0 fL   MCH 32.5 26.0 - 34.0 pg   MCHC 32.9 30.0 - 36.0 g/dL   RDW 14.0 11.5 -  15.5 %   Platelets 154 150 - 400 K/uL   nRBC 0.0 0.0 - 0.2 %  Magnesium     Status: None   Collection Time: 10/09/19  5:01 AM  Result Value Ref Range   Magnesium 2.2 1.7 - 2.4 mg/dL  Glucose, capillary     Status: Abnormal   Collection Time: 10/09/19  6:20 AM  Result Value Ref Range   Glucose-Capillary 129 (H) 70 - 99 mg/dL   Comment 1 Notify RN    Comment 2 Document in Chart    MR BRAIN W WO CONTRAST  Result Date: 10/08/2019 CLINICAL DATA:  Status post craniotomy for tumor resection. EXAM: MRI HEAD WITHOUT AND WITH CONTRAST TECHNIQUE: Multiplanar, multiecho pulse sequences of the brain and surrounding structures were obtained without and with intravenous contrast. CONTRAST:  63mL GADAVIST GADOBUTROL 1 MMOL/ML IV SOLN COMPARISON:  Brain MRI 09/30/2019 FINDINGS: Brain: Punctate focus of abnormal diffusion restriction within the left parietal lobe.  Multifocal pneumocephalus. Status post debulking of right cerebellopontine angle tumor, which now measures 3.0 x 2.4 cm, previously 4.3 x 3.5 cm. There is persistent extension into the right internal auditory canal. Mass effect on the brainstem has improved. The fourth ventricle is more patent. Normal white matter signal. Normal volume of CSF spaces. No chronic microhemorrhage. Vascular: Normal flow voids. Skull and upper cervical spine: Right retromastoid craniotomy. Sinuses/Orbits: Negative. Other: None. IMPRESSION: 1. Punctate focus of abnormal diffusion restriction within the left parietal lobe, likely a small acute infarct. 2. Status post debulking of right cerebellopontine angle tumor, with decreased mass effect on the brainstem. Electronically Signed   By: Ulyses Jarred M.D.   On: 10/08/2019 15:47    Assessment/Plan: Diagnosis: Punctate infarct in left parietal lobe, cystic mass s/p debulking Labs independently reviewed.  Records reviewed and summated above.  1. Does the need for close, 24 hr/day medical supervision in concert with the patient's rehab needs make it unreasonable for this patient to be served in a less intensive setting? Potentially 2. Co-Morbidities requiring supervision/potential complications: ?CKD (repeat labs, avoid nephrotoxic meds), cardiac arrest/NSVT/CHF s/p PPM, STM deficits, steroid induced hyperglycemia (Monitor in accordance with exercise and adjust meds as necessary), leukocytosis (repeat labs, cont to monitor for signs and symptoms of infection, further workup if indicated), ABLA (repeat labs, consider transfusion if necessary to ensure appropriate perfusion for increased activity tolerance) 3. Due to safety, skin/wound care, disease management, medication administration, pain management and patient education, does the patient require 24 hr/day rehab nursing? Yes 4. Does the patient require coordinated care of a physician, rehab nurse, therapy disciplines of PT/OT/SLP  to address physical and functional deficits in the context of the above medical diagnosis(es)? Potentially Addressing deficits in the following areas: balance, endurance, locomotion, strength, transferring, bathing, toileting, cognition, speech and psychosocial support 5. Can the patient actively participate in an intensive therapy program of at least 3 hrs of therapy per day at least 5 days per week? Yes 6. The potential for patient to make measurable gains while on inpatient rehab is good 7. Anticipated functional outcomes upon discharge from inpatient rehab are modified independent and supervision  with PT, modified independent and supervision with OT, modified independent with SLP. 8. Estimated rehab length of stay to reach the above functional goals is: 4-6 days. 9. Anticipated discharge destination: Home 10. Overall Rehab/Functional Prognosis: good  RECOMMENDATIONS: This patient's condition is appropriate for continued rehabilitative care in the following setting: Patient last seen by PT on 4/17, ambulating min guard 400 feet.  Recommend repeat eval by PT, if functional decline would recommend CIR, otherwise recommend home with [facility].  Further, pt appears hesistant about staying in the hospital longer due to child care.   Patient has agreed to participate in recommended program. Potentially Note that insurance prior authorization may be required for reimbursement for recommended care.  Comment: Rehab Admissions Coordinator to follow up.  I have personally performed a face to face diagnostic evaluation, including, but not limited to relevant history and physical exam findings, of this patient and developed relevant assessment and plan.  Additionally, I have reviewed and concur with the physician assistant's documentation above.   Delice Lesch, MD, ABPMR Bary Leriche, PA-C 10/09/2019

## 2019-10-09 NOTE — Progress Notes (Signed)
Neurosurgery Service Progress Note  Subjective: No acute events overnight, diplopia starting to bother him, otherwise no complaints  Objective: Vitals:   10/08/19 2019 10/09/19 0049 10/09/19 0345 10/09/19 0850  BP: 120/80 108/68 114/67 123/75  Pulse: 85 77 84 84  Resp: 18 18 18 18   Temp: (!) 97.3 F (36.3 C) 98.2 F (36.8 C) 98.3 F (36.8 C) 98.4 F (36.9 C)  TempSrc: Oral Oral Oral Oral  SpO2: 96% 98% 95% 96%  Weight:      Height:       Temp (24hrs), Avg:98 F (36.7 C), Min:97.3 F (36.3 C), Max:98.4 F (36.9 C)  CBC Latest Ref Rng & Units 10/09/2019 10/08/2019 10/07/2019  WBC 4.0 - 10.5 K/uL 20.2(H) 23.9(H) -  Hemoglobin 13.0 - 17.0 g/dL 12.9(L) 13.1 12.6(L)  Hematocrit 39.0 - 52.0 % 39.2 39.4 37.0(L)  Platelets 150 - 400 K/uL 154 168 -   BMP Latest Ref Rng & Units 10/07/2019 10/07/2019 10/07/2019  Glucose 70 - 99 mg/dL 121(H) - 134(H)  BUN 8 - 23 mg/dL 30(H) - 35(H)  Creatinine 0.61 - 1.24 mg/dL 0.70 - 0.88  Sodium 135 - 145 mmol/L 137 133(L) 137  Potassium 3.5 - 5.1 mmol/L 4.7 4.7 4.7  Chloride 98 - 111 mmol/L 104 - 97(L)  CO2 22 - 32 mmol/L - - 30  Calcium 8.9 - 10.3 mg/dL - - 8.8(L)    Intake/Output Summary (Last 24 hours) at 10/09/2019 0851 Last data filed at 10/08/2019 2300 Gross per 24 hour  Intake 1117.21 ml  Output 850 ml  Net 267.21 ml    Current Facility-Administered Medications:  .  Chlorhexidine Gluconate Cloth 2 % PADS 6 each, 6 each, Topical, Daily, Judith Part, MD, 6 each at 10/08/19 1809 .  cholecalciferol (VITAMIN D3) tablet 4,000 Units, 4,000 Units, Oral, Daily, Judith Part, MD, 4,000 Units at 10/08/19 0900 .  feeding supplement (ENSURE ENLIVE) (ENSURE ENLIVE) liquid 237 mL, 237 mL, Oral, TID BM, Judith Part, MD, 237 mL at 10/08/19 2258 .  HYDROmorphone (DILAUDID) injection 0.5 mg, 0.5 mg, Intravenous, Q3H PRN, Matthewjames Petrasek A, MD .  insulin aspart (novoLOG) injection 0-6 Units, 0-6 Units, Subcutaneous, TID WC,  Tavarious Freel, Joyice Faster, MD, 1 Units at 10/08/19 1809 .  lactated ringers infusion, , Intravenous, Continuous, Marqui Formby, Joyice Faster, MD, Stopped at 10/08/19 1143 .  melatonin tablet 3 mg, 3 mg, Oral, QHS, Reyhan Moronta, Joyice Faster, MD, 3 mg at 10/08/19 2258 .  menthol-cetylpyridinium (CEPACOL) lozenge 3 mg, 1 lozenge, Oral, PRN, Judith Part, MD, 3 mg at 10/08/19 1153 .  multivitamin with minerals tablet 1 tablet, 1 tablet, Oral, Daily, Judith Part, MD, 1 tablet at 10/08/19 0901 .  ondansetron (ZOFRAN) tablet 4 mg, 4 mg, Oral, Q6H PRN **OR** ondansetron (ZOFRAN) injection 4 mg, 4 mg, Intravenous, Q6H PRN, Judith Part, MD, 4 mg at 10/08/19 1140 .  oxyCODONE (Oxy IR/ROXICODONE) immediate release tablet 5-10 mg, 5-10 mg, Oral, Q4H PRN, Ashok Pall, MD, 5 mg at 10/08/19 1140 .  polyethylene glycol (MIRALAX / GLYCOLAX) packet 17 g, 17 g, Oral, Daily PRN, Judith Part, MD, 17 g at 10/02/19 0929 .  senna (SENOKOT) tablet 17.2 mg, 2 tablet, Oral, QHS, Lillyan Hitson A, MD, 17.2 mg at 10/08/19 2258 .  senna-docusate (Senokot-S) tablet 2 tablet, 2 tablet, Oral, QHS PRN, Judith Part, MD .  traMADol (ULTRAM) tablet 50 mg, 50 mg, Oral, Q6H PRN, Ashok Pall, MD, 50 mg at 10/07/19 2051 .  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 MRI with good debulking of tumor, pre-post op measurements show decrease in tumor volume from 19.7cc-->4.3cc but these images use large slice thickness on [id number] and visually it does not appear that dramatic.  -PT/OT recs CIR, PM&R consulted -final path pending -SCDs/TEDs, start [place]  10/09/19 8:51 AM

## 2019-10-09 NOTE — Progress Notes (Signed)
Physical Therapy Treatment Patient Details Name: Casey Richard MRN: [id number] DOB: 03-Jan-1956 Today's Date: 10/09/2019    History of Present Illness 64 y/o male admitted secondary to dizziness, weight loss, and gait instability. 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  MRI confirmed 2 large brain masses. S/p right crani for tumor excision on 10/07/19. PMH includes CKD and complete heart block s/p pacemaker.    PT Comments    Patient in bed upon arrival and very drowsy but agreeable to participate in therapy. Pt presents with impaired balance, cognition, and visual deficits. Pt reports diplopia and often keeping eyes closed during session. Pt requires assistance for OOB mobility and AD used for gait training for decreased risk of falls.  Recommend CIR for further skilled PT services to maximize independence and safety with mobility.    Follow Up Recommendations  CIR     Equipment Recommendations  Other (comment)(TBD pending progress)    Recommendations for Other Services       Precautions / Restrictions Precautions Precautions: Fall Restrictions Weight Bearing Restrictions: No    Mobility  Bed Mobility Overal bed mobility: Needs Assistance Bed Mobility: Supine to Sit     Supine to sit: Min guard;HOB elevated     General bed mobility comments: Min Guard A for safety; reports feeling dizzy upon sitting   Transfers Overall transfer level: Needs assistance Equipment used: 1 person hand held assist Transfers: Sit to/from Stand Sit to Stand: Mod assist         General transfer comment: assist to power up from EOB and commode using grab bar; assist to gain balance upon standing   Ambulation/Gait Ambulation/Gait assistance: Min assist;+2 safety/equipment;Mod assist(chair follow) Gait Distance (Feet): 75 Feet Assistive device: Rolling walker (2 wheeled);1 person hand held assist Gait  Pattern/deviations: Step-through pattern;Decreased stride length;Trunk flexed Gait velocity: decreased   General Gait Details: initially ambulating with HHA and mod A to maintain balance then RW used rest of session; pt often closing eyes due to diplopia; pt with difficulty following cues to close one eye while mobilizing in attempt to decrease visual deficits; pt requires cues to continue ambulating due to poor attention    Stairs             Wheelchair Mobility    Modified Rankin (Stroke Patients Only)       Balance Overall balance assessment: Needs assistance Sitting-balance support: No upper extremity supported;Feet supported Sitting balance-Leahy Scale: Fair     Standing balance support: Bilateral upper extremity supported;During functional activity Standing balance-Leahy Scale: Poor                              Cognition Arousal/Alertness: Awake/alert Behavior During Therapy: WFL for tasks assessed/performed Overall Cognitive Status: Impaired/Different from baseline Area of Impairment: Attention;Memory;Following commands;Awareness;Problem solving                   Current Attention Level: Sustained Memory: Decreased short-term memory Following Commands: Follows one step commands with increased time   Awareness: Intellectual Problem Solving: Slow processing;Decreased initiation;Difficulty sequencing;Requires verbal cues General Comments: pt following simple commands with increased time and requires cues to maintain attention to tasks; pt often stopping mobility and asking "what was I doing?"       Exercises      General Comments        Pertinent Vitals/Pain Pain Assessment: Faces Faces Pain  Scale: No hurt    Home Living                      Prior Function            PT Goals (current goals can now be found in the care plan section) Progress towards PT goals: Progressing toward goals    Frequency    Min  3X/week      PT Plan Discharge plan needs to be updated    Co-evaluation              AM-PAC PT "6 Clicks" Mobility   Outcome Measure  Help needed turning from your back to your side while in a flat bed without using bedrails?: A Little Help needed moving from lying on your back to sitting on the side of a flat bed without using bedrails?: A Little Help needed moving to and from a bed to a chair (including a wheelchair)?: A Lot Help needed standing up from a chair using your arms (e.g., wheelchair or bedside chair)?: A Lot Help needed to walk in hospital room?: A Lot Help needed climbing 3-5 steps with a railing? : A Lot 6 Click Score: 14    End of Session Equipment Utilized During Treatment: Gait belt Activity Tolerance: Patient tolerated treatment well Patient left: in chair;with call bell/phone within reach;with chair alarm set Nurse Communication: Mobility status PT Visit Diagnosis: Unsteadiness on feet (R26.81);Muscle weakness (generalized) (M62.81);History of falling (Z91.81)     Time: [id number] PT Time Calculation (min) (ACUTE ONLY): 43 min  Charges:  $Gait Training: 23-37 mins $Therapeutic Activity: 8-22 mins                     Earney Navy, PTA Acute Rehabilitation Services Pager: [phone] Office: [phone]     Darliss Cheney 10/09/2019, 2:15 PM

## 2019-10-09 NOTE — Progress Notes (Signed)
Nutrition Follow-up  DOCUMENTATION CODES:   Severe malnutrition in context of chronic illness  INTERVENTION:  Continue Ensure Enlive po TID, each supplement provides 350 kcal and 20 grams of protein  Continue Magic cup TID with meals, each supplement provides 290 kcal and 9 grams of protein  Continue MVI daily  NUTRITION DIAGNOSIS:   Severe Malnutrition related to chronic illness as evidenced by severe muscle depletion, severe fat depletion, percent weight loss, energy intake < or equal to 75% for > or equal to 1 month.  Ongoing.  GOAL:   Patient will meet greater than or equal to 90% of their needs  Progressing.  MONITOR:   PO intake, Supplement acceptance, Weight trends, Labs  REASON FOR ASSESSMENT:   Consult Assessment of nutrition requirement/status  ASSESSMENT:   Pt with a PMH significant for complete heart block s/p PPM who presented to the hospital for worsening dizziness, gait instability, and weight loss. MRI revealed 2 large brain masses.  4/19 s/p RIGHT CRANIOTOMY TUMOR EXCISION  Pt lethargic during RD assessment; it was difficult for him to follow conversation. Pt did report enjoying the supplements he is receiving.   During previous RD assessment, pt reported his appetite had been declining over the last year due to a loss of taste. Pt stated he had been primarily eating canned soups with the occasional Boost protein supplement.  Limited weight history available in chart from the last year. Pt noted to have weighed 63.8 kg (140.36 lbs) on 09/05/19 and 68 kg (149.6 lbs) on 09/16/19. Outside of those weights, last available weight is from 02/14/2018 when pt weighed 73.5 kg (161.7 lbs). This would indicate a 7-13% wt loss x19 months, which is not significant for time frame. Per pt, he has experienced a 40 lbs wt loss over the last year (from 180 lbs to 140 lbs). This would indicate a 22% wt loss x1 year, which would be significant for time frame.   PO Intake:  50-100% x last 7 recorded meals (75% average intake)  UOP: 1,240ml x24 hours I/O: -12,555.82ml since admit  Labs reviewed: Ionized Ca 1.12 (L, 10/07/19) CBGs 129-100-110  Medications reviewed and include: Vitamin D3, Ensure Enlive TID, Novolog, MVI, Senokot, Vitamin B12   NUTRITION - FOCUSED PHYSICAL EXAM:    Most Recent Value  Orbital Region  Mild depletion  Upper Arm Region  Severe depletion  Thoracic and Lumbar Region  Severe depletion  Buccal Region  Moderate depletion  Temple Region  Moderate depletion  Clavicle Bone Region  Moderate depletion  Clavicle and Acromion Bone Region  Moderate depletion  Scapular Bone Region  Moderate depletion  Dorsal Hand  Mild depletion  Patellar Region  Moderate depletion  Anterior Thigh Region  Severe depletion  Posterior Calf Region  Severe depletion  Edema (RD Assessment)  None  Hair  Reviewed  Eyes  Reviewed  Mouth  Reviewed  Skin  Reviewed  Nails  Reviewed       Diet Order:   Diet Order            Diet regular Room service appropriate? Yes; Fluid consistency: Thin  Diet effective now              EDUCATION NEEDS:   Not appropriate for education at this time  Skin:  Skin Assessment: Skin Integrity Issues: Skin Integrity Issues:: Incisions Incisions: head  Last BM:  10/08/19  Height:   Ht Readings from Last 1 Encounters:  10/07/19 5\' 10"  (1.778 m)    Weight:  Wt Readings from Last 1 Encounters:  10/07/19 68 kg    BMI:  Body mass index is 21.52 kg/m.  Estimated Nutritional Needs:   Kcal:  2050-2250  Protein:  100-115 grams  Fluid:  >2L/d    Larkin Ina, MS, RD, LDN RD pager number and weekend/on-call pager number located in [place].

## 2019-10-09 NOTE — Progress Notes (Signed)
Inpatient Rehab Admissions:  Inpatient Rehab Consult received.  I met with patient at the bedside for rehabilitation assessment and to discuss goals and expectations of an inpatient rehab admission.  He was lethargic, difficult for him to follow conversation.  He did not give a definitive answer regarding interest in CIR, but did say that I could speak with his brother.  Will f/u with pt tomorrow.   Signed: Shann Medal, PT, DPT Admissions Coordinator [phone] 10/09/19  2:58 PM

## 2019-10-10 ENCOUNTER — Inpatient Hospital Stay (HOSPITAL_COMMUNITY): Payer: Medicare Other | Admitting: Anesthesiology

## 2019-10-10 ENCOUNTER — Encounter (HOSPITAL_COMMUNITY)
Admission: EM | Disposition: A | Discharge status: Skilled Nursing Facility | Payer: Self-pay | Source: Home / Self Care | Attending: Neurological Surgery

## 2019-10-10 ENCOUNTER — Encounter (HOSPITAL_COMMUNITY): Payer: Self-pay | Admitting: Neurological Surgery

## 2019-10-10 ENCOUNTER — Inpatient Hospital Stay (HOSPITAL_COMMUNITY): Payer: Medicare Other

## 2019-10-10 DIAGNOSIS — E43 Unspecified severe protein-calorie malnutrition: Secondary | ICD-10-CM | POA: Insufficient documentation

## 2019-10-10 HISTORY — PX: CRANIOTOMY: SHX93

## 2019-10-10 LAB — COMPREHENSIVE METABOLIC PANEL
ALT: 99 U/L — ABNORMAL HIGH (ref 0–44)
AST: 23 U/L (ref 15–41)
Albumin: 3.4 g/dL — ABNORMAL LOW (ref 3.5–5.0)
Alkaline Phosphatase: 52 U/L (ref 38–126)
Anion gap: 7 (ref 5–15)
BUN: 32 mg/dL — ABNORMAL HIGH (ref 8–23)
CO2: 32 mmol/L (ref 22–32)
Calcium: 8.6 mg/dL — ABNORMAL LOW (ref 8.9–10.3)
Chloride: 97 mmol/L — ABNORMAL LOW (ref 98–111)
Creatinine, Ser: 0.7 mg/dL (ref 0.61–1.24)
GFR calc Af Amer: >60 mL/min (ref >60)
GFR calc non Af Amer: >60 mL/min (ref >60)
Glucose, Bld: 125 mg/dL — ABNORMAL HIGH (ref 70–99)
Potassium: 4.2 mmol/L (ref 3.5–5.1)
Sodium: 136 mmol/L (ref 135–145)
Total Bilirubin: 1 mg/dL (ref 0.3–1.2)
Total Protein: 6.3 g/dL — ABNORMAL LOW (ref 6.5–8.1)

## 2019-10-10 LAB — CBC
HCT: 42 % (ref 39.0–52.0)
Hemoglobin: 13.7 g/dL (ref 13.0–17.0)
MCH: 32.1 pg (ref 26.0–34.0)
MCHC: 32.6 g/dL (ref 30.0–36.0)
MCV: 98.4 fL (ref 80.0–100.0)
Platelets: 176 10*3/uL (ref 150–400)
RBC: 4.27 MIL/uL (ref 4.22–5.81)
RDW: 14 % (ref 11.5–15.5)
WBC: 19.7 10*3/uL — ABNORMAL HIGH (ref 4.0–10.5)
nRBC: 0 % (ref 0.0–0.2)

## 2019-10-10 LAB — GLUCOSE, CAPILLARY
Glucose-Capillary: 116 mg/dL — ABNORMAL HIGH (ref 70–99)
Glucose-Capillary: 99 mg/dL (ref 70–99)

## 2019-10-10 LAB — AMMONIA: Ammonia: 24 umol/L (ref 9–35)

## 2019-10-10 LAB — MAGNESIUM: Magnesium: 2.2 mg/dL (ref 1.7–2.4)

## 2019-10-10 SURGERY — CRANIOTOMY TUMOR EXCISION
Anesthesia: General | Site: Head | Laterality: Right

## 2019-10-10 MED ORDER — PROMETHAZINE HCL 25 MG PO TABS: 12.5000 mg | ORAL_TABLET | ORAL | Status: DC | PRN

## 2019-10-10 MED ORDER — ROCURONIUM BROMIDE 10 MG/ML (PF) SYRINGE
PREFILLED_SYRINGE | INTRAVENOUS | Status: DC | PRN
  Administered 2019-10-10: 50 mg via INTRAVENOUS

## 2019-10-10 MED ORDER — ONDANSETRON HCL 4 MG PO TABS: 4.0000 mg | ORAL_TABLET | ORAL | Status: DC | PRN

## 2019-10-10 MED ORDER — DEXAMETHASONE SODIUM PHOSPHATE 10 MG/ML IJ SOLN
INTRAMUSCULAR | Status: DC | PRN
  Administered 2019-10-10: 10 mg via INTRAVENOUS

## 2019-10-10 MED ORDER — HYPROMELLOSE (GONIOSCOPIC) 2.5 % OP SOLN
1.0000 [drp] | OPHTHALMIC | Status: DC
  Administered 2019-10-11 – 2019-10-18 (×85): 1 [drp] via OPHTHALMIC
  Filled 2019-10-10: qty 15

## 2019-10-10 MED ORDER — PROPOFOL 500 MG/50ML IV EMUL
INTRAVENOUS | Status: DC | PRN
  Administered 2019-10-10: 25 ug/kg/min via INTRAVENOUS

## 2019-10-10 MED ORDER — FENTANYL CITRATE (PF) 250 MCG/5ML IJ SOLN
INTRAMUSCULAR | Status: AC
  Filled 2019-10-10: qty 5

## 2019-10-10 MED ORDER — POLYETHYLENE GLYCOL 3350 17 G PO PACK: 17.0000 g | PACK | Freq: Every day | ORAL | Status: DC | PRN

## 2019-10-10 MED ORDER — SODIUM CHLORIDE 0.9 % IV SOLN
INTRAVENOUS | Status: DC | PRN
  Administered 2019-10-10 – 2019-10-13 (×2): 250 mL via INTRAVENOUS
  Administered 2019-10-25: 06:00:00 500 mL via INTRAVENOUS

## 2019-10-10 MED ORDER — LIDOCAINE-EPINEPHRINE 1 %-1:100000 IJ SOLN
INTRAMUSCULAR | Status: AC
  Filled 2019-10-10: qty 1

## 2019-10-10 MED ORDER — PROPOFOL 10 MG/ML IV BOLUS
INTRAVENOUS | Status: DC | PRN
  Administered 2019-10-10: 150 mg via INTRAVENOUS
  Administered 2019-10-10: 30 mg via INTRAVENOUS

## 2019-10-10 MED ORDER — CEFAZOLIN SODIUM-DEXTROSE 2-3 GM-%(50ML) IV SOLR
INTRAVENOUS | Status: DC | PRN
  Administered 2019-10-10: 2 g via INTRAVENOUS

## 2019-10-10 MED ORDER — ACETAMINOPHEN 10 MG/ML IV SOLN: 1000.0000 mg | Freq: Once | INTRAVENOUS | Status: DC | PRN

## 2019-10-10 MED ORDER — SODIUM CHLORIDE 0.9 % IV SOLN: INTRAVENOUS | Status: DC | PRN

## 2019-10-10 MED ORDER — BACITRACIN ZINC 500 UNIT/GM EX OINT
TOPICAL_OINTMENT | CUTANEOUS | Status: AC
  Filled 2019-10-10: qty 28.35

## 2019-10-10 MED ORDER — LABETALOL HCL 5 MG/ML IV SOLN
INTRAVENOUS | Status: AC
  Filled 2019-10-10: qty 8

## 2019-10-10 MED ORDER — ONDANSETRON HCL 4 MG/2ML IJ SOLN
INTRAMUSCULAR | Status: DC | PRN
  Administered 2019-10-10: 4 mg via INTRAVENOUS

## 2019-10-10 MED ORDER — ACETAMINOPHEN 650 MG RE SUPP: 650.0000 mg | RECTAL | Status: DC | PRN

## 2019-10-10 MED ORDER — HYDRALAZINE HCL 20 MG/ML IJ SOLN
10.0000 mg | Freq: Four times a day (QID) | INTRAMUSCULAR | Status: AC | PRN
  Administered 2019-10-10: 10 mg via INTRAVENOUS

## 2019-10-10 MED ORDER — HEMOSTATIC AGENTS (NO CHARGE) OPTIME
TOPICAL | Status: DC | PRN
  Administered 2019-10-10: 1 via TOPICAL

## 2019-10-10 MED ORDER — ONDANSETRON HCL 4 MG/2ML IJ SOLN: 4.0000 mg | INTRAMUSCULAR | Status: DC | PRN

## 2019-10-10 MED ORDER — ONDANSETRON HCL 4 MG/2ML IJ SOLN: 4.0000 mg | Freq: Once | INTRAMUSCULAR | Status: DC | PRN

## 2019-10-10 MED ORDER — BACITRACIN ZINC 500 UNIT/GM EX OINT
TOPICAL_OINTMENT | CUTANEOUS | Status: DC | PRN
  Administered 2019-10-10: 1 via TOPICAL

## 2019-10-10 MED ORDER — HYDROCODONE-ACETAMINOPHEN 5-325 MG PO TABS
1.0000 | ORAL_TABLET | ORAL | Status: DC | PRN
  Filled 2019-10-10: qty 1

## 2019-10-10 MED ORDER — LABETALOL HCL 5 MG/ML IV SOLN
INTRAVENOUS | Status: AC
  Filled 2019-10-10: qty 4

## 2019-10-10 MED ORDER — PROPOFOL 10 MG/ML IV BOLUS
INTRAVENOUS | Status: AC
  Filled 2019-10-10: qty 20

## 2019-10-10 MED ORDER — THROMBIN 20000 UNITS EX SOLR
CUTANEOUS | Status: DC | PRN
  Administered 2019-10-10: 20 mL

## 2019-10-10 MED ORDER — THROMBIN 5000 UNITS EX SOLR
OROMUCOSAL | Status: DC | PRN
  Administered 2019-10-10: 5 mL

## 2019-10-10 MED ORDER — HYDROMORPHONE HCL 1 MG/ML IJ SOLN
0.5000 mg | INTRAMUSCULAR | Status: DC | PRN
  Administered 2019-10-11 – 2019-10-19 (×4): 0.5 mg via INTRAVENOUS
  Filled 2019-10-10 (×4): qty 1

## 2019-10-10 MED ORDER — CEFAZOLIN SODIUM-DEXTROSE 2-4 GM/100ML-% IV SOLN
INTRAVENOUS | Status: AC
  Filled 2019-10-10: qty 100

## 2019-10-10 MED ORDER — PHENYLEPHRINE HCL-NACL 10-0.9 MG/250ML-% IV SOLN
INTRAVENOUS | Status: DC | PRN
  Administered 2019-10-10: 25 ug/min via INTRAVENOUS

## 2019-10-10 MED ORDER — 0.9 % SODIUM CHLORIDE (POUR BTL) OPTIME
TOPICAL | Status: DC | PRN
  Administered 2019-10-10: 3000 mL

## 2019-10-10 MED ORDER — HYDRALAZINE HCL 20 MG/ML IJ SOLN
INTRAMUSCULAR | Status: AC
  Filled 2019-10-10: qty 1

## 2019-10-10 MED ORDER — FAMOTIDINE IN NACL 20-0.9 MG/50ML-% IV SOLN
20.0000 mg | Freq: Two times a day (BID) | INTRAVENOUS | Status: DC
  Administered 2019-10-10: 20 mg via INTRAVENOUS
  Filled 2019-10-10: qty 50

## 2019-10-10 MED ORDER — ACETAMINOPHEN 325 MG PO TABS: 650.0000 mg | ORAL_TABLET | ORAL | Status: DC | PRN

## 2019-10-10 MED ORDER — FENTANYL CITRATE (PF) 100 MCG/2ML IJ SOLN: 25.0000 ug | INTRAMUSCULAR | Status: DC | PRN

## 2019-10-10 MED ORDER — THROMBIN 5000 UNITS EX SOLR
CUTANEOUS | Status: AC
  Filled 2019-10-10: qty 5000

## 2019-10-10 MED ORDER — FENTANYL CITRATE (PF) 250 MCG/5ML IJ SOLN
INTRAMUSCULAR | Status: DC | PRN
  Administered 2019-10-10 (×3): 50 ug via INTRAVENOUS
  Administered 2019-10-10: 100 ug via INTRAVENOUS
  Administered 2019-10-10 (×2): 50 ug via INTRAVENOUS

## 2019-10-10 MED ORDER — LABETALOL HCL 5 MG/ML IV SOLN
10.0000 mg | INTRAVENOUS | Status: DC | PRN
  Administered 2019-10-10: 20 mg via INTRAVENOUS
  Administered 2019-10-10: 40 mg via INTRAVENOUS
  Administered 2019-10-10 – 2019-10-11 (×5): 20 mg via INTRAVENOUS
  Administered 2019-10-11: 03:00:00 40 mg via INTRAVENOUS
  Administered 2019-10-11: 20 mg via INTRAVENOUS
  Administered 2019-10-11: 10 mg via INTRAVENOUS
  Administered 2019-10-12 (×4): 20 mg via INTRAVENOUS
  Administered 2019-10-12: 10 mg via INTRAVENOUS
  Administered 2019-10-13 – 2019-10-16 (×4): 20 mg via INTRAVENOUS
  Administered 2019-10-17: 40 mg via INTRAVENOUS
  Administered 2019-10-17 – 2019-10-18 (×4): 20 mg via INTRAVENOUS
  Filled 2019-10-10 (×6): qty 4
  Filled 2019-10-10: qty 8
  Filled 2019-10-10 (×8): qty 4
  Filled 2019-10-10 (×2): qty 8
  Filled 2019-10-10: qty 4
  Filled 2019-10-10: qty 8
  Filled 2019-10-10: qty 4

## 2019-10-10 MED ORDER — MICROFIBRILLAR COLL HEMOSTAT EX PADS
MEDICATED_PAD | CUTANEOUS | Status: DC | PRN
  Administered 2019-10-10: 1 via TOPICAL

## 2019-10-10 MED ORDER — LIDOCAINE 2% (20 MG/ML) 5 ML SYRINGE
INTRAMUSCULAR | Status: DC | PRN
  Administered 2019-10-10: 60 mg via INTRAVENOUS

## 2019-10-10 MED ORDER — DOCUSATE SODIUM 100 MG PO CAPS: 100.0000 mg | ORAL_CAPSULE | Freq: Two times a day (BID) | ORAL | Status: DC

## 2019-10-10 MED ORDER — THROMBIN 20000 UNITS EX SOLR
CUTANEOUS | Status: AC
  Filled 2019-10-10: qty 20000

## 2019-10-10 MED ORDER — CEFAZOLIN SODIUM-DEXTROSE 2-4 GM/100ML-% IV SOLN
2.0000 g | Freq: Three times a day (TID) | INTRAVENOUS | Status: AC
  Administered 2019-10-11 (×2): 2 g via INTRAVENOUS
  Filled 2019-10-10 (×2): qty 100

## 2019-10-10 SURGICAL SUPPLY — 91 items
BENZOIN TINCTURE PRP APPL 2/3 (GAUZE/BANDAGES/DRESSINGS) IMPLANT
BLADE CLIPPER SURG (BLADE) ×2 IMPLANT
BLADE SAW GIGLI 16 STRL (MISCELLANEOUS) IMPLANT
BLADE SURG 15 STRL LF DISP TIS (BLADE) IMPLANT
BLADE SURG 15 STRL SS (BLADE)
BNDG GAUZE ELAST 4 BULKY (GAUZE/BANDAGES/DRESSINGS) IMPLANT
BNDG STRETCH 4X75 STRL LF (GAUZE/BANDAGES/DRESSINGS) IMPLANT
BUR ACORN 9.0 PRECISION (BURR) ×2 IMPLANT
BUR ROUND FLUTED 4 SOFT TCH (BURR) IMPLANT
BUR SPIRAL ROUTER 2.3 (BUR) ×2 IMPLANT
CANISTER SUCT 3000ML PPV (MISCELLANEOUS) ×4 IMPLANT
CATH VENTRIC 35X38 W/TROCAR LG (CATHETERS) IMPLANT
CLIP VESOCCLUDE MED 6/CT (CLIP) IMPLANT
CNTNR URN SCR LID CUP LEK RST (MISCELLANEOUS) ×1 IMPLANT
CONT SPEC 4OZ STRL OR WHT (MISCELLANEOUS) ×1
COVER MAYO STAND STRL (DRAPES) IMPLANT
COVER WAND RF STERILE (DRAPES) ×2 IMPLANT
DECANTER SPIKE VIAL GLASS SM (MISCELLANEOUS) ×2 IMPLANT
DRAIN SUBARACHNOID (WOUND CARE) IMPLANT
DRAPE HALF SHEET 40X57 (DRAPES) ×2 IMPLANT
DRAPE MICROSCOPE LEICA (MISCELLANEOUS) IMPLANT
DRAPE NEUROLOGICAL W/INCISE (DRAPES) ×2 IMPLANT
DRAPE STERI IOBAN 125X83 (DRAPES) IMPLANT
DRAPE SURG 17X23 STRL (DRAPES) IMPLANT
DRAPE WARM FLUID 44X44 (DRAPES) ×2 IMPLANT
DRSG ADAPTIC 3X8 NADH LF (GAUZE/BANDAGES/DRESSINGS) IMPLANT
DRSG TELFA 3X8 NADH (GAUZE/BANDAGES/DRESSINGS) IMPLANT
DURAPREP 6ML APPLICATOR 50/CS (WOUND CARE) ×2 IMPLANT
ELECT REM PT RETURN 9FT ADLT (ELECTROSURGICAL) ×2
ELECTRODE REM PT RTRN 9FT ADLT (ELECTROSURGICAL) ×1 IMPLANT
EVACUATOR 1/8 PVC DRAIN (DRAIN) IMPLANT
EVACUATOR SILICONE 100CC (DRAIN) IMPLANT
FEE INTRAOP MONITOR IMPULS NCS (MISCELLANEOUS) ×1 IMPLANT
FORCEPS BIPOLAR SPETZLER 8 1.0 (NEUROSURGERY SUPPLIES) ×2 IMPLANT
GAUZE 4X4 16PLY RFD (DISPOSABLE) IMPLANT
GAUZE SPONGE 4X4 12PLY STRL (GAUZE/BANDAGES/DRESSINGS) IMPLANT
GLOVE BIO SURGEON STRL SZ 6.5 (GLOVE) ×2 IMPLANT
GLOVE BIO SURGEON STRL SZ7 (GLOVE) ×2 IMPLANT
GLOVE BIO SURGEON STRL SZ7.5 (GLOVE) ×2 IMPLANT
GLOVE BIOGEL PI IND STRL 7.0 (GLOVE) IMPLANT
GLOVE BIOGEL PI IND STRL 7.5 (GLOVE) ×1 IMPLANT
GLOVE BIOGEL PI INDICATOR 7.0 (GLOVE)
GLOVE BIOGEL PI INDICATOR 7.5 (GLOVE) ×1
GLOVE EXAM NITRILE LRG STRL (GLOVE) IMPLANT
GLOVE EXAM NITRILE XL STR (GLOVE) ×2 IMPLANT
GLOVE EXAM NITRILE XS STR PU (GLOVE) IMPLANT
GOWN STRL REUS W/ TWL LRG LVL3 (GOWN DISPOSABLE) ×2 IMPLANT
GOWN STRL REUS W/ TWL XL LVL3 (GOWN DISPOSABLE) IMPLANT
GOWN STRL REUS W/TWL 2XL LVL3 (GOWN DISPOSABLE) IMPLANT
GOWN STRL REUS W/TWL LRG LVL3 (GOWN DISPOSABLE) ×2
GOWN STRL REUS W/TWL XL LVL3 (GOWN DISPOSABLE)
HEMOSTAT POWDER KIT SURGIFOAM (HEMOSTASIS) ×2 IMPLANT
HEMOSTAT SURGICEL 2X14 (HEMOSTASIS) ×2 IMPLANT
HOOK DURA 1/2IN (MISCELLANEOUS) ×2 IMPLANT
INTRAOP MONITOR FEE IMPULS NCS (MISCELLANEOUS) ×1
INTRAOP MONITOR FEE IMPULSE (MISCELLANEOUS) ×1
IV NS 1000ML (IV SOLUTION) ×1
IV NS 1000ML BAXH (IV SOLUTION) ×1 IMPLANT
KIT BASIN OR (CUSTOM PROCEDURE TRAY) ×2 IMPLANT
KIT DRAIN CSF ACCUDRAIN (MISCELLANEOUS) IMPLANT
KIT TURNOVER KIT B (KITS) ×2 IMPLANT
NEEDLE HYPO 22GX1.5 SAFETY (NEEDLE) ×2 IMPLANT
NEEDLE SPNL 18GX3.5 QUINCKE PK (NEEDLE) IMPLANT
NS IRRIG 1000ML POUR BTL (IV SOLUTION) ×6 IMPLANT
PACK CRANIOTOMY CUSTOM (CUSTOM PROCEDURE TRAY) ×2 IMPLANT
PATTIES SURGICAL .25X.25 (GAUZE/BANDAGES/DRESSINGS) IMPLANT
PATTIES SURGICAL .5 X.5 (GAUZE/BANDAGES/DRESSINGS) IMPLANT
PATTIES SURGICAL .5 X3 (DISPOSABLE) IMPLANT
PATTIES SURGICAL 1/4 X 3 (GAUZE/BANDAGES/DRESSINGS) IMPLANT
PATTIES SURGICAL 1X1 (DISPOSABLE) IMPLANT
PIN MAYFIELD SKULL DISP (PIN) ×2 IMPLANT
RUBBERBAND STERILE (MISCELLANEOUS) IMPLANT
SCREW UNIII AXS SD 1.5X4 (Screw) ×10 IMPLANT
SPECIMEN JAR SMALL (MISCELLANEOUS) IMPLANT
SPONGE NEURO XRAY DETECT 1X3 (DISPOSABLE) IMPLANT
SPONGE SURGIFOAM ABS GEL 100 (HEMOSTASIS) ×2 IMPLANT
STAPLER VISISTAT 35W (STAPLE) ×2 IMPLANT
SUT ETHILON 3 0 FSL (SUTURE) IMPLANT
SUT ETHILON 3 0 PS 1 (SUTURE) IMPLANT
SUT MNCRL AB 3-0 PS2 18 (SUTURE) IMPLANT
SUT NURALON 4 0 TR CR/8 (SUTURE) ×6 IMPLANT
SUT SILK 0 TIES 10X30 (SUTURE) IMPLANT
SUT VIC AB 2-0 CP2 18 (SUTURE) ×4 IMPLANT
TIP NONSTICK .5MMX23CM (INSTRUMENTS) ×1
TIP NONSTICK .5X23 (INSTRUMENTS) ×1 IMPLANT
TOWEL GREEN STERILE (TOWEL DISPOSABLE) ×2 IMPLANT
TOWEL GREEN STERILE FF (TOWEL DISPOSABLE) ×2 IMPLANT
TRAY FOLEY MTR SLVR 16FR STAT (SET/KITS/TRAYS/PACK) ×2 IMPLANT
TUBE CONNECTING 12X1/4 (SUCTIONS) ×2 IMPLANT
UNDERPAD 30X30 (UNDERPADS AND DIAPERS) ×2 IMPLANT
WATER STERILE IRR 1000ML POUR (IV SOLUTION) ×2 IMPLANT

## 2019-10-10 NOTE — Progress Notes (Signed)
Dr. Zada Finders at bedside to check on the patient.  Pt exhibits right side facial droop and right eye lid will not close and doesn't blink.  MD says he expected to see these findings and will place orders in to address these issues for 4N.

## 2019-10-10 NOTE — Progress Notes (Signed)
Neurosurgery Service Progress Note  Subjective: No acute events overnight, headache today, no nausea, complaining of severe fatigue  Objective: Vitals:   10/09/19 2016 10/10/19 0019 10/10/19 0431 10/10/19 0829  BP: 120/83 116/79 (!) 161/100 (!) (P) 156/90  Pulse: 83 73 70 (P) 69  Resp: 20 18 19  (P) 18  Temp: 98 F (36.7 C) 97.7 F (36.5 C) 97.6 F (36.4 C) (P) 97.9 F (36.6 C)  TempSrc: Oral Oral Oral (P) Oral  SpO2: 97% 97% 98% (P) 98%  Weight:      Height:       Temp (24hrs), Avg:97.9 F (36.6 C), Min:97.5 F (36.4 C), Max:98.7 F (37.1 C)  CBC Latest Ref Rng & Units 10/10/2019 10/09/2019 10/08/2019  WBC 4.0 - 10.5 K/uL 19.7(H) 20.2(H) 23.9(H)  Hemoglobin 13.0 - 17.0 g/dL 13.7 12.9(L) 13.1  Hematocrit 39.0 - 52.0 % 42.0 39.2 39.4  Platelets 150 - 400 K/uL 176 154 168   BMP Latest Ref Rng & Units 10/07/2019 10/07/2019 10/07/2019  Glucose 70 - 99 mg/dL 121(H) - 134(H)  BUN 8 - 23 mg/dL 30(H) - 35(H)  Creatinine 0.61 - 1.24 mg/dL 0.70 - 0.88  Sodium 135 - 145 mmol/L 137 133(L) 137  Potassium 3.5 - 5.1 mmol/L 4.7 4.7 4.7  Chloride 98 - 111 mmol/L 104 - 97(L)  CO2 22 - 32 mmol/L - - 30  Calcium 8.9 - 10.3 mg/dL - - 8.8(L)    Intake/Output Summary (Last 24 hours) at 10/10/2019 0856 Last data filed at 10/10/2019 0400 Gross per 24 hour  Intake 600 ml  Output 850 ml  Net -250 ml    Current Facility-Administered Medications:  .  Chlorhexidine Gluconate Cloth 2 % PADS 6 each, 6 each, Topical, Daily, Judith Part, MD, 6 each at 10/09/19 [phone] .  cholecalciferol (VITAMIN D3) tablet 4,000 Units, 4,000 Units, Oral, Daily, Judith Part, MD, 4,000 Units at 10/09/19 0957 .  feeding supplement (ENSURE ENLIVE) (ENSURE ENLIVE) liquid 237 mL, 237 mL, Oral, TID BM, Allsion Nogales A, MD, 237 mL at 10/09/19 1952 .  heparin injection 5,000 Units, 5,000 Units, Subcutaneous, Q8H, Judith Part, MD, 5,000 Units at 10/10/19 [phone] .  HYDROmorphone (DILAUDID) injection 0.5 mg,  0.5 mg, Intravenous, Q3H PRN, Rylynne Schicker A, MD .  insulin aspart (novoLOG) injection 0-6 Units, 0-6 Units, Subcutaneous, TID WC, Brexton Sofia, Joyice Faster, MD, 1 Units at 10/08/19 1809 .  lactated ringers infusion, , Intravenous, Continuous, Ziere Docken, Joyice Faster, MD, Stopped at 10/08/19 1143 .  melatonin tablet 3 mg, 3 mg, Oral, QHS, Shawn Dannenberg, Joyice Faster, MD, 3 mg at 10/09/19 2258 .  menthol-cetylpyridinium (CEPACOL) lozenge 3 mg, 1 lozenge, Oral, PRN, Judith Part, MD, 3 mg at 10/08/19 1153 .  multivitamin with minerals tablet 1 tablet, 1 tablet, Oral, Daily, Judith Part, MD, 1 tablet at 10/09/19 0957 .  ondansetron (ZOFRAN) tablet 4 mg, 4 mg, Oral, Q6H PRN **OR** ondansetron (ZOFRAN) injection 4 mg, 4 mg, Intravenous, Q6H PRN, Judith Part, MD, 4 mg at 10/08/19 1140 .  oxyCODONE (Oxy IR/ROXICODONE) immediate release tablet 5-10 mg, 5-10 mg, Oral, Q4H PRN, Ashok Pall, MD, 5 mg at 10/09/19 0957 .  polyethylene glycol (MIRALAX / GLYCOLAX) packet 17 g, 17 g, Oral, Daily PRN, Judith Part, MD, 17 g at 10/02/19 0929 .  senna (SENOKOT) tablet 17.2 mg, 2 tablet, Oral, QHS, Diquan Kassis A, MD, 17.2 mg at 10/09/19 2258 .  senna-docusate (Senokot-S) tablet 2 tablet, 2 tablet, Oral, QHS PRN, Emelda Brothers  A, MD .  traMADol (ULTRAM) tablet 50 mg, 50 mg, Oral, Q6H PRN, Ashok Pall, MD, 50 mg at 10/10/19 0414 .  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 MRI with good debulking of tumor, pre-post op measurements show decrease in tumor volume from 19.7cc-->4.3cc but these images use large slice thickness on [id number] and  visually it does not appear that dramatic.  -PT/OT recs CIR, PM&R consulted, discharge on hold until AMS workup complete -final path pending -SCDs/TEDs, SQH -more somnolent than expected, will get [facility] to r/o HCP / post op hemorrhage and add on CMP / NH4  Judith Part  10/10/19 8:56 AM

## 2019-10-10 NOTE — Anesthesia Procedure Notes (Signed)
Procedure Name: Intubation Date/Time: 10/10/2019 5:54 PM Performed by: Clearnce Sorrel, CRNA Pre-anesthesia Checklist: Patient identified, Emergency Drugs available, Suction available, Patient being monitored and Timeout performed Patient Re-evaluated:Patient Re-evaluated prior to induction Oxygen Delivery Method: Circle system utilized Preoxygenation: Pre-oxygenation with 100% oxygen Induction Type: IV induction Ventilation: Mask ventilation without difficulty Laryngoscope Size: Mac and 3 Grade View: Grade I Tube type: Oral Tube size: 7.5 mm Number of attempts: 1 Airway Equipment and Method: Stylet Placement Confirmation: ETT inserted through vocal cords under direct vision,  positive ETCO2 and breath sounds checked- equal and bilateral Secured at: 23 cm Tube secured with: Tape Dental Injury: Teeth and Oropharynx as per pre-operative assessment

## 2019-10-10 NOTE — Progress Notes (Signed)
Neurosurgery Service Post-operative progress note  Assessment & Plan: 64 y.o. man s/p R retrosig, evacuation of post-op hematoma, seen in PACU, FCx4, obvious R facial weakness, which is not surprising given the findings at surgery.  -4N ICU -CT head tonight to confirm ventricles / brainstem are well decompressed -eye drops to R eye until he's able to close it completely  Judith Part  10/10/19 9:11 PM

## 2019-10-10 NOTE — Progress Notes (Signed)
[place] reviewed, pt has new delayed post-operative hemorrhage with brainstem compression and developing hydrocephalus. Will take to the OR tonight and remove the hematoma as well as any residual tumor, as much as technically possible while preserving facial function. Keep NPO, will call family to update them shortly.

## 2019-10-10 NOTE — Progress Notes (Signed)
Inpatient Rehab Admissions Coordinator:   Met with pt at bedside.  Note significantly lethargic, does not awake to loud voice.  Note per neurosurgery workup for AMS pending.  Will continue to follow.   Shann Medal, PT, DPT Admissions Coordinator [phone] 10/10/19  10:39 AM

## 2019-10-10 NOTE — Op Note (Signed)
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 Post-op MRI showed expected subtotal resection, no evidence of post-op hematoma. Today he had progressively increasing headache and somnolence, CTH showed hemorrhage into the resection cavity with brainstem compression and developing hydrocephalus. I therefore recommended return to the OR for removal of the hematoma and any residual tumor that was bleeding.    OPERATIVE DETAIL: The patient was taken to the operating room and placed on the OR table. A formal time out was performed with two patient identifiers and confirmed the operative site. Anesthesia was induced by the anesthesia team. The [place] head holder was placed on the patient's head, the head was turned to the left and secured to the OR table. The prior operative site was marked, hair was clipped with surgical clippers, the area was then prepped and draped in a sterile fashion.   The prior incision was opened, soft tissues retracted, and the cranioplasty plate was removed. The dura was opened, the brain was, as expected, tense and it took significant effort to find the petrous face and mobilize the cerebellum. No CSF was able to be drained until I evacuated hematoma. I approached towards the tentorium to avoid the 7-8 complex, stimulated to confirm lack of stim, and started removing hematoma. I eventually found CN5 plastered up against the tentorium. I then worked inferiorly and located the lower cranial nerves and removed hematoma between them and the 7-8  complex. I was then able to locate 7-8 and dissect clot down to them. I turned attention to the brainstem and removed the large portion of the remaining clot off the brainstem. It was, as expected, quite difficult in all corridors due to dense, adherent clot.   I was able to remove all of the visible clot while protecting the 7-8 complex. The 5th nerve was deformed against the tent and the lower cranial nerves were very stretched inferiorly from the clot. Hemostasis was confirmed with multiple rounds of irrigation, the cerebellum was lined with surgicel, the dura was reapproximated with suture, gelfoam was placed in the epidural space, and the titanium plate was replaced with screws.  All instrument and sponge counts were correct, the incision was then closed in layers. The patient was then returned to anesthesia for emergence. No apparent complications at the completion of the procedure.   EBL:  252mL   DRAINS: none   SPECIMENS: none   Judith Part, MD 10/10/19 4:47 PM

## 2019-10-10 NOTE — Anesthesia Preprocedure Evaluation (Addendum)
Anesthesia Evaluation  Patient identified by MRN, date of birth, ID bandGeneral Assessment Comment:Patient awake  Reviewed: Allergy & Precautions, NPO status , Patient's Chart, lab work & pertinent test results  Airway Mallampati: II  TM Distance: >3 FB Neck ROM: Full    Dental  (+) Chipped,    Pulmonary former smoker,    Pulmonary exam normal breath sounds clear to auscultation       Cardiovascular + CAD and + Past MI  Normal cardiovascular exam+ pacemaker  Rhythm:Regular Rate:Normal  ECG: NSR, rate 70. LVH   Neuro/Psych CT of the head: Right occipital craniectomy and cranioplasty related 2 debulking surgery for cystic mass of the right CP angle region. Gross measurements of the lesion today are 3.2 x 5.3 x 4.5 cm. Internal hemorrhage within the residual cystic lesion. Mass-effect upon the fourth ventricle with subsequent enlargement of the lateral and third ventricles consistent with mild hydrocephalus. Small amount of blood layering dependently in the occipital horns. Small amount of subdural blood along the inferior tentorium as well as dissecting inferiorly to and through the foramen magnum to the C1 level with mild mass effect upon the brainstem. Resolving subarachnoid air. negative psych ROS   GI/Hepatic negative GI ROS, Neg liver ROS,   Endo/Other  negative endocrine ROS  Renal/GU Renal disease     Musculoskeletal negative musculoskeletal ROS (+)   Abdominal   Peds  Hematology negative hematology ROS (+)   Anesthesia Other Findings BRAIN TUMOR  Reproductive/Obstetrics                          Anesthesia Physical  Anesthesia Plan  ASA: IV  Anesthesia Plan: General   Post-op Pain Management:    Induction: Intravenous  PONV Risk Score and Plan: 2 and Ondansetron, Dexamethasone and Treatment may vary due to age or medical condition  Airway Management Planned: Oral ETT  Additional  Equipment: Arterial line  Intra-op Plan:   Post-operative Plan: Possible Post-op intubation/ventilation and Extubation in OR  Informed Consent: I have reviewed the patients History and Physical, chart, labs and discussed the procedure including the risks, benefits and alternatives for the proposed anesthesia with the patient or authorized representative who has indicated his/her understanding and acceptance.     Dental advisory given and Consent reviewed with POA  Plan Discussed with: CRNA  Anesthesia Plan Comments: (Anesthetic plan discussed with brother via telephone)    Anesthesia Quick Evaluation

## 2019-10-10 NOTE — Transfer of Care (Signed)
Immediate Anesthesia Transfer of Care Note  Patient: Casey Richard  Procedure(s) Performed: CRANIOTOMY FOR EVACUATION OF HEMATOMA (Right Head)  Patient Location: PACU  Anesthesia Type:General  Level of Consciousness: awake, drowsy and patient cooperative  Airway & Oxygen Therapy: Patient Spontanous Breathing and Patient connected to nasal cannula oxygen  Post-op Assessment: Report given to RN, Post -op Vital signs reviewed and stable and Patient moving all extremities X 4  Post vital signs: Reviewed and stable  Last Vitals:  Vitals Value Taken Time  BP 144/93 10/10/19 2105  Temp    Pulse 89 10/10/19 2107  Resp 12 10/10/19 2107  SpO2 100 % 10/10/19 2107  Vitals shown include unvalidated device data.  Last Pain:  Vitals:   10/10/19 1340  TempSrc:   PainSc: Asleep         Complications: No apparent anesthesia complications

## 2019-10-10 NOTE — Progress Notes (Signed)
Returned phone call to pacemaker rep to let her know that we would remain in the PACU until she arrived.

## 2019-10-10 NOTE — Progress Notes (Signed)
Anesthesia MD notified pt systolic BP remains in the 180's after 60mg  labetalol was given per floor orders.  Cuff pressures 134/86.  MD wants to give 10 hydralazine and repeat in 15 minutes if necessary.  Also consulted with MD about pts pacemaker status and representative being on the way.  MD wants [place] to remain in the PACU until rep has arrived.

## 2019-10-10 NOTE — Anesthesia Procedure Notes (Signed)
Arterial Line Insertion Start/End4/22/2021 5:00 PM, 10/10/2019 5:00 PM Performed by: Clearnce Sorrel, CRNA  Patient location: Pre-op. Preanesthetic checklist: patient identified, IV checked, site marked, risks and benefits discussed, surgical consent, monitors and equipment checked, pre-op evaluation, timeout performed and anesthesia consent Lidocaine 1% used for infiltration Left, radial was placed Catheter size: 20 Fr Hand hygiene performed  and maximum sterile barriers used   Attempts: 1 Procedure performed without using ultrasound guided technique. Following insertion, dressing applied. Post procedure assessment: normal and unchanged

## 2019-10-10 NOTE — Progress Notes (Addendum)
PT Cancellation Note  Patient Details Name: Casey Richard MRN: [id number] DOB: 07-13-1955   Cancelled Treatment:    Reason Eval/Treat Not Completed: Fatigue/lethargy limiting ability to participate Attempted to see pt for PT this am. Pt is very lethargic, opening eyes briefly, and engaged with this therapist long enough to report headache. PT will continue to follow acutely.   Attempted to see pt this pm. RN reports pt continues to have severe headache and requests PT held today so that pt can try to rest. PT will follow up tomorrow.    Earney Navy, PTA Acute Rehabilitation Services Pager: [phone] Office: [phone]   10/10/2019, 10:53 AM

## 2019-10-10 NOTE — Progress Notes (Signed)
Medtr[employer] at bedside and pacer settings restored to original preoperative settings.

## 2019-10-10 NOTE — Brief Op Note (Signed)
10/10/2019  8:59 PM  PATIENT:  Casey Richard  64 y.o. male  PRE-OPERATIVE DIAGNOSIS:  Post-op HEMATOMA  POST-OPERATIVE DIAGNOSIS:  Same  PROCEDURE:  Procedure(s) with comments: CRANIOTOMY FOR EVACUATION OF HEMATOMA (Right) - CRANIOTOMY TUMOR EXCISION  SURGEON:  Surgeon(s) and Role:    * Francella Barnett, Joyice Faster, MD - Primary  PHYSICIAN ASSISTANT:   ASSISTANTS: none   ANESTHESIA:   general  EBL:  200 mL   BLOOD ADMINISTERED:none  DRAINS: none   LOCAL MEDICATIONS USED:  NONE  SPECIMEN:  No Specimen  DISPOSITION OF SPECIMEN:  N/A  COUNTS:  YES  TOURNIQUET:  * No tourniquets in log *  DICTATION: .Note written in EPIC  PLAN OF CARE: Admit to inpatient   PATIENT DISPOSITION:  PACU - hemodynamically stable.   Delay start of Pharmacological VTE agent (>24hrs) due to surgical blood loss or risk of bleeding: yes

## 2019-10-10 NOTE — Progress Notes (Signed)
Tomi Bamberger from [employer] called PACU after pt arrived and said she was suppose to be notified before the pt left the OR.  Report from the CRNA indicated that the pacer had not been turned off and nothing was needed to be done.  Upon further inspection a sheet was found in the chart that was to follow the patient to the PACU.  Nothing was circled as far as what action was taken by the rep and nothing was circled as to if the rep needed to be notified before the patient was discharged from PACU.

## 2019-10-11 ENCOUNTER — Inpatient Hospital Stay (HOSPITAL_COMMUNITY): Payer: Medicare Other

## 2019-10-11 ENCOUNTER — Encounter (HOSPITAL_COMMUNITY)
Admission: EM | Disposition: A | Discharge status: Skilled Nursing Facility | Payer: Self-pay | Source: Home / Self Care | Attending: Neurological Surgery

## 2019-10-11 ENCOUNTER — Encounter: Payer: Self-pay | Admitting: *Deleted

## 2019-10-11 LAB — GLUCOSE, CAPILLARY
Glucose-Capillary: 123 mg/dL — ABNORMAL HIGH (ref 70–99)
Glucose-Capillary: 125 mg/dL — ABNORMAL HIGH (ref 70–99)
Glucose-Capillary: 122 mg/dL — ABNORMAL HIGH (ref 70–99)
Glucose-Capillary: 116 mg/dL — ABNORMAL HIGH (ref 70–99)
Glucose-Capillary: 151 mg/dL — ABNORMAL HIGH (ref 70–99)

## 2019-10-11 SURGERY — CRANIOTOMY TUMOR EXCISION
Anesthesia: General | Laterality: Right

## 2019-10-11 MED ORDER — NICARDIPINE HCL IN NACL 20-0.86 MG/200ML-% IV SOLN
3.0000 mg/h | INTRAVENOUS | Status: DC
  Administered 2019-10-11: 7.5 mg/h via INTRAVENOUS
  Administered 2019-10-11 (×2): 5 mg/h via INTRAVENOUS
  Administered 2019-10-11: 12.5 mg/h via INTRAVENOUS
  Administered 2019-10-11: 7.5 mg/h via INTRAVENOUS
  Administered 2019-10-11: 12.5 mg/h via INTRAVENOUS
  Administered 2019-10-12: 7.5 mg/h via INTRAVENOUS
  Administered 2019-10-12: 12.5 mg/h via INTRAVENOUS
  Administered 2019-10-12: 6 mg/h via INTRAVENOUS
  Administered 2019-10-12 (×2): 10 mg/h via INTRAVENOUS
  Administered 2019-10-12: 6 mg/h via INTRAVENOUS
  Administered 2019-10-12: 10 mg/h via INTRAVENOUS
  Administered 2019-10-12: 7.5 mg/h via INTRAVENOUS
  Administered 2019-10-13: 12.5 mg/h via INTRAVENOUS
  Administered 2019-10-13 (×2): 10 mg/h via INTRAVENOUS
  Administered 2019-10-13: 7.5 mg/h via INTRAVENOUS
  Administered 2019-10-13: 12.5 mg/h via INTRAVENOUS
  Administered 2019-10-13: 10 mg/h via INTRAVENOUS
  Administered 2019-10-13 (×2): 12.5 mg/h via INTRAVENOUS
  Administered 2019-10-13: 10 mg/h via INTRAVENOUS
  Administered 2019-10-13: 12.5 mg/h via INTRAVENOUS
  Administered 2019-10-13: 10 mg/h via INTRAVENOUS
  Administered 2019-10-13: 13:00:00 12.5 mg/h via INTRAVENOUS
  Administered 2019-10-13: 10 mg/h via INTRAVENOUS
  Administered 2019-10-14: 5 mg/h via INTRAVENOUS
  Administered 2019-10-14: 12.5 mg/h via INTRAVENOUS
  Administered 2019-10-14 (×2): 10 mg/h via INTRAVENOUS
  Administered 2019-10-14: 7.5 mg/h via INTRAVENOUS
  Filled 2019-10-11 (×2): qty 200
  Filled 2019-10-11: qty 600
  Filled 2019-10-11 (×3): qty 200
  Filled 2019-10-11: qty 400
  Filled 2019-10-11: qty 200
  Filled 2019-10-11: qty 600
  Filled 2019-10-11 (×5): qty 200
  Filled 2019-10-11: qty 400
  Filled 2019-10-11: qty 200
  Filled 2019-10-11: qty 600
  Filled 2019-10-11 (×5): qty 200
  Filled 2019-10-11: qty 400
  Filled 2019-10-11: qty 200

## 2019-10-11 MED ORDER — PRO-STAT SUGAR FREE PO LIQD
30.0000 mL | Freq: Every day | ORAL | Status: DC
  Administered 2019-10-11 – 2019-11-06 (×25): 30 mL
  Filled 2019-10-11 (×25): qty 30

## 2019-10-11 MED ORDER — CHLORHEXIDINE GLUCONATE CLOTH 2 % EX PADS
6.0000 | MEDICATED_PAD | Freq: Every day | CUTANEOUS | Status: DC
  Administered 2019-10-11 – 2019-11-15 (×34): 6 via TOPICAL

## 2019-10-11 MED ORDER — ENALAPRILAT 1.25 MG/ML IV SOLN
1.2500 mg | Freq: Four times a day (QID) | INTRAVENOUS | Status: DC | PRN
  Administered 2019-10-12: 1.25 mg via INTRAVENOUS
  Filled 2019-10-11 (×2): qty 1

## 2019-10-11 MED ORDER — OSMOLITE 1.2 CAL PO LIQD
1000.0000 mL | ORAL | Status: DC
  Administered 2019-10-11 – 2019-11-03 (×26): 1000 mL
  Filled 2019-10-11 (×48): qty 1000

## 2019-10-11 MED ORDER — SODIUM CHLORIDE 0.9 % IV SOLN: INTRAVENOUS | Status: DC

## 2019-10-11 NOTE — Evaluation (Signed)
Clinical/Bedside Swallow Evaluation Patient Details  Name: Casey Richard MRN: [id number] Date of Birth: 16-Oct-1955  Today's Date: 10/11/2019 Time: SLP Start Time (ACUTE ONLY): 40 SLP Stop Time (ACUTE ONLY): 1248 SLP Time Calculation (min) (ACUTE ONLY): 32 min  Past Medical History:  Past Medical History:  Diagnosis Date  . Cardiac arrest ([place])   . CHB (complete heart block) ([place])   . 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 Dysrhythmia 1/13   bradycardia, VF, cardiac arrest/complete heart block with pacer inserted/LOV Dr Sallyanne Kuster 07/21/11 with interrogation and anesthesia guideline order on chart. Chest x ray 1/13 EPIC,, TEE and [facility]  1/13 EPIC  . Memory loss, short term    from fall with heart block 1/13  . Nonsustained ventricular tachycardia ([place]) 02/07/2014  . Presence of permanent cardiac pacemaker 07/15/2011   Medtronic Revo  . Seasonal allergies   . Shortness of breath    Past Surgical History:  Past Surgical History:  Procedure Laterality Date  . CARDIAC CATHETERIZATION  07/09/2011   Normal coronaries  . CRANIOTOMY Right 10/07/2019   Procedure: RIGHT CRANIOTOMY TUMOR EXCISION;  Surgeon: Judith Part, MD;  Location: [facility];  Service: Neurosurgery;  Laterality: Right;  . CRANIOTOMY Right 10/10/2019   Procedure: CRANIOTOMY FOR EVACUATION OF HEMATOMA;  Surgeon: Judith Part, MD;  Location: [facility];  Service: Neurosurgery;  Laterality: Right;  CRANIOTOMY TUMOR EXCISION  . CYSTOSCOPY WITH URETHRAL DILATATION  08/19/2011   Procedure: CYSTOSCOPY WITH URETHRAL DILATATION;  Surgeon: Dutch Gray, MD;  Location: [facility];  Service: Urology;  Laterality: N/A;  Balloon Dilation of Urethral Stricture, retro-urethrogram, Suprapubic change   . LEFT HEART CATHETERIZATION WITH CORONARY ANGIOGRAM N/A 07/09/2011   Procedure: LEFT HEART CATHETERIZATION WITH CORONARY ANGIOGRAM;  Surgeon: Jettie Booze, MD;  Location: [facility] CATH LAB;  Service:  Cardiovascular;  Laterality: N/A;  . PACEMAKER INSERTION  07/15/11   Medtronic Revo  . PERMANENT PACEMAKER INSERTION Left 07/15/2011   Procedure: PERMANENT PACEMAKER INSERTION;  Surgeon: Sanda Klein, MD;  Location: [facility] CATH LAB;  Service: Cardiovascular;  Laterality: Left;  . TEMPORARY PACEMAKER INSERTION Right 07/09/2011   Procedure: TEMPORARY PACEMAKER INSERTION;  Surgeon: Jettie Booze, MD;  Location: [facility] CATH LAB;  Service: Cardiovascular;  Laterality: Right;   HPI:  64 y.o. male admitted with dizziness and gait instability.  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 MRI with good debulking of tumor, pre-post op measurements showed decrease in tumor volume from 19.7cc-->4.3cc. 4/22 presented with progressive obtundation, CTH with post-op resection cavity hemorrhage not present on MRI, brainstem compression and developing obstructive hydrocephalus, taken back to OR 4/22 for hematoma removal and hemostasis. Per surgery notes, unfortunately, as expected, there was post-op cranial nerve dysfunction after hematoma evacuation despite normal intra-op stimulation. Post-op CTH with improved mass effect on brainstem, slowly improving ventricular size with some IVH.   Assessment / Plan / Recommendation Clinical Impression  Pt presents with post-op cranial nerve dysfunction, leading to dysphagia, with self-reported impairments in sensation primarily.  There is LMN involvement of R CN V with reduced bulk of masseter upon contraction, decreased sensation on right; upper and lower CN VII involvement with asymmetry right face with reduced eye lid closure, flattened nasolabial fold, decreased labial retraction.  CN X, XII appear functional with tongue extension midline; soft palate midline and symmetric  upon elevation.  Pt's voice is normal, and he was able to  sustain "ah" without vocal breaks and for 11 seconds, WFL for duration.  Trials of thin liquids, ice chips, and nectar liquids elicited frequent throat-clearing and occasional cough.  Pt endorsed better ability to swallow purees (pudding,applesauce).  Recommend proceeding with MBS this afternoon to assess oropharyngeal pathophysiology.  Pt, RN agree.  Scheduled for 1:30 today.  SLP Visit Diagnosis: Dysphagia, oropharyngeal phase (R13.12)    Aspiration Risk       Diet Recommendation   nectar liquids pending study  Medication Administration: Crushed with puree    Other  Recommendations Oral Care Recommendations: Oral care BID Other Recommendations: Order thickener from pharmacy   Follow up Recommendations        Frequency and Duration            Prognosis        Swallow Study   General HPI: 64 y.o. male admitted with dizziness and gait instability.  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 MRI with good debulking of tumor, pre-post op measurements showed decrease in tumor volume from 19.7cc-->4.3cc. 4/22 presented with progressive obtundation, CTH with post-op resection cavity hemorrhage not present on MRI, brainstem compression and developing obstructive hydrocephalus, taken back to OR 4/22 for hematoma removal and hemostasis. Per surgery notes, unfortunately, as expected, there was post-op cranial nerve dysfunction after hematoma evacuation despite normal intra-op stimulation. Post-op CTH with improved mass effect on brainstem, slowly improving ventricular size with some IVH. Type of Study: Bedside Swallow Evaluation Diet Prior to this Study: Thin liquids Temperature Spikes Noted: No Respiratory Status: Nasal cannula History of Recent Intubation: (for surgery) Behavior/Cognition: Alert;Cooperative Oral Cavity Assessment: Dried  secretions Oral Care Completed by SLP: Yes Vision: Functional for self-feeding Self-Feeding Abilities: Needs assist Patient Positioning: Upright in bed Baseline Vocal Quality: Normal Volitional Cough: Strong Volitional Swallow: Able to elicit    Oral/Motor/Sensory Function Overall Oral Motor/Sensory Function: Moderate impairment Facial ROM: Reduced right;Suspected CN VII (facial) dysfunction Facial Symmetry: Abnormal symmetry right;Suspected CN VII (facial) dysfunction Facial Sensation: Suspected CN V (Trigeminal) dysfunction;Reduced right Lingual Symmetry: Within Functional Limits Velum: Within Functional Limits Mandible: Impaired;Suspected CN V (Trigeminal) dysfunction   Ice Chips Ice chips: Impaired Presentation: Self Fed Pharyngeal Phase Impairments: Throat Clearing - Immediate   Thin Liquid Thin Liquid: Impaired Presentation: Cup Oral Phase Impairments: Reduced labial seal Oral Phase Functional Implications: Right anterior spillage Pharyngeal  Phase Impairments: Cough - Delayed    Nectar Thick Nectar Thick Liquid: Impaired Presentation: Self Fed;Cup Oral phase functional implications: Right anterior spillage Pharyngeal Phase Impairments: Throat Clearing - Delayed   Honey Thick Honey Thick Liquid: Not tested   Puree Puree: Impaired Presentation: Self Fed;Spoon Oral Phase Impairments: Reduced labial seal   Solid     Solid: Not tested      Juan Quam Laurice 10/11/2019,1:07 PM  Mikita Lesmeister L. Tivis Ringer, [place] Office number [phone] Pager [phone]

## 2019-10-11 NOTE — Anesthesia Postprocedure Evaluation (Signed)
Anesthesia Post Note  Patient: Casey Richard  Procedure(s) Performed: CRANIOTOMY FOR EVACUATION OF HEMATOMA (Right Head)     Patient location during evaluation: PACU Anesthesia Type: General Level of consciousness: awake and alert Pain management: pain level controlled Vital Signs Assessment: post-procedure vital signs reviewed and stable Respiratory status: spontaneous breathing, nonlabored ventilation, respiratory function stable and patient connected to nasal cannula oxygen Cardiovascular status: blood pressure returned to baseline and stable Postop Assessment: no apparent nausea or vomiting Anesthetic complications: no    Last Vitals:  Vitals:   10/11/19 0645 10/11/19 0700  BP: 109/62 108/62  Pulse: 76 81  Resp: 12 (!) 23  Temp:    SpO2: 98% 99%    Last Pain:  Vitals:   10/11/19 0400  TempSrc: Axillary  PainSc: [name]

## 2019-10-11 NOTE — Progress Notes (Signed)
Kruse Shoupe [phone] (older brother)

## 2019-10-11 NOTE — Progress Notes (Signed)
Paged Dr. Marcello Moores of Neurosurgery. Patient's blood pressure remains above 0000000 systolic via arterial line despite use of PRN labetalol. New orders received for vasotec 1.25 mg IV PRN every 6 hours for systolic blood pressure over 160. Verbal orders also received for a cardene infusion. Will continue to monitor.

## 2019-10-11 NOTE — Progress Notes (Signed)
Nutrition Follow-up  DOCUMENTATION CODES:   Severe malnutrition in context of chronic illness  INTERVENTION:   Initiate Osmolite 1.2 @ 70 ml/hr via Cortrak tube 30 ml Prostat daily  Provides: 2116 kcal, 108 grams protein, and 1362 ml free water.    NUTRITION DIAGNOSIS:   Severe Malnutrition related to chronic illness as evidenced by severe muscle depletion, severe fat depletion, percent weight loss, energy intake < or equal to 75% for > or equal to 1 month.  Ongoing.  GOAL:   Patient will meet greater than or equal to 90% of their needs  Progressing.  MONITOR:   PO intake, Supplement acceptance, Weight trends, Labs  REASON FOR ASSESSMENT:   Consult Enteral/tube feeding initiation and management  ASSESSMENT:   Pt with a PMH significant for complete heart block s/p PPM who presented to the hospital for worsening dizziness, gait instability, and weight loss. MRI revealed 2 large brain masses.  4/19 s/p RIGHT CRANIOTOMY TUMOR EXCISION 4/22 s/p repeat crani for evacuation of hematoma after tumor excision  4/23 failed swallow, Cortrak placed; tip gastric   Pt discussed during ICU rounds and with RN.  Per RN pt felt he could not swallow, therefore SLP eval ordered  Labs reviewed: CBGs 123-125-122  Medications reviewed and include: colace   Diet Order:   Diet Order            Diet NPO time specified Except for: Ice Chips  Diet effective now              EDUCATION NEEDS:   Not appropriate for education at this time  Skin:  Skin Assessment: Skin Integrity Issues: Skin Integrity Issues:: Incisions Incisions: head  Last BM:  4/21  Height:   Ht Readings from Last 1 Encounters:  10/07/19 5\' 10"  (1.778 m)    Weight:   Wt Readings from Last 1 Encounters:  10/07/19 68 kg    BMI:  Body mass index is 21.52 kg/m.  Estimated Nutritional Needs:   Kcal:  2050-2250  Protein:  100-115 grams  Fluid:  >2L/d  Stclair Szymborski P., RD, LDN, CNSC See AMiON for  contact information

## 2019-10-11 NOTE — Progress Notes (Signed)
Neurosurgery Service Progress Note  Subjective: Had to return to the OR last night for post-op resection cavity hematoma with developing hydrocephalus. Post-op, no acute events, more awake this morning than preop  Objective: Vitals:   10/11/19 0630 10/11/19 0645 10/11/19 0700 10/11/19 0800  BP: 108/62 109/62 108/62   Pulse: 76 76 81   Resp: (!) 26 12 (!) 23   Temp:    (!) 97.5 F (36.4 C)  TempSrc:    Oral  SpO2: 98% 98% 99%   Weight:      Height:       Temp (24hrs), Avg:97.7 F (36.5 C), Min:97.4 F (36.3 C), Max:98.3 F (36.8 C)  CBC Latest Ref Rng & Units 10/10/2019 10/09/2019 10/08/2019  WBC 4.0 - 10.5 K/uL 19.7(H) 20.2(H) 23.9(H)  Hemoglobin 13.0 - 17.0 g/dL 13.7 12.9(L) 13.1  Hematocrit 39.0 - 52.0 % 42.0 39.2 39.4  Platelets 150 - 400 K/uL 176 154 168   BMP Latest Ref Rng & Units 10/10/2019 10/07/2019 10/07/2019  Glucose 70 - 99 mg/dL 125(H) 121(H) -  BUN 8 - 23 mg/dL 32(H) 30(H) -  Creatinine 0.61 - 1.24 mg/dL 0.70 0.70 -  Sodium 135 - 145 mmol/L 136 137 133(L)  Potassium 3.5 - 5.1 mmol/L 4.2 4.7 4.7  Chloride 98 - 111 mmol/L 97(L) 104 -  CO2 22 - 32 mmol/L 32 - -  Calcium 8.9 - 10.3 mg/dL 8.6(L) - -    Intake/Output Summary (Last 24 hours) at 10/11/2019 0853 Last data filed at 10/11/2019 0700 Gross per 24 hour  Intake 1906.99 ml  Output 2550 ml  Net -643.01 ml    Current Facility-Administered Medications:  .  0.9 %  sodium chloride infusion, , Intravenous, PRN, Judith Part, MD, Stopped at 10/11/19 0445 .  acetaminophen (TYLENOL) tablet 650 mg, 650 mg, Oral, Q4H PRN **OR** acetaminophen (TYLENOL) suppository 650 mg, 650 mg, Rectal, Q4H PRN, Joelyn Lover A, MD .  ceFAZolin (ANCEF) IVPB 2g/100 mL premix, 2 g, Intravenous, Q8H, Tiahna Cure, Joyice Faster, MD, Stopped at 10/11/19 0241 .  Chlorhexidine Gluconate Cloth 2 % PADS 6 each, 6 each, Topical, Daily, Judith Part, MD, 6 each at 10/10/19 [phone] .  docusate sodium (COLACE) capsule 100 mg, 100 mg, Oral,  BID, Anjulie Dipierro A, MD .  enalaprilat (VASOTEC) injection 1.25 mg, 1.25 mg, Intravenous, Q6H PRN, Vallarie Mare, MD .  famotidine (PEPCID) IVPB 20 mg premix, 20 mg, Intravenous, Q12H, Judith Part, MD, Stopped at 10/11/19 0003 .  hydrALAZINE (APRESOLINE) 20 MG/ML injection, , , ,  .  HYDROcodone-acetaminophen (NORCO/VICODIN) 5-325 MG per tablet 1 tablet, 1 tablet, Oral, Q4H PRN, Judith Part, MD .  HYDROmorphone (DILAUDID) injection 0.5 mg, 0.5 mg, Intravenous, Q3H PRN, Judith Part, MD, 0.5 mg at 10/11/19 0336 .  hydroxypropyl methylcellulose / hypromellose (ISOPTO TEARS / GONIOVISC) 2.5 % ophthalmic solution 1 drop, 1 drop, Right Eye, Q2H, Judith Part, MD, 1 drop at 10/11/19 0757 .  insulin aspart (novoLOG) injection 0-6 Units, 0-6 Units, Subcutaneous, TID WC, Judith Part, MD, 1 Units at 10/08/19 1809 .  labetalol (NORMODYNE) 5 MG/ML injection, , , ,  .  labetalol (NORMODYNE) 5 MG/ML injection, , , ,  .  labetalol (NORMODYNE) injection 10-40 mg, 10-40 mg, Intravenous, Q10 min PRN, Judith Part, MD, 20 mg at 10/11/19 0306 .  nicardipine (CARDENE) 20mg  in 0.86% saline 214ml IV infusion (0.1 mg/ml), 3-15 mg/hr, Intravenous, Titrated, Vallarie Mare, MD, Last Rate: 75 mL/hr at 10/11/19  0700, 7.5 mg/hr at 10/11/19 0700 .  ondansetron (ZOFRAN) tablet 4 mg, 4 mg, Oral, Q4H PRN **OR** ondansetron (ZOFRAN) injection 4 mg, 4 mg, Intravenous, Q4H PRN, Brock Mokry A, MD .  polyethylene glycol (MIRALAX / GLYCOLAX) packet 17 g, 17 g, Oral, Daily PRN, Judith Part, MD .  promethazine (PHENERGAN) tablet 12.5-25 mg, 12.5-25 mg, Oral, Q4H PRN, Judith Part, MD   Physical Exam: AOx2, PERRL, R eye taped shut, L eye full ROM in EOM, face with HB5 weakness on the R, speech hoarse with nasal quality, R SCM significant weakness, tongue midline, FCx4 with full strength  Assessment & Plan: 64 y.o. man s/p R retrosig resection of 6cm CPA  mass, recovering well. MRI with good debulking of tumor, pre-post op measurements show decrease in tumor volume from 19.7cc-->4.3cc. 4/22 progressive obtundation, CTH with post-op resection cavity hemorrhage not present on MRI, brainstem compression and developing obstructive hydrocephalus, taken back to OR for hematoma removal and hemostasis. Unfortunately, as expected, post-op cranial nerve dysfunction after hematoma evacuation despite normal intra-op stimulation. Post-op CTH with improved mass effect on brainstem, slowly improving ventricular size with some IVH  -speech eval, has obvious lower cranial nerve dysfunction on exam -eye drops to R eye, tape shut while asleep to protect cornea given facial weakness -keep in ICU for hydrocephalus watch, clinically doing better than preop -meds to IV and start IVF until speech eval -final path pending -SCDs/TEDs, hold SQH until [place]  10/11/19 8:53 AM

## 2019-10-11 NOTE — Progress Notes (Signed)
Modified Barium Swallow Progress Note  Patient Details  Name: Casey Richard MRN: [id number] Date of Birth: July 19, 1955  Today's Date: 10/11/2019  Modified Barium Swallow completed.  Full report located under Chart Review in the Imaging Section.  Brief recommendations include the following:  Clinical Impression  Pt presents with a significant post-surgical acute dysphagia, not unexpected, and leading to inability to transfer POs through the pharynx.  Study was limited due to severity of deficits.  There was poor base of tongue and pharyngeal propulsion, reduced mobility of the hyolaryngeal complex, and minimal opening of the UES.  Pureed barium filled the pharynx, and pt was unable to clear it through UES nor eject it back into oral cavity. Nectar liquids were not helpful in clearing, and spilled over posterior larynx below vocal cords.  Pt demonstrated a cough in response to aspiration.  He was able to see study in progress, and verbalized that he could see the barium but not sense material in his throat.  Study was discontinued and oral suctioning provided.  Recommend NPO for now excluding ice chips after oral care; cortrak pending improvements. D/W pt, RN.    Swallow Evaluation Recommendations       SLP Diet Recommendations: NPO;Alternative means - temporary;Ice chips PRN after oral care                       Oral Care Recommendations: Oral care QID   Other Recommendations: Order thickener from [place]. Tivis Ringer, [place] Office number [phone] Pager [phone]  Juan Quam Laurice 10/11/2019,2:37 PM

## 2019-10-11 NOTE — Evaluation (Signed)
Physical Therapy Re- Evaluation Patient Details Name: Casey Richard MRN: [id number] DOB: [birth year] Today's Date: 10/11/2019   History of Present Illness  64 y/o male admitted secondary to dizziness, weight loss, and gait instability. 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  MRI confirmed 2 large brain masses. S/p right crani for tumor excision on 10/07/19. PMH includes CKD and complete heart block s/p pacemaker. Neuro status changes s/p evacuation of post operative hematoma on 10/10/19.    Clinical Impression  Pt s/p hematoma evacuation with increased deficits than prior to neuro decline.  He has CN deficits with R eye taped shut and increased difficulty swallowing.  He has a R lateral lean and expressive speech deficits.  He did not feel up to getting up OOB this afternoon, but did sit EOB for >30 mins with me working on midline sitting balance.  He remains appropriate for CIR level therapies at discharge.  Goals updated.   PT to follow acutely for deficits listed below.      Follow Up Recommendations CIR    Equipment Recommendations  Rolling walker with 5" wheels;Hospital bed;Wheelchair (measurements PT);Wheelchair cushion (measurements PT);3in1 (PT)    Recommendations for Other Services Rehab consult     Precautions / Restrictions Precautions Precautions: Fall Precaution Comments: leans to the right       Mobility  Bed Mobility Overal bed mobility: Needs Assistance Bed Mobility: Supine to Sit;Sit to Supine     Supine to sit: Mod assist;HOB elevated Sit to supine: Mod assist;HOB elevated   General bed mobility comments: Mod assist mostly to support trunk for balance and to provide pt with step by step sequencing instructions on how to sit up and go back down.   Transfers                 General transfer comment: pt did not want to get up OOB this evening, but wanted to rest.           Balance Overall balance assessment: Needs assistance Sitting-balance support: Feet supported;Bilateral upper extremity supported Sitting balance-Leahy Scale: Poor Sitting balance - Comments: needs up to mod assist in sitting due to right lateral lean.  When asked pt reports he is leaning right and is able to correct midly for a minute before leaning back to the right.  Postural control: Right lateral lean                                   Pertinent Vitals/Pain Pain Assessment: Faces Faces Pain Scale: Hurts even more Pain Location: head Pain Descriptors / Indicators: Grimacing Pain Intervention(s): Limited activity within patient's tolerance;Monitored during session;Repositioned    Home Living Family/patient expects to be discharged to:: Private residence Living Arrangements: Children Available Help at Discharge: Family Type of Home: House Home Access: Stairs to enter Entrance Stairs-Rails: None Entrance Stairs-Number of Steps: 1 Home Layout: One level Home Equipment: Cane - single point Additional Comments: 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     Prior Function Level of Independence: Independent with assistive device(s)         Comments: Pt independent in ADLs and mobility. Pt reports sharing IADL responsibilities with sons. Pt reports using a cane the past few weeks due to increasing instability. Pt reports several falls in the last 6 months.      Hand Dominance  Dominant Hand: Right    Extremity/Trunk Assessment   Upper Extremity Assessment Upper Extremity Assessment: Defer to OT evaluation    Lower Extremity Assessment Lower Extremity Assessment: RLE deficits/detail;LLE deficits/detail RLE Deficits / Details: both legs are weak and he has difficulty following commands for MMT, but gross seated MMT at lest 3- to 3/5 EOB bil.  RLE Sensation: WNL LLE Deficits / Details: both legs are weak and he has difficulty following  commands for MMT, but gross seated MMT at lest 3- to 3/5 EOB bil.  LLE Sensation: WNL    Cervical / Trunk Assessment Cervical / Trunk Assessment: Normal(right lateral lean in sitting EOB, pt able to report correct)  Communication   Communication: Expressive difficulties(slurred)  Cognition Arousal/Alertness: Awake/alert Behavior During Therapy: WFL for tasks assessed/performed Overall Cognitive Status: Impaired/Different from baseline Area of Impairment: Orientation;Attention;Memory;Following commands;Safety/judgement;Awareness;Problem solving                 Orientation Level: Time Current Attention Level: Sustained Memory: Decreased short-term memory Following Commands: Follows one step commands consistently Safety/Judgement: Decreased awareness of safety;Decreased awareness of deficits Awareness: Emergent Problem Solving: Difficulty sequencing;Requires verbal cues;Requires tactile cues General Comments: Pt able to follow commands to the best of his ability with better processing speed than before his evacuation surgery per notes.  He is starting to get a bit confused on day of the week, but is generally oriented to where he is and why.  He sometimes has a bit of word salad happening when he talks.       General Comments General comments (skin integrity, edema, etc.): O2 sats as low as 88% on 2 L O2 [place] during session, wet cough        Assessment/Plan    PT Assessment Patient needs continued PT services  PT Problem List Decreased strength;Decreased activity tolerance;Decreased balance;Decreased mobility;Decreased cognition;Decreased coordination;Decreased knowledge of use of DME;Decreased safety awareness;Decreased knowledge of precautions;Pain;Cardiopulmonary status limiting activity       PT Treatment Interventions Gait training;DME instruction;Stair training;Functional mobility training;Therapeutic activities;Therapeutic exercise;Balance training;Cognitive  remediation;Patient/family education    PT Goals (Current goals can be found in the Care Plan section)  Acute Rehab PT Goals Patient Stated Goal: to go home PT Goal Formulation: With patient Time For Goal Achievement: 10/25/19 Potential to Achieve Goals: Good    Frequency Min 3X/week           AM-PAC PT "6 Clicks" Mobility  Outcome Measure Help needed turning from your back to your side while in a flat bed without using bedrails?: A Lot Help needed moving from lying on your back to sitting on the side of a flat bed without using bedrails?: A Lot Help needed moving to and from a bed to a chair (including a wheelchair)?: A Lot Help needed standing up from a chair using your arms (e.g., wheelchair or bedside chair)?: A Lot Help needed to walk in hospital room?: Total Help needed climbing 3-5 steps with a railing? : Total 6 Click Score: 10    End of Session Equipment Utilized During Treatment: Oxygen Activity Tolerance: Patient limited by fatigue;Patient limited by pain;Other (comment)(limited by dizziness) Patient left: in bed;with call bell/phone within reach;with bed alarm set Nurse Communication: Mobility status PT Visit Diagnosis: Muscle weakness (generalized) (M62.81);Difficulty in walking, not elsewhere classified (R26.2);Dizziness and giddiness (R42);Pain Pain - Right/Left: (head) Pain - part of body: (head)    Time: [id number] PT Time Calculation (min) (ACUTE ONLY): 38 min   Charges:  Verdene Lennert, PT, DPT  Acute Rehabilitation [phone] pager [phone] office     PT Evaluation $PT Re-evaluation: 1 Re-eval PT Treatments $Therapeutic Activity: 8-22 mins $Neuromuscular Re-education: 8-22 mins      10/11/2019, 5:14 PM

## 2019-10-11 NOTE — Procedures (Signed)
Cortrak  Person Inserting Tube:  Casey Richard, RD Tube Type:  Cortrak - 43 inches Tube Location:  Right nare Initial Placement:  Stomach Secured by: Bridle Technique Used to Measure Tube Placement:  Documented cm marking at nare/ corner of mouth Cortrak Secured At:  70 cm Procedure Comments:  Cortrak Tube Team Note:  Consult received to place a Cortrak feeding tube.   No x-ray is required. RN may begin using tube.   If the tube becomes dislodged please keep the tube and contact the Cortrak team at [email or web address] ([email or web address]) for replacement.  If after hours and replacement cannot be delayed, place a NG tube and confirm placement with an abdominal x-ray.      Casey Matin, MS, RD, LDN, CNSC Inpatient Clinical Dietitian RD pager # available in [place]  After hours/weekend pager # available in [employer]

## 2019-10-12 ENCOUNTER — Inpatient Hospital Stay (HOSPITAL_COMMUNITY): Payer: Medicare Other

## 2019-10-12 LAB — GLUCOSE, CAPILLARY
Glucose-Capillary: 144 mg/dL — ABNORMAL HIGH (ref 70–99)
Glucose-Capillary: 147 mg/dL — ABNORMAL HIGH (ref 70–99)
Glucose-Capillary: 138 mg/dL — ABNORMAL HIGH (ref 70–99)
Glucose-Capillary: 126 mg/dL — ABNORMAL HIGH (ref 70–99)
Glucose-Capillary: 139 mg/dL — ABNORMAL HIGH (ref 70–99)
Glucose-Capillary: 137 mg/dL — ABNORMAL HIGH (ref 70–99)

## 2019-10-12 LAB — MAGNESIUM
Magnesium: 2.1 mg/dL (ref 1.7–2.4)
Magnesium: 2.2 mg/dL (ref 1.7–2.4)

## 2019-10-12 LAB — PHOSPHORUS
Phosphorus: 2.2 mg/dL — ABNORMAL LOW (ref 2.5–4.6)
Phosphorus: 2 mg/dL — ABNORMAL LOW (ref 2.5–4.6)

## 2019-10-12 MED ORDER — ORAL CARE MOUTH RINSE
15.0000 mL | Freq: Two times a day (BID) | OROMUCOSAL | Status: DC
  Administered 2019-10-12 – 2019-10-14 (×5): 15 mL via OROMUCOSAL

## 2019-10-12 MED ORDER — HYDROCODONE-ACETAMINOPHEN 5-325 MG PO TABS
1.0000 | ORAL_TABLET | ORAL | Status: DC | PRN
  Administered 2019-10-17 – 2019-11-13 (×8): 1
  Filled 2019-10-12 (×12): qty 1

## 2019-10-12 MED ORDER — POLYETHYLENE GLYCOL 3350 17 G PO PACK: 17.0000 g | PACK | Freq: Every day | ORAL | Status: DC | PRN

## 2019-10-12 MED ORDER — CHLORHEXIDINE GLUCONATE 0.12 % MT SOLN
15.0000 mL | Freq: Two times a day (BID) | OROMUCOSAL | Status: DC
  Administered 2019-10-12 – 2019-10-14 (×6): 15 mL via OROMUCOSAL
  Filled 2019-10-12 (×4): qty 15

## 2019-10-12 MED ORDER — ACETAMINOPHEN 160 MG/5ML PO SOLN
650.0000 mg | ORAL | Status: DC | PRN
  Administered 2019-10-13 – 2019-11-14 (×10): 650 mg
  Filled 2019-10-12 (×12): qty 20.3

## 2019-10-12 MED ORDER — DOCUSATE SODIUM 50 MG/5ML PO LIQD
100.0000 mg | Freq: Two times a day (BID) | ORAL | Status: DC
  Administered 2019-10-12 – 2019-10-16 (×8): 100 mg
  Filled 2019-10-12 (×8): qty 10

## 2019-10-12 NOTE — Progress Notes (Signed)
eLink Physician-Brief Progress Note Patient Name: Casey Richard DOB: 10/18/1955 MRN: [id number]   Date of Service  10/12/2019  HPI/Events of Note  Patient briefly had HR in 200s during an episode of coughing/agitation. Self-resolved and HR is now high 90s-100s.  Telemetry shows a regular rhythm that appears to be A-sensed V-paced with resulting wide complex. The patient is hemodynamically stable.   eICU Interventions  No changes to management required at this time. Continue to monitor.     Intervention Category Intermediate Interventions: Arrhythmia - evaluation and management  Marily Lente Tywone Bembenek 10/12/2019, 11:05 PM

## 2019-10-12 NOTE — Progress Notes (Signed)
Patient ID: Casey Richard, male   DOB: Sep 30, 1955, 64 y.o.   MRN: [id number] BP 116/65   Pulse 88   Temp 97.8 F (36.6 C) (Axillary)   Resp 12   Ht 5\' 10"  (1.778 m)   Wt 67.9 kg   SpO2 95%   BMI 21.48 kg/m  Alert, following all commands Right facial droop, speech dysarthric Pupils reactive to light Moving all extremities well Wound is clean dry, no signs of infection.  Swallowing is quite poor, no po yet.

## 2019-10-13 ENCOUNTER — Inpatient Hospital Stay (HOSPITAL_COMMUNITY): Payer: Medicare Other

## 2019-10-13 DIAGNOSIS — J690 Pneumonitis due to inhalation of food and vomit: Secondary | ICD-10-CM

## 2019-10-13 DIAGNOSIS — R1310 Dysphagia, unspecified: Secondary | ICD-10-CM

## 2019-10-13 DIAGNOSIS — I159 Secondary hypertension, unspecified: Secondary | ICD-10-CM

## 2019-10-13 DIAGNOSIS — G9389 Other specified disorders of brain: Secondary | ICD-10-CM | POA: Diagnosis not present

## 2019-10-13 DIAGNOSIS — J9601 Acute respiratory failure with hypoxia: Secondary | ICD-10-CM | POA: Diagnosis not present

## 2019-10-13 LAB — POCT I-STAT 7, (LYTES, BLD GAS, ICA,H+H)
Acid-Base Excess: 0 mmol/L (ref 0.0–2.0)
Acid-Base Excess: 2 mmol/L (ref 0.0–2.0)
Bicarbonate: 22.5 mmol/L (ref 20.0–28.0)
Bicarbonate: 26.2 mmol/L (ref 20.0–28.0)
Calcium, Ion: 1.19 mmol/L (ref 1.15–1.40)
Calcium, Ion: 1.23 mmol/L (ref 1.15–1.40)
HCT: 32 % — ABNORMAL LOW (ref 39.0–52.0)
HCT: 33 % — ABNORMAL LOW (ref 39.0–52.0)
Hemoglobin: 10.9 g/dL — ABNORMAL LOW (ref 13.0–17.0)
Hemoglobin: 11.2 g/dL — ABNORMAL LOW (ref 13.0–17.0)
O2 Saturation: 89 %
O2 Saturation: 95 %
Patient temperature: 98.5
Patient temperature: 98.7
Potassium: 4 mmol/L (ref 3.5–5.1)
Potassium: 4 mmol/L (ref 3.5–5.1)
Sodium: 145 mmol/L (ref 135–145)
Sodium: 145 mmol/L (ref 135–145)
TCO2: 23 mmol/L (ref 22–32)
TCO2: 27 mmol/L (ref 22–32)
pCO2 arterial: 30 mmHg — ABNORMAL LOW (ref 32.0–48.0)
pCO2 arterial: 37 mmHg (ref 32.0–48.0)
pH, Arterial: 7.458 — ABNORMAL HIGH (ref 7.350–7.450)
pH, Arterial: 7.483 — ABNORMAL HIGH (ref 7.350–7.450)
pO2, Arterial: 52 mmHg — ABNORMAL LOW (ref 83.0–108.0)
pO2, Arterial: 69 mmHg — ABNORMAL LOW (ref 83.0–108.0)

## 2019-10-13 LAB — GLUCOSE, CAPILLARY
Glucose-Capillary: 131 mg/dL — ABNORMAL HIGH (ref 70–99)
Glucose-Capillary: 113 mg/dL — ABNORMAL HIGH (ref 70–99)
Glucose-Capillary: 101 mg/dL — ABNORMAL HIGH (ref 70–99)
Glucose-Capillary: 162 mg/dL — ABNORMAL HIGH (ref 70–99)
Glucose-Capillary: 106 mg/dL — ABNORMAL HIGH (ref 70–99)
Glucose-Capillary: 130 mg/dL — ABNORMAL HIGH (ref 70–99)

## 2019-10-13 LAB — COMPREHENSIVE METABOLIC PANEL
ALT: 48 U/L — ABNORMAL HIGH (ref 0–44)
AST: 25 U/L (ref 15–41)
Albumin: 2.5 g/dL — ABNORMAL LOW (ref 3.5–5.0)
Alkaline Phosphatase: 63 U/L (ref 38–126)
Anion gap: 9 (ref 5–15)
BUN: 35 mg/dL — ABNORMAL HIGH (ref 8–23)
CO2: 23 mmol/L (ref 22–32)
Calcium: 8.3 mg/dL — ABNORMAL LOW (ref 8.9–10.3)
Chloride: 112 mmol/L — ABNORMAL HIGH (ref 98–111)
Creatinine, Ser: 0.56 mg/dL — ABNORMAL LOW (ref 0.61–1.24)
GFR calc Af Amer: >60 mL/min (ref >60)
GFR calc non Af Amer: >60 mL/min (ref >60)
Glucose, Bld: 119 mg/dL — ABNORMAL HIGH (ref 70–99)
Potassium: 4.1 mmol/L (ref 3.5–5.1)
Sodium: 144 mmol/L (ref 135–145)
Total Bilirubin: 1.2 mg/dL (ref 0.3–1.2)
Total Protein: 5.4 g/dL — ABNORMAL LOW (ref 6.5–8.1)

## 2019-10-13 LAB — CBC
HCT: 36.2 % — ABNORMAL LOW (ref 39.0–52.0)
Hemoglobin: 12 g/dL — ABNORMAL LOW (ref 13.0–17.0)
MCH: 33.3 pg (ref 26.0–34.0)
MCHC: 33.1 g/dL (ref 30.0–36.0)
MCV: 100.6 fL — ABNORMAL HIGH (ref 80.0–100.0)
Platelets: 161 10*3/uL (ref 150–400)
RBC: 3.6 MIL/uL — ABNORMAL LOW (ref 4.22–5.81)
RDW: 13.9 % (ref 11.5–15.5)
WBC: 19.2 10*3/uL — ABNORMAL HIGH (ref 4.0–10.5)
nRBC: 0 % (ref 0.0–0.2)

## 2019-10-13 LAB — MAGNESIUM
Magnesium: 2.1 mg/dL (ref 1.7–2.4)
Magnesium: 2 mg/dL (ref 1.7–2.4)

## 2019-10-13 LAB — PHOSPHORUS
Phosphorus: 1.8 mg/dL — ABNORMAL LOW (ref 2.5–4.6)
Phosphorus: 3.2 mg/dL (ref 2.5–4.6)

## 2019-10-13 LAB — LACTIC ACID, PLASMA: Lactic Acid, Venous: 0.6 mmol/L (ref 0.5–1.9)

## 2019-10-13 LAB — PROCALCITONIN: Procalcitonin: 0.3 ng/mL

## 2019-10-13 MED ORDER — POTASSIUM PHOSPHATES 15 MMOLE/5ML IV SOLN
20.0000 mmol | Freq: Once | INTRAVENOUS | Status: AC
  Administered 2019-10-13: 13:00:00 20 mmol via INTRAVENOUS
  Filled 2019-10-13: qty 6.67

## 2019-10-13 MED ORDER — AMLODIPINE BESYLATE 5 MG PO TABS
5.0000 mg | ORAL_TABLET | Freq: Every day | ORAL | Status: DC
  Administered 2019-10-13 – 2019-10-16 (×4): 5 mg
  Filled 2019-10-13 (×5): qty 1

## 2019-10-13 MED ORDER — VANCOMYCIN HCL 1250 MG/250ML IV SOLN
1250.0000 mg | Freq: Once | INTRAVENOUS | Status: AC
  Administered 2019-10-13: 1250 mg via INTRAVENOUS
  Filled 2019-10-13: qty 250

## 2019-10-13 MED ORDER — VANCOMYCIN HCL IN DEXTROSE 1-5 GM/200ML-% IV SOLN
1000.0000 mg | Freq: Two times a day (BID) | INTRAVENOUS | Status: DC
  Administered 2019-10-14: 1000 mg via INTRAVENOUS
  Filled 2019-10-13: qty 200

## 2019-10-13 MED ORDER — PIPERACILLIN-TAZOBACTAM 3.375 G IVPB
3.3750 g | Freq: Three times a day (TID) | INTRAVENOUS | Status: AC
  Administered 2019-10-13 – 2019-10-18 (×15): 3.375 g via INTRAVENOUS
  Filled 2019-10-13 (×15): qty 50

## 2019-10-13 NOTE — Progress Notes (Signed)
Patient somewhat depressed neurologically this morning.  He will still awaken and follow commands.  His work of breathing is increasing.  His O2 sats are declining.  He is currently afebrile.  Heart rate and blood pressure is stable.  O2 sats are 97 with supplemental O2.  He awakens easily.  He answers questions appropriately.  He has a dense right-sided facial nerve palsy and some asymmetry of his palate worrisome for lower cranial nerve dysfunction.  Motor and sensory function of the extremities are stable.  Wound is clean and dry.  Patient at high risk of continued aspiration secondary to cranial nerve dysfunction.  Appreciate critical care's involvement.  Patient may eventually require airway control and mechanical ventilation.  Worrisome chest x-ray for aspiration pneumonia.  Antibiotics have been started.

## 2019-10-13 NOTE — Progress Notes (Signed)
0500: patient became increasingly agitated and refused to be placed back on HFNC. Patient oxygen saturation dropped into the mid 70s. Patient was placed in mittens and  bilateral wrist restraints and was placed back HFNC and neurosurgery was notified of patients status.

## 2019-10-13 NOTE — Consult Note (Signed)
NAME:  Casey Richard, MRN:  [id number], DOB:  07-Jun-1956, LOS: 48 ADMISSION DATE:  09/27/2019, CONSULTATION DATE:  10/13/2019 REFERRING MD:  Dr. Zada Finders, CHIEF COMPLAINT:  Acute Hypoxic Respiratory Distress   History of present illness   64 year old male presents to ED on 4/9 with gait instability, memory loss, and falls. CT Head with hypodense lesion with mass effect on pons, right cerebellum. Treated with steroids. 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 On 4/22 patient with lethargy. Taken for CT head which revealed post-operative hemorrhage with brainstem compression. Taken to OR for evacuation. 4/24 patient with acute hypoxic respiratory distress. CXR with bibasilar opacities with right greater then left, concerning for aspiration. PCCM consulted.   Past Medical History  Heart Block s/p Pacemakers   Significant Hospital Events   4/9 > Admit 4/19> Resection  4/22 > Evacuation of hemorrhage  4/25 > PCCM Consulted   Consults:  PCCM  Procedures:  Left A-Line 4/22 >>  Significant Diagnostic Tests:  CT Head 4/09 > 1. No acute intracranial or calvarial findings. 2. 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 Cystic neoplasm less likely. Recommend further evaluation with MRI of the brain without and with contrast (if possible; the patient has a pacemaker) and non emergent neuro surgical consultation. 3. No evidence of acute cervical spine fracture, traumatic subluxation or static signs of instability. 4. Mildly progressive cervical spondylosis MR Brain 4/12 > 1. 4.7 x 3.4 x 4.1 cm multi-cystic right cerebellopontine angle mass with associated abnormal enhancement extending into the right internal auditory canal. 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 Significant associated mass effect upon the right cerebellopontine and cerebellomedullary angle with partial effacement of the fourth ventricle and mild adjacent parenchymal edema. No hydrocephalus. 2. A 17 x 14 mm hypoenhancing mass within the left aspect of the sella has increased in size as compared MRI 02/15/2012. 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 Correlate with relevant laboratory values. 3. Mild chronic small vessel ischemic, progressed as compared to 02/15/2012. 4. Mild ethmoid sinus mucosal thickening. Small bilateral maxillary sinus mucous retention cysts. CT Head 4/22 > Right occipital craniectomy and cranioplasty related 2 debulking surgery for cystic mass of the right CP angle region. Gross measurements of the lesion today are 3.2 x 5.3 x 4.5 cm. Internal hemorrhage within the residual cystic lesion. Mass-effect upon the fourth ventricle with subsequent enlargement of the lateral and third ventricles consistent with mild hydrocephalus. Small amount of blood layering dependently in the occipital horns. Small amount of subdural blood along the inferior tentorium as well as dissecting inferiorly to and through the foramen magnum to the C1 level with mild mass effect upon the brainstem. CXR 4//24 > 1. Bibasilar opacities, right greater than left may represent aspiration or pneumonia. There may be associated effusion on the right. Recommend clinical correlation and short-term follow-up imaging to ensure resolution. 2. No other acute abnormalities.  Micro Data:  Sputum 4/25 >> Blood 4/25 >>   Antimicrobials:  Zosyn 4/25 >>  Interim history/subjective:  As above   Objective   Blood pressure 128/67, pulse 95, temperature 98.5 F (36.9 C), temperature source Axillary, resp. rate 17, height 5\' 10"  (1.778 m), weight 67.9 kg, SpO2 96 %.  Intake/Output Summary  (Last 24 hours) at 10/13/2019 0940 Last data filed at 10/13/2019 0800 Gross per 24 hour  Intake 4688.06 ml  Output 500 ml  Net 4188.06 ml   Filed Weights   10/07/19 0941 10/12/19 0500 10/13/19 0500  Weight: 68 kg 67.9 kg 67.9 kg    Examination: General: Adult male, no distress  HENT: Dry MM, Tape to Right eye lip  Lungs: Coarse breath sounds, diminished to bases  Cardiovascular: Paced  Abdomen: Soft, non-tender, active bowel sounds  Extremities: -edema  Neuro: Right side facial droop, pupils intact, follows commands, oriented GU: intact, condom cath in place   Resolved Hospital Problem list     Assessment & Plan:   Acute Hypoxic Respiratory Failure presumed secondary to aspiration PNA Plan -Titrate Supplemental Oxygen to Maintain Saturation >92  -PAN Culture, Start Zosyn  -PRN NGT suctioning -Currently protecting airway, weak cough >> monitor for need of intubation   CPA Mass s/p Resection, with hemorrhage s/p evacuation  Plan  -Per Neurosurgery  -Continue Frequent Neuro Checks -Titrate Cardene for systolic goal [id number]   Dysphagia Plan   -Continue TF -Continue to work with ST  H/O Heart Block, V.Fib Arrest 2013 s/p Pacemaker Plan -Cardiac Monitoring   Best practice:  Diet: NPO DVT prophylaxis: SCD GI prophylaxis:  Glucose control: Trend Glucose Mobility: Bedrest  Code Status: Full Code  Family Communication: Will update family  Disposition:   Labs   CBC: Recent Labs  Lab 10/07/19 0423 10/07/19 1242 10/07/19 1402 10/08/19 0441 10/09/19 0501 10/10/19 0436 10/13/19 0900  WBC 21.2*  --   --  23.9* 20.2* 19.7*  --   HGB 13.8   < > 12.6* 13.1 12.9* 13.7 11.2*  HCT 41.1   < > 37.0* 39.4 39.2 42.0 33.0*  MCV 99.0  --   --  98.3 98.7 98.4  --   PLT 184  --   --  168 154 176  --    < > = values in this interval not displayed.    Basic Metabolic Panel: Recent Labs  Lab 10/07/19 0423 10/07/19 1242 10/07/19 1402 10/08/19 0441 10/09/19 0501  10/10/19 0436 10/10/19 0904 10/12/19 0423 10/12/19 1634 10/13/19 0531 10/13/19 0900  NA 137 133* 137  --   --   --  136  --   --   --  145  K 4.7 4.7 4.7  --   --   --  4.2  --   --   --  4.0  CL 97*  --  104  --   --   --  97*  --   --   --   --   CO2 30  --   --   --   --   --  32  --   --   --   --   GLUCOSE 134*  --  121*  --   --   --  125*  --   --   --   --   BUN 35*  --  30*  --   --   --  32*  --   --   --   --   CREATININE 0.88  --  0.70  --   --   --  0.70  --   --   --   --   CALCIUM 8.8*  --   --   --   --   --  8.6*  --   --   --   --  MG 2.2  --   --    < > 2.2 2.2  --  2.1 2.2 2.1  --   PHOS  --   --   --   --   --   --   --  2.2* 2.0* 1.8*  --    < > = values in this interval not displayed.   GFR: Estimated Creatinine Clearance: 88.4 mL/min (by C-G formula based on SCr of 0.7 mg/dL). Recent Labs  Lab 10/07/19 0423 10/08/19 0441 10/09/19 0501 10/10/19 0436  WBC 21.2* 23.9* 20.2* 19.7*    Liver Function Tests: Recent Labs  Lab 10/10/19 0904  AST 23  ALT 99*  ALKPHOS 52  BILITOT 1.0  PROT 6.3*  ALBUMIN 3.4*   No results for input(s): LIPASE, AMYLASE in the last 168 hours. Recent Labs  Lab 10/10/19 0904  AMMONIA 24    ABG    Component Value Date/Time   PHART 7.458 (H) 10/13/2019 0900   PCO2ART 37.0 10/13/2019 0900   PO2ART 69 (L) 10/13/2019 0900   HCO3 26.2 10/13/2019 0900   TCO2 27 10/13/2019 0900   ACIDBASEDEF 5.0 (H) 07/11/2011 0902   O2SAT 95.0 10/13/2019 0900     Coagulation Profile: No results for input(s): INR, PROTIME in the last 168 hours.  Cardiac Enzymes: No results for input(s): CKTOTAL, CKMB, CKMBINDEX, TROPONINI in the last 168 hours.  HbA1C: No results found for: HGBA1C  CBG: Recent Labs  Lab 10/12/19 1556 10/12/19 1923 10/12/19 2304 10/13/19 0307 10/13/19 0833  GLUCAP 126* 139* 137* 131* 113*    Review of Systems:   +Cough with sputum production. Denies headache. Denies visual changes   Past Medical  History  He,  has a past medical history of Cardiac arrest ([place]), CHB (complete heart block) ([place]), Chronic kidney disease, Dysrhythmia (1/13), Memory loss, short term, Nonsustained ventricular tachycardia ([place]) (02/07/2014), Presence of permanent cardiac pacemaker (07/15/2011), Seasonal allergies, and Shortness of breath.   Surgical History    Past Surgical History:  Procedure Laterality Date  . CARDIAC CATHETERIZATION  07/09/2011   Normal coronaries  . CRANIOTOMY Right 10/07/2019   Procedure: RIGHT CRANIOTOMY TUMOR EXCISION;  Surgeon: Judith Part, MD;  Location: [facility];  Service: Neurosurgery;  Laterality: Right;  . CRANIOTOMY Right 10/10/2019   Procedure: CRANIOTOMY FOR EVACUATION OF HEMATOMA;  Surgeon: Judith Part, MD;  Location: [facility];  Service: Neurosurgery;  Laterality: Right;  CRANIOTOMY TUMOR EXCISION  . CYSTOSCOPY WITH URETHRAL DILATATION  08/19/2011   Procedure: CYSTOSCOPY WITH URETHRAL DILATATION;  Surgeon: Dutch Gray, MD;  Location: [facility];  Service: Urology;  Laterality: N/A;  Balloon Dilation of Urethral Stricture, retro-urethrogram, Suprapubic change   . LEFT HEART CATHETERIZATION WITH CORONARY ANGIOGRAM N/A 07/09/2011   Procedure: LEFT HEART CATHETERIZATION WITH CORONARY ANGIOGRAM;  Surgeon: Jettie Booze, MD;  Location[facility] C[facility];  Service: Cardiovascular;  Laterality: N/A;  . PACEMAKER INSERTION  07/15/11   Medtronic Revo  . PERMANENT PACEMAKER INSERTION Left 07/15/2011   Procedure: PERMANENT PACEMAKER INSERTION;  Surgeon: Sanda Klein, MD;  Location: [facility] CATH LAB;  Service: Cardiovascular;  Laterality: Left;  . TEMPORARY PACEMAKER INSERTION Right 07/09/2011   Procedure: TEMPORARY PACEMAKER INSERTION;  Surgeon: Jettie Booze, MD;  [facility];  Service: Cardiovascular;  Laterality: Right;     Social History   reports that he quit smoking about 30 years ago. His smoking use included cigarettes. He has never used smokeless tobacco. He reports  that he does not drink  alcohol or use drugs.   Family History   His family history includes COPD in his father; Coronary artery disease in his brother and mother.   Allergies No Known Allergies   Home Medications  Prior to Admission medications   Medication Sig Start Date End Date Taking? Authorizing Provider  acetaminophen (TYLENOL) 500 MG tablet Take 1,000 mg by mouth every 6 (six) hours as needed. Pain    Yes [provider]  Cholecalciferol (HM VITAMIN D3) 4000 units CAPS Take 1 capsule by mouth daily.   Yes [provider]  cyanocobalamin 2000 MCG tablet Take 2,000 mcg by mouth daily.   Yes [provider]  Multiple Vitamin (MULITIVITAMIN WITH MINERALS) TABS Take 1 tablet by mouth daily.    Yes [provider]     Critical care time: [place], AGACNP-BC [facility]  Pgr: [phone]  PCCM Pgr: [phone]

## 2019-10-13 NOTE — Progress Notes (Signed)
eLink Physician-Brief Progress Note Patient Name: Casey Richard DOB: 02/20/1956 MRN: [id number]   Date of Service  10/13/2019  HPI/Events of Note  Progressive decline in respiratory status, increasing oxygen requirement, desaturation, concern regarding ability to protect his airway, Pt is a full code.  eICU Interventions  PCCM ground crew asked to assess patient regarding possible need for intubation for airway protection.        Kerry Kass Carleta Woodrow 10/13/2019, 11:31 PM

## 2019-10-13 NOTE — Progress Notes (Signed)
Pharmacy Antibiotic Note  Casey Richard is a 63 y.o. male admitted on 09/27/2019 with CPA mass s/p resection.  Pharmacy has been consulted for vancomycin and Zosyn dosing for aspiration pneumonia.  CXR bibasilar opacities R>L, febrile with Tm 100.7, WBC 19.2, tracheal aspirate and blood cultures pending. Renal function is normal.  Plan: Vancomycin 1250 mg IV load then 1000 mg IV q12h Goal AUC 400-550. Expected AUC: 491.2 SCr used: 0.8 Zosyn 3.375 g IV q8h to be infused over 4 hours Monitor renal function, clinical progress, cultures/sensitivities F/U LOT and de-escalate as able Vancomycin levels as clinically indicated   Height: 5\' 10"  (177.8 cm) Weight: 67.9 kg (149 lb 11.1 oz) IBW/kg (Calculated) : 73  Temp (24hrs), Avg:99.3 F (37.4 C), Min:98.5 F (36.9 C), Max:100.7 F (38.2 C)  Recent Labs  Lab 10/07/19 0423 10/07/19 1402 10/08/19 0441 10/09/19 0501 10/10/19 0436 10/10/19 0904 10/13/19 0953  WBC 21.2*  --  23.9* 20.2* 19.7*  --  19.2*  CREATININE 0.88 0.70  --   --   --  0.70  --   LATICACIDVEN  --   --   --   --   --   --  0.6    Estimated Creatinine Clearance: 88.4 mL/min (by C-G formula based on SCr of 0.7 mg/dL).    No Known Allergies  Antimicrobials this admission: Zosyn 4/25>> Vancomycin 4/25 >>  Dose adjustments this admission:   Microbiology results: 4/18 MRSA PCR neg 4/25 TA:  4/25 BCx:  Thank you for involving pharmacy in this patient's care.  Renold Genta, PharmD, BCPS Clinical Pharmacist Clinical phone for 10/13/2019 until 3p is [phone] 10/13/2019 10:51 AM  **Pharmacist phone directory can be found on [email or web address] listed under [place]**

## 2019-10-14 ENCOUNTER — Inpatient Hospital Stay (HOSPITAL_COMMUNITY): Payer: Medicare Other

## 2019-10-14 DIAGNOSIS — J9601 Acute respiratory failure with hypoxia: Secondary | ICD-10-CM | POA: Diagnosis not present

## 2019-10-14 LAB — CBC
HCT: 34.5 % — ABNORMAL LOW (ref 39.0–52.0)
Hemoglobin: 11.4 g/dL — ABNORMAL LOW (ref 13.0–17.0)
MCH: 32.7 pg (ref 26.0–34.0)
MCHC: 33 g/dL (ref 30.0–36.0)
MCV: 98.9 fL (ref 80.0–100.0)
Platelets: 187 10*3/uL (ref 150–400)
RBC: 3.49 MIL/uL — ABNORMAL LOW (ref 4.22–5.81)
RDW: 13.9 % (ref 11.5–15.5)
WBC: 18.4 10*3/uL — ABNORMAL HIGH (ref 4.0–10.5)
nRBC: 0 % (ref 0.0–0.2)

## 2019-10-14 LAB — POCT I-STAT 7, (LYTES, BLD GAS, ICA,H+H)
Acid-Base Excess: 1 mmol/L (ref 0.0–2.0)
Acid-Base Excess: 1 mmol/L (ref 0.0–2.0)
Acid-base deficit: 1 mmol/L (ref 0.0–2.0)
Bicarbonate: 23.5 mmol/L (ref 20.0–28.0)
Bicarbonate: 23.7 mmol/L (ref 20.0–28.0)
Bicarbonate: 23 mmol/L (ref 20.0–28.0)
Calcium, Ion: 1.19 mmol/L (ref 1.15–1.40)
Calcium, Ion: 1.17 mmol/L (ref 1.15–1.40)
Calcium, Ion: 1.19 mmol/L (ref 1.15–1.40)
HCT: 32 % — ABNORMAL LOW (ref 39.0–52.0)
HCT: 31 % — ABNORMAL LOW (ref 39.0–52.0)
HCT: 31 % — ABNORMAL LOW (ref 39.0–52.0)
Hemoglobin: 10.9 g/dL — ABNORMAL LOW (ref 13.0–17.0)
Hemoglobin: 10.5 g/dL — ABNORMAL LOW (ref 13.0–17.0)
Hemoglobin: 10.5 g/dL — ABNORMAL LOW (ref 13.0–17.0)
O2 Saturation: 92 %
O2 Saturation: 96 %
O2 Saturation: 98 %
Patient temperature: 100.1
Patient temperature: 99.8
Patient temperature: 100.8
Potassium: 4 mmol/L (ref 3.5–5.1)
Potassium: 3.7 mmol/L (ref 3.5–5.1)
Potassium: 3.6 mmol/L (ref 3.5–5.1)
Sodium: 145 mmol/L (ref 135–145)
Sodium: 147 mmol/L — ABNORMAL HIGH (ref 135–145)
Sodium: 147 mmol/L — ABNORMAL HIGH (ref 135–145)
TCO2: 24 mmol/L (ref 22–32)
TCO2: 25 mmol/L (ref 22–32)
TCO2: 24 mmol/L (ref 22–32)
pCO2 arterial: 30.9 mmHg — ABNORMAL LOW (ref 32.0–48.0)
pCO2 arterial: 32.5 mmHg (ref 32.0–48.0)
pCO2 arterial: 35.5 mmHg (ref 32.0–48.0)
pH, Arterial: 7.493 — ABNORMAL HIGH (ref 7.350–7.450)
pH, Arterial: 7.473 — ABNORMAL HIGH (ref 7.350–7.450)
pH, Arterial: 7.424 (ref 7.350–7.450)
pO2, Arterial: 59 mmHg — ABNORMAL LOW (ref 83.0–108.0)
pO2, Arterial: 80 mmHg — ABNORMAL LOW (ref 83.0–108.0)
pO2, Arterial: 102 mmHg (ref 83.0–108.0)

## 2019-10-14 LAB — GLUCOSE, CAPILLARY
Glucose-Capillary: 123 mg/dL — ABNORMAL HIGH (ref 70–99)
Glucose-Capillary: 103 mg/dL — ABNORMAL HIGH (ref 70–99)
Glucose-Capillary: 111 mg/dL — ABNORMAL HIGH (ref 70–99)
Glucose-Capillary: 106 mg/dL — ABNORMAL HIGH (ref 70–99)
Glucose-Capillary: 141 mg/dL — ABNORMAL HIGH (ref 70–99)
Glucose-Capillary: 130 mg/dL — ABNORMAL HIGH (ref 70–99)
Glucose-Capillary: 136 mg/dL — ABNORMAL HIGH (ref 70–99)

## 2019-10-14 LAB — BASIC METABOLIC PANEL
Anion gap: 9 (ref 5–15)
BUN: 37 mg/dL — ABNORMAL HIGH (ref 8–23)
CO2: 22 mmol/L (ref 22–32)
Calcium: 8.1 mg/dL — ABNORMAL LOW (ref 8.9–10.3)
Chloride: 114 mmol/L — ABNORMAL HIGH (ref 98–111)
Creatinine, Ser: 0.79 mg/dL (ref 0.61–1.24)
GFR calc Af Amer: >60 mL/min (ref >60)
GFR calc non Af Amer: >60 mL/min (ref >60)
Glucose, Bld: 126 mg/dL — ABNORMAL HIGH (ref 70–99)
Potassium: 3.7 mmol/L (ref 3.5–5.1)
Sodium: 145 mmol/L (ref 135–145)

## 2019-10-14 MED ORDER — MIDAZOLAM HCL 2 MG/2ML IJ SOLN: 5.0000 mg | Freq: Once | INTRAMUSCULAR | Status: DC

## 2019-10-14 MED ORDER — FENTANYL CITRATE (PF) 100 MCG/2ML IJ SOLN: 200.0000 ug | Freq: Once | INTRAMUSCULAR | Status: DC

## 2019-10-14 MED ORDER — VECURONIUM BROMIDE 10 MG IV SOLR: 10.0000 mg | Freq: Once | INTRAVENOUS | Status: DC

## 2019-10-14 MED ORDER — DEXMEDETOMIDINE HCL IN NACL 400 MCG/100ML IV SOLN
0.4000 ug/kg/h | INTRAVENOUS | Status: DC
  Administered 2019-10-14: 0.5 ug/kg/h via INTRAVENOUS
  Administered 2019-10-14: 0.4 ug/kg/h via INTRAVENOUS
  Administered 2019-10-15 (×2): 0.5 ug/kg/h via INTRAVENOUS
  Administered 2019-10-16: 0.4 ug/kg/h via INTRAVENOUS
  Filled 2019-10-14 (×5): qty 100

## 2019-10-14 MED ORDER — MIDAZOLAM HCL 2 MG/2ML IJ SOLN
INTRAMUSCULAR | Status: AC
  Administered 2019-10-14: 2 mg
  Filled 2019-10-14: qty 2

## 2019-10-14 MED ORDER — ETOMIDATE 2 MG/ML IV SOLN
20.0000 mg | Freq: Once | INTRAVENOUS | Status: AC
  Administered 2019-10-14: 20 mg via INTRAVENOUS

## 2019-10-14 MED ORDER — ROCURONIUM BROMIDE 50 MG/5ML IV SOLN
80.0000 mg | Freq: Once | INTRAVENOUS | Status: AC
  Administered 2019-10-14: 80 mg via INTRAVENOUS

## 2019-10-14 MED ORDER — CHLORHEXIDINE GLUCONATE 0.12% ORAL RINSE (MEDLINE KIT)
15.0000 mL | Freq: Two times a day (BID) | OROMUCOSAL | Status: DC
  Administered 2019-10-14 – 2019-10-18 (×9): 15 mL via OROMUCOSAL

## 2019-10-14 MED ORDER — BETHANECHOL CHLORIDE 10 MG PO TABS
10.0000 mg | ORAL_TABLET | Freq: Three times a day (TID) | ORAL | Status: DC
  Administered 2019-10-14 – 2019-11-15 (×93): 10 mg
  Filled 2019-10-14 (×94): qty 1

## 2019-10-14 MED ORDER — FENTANYL CITRATE (PF) 100 MCG/2ML IJ SOLN
INTRAMUSCULAR | Status: AC
  Administered 2019-10-14: 100 ug
  Filled 2019-10-14: qty 2

## 2019-10-14 MED ORDER — PROPOFOL 10 MG/ML IV BOLUS: 1.0000 mg/kg | Freq: Once | INTRAVENOUS | Status: DC

## 2019-10-14 MED ORDER — ORAL CARE MOUTH RINSE
15.0000 mL | OROMUCOSAL | Status: DC
  Administered 2019-10-14 – 2019-10-19 (×47): 15 mL via OROMUCOSAL

## 2019-10-14 NOTE — Progress Notes (Signed)
PT Cancellation Note  Patient Details Name: DONTAYE HRABIK MRN: [id number] DOB: [birth year]   Cancelled Treatment:    Reason Eval/Treat Not Completed: Medical issues which prohibited therapy.  See OT note.  PT to follow along for medical stability.  Thanks,  Verdene Lennert, PT, DPT  Acute Rehabilitation [phone] pager #[phone] office       Barbarann Ehlers Aadit Hagood 10/14/2019, 10:38 AM

## 2019-10-14 NOTE — Consult Note (Signed)
NAME:  Casey Richard, MRN:  [id number], DOB:  Apr 04, 1956, LOS: 20 ADMISSION DATE:  09/27/2019, CONSULTATION DATE:  10/13/2019 REFERRING MD:  Dr. Zada Finders, CHIEF COMPLAINT:  Acute Hypoxic Respiratory Distress   History of present illness   64 year old male presents to ED on 4/9 with gait instability, memory loss, and falls. CT Head with hypodense lesion with mass effect on pons, right cerebellum. Treated with steroids. 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 On 4/22 patient with lethargy. Taken for CT head which revealed post-operative hemorrhage with brainstem compression. Taken to OR for evacuation. 4/24 patient with acute hypoxic respiratory distress. CXR with bibasilar opacities with right greater then left, concerning for aspiration. PCCM consulted.   Past Medical History  Heart Block s/p Pacemakers   Significant Hospital Events   4/9 > Admit 4/19> Resection  4/22 > Evacuation of hemorrhage  4/25 > PCCM Consulted   Consults:  PCCM  Procedures:  Left A-Line 4/22 >>  Significant Diagnostic Tests:  CT Head 4/09 > 1. No acute intracranial or calvarial findings. 2. 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 Cystic neoplasm less likely. Recommend further evaluation with MRI of the brain without and with contrast (if possible; the patient has a pacemaker) and non emergent neuro surgical consultation. 3. No evidence of acute cervical spine fracture, traumatic subluxation or static signs of instability. 4. Mildly progressive cervical spondylosis MR Brain 4/12 > 1. 4.7 x 3.4 x 4.1 cm multi-cystic right cerebellopontine angle mass with associated abnormal enhancement extending into the right internal auditory canal. 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 Significant associated mass effect upon the right cerebellopontine and cerebellomedullary angle with partial effacement of the fourth ventricle and mild adjacent parenchymal edema. No hydrocephalus. 2. A 17 x 14 mm hypoenhancing mass within the left aspect of the sella has increased in size as compared MRI 02/15/2012. 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 Correlate with relevant laboratory values. 3. Mild chronic small vessel ischemic, progressed as compared to 02/15/2012. 4. Mild ethmoid sinus mucosal thickening. Small bilateral maxillary sinus mucous retention cysts. CT Head 4/22 > Right occipital craniectomy and cranioplasty related 2 debulking surgery for cystic mass of the right CP angle region. Gross measurements of the lesion today are 3.2 x 5.3 x 4.5 cm. Internal hemorrhage within the residual cystic lesion. Mass-effect upon the fourth ventricle with subsequent enlargement of the lateral and third ventricles consistent with mild hydrocephalus. Small amount of blood layering dependently in the occipital horns. Small amount of subdural blood along the inferior tentorium as well as dissecting inferiorly to and through the foramen magnum to the C1 level with mild mass effect upon the brainstem. CXR 4//24 > 1. Bibasilar opacities, right greater than left may represent aspiration or pneumonia. There may be associated effusion on the right. Recommend clinical correlation and short-term follow-up imaging to ensure resolution. 2. No other acute abnormalities.  Micro Data:  Sputum 4/25 >> Blood 4/25 >>   Antimicrobials:  Zosyn 4/25 >>  Interim history/subjective:  Ongoing poor mental status with high oxygen requirements. Poor cough. Discussed with Dr Zada Finders who felt that the patient has a reasonable long-term prognosis.   Objective   Blood pressure 106/72, pulse 94,  temperature (!) 100.8 F (38.2 C), temperature source Axillary, resp. rate 17, height 5\' 10"  (1.778 m), weight 67.9 kg, SpO2 100 %.    Vent Mode: PRVC FiO2 (%):  [90 %-100 %] 90 % Set Rate:  [16 bmp] 16 bmp Vt Set:  [580 mL] 580 mL PEEP:  [5 cmH20] 5 cmH20 Plateau Pressure:  [14 cmH20-18 cmH20] 14 cmH20   Intake/Output Summary (Last 24 hours) at 10/14/2019 1118 Last data filed at 10/14/2019 1100 Gross per 24 hour  Intake 5690.28 ml  Output 2450 ml  Net 3240.28 ml   Filed Weights   10/07/19 0941 10/12/19 0500 10/13/19 0500  Weight: 68 kg 67.9 kg 67.9 kg    Examination: General: Adult male, mild distress on HFNC HENT: Dry MM,  Lungs: Coarse breath sounds, diminished to bases  Cardiovascular: Paced - HS normal Abdomen: Soft, non-tendrr, active bowel sounds  Extremities: no edema  Neuro: Right side facial droop, pupils intact, follows commands, oriented GU: intact, condom cath in place   Resolved Hospital Problem list     Assessment & Plan:   Critically ill due to Acute Hypoxic Respiratory Failure presumed secondary to aspiration PNA - now requiring HFNC Location of tumor/bleed likely affecting pharyngeal function.  Very tenuous respiratory reserve  -Intubation.  -Tracheostomy to protect airway and facilitate recovery.   CPA Mass s/p Resection, with hemorrhage s/p evacuation  -For repeat scan today. 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 Respiratory support goals: Full support. Pulmonary toilette. Wean to PSV as tolerate and will discuss tracheostomy with family. Blood pressure target: Cardene to keep SBP 120-160 DVT prophylaxis: Heparin tid - will  discuss with NRSx Nutrition Status: Nutrition Problem: Severe Malnutrition Etiology: chronic illness Signs/Symptoms: severe muscle depletion, severe fat depletion, percent weight loss, energy intake < or equal to 75% for > or equal to 1 month Percent weight loss: 22 % Interventions: Ensure Enlive (each supplement provides 350kcal and 20 grams of protein), Magic cup, MVI, Liberalize Diet GI prophylaxis:  Famotidine Fluid status goals: negative fluid balance. Allow autoregulation. Urinary catheter: Assessment of intravascular volume and urinary retention. Will start Ureocholine Central line: arterial line.  Glucose control: euglycemic on no therapy.  Mobility/therapy needs: PT/OT/SLP once trach in place Antibiotic de-escalation: Zosyn for 5 days for aspiration pneumonia. Home medication reconciliation: no relevant medications.  Daily labs: CBC, BNP Code Status: Full  Family Communication: Updated brother who has consented to tracheostomy tomorrow. Disposition: ICU    Labs   CBC: Recent Labs  Lab 10/08/19 0441 10/08/19 0441 10/09/19 0501 10/09/19 0501 10/10/19 0436 10/13/19 0900 10/13/19 0953 10/13/19 0953 10/13/19 2352 10/14/19 0109 10/14/19 0431 10/14/19 0626 10/14/19 0930  WBC 23.9*  --  20.2*  --  19.7*  --  19.2*  --   --   --  18.4*  --   --   HGB 13.1   < > 12.9*   < > 13.7   < > 12.0*   < > 10.9* 10.9* 11.4* 10.5* 10.5*  HCT 39.4   < > 39.2   < > 42.0   < > 36.2*   < > 32.0* 32.0* 34.5* 31.0* 31.0*  MCV 98.3  --  98.7  --  98.4  --  100.6*  --   --   --  98.9  --   --   PLT 168  --  154  --  176  --  161  --   --   --  187  --   --    < > = values in this interval not displayed.    Basic Metabolic Panel: Recent Labs  Lab 10/07/19 1402 10/07/19 1402 10/08/19 0441 10/10/19 0436 10/10/19 0904 10/12/19 0423 10/12/19 1634 10/13/19 0531 10/13/19 0900 10/13/19 0953 10/13/19 0953 10/13/19 1700 10/13/19 2352 10/14/19 0109 10/14/19 0431 10/14/19 0626  10/14/19 0930  NA 137   < >  --   --  136  --   --   --    < > 144   < >  --  145 145 145 147* 147*  K 4.7   < >  --   --  4.2  --   --   --    < > 4.1   < >  --  4.0 4.0 3.7 3.7 3.6  CL 104  --   --   --  97*  --   --   --   --  112*  --   --   --   --  114*  --   --   CO2  --   --   --   --  32  --   --   --   --  23  --   --   --   --  22  --   --   GLUCOSE 121*  --   --   --  125*  --   --   --   --  119*  --   --   --   --  126*  --   --   BUN 30*  --   --   --  32*  --   --   --   --  35*  --   --   --   --  37*  --   --   CREATININE 0.70  --   --   --  0.70  --   --   --   --  0.56*  --   --   --   --  0.79  --   --   CALCIUM  --   --   --   --  8.6*  --   --   --   --  8.3*  --   --   --   --  8.1*  --   --   MG  --   --    < > 2.2  --  2.1 2.2 2.1  --   --   --  2.0  --   --   --   --   --   PHOS  --   --   --   --   --  2.2* 2.0* 1.8*  --   --   --  3.2  --   --   --   --   --    < > = values in this interval not displayed.   GFR: Estimated Creatinine Clearance: 88.4 mL/min (by C-G formula based on SCr of 0.79 mg/dL). Recent Labs  Lab 10/09/19 0501 10/10/19 0436 10/13/19 0953 10/14/19 0431  PROCALCITON  --   --  0.30  --  WBC 20.2* 19.7* 19.2* 18.4*  LATICACIDVEN  --   --  0.6  --     Liver Function Tests: Recent Labs  Lab 10/10/19 0904 10/13/19 0953  AST 23 25  ALT 99* 48*  ALKPHOS 52 63  BILITOT 1.0 1.2  PROT 6.3* 5.4*  ALBUMIN 3.4* 2.5*   No results for input(s): LIPASE, AMYLASE in the last 168 hours. Recent Labs  Lab 10/10/19 0904  AMMONIA 24    ABG    Component Value Date/Time   PHART 7.424 10/14/2019 0930   PCO2ART 35.5 10/14/2019 0930   PO2ART 102 10/14/2019 0930   HCO3 23.0 10/14/2019 0930   TCO2 24 10/14/2019 0930   ACIDBASEDEF 1.0 10/14/2019 0930   O2SAT 98.0 10/14/2019 0930     Coagulation Profile: No results for input(s): INR, PROTIME in the last 168 hours.  Cardiac Enzymes: No results for input(s): CKTOTAL, CKMB, CKMBINDEX,  TROPONINI in the last 168 hours.  HbA1C: No results found for: HGBA1C  CBG: Recent Labs  Lab 10/13/19 2317 10/14/19 0308 10/14/19 0626 10/14/19 0732 10/14/19 1105  GLUCAP 130* 123* 103* 111* 106*   CRITICAL CARE Performed by: Kipp Brood   Total critical care time: 40 minutes  Critical care time was exclusive of separately billable procedures and treating other patients.  Critical care was necessary to treat or prevent imminent or life-threatening deterioration.  Critical care was time spent personally by me on the following activities: development of treatment plan with patient and/or surrogate as well as nursing, discussions with consultants, evaluation of patient's response to treatment, examination of patient, obtaining history from patient or surrogate, ordering and performing treatments and interventions, ordering and review of laboratory studies, ordering and review of radiographic studies, pulse oximetry, re-evaluation of patient's condition and participation in multidisciplinary rounds.  Kipp Brood, MD [facility] ICU Physician [place]  Pager: [phone] Mobile: [phone] After hours: [phone].

## 2019-10-14 NOTE — Progress Notes (Signed)
Neurosurgery Service Progress Note  Subjective: Hypoxemia overnight with likely resulting confusion / agitation, no other issues  Objective: Vitals:   10/14/19 0500 10/14/19 0600 10/14/19 0700 10/14/19 0743  BP: 127/75 (!) 148/84 (!) 168/88   Pulse: (!) 105 (!) 108 (!) 108 (!) 116  Resp: 14 20 17 19   Temp:      TempSrc:      SpO2: 98% 94% 100% 100%  Weight:      Height:       Temp (24hrs), Avg:99.3 F (37.4 C), Min:98.3 F (36.8 C), Max:100.3 F (37.9 C)  CBC Latest Ref Rng & Units 10/14/2019 10/14/2019 10/14/2019  WBC 4.0 - 10.5 K/uL - 18.4(H) -  Hemoglobin 13.0 - 17.0 g/dL 10.5(L) 11.4(L) 10.9(L)  Hematocrit 39.0 - 52.0 % 31.0(L) 34.5(L) 32.0(L)  Platelets 150 - 400 K/uL - 187 -   BMP Latest Ref Rng & Units 10/14/2019 10/14/2019 10/14/2019  Glucose 70 - 99 mg/dL - 126(H) -  BUN 8 - 23 mg/dL - 37(H) -  Creatinine 0.61 - 1.24 mg/dL - 0.79 -  Sodium 135 - 145 mmol/L 147(H) 145 145  Potassium 3.5 - 5.1 mmol/L 3.7 3.7 4.0  Chloride 98 - 111 mmol/L - 114(H) -  CO2 22 - 32 mmol/L - 22 -  Calcium 8.9 - 10.3 mg/dL - 8.1(L) -    Intake/Output Summary (Last 24 hours) at 10/14/2019 0809 Last data filed at 10/14/2019 0700 Gross per 24 hour  Intake 5553.9 ml  Output 1625 ml  Net 3928.9 ml    Current Facility-Administered Medications:  .  0.9 %  sodium chloride infusion, , Intravenous, PRN, Judith Part, MD, Last Rate: 5 mL/hr at 10/14/19 0700, Rate Verify at 10/14/19 0700 .  0.9 %  sodium chloride infusion, , Intravenous, Continuous, Tiffnay Bossi, Joyice Faster, MD, Last Rate: 75 mL/hr at 10/14/19 0700, Rate Verify at 10/14/19 0700 .  acetaminophen (TYLENOL) 160 MG/5ML solution 650 mg, 650 mg, Per Tube, Q4H PRN, Ashok Pall, MD, 650 mg at 10/13/19 [id number] .  [DISCONTINUED] acetaminophen (TYLENOL) tablet 650 mg, 650 mg, Oral, Q4H PRN **OR** acetaminophen (TYLENOL) suppository 650 mg, 650 mg, Rectal, Q4H PRN, Christean Silvestri A, MD .  amLODipine (NORVASC) tablet 5 mg, 5 mg, Per Tube,  Daily, Hayden Pedro M, NP, 5 mg at 10/13/19 1316 .  chlorhexidine (PERIDEX) 0.12 % solution 15 mL, 15 mL, Mouth Rinse, BID, Baylei Siebels, Joyice Faster, MD, 15 mL at 10/13/19 2121 .  Chlorhexidine Gluconate Cloth 2 % PADS 6 each, 6 each, Topical, Daily, Judith Part, MD, 6 each at 10/13/19 1230 .  docusate (COLACE) 50 MG/5ML liquid 100 mg, 100 mg, Per Tube, BID, Ashok Pall, MD, Stopped at 10/13/19 1014 .  enalaprilat (VASOTEC) injection 1.25 mg, 1.25 mg, Intravenous, Q6H PRN, Vallarie Mare, MD, 1.25 mg at 10/12/19 0232 .  feeding supplement (OSMOLITE 1.2 CAL) liquid 1,000 mL, 1,000 mL, Per Tube, Continuous, Issis Lindseth, Joyice Faster, MD, Stopped at 10/14/19 0100 .  feeding supplement (PRO-STAT SUGAR FREE 64) liquid 30 mL, 30 mL, Per Tube, Daily, Eilam Shrewsbury A, MD, 30 mL at 10/12/19 1100 .  HYDROcodone-acetaminophen (NORCO/VICODIN) 5-325 MG per tablet 1 tablet, 1 tablet, Per Tube, Q4H PRN, Ashok Pall, MD .  HYDROmorphone (DILAUDID) injection 0.5 mg, 0.5 mg, Intravenous, Q3H PRN, Judith Part, MD, 0.5 mg at 10/11/19 0336 .  hydroxypropyl methylcellulose / hypromellose (ISOPTO TEARS / GONIOVISC) 2.5 % ophthalmic solution 1 drop, 1 drop, Right Eye, Q2H, Tyese Finken, Joyice Faster, MD, 1 drop at  10/14/19 [id number] .  labetalol (NORMODYNE) injection 10-40 mg, 10-40 mg, Intravenous, Q10 min PRN, Judith Part, MD, 20 mg at 10/14/19 0131 .  MEDLINE mouth rinse, 15 mL, Mouth Rinse, q12n4p, Wilferd Ritson, Joyice Faster, MD, 15 mL at 10/13/19 1611 .  nicardipine (CARDENE) 20mg  in 0.86% saline 253ml IV infusion (0.1 mg/ml), 3-15 mg/hr, Intravenous, Titrated, Vallarie Mare, MD, Last Rate: 75 mL/hr at 10/14/19 0700, 7.5 mg/hr at 10/14/19 0700 .  ondansetron (ZOFRAN) tablet 4 mg, 4 mg, Oral, Q4H PRN **OR** ondansetron (ZOFRAN) injection 4 mg, 4 mg, Intravenous, Q4H PRN, Kodie Kishi A, MD .  piperacillin-tazobactam (ZOSYN) IVPB 3.375 g, 3.375 g, Intravenous, Q8H, [place], Donalynn Furlong, [place]gie, Last Rate:  12.5 mL/hr at 10/14/19 0700, Rate Verify at 10/14/19 0700 .  polyethylene glycol (MIRALAX / GLYCOLAX) packet 17 g, 17 g, Per Tube, Daily PRN, Ashok Pall, MD .  promethazine (PHENERGAN) tablet 12.5-25 mg, 12.5-25 mg, Oral, Q4H PRN, Judith Part, MD .  vancomycin (VANCOCIN) IVPB 1000 mg/200 mL premix, 1,000 mg, Intravenous, Q12H, Alvira Philips, [place], Stopped at 10/14/19 0229   Physical Exam: Somnolent, Ox1, face with HB5 weakness on the R, speech hoarse, R SCM dec'd tone, FCx4  Assessment & Plan: 64 y.o. 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 MRI with good debulking of tumor, pre-post op measurements show decrease in tumor volume from 19.7cc-->4.3cc. 4/22 progressive obtundation, CTH with post-op resection cavity hemorrhage not present on MRI, brainstem compression and developing obstructive hydrocephalus, taken back to OR for hematoma removal and hemostasis. Unfortunately, as expected, post-op cranial nerve dysfunction after hematoma evacuation despite normal intra-op stimulation. Post-op CTH with improved mass effect on brainstem, slowly improving ventricular size with some IVH  -he is clearly aspirating 2/2 poor upper airway control, developing worsening pulmonary infiltrates, I agree with CCM that he will need intubation for airway protection and then tracheostomy w/ PEG placement -eye drops to R eye, tape shut while asleep to protect cornea given facial weakness -will get post-intubation CTH to make sure respiratory depression does not have hydrocephalus component -PEG c/s -final path cystic schwannoma -SCDs/TEDs, hold SQH until POD5 (4/27)  Marcello Moores A Loella Hickle  10/14/19 8:09 AM

## 2019-10-14 NOTE — Progress Notes (Addendum)
PCCM interval progress note:  Paged with critical care attending to evaluate patient for worsening mental status and possible intubation.  Pt has been lethargic and requiring non-reabreather mask.  PO2 52 on ABG.     Pt somnolent and minimally arousable on initial exam, discussed possible intubation with patient's brother who gave consent if needed.  However, pt then became more more awake and interactive.  Opening eyes, following commands and able to state his name.    Currently covered with Zosyn for aspiration.  Oxygen sats 90-92%.  P: -stat CXR -Trial HFNC and then repeat ABG in 1hr -If respiratory or neurologic status worsens with likely need intubated -waxing and waning mental status could be secondary to delirium, consider abscence seizure in the setting of intracranial hemorrhage and resection. Will check EEG -If requires intubation will obtain repeat head CT   Mickel Baas R Gleason, PA-C    Re-evaluated patient after 1 hour of treatment   Satting 93-96% on NRB mask and HHF 100%/30L RR 20-22 Oriented but restless.   ABG pending CXR w +/- elevated R hemidiaphragm  A/P Will continue to temporize with non-invasive, given improvement in mental status will need to consider BIPAP, however if fails from a purely respiratory/hypoxia standpoint would prefer intubation.  -chest PT -NT suctioning NPO, hold TF

## 2019-10-14 NOTE — Progress Notes (Signed)
Patient transported to CT & back on the vent with no problems.

## 2019-10-14 NOTE — Progress Notes (Signed)
SLP Cancellation Note  Patient Details Name: ANTWOINE MCGUFFIE MRN: [id number] DOB: Feb 01, 1956   Cancelled treatment:       Reason Eval/Treat Not Completed: Medical issues which prohibited therapy. SLP will follow.  Deckard Stuber L. Tivis Ringer, [place] Office number [phone] Pager [phone]    Juan Quam Laurice 10/14/2019, 8:14 AM

## 2019-10-14 NOTE — Progress Notes (Signed)
OT Cancellation Note  Patient Details Name: BYSON GAYLORD MRN: [id number] DOB: 11/03/1955   Cancelled Treatment:    Reason Eval/Treat Not Completed: Patient at procedure or test/ unavailable;Medical issues which prohibited therapy(Just re-intubated. Planning for stat CT. RN request hold) Will return as schedule allows.  [place], OTR/L Acute Rehab Pager: [phone] Office: [phone] 10/14/2019, 9:15 AM

## 2019-10-14 NOTE — Progress Notes (Signed)
Per Dr. Calton Dach EEG to be cancelled.

## 2019-10-14 NOTE — Procedures (Signed)
Intubation Procedure Note DORR RASSO [id number] Feb 13, 1956  Procedure: Intubation Indications: Airway protection and maintenance  Procedure Details Consent: Unable to obtain consent because of altered level of consciousness. Time Out: Verified patient identification, verified procedure, site/side was marked, verified correct patient position, special equipment/implants available, medications/allergies/relevent history reviewed, required imaging and test results available.  Performed ETT size #8 placed under laryngoscope visualization.  Evaluation Hemodynamic Status: BP stable throughout; O2 sats: stable throughout Patient's Current Condition: stable Complications: No apparent complications Patient did tolerate procedure well. Chest X-ray ordered to verify placement.  CXR: tube position acceptable.   Mitzi Hansen, MD 10/14/19 1:03 PM

## 2019-10-14 NOTE — Progress Notes (Addendum)
eLink Physician-Brief Progress Note Patient Name: Casey Richard DOB: June 04, 1956 MRN: [id number]   Date of Service  10/14/2019  HPI/Events of Note  Pt pulled his mask off and desaturated into the 80's but saturation is back up to 98 %, however patient is somnolent.  eICU Interventions  Will check ABG to verify oxygenation and ventilation (PCO2), also blood sugar check ordered.        Kerry Kass Lesleyanne Politte 10/14/2019, 6:21 AM

## 2019-10-14 NOTE — Progress Notes (Signed)
Pt agitated, moaning, pulling off lines and armbands, states unable to breathe, desat to 80 and became more lethargic E-link notified.

## 2019-10-15 ENCOUNTER — Inpatient Hospital Stay (HOSPITAL_COMMUNITY): Payer: Medicare Other

## 2019-10-15 DIAGNOSIS — J9601 Acute respiratory failure with hypoxia: Secondary | ICD-10-CM | POA: Diagnosis not present

## 2019-10-15 DIAGNOSIS — Z9911 Dependence on respirator [ventilator] status: Secondary | ICD-10-CM

## 2019-10-15 DIAGNOSIS — G9389 Other specified disorders of brain: Secondary | ICD-10-CM | POA: Diagnosis not present

## 2019-10-15 LAB — CBC
HCT: 30.1 % — ABNORMAL LOW (ref 39.0–52.0)
Hemoglobin: 9.8 g/dL — ABNORMAL LOW (ref 13.0–17.0)
MCH: 33.1 pg (ref 26.0–34.0)
MCHC: 32.6 g/dL (ref 30.0–36.0)
MCV: 101.7 fL — ABNORMAL HIGH (ref 80.0–100.0)
Platelets: 164 10*3/uL (ref 150–400)
RBC: 2.96 MIL/uL — ABNORMAL LOW (ref 4.22–5.81)
RDW: 14 % (ref 11.5–15.5)
WBC: 11.8 10*3/uL — ABNORMAL HIGH (ref 4.0–10.5)
nRBC: 0 % (ref 0.0–0.2)

## 2019-10-15 LAB — BASIC METABOLIC PANEL
Anion gap: 4 — ABNORMAL LOW (ref 5–15)
BUN: 34 mg/dL — ABNORMAL HIGH (ref 8–23)
CO2: 23 mmol/L (ref 22–32)
Calcium: 7.7 mg/dL — ABNORMAL LOW (ref 8.9–10.3)
Chloride: 120 mmol/L — ABNORMAL HIGH (ref 98–111)
Creatinine, Ser: 0.66 mg/dL (ref 0.61–1.24)
GFR calc Af Amer: >60 mL/min (ref >60)
GFR calc non Af Amer: >60 mL/min (ref >60)
Glucose, Bld: 105 mg/dL — ABNORMAL HIGH (ref 70–99)
Potassium: 3.9 mmol/L (ref 3.5–5.1)
Sodium: 147 mmol/L — ABNORMAL HIGH (ref 135–145)

## 2019-10-15 LAB — CULTURE, RESPIRATORY W GRAM STAIN: Culture: NORMAL

## 2019-10-15 LAB — GLUCOSE, CAPILLARY
Glucose-Capillary: 109 mg/dL — ABNORMAL HIGH (ref 70–99)
Glucose-Capillary: 103 mg/dL — ABNORMAL HIGH (ref 70–99)
Glucose-Capillary: 105 mg/dL — ABNORMAL HIGH (ref 70–99)
Glucose-Capillary: 90 mg/dL (ref 70–99)
Glucose-Capillary: 102 mg/dL — ABNORMAL HIGH (ref 70–99)
Glucose-Capillary: 120 mg/dL — ABNORMAL HIGH (ref 70–99)

## 2019-10-15 LAB — PHOSPHORUS: Phosphorus: 3 mg/dL (ref 2.5–4.6)

## 2019-10-15 LAB — MAGNESIUM: Magnesium: 2 mg/dL (ref 1.7–2.4)

## 2019-10-15 MED ORDER — FENTANYL CITRATE (PF) 100 MCG/2ML IJ SOLN: 100.0000 ug | Freq: Once | INTRAMUSCULAR | Status: AC

## 2019-10-15 MED ORDER — PROPOFOL 500 MG/50ML IV EMUL
INTRAVENOUS | Status: AC
  Administered 2019-10-15: 30 mg via INTRAVENOUS
  Filled 2019-10-15: qty 50

## 2019-10-15 MED ORDER — MIDAZOLAM HCL 2 MG/2ML IJ SOLN: 5.0000 mg | Freq: Once | INTRAMUSCULAR | Status: DC

## 2019-10-15 MED ORDER — VECURONIUM BROMIDE 10 MG IV SOLR
INTRAVENOUS | Status: AC
  Administered 2019-10-15: 10 mg via INTRAVENOUS
  Filled 2019-10-15: qty 10

## 2019-10-15 MED ORDER — FENTANYL CITRATE (PF) 100 MCG/2ML IJ SOLN
INTRAMUSCULAR | Status: AC
  Administered 2019-10-15: 100 ug via INTRAVENOUS
  Filled 2019-10-15: qty 2

## 2019-10-15 MED ORDER — MIDAZOLAM HCL 2 MG/2ML IJ SOLN
INTRAMUSCULAR | Status: AC
  Administered 2019-10-15: 5 mg via INTRAVENOUS
  Filled 2019-10-15: qty 6

## 2019-10-15 MED ORDER — PROPOFOL 10 MG/ML IV BOLUS: 30.0000 mg | Freq: Once | INTRAVENOUS | Status: AC

## 2019-10-15 MED ORDER — VECURONIUM BROMIDE 10 MG IV SOLR: 10.0000 mg | Freq: Once | INTRAVENOUS | Status: AC

## 2019-10-15 MED ORDER — HEPARIN SODIUM (PORCINE) 5000 UNIT/ML IJ SOLN
5000.0000 [IU] | Freq: Three times a day (TID) | INTRAMUSCULAR | Status: DC
  Administered 2019-10-15 – 2019-11-15 (×86): 5000 [IU] via SUBCUTANEOUS
  Filled 2019-10-15 (×88): qty 1

## 2019-10-15 NOTE — Procedures (Signed)
Bronchoscopy Procedure Note Casey Richard [id number] Sep 03, 1955  Procedure: Bronchoscopy Indications: Procedural guidance for tracheostomy insertion.  Procedure Details Consent: Risks of procedure as well as the alternatives and risks of each were explained to the (patient/caregiver).  Consent for procedure obtained. Time Out: Verified patient identification, verified procedure, site/side was marked, verified correct patient position, special equipment/implants available, medications/allergies/relevent history reviewed, required imaging and test results available.  Performed  In preparation for procedure, patient was given 100% FiO2 and bronchoscope lubricated. Sedation: Benzodiazepines, Muscle relaxants and fentanyl and propofol  Airway entered and the following bronchi were examined: trachea throughout procedure.  Tracheostomy well seated.  Evaluation Hemodynamic Status: BP stable throughout; O2 sats: stable throughout Patient's Current Condition: stable Specimens:  None Complications: No apparent complications Patient did tolerate procedure well.   Einar Grad Casey Richard 10/15/2019

## 2019-10-15 NOTE — Progress Notes (Signed)
Neurosurgery Service Progress Note  Subjective: NAE ON  Objective: Vitals:   10/15/19 0800 10/15/19 0830 10/15/19 0900 10/15/19 0916  BP: 113/68 111/68 117/73 (!) 159/66  Pulse: 79 75 73   Resp: (!) '23 16 16   ' Temp: (!) 100.5 F (38.1 C)     TempSrc: Axillary     SpO2: 99% 99% 99%   Weight:      Height:       Temp (24hrs), Avg:100 F (37.8 C), Min:99 F (37.2 C), Max:100.8 F (38.2 C)  CBC Latest Ref Rng & Units 10/15/2019 10/14/2019 10/14/2019  WBC 4.0 - 10.5 K/uL 11.8(H) - -  Hemoglobin 13.0 - 17.0 g/dL 9.8(L) 10.5(L) 10.5(L)  Hematocrit 39.0 - 52.0 % 30.1(L) 31.0(L) 31.0(L)  Platelets 150 - 400 K/uL 164 - -   BMP Latest Ref Rng & Units 10/15/2019 10/14/2019 10/14/2019  Glucose 70 - 99 mg/dL 105(H) - -  BUN 8 - 23 mg/dL 34(H) - -  Creatinine 0.61 - 1.24 mg/dL 0.66 - -  Sodium 135 - 145 mmol/L 147(H) 147(H) 147(H)  Potassium 3.5 - 5.1 mmol/L 3.9 3.6 3.7  Chloride 98 - 111 mmol/L 120(H) - -  CO2 22 - 32 mmol/L 23 - -  Calcium 8.9 - 10.3 mg/dL 7.7(L) - -    Intake/Output Summary (Last 24 hours) at 10/15/2019 1019 Last data filed at 10/15/2019 0800 Gross per 24 hour  Intake 4597.78 ml  Output 2620 ml  Net 1977.78 ml    Current Facility-Administered Medications:  .  0.9 %  sodium chloride infusion, , Intravenous, PRN, Judith Part, MD, Stopped at 10/15/19 0615 .  acetaminophen (TYLENOL) 160 MG/5ML solution 650 mg, 650 mg, Per Tube, Q4H PRN, Ashok Pall, MD, 650 mg at 10/15/19 0916 .  [DISCONTINUED] acetaminophen (TYLENOL) tablet 650 mg, 650 mg, Oral, Q4H PRN **OR** acetaminophen (TYLENOL) suppository 650 mg, 650 mg, Rectal, Q4H PRN, Aarvi Stotts A, MD .  amLODipine (NORVASC) tablet 5 mg, 5 mg, Per Tube, Daily, Omar Person, NP, 5 mg at 10/15/19 0916 .  bethanechol (URECHOLINE) tablet 10 mg, 10 mg, Per Tube, TID, Kipp Brood, MD, 10 mg at 10/15/19 0916 .  chlorhexidine gluconate (MEDLINE KIT) (PERIDEX) 0.12 % solution 15 mL, 15 mL, Mouth Rinse, BID,  Agarwala, Ravi, MD, 15 mL at 10/15/19 0746 .  Chlorhexidine Gluconate Cloth 2 % PADS 6 each, 6 each, Topical, Daily, Judith Part, MD, 6 each at 10/13/19 1230 .  dexmedetomidine (PRECEDEX) 400 MCG/100ML (4 mcg/mL) infusion, 0.4-1.2 mcg/kg/hr, Intravenous, Titrated, Agarwala, Ravi, MD, Last Rate: 8.69 mL/hr at 10/15/19 0800, 0.5 mcg/kg/hr at 10/15/19 0800 .  docusate (COLACE) 50 MG/5ML liquid 100 mg, 100 mg, Per Tube, BID, Ashok Pall, MD, 100 mg at 10/14/19 2217 .  enalaprilat (VASOTEC) injection 1.25 mg, 1.25 mg, Intravenous, Q6H PRN, Vallarie Mare, MD, 1.25 mg at 10/12/19 0232 .  feeding supplement (OSMOLITE 1.2 CAL) liquid 1,000 mL, 1,000 mL, Per Tube, Continuous, Judith Part, MD, Last Rate: 70 mL/hr at 10/14/19 2000, Rate Verify at 10/14/19 2000 .  feeding supplement (PRO-STAT SUGAR FREE 64) liquid 30 mL, 30 mL, Per Tube, Daily, Bartholomew Ramesh A, MD, 30 mL at 10/15/19 0916 .  heparin injection 5,000 Units, 5,000 Units, Subcutaneous, Q8H, Agarwala, Ravi, MD .  HYDROcodone-acetaminophen (NORCO/VICODIN) 5-325 MG per tablet 1 tablet, 1 tablet, Per Tube, Q4H PRN, Ashok Pall, MD .  HYDROmorphone (DILAUDID) injection 0.5 mg, 0.5 mg, Intravenous, Q3H PRN, Judith Part, MD, 0.5 mg at 10/11/19 0336 .  hydroxypropyl methylcellulose / hypromellose (ISOPTO TEARS / GONIOVISC) 2.5 % ophthalmic solution 1 drop, 1 drop, Right Eye, Q2H, Judith Part, MD, 1 drop at 10/15/19 0924 .  labetalol (NORMODYNE) injection 10-40 mg, 10-40 mg, Intravenous, Q10 min PRN, Judith Part, MD, 20 mg at 10/14/19 0131 .  MEDLINE mouth rinse, 15 mL, Mouth Rinse, 10 times per day, Agarwala, Einar Grad, MD, 15 mL at 10/15/19 0924 .  nicardipine (CARDENE) 65m in 0.86% saline 2052mIV infusion (0.1 mg/ml), 3-15 mg/hr, Intravenous, Titrated, ThVallarie MareMD, Stopped at 10/15/19 0445 .  ondansetron (ZOFRAN) tablet 4 mg, 4 mg, Oral, Q4H PRN **OR** ondansetron (ZOFRAN) injection 4 mg, 4 mg,  Intravenous, Q4H PRN, Sena Clouatre A, MD .  piperacillin-tazobactam (ZOSYN) IVPB 3.375 g, 3.375 g, Intravenous, Q8H, Agarwala, Ravi, MD, Last Rate: 12.5 mL/hr at 10/15/19 0615, 3.375 g at 10/15/19 0615 .  polyethylene glycol (MIRALAX / GLYCOLAX) packet 17 g, 17 g, Per Tube, Daily PRN, CaAshok PallMD .  promethazine (PHENERGAN) tablet 12.5-25 mg, 12.5-25 mg, Oral, Q4H PRN, Anjelika Ausburn A, MD .  propofol (DIPRIVAN) 10 mg/mL bolus/IV push 67.9 mg, 1 mg/kg, Intravenous, Once, AgKipp BroodMD   Physical Exam: Intubated, FCx4 briskly without preference, incision c/d/i  Assessment & Plan: 89.o. 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 MRI with good debulking of tumor, pre-post op measurements show decrease in tumor volume from 19.7cc-->4.3cc. 4/22 progressive obtundation, CTH with post-op resection cavity hemorrhage not present on MRI, brainstem compression and developing obstructive hydrocephalus, taken back to OR for hematoma removal and hemostasis. Unfortunately, as expected, post-op cranial nerve dysfunction after hematoma evacuation despite normal intra-op stimulation. Post-op CTH with improved mass effect on brainstem, slowly improving ventricular size with some IVH, 4/26 CTH stable ventricular size, hemorrhage resolving, 4/26 intubated for aspiration / PNA, 4/27 trach  -trach today -eye drops to R eye, tape shut while asleep to protect cornea given facial weakness -PEG c/s -final path cystic schwannoma -SCDs/TEDs, [facility] - defer to CCM on timing  ThJudith Part04/27/21 10:19 AM

## 2019-10-15 NOTE — Progress Notes (Signed)
Nutrition Follow-up  DOCUMENTATION CODES:   Severe malnutrition in context of chronic illness  INTERVENTION:   Resume Osmolite 1.2 @ 70 ml/hr via Cortrak tube after trach placement  30 ml Prostat daily  Provides: 2116 kcal, 108 grams protein, and 1362 ml free water.    NUTRITION DIAGNOSIS:   Severe Malnutrition related to chronic illness as evidenced by severe muscle depletion, severe fat depletion, percent weight loss, energy intake < or equal to 75% for > or equal to 1 month.  Ongoing.  GOAL:   Patient will meet greater than or equal to 90% of their needs  Progressing.  MONITOR:   PO intake, Supplement acceptance, Weight trends, Labs  REASON FOR ASSESSMENT:   Consult Enteral/tube feeding initiation and management  ASSESSMENT:   Pt with a PMH significant for complete heart block s/p PPM who presented to the hospital for worsening dizziness, gait instability, and weight loss. MRI revealed 2 large brain masses.  4/19 s/p RIGHT CRANIOTOMY TUMOR EXCISION 4/22 s/p repeat crani for evacuation of hematoma after tumor excision  4/23 failed swallow, Cortrak placed; tip gastric  4/26 intubated  Pt discussed during ICU rounds and with RN.  Plan for trach today (TF on hold) and hopeful for transition to trach collar soon.   Patient is currently intubated on ventilator support MV: 9.4 L/min Temp (24hrs), Avg:99.7 F (37.6 C), Min:99 F (37.2 C), Max:100.8 F (38.2 C)  Labs reviewed Medications reviewed and include: colace   Diet Order:   Diet Order            Diet NPO time specified  Diet effective midnight              EDUCATION NEEDS:   Not appropriate for education at this time  Skin:  Skin Assessment: Skin Integrity Issues: Skin Integrity Issues:: Incisions Incisions: head  Last BM:  4/21  Height:   Ht Readings from Last 1 Encounters:  10/14/19 5\' 10"  (1.778 m)    Weight:   Wt Readings from Last 1 Encounters:  10/15/19 69.5 kg    BMI:   Body mass index is 21.98 kg/m.  Estimated Nutritional Needs:   Kcal:  2100  Protein:  100-115 grams  Fluid:  >2L/d  Chelle Cayton P., RD, LDN, CNSC See AMiON for contact information

## 2019-10-15 NOTE — Procedures (Signed)
Procedure: Percutaneous Tracheostomy CPT 31600 Performed by: Dr. Erskine Emery Bronchoscopy Assistant: Wallene Dales   Indications: Chronic respiratory failure and need for ongoing mechanical ventilation.  Consent: Given patient's intubation and sedation, the patient was unable to provide consent. Discussed the procedure with the patient's decision maker, including the indications, risks, benefits, and alternatives. All questions were answered. Verbal witnessed consent was obtained and placed in the chart.  Preprocedure: Universal protocol was followed for this procedure. Prior to the initiation of sedation or the procedure, a timeout/"Pause for the Cause" was performed. The patient's identity was verified by confirming the patient's wrist band for name, date of birth, and medical record number. Everyone in the room was in agreement with the patient identify, the procedure to be performed, consent was in place and matched the planned procedure, and the procedure site. The area was cleaned with a CHG scrub and draped with large sterile barrier. Hand hygiene was performed, and cap, mask, sterile gown, and sterile gloves were worn. The patient was covered by a large sterile drape. Sterile technique was maintained for the entire procedure.  Anesthesia: The patient was intubated and sedated prior to the procedure. Additional midazolam, etomidate, fentanyl and rocuronium were given for sedation and paralysis with close attention to vital signs throughout procedure. Please refer to the accompanying procedural sedation form for additional details. Once the patient was adequately sedated and with continuous BIS monitoring, vecuronium was administered for paralysis.  Procedure: The patient was placed in the supine position. The anterior neck was prepped and draped in usual sterile fashion. 1% lidocaine was administered approximately 2 fingerbreadths above the sternal notch for local anesthesia. A 1.5-cm vertical incision  was then performed 2 fingerbreadths above the sternal notch. Using a curved Kelly, blunt dissection was performed down to the level of the pretracheal fascia. At this point, the bronchoscope was introduced through the endotracheal tube and the trachea was properly visualized. The endotracheal tube was then gradually withdrawn within the trachea under direct bronchoscopic visualization. Proper midline position was confirmed by bouncing the needle from the tracheostomy tray over the trachea with bronchoscopic examination. The needle was advanced into the trachea and proper positioning was confirmed with direct visualization. The needle was then removed leaving a white outer cannula in position. The wire from the tracheostomy tray was then advanced through the white outer cannula. The cannula was then removed. The small, blue dilator was then advanced over the wire into the trachea. Once proper dilatation was achieved, the dilator was removed. The large, tapered dilator was then advanced over the wire into the trachea. The dilator was removed leaving the wire and white inner cannula in position. A number 6 percutaneous Shiley tracheostomy tube was then advanced over the wire and white inner cannula into the trachea. Proper positioning was confirmed with bronchoscopic visualization. The tracheostomy tube was then sutured in place with four nylon sutures. It was further secured with a tracheostomy tie.  Estimated blood loss: Less than 5 mL.  Complications: None immediate.  CXR ordered.  Erick Colace ACNP-BC [place] Pager # [phone] OR # [phone] if no answer

## 2019-10-15 NOTE — Procedures (Signed)
Bedside Tracheostomy Insertion Procedure Note   Patient Details:   Name: Casey Richard DOB: [birth year] MRN: [id number]  Procedure: Tracheostomy  Pre Procedure Assessment: ET Tube Size: 8.0 ET Tube secured at lip (cm): 24 Bite block in place: No Breath Sounds: Rhonch  Post Procedure Assessment: BP 122/80   Pulse 71   Temp 99 F (37.2 C)   Resp 16   Ht 5\' 10"  (1.778 m)   Wt 69.5 kg   SpO2 100%   BMI 21.98 kg/m  O2 sats: stable throughout Complications: No apparent complications Patient did tolerate procedure well Tracheostomy Brand:Shiley Tracheostomy Style:Cuffed Tracheostomy Size: 6.0 Tracheostomy Secured [id number], velcro Tracheostomy Placement Confirmation:Trach cuff visualized and in place and Chest X ray ordered for placement    Kathie Dike 10/15/2019, 1:52 PM

## 2019-10-15 NOTE — Progress Notes (Signed)
Attempted to see pt today.  Pt intubated and awaiting trach surgery this afternoon.  Will defer and reeval when schedule allows.  Jinger Neighbors, [place] [id number]

## 2019-10-15 NOTE — Consult Note (Signed)
[facility] Surgery Consult Note  KATHY MARDER [birth year]  [id number].    Requesting MD: Zada Finders Chief Complaint/Reason for Consult: PEG placement  HPI:  Patient is a 64 year old male who presented to the ED 09/27/19 with gait instability, memory loss and falls that have progressively worsened over the last year since wife's death. Work up revealed hypodense lesion with mass effect on pons and cerebellum, trial of non-operative treatment with steroids was initiated. 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 Patient with worsened lethargy 4/22 and follow up head CT showed post-operative hemorrhage with brainstem compression. Patient returned to the OR for evacuation of hematoma. Patient with worsening respiratory failure 4/24 and CCM consulted the following day. Patient undergoing tracheostomy today (4/27) with PCCM. PMH significant for heart block s/p pacemaker, NSVT, CKD. No past abdominal surgery. Trauma consulted for possible PEG placement.   ROS: Review of Systems  Unable to perform ROS: Intubated    Family History  Problem Relation Age of Onset  . Coronary artery disease Mother        AICD  . COPD Father   . Coronary artery disease Brother     Past Medical History:  Diagnosis Date  . Cardiac arrest ([place])   . CHB (complete heart block) ([place])   . 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 Dysrhythmia 1/13   bradycardia, VF, cardiac arrest/complete heart block with pacer inserted/LOV Dr Sallyanne Kuster 07/21/11 with interrogation and anesthesia guideline order on chart. Chest x ray 1/13 EPIC,, TEE and [facility]  1/13 EPIC  . Memory loss, short term    from fall with heart block 1/13  . Nonsustained ventricular tachycardia ([place]) 02/07/2014  . Presence of permanent cardiac pacemaker 07/15/2011   Medtronic Revo  . Seasonal allergies   . Shortness of breath     Past Surgical History:  Procedure  Laterality Date  . CARDIAC CATHETERIZATION  07/09/2011   Normal coronaries  . CRANIOTOMY Right 10/07/2019   Procedure: RIGHT CRANIOTOMY TUMOR EXCISION;  Surgeon: Judith Part, MD;  Location: [place];  Service: Neurosurgery;  Laterality: Right;  . CRANIOTOMY Right 10/10/2019   Procedure: CRANIOTOMY FOR EVACUATION OF HEMATOMA;  Surgeon: Judith Part, MD;  Location: [place];  Service: Neurosurgery;  Laterality: Right;  CRANIOTOMY TUMOR EXCISION  . CYSTOSCOPY WITH URETHRAL DILATATION  08/19/2011   Procedure: CYSTOSCOPY WITH URETHRAL DILATATION;  Surgeon: Dutch Gray, MD;  Location: [facility];  Service: Urology;  Laterality: N/A;  Balloon Dilation of Urethral Stricture, retro-urethrogram, Suprapubic change   . LEFT HEART CATHETERIZATION WITH CORONARY ANGIOGRAM N/A 07/09/2011   Procedure: LEFT HEART CATHETERIZATION WITH CORONARY ANGIOGRAM;  Surgeon: Jettie Booze, MD;  Location: [facility] CATH LAB;  Service: Cardiovascular;  Laterality: N/A;  . PACEMAKER INSERTION  07/15/11   Medtronic Revo  . PERMANENT PACEMAKER INSERTION Left 07/15/2011   Procedure: PERMANENT PACEMAKER INSERTION;  Surgeon: Sanda Klein, MD;  Location: [place] CATH LAB;  Service: Cardiovascular;  Laterality: Left;  . TEMPORARY PACEMAKER INSERTION Right 07/09/2011   Procedure: TEMPORARY PACEMAKER INSERTION;  Surgeon: Jettie Booze, MD;  Location: [facility] CATH LAB;  Service: Cardiovascular;  Laterality: Right;    Social History:  reports that he quit smoking about 30 years ago. His smoking use included cigarettes. He has never used smokeless tobacco. He reports that he does not drink alcohol or use drugs.  Allergies: No Known Allergies  Medications Prior  to Admission  Medication Sig Dispense Refill  . acetaminophen (TYLENOL) 500 MG tablet Take 1,000 mg by mouth every 6 (six) hours as needed. Pain     . Cholecalciferol (HM VITAMIN D3) 4000 units CAPS Take 1 capsule by mouth daily.    . cyanocobalamin 2000 MCG tablet Take 2,000 mcg by  mouth daily.    . Multiple Vitamin (MULITIVITAMIN WITH MINERALS) TABS Take 1 tablet by mouth daily.       Blood pressure 122/80, pulse 71, temperature 99 F (37.2 C), resp. rate 16, height 5\' 10"  (1.778 m), weight 69.5 kg, SpO2 100 %. Physical Exam:  General: sedated on the vent HEENT: head is normocephalic, atraumatic.  Sclera are noninjected.  PERRL.  Ears and nose without any masses or lesions.  Mouth is dry. Trach in place Heart: regular, rate, and rhythm.  Normal s1,s2. No obvious murmurs, gallops, or rubs noted.  Palpable radial and pedal pulses bilaterally Lungs: CTAB, no wheezes, rhonchi, or rales noted Abd: soft, NT, ND, +BS, no masses, hernias, or organomegaly, no prior scars noted MS: all 4 extremities are symmetrical with no cyanosis, clubbing, or edema. Calves soft and nontender bilaterally Skin: warm and dry with no masses, lesions, or rashes Neuro: sedated on precedex - unable to follow commands at this time Psych: unable to assess GU: foley in place  Results for orders placed or performed during the hospital encounter of 09/27/19 (from the past 48 hour(s))  Glucose, capillary     Status: Abnormal   Collection Time: 10/13/19  4:21 PM  Result Value Ref Range   Glucose-Capillary 162 (H) 70 - 99 mg/dL    Comment: Glucose reference range applies only to samples taken after fasting for at least 8 hours.  Magnesium     Status: None   Collection Time: 10/13/19  5:00 PM  Result Value Ref Range   Magnesium 2.0 1.7 - 2.4 mg/dL    Comment: Performed at [facility], [place] [address] [place], [place] [address]  Phosphorus     Status: None   Collection Time: 10/13/19  5:00 PM  Result Value Ref Range   Phosphorus 3.2 2.5 - 4.6 mg/dL    Comment: Performed at [facility] [address] [place], [place] [address]  Glucose, capillary     Status: Abnormal   Collection Time: 10/13/19  7:32 PM  Result Value Ref Range   Glucose-Capillary 106 (H) 70 - 99 mg/dL    Comment:  Glucose reference range applies only to samples taken after fasting for at least 8 hours.  Glucose, capillary     Status: Abnormal   Collection Time: 10/13/19 11:17 PM  Result Value Ref Range   Glucose-Capillary 130 (H) 70 - 99 mg/dL    Comment: Glucose reference range applies only to samples taken after fasting for at least 8 hours.  I-STAT 7, (LYTES, BLD GAS, ICA, H+H)     Status: Abnormal   Collection Time: 10/13/19 11:52 PM  Result Value Ref Range   pH, Arterial 7.483 (H) 7.350 - 7.450   pCO2 arterial 30.0 (L) 32.0 - 48.0 mmHg   pO2, Arterial 52 (L) 83.0 - 108.0 mmHg   Bicarbonate 22.5 20.0 - 28.0 mmol/L   TCO2 23 22 - 32 mmol/L   O2 Saturation 89.0 %   Acid-Base Excess 0.0 0.0 - 2.0 mmol/L   Sodium 145 135 - 145 mmol/L   Potassium 4.0 3.5 - 5.1 mmol/L   Calcium, Ion 1.19 1.15 -  1.40 mmol/L   HCT 32.0 (L) 39.0 - 52.0 %   Hemoglobin 10.9 (L) 13.0 - 17.0 g/dL   Patient temperature 98.7 F    Collection site Magazine features editor by RT    Sample type ARTERIAL   I-STAT 7, (LYTES, BLD GAS, ICA, H+H)     Status: Abnormal   Collection Time: 10/14/19  1:09 AM  Result Value Ref Range   pH, Arterial 7.493 (H) 7.350 - 7.450   pCO2 arterial 30.9 (L) 32.0 - 48.0 mmHg   pO2, Arterial 59 (L) 83.0 - 108.0 mmHg   Bicarbonate 23.5 20.0 - 28.0 mmol/L   TCO2 24 22 - 32 mmol/L   O2 Saturation 92.0 %   Acid-Base Excess 1.0 0.0 - 2.0 mmol/L   Sodium 145 135 - 145 mmol/L   Potassium 4.0 3.5 - 5.1 mmol/L   Calcium, Ion 1.19 1.15 - 1.40 mmol/L   HCT 32.0 (L) 39.0 - 52.0 %   Hemoglobin 10.9 (L) 13.0 - 17.0 g/dL   Patient temperature 100.1 F    Collection site Magazine features editor by Operator    Sample type ARTERIAL   Glucose, capillary     Status: Abnormal   Collection Time: 10/14/19  3:08 AM  Result Value Ref Range   Glucose-Capillary 123 (H) 70 - 99 mg/dL    Comment: Glucose reference range applies only to samples taken after fasting for at least 8 hours.  CBC     Status: Abnormal   Collection  Time: 10/14/19  4:31 AM  Result Value Ref Range   WBC 18.4 (H) 4.0 - 10.5 K/uL   RBC 3.49 (L) 4.22 - 5.81 MIL/uL   Hemoglobin 11.4 (L) 13.0 - 17.0 g/dL   HCT 34.5 (L) 39.0 - 52.0 %   MCV 98.9 80.0 - 100.0 fL   MCH 32.7 26.0 - 34.0 pg   MCHC 33.0 30.0 - 36.0 g/dL   RDW 13.9 11.5 - 15.5 %   Platelets 187 150 - 400 K/uL   nRBC 0.0 0.0 - 0.2 %    Comment: Performed at [facility], [place] [address] [place], [place] [id number]  Basic metabolic panel     Status: Abnormal   Collection Time: 10/14/19  4:31 AM  Result Value Ref Range   Sodium 145 135 - 145 mmol/L   Potassium 3.7 3.5 - 5.1 mmol/L   Chloride 114 (H) 98 - 111 mmol/L   CO2 22 22 - 32 mmol/L   Glucose, Bld 126 (H) 70 - 99 mg/dL    Comment: Glucose reference range applies only to samples taken after fasting for at least 8 hours.   BUN 37 (H) 8 - 23 mg/dL   Creatinine, Ser 0.79 0.61 - 1.24 mg/dL   Calcium 8.1 (L) 8.9 - 10.3 mg/dL   GFR calc non Af Amer >60 >60 mL/min   GFR calc Af Amer >60 >60 mL/min   Anion gap 9 5 - 15    Comment: Performed at [facility] [address] [place], [place] [address]  Glucose, capillary     Status: Abnormal   Collection Time: 10/14/19  6:26 AM  Result Value Ref Range   Glucose-Capillary 103 (H) 70 - 99 mg/dL    Comment: Glucose reference range applies only to samples taken after fasting for at least 8 hours.  I-STAT 7, (LYTES, BLD GAS, ICA, H+H)     Status: Abnormal   Collection Time: 10/14/19  6:26 AM  Result Value Ref Range   pH, Arterial 7.473 (H) 7.350 - 7.450   pCO2 arterial 32.5 32.0 - 48.0 mmHg   pO2, Arterial 80 (L) 83.0 - 108.0 mmHg   Bicarbonate 23.7 20.0 - 28.0 mmol/L   TCO2 25 22 - 32 mmol/L   O2 Saturation 96.0 %   Acid-Base Excess 1.0 0.0 - 2.0 mmol/L   Sodium 147 (H) 135 - 145 mmol/L   Potassium 3.7 3.5 - 5.1 mmol/L   Calcium, Ion 1.17 1.15 - 1.40 mmol/L   HCT 31.0 (L) 39.0 - 52.0 %   Hemoglobin 10.5 (L) 13.0 - 17.0 g/dL   Patient temperature 99.8 F     Sample type ARTERIAL   Glucose, capillary     Status: Abnormal   Collection Time: 10/14/19  7:32 AM  Result Value Ref Range   Glucose-Capillary 111 (H) 70 - 99 mg/dL    Comment: Glucose reference range applies only to samples taken after fasting for at least 8 hours.   Comment 1 Notify RN    Comment 2 Document in Chart   I-STAT 7, (LYTES, BLD GAS, ICA, H+H)     Status: Abnormal   Collection Time: 10/14/19  9:30 AM  Result Value Ref Range   pH, Arterial 7.424 7.350 - 7.450   pCO2 arterial 35.5 32.0 - 48.0 mmHg   pO2, Arterial 102 83.0 - 108.0 mmHg   Bicarbonate 23.0 20.0 - 28.0 mmol/L   TCO2 24 22 - 32 mmol/L   O2 Saturation 98.0 %   Acid-base deficit 1.0 0.0 - 2.0 mmol/L   Sodium 147 (H) 135 - 145 mmol/L   Potassium 3.6 3.5 - 5.1 mmol/L   Calcium, Ion 1.19 1.15 - 1.40 mmol/L   HCT 31.0 (L) 39.0 - 52.0 %   Hemoglobin 10.5 (L) 13.0 - 17.0 g/dL   Patient temperature 100.8 F    Collection site Magazine features editor by Operator    Sample type ARTERIAL   Glucose, capillary     Status: Abnormal   Collection Time: 10/14/19 11:05 AM  Result Value Ref Range   Glucose-Capillary 106 (H) 70 - 99 mg/dL    Comment: Glucose reference range applies only to samples taken after fasting for at least 8 hours.   Comment 1 Notify RN    Comment 2 Document in Chart   Glucose, capillary     Status: Abnormal   Collection Time: 10/14/19  3:25 PM  Result Value Ref Range   Glucose-Capillary 141 (H) 70 - 99 mg/dL    Comment: Glucose reference range applies only to samples taken after fasting for at least 8 hours.   Comment 1 Notify RN    Comment 2 Document in Chart   Glucose, capillary     Status: Abnormal   Collection Time: 10/14/19  7:17 PM  Result Value Ref Range   Glucose-Capillary 130 (H) 70 - 99 mg/dL    Comment: Glucose reference range applies only to samples taken after fasting for at least 8 hours.  Glucose, capillary     Status: Abnormal   Collection Time: 10/14/19 11:20 PM  Result Value Ref  Range   Glucose-Capillary 136 (H) 70 - 99 mg/dL    Comment: Glucose reference range applies only to samples taken after fasting for at least 8 hours.  Glucose, capillary     Status: Abnormal   Collection Time: 10/15/19  3:24 AM  Result Value Ref Range   Glucose-Capillary 109 (H) 70 -  99 mg/dL    Comment: Glucose reference range applies only to samples taken after fasting for at least 8 hours.  Basic metabolic panel     Status: Abnormal   Collection Time: 10/15/19  6:35 AM  Result Value Ref Range   Sodium 147 (H) 135 - 145 mmol/L   Potassium 3.9 3.5 - 5.1 mmol/L   Chloride 120 (H) 98 - 111 mmol/L   CO2 23 22 - 32 mmol/L   Glucose, Bld 105 (H) 70 - 99 mg/dL    Comment: Glucose reference range applies only to samples taken after fasting for at least 8 hours.   BUN 34 (H) 8 - 23 mg/dL   Creatinine, Ser 0.66 0.61 - 1.24 mg/dL   Calcium 7.7 (L) 8.9 - 10.3 mg/dL   GFR calc non Af Amer >60 >60 mL/min   GFR calc Af Amer >60 >60 mL/min   Anion gap 4 (L) 5 - 15    Comment: Performed at [facility] [address] [place], [place] [address]  CBC     Status: Abnormal   Collection Time: 10/15/19  6:35 AM  Result Value Ref Range   WBC 11.8 (H) 4.0 - 10.5 K/uL   RBC 2.96 (L) 4.22 - 5.81 MIL/uL   Hemoglobin 9.8 (L) 13.0 - 17.0 g/dL   HCT 30.1 (L) 39.0 - 52.0 %   MCV 101.7 (H) 80.0 - 100.0 fL   MCH 33.1 26.0 - 34.0 pg   MCHC 32.6 30.0 - 36.0 g/dL   RDW 14.0 11.5 - 15.5 %   Platelets 164 150 - 400 K/uL   nRBC 0.0 0.0 - 0.2 %    Comment: Performed at [facility], [place] [address], [place], [place] [address]  Magnesium     Status: None   Collection Time: 10/15/19  6:35 AM  Result Value Ref Range   Magnesium 2.0 1.7 - 2.4 mg/dL    Comment: Performed at [facility] [address] [place], [place] [address]  Phosphorus     Status: None   Collection Time: 10/15/19  6:35 AM  Result Value Ref Range   Phosphorus 3.0 2.5 - 4.6 mg/dL    Comment: Performed at [facility] [address], [place], [place] [address]  Glucose, capillary     Status: Abnormal   Collection Time: 10/15/19  7:50 AM  Result Value Ref Range   Glucose-Capillary 103 (H) 70 - 99 mg/dL    Comment: Glucose reference range applies only to samples taken after fasting for at least 8 hours.   Comment 1 Notify RN    Comment 2 Document in Chart   Glucose, capillary     Status: Abnormal   Collection Time: 10/15/19 11:23 AM  Result Value Ref Range   Glucose-Capillary 105 (H) 70 - 99 mg/dL    Comment: Glucose reference range applies only to samples taken after fasting for at least 8 hours.   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 EXAM: CT HEAD WITHOUT CONTRAST TECHNIQUE: Contiguous axial images were obtained from the base of the skull through the vertex without intravenous contrast. COMPARISON:  CT head 10/11/2019 FINDINGS: Brain: Right occipital craniotomy for tumor resection. Hemorrhage in the tumor bed right cerebellar pontine angle appears improved. Mild intraventricular hemorrhage in the occipital  horns bilaterally is unchanged. Mild ventricular enlargement appears unchanged. Small amount of subarachnoid hemorrhage in the right frontal parietal lobe is unchanged. Subarachnoid hemorrhage left parietal lobe has improved. No new hemorrhage. Vascular: Negative for hyperdense vessel Skull: Right occipital craniotomy Sinuses/Orbits: Right mastoid effusion has progressed since the prior CT. Mild mucosal edema paranasal sinuses without air-fluid level. Left mastoid sinus remains clear. Other: None IMPRESSION: 1. Interval improvement in hemorrhage in the tumor bed right cerebellar pontine angle . 2. Mild ventricular hemorrhage and mild hydrocephalus unchanged. Subarachnoid hemorrhage bilaterally appears unchanged on the right but improved on the left. No new hemorrhage.  Electronically Signed   By: Franchot Gallo M.D.   On: 10/14/2019 10:59   DG Chest Port 1 View  Result Date: 10/15/2019 CLINICAL DATA:  Ventilator dependent. EXAM: PORTABLE CHEST 1 VIEW COMPARISON:  10/14/2019 FINDINGS: Endotracheal tube is 3.7 cm above the carina and appropriately positioned. Feeding tube extends into the abdomen but the tip is beyond the image. Again noted is a right chest dual chamber cardiac pacemaker. Heart size is stable. Persistent densities in the mid and right lower chest have slightly decreased. Persistent densities at the left lung base are suggestive for atelectasis. Upper lungs remain clear without pneumothorax. IMPRESSION: 1. Aeration at the right lung base has slightly improved. Persistent opacities in the mid and lower right chest. Left basilar densities are most compatible with atelectasis. 2. Endotracheal tube is appropriately positioned. Electronically Signed   By: Markus Daft M.D.   On: 10/15/2019 08:37   DG CHEST PORT 1 VIEW  Result Date: 10/14/2019 CLINICAL DATA:  Respiratory failure EXAM: PORTABLE CHEST 1 VIEW COMPARISON:  10/14/2019 FINDINGS: Endotracheal tube is 5.5 cm above the carina. Feeding tube and right pacer unchanged. Bilateral lower lobe airspace opacities, right greater than left, similar to prior study. Possible layering right effusion. No pneumothorax. No acute bony abnormality. IMPRESSION: Continued lower lobe airspace opacities, right greater than left, unchanged. Possible layering right effusion. Endotracheal tube 5.5 cm above the carina. Electronically Signed   By: Rolm Baptise M.D.   On: 10/14/2019 11:59   DG Chest Port 1 View  Result Date: 10/14/2019 CLINICAL DATA:  Hypoxia. EXAM: PORTABLE CHEST 1 VIEW COMPARISON:  October 12, 2019 FINDINGS: There is stable nasogastric tube positioning. A dual lead AICD is in place. Mild, stable areas of bibasilar atelectasis and/or infiltrate are seen, right greater than left. No pneumothorax is identified. The  heart size and mediastinal contours are within normal limits. The visualized skeletal structures are unremarkable. IMPRESSION: No significant interval change when compared to the prior chest plain film dated October 12, 2019. Electronically Signed   By: Virgina Norfolk M.D.   On: 10/14/2019 01:06      Assessment/Plan CPA Mass s/p resection Post-op hemorrhage s/p evacuation VDRF secondary to aspiration PNA S/p tracheostomy 10/15/2019 Hx of heart block and NSVT - s/p pacemaker  - Above per CCM and primary team -   Dysphagia  Consult for PEG placement - Trauma consulted for consideration of PEG tube placement. I called and discussed the risks of procedure with the patient's brother, Contrell Evaristo, who agreed with proceeding. Will plan for bedside PEG 10/17/2019. Hold tube feedings at midnight the night before.  Wellington Hampshire, [facility] 10/15/2019, 2:43 PM Please see Amion for pager number during day hours 7:00am-4:30pm

## 2019-10-15 NOTE — Consult Note (Signed)
NAME:  Casey Richard, MRN:  [id number], DOB:  21-Oct-1955, LOS: 36 ADMISSION DATE:  09/27/2019, CONSULTATION DATE:  10/13/2019 REFERRING MD:  Dr. Zada Finders, CHIEF COMPLAINT:  Acute Hypoxic Respiratory Distress   History of present illness   64 year old male presents to ED on 4/9 with gait instability, memory loss, and falls. CT Head with hypodense lesion with mass effect on pons, right cerebellum. Treated with steroids. 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 On 4/22 patient with lethargy. Taken for CT head which revealed post-operative hemorrhage with brainstem compression. Taken to OR for evacuation. 4/24 patient with acute hypoxic respiratory distress. CXR with bibasilar opacities with right greater then left, concerning for aspiration. PCCM consulted.   Past Medical History   Past Medical History:  Diagnosis Date  . Cardiac arrest ([place])   . CHB (complete heart block) ([place])   . 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 Dysrhythmia 1/13   bradycardia, VF, cardiac arrest/complete heart block with pacer inserted/LOV Dr Sallyanne Kuster 07/21/11 with interrogation and anesthesia guideline order on chart. Chest x ray 1/13 EPIC,, TEE and [facility]  1/13 EPIC  . Memory loss, short term    from fall with heart block 1/13  . Nonsustained ventricular tachycardia ([place]) 02/07/2014  . Presence of permanent cardiac pacemaker 07/15/2011   [employer] Revo  . Seasonal allergies   . Shortness of breath      Significant Hospital Events   4/9 > Admit 4/19> Resection  4/22 > Evacuation of hemorrhage  4/25 > PCCM Consulted   Significant Diagnostic Tests:  CT Head 4/09 > 1. No acute intracranial or calvarial findings. 2. 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 This most likely represents an arachnoid cyst or epidermoid, and could be symptomatic based on its  mass effect. Cystic neoplasm less likely. Recommend further evaluation with MRI of the brain without and with contrast (if possible; the patient has a pacemaker) and non emergent neuro surgical consultation. 3. No evidence of acute cervical spine fracture, traumatic subluxation or static signs of instability. 4. Mildly progressive cervical spondylosis MR Brain 4/12 > 1. 4.7 x 3.4 x 4.1 cm multi-cystic right cerebellopontine angle mass with associated abnormal enhancement extending into the right internal auditory canal. 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 Significant associated mass effect upon the right cerebellopontine and cerebellomedullary angle with partial effacement of the fourth ventricle and mild adjacent parenchymal edema. No hydrocephalus. 2. A 17 x 14 mm hypoenhancing mass within the left aspect of the sella has increased in size as compared MRI 02/15/2012. 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 Correlate with relevant laboratory values. 3. Mild chronic small vessel ischemic, progressed as compared to 02/15/2012. 4. Mild ethmoid sinus mucosal thickening. Small bilateral maxillary sinus mucous retention cysts. CT Head 4/22 > Right occipital craniectomy and cranioplasty related 2 debulking surgery for cystic mass of the right CP angle region. Gross measurements of the lesion today are 3.2 x 5.3 x 4.5 cm. Internal hemorrhage within the residual cystic lesion. Mass-effect upon the fourth ventricle with subsequent enlargement of the lateral and third ventricles consistent with mild hydrocephalus. Small amount of blood layering dependently in the occipital horns. Small amount of subdural blood along the inferior tentorium as well as dissecting inferiorly to and through the  foramen magnum to the C1 level with mild mass effect upon the brainstem. CXR 4//24 > 1. Bibasilar opacities,  right greater than left may represent aspiration or pneumonia. There may be associated effusion on the right. Recommend clinical correlation and short-term follow-up imaging to ensure resolution. 2. No other acute abnormalities.  Micro Data:  Sputum 4/25 >> Blood 4/25 >>   Antimicrobials:  Zosyn 4/25 >>  Interim history/subjective:  Secretions have improved. Patient continues to follow commands in all four limbs.  Objective   Blood pressure 112/70, pulse 81, temperature 99 F (37.2 C), temperature source Axillary, resp. rate 16, height 5\' 10"  (1.778 m), weight 69.5 kg, SpO2 100 %.    Vent Mode: PRVC FiO2 (%):  [40 %-100 %] 40 % Set Rate:  [16 bmp] 16 bmp Vt Set:  [580 mL] 580 mL PEEP:  [5 cmH20] 5 cmH20 Plateau Pressure:  [13 cmH20-18 cmH20] 14 cmH20   Intake/Output Summary (Last 24 hours) at 10/15/2019 0755 Last data filed at 10/15/2019 0600 Gross per 24 hour  Intake 4430.37 ml  Output 3325 ml  Net 1105.37 ml   Filed Weights   10/12/19 0500 10/13/19 0500 10/15/19 0500  Weight: 67.9 kg 67.9 kg 69.5 kg    Examination: General: Adult male, calm, ventilated. HENT: ETT, SBFT with no skin breakdown. Lungs: chest clear bilaterally. Cardiovascular: Paced - HS normal Abdomen: Soft, non-tendrr, active bowel sounds  Extremities: no edema, superficial skin excoriation on left back of thigh. Neuro: sedated on precedex. Follows commands in all 4 limbs. Inattention. GU: intact, Foley catheter.   Assessment & Plan:   Critically ill due to Acute Hypoxic Respiratory Failure presumed secondary to aspiration PNA - failed HFNC due to inability to control secretions.  Location of tumor/bleed likely affecting pharyngeal function.  - Tracheostomy today - Transition to trach collar - Progressive ambulation.  CPA Mass s/p Resection, with hemorrhage s/p evacuation  -For repeat scan today. -Off Cardene - Start oral BP medications tomorrow.  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 Respiratory support goals: Full support. Pulmonary toilette. Tracheostomy today Blood pressure target: Cardene to keep SBP 120-160. Will start orals tomorrow. Remove arterial line follow tracheostomy DVT prophylaxis: Heparin tid to start today. Nutrition Status: Nutrition Problem: Severe Malnutrition Etiology: chronic illness Signs/Symptoms: severe muscle depletion, severe fat depletion, percent weight loss, energy intake < or equal to 75% for > or equal to 1 month Percent weight loss: 22 % Interventions: Ensure Enlive (each supplement provides 350kcal and 20 grams of protein), Magic cup, MVI, Liberalize Diet GI prophylaxis:  Famotidine Fluid status goals: negative fluid balance. Allow autoregulation. Urinary catheter: Assessment of intravascular volume and urinary retention. Will start Ureocholine Trial of voiding on 4/30 Central line: remove arterial line.  Glucose control: euglycemic on no therapy.  Mobility/therapy needs: PT/OT/SLP once trach in place Antibiotic de-escalation: Zosyn for 5 days for aspiration pneumonia. Home medication reconciliation: no relevant medications.  Daily labs: CBC, BNP Code Status: Full  Family Communication: Updated brother who has consented to tracheostomy tomorrow. Disposition: ICU    Labs   CBC: Recent Labs  Lab 10/09/19 0501 10/09/19 0501 10/10/19 [id number] 10/13/19 0900 10/13/19 [id number] 10/13/19 2352 10/14/19 0109 10/14/19 0431 10/14/19 [id number] 10/14/19 0930 10/15/19 [id number]  WBC 20.2*  --  19.7*  --  19.2*  --   --  18.4*  --   --  11.8*  HGB 12.9*   < > 13.7   < > 12.0*   < > 10.9* 11.4* 10.5* 10.5* 9.8*  HCT 39.2   < > 42.0   < > 36.2*   < > 32.0* 34.5* 31.0* 31.0*  30.1*  MCV 98.7  --  98.4  --  100.6*  --   --  98.9  --   --  101.7*  PLT 154  --  176  --  161  --   --  187  --   --  164   < > = values in this interval not displayed.    Basic Metabolic Panel: Recent Labs  Lab 10/10/19 0436 10/10/19 0904 10/12/19 0423 10/12/19 1634 10/13/19 0531 10/13/19 0900 10/13/19 0953 10/13/19 1700 10/13/19 2352 10/14/19 0109 10/14/19 0431 10/14/19 0626 10/14/19 0930 10/15/19 0635  NA  --  136  --   --   --    < > 144  --    < > 145 145 147* 147* 147*  K  --  4.2  --   --   --    < > 4.1  --    < > 4.0 3.7 3.7 3.6 3.9  CL  --  97*  --   --   --   --  112*  --   --   --  114*  --   --  120*  CO2  --  32  --   --   --   --  23  --   --   --  22  --   --  23  GLUCOSE  --  125*  --   --   --   --  119*  --   --   --  126*  --   --  105*  BUN  --  32*  --   --   --   --  35*  --   --   --  37*  --   --  34*  CREATININE  --  0.70  --   --   --   --  0.56*  --   --   --  0.79  --   --  0.66  CALCIUM  --  8.6*  --   --   --   --  8.3*  --   --   --  8.1*  --   --  7.7*  MG   < >  --  2.1 2.2 2.1  --   --  2.0  --   --   --   --   --  2.0  PHOS  --   --  2.2* 2.0* 1.8*  --   --  3.2  --   --   --   --   --  3.0   < > = values in this interval not displayed.   GFR: Estimated Creatinine Clearance: 90.5 mL/min (by C-G formula based on SCr of 0.66 mg/dL). Recent Labs  Lab 10/10/19 0436 10/13/19 0953 10/14/19 0431 10/15/19 0635  PROCALCITON  --  0.30  --   --   WBC 19.7* 19.2* 18.4* 11.8*  LATICACIDVEN  --  0.6  --   --     Liver Function Tests: Recent Labs  Lab 10/10/19 [id number] 10/13/19  0953  AST 23 25  ALT 99* 48*  ALKPHOS 52 63  BILITOT 1.0 1.2  PROT 6.3* 5.4*  ALBUMIN 3.4* 2.5*   No results for input(s): LIPASE, AMYLASE in the last 168 hours. Recent Labs  Lab 10/10/19 0904  AMMONIA 24    ABG    Component Value Date/Time   PHART 7.424 10/14/2019 0930   PCO2ART 35.5 10/14/2019 0930   PO2ART 102 10/14/2019 0930   HCO3 23.0 10/14/2019  0930   TCO2 24 10/14/2019 0930   ACIDBASEDEF 1.0 10/14/2019 0930   O2SAT 98.0 10/14/2019 0930     Coagulation Profile: No results for input(s): INR, PROTIME in the last 168 hours.  Cardiac Enzymes: No results for input(s): CKTOTAL, CKMB, CKMBINDEX, TROPONINI in the last 168 hours.  HbA1C: No results found for: HGBA1C  CBG: Recent Labs  Lab 10/14/19 1525 10/14/19 1917 10/14/19 2320 10/15/19 0324 10/15/19 0750  GLUCAP 141* 130* 136* 109* 103*   CRITICAL CARE Performed by: Kipp Brood   Total critical care time: 40 minutes  Critical care time was exclusive of separately billable procedures and treating other patients.  Critical care was necessary to treat or prevent imminent or life-threatening deterioration.  Critical care was time spent personally by me on the following activities: development of treatment plan with patient and/or surrogate as well as nursing, discussions with consultants, evaluation of patient's response to treatment, examination of patient, obtaining history from patient or surrogate, ordering and performing treatments and interventions, ordering and review of laboratory studies, ordering and review of radiographic studies, pulse oximetry, re-evaluation of patient's condition and participation in multidisciplinary rounds.  Kipp Brood, MD [facility] ICU Physician [place]  Pager: [phone] Mobile: [phone] After hours: [phone].

## 2019-10-15 NOTE — Progress Notes (Signed)
PT Cancellation Note  Patient Details Name: Casey Richard MRN: [id number] DOB: 1956-05-03   Cancelled Treatment:    Reason Eval/Treat Not Completed: Medical issues which prohibited therapy. Pt having a tracheostomy done. Acute PT to return as able to progress mobility.  Kittie Plater, PT, DPT Acute Rehabilitation Services Pager #: [phone] Office #: [phone]    Berline Lopes 10/15/2019, 10:01 AM

## 2019-10-15 NOTE — Progress Notes (Signed)
SLP Cancellation Note  Patient Details Name: Casey Richard MRN: [id number] DOB: 06/05/1956   Cancelled treatment:       Reason Eval/Treat Not Completed: Medical issues which prohibited therapy. Pt intubated on previous date. Per MD note, considering trach. Will f/u as able.    Osie Bond., M.A. [place] Acute Rehabilitation Services Pager [phone] Office [phone]  10/15/2019, 7:49 AM

## 2019-10-16 ENCOUNTER — Inpatient Hospital Stay: Payer: Medicare Other | Attending: Internal Medicine

## 2019-10-16 DIAGNOSIS — G9389 Other specified disorders of brain: Secondary | ICD-10-CM | POA: Diagnosis not present

## 2019-10-16 DIAGNOSIS — Z9911 Dependence on respirator [ventilator] status: Secondary | ICD-10-CM | POA: Diagnosis not present

## 2019-10-16 LAB — GLUCOSE, CAPILLARY
Glucose-Capillary: 131 mg/dL — ABNORMAL HIGH (ref 70–99)
Glucose-Capillary: 134 mg/dL — ABNORMAL HIGH (ref 70–99)
Glucose-Capillary: 110 mg/dL — ABNORMAL HIGH (ref 70–99)
Glucose-Capillary: 119 mg/dL — ABNORMAL HIGH (ref 70–99)
Glucose-Capillary: 140 mg/dL — ABNORMAL HIGH (ref 70–99)
Glucose-Capillary: 130 mg/dL — ABNORMAL HIGH (ref 70–99)

## 2019-10-16 LAB — CBC
HCT: 31.6 % — ABNORMAL LOW (ref 39.0–52.0)
Hemoglobin: 10.3 g/dL — ABNORMAL LOW (ref 13.0–17.0)
MCH: 33 pg (ref 26.0–34.0)
MCHC: 32.6 g/dL (ref 30.0–36.0)
MCV: 101.3 fL — ABNORMAL HIGH (ref 80.0–100.0)
Platelets: 164 10*3/uL (ref 150–400)
RBC: 3.12 MIL/uL — ABNORMAL LOW (ref 4.22–5.81)
RDW: 13.7 % (ref 11.5–15.5)
WBC: 10.2 10*3/uL (ref 4.0–10.5)
nRBC: 0 % (ref 0.0–0.2)

## 2019-10-16 LAB — BASIC METABOLIC PANEL
Anion gap: 12 (ref 5–15)
BUN: 32 mg/dL — ABNORMAL HIGH (ref 8–23)
CO2: 19 mmol/L — ABNORMAL LOW (ref 22–32)
Calcium: 7.8 mg/dL — ABNORMAL LOW (ref 8.9–10.3)
Chloride: 116 mmol/L — ABNORMAL HIGH (ref 98–111)
Creatinine, Ser: 0.68 mg/dL (ref 0.61–1.24)
GFR calc Af Amer: >60 mL/min (ref >60)
GFR calc non Af Amer: >60 mL/min (ref >60)
Glucose, Bld: 138 mg/dL — ABNORMAL HIGH (ref 70–99)
Potassium: 3.9 mmol/L (ref 3.5–5.1)
Sodium: 147 mmol/L — ABNORMAL HIGH (ref 135–145)

## 2019-10-16 LAB — MAGNESIUM: Magnesium: 2 mg/dL (ref 1.7–2.4)

## 2019-10-16 LAB — PHOSPHORUS: Phosphorus: 2.9 mg/dL (ref 2.5–4.6)

## 2019-10-16 MED ORDER — POLYETHYLENE GLYCOL 3350 17 G PO PACK
17.0000 g | PACK | Freq: Two times a day (BID) | ORAL | Status: DC
  Administered 2019-10-16: 17 g
  Filled 2019-10-16: qty 1

## 2019-10-16 NOTE — Progress Notes (Signed)
SLP Cancellation Note  Patient Details Name: TEMPLE SCHANKE MRN: [id number] DOB: 1955-07-31   Cancelled treatment:       Reason Eval/Treat Not Completed: Medical issues which prohibited therapy. Patient with new tracheostomy. Orders for SLP eval and treat for PMSV and swallowing received. Will follow pt closely for readiness for SLP interventions as appropriate.    Herbie Baltimore, MA CCC-SLP  Acute Rehabilitation Services Pager [phone] Office [phone]  Lynann Beaver 10/16/2019, 8:20 AM

## 2019-10-16 NOTE — Progress Notes (Addendum)
Occupational Therapy Re-Evaluation Patient Details Name: Casey Richard MRN: [id number] DOB: [birth year] Today's Date: 10/16/2019    History of present illness 64 y/o male admitted secondary to dizziness, weight loss, and gait instability. 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  MRI confirmed 2 large brain masses. S/p right crani for tumor excision on 10/07/19. PMH includes CKD and complete heart block s/p pacemaker. Neuro status changes s/p evacuation of post operative hematoma on 10/10/19. Patient then with acute hypoxic respiratory distress. CXR with bibasilar opacities with right greater then left, concerning for aspiration, re-intubated 4/26, s/p tracheostomy 4/27.    OT comments  Pt seen for re-evaluation now s/p re-intubation and tracheostomy. Pt lethargic during session and tending to maintain eyes closed; he will follow simple commands but often requires multimodal cues and repetition to do so. Pt tolerating sitting EOB approx 10-15 min during session, requiring two person assist for completion of mobility tasks and max hand over hand assist for simple grooming ADL. BP start of session 158/92, 168/93 end of session with bed egressed to chair position. Pt with notable weakness in bil UEs and edema noted RUE, elevated end of session and for comfort. Continue to recommend CIR level therapies at time of discharge at this time. Will follow while acutely admitted.   Follow Up Recommendations  CIR    Equipment Recommendations  Other (comment)(TBD)          Precautions / Restrictions Precautions Precautions: Fall Precaution Comments: leans to the right  Restrictions Weight Bearing Restrictions: No       Mobility Bed Mobility Overal bed mobility: Needs Assistance Bed Mobility: Supine to Sit;Sit to Supine     Supine to sit: Max assist;+2 for physical assistance Sit to supine: Max assist;Total assist;+2 for  physical assistance   General bed mobility comments: pt able to initiate moving LEs towards EOB, requires increased assist for LLE management vs RLE, tactile/verbal cues to reach with LUE and assist for trunk elevation   Transfers                 General transfer comment: deferred, egressed bed to chair position end of session    Balance Overall balance assessment: Needs assistance Sitting-balance support: Feet supported;Bilateral upper extremity supported Sitting balance-Leahy Scale: Poor Sitting balance - Comments: pt requiring external assist due to R lateral lean, increases with fatigue, pt nodding head "no" when asked if he felt like he was leaning. pt is able to activate trunk to bring himself forwards  Postural control: Right lateral lean                                 ADL either performed or assessed with clinical judgement   ADL Overall ADL's : Needs assistance/impaired     Grooming: Wash/dry face;Maximal assistance;Sitting Grooming Details (indicate cue type and reason): max hand over hand assist as well as distal support at R elbow to perform task, requires assist for thoroughness                                General ADL Comments: pt requiring totalA for remaining ADL at this time     Vision   Additional Comments: pt tending to maintain eyes closed today, ?R eye taped; endorses some dizziness, will benefit from additional visual assessment  Perception     Praxis      Cognition Arousal/Alertness: Awake/alert Behavior During Therapy: WFL for tasks assessed/performed Overall Cognitive Status: Difficult to assess Area of Impairment: Attention;Memory;Following commands;Safety/judgement;Awareness;Problem solving                   Current Attention Level: Focused Memory: Decreased short-term memory Following Commands: Follows one step commands inconsistently;Follows one step commands with increased time Safety/Judgement:  Decreased awareness of safety;Decreased awareness of deficits Awareness: Intellectual Problem Solving: Difficulty sequencing;Requires verbal cues;Requires tactile cues;Decreased initiation;Slow processing General Comments: pt tending to maintain eyes closed this session, follows basic commands but often requiring repetition and multimodal cues to do so, nodding head yes/no appropriately        Exercises Exercises: Other exercises;Low Level/ICU Low Level/ICU Exercises Quad Sets: Both;Seated Other Exercises Other Exercises: AAROM trunk/cervical rotation to L and R Other Exercises: passive scapular retraction/stretch   Shoulder Instructions       General Comments Pt on trach/vent with PEEP 5, FiO2 40%, RR slightly increased when fatigued (up to 30s for short periods), BP 158/92 start of session, 153/97 seated EOB, 168/93 end of session when bed in chair position    Pertinent Vitals/ Pain       Pain Assessment: Faces Faces Pain Scale: Hurts even more Pain Location: head, bil arms (pt shaking head yes when in pain and asking where) Pain Descriptors / Indicators: Grimacing Pain Intervention(s): Limited activity within patient's tolerance;Monitored during session;Repositioned  Home Living                                          Prior Functioning/Environment              Frequency  Min 3X/week        Progress Toward Goals  OT Goals(current goals can now be found in the care plan section)  Progress towards OT goals: OT to reassess next treatment  Acute Rehab OT Goals Patient Stated Goal: to go home Time For Goal Achievement: 10/30/19 Potential to Achieve Goals: Good  Plan Discharge plan remains appropriate    Co-evaluation    PT/OT/SLP Co-Evaluation/Treatment: Yes Reason for Co-Treatment: Complexity of the patient's impairments (multi-system involvement);Necessary to address cognition/behavior during functional activity;To address functional/ADL  transfers   OT goals addressed during session: ADL's and self-care;Strengthening/ROM      AM-PAC OT "6 Clicks" Daily Activity     Outcome Measure   Help from another person eating meals?: Total Help from another person taking care of personal grooming?: A Lot Help from another person toileting, which includes using toliet, bedpan, or urinal?: Total Help from another person bathing (including washing, rinsing, drying)?: Total Help from another person to put on and taking off regular upper body clothing?: Total Help from another person to put on and taking off regular lower body clothing?: Total 6 Click Score: 7    End of Session Equipment Utilized During Treatment: Oxygen  OT Visit Diagnosis: Unsteadiness on feet (R26.81);Repeated falls (R29.6);Muscle weakness (generalized) (M62.81)   Activity Tolerance Patient tolerated treatment well;Patient limited by fatigue   Patient Left in bed;with call bell/phone within reach;with bed alarm set;with restraints reapplied   Nurse Communication Mobility status        Time: [id number] OT Time Calculation (min): 38 min  Charges: OT General Charges $OT Visit: 1 Visit OT Evaluation $OT Re-eval: 1 Re-eval  Lou Cal, OT Acute  Rehabilitation Services Pager [phone] Office [phone]    Casey Richard 10/16/2019, 4:36 PM

## 2019-10-16 NOTE — Progress Notes (Signed)
Pharmacy Antibiotic Note  Casey Richard is a 64 y.o. male admitted on 09/27/2019 with CPA mass s/p resection.  Pharmacy has been consulted for Zosyn dosing for aspiration pneumonia. Patient has been receiving Zosyn 3.375g IV q8h (4 hour infusion), and today is day 4 of 5 planned days of therapy. Tracheal aspirate grew normal flora. WBC 10.2. Tmax 100.5. Chest x-ray yesterday with multilobar bilateral PNA with small pleural effusion   Plan: Continue zosyn 3.375 g IV q8h to be infused over 4 hours Monitor clinical progression    Height: 5\' 10"  (177.8 cm) Weight: 69.5 kg (153 lb 3.5 oz) IBW/kg (Calculated) : 73  Temp (24hrs), Avg:98.7 F (37.1 C), Min:97.5 F (36.4 C), Max:100.5 F (38.1 C)  Recent Labs  Lab 10/10/19 0436 10/10/19 0904 10/13/19 0953 10/14/19 0431 10/15/19 0635 10/16/19 0421  WBC 19.7*  --  19.2* 18.4* 11.8* 10.2  CREATININE  --  0.70 0.56* 0.79 0.66  --   LATICACIDVEN  --   --  0.6  --   --   --     Estimated Creatinine Clearance: 90.5 mL/min (by C-G formula based on SCr of 0.66 mg/dL).    No Known Allergies  Antimicrobials this admission: Zosyn 4/25>> Vancomycin 4/25 >> 4/26  Dose adjustments this admission: N/A   Microbiology results: 4/18 MRSA PCR neg 4/25 TA: normal flora  4/25 BCx: ngtd  Thank you for involving pharmacy in this patient's care.  Cristela Felt, PharmD PGY1 Pharmacy Resident [employer]: [phone]  10/16/2019 6:02 AM

## 2019-10-16 NOTE — Progress Notes (Signed)
Neurosurgery Service Progress Note  Subjective: NAE ON, trach'd at bedside yesterday without issue, FCx4 reliably overnight for nursing  Objective: Vitals:   10/16/19 0309 10/16/19 0400 10/16/19 0500 10/16/19 0600  BP:  137/81 133/81 136/82  Pulse:  77 77 75  Resp:  _0 Temp:  98.4 F (36.9 C)    TempSrc:  Axillary    SpO2: 100% 100% 100% 100%  Weight:      Height:       Temp (24hrs), Avg:98.7 F (37.1 C), Min:97.5 F (36.4 C), Max:100.5 F (38.1 C)  CBC Latest Ref Rng & Units 10/16/2019 10/15/2019 10/14/2019  WBC 4.0 - 10.5 K/uL 10.2 11.8(H) -  Hemoglobin 13.0 - 17.0 g/dL 10.3(L) 9.8(L) 10.5(L)  Hematocrit 39.0 - 52.0 % 31.6(L) 30.1(L) 31.0(L)  Platelets 150 - 400 K/uL 164 164 -   BMP Latest Ref Rng & Units 10/16/2019 10/15/2019 10/14/2019  Glucose 70 - 99 mg/dL 138(H) 105(H) -  BUN 8 - 23 mg/dL 32(H) 34(H) -  Creatinine 0.61 - 1.24 mg/dL 0.68 0.66 -  Sodium 135 - 145 mmol/L 147(H) 147(H) 147(H)  Potassium 3.5 - 5.1 mmol/L 3.9 3.9 3.6  Chloride 98 - 111 mmol/L 116(H) 120(H) -  CO2 22 - 32 mmol/L 19(L) 23 -  Calcium 8.9 - 10.3 mg/dL 7.8(L) 7.7(L) -    Intake/Output Summary (Last 24 hours) at 10/16/2019 0741 Last data filed at 10/16/2019 0600 Gross per 24 hour  Intake 1792.43 ml  Output 1720 ml  Net 72.43 ml    Current Facility-Administered Medications:    0.9 %  sodium chloride infusion, , Intravenous, PRN, Judith Part, MD, Stopped at 10/16/19 0328   acetaminophen (TYLENOL) 160 MG/5ML solution 650 mg, 650 mg, Per Tube, Q4H PRN, Ashok Pall, MD, 650 mg at 10/15/19 1117   [DISCONTINUED] acetaminophen (TYLENOL) tablet 650 mg, 650 mg, Oral, Q4H PRN **OR** acetaminophen (TYLENOL) suppository 650 mg, 650 mg, Rectal, Q4H PRN, Judith Part, MD   amLODipine (NORVASC) tablet 5 mg, 5 mg, Per Tube, Daily, Eubanks, Katalina M, NP, 5 mg at 10/15/19 0916   bethanechol (URECHOLINE) tablet 10 mg, 10 mg, Per Tube, TID, Agarwala, Einar Grad, MD, 10 mg at 10/15/19 2100  chlorhexidine gluconate (MEDLINE KIT) (PERIDEX) 0.12 % solution 15 mL, 15 mL, Mouth Rinse, BID, Agarwala, Ravi, MD, 15 mL at 10/15/19 2010   Chlorhexidine Gluconate Cloth 2 % PADS 6 each, 6 each, Topical, Daily, Lory Nowaczyk, Joyice Faster, MD, 6 each at 10/15/19 1538   dexmedetomidine (PRECEDEX) 400 MCG/100ML (4 mcg/mL) infusion, 0.4-1.2 mcg/kg/hr, Intravenous, Titrated, Agarwala, Ravi, MD, Last Rate: 6.95 mL/hr at 10/16/19 0400, 0.4 mcg/kg/hr at 10/16/19 0400   docusate (COLACE) 50 MG/5ML liquid 100 mg, 100 mg, Per Tube, BID, Ashok Pall, MD, 100 mg at 10/15/19 2100   enalaprilat (VASOTEC) injection 1.25 mg, 1.25 mg, Intravenous, Q6H PRN, Vallarie Mare, MD, 1.25 mg at 10/12/19 0232   feeding supplement (OSMOLITE 1.2 CAL) liquid 1,000 mL, 1,000 mL, Per Tube, Continuous, Kathaleya Mcduffee, Joyice Faster, MD, Last Rate: 70 mL/hr at 10/16/19 0600, Rate Verify at 10/16/19 0600   feeding supplement (PRO-STAT SUGAR FREE 64) liquid 30 mL, 30 mL, Per Tube, Daily, Journii Nierman A, MD, 30 mL at 10/15/19 0916   heparin injection 5,000 Units, 5,000 Units, Subcutaneous, Q8H, Agarwala, Ravi, MD, 5,000 Units at 10/16/19 0550   HYDROcodone-acetaminophen (NORCO/VICODIN) 5-325 MG per tablet 1 tablet, 1 tablet, Per Tube, Q4H PRN, Ashok Pall, MD   HYDROmorphone (DILAUDID) injection 0.5 mg, 0.5 mg, Intravenous,  Q3H PRN, Judith Part, MD, 0.5 mg at 10/11/19 3428   hydroxypropyl methylcellulose / hypromellose (ISOPTO TEARS / GONIOVISC) 2.5 % ophthalmic solution 1 drop, 1 drop, Right Eye, Q2H, Caelen Higinbotham, Joyice Faster, MD, 1 drop at 10/16/19 0551   labetalol (NORMODYNE) injection 10-40 mg, 10-40 mg, Intravenous, Q10 min PRN, Judith Part, MD, 20 mg at 10/14/19 0131   MEDLINE mouth rinse, 15 mL, Mouth Rinse, 10 times per day, Kipp Brood, MD, 15 mL at 10/16/19 0551   ondansetron (ZOFRAN) tablet 4 mg, 4 mg, Oral, Q4H PRN **OR** ondansetron (ZOFRAN) injection 4 mg, 4 mg, Intravenous, Q4H PRN, Sincere Berlanga, Joyice Faster, MD    piperacillin-tazobactam (ZOSYN) IVPB 3.375 g, 3.375 g, Intravenous, Q8H, Agarwala, Ravi, MD, Last Rate: 12.5 mL/hr at 10/16/19 0328, 3.375 g at 10/16/19 0328   polyethylene glycol (MIRALAX / GLYCOLAX) packet 17 g, 17 g, Per Tube, Daily PRN, Ashok Pall, MD   promethazine (PHENERGAN) tablet 12.5-25 mg, 12.5-25 mg, Oral, Q4H PRN, Judith Part, MD   Physical Exam: Intubated, FCx4 briskly without preference, incision c/d/i  Assessment & Plan: 64 y.o. 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 MRI with good debulking of tumor, pre-post op measurements show decrease in tumor volume from 19.7cc-->4.3cc. 4/22 progressive obtundation, CTH with post-op resection cavity hemorrhage not present on MRI, brainstem compression and developing obstructive hydrocephalus, taken back to OR for hematoma removal and hemostasis. Unfortunately, as expected, post-op cranial nerve dysfunction after hematoma evacuation despite normal intra-op stimulation. Post-op CTH with improved mass effect on brainstem, slowly improving ventricular size with some IVH, 4/26 CTH stable ventricular size, hemorrhage resolving, 4/26 intubated for aspiration / PNA, 4/27 trach  -PEG tomorrow, hold TF at midnight -CCM weaning vent to TC, on zosyn long-infusion for aspiration PNA -eye drops to R eye, tape shut while asleep to protect cornea given facial weakness -final path cystic schwannoma -SCDs/TEDs, Jackey Loge  10/16/19 7:41 AM

## 2019-10-16 NOTE — Progress Notes (Signed)
Physical Therapy RE-Evaluation Patient Details Name: Casey Richard MRN: [id number] DOB: Nov 01, 1955 Today's Date: 10/16/2019    History of Present Illness 64 y/o male admitted secondary to dizziness, weight loss, and gait instability. 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  MRI confirmed 2 large brain masses. S/p right crani for tumor excision on 10/07/19. PMH includes CKD and complete heart block s/p pacemaker. Neuro status changes s/p evacuation of post operative hematoma on 10/10/19. Patient then with acute hypoxic respiratory distress. CXR with bibasilar opacities with right greater then left, concerning for aspiration, re-intubated 4/26, s/p tracheostomy 4/27 (remains ventilated).    PT Comments    Pt now trached and vented since last session.  Co-session with OT revealing progressive weakness L leg freater than R leg and right arm greater than left arm.  Pt continues to have a lateral trunk lean and R eye lid closure issues.  He was agreeable to participate with [place] EOB.  Vent setting are below and his VSS throughout with increased RR and slowly increasing BPs (see below).  PT will continue to follow acutely for safe mobility progression.  He remains appropriate for CIR level therapies and goals were updated.    Follow Up Recommendations  CIR     Equipment Recommendations  Rolling walker with 5" wheels;3in1 (PT);Hospital bed;Wheelchair (measurements PT);Wheelchair cushion (measurements PT)    Recommendations for Other Services   NA     Precautions / Restrictions Precautions Precautions: Fall Precaution Comments: leans to the right  Restrictions Weight Bearing Restrictions: No    Mobility  Bed Mobility Overal bed mobility: Needs Assistance Bed Mobility: Supine to Sit;Sit to Supine     Supine to sit: Max assist;+2 for physical assistance Sit to supine: Max assist;Total assist;+2 for physical assistance    General bed mobility comments: pt able to initiate moving LEs towards EOB, requires increased assist for LLE management vs RLE, tactile/verbal cues to reach with LUE and assist for trunk elevation Nearly total assist to return to supine after fatigue, support given at trunk and legs.   Transfers                 General transfer comment: deferred, egressed bed to chair position end of session      Balance Overall balance assessment: Needs assistance Sitting-balance support: Feet supported;Bilateral upper extremity supported Sitting balance-Leahy Scale: Poor Sitting balance - Comments: pt requiring external assist due to R lateral lean, increases with fatigue, pt nodding head "no" when asked if he felt like he was leaning. pt is able to activate trunk to bring himself forwards  Postural control: Right lateral lean                                  Cognition Arousal/Alertness: Lethargic(closing eye (L) throughout) Behavior During Therapy: Flat affect Overall Cognitive Status: Difficult to assess Area of Impairment: Attention;Memory;Following commands;Safety/judgement;Awareness;Problem solving                   Current Attention Level: Focused Memory: Decreased short-term memory Following Commands: Follows one step commands inconsistently;Follows one step commands with increased time Safety/Judgement: Decreased awareness of safety;Decreased awareness of deficits Awareness: Intellectual Problem Solving: Difficulty sequencing;Requires verbal cues;Requires tactile cues;Decreased initiation;Slow processing General Comments: pt tending to maintain eyes closed this session, follows basic commands but often requiring repetition and multimodal cues to do so, nodding  head yes/no appropriately      Exercises Low Level/ICU Exercises Quad Sets: Both;Seated(LAQs AAROM, difficulty preforming and cues for L eye open) Other Exercises Other Exercises: AAROM trunk/cervical  rotation to L and R Other Exercises: passive scapular retraction/stretch    General Comments General comments (skin integrity, edema, etc.): Pt on trach/vent with PEEP 5, FiO2 40%, RR slightly increased when fatigued (up to 30s for short periods), BP 158/92 start of session, 153/97 seated EOB, 168/93 end of session when bed in chair position      Pertinent Vitals/Pain Pain Assessment: Faces Faces Pain Scale: Hurts even more Pain Location: head, bil arms (pt shaking head yes when in pain and asking where) Pain Descriptors / Indicators: Grimacing Pain Intervention(s): Limited activity within patient's tolerance;Monitored during session;Repositioned           PT Goals (current goals can now be found in the care plan section) Acute Rehab PT Goals Patient Stated Goal: unable to state due to vent agreeable via head nods to get up EOB.  Progress towards PT goals: Not progressing toward goals - comment(now trached and vented)    Frequency    Min 3X/week      PT Plan Current plan remains appropriate    Co-evaluation   Reason for Co-Treatment: Complexity of the patient's impairments (multi-system involvement);Necessary to address cognition/behavior during functional activity;To address functional/ADL transfers   OT goals addressed during session: ADL's and self-care;Strengthening/ROM      AM-PAC PT "6 Clicks" Mobility   Outcome Measure  Help needed turning from your back to your side while in a flat bed without using bedrails?: Total Help needed moving from lying on your back to sitting on the side of a flat bed without using bedrails?: Total Help needed moving to and from a bed to a chair (including a wheelchair)?: Total Help needed standing up from a chair using your arms (e.g., wheelchair or bedside chair)?: Total Help needed to walk in hospital room?: Total Help needed climbing 3-5 steps with a railing? : Total 6 Click Score: 6    End of Session Equipment Utilized During  Treatment: Oxygen Activity Tolerance: Patient limited by fatigue;Patient limited by pain;Other (comment)(dizziness) Patient left: in bed;with call bell/phone within reach;with bed alarm set   PT Visit Diagnosis: Muscle weakness (generalized) (M62.81);Difficulty in walking, not elsewhere classified (R26.2);Dizziness and giddiness (R42);Pain Pain - Right/Left: (head) Pain - part of body: (head)     Time: [id number] PT Time Calculation (min) (ACUTE ONLY): 38 min  Charges:  $Therapeutic Activity: 8-22 mins                    Verdene Lennert, PT, DPT  Acute Rehabilitation [phone] pager #[phone] office     10/16/2019, 4:56 PM

## 2019-10-16 NOTE — Progress Notes (Signed)
NAME:  Casey Richard, MRN:  [id number], DOB:  10/21/1955, LOS: 72 ADMISSION DATE:  09/27/2019, CONSULTATION DATE:  10/13/2019 REFERRING MD:  Dr. Zada Finders, CHIEF COMPLAINT:  Acute Hypoxic Respiratory Distress   History of present illness   64 year old male presents to ED on 4/9 with gait instability, memory loss, and falls. CT Head with hypodense lesion with mass effect on pons, right cerebellum. Treated with steroids. 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 On 4/22 patient with lethargy. Taken for CT head which revealed post-operative hemorrhage with brainstem compression. Taken to OR for evacuation. 4/24 patient with acute hypoxic respiratory distress. CXR with bibasilar opacities with right greater then left, concerning for aspiration. PCCM consulted.   Past Medical History   Past Medical History:  Diagnosis Date  . Cardiac arrest ([place])   . CHB (complete heart block) ([place])   . 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 Dysrhythmia 1/13   bradycardia, VF, cardiac arrest/complete heart block with pacer inserted/LOV Dr Sallyanne Kuster 07/21/11 with interrogation and anesthesia guideline order on chart. Chest x ray 1/13 EPIC,, TEE and [facility]  1/13 EPIC  . Memory loss, short term    from fall with heart block 1/13  . Nonsustained ventricular tachycardia ([place]) 02/07/2014  . Presence of permanent cardiac pacemaker 07/15/2011   [employer] Revo  . Seasonal allergies   . Shortness of breath      Significant Hospital Events   4/9 > Admit 4/19> Resection  4/22 > Evacuation of hemorrhage  4/25 > PCCM Consulted   Significant Diagnostic Tests:  CT Head 4/09 > 1. No acute intracranial or calvarial findings. 2. 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 This most likely represents an arachnoid cyst or epidermoid, and could be symptomatic based on its  mass effect. Cystic neoplasm less likely. Recommend further evaluation with MRI of the brain without and with contrast (if possible; the patient has a pacemaker) and non emergent neuro surgical consultation. 3. No evidence of acute cervical spine fracture, traumatic subluxation or static signs of instability. 4. Mildly progressive cervical spondylosis MR Brain 4/12 > 1. 4.7 x 3.4 x 4.1 cm multi-cystic right cerebellopontine angle mass with associated abnormal enhancement extending into the right internal auditory canal. 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 Significant associated mass effect upon the right cerebellopontine and cerebellomedullary angle with partial effacement of the fourth ventricle and mild adjacent parenchymal edema. No hydrocephalus. 2. A 17 x 14 mm hypoenhancing mass within the left aspect of the sella has increased in size as compared MRI 02/15/2012. 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 Correlate with relevant laboratory values. 3. Mild chronic small vessel ischemic, progressed as compared to 02/15/2012. 4. Mild ethmoid sinus mucosal thickening. Small bilateral maxillary sinus mucous retention cysts. CT Head 4/22 > Right occipital craniectomy and cranioplasty related 2 debulking surgery for cystic mass of the right CP angle region. Gross measurements of the lesion today are 3.2 x 5.3 x 4.5 cm. Internal hemorrhage within the residual cystic lesion. Mass-effect upon the fourth ventricle with subsequent enlargement of the lateral and third ventricles consistent with mild hydrocephalus. Small amount of blood layering dependently in the occipital horns. Small amount of subdural blood along the inferior tentorium as well as dissecting inferiorly to and through the  foramen magnum to the C1 level with mild mass effect upon the brainstem. CXR 4//24 > 1. Bibasilar opacities,  right greater than left may represent aspiration or pneumonia. There may be associated effusion on the right. Recommend clinical correlation and short-term follow-up imaging to ensure resolution. 2. No other acute abnormalities.  Micro Data:  Sputum 4/25 >> Blood 4/25 >>   Antimicrobials:  Zosyn 4/25 >>  Interim history/subjective:  Uneventful tracheostomy yesterday.  Objective   Blood pressure (!) 142/86, pulse 94, temperature 98.4 F (36.9 C), temperature source Axillary, resp. rate 15, height 5\' 10"  (1.778 m), weight 69.5 kg, SpO2 100 %.    Vent Mode: PRVC FiO2 (%):  [40 %-100 %] 40 % Set Rate:  [16 bmp] 16 bmp Vt Set:  [580 mL] 580 mL PEEP:  [5 cmH20] 5 cmH20 Plateau Pressure:  [11 cmH20-17 cmH20] 11 cmH20   Intake/Output Summary (Last 24 hours) at 10/16/2019 0856 Last data filed at 10/16/2019 0800 Gross per 24 hour  Intake 1876.29 ml  Output 1600 ml  Net 276.29 ml   Filed Weights   10/12/19 0500 10/13/19 0500 10/15/19 0500  Weight: 67.9 kg 67.9 kg 69.5 kg    Examination: General: Adult male, calm, ventilated. HENT:  SBFT with no skin breakdown. Tracheostomy in place with no bleeding.  Lungs: chest clear bilaterally. Cardiovascular: Paced - HS normal Abdomen: Soft, non-tender, active bowel sounds  Extremities: no edema, superficial skin excoriation on left back of thigh. Neuro: sedated on precedex. Follows commands in all 4 limbs. Inattention. GU: intact, Foley catheter.   Assessment & Plan:   Critically ill due to Acute Hypoxic Respiratory Failure presumed secondary to aspiration PNA - failed HFNC due to inability to control secretions.  Location of tumor/bleed likely affecting pharyngeal function.  - Trach collar today.  - Progressive ambulation.  CPA Mass s/p Resection, with hemorrhage s/p evacuation  - Enteral BP control.  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  H/O Heart Block, V.Fib Arrest 2013 s/p  Pacemaker Plan -Cardiac Monitoring    Daily Goals Checklist  Pain/Anxiety/Delirium protocol (if indicated): Precedex stopped VAP protocol (if indicated): bundle in place. Respiratory support goals: Trach collar Blood pressure target: Oral treatment to keep SBP<160. DVT prophylaxis: Heparin tid to start today. Nutrition Status: Nutrition Problem: Severe Malnutrition Etiology: chronic illness Signs/Symptoms: severe muscle depletion, severe fat depletion, percent weight loss, energy intake < or equal to 75% for > or equal to 1 month Percent weight loss: 22 % Interventions: Ensure Enlive (each supplement provides 350kcal and 20 grams of protein), Magic cup, MVI, Liberalize Diet GI prophylaxis:  Famotidine Fluid status goals: negative fluid balance. Allow autoregulation. Urinary catheter: Assessment of intravascular volume and urinary retention. Will start Ureocholine Trial of voiding on 4/30 Central line: removed arterial line.  Glucose control: euglycemic on no therapy.  Mobility/therapy needs: PT/OT/SLP, up to chair Antibiotic de-escalation: Zosyn for 5 days for aspiration pneumonia. Home medication reconciliation: no relevant medications.  Daily labs: CBC, BNP Code Status: Full  Family Communication: Updated brother who has consented to tracheostomy tomorrow. Disposition: ICU    Labs   CBC: Recent Labs  Lab 10/10/19 0436 10/13/19 0900 10/13/19 0953 10/13/19 2352 10/14/19 0431 10/14/19 0626 10/14/19 0930 10/15/19 0635 10/16/19 0421  WBC 19.7*  --  19.2*  --  18.4*  --   --  11.8* 10.2  HGB 13.7   < > 12.0*   < > 11.4* 10.5* 10.5* 9.8* 10.3*  HCT  42.0   < > 36.2*   < > 34.5* 31.0* 31.0* 30.1* 31.6*  MCV 98.4  --  100.6*  --  98.9  --   --  101.7* 101.3*  PLT 176  --  161  --  187  --   --  164 164   < > = values in this interval not displayed.    Basic Metabolic Panel: Recent Labs  Lab 10/10/19 0904 10/12/19 0423 10/12/19 1634 10/13/19 0531 10/13/19 0900  10/13/19 0953 10/13/19 1700 10/13/19 2352 10/14/19 0431 10/14/19 0626 10/14/19 0930 10/15/19 0635 10/16/19 0421  NA 136  --   --   --    < > 144  --    < > 145 147* 147* 147* 147*  K 4.2  --   --   --    < > 4.1  --    < > 3.7 3.7 3.6 3.9 3.9  CL 97*  --   --   --   --  112*  --   --  114*  --   --  120* 116*  CO2 32  --   --   --   --  23  --   --  22  --   --  23 19*  GLUCOSE 125*  --   --   --   --  119*  --   --  126*  --   --  105* 138*  BUN 32*  --   --   --   --  35*  --   --  37*  --   --  34* 32*  CREATININE 0.70  --   --   --   --  0.56*  --   --  0.79  --   --  0.66 0.68  CALCIUM 8.6*  --   --   --   --  8.3*  --   --  8.1*  --   --  7.7* 7.8*  MG  --    < > 2.2 2.1  --   --  2.0  --   --   --   --  2.0 2.0  PHOS  --    < > 2.0* 1.8*  --   --  3.2  --   --   --   --  3.0 2.9   < > = values in this interval not displayed.   GFR: Estimated Creatinine Clearance: 90.5 mL/min (by C-G formula based on SCr of 0.68 mg/dL). Recent Labs  Lab 10/13/19 0953 10/14/19 0431 10/15/19 0635 10/16/19 0421  PROCALCITON 0.30  --   --   --   WBC 19.2* 18.4* 11.8* 10.2  LATICACIDVEN 0.6  --   --   --     Liver Function Tests: Recent Labs  Lab 10/10/19 0904 10/13/19 0953  AST 23 25  ALT 99* 48*  ALKPHOS 52 63  BILITOT 1.0 1.2  PROT 6.3* 5.4*  ALBUMIN 3.4* 2.5*   No results for input(s): LIPASE, AMYLASE in the last 168 hours. Recent Labs  Lab 10/10/19 0904  AMMONIA 24    ABG    Component Value Date/Time   PHART 7.424 10/14/2019 0930   PCO2ART 35.5 10/14/2019 0930   PO2ART 102 10/14/2019 0930   HCO3 23.0 10/14/2019 0930   TCO2 24 10/14/2019 0930   ACIDBASEDEF 1.0 10/14/2019 0930   O2SAT 98.0 10/14/2019 0930     Coagulation Profile: No results for  input(s): INR, PROTIME in the last 168 hours.  Cardiac Enzymes: No results for input(s): CKTOTAL, CKMB, CKMBINDEX, TROPONINI in the last 168 hours.  HbA1C: No results found for: HGBA1C  CBG: Recent Labs  Lab  10/15/19 1541 10/15/19 1924 10/15/19 2301 10/16/19 0319 10/16/19 0743  GLUCAP 90 102* 120* 131* 134*   CRITICAL CARE Performed by: Kipp Brood   Total critical care time: 40 minutes  Critical care time was exclusive of separately billable procedures and treating other patients.  Critical care was necessary to treat or prevent imminent or life-threatening deterioration.  Critical care was time spent personally by me on the following activities: development of treatment plan with patient and/or surrogate as well as nursing, discussions with consultants, evaluation of patient's response to treatment, examination of patient, obtaining history from patient or surrogate, ordering and performing treatments and interventions, ordering and review of laboratory studies, ordering and review of radiographic studies, pulse oximetry, re-evaluation of patient's condition and participation in multidisciplinary rounds.  Kipp Brood, MD [facility] ICU Physician [place]  Pager: [phone] Mobile: [phone] After hours: [phone].

## 2019-10-17 DIAGNOSIS — Z9911 Dependence on respirator [ventilator] status: Secondary | ICD-10-CM | POA: Diagnosis not present

## 2019-10-17 DIAGNOSIS — G9389 Other specified disorders of brain: Secondary | ICD-10-CM | POA: Diagnosis not present

## 2019-10-17 LAB — BASIC METABOLIC PANEL
Anion gap: 10 (ref 5–15)
BUN: 27 mg/dL — ABNORMAL HIGH (ref 8–23)
CO2: 24 mmol/L (ref 22–32)
Calcium: 8.1 mg/dL — ABNORMAL LOW (ref 8.9–10.3)
Chloride: 113 mmol/L — ABNORMAL HIGH (ref 98–111)
Creatinine, Ser: 0.59 mg/dL — ABNORMAL LOW (ref 0.61–1.24)
GFR calc Af Amer: >60 mL/min (ref >60)
GFR calc non Af Amer: >60 mL/min (ref >60)
Glucose, Bld: 108 mg/dL — ABNORMAL HIGH (ref 70–99)
Potassium: 4.2 mmol/L (ref 3.5–5.1)
Sodium: 147 mmol/L — ABNORMAL HIGH (ref 135–145)

## 2019-10-17 LAB — CBC
HCT: 34.8 % — ABNORMAL LOW (ref 39.0–52.0)
Hemoglobin: 11.3 g/dL — ABNORMAL LOW (ref 13.0–17.0)
MCH: 32.1 pg (ref 26.0–34.0)
MCHC: 32.5 g/dL (ref 30.0–36.0)
MCV: 98.9 fL (ref 80.0–100.0)
Platelets: 186 10*3/uL (ref 150–400)
RBC: 3.52 MIL/uL — ABNORMAL LOW (ref 4.22–5.81)
RDW: 13.6 % (ref 11.5–15.5)
WBC: 13.6 10*3/uL — ABNORMAL HIGH (ref 4.0–10.5)
nRBC: 0 % (ref 0.0–0.2)

## 2019-10-17 LAB — GLUCOSE, CAPILLARY
Glucose-Capillary: 107 mg/dL — ABNORMAL HIGH (ref 70–99)
Glucose-Capillary: 117 mg/dL — ABNORMAL HIGH (ref 70–99)
Glucose-Capillary: 101 mg/dL — ABNORMAL HIGH (ref 70–99)
Glucose-Capillary: 109 mg/dL — ABNORMAL HIGH (ref 70–99)
Glucose-Capillary: 109 mg/dL — ABNORMAL HIGH (ref 70–99)
Glucose-Capillary: 122 mg/dL — ABNORMAL HIGH (ref 70–99)

## 2019-10-17 LAB — PHOSPHORUS: Phosphorus: 3.2 mg/dL (ref 2.5–4.6)

## 2019-10-17 LAB — MAGNESIUM: Magnesium: 2.1 mg/dL (ref 1.7–2.4)

## 2019-10-17 MED ORDER — FENTANYL CITRATE (PF) 100 MCG/2ML IJ SOLN
INTRAMUSCULAR | Status: AC
  Filled 2019-10-17: qty 4

## 2019-10-17 MED ORDER — FENTANYL CITRATE (PF) 100 MCG/2ML IJ SOLN: 100.0000 ug | Freq: Once | INTRAMUSCULAR | Status: DC

## 2019-10-17 MED ORDER — MIDAZOLAM HCL 2 MG/2ML IJ SOLN
INTRAMUSCULAR | Status: AC
  Filled 2019-10-17: qty 4

## 2019-10-17 MED ORDER — VECURONIUM BROMIDE 10 MG IV SOLR
INTRAVENOUS | Status: AC
  Filled 2019-10-17: qty 10

## 2019-10-17 MED ORDER — VECURONIUM BROMIDE 10 MG IV SOLR: 10.0000 mg | Freq: Once | INTRAVENOUS | Status: DC

## 2019-10-17 MED ORDER — MIDAZOLAM HCL 2 MG/2ML IJ SOLN: 4.0000 mg | Freq: Once | INTRAMUSCULAR | Status: DC

## 2019-10-17 MED ORDER — LISINOPRIL 5 MG PO TABS
5.0000 mg | ORAL_TABLET | Freq: Every day | ORAL | Status: DC
  Filled 2019-10-17: qty 1

## 2019-10-17 MED ORDER — AMLODIPINE BESYLATE 10 MG PO TABS
10.0000 mg | ORAL_TABLET | Freq: Every day | ORAL | Status: DC
  Administered 2019-10-17 – 2019-11-14 (×20): 10 mg
  Filled 2019-10-17 (×26): qty 1

## 2019-10-17 MED ORDER — PROMETHAZINE HCL 25 MG PO TABS: 12.5000 mg | ORAL_TABLET | ORAL | Status: DC | PRN

## 2019-10-17 NOTE — Progress Notes (Signed)
NAME:  Casey Richard, MRN:  [id number], DOB:  1956/02/14, LOS: 78 ADMISSION DATE:  09/27/2019, CONSULTATION DATE:  10/13/2019 REFERRING MD:  Dr. Zada Finders, CHIEF COMPLAINT:  Acute Hypoxic Respiratory Distress   History of present illness   64 year old male presents to ED on 4/9 with gait instability, memory loss, and falls. CT Head with hypodense lesion with mass effect on pons, right cerebellum. Treated with steroids. 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 On 4/22 patient with lethargy. Taken for CT head which revealed post-operative hemorrhage with brainstem compression. Taken to OR for evacuation. 4/24 patient with acute hypoxic respiratory distress. CXR with bibasilar opacities with right greater then left, concerning for aspiration. PCCM consulted.   Past Medical History   Past Medical History:  Diagnosis Date  . Cardiac arrest ([place])   . CHB (complete heart block) ([place])   . 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 Dysrhythmia 1/13   bradycardia, VF, cardiac arrest/complete heart block with pacer inserted/LOV Dr Sallyanne Kuster 07/21/11 with interrogation and anesthesia guideline order on chart. Chest x ray 1/13 EPIC,, TEE and [facility]  1/13 EPIC  . Memory loss, short term    from fall with heart block 1/13  . Nonsustained ventricular tachycardia ([place]) 02/07/2014  . Presence of permanent cardiac pacemaker 07/15/2011   Medtronic Revo  . Seasonal allergies   . Shortness of breath      Significant Hospital Events   4/9 > Admit 4/19> Resection  4/22 > Evacuation of hemorrhage  4/25 > PCCM Consulted   Significant Diagnostic Tests:  CT Head 4/09 > 1. No acute intracranial or calvarial findings. 2. 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 This most likely represents an arachnoid cyst or epidermoid, and could be symptomatic based on its  mass effect. Cystic neoplasm less likely. Recommend further evaluation with MRI of the brain without and with contrast (if possible; the patient has a pacemaker) and non emergent neuro surgical consultation. 3. No evidence of acute cervical spine fracture, traumatic subluxation or static signs of instability. 4. Mildly progressive cervical spondylosis MR Brain 4/12 > 1. 4.7 x 3.4 x 4.1 cm multi-cystic right cerebellopontine angle mass with associated abnormal enhancement extending into the right internal auditory canal. 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 Significant associated mass effect upon the right cerebellopontine and cerebellomedullary angle with partial effacement of the fourth ventricle and mild adjacent parenchymal edema. No hydrocephalus. 2. A 17 x 14 mm hypoenhancing mass within the left aspect of the sella has increased in size as compared MRI 02/15/2012. 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 Correlate with relevant laboratory values. 3. Mild chronic small vessel ischemic, progressed as compared to 02/15/2012. 4. Mild ethmoid sinus mucosal thickening. Small bilateral maxillary sinus mucous retention cysts. CT Head 4/22 > Right occipital craniectomy and cranioplasty related 2 debulking surgery for cystic mass of the right CP angle region. Gross measurements of the lesion today are 3.2 x 5.3 x 4.5 cm. Internal hemorrhage within the residual cystic lesion. Mass-effect upon the fourth ventricle with subsequent enlargement of the lateral and third ventricles consistent with mild hydrocephalus. Small amount of blood layering dependently in the occipital horns. Small amount of subdural blood along the inferior tentorium as well as dissecting inferiorly to and through the  foramen magnum to the C1 level with mild mass effect upon the brainstem. CXR 4//24 > 1. Bibasilar opacities,  right greater than left may represent aspiration or pneumonia. There may be associated effusion on the right. Recommend clinical correlation and short-term follow-up imaging to ensure resolution. 2. No other acute abnormalities.  Micro Data:  Sputum 4/25 >> Blood 4/25 >>   Antimicrobials:  Zosyn 4/25 >>  Interim history/subjective:  Tolerating trach collar trials.  For PEG tube today with surgery.   Objective   Blood pressure (!) 161/103, pulse 87, temperature 99.9 F (37.7 C), temperature source Oral, resp. rate 19, height 5\' 10"  (1.778 m), weight 69.5 kg, SpO2 96 %.    Vent Mode: PRVC FiO2 (%):  [35 %-40 %] 35 % Set Rate:  [16 bmp] 16 bmp Vt Set:  [580 mL] 580 mL PEEP:  [5 cmH20] 5 cmH20 Plateau Pressure:  [10 cmH20-14 cmH20] 14 cmH20   Intake/Output Summary (Last 24 hours) at 10/17/2019 0932 Last data filed at 10/17/2019 0600 Gross per 24 hour  Intake 1250.4 ml  Output 1500 ml  Net -249.6 ml   Filed Weights   10/12/19 0500 10/13/19 0500 10/15/19 0500  Weight: 67.9 kg 67.9 kg 69.5 kg    Examination: General: Adult male, calm, on trach collar. HENT:  SBFT with no skin breakdown. Tracheostomy in place with no bleeding.  Lungs: chest clear bilaterally. Cardiovascular: Paced - HS normal Abdomen: Soft, non-tender, active bowel sounds  Extremities: no edema, superficial skin excoriation on left back of thigh. Neuro: awake and following command, able to write. Right ptosis. GU: intact, Foley catheter.   Assessment & Plan:   Critically ill due to Acute Hypoxic Respiratory Failure presumed secondary to aspiration PNA - failed HFNC due to inability to control secretions.  Location of tumor/bleed likely affecting pharyngeal function.  - Open ended trach collar trial. - Progressive ambulation.  CPA Mass s/p Resection, with hemorrhage s/p evacuation  - Enteral BP control.  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  H/O Heart Block,  V.Fib Arrest 2013 s/p Pacemaker Plan -Cardiac Monitoring    Daily Goals Checklist  Pain/Anxiety/Delirium protocol (if indicated): prn analgesia only  VAP protocol (if indicated): bundle in place. Respiratory support goals: Trach collar Blood pressure target: Oral treatment to keep SBP<160. DVT prophylaxis: Heparin tid to start today. Nutrition Status: Nutrition Problem: Severe Malnutrition Etiology: chronic illness Signs/Symptoms: severe muscle depletion, severe fat depletion, percent weight loss, energy intake < or equal to 75% for > or equal to 1 month Percent weight loss: 22 % Interventions: Ensure Enlive (each supplement provides 350kcal and 20 grams of protein), Magic cup, MVI, Liberalize Diet GI prophylaxis:  Famotidine Fluid status goals: negative fluid balance. Allow autoregulation. Urinary catheter: Assessment of intravascular volume and urinary retention. Will start Ureocholine Trial of voiding on 4/30 Central line: removed arterial line.  Glucose control: euglycemic on no therapy.  Mobility/therapy needs: PT/OT/SLP, up to chair Antibiotic de-escalation: Zosyn for 5 days for aspiration pneumonia. Home medication reconciliation: no relevant medications.  Daily labs: CBC, BNP Code Status: Full  Family Communication:will update brother Disposition: ICU - to PCU if remains on TC 48h.    Labs   CBC: Recent Labs  Lab 10/13/19 0953 10/13/19 2352 10/14/19 0431 10/14/19 0431 10/14/19 0626 10/14/19 0930 10/15/19 0635 10/16/19 0421 10/17/19 0254  WBC 19.2*  --  18.4*  --   --   --  11.8* 10.2 13.6*  HGB 12.0*   < >  11.4*   < > 10.5* 10.5* 9.8* 10.3* 11.3*  HCT 36.2*   < > 34.5*   < > 31.0* 31.0* 30.1* 31.6* 34.8*  MCV 100.6*  --  98.9  --   --   --  101.7* 101.3* 98.9  PLT 161  --  187  --   --   --  164 164 186   < > = values in this interval not displayed.    Basic Metabolic Panel: Recent Labs  Lab 10/13/19 0531 10/13/19 0900 10/13/19 0953 10/13/19 1700  10/13/19 2352 10/14/19 0431 10/14/19 0431 10/14/19 0626 10/14/19 0930 10/15/19 0635 10/16/19 0421 10/17/19 0254  NA  --    < > 144  --    < > 145   < > 147* 147* 147* 147* 147*  K  --    < > 4.1  --    < > 3.7   < > 3.7 3.6 3.9 3.9 4.2  CL  --   --  112*  --   --  114*  --   --   --  120* 116* 113*  CO2  --   --  23  --   --  22  --   --   --  23 19* 24  GLUCOSE  --   --  119*  --   --  126*  --   --   --  105* 138* 108*  BUN  --   --  35*  --   --  37*  --   --   --  34* 32* 27*  CREATININE  --   --  0.56*  --   --  0.79  --   --   --  0.66 0.68 0.59*  CALCIUM  --   --  8.3*  --   --  8.1*  --   --   --  7.7* 7.8* 8.1*  MG 2.1  --   --  2.0  --   --   --   --   --  2.0 2.0 2.1  PHOS 1.8*  --   --  3.2  --   --   --   --   --  3.0 2.9 3.2   < > = values in this interval not displayed.   GFR: Estimated Creatinine Clearance: 90.5 mL/min (A) (by C-G formula based on SCr of 0.59 mg/dL (L)). Recent Labs  Lab 10/13/19 0953 10/13/19 0953 10/14/19 0431 10/15/19 0635 10/16/19 0421 10/17/19 0254  PROCALCITON 0.30  --   --   --   --   --   WBC 19.2*   < > 18.4* 11.8* 10.2 13.6*  LATICACIDVEN 0.6  --   --   --   --   --    < > = values in this interval not displayed.    Liver Function Tests: Recent Labs  Lab 10/13/19 0953  AST 25  ALT 48*  ALKPHOS 63  BILITOT 1.2  PROT 5.4*  ALBUMIN 2.5*   No results for input(s): LIPASE, AMYLASE in the last 168 hours. No results for input(s): AMMONIA in the last 168 hours.  ABG    Component Value Date/Time   PHART 7.424 10/14/2019 0930   PCO2ART 35.5 10/14/2019 0930   PO2ART 102 10/14/2019 0930   HCO3 23.0 10/14/2019 0930   TCO2 24 10/14/2019 0930   ACIDBASEDEF 1.0 10/14/2019 0930   O2SAT 98.0 10/14/2019 0930  Coagulation Profile: No results for input(s): INR, PROTIME in the last 168 hours.  Cardiac Enzymes: No results for input(s): CKTOTAL, CKMB, CKMBINDEX, TROPONINI in the last 168 hours.  HbA1C: No results found for:  HGBA1C  CBG: Recent Labs  Lab 10/16/19 1612 10/16/19 1928 10/16/19 2320 10/17/19 0320 10/17/19 [place], MD [facility] ICU Physician [place]  Pager: [phone] Mobile: [phone] After hours: [phone].

## 2019-10-17 NOTE — Progress Notes (Signed)
SLP Cancellation Note  Patient Details Name: Casey Richard MRN: [id number] DOB: [birth year]   Cancelled treatment:       Reason Eval/Treat Not Completed: Patient at procedure or test/unavailable. Pt currently receiving nursing care with PEG planned for 10. Will f/u at another time; note that he is currently on TC.     Osie Bond., M.A. [place] [facility] Pager [phone] Office [phone]  10/17/2019, 9:35 AM

## 2019-10-17 NOTE — Progress Notes (Signed)
Physical Therapy Treatment Patient Details Name: Casey Richard MRN: [id number] DOB: May 10, 1956 Today's Date: 10/17/2019    History of Present Illness 64 y/o male admitted secondary to dizziness, weight loss, and gait instability. 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  MRI confirmed 2 large brain masses. S/p right crani for tumor excision on 10/07/19. PMH includes CKD and complete heart block s/p pacemaker. Neuro status changes s/p evacuation of post operative hematoma on 10/10/19. Patient then with acute hypoxic respiratory distress. CXR with bibasilar opacities with right greater then left, concerning for aspiration, re-intubated 4/26, s/p tracheostomy 4/27.  Trach collar on 4/29.      PT Comments    Pt much more alert today, following commands, looking stronger with his mobility in general.  Pt mobilized EOB and OOB to chair using the stedy standing frame with min assist overall.  I believe he could have done it without the standing frame, but it was safer since I was by myself to help me manage his many lines.  He will likely be ready for gait with or without AD next session.  PT will continue to follow acutely for safe mobility progression.   Follow Up Recommendations  CIR     Equipment Recommendations  Rolling walker with 5" wheels;3in1 (PT);Hospital bed;Wheelchair (measurements PT);Wheelchair cushion (measurements PT)    Recommendations for Other Services Rehab consult     Precautions / Restrictions Precautions Precautions: Fall    Mobility  Bed Mobility Overal bed mobility: Needs Assistance Bed Mobility: Supine to Sit     Supine to sit: Min assist;HOB elevated     General bed mobility comments: Min hand held assist to come up to sitting EOB,  Pt able to move bil legs to the edge unassisted.  HOB maximally elevated.   Transfers Overall transfer level: Needs assistance   Transfers: Sit to/from  Stand;Stand Pivot Transfers Sit to Stand: Min assist;From elevated surface Stand pivot transfers: (seated on stedy)       General transfer comment: Min assist to stand from mildly elevated bed x 5 using stedy standing frame for support, shuffle stepped in place.  Rode the stedy to get to the chair for safety due to numerous lines.  Pt could have likely pivoted to the chair, but second person was needed for lines and RN was in another room.   Ambulation/Gait             General Gait Details: Should be ready for gait next session.           Balance Overall balance assessment: Needs assistance Sitting-balance support: Feet supported;Bilateral upper extremity supported Sitting balance-Leahy Scale: Fair Sitting balance - Comments: close supervision EOB, tends to tip backwards when scooting, but rights himself. Postural control: Posterior lean Standing balance support: Bilateral upper extremity supported Standing balance-Leahy Scale: Poor Standing balance comment: holding to standing frame in standing.  Min guard assist when standing in frame due to impulsivity (pt restlessly shuffling around in standing).                             Cognition Arousal/Alertness: Awake/alert Behavior During Therapy: Flat affect;Impulsive Overall Cognitive Status: Difficult to assess                                 General Comments: Pt is a  bit quick to move, reports he remembers me from previous sessions, can show me how to use to call bell to call the RN and is writing some things on a piece of paper to communicate.  His spelling is a bit off and his letters are huge.           General Comments General comments (skin integrity, edema, etc.): VSS on 35% TC 8L O2, bloody sputum through trach externally suctioned.  R UE remains very edematous.  RN made aware and that the edema was new since trach      Pertinent Vitals/Pain Pain Assessment: Faces Faces Pain Scale: Hurts  little more Pain Location: head Pain Descriptors / Indicators: Grimacing Pain Intervention(s): Limited activity within patient's tolerance;Monitored during session;Repositioned           PT Goals (current goals can now be found in the care plan section) Acute Rehab PT Goals Patient Stated Goal: to eat again and walk Progress towards PT goals: Progressing toward goals    Frequency    Min 3X/week      PT Plan Current plan remains appropriate       AM-PAC PT "6 Clicks" Mobility   Outcome Measure  Help needed turning from your back to your side while in a flat bed without using bedrails?: A Little Help needed moving from lying on your back to sitting on the side of a flat bed without using bedrails?: A Little Help needed moving to and from a bed to a chair (including a wheelchair)?: A Lot Help needed standing up from a chair using your arms (e.g., wheelchair or bedside chair)?: A Little Help needed to walk in hospital room?: A Lot Help needed climbing 3-5 steps with a railing? : Total 6 Click Score: 14    End of Session Equipment Utilized During Treatment: Oxygen Activity Tolerance: Patient tolerated treatment well Patient left: in chair;with call bell/phone within reach;with chair alarm set Nurse Communication: Mobility status;Need for lift equipment PT Visit Diagnosis: Muscle weakness (generalized) (M62.81);Difficulty in walking, not elsewhere classified (R26.2);Dizziness and giddiness (R42);Pain Pain - Right/Left: (head) Pain - part of body: (head)     Time: [id number] PT Time Calculation (min) (ACUTE ONLY): 39 min  Charges:  $Therapeutic Activity: 38-52 mins          Verdene Lennert, PT, DPT  Acute Rehabilitation [phone] pager #[phone] office               10/17/2019, 5:20 PM

## 2019-10-17 NOTE — Progress Notes (Signed)
Patient placed on 35% ATC. Patient tolerated well. Patient was suctioned before and has good strong cough. Vitals are stable. RN made aware.

## 2019-10-17 NOTE — Progress Notes (Addendum)
Neurosurgery Service Progress Note  Subjective: NAE ON, on TC overnight and doing well, some xerostomia this morning o/w no complaints  Objective: Vitals:   10/17/19 0500 10/17/19 0600 10/17/19 0754 10/17/19 0800  BP: (!) 150/88 (!) 161/103    Pulse: 78 86 87   Resp: '16 16 19   ' Temp:    99.9 F (37.7 C)  TempSrc:    Oral  SpO2: 98% 97% 96%   Weight:      Height:       Temp (24hrs), Avg:99.4 F (37.4 C), Min:98.5 F (36.9 C), Max:100.3 F (37.9 C)  CBC Latest Ref Rng & Units 10/17/2019 10/16/2019 10/15/2019  WBC 4.0 - 10.5 K/uL 13.6(H) 10.2 11.8(H)  Hemoglobin 13.0 - 17.0 g/dL 11.3(L) 10.3(L) 9.8(L)  Hematocrit 39.0 - 52.0 % 34.8(L) 31.6(L) 30.1(L)  Platelets 150 - 400 K/uL 186 164 164   BMP Latest Ref Rng & Units 10/17/2019 10/16/2019 10/15/2019  Glucose 70 - 99 mg/dL 108(H) 138(H) 105(H)  BUN 8 - 23 mg/dL 27(H) 32(H) 34(H)  Creatinine 0.61 - 1.24 mg/dL 0.59(L) 0.68 0.66  Sodium 135 - 145 mmol/L 147(H) 147(H) 147(H)  Potassium 3.5 - 5.1 mmol/L 4.2 3.9 3.9  Chloride 98 - 111 mmol/L 113(H) 116(H) 120(H)  CO2 22 - 32 mmol/L 24 19(L) 23  Calcium 8.9 - 10.3 mg/dL 8.1(L) 7.8(L) 7.7(L)    Intake/Output Summary (Last 24 hours) at 10/17/2019 0909 Last data filed at 10/17/2019 0600 Gross per 24 hour  Intake 1250.4 ml  Output 1500 ml  Net -249.6 ml    Current Facility-Administered Medications:  .  0.9 %  sodium chloride infusion, , Intravenous, PRN, Judith Part, MD, Stopped at 10/16/19 0328 .  acetaminophen (TYLENOL) 160 MG/5ML solution 650 mg, 650 mg, Per Tube, Q4H PRN, Ashok Pall, MD, 650 mg at 10/15/19 0916 .  [DISCONTINUED] acetaminophen (TYLENOL) tablet 650 mg, 650 mg, Oral, Q4H PRN **OR** acetaminophen (TYLENOL) suppository 650 mg, 650 mg, Rectal, Q4H PRN, Amyiah Gaba A, MD .  amLODipine (NORVASC) tablet 5 mg, 5 mg, Per Tube, Daily, Omar Person, NP, 5 mg at 10/16/19 0916 .  bethanechol (URECHOLINE) tablet 10 mg, 10 mg, Per Tube, TID, Kipp Brood,  MD, 10 mg at 10/16/19 2142 .  chlorhexidine gluconate (MEDLINE KIT) (PERIDEX) 0.12 % solution 15 mL, 15 mL, Mouth Rinse, BID, Agarwala, Ravi, MD, 15 mL at 10/17/19 0837 .  Chlorhexidine Gluconate Cloth 2 % PADS 6 each, 6 each, Topical, Daily, Judith Part, MD, 6 each at 10/16/19 1047 .  docusate (COLACE) 50 MG/5ML liquid 100 mg, 100 mg, Per Tube, BID, Ashok Pall, MD, 100 mg at 10/16/19 2142 .  feeding supplement (OSMOLITE 1.2 CAL) liquid 1,000 mL, 1,000 mL, Per Tube, Continuous, Krystel Fletchall, Joyice Faster, MD, Last Rate: 70 mL/hr at 10/17/19 0000, Rate Verify at 10/17/19 0000 .  feeding supplement (PRO-STAT SUGAR FREE 64) liquid 30 mL, 30 mL, Per Tube, Daily, Judith Part, MD, 30 mL at 10/16/19 0916 .  fentaNYL (SUBLIMAZE) injection 100 mcg, 100 mcg, Intravenous, Once, Georganna Skeans, MD .  heparin injection 5,000 Units, 5,000 Units, Subcutaneous, Q8H, Kipp Brood, MD, 5,000 Units at 10/17/19 0521 .  HYDROcodone-acetaminophen (NORCO/VICODIN) 5-325 MG per tablet 1 tablet, 1 tablet, Per Tube, Q4H PRN, Ashok Pall, MD .  HYDROmorphone (DILAUDID) injection 0.5 mg, 0.5 mg, Intravenous, Q3H PRN, Judith Part, MD, 0.5 mg at 10/17/19 0107 .  hydroxypropyl methylcellulose / hypromellose (ISOPTO TEARS / GONIOVISC) 2.5 % ophthalmic solution 1 drop, 1 drop,  Right Eye, Q2H, Judith Part, MD, 1 drop at 10/17/19 [phone] .  labetalol (NORMODYNE) injection 10-40 mg, 10-40 mg, Intravenous, Q10 min PRN, Judith Part, MD, 40 mg at 10/17/19 0344 .  MEDLINE mouth rinse, 15 mL, Mouth Rinse, 10 times per day, Kipp Brood, MD, 15 mL at 10/17/19 0610 .  midazolam (VERSED) injection 4 mg, 4 mg, Intravenous, Once, Georganna Skeans, MD .  ondansetron [facility]) tablet 4 mg, 4 mg, Oral, Q4H PRN **OR** ondansetron (ZOFRAN) injection 4 mg, 4 mg, Intravenous, Q4H PRN, Alyza Artiaga A, MD .  piperacillin-tazobactam (ZOSYN) IVPB 3.375 g, 3.375 g, Intravenous, Q8H, Agarwala, Ravi, MD, Last Rate:  12.5 mL/hr at 10/17/19 0346, 3.375 g at 10/17/19 0346 .  polyethylene glycol (MIRALAX / GLYCOLAX) packet 17 g, 17 g, Per Tube, BID, Agarwala, Ravi, MD, 17 g at 10/16/19 1148 .  promethazine (PHENERGAN) tablet 12.5-25 mg, 12.5-25 mg, Per Tube, Q4H PRN, Judith Part, MD .  vecuronium (NORCURON) injection 10 mg, 10 mg, Intravenous, Once, Georganna Skeans, MD   Physical Exam: On TC, FCx4 and interactive, R eye taped shut, stable facial palsy  Assessment & Plan: 64 y.o. 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 MRI with good debulking of tumor, pre-post op measurements show decrease in tumor volume from 19.7cc-->4.3cc. 4/22 progressive obtundation, CTH with post-op resection cavity hemorrhage not present on MRI, brainstem compression and developing obstructive hydrocephalus, taken back to OR for hematoma removal and hemostasis. Unfortunately, as expected, post-op cranial nerve dysfunction after hematoma evacuation despite normal intra-op stimulation. Post-op CTH with improved mass effect on brainstem, slowly improving ventricular size with some IVH, 4/26 CTH stable ventricular size, hemorrhage resolving, 4/26 intubated for aspiration / PNA, 4/27 trach  -PEG today, defer to CCM but I assume out of the unit tomorrow if stable on trach collar x24h -CCM weaning vent to TC, on zosyn long-infusion for aspiration PNA -eye drops to R eye, tape shut while asleep to protect cornea given facial weakness -final path cystic schwannoma -PT/OT rec CIR, will c/s PM&R for evaluation -SCDs/TEDs, SQH  Judith Part  10/17/19 9:09 AM

## 2019-10-17 NOTE — Progress Notes (Signed)
Nutrition Follow-up  DOCUMENTATION CODES:   Severe malnutrition in context of chronic illness  INTERVENTION:   Resume Osmolite 1.2 @ 70 ml/hr via PEG tube  30 ml Prostat daily  Provides: 2116 kcal, 108 grams protein, and 1362 ml free water.    NUTRITION DIAGNOSIS:   Severe Malnutrition related to chronic illness as evidenced by severe muscle depletion, severe fat depletion, percent weight loss, energy intake < or equal to 75% for > or equal to 1 month.  Ongoing.  GOAL:   Patient will meet greater than or equal to 90% of their needs  Meeting with TF  MONITOR:   PO intake, Supplement acceptance, Weight trends, Labs  REASON FOR ASSESSMENT:   Consult Enteral/tube feeding initiation and management  ASSESSMENT:   Pt with a PMH significant for complete heart block s/p PPM who presented to the hospital for worsening dizziness, gait instability, and weight loss. MRI revealed 2 large brain masses.  Per chart review pt on trach collar over night. Plan for PEG today. Possible transfer to progressive care per CCM.   4/19 s/p RIGHT CRANIOTOMY TUMOR EXCISION 4/22 s/p repeat crani for evacuation of hematoma after tumor excision  4/23 failed swallow, Cortrak placed; tip gastric  4/26 intubated 4/27 trach 4/29 planned PEG placement    Labs reviewed: Na 147 (H) Medications reviewed and include: colace, miralax    Diet Order:   Diet Order            Diet NPO time specified  Diet effective midnight              EDUCATION NEEDS:   Not appropriate for education at this time  Skin:  Skin Assessment: Skin Integrity Issues: Skin Integrity Issues:: Incisions Incisions: head  Last BM:  4/29 x 3 type 1  Height:   Ht Readings from Last 1 Encounters:  10/14/19 5\' 10"  (1.778 m)    Weight:   Wt Readings from Last 1 Encounters:  10/15/19 69.5 kg    BMI:  Body mass index is 21.98 kg/m.  Estimated Nutritional Needs:   Kcal:  2050-2200  Protein:  100-115  grams  Fluid:  >2L/d  Zeric Baranowski P., RD, LDN, CNSC See AMiON for contact information

## 2019-10-17 NOTE — Progress Notes (Signed)
Patient ID: Casey Richard, male   DOB: Jul 13, 1955, 64 y.o.   MRN: [id number] I called his brother Eldon per [facility] POA papers and discussed the planned PEG placement. I discussed the procedure, risks, and benefits. He agrees. Consent documented.  Georganna Skeans, MD, MPH, FACS Please use [email or web address] to contact on call provider

## 2019-10-18 ENCOUNTER — Inpatient Hospital Stay (HOSPITAL_COMMUNITY): Payer: Medicare Other

## 2019-10-18 ENCOUNTER — Encounter (HOSPITAL_COMMUNITY)
Admission: EM | Disposition: A | Discharge status: Skilled Nursing Facility | Payer: Self-pay | Source: Home / Self Care | Attending: Neurological Surgery

## 2019-10-18 DIAGNOSIS — R0902 Hypoxemia: Secondary | ICD-10-CM

## 2019-10-18 DIAGNOSIS — G9389 Other specified disorders of brain: Secondary | ICD-10-CM | POA: Diagnosis not present

## 2019-10-18 DIAGNOSIS — Z930 Tracheostomy status: Secondary | ICD-10-CM | POA: Diagnosis not present

## 2019-10-18 HISTORY — PX: PEG PLACEMENT: SHX5437

## 2019-10-18 HISTORY — PX: ESOPHAGOGASTRODUODENOSCOPY: SHX5428

## 2019-10-18 LAB — GLUCOSE, CAPILLARY
Glucose-Capillary: 96 mg/dL (ref 70–99)
Glucose-Capillary: 93 mg/dL (ref 70–99)
Glucose-Capillary: 87 mg/dL (ref 70–99)
Glucose-Capillary: 106 mg/dL — ABNORMAL HIGH (ref 70–99)
Glucose-Capillary: 126 mg/dL — ABNORMAL HIGH (ref 70–99)
Glucose-Capillary: 121 mg/dL — ABNORMAL HIGH (ref 70–99)

## 2019-10-18 LAB — CULTURE, BLOOD (ROUTINE X 2)
Culture: NO GROWTH
Culture: NO GROWTH
Special Requests: ADEQUATE
Special Requests: ADEQUATE

## 2019-10-18 SURGERY — EGD (ESOPHAGOGASTRODUODENOSCOPY)

## 2019-10-18 MED ORDER — QUETIAPINE FUMARATE 25 MG PO TABS: 50.0000 mg | ORAL_TABLET | Freq: Every day | ORAL | Status: DC

## 2019-10-18 MED ORDER — FENTANYL CITRATE (PF) 100 MCG/2ML IJ SOLN
INTRAMUSCULAR | Status: AC
  Administered 2019-10-18: 50 ug via INTRAVENOUS
  Filled 2019-10-18: qty 2

## 2019-10-18 MED ORDER — MIDAZOLAM HCL 2 MG/2ML IJ SOLN: 10.0000 mg | Freq: Once | INTRAMUSCULAR | Status: AC

## 2019-10-18 MED ORDER — FENTANYL CITRATE (PF) 100 MCG/2ML IJ SOLN: 100.0000 ug | Freq: Once | INTRAMUSCULAR | Status: AC

## 2019-10-18 MED ORDER — TROPICAMIDE 1 % OP SOLN
1.0000 [drp] | Freq: Once | OPHTHALMIC | Status: AC
  Administered 2019-10-18: 1 [drp] via OPHTHALMIC
  Filled 2019-10-18: qty 15

## 2019-10-18 MED ORDER — QUETIAPINE FUMARATE 25 MG PO TABS
50.0000 mg | ORAL_TABLET | Freq: Every day | ORAL | Status: DC
  Filled 2019-10-18: qty 2

## 2019-10-18 MED ORDER — QUETIAPINE FUMARATE 50 MG PO TABS
50.0000 mg | ORAL_TABLET | Freq: Every day | ORAL | Status: DC
  Administered 2019-10-18 – 2019-10-27 (×10): 50 mg
  Filled 2019-10-18 (×8): qty 1
  Filled 2019-10-18: qty 2

## 2019-10-18 MED ORDER — MIDAZOLAM HCL 2 MG/2ML IJ SOLN
INTRAMUSCULAR | Status: AC
  Administered 2019-10-18: 6 mg via INTRAVENOUS
  Filled 2019-10-18: qty 10

## 2019-10-18 MED ORDER — ERYTHROMYCIN 5 MG/GM OP OINT
TOPICAL_OINTMENT | Freq: Two times a day (BID) | OPHTHALMIC | Status: AC
  Filled 2019-10-18 (×2): qty 3.5

## 2019-10-18 MED ORDER — PANTOPRAZOLE SODIUM 40 MG PO TBEC
40.0000 mg | DELAYED_RELEASE_TABLET | Freq: Every day | ORAL | Status: DC
  Filled 2019-10-18: qty 1

## 2019-10-18 MED ORDER — NEOMYCIN-POLYMYXIN-DEXAMETH 3.5-10000-0.1 OP OINT
TOPICAL_OINTMENT | Freq: Three times a day (TID) | OPHTHALMIC | Status: AC
  Administered 2019-10-19 – 2019-10-20 (×3): 1 via OPHTHALMIC
  Filled 2019-10-18 (×2): qty 3.5

## 2019-10-18 MED ORDER — PROPARACAINE HCL 0.5 % OP SOLN
1.0000 [drp] | Freq: Once | OPHTHALMIC | Status: AC
  Administered 2019-10-18: 1 [drp] via OPHTHALMIC
  Filled 2019-10-18: qty 15

## 2019-10-18 NOTE — Progress Notes (Signed)
Neurosurgery Service Progress Note  Subjective: couldn't get a PEG yesterday, scheduled for today, no issues overnight  Objective: Vitals:   10/18/19 0500 10/18/19 0600 10/18/19 0738 10/18/19 0800  BP: (!) 164/102 (!) 152/78    Pulse: (!) 106  99   Resp: _0 Temp:    (!) 101.7 F (38.7 C)  TempSrc:    Oral  SpO2: 98%  96%   Weight:      Height:       Temp (24hrs), Avg:99.1 F (37.3 C), Min:98.3 F (36.8 C), Max:101.7 F (38.7 C)  CBC Latest Ref Rng & Units 10/17/2019 10/16/2019 10/15/2019  WBC 4.0 - 10.5 K/uL 13.6(H) 10.2 11.8(H)  Hemoglobin 13.0 - 17.0 g/dL 11.3(L) 10.3(L) 9.8(L)  Hematocrit 39.0 - 52.0 % 34.8(L) 31.6(L) 30.1(L)  Platelets 150 - 400 K/uL 186 164 164   BMP Latest Ref Rng & Units 10/17/2019 10/16/2019 10/15/2019  Glucose 70 - 99 mg/dL 108(H) 138(H) 105(H)  BUN 8 - 23 mg/dL 27(H) 32(H) 34(H)  Creatinine 0.61 - 1.24 mg/dL 0.59(L) 0.68 0.66  Sodium 135 - 145 mmol/L 147(H) 147(H) 147(H)  Potassium 3.5 - 5.1 mmol/L 4.2 3.9 3.9  Chloride 98 - 111 mmol/L 113(H) 116(H) 120(H)  CO2 22 - 32 mmol/L 24 19(L) 23  Calcium 8.9 - 10.3 mg/dL 8.1(L) 7.8(L) 7.7(L)    Intake/Output Summary (Last 24 hours) at 10/18/2019 0934 Last data filed at 10/18/2019 0600 Gross per 24 hour  Intake 608.87 ml  Output 2000 ml  Net -1391.13 ml    Current Facility-Administered Medications:  .  0.9 %  sodium chloride infusion, , Intravenous, PRN, Judith Part, MD, Stopped at 10/16/19 0328 .  acetaminophen (TYLENOL) 160 MG/5ML solution 650 mg, 650 mg, Per Tube, Q4H PRN, Ashok Pall, MD, 650 mg at 10/17/19 1246 .  [DISCONTINUED] acetaminophen (TYLENOL) tablet 650 mg, 650 mg, Oral, Q4H PRN **OR** acetaminophen (TYLENOL) suppository 650 mg, 650 mg, Rectal, Q4H PRN, Judith Part, MD .  amLODipine (NORVASC) tablet 10 mg, 10 mg, Per Tube, Daily, Agarwala, Ravi, MD, 10 mg at 10/17/19 1158 .  bethanechol (URECHOLINE) tablet 10 mg, 10 mg, Per Tube, TID, Kipp Brood, MD, 10 mg at  10/17/19 2224 .  chlorhexidine gluconate (MEDLINE KIT) (PERIDEX) 0.12 % solution 15 mL, 15 mL, Mouth Rinse, BID, Agarwala, Ravi, MD, 15 mL at 10/18/19 0858 .  Chlorhexidine Gluconate Cloth 2 % PADS 6 each, 6 each, Topical, Daily, Judith Part, MD, 6 each at 10/17/19 0900 .  docusate (COLACE) 50 MG/5ML liquid 100 mg, 100 mg, Per Tube, BID, Ashok Pall, MD, 100 mg at 10/16/19 2142 .  feeding supplement (OSMOLITE 1.2 CAL) liquid 1,000 mL, 1,000 mL, Per Tube, Continuous, Cai Anfinson, Joyice Faster, MD, Last Rate: 70 mL/hr at 10/17/19 1201, 1,000 mL at 10/17/19 1201 .  feeding supplement (PRO-STAT SUGAR FREE 64) liquid 30 mL, 30 mL, Per Tube, Daily, Ladaisha Portillo A, MD, 30 mL at 10/17/19 1158 .  heparin injection 5,000 Units, 5,000 Units, Subcutaneous, Q8H, Agarwala, Ravi, MD, 5,000 Units at 10/18/19 0610 .  HYDROcodone-acetaminophen (NORCO/VICODIN) 5-325 MG per tablet 1 tablet, 1 tablet, Per Tube, Q4H PRN, Ashok Pall, MD, 1 tablet at 10/17/19 1529 .  HYDROmorphone (DILAUDID) injection 0.5 mg, 0.5 mg, Intravenous, Q3H PRN, Judith Part, MD, 0.5 mg at 10/17/19 0107 .  hydroxypropyl methylcellulose / hypromellose (ISOPTO TEARS / GONIOVISC) 2.5 % ophthalmic solution 1 drop, 1 drop, Right Eye, Q2H, Luciana Cammarata, Joyice Faster, MD, 1 drop at 10/18/19 0858 .  labetalol (NORMODYNE) injection 10-40 mg, 10-40 mg, Intravenous, Q10 min PRN, Judith Part, MD, 20 mg at 10/17/19 2223 .  lisinopril (ZESTRIL) tablet 5 mg, 5 mg, Oral, Daily, Agarwala, Ravi, MD .  MEDLINE mouth rinse, 15 mL, Mouth Rinse, 10 times per day, Kipp Brood, MD, 15 mL at 10/18/19 0610 .  ondansetron (ZOFRAN) tablet 4 mg, 4 mg, Oral, Q4H PRN **OR** ondansetron (ZOFRAN) injection 4 mg, 4 mg, Intravenous, Q4H PRN, Tajai Suder A, MD .  polyethylene glycol (MIRALAX / GLYCOLAX) packet 17 g, 17 g, Per Tube, BID, Agarwala, Ravi, MD, 17 g at 10/16/19 1148 .  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 MRI with good debulking of tumor, pre-post op measurements show decrease in tumor volume from 19.7cc-->4.3cc. 4/22 progressive obtundation, CTH with post-op resection cavity hemorrhage not present on MRI, brainstem compression and developing obstructive hydrocephalus, taken back to OR for hematoma removal and hemostasis. Unfortunately, as expected, post-op cranial nerve dysfunction after hematoma evacuation despite normal intra-op stimulation. 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

## 2019-10-18 NOTE — Progress Notes (Addendum)
SLP Cancellation Note  Patient Details Name: Casey Richard MRN: [id number] DOB: Jan 02, 1956   Cancelled treatment:       Reason Eval/Treat Not Completed: Patient at procedure or test/unavailable. Note that PEG ended up being rescheduled for today and they are getting set up in the room for it at the moment. I introduced myself to the pt and told him our plan to try PMV. Will f/u as able for evaluation.   UPDATE: SLP returned this afternoon around 15:20 to reattempt PMV evaluation. Pt was currently receiving nursing care, but RN also shared that he had just desatted with need for bagging; not a good time for eval. Will continue to follow.     Osie Bond., M.A. [facility] Pager [phone] Office [phone]  10/18/2019, 11:18 AM

## 2019-10-18 NOTE — Procedures (Signed)
   Procedure Note  Date: 10/18/2019  Procedure: esophagogastroduodenoscopy (EGD) and percutaneous endoscopic gastrostomy (PEG) tube placement  Pre-op diagnosis: dysphagia Post-op diagnosis: same  Indication and clinical history: 52M with dysphagia after debulking brain surgery.   Surgeon: Jesusita Oka, MD Assistant: Donald Pore, [place]   Anesthesia: MAC, 53m versed, 5106mfentanyl  Findings: ulcers x2 hemostatic with overlying eschar, gastric polyp in antrum . Specimen: none . EBL: <5cc . Drains/Implants: PEG tube, 2.5cm at the skin   Disposition: ICU/PACU  Description of Procedure: The patient was positioned semi-recumbent. Time-out was performed verifying correct patient, procedure, signature of informed consent, and pre-operative antibiotics as indicated. MAC induction was uneventful and a bite block was placed into the oropharynx. The endoscope was inserted into the oropharynx and advanced down the esophagus into the stomach and into the duodenum. The visualized esophagus and duodenum were unremarkable. The endoscope was retracted back into the stomach and the stomach was insufflated. The stomach was inspected and was also normal. Transillumination was performed. The light was visible on the external skin and dimpling of the stomach was noted endoscopically with manual pressure. The abdomen was prepped and draped in the usual sterile fashion. Transillumination and dimpling were repeated and local anesthetic was infiltrated to make a skin wheal at the site of transillumination. The needle was inserted perpendicularly to the skin and the tip of the needle was visualized endoscopically. As the needle was retracted, the tract was also anesthetized. A skin nick was made at the site of the wheal and an introducer needle and sheath were inserted. The needle was removed and guidewire inserted. The guidewire was grasped by an endoscopic snare and the snare, guidewire, and endoscope retracted out of the  oropharynx. The PEG tube was secured to the guidewire and retracted through the mouth and esophagus into the stomach. The PEG tube was secured with a bolster and was visualized endoscopically to spin freely circumferentially and also be without gaps between the internal bumper and the stomach wall. There was no evidence of bleeding. The PEG bolster was secured at 2.5cm at the skin and there were no gaps between the bolster and the abdominal wall. The stomach was desufflated endoscopically and the endoscope removed. The bite block was also removed. The patient tolerated the procedure well and there were no complications.   The patient may have water and medications administered via the PEG tube beginning immediately and tube feeds may be initiated four hours post-procedure.    AyJesusita OkaMD General and [facility]

## 2019-10-18 NOTE — Progress Notes (Signed)
Inpatient Rehab Admissions Coordinator:   Spoke with pt's brother over the phone.  He lives in [facility] and has a daughter with significant medical issues, so not able to provide support for pt at discharge.  He shared pt's neighbor's information Lattie Haw) and I spoke with her as well; she is a full time student and works on the weekends so is not able to provide more than intermittent support as well.  Pt has 3 autistic sons (2 are over the age of 37, 30 of whom works nights), and 1 adult daughter who does not live with him.  His wife passed away recently 2/2 cancer.  I do not have contact information for his daughter.  At this time, it appears that pt may not have adequate support for an inpatient rehab admission.  Will discuss with medical director and f/u with patient on Monday.   Shann Medal, PT, DPT Admissions Coordinator [phone] 10/18/19  3:53 PM

## 2019-10-18 NOTE — Progress Notes (Signed)
NAME:  Casey Richard, MRN:  [id number], DOB:  [birth year], LOS: 17 ADMISSION DATE:  09/27/2019, CONSULTATION DATE:  10/13/2019 REFERRING MD:  Dr. Zada Finders, CHIEF COMPLAINT:  Acute Hypoxic Respiratory Distress   History of present illness   64 year old male presents to ED on 4/9 with gait instability, memory loss, and falls. CT Head with hypodense lesion with mass effect on pons, right cerebellum. Treated with steroids. 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 On 4/22 patient with lethargy. Taken for CT head which revealed post-operative hemorrhage with brainstem compression. Taken to OR for evacuation. 4/24 patient with acute hypoxic respiratory distress. CXR with bibasilar opacities with right greater then left, concerning for aspiration. PCCM consulted.   Past Medical History   Past Medical History:  Diagnosis Date  . Cardiac arrest ([place])   . CHB (complete heart block) ([place])   . 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 Dysrhythmia 1/13   bradycardia, VF, cardiac arrest/complete heart block with pacer inserted/LOV Dr Sallyanne Kuster 07/21/11 with interrogation and anesthesia guideline order on chart. Chest x ray 1/13 EPIC,, TEE and [facility]  1/13 EPIC  . Memory loss, short term    from fall with heart block 1/13  . Nonsustained ventricular tachycardia ([place]) 02/07/2014  . Presence of permanent cardiac pacemaker 07/15/2011   [employer]tr[employer] Revo  . Seasonal allergies   . Shortness of breath      Significant Hospital Events   4/9 > Admit 4/19> Resection  4/22 > Evacuation of hemorrhage  4/25 > PCCM Consulted   Significant Diagnostic Tests:  CT Head 4/09 > 1. No acute intracranial or calvarial findings. 2. 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 This most likely represents an arachnoid cyst or epidermoid, and could be symptomatic based on its  mass effect. Cystic neoplasm less likely. Recommend further evaluation with MRI of the brain without and with contrast (if possible; the patient has a pacemaker) and non emergent neuro surgical consultation. 3. No evidence of acute cervical spine fracture, traumatic subluxation or static signs of instability. 4. Mildly progressive cervical spondylosis MR Brain 4/12 > 1. 4.7 x 3.4 x 4.1 cm multi-cystic right cerebellopontine angle mass with associated abnormal enhancement extending into the right internal auditory canal. 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 Significant associated mass effect upon the right cerebellopontine and cerebellomedullary angle with partial effacement of the fourth ventricle and mild adjacent parenchymal edema. No hydrocephalus. 2. A 17 x 14 mm hypoenhancing mass within the left aspect of the sella has increased in size as compared MRI 02/15/2012. 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 Correlate with relevant laboratory values. 3. Mild chronic small vessel ischemic, progressed as compared to 02/15/2012. 4. Mild ethmoid sinus mucosal thickening. Small bilateral maxillary sinus mucous retention cysts. CT Head 4/22 > Right occipital craniectomy and cranioplasty related 2 debulking surgery for cystic mass of the right CP angle region. Gross measurements of the lesion today are 3.2 x 5.3 x 4.5 cm. Internal hemorrhage within the residual cystic lesion. Mass-effect upon the fourth ventricle with subsequent enlargement of the lateral and third ventricles consistent with mild hydrocephalus. Small amount of blood layering dependently in the occipital horns. Small amount of subdural blood along the inferior tentorium as well as dissecting inferiorly to and through the  foramen magnum to the C1 level with mild mass effect upon the brainstem. CXR 4//24 > 1. Bibasilar opacities,  right greater than left may represent aspiration or pneumonia. There may be associated effusion on the right. Recommend clinical correlation and short-term follow-up imaging to ensure resolution. 2. No other acute abnormalities.  Micro Data:  Sputum 4/25 >> Blood 4/25 >>   Antimicrobials:  Zosyn 4/25 >>  Interim history/subjective:  Tolerating trach collar trials.  For PEG tube today with surgery.  More agitated  Objective   Blood pressure (!) 152/78, pulse 99, temperature (!) 101.7 F (38.7 C), temperature source Oral, resp. rate 17, height 5\' 10"  (1.778 m), weight 69.5 kg, SpO2 96 %.    FiO2 (%):  [35 %] 35 %   Intake/Output Summary (Last 24 hours) at 10/18/2019 0933 Last data filed at 10/18/2019 0600 Gross per 24 hour  Intake 608.87 ml  Output 2000 ml  Net -1391.13 ml   Filed Weights   10/12/19 0500 10/13/19 0500 10/15/19 0500  Weight: 67.9 kg 67.9 kg 69.5 kg    Examination: General: Adult male, more agitated, on trach collar. HENT:  SBFT with no skin breakdown. Tracheostomy in place with no bleeding.  Lungs: chest clear bilaterally. Cardiovascular: Paced - HS normal Abdomen: Soft, non-tender, active bowel sounds  Extremities: no edema, superficial skin excoriation on left back of thigh. Neuro: awake and following command, able to write. Right ptosis. Inattention, attempting to get out of bed. GU: intact, Foley catheter.   Assessment & Plan:   Critically ill due to Acute Hypoxic Respiratory Failure presumed secondary to aspiration PNA - failed HFNC due to inability to control secretions.  Location of tumor/bleed likely affecting pharyngeal function.  - Open ended trach collar trial. - Progressive ambulation.  CPA Mass s/p Resection, with hemorrhage s/p evacuation  - Enteral BP control.  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  H/O Heart Block, V.Fib Arrest 2013 s/p Pacemaker Plan -Cardiac Monitoring   Agitated  delirium -Start Seroquel at bedtime.  Daily Goals Checklist  Pain/Anxiety/Delirium protocol (if indicated): prn analgesia only  VAP protocol (if indicated): bundle in place. Respiratory support goals: Trach collar Blood pressure target: Oral treatment to keep SBP<160. DVT prophylaxis: Heparin tid to start today. Nutrition Status: Nutrition Problem: Severe Malnutrition Etiology: chronic illness Signs/Symptoms: severe muscle depletion, severe fat depletion, percent weight loss, energy intake < or equal to 75% for > or equal to 1 month Percent weight loss: 22 % Interventions: Ensure Enlive (each supplement provides 350kcal and 20 grams of protein), Magic cup, MVI, Liberalize Diet GI prophylaxis:  Famotidine Fluid status goals: negative fluid balance. Allow autoregulation. Urinary catheter: Condom  Central line: PIV onely Glucose control: euglycemic on no therapy.  Mobility/therapy needs: PT/OT/SLP, up to chair Antibiotic de-escalation: Zosyn for 5 days for aspiration pneumonia. Home medication reconciliation: no relevant medications.  Daily labs: CBC, BNP Code Status: Full  Family Communication:will update brother Disposition: ICU - to PCU if remains on TC 48h.    Labs   CBC: Recent Labs  Lab 10/13/19 0953 10/13/19 2352 10/14/19 0431 10/14/19 0431 10/14/19 0626 10/14/19 0930 10/15/19 0635 10/16/19 0421 10/17/19 0254  WBC 19.2*  --  18.4*  --   --   --  11.8* 10.2 13.6*  HGB 12.0*   < > 11.4*   < > 10.5* 10.5* 9.8* 10.3* 11.3*  HCT 36.2*   < > 34.5*   < > 31.0* 31.0* 30.1* 31.6* 34.8*  MCV 100.6*  --  98.9  --   --   --  101.7* 101.3* 98.9  PLT 161  --  187  --   --   --  164 164 186   < > = values in this interval not displayed.    Basic Metabolic Panel: Recent Labs  Lab 10/13/19 0531 10/13/19 0900 10/13/19 0953 10/13/19 1700 10/13/19 2352 10/14/19 0431 10/14/19 0431 10/14/19 0626 10/14/19 0930 10/15/19 0635 10/16/19 0421 10/17/19 0254  NA  --    < > 144   --    < > 145   < > 147* 147* 147* 147* 147*  K  --    < > 4.1  --    < > 3.7   < > 3.7 3.6 3.9 3.9 4.2  CL  --   --  112*  --   --  114*  --   --   --  120* 116* 113*  CO2  --   --  23  --   --  22  --   --   --  23 19* 24  GLUCOSE  --   --  119*  --   --  126*  --   --   --  105* 138* 108*  BUN  --   --  35*  --   --  37*  --   --   --  34* 32* 27*  CREATININE  --   --  0.56*  --   --  0.79  --   --   --  0.66 0.68 0.59*  CALCIUM  --   --  8.3*  --   --  8.1*  --   --   --  7.7* 7.8* 8.1*  MG 2.1  --   --  2.0  --   --   --   --   --  2.0 2.0 2.1  PHOS 1.8*  --   --  3.2  --   --   --   --   --  3.0 2.9 3.2   < > = values in this interval not displayed.   GFR: Estimated Creatinine Clearance: 90.5 mL/min (A) (by C-G formula based on SCr of 0.59 mg/dL (L)). Recent Labs  Lab 10/13/19 0953 10/13/19 0953 10/14/19 0431 10/15/19 0635 10/16/19 0421 10/17/19 0254  PROCALCITON 0.30  --   --   --   --   --   WBC 19.2*   < > 18.4* 11.8* 10.2 13.6*  LATICACIDVEN 0.6  --   --   --   --   --    < > = values in this interval not displayed.    Liver Function Tests: Recent Labs  Lab 10/13/19 0953  AST 25  ALT 48*  ALKPHOS 63  BILITOT 1.2  PROT 5.4*  ALBUMIN 2.5*   No results for input(s): LIPASE, AMYLASE in the last 168 hours. No results for input(s): AMMONIA in the last 168 hours.  ABG    Component Value Date/Time   PHART 7.424 10/14/2019 0930   PCO2ART 35.5 10/14/2019 0930   PO2ART 102 10/14/2019 0930   HCO3 23.0 10/14/2019 0930   TCO2 24 10/14/2019 0930   ACIDBASEDEF 1.0 10/14/2019 0930   O2SAT 98.0 10/14/2019 0930     Coagulation Profile: No results for input(s): INR, PROTIME in the last 168 hours.  Cardiac Enzymes: No results for input(s): CKTOTAL, CKMB, CKMBINDEX, TROPONINI in the last 168  hours.  HbA1C: No results found for: HGBA1C  CBG: Recent Labs  Lab 10/17/19 1510 10/17/19 1919 10/17/19 2307 10/18/19 0319 10/18/19 0726  GLUCAP 109* 109* Alba, MD [facility] ICU Physician [place]  Pager: [phone] Mobile: [phone] After hours: [phone].

## 2019-10-18 NOTE — Progress Notes (Signed)
Physical Therapy Treatment Patient Details Name: Casey Richard MRN: [id number] DOB: 02-Jan-1956 Today's Date: 10/18/2019    History of Present Illness 64 y/o male admitted secondary to dizziness, weight loss, and gait instability. 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  MRI confirmed 2 large brain masses. S/p right crani for tumor excision on 10/07/19. PMH includes CKD and complete heart block s/p pacemaker. Neuro status changes s/p evacuation of post operative hematoma on 10/10/19. Patient then with acute hypoxic respiratory distress. CXR with bibasilar opacities with right greater then left, concerning for aspiration, re-intubated 4/26, s/p tracheostomy 4/27.  Trach collar on 4/29.  PEG tube 10/18/19.    PT Comments    Co session with OT with focus on sitting balance, mobility and attempts at standing and side stepping without standing frame.  We were limited on time due to pt being set up for PEG tube placement.  PT will continue to follow acutely for safe mobility progression.  Try RW and stepping away from bed next session with +2 assist.    Follow Up Recommendations  CIR     Equipment Recommendations  Rolling walker with 5" wheels;3in1 (PT);Hospital bed;Wheelchair (measurements PT);Wheelchair cushion (measurements PT)    Recommendations for Other Services   NA     Precautions / Restrictions Precautions Precautions: Fall Precaution Comments: leans to the right and posteriorly Restrictions Weight Bearing Restrictions: No    Mobility  Bed Mobility Overal bed mobility: Needs Assistance Bed Mobility: Supine to Sit;Sit to Sidelying;Rolling Rolling: Min guard   Supine to sit: Mod assist;HOB elevated   Sit to sidelying: Mod assist;+2 for physical assistance;+2 for safety/equipment General bed mobility comments: Mod A to elevate trunk into upright posture. Mod A for managing BLEs back into  bed.  Transfers Overall transfer level: Needs assistance Equipment used: 2 person hand held assist Transfers: Sit to/from Stand Sit to Stand: Mod assist;+2 physical assistance;+2 safety/equipment         General transfer comment: Mod A +2 for power up into standing and then correct R lateral lean.   Two person Mod assist for 2-3 side steps up in bed.       Balance Overall balance assessment: Needs assistance Sitting-balance support: Feet supported;Bilateral upper extremity supported Sitting balance-Leahy Scale: Poor Sitting balance - Comments: Flucuating between Min A-max A for right lateral lean. Placing patch over R eye and pt demonstrating increased balnace but still flucuating between Min-Max A. Postural control: Posterior lean;Right lateral lean Standing balance support: Bilateral upper extremity supported;During functional activity Standing balance-Leahy Scale: Poor Standing balance comment: Reliant on physical A for correcting Right lean                            Cognition Arousal/Alertness: Awake/alert Behavior During Therapy: Impulsive Overall Cognitive Status: Difficult to assess Area of Impairment: Attention;Memory;Following commands;Safety/judgement;Awareness;Problem solving                   Current Attention Level: Focused Memory: Decreased short-term memory Following Commands: Follows one step commands inconsistently;Follows one step commands with increased time Safety/Judgement: Decreased awareness of safety;Decreased awareness of deficits Awareness: Intellectual Problem Solving: Difficulty sequencing;Requires verbal cues;Requires tactile cues;Decreased initiation;Slow processing General Comments: Pt following simple cues with increased time and mutiple cues. Pt with poor awareness and falling more to right this session. Difficulty correcting posture.          General Comments  General comments (skin integrity, edema, etc.): VSS on 35%  Trach collar 8L.  O2 tank attachment in room now.       Pertinent Vitals/Pain Pain Assessment: Faces Faces Pain Scale: Hurts little more Pain Location: head Pain Descriptors / Indicators: Grimacing Pain Intervention(s): Limited activity within patient's tolerance;Monitored during session;Repositioned       Prior Function            PT Goals (current goals can now be found in the care plan section) Acute Rehab PT Goals Patient Stated Goal: to eat again and walk Progress towards PT goals: Progressing toward goals    Frequency    Min 3X/week      PT Plan Current plan remains appropriate    Co-evaluation PT/OT/SLP Co-Evaluation/Treatment: Yes Reason for Co-Treatment: Complexity of the patient's impairments (multi-system involvement);Necessary to address cognition/behavior during functional activity;For patient/therapist safety;To address functional/ADL transfers PT goals addressed during session: Mobility/safety with mobility;Balance OT goals addressed during session: ADL's and self-care      AM-PAC PT "6 Clicks" Mobility   Outcome Measure  Help needed turning from your back to your side while in a flat bed without using bedrails?: A Little Help needed moving from lying on your back to sitting on the side of a flat bed without using bedrails?: A Little Help needed moving to and from a bed to a chair (including a wheelchair)?: A Lot Help needed standing up from a chair using your arms (e.g., wheelchair or bedside chair)?: A Lot Help needed to walk in hospital room?: A Lot Help needed climbing 3-5 steps with a railing? : Total 6 Click Score: 13    End of Session Equipment Utilized During Treatment: Oxygen Activity Tolerance: Patient tolerated treatment well Patient left: in bed;with call bell/phone within reach;Other (comment)(with tech coming in to set up for PEG)   PT Visit Diagnosis: Muscle weakness (generalized) (M62.81);Difficulty in walking, not elsewhere  classified (R26.2);Dizziness and giddiness (R42);Pain     Time: [id number] PT Time Calculation (min) (ACUTE ONLY): 25 min  Charges:  $Therapeutic Activity: 8-22 mins             Verdene Lennert, PT, DPT  Acute Rehabilitation [phone] pager #[phone] office              10/18/2019, 1:56 PM

## 2019-10-18 NOTE — Progress Notes (Signed)
Occupational Therapy Treatment Patient Details Name: Casey Richard MRN: [id number] DOB: 1956/03/14 Today's Date: 10/18/2019    History of present illness 64 y/o male admitted secondary to dizziness, weight loss, and gait instability. 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  MRI confirmed 2 large brain masses. S/p right crani for tumor excision on 10/07/19. PMH includes CKD and complete heart block s/p pacemaker. Neuro status changes s/p evacuation of post operative hematoma on 10/10/19. Patient then with acute hypoxic respiratory distress. CXR with bibasilar opacities with right greater then left, concerning for aspiration, re-intubated 4/26, s/p tracheostomy 4/27.  Trach collar on 4/29.     OT comments  Pt progressing towards established OT goals. Pt presenting with motivation to participate in therapy. Pt requiring increased time and cues throughout. Pt demonstrating decreased sitting balance this session with requiring Min-Max A for correction of right lateral lean. While managing yonker, pt requiring Max A for sitting balance. Pt requiring Mod A +2 for sit<>stand and to take side steps towards HOB. Continue to recommend dc to CIR and will continue to follow acutely as admitted.   Follow Up Recommendations  CIR    Equipment Recommendations  Other (comment)(TBD)    Recommendations for Other Services      Precautions / Restrictions Precautions Precautions: Fall Precaution Comments: leans to the right  Restrictions Weight Bearing Restrictions: No       Mobility Bed Mobility Overal bed mobility: Needs Assistance Bed Mobility: Supine to Sit;Sit to Sidelying;Rolling Rolling: Min guard   Supine to sit: Mod assist;HOB elevated   Sit to sidelying: Mod assist;+2 for physical assistance;+2 for safety/equipment General bed mobility comments: Mod A to elevate trunk into upright posture. Mod A for managing BLEs back  into bed.  Transfers Overall transfer level: Needs assistance Equipment used: 2 person hand held assist Transfers: Sit to/from Stand Sit to Stand: Mod assist;+2 physical assistance;+2 safety/equipment         General transfer comment: Mod A +2 for power up into standing and then correct R lateral lean.     Balance Overall balance assessment: Needs assistance Sitting-balance support: Feet supported;Bilateral upper extremity supported Sitting balance-Leahy Scale: Poor Sitting balance - Comments: Flucuating between Min A-max A for right lateral lean. Placing patch over R eye and pt demonstrating increased balnace but still flucuating between Min-Max A. Postural control: Posterior lean;Right lateral lean Standing balance support: Bilateral upper extremity supported;During functional activity Standing balance-Leahy Scale: Poor Standing balance comment: Reliant on physical A for correcting Right lean                           ADL either performed or assessed with clinical judgement   ADL Overall ADL's : Needs assistance/impaired     Grooming: Maximal assistance;Sitting Grooming Details (indicate cue type and reason): Pt requiring Max A for sitting balance while using yonker for suction. Pt able to hold and manage yonker.                 Toilet Transfer: Moderate assistance;+2 for physical assistance;+2 for safety/equipment;Maximal assistance(side steps towards [facility]) Toilet Transfer Details (indicate cue type and reason): Mod-Max A for dynamic balance pending his attention to task. heavy R lean         Functional mobility during ADLs: Moderate assistance;Maximal assistance(heavy left lean; Two person hand held) General ADL Comments: Pt performing side steps towards HOB. Decreased balance and cognition  Vision   Additional Comments: Continues to present with diplopia and nodding yes to improvements when patching R eye.    Perception     Praxis       Cognition Arousal/Alertness: Awake/alert Behavior During Therapy: Impulsive Overall Cognitive Status: Difficult to assess Area of Impairment: Attention;Memory;Following commands;Safety/judgement;Awareness;Problem solving                   Current Attention Level: Focused Memory: Decreased short-term memory Following Commands: Follows one step commands inconsistently;Follows one step commands with increased time Safety/Judgement: Decreased awareness of safety;Decreased awareness of deficits Awareness: Intellectual Problem Solving: Difficulty sequencing;Requires verbal cues;Requires tactile cues;Decreased initiation;Slow processing General Comments: Pt following simple cues with increased time and mutiple cues. Pt with poor awareness and falling more to right this session. Difficulty correcting posture.         Exercises     Shoulder Instructions       General Comments VSS on trach collar    Pertinent Vitals/ Pain       Pain Assessment: Faces Faces Pain Scale: Hurts little more Pain Location: head Pain Descriptors / Indicators: Grimacing Pain Intervention(s): Monitored during session;Limited activity within patient's tolerance;Repositioned  Home Living                                          Prior Functioning/Environment              Frequency  Min 3X/week        Progress Toward Goals  OT Goals(current goals can now be found in the care plan section)  Progress towards OT goals: Progressing toward goals  Acute Rehab OT Goals Patient Stated Goal: to eat again and walk Time For Goal Achievement: 10/30/19 Potential to Achieve Goals: Good ADL Goals Pt Will Perform Grooming: with min assist;standing Pt Will Perform Lower Body Bathing: sit to/from stand;with min guard assist Pt Will Perform Lower Body Dressing: with min guard assist;sit to/from stand Pt Will Transfer to Toilet: with min assist;ambulating;bedside commode Pt Will Perform  Toileting - Clothing Manipulation and hygiene: with min assist;sitting/lateral leans;sit to/from stand Additional ADL Goal #1: Pt will demonstrate selective attention during ADLs with Min cues  Plan Discharge plan remains appropriate    Co-evaluation    PT/OT/SLP Co-Evaluation/Treatment: Yes Reason for Co-Treatment: Complexity of the patient's impairments (multi-system involvement);For patient/therapist safety;To address functional/ADL transfers   OT goals addressed during session: ADL's and self-care      AM-PAC OT "6 Clicks" Daily Activity     Outcome Measure   Help from another person eating meals?: Total Help from another person taking care of personal grooming?: A Lot Help from another person toileting, which includes using toliet, bedpan, or urinal?: Total Help from another person bathing (including washing, rinsing, drying)?: Total Help from another person to put on and taking off regular upper body clothing?: Total Help from another person to put on and taking off regular lower body clothing?: Total 6 Click Score: 7    End of Session Equipment Utilized During Treatment: Oxygen  OT Visit Diagnosis: Unsteadiness on feet (R26.81);Repeated falls (R29.6);Muscle weakness (generalized) (M62.81)   Activity Tolerance Patient tolerated treatment well   Patient Left in bed;with call bell/phone within reach;Other (comment)(Endo team for peg placement)   Nurse Communication Mobility status        Time: [id number] OT Time Calculation (min): 24 min  Charges: OT General Charges $  OT Visit: 1 Visit OT Treatments $Therapeutic Activity: 8-22 mins  [place], OTR/L Acute Rehab Pager: [phone] Office: [place] 10/18/2019, 11:59 AM

## 2019-10-19 DIAGNOSIS — G9389 Other specified disorders of brain: Secondary | ICD-10-CM | POA: Diagnosis not present

## 2019-10-19 DIAGNOSIS — Z930 Tracheostomy status: Secondary | ICD-10-CM

## 2019-10-19 DIAGNOSIS — R0902 Hypoxemia: Secondary | ICD-10-CM

## 2019-10-19 LAB — CBC
HCT: 35.2 % — ABNORMAL LOW (ref 39.0–52.0)
Hemoglobin: 11.4 g/dL — ABNORMAL LOW (ref 13.0–17.0)
MCH: 32.4 pg (ref 26.0–34.0)
MCHC: 32.4 g/dL (ref 30.0–36.0)
MCV: 100 fL (ref 80.0–100.0)
Platelets: 163 10*3/uL (ref 150–400)
RBC: 3.52 MIL/uL — ABNORMAL LOW (ref 4.22–5.81)
RDW: 13.5 % (ref 11.5–15.5)
WBC: 13 10*3/uL — ABNORMAL HIGH (ref 4.0–10.5)
nRBC: 0 % (ref 0.0–0.2)

## 2019-10-19 LAB — EXPECTORATED SPUTUM ASSESSMENT W GRAM STAIN, RFLX TO RESP C

## 2019-10-19 LAB — COMPREHENSIVE METABOLIC PANEL
ALT: 64 U/L — ABNORMAL HIGH (ref 0–44)
AST: 36 U/L (ref 15–41)
Albumin: 2.4 g/dL — ABNORMAL LOW (ref 3.5–5.0)
Alkaline Phosphatase: 69 U/L (ref 38–126)
Anion gap: 11 (ref 5–15)
BUN: 34 mg/dL — ABNORMAL HIGH (ref 8–23)
CO2: 24 mmol/L (ref 22–32)
Calcium: 8.3 mg/dL — ABNORMAL LOW (ref 8.9–10.3)
Chloride: 116 mmol/L — ABNORMAL HIGH (ref 98–111)
Creatinine, Ser: 0.92 mg/dL (ref 0.61–1.24)
GFR calc Af Amer: >60 mL/min (ref >60)
GFR calc non Af Amer: >60 mL/min (ref >60)
Glucose, Bld: 145 mg/dL — ABNORMAL HIGH (ref 70–99)
Potassium: 3.7 mmol/L (ref 3.5–5.1)
Sodium: 151 mmol/L — ABNORMAL HIGH (ref 135–145)
Total Bilirubin: 0.9 mg/dL (ref 0.3–1.2)
Total Protein: 5.8 g/dL — ABNORMAL LOW (ref 6.5–8.1)

## 2019-10-19 LAB — GLUCOSE, CAPILLARY
Glucose-Capillary: 119 mg/dL — ABNORMAL HIGH (ref 70–99)
Glucose-Capillary: 120 mg/dL — ABNORMAL HIGH (ref 70–99)
Glucose-Capillary: 125 mg/dL — ABNORMAL HIGH (ref 70–99)
Glucose-Capillary: 136 mg/dL — ABNORMAL HIGH (ref 70–99)
Glucose-Capillary: 159 mg/dL — ABNORMAL HIGH (ref 70–99)
Glucose-Capillary: 96 mg/dL (ref 70–99)

## 2019-10-19 MED ORDER — IPRATROPIUM-ALBUTEROL 0.5-2.5 (3) MG/3ML IN SOLN
3.0000 mL | Freq: Two times a day (BID) | RESPIRATORY_TRACT | Status: DC
  Administered 2019-10-19 – 2019-10-26 (×16): 3 mL via RESPIRATORY_TRACT
  Filled 2019-10-19 (×16): qty 3

## 2019-10-19 MED ORDER — FREE WATER
200.0000 mL | Freq: Four times a day (QID) | Status: DC
  Administered 2019-10-19 – 2019-10-20 (×5): 200 mL

## 2019-10-19 MED ORDER — PANTOPRAZOLE SODIUM 40 MG PO PACK
40.0000 mg | PACK | Freq: Every day | ORAL | Status: DC
  Administered 2019-10-19 – 2019-11-15 (×28): 40 mg
  Filled 2019-10-19 (×28): qty 20

## 2019-10-19 MED ORDER — ORAL CARE MOUTH RINSE
15.0000 mL | Freq: Two times a day (BID) | OROMUCOSAL | Status: DC
  Administered 2019-10-19 – 2019-11-15 (×45): 15 mL via OROMUCOSAL

## 2019-10-19 MED ORDER — LISINOPRIL 5 MG PO TABS
5.0000 mg | ORAL_TABLET | Freq: Every day | ORAL | Status: DC
  Administered 2019-10-19 – 2019-11-14 (×21): 5 mg
  Filled 2019-10-19 (×26): qty 1

## 2019-10-19 MED ORDER — CHLORHEXIDINE GLUCONATE 0.12 % MT SOLN
15.0000 mL | Freq: Two times a day (BID) | OROMUCOSAL | Status: DC
  Administered 2019-10-19 – 2019-11-15 (×53): 15 mL via OROMUCOSAL
  Filled 2019-10-19 (×49): qty 15

## 2019-10-19 MED ORDER — DOCUSATE SODIUM 50 MG/5ML PO LIQD
100.0000 mg | Freq: Every day | ORAL | Status: DC
  Administered 2019-10-19 – 2019-11-15 (×25): 100 mg
  Filled 2019-10-19 (×28): qty 10

## 2019-10-19 NOTE — Plan of Care (Signed)
  Problem: Clinical Measurements: Goal: Ability to maintain clinical measurements within normal limits will improve Outcome: Progressing   

## 2019-10-19 NOTE — Progress Notes (Addendum)
Neurosurgery Service Progress Note  Subjective: PEG yesterday w/o event, still doing well on trach collar, asking to brush his teeth today via gesticulation, R eye itching and sensitive to light  Objective: Vitals:   10/19/19 0600 10/19/19 0740 10/19/19 0800 10/19/19 0900  BP:  (!) 138/95 (!) 149/94 (!) 155/91  Pulse: (!) 108 91 93 93  Resp: 17 17 (!) 24 18  Temp:   (!) 101.2 F (38.4 C)   TempSrc:   Axillary   SpO2:  94% 96% 98%  Weight:      Height:       Temp (24hrs), Avg:99.5 F (37.5 C), Min:98 F (36.7 C), Max:103.1 F (39.5 C)  CBC Latest Ref Rng & Units 10/19/2019 10/17/2019 10/16/2019  WBC 4.0 - 10.5 K/uL 13.0(H) 13.6(H) 10.2  Hemoglobin 13.0 - 17.0 g/dL 11.4(L) 11.3(L) 10.3(L)  Hematocrit 39.0 - 52.0 % 35.2(L) 34.8(L) 31.6(L)  Platelets 150 - 400 K/uL 163 186 164   BMP Latest Ref Rng & Units 10/19/2019 10/17/2019 10/16/2019  Glucose 70 - 99 mg/dL 145(H) 108(H) 138(H)  BUN 8 - 23 mg/dL 34(H) 27(H) 32(H)  Creatinine 0.61 - 1.24 mg/dL 0.92 0.59(L) 0.68  Sodium 135 - 145 mmol/L 151(H) 147(H) 147(H)  Potassium 3.5 - 5.1 mmol/L 3.7 4.2 3.9  Chloride 98 - 111 mmol/L 116(H) 113(H) 116(H)  CO2 22 - 32 mmol/L 24 24 19(L)  Calcium 8.9 - 10.3 mg/dL 8.3(L) 8.1(L) 7.8(L)    Intake/Output Summary (Last 24 hours) at 10/19/2019 0936 Last data filed at 10/19/2019 0852 Gross per 24 hour  Intake 1093.33 ml  Output 2125 ml  Net -1031.67 ml    Current Facility-Administered Medications:  .  0.9 %  sodium chloride infusion, , Intravenous, PRN, Judith Part, MD, Last Rate: 10 mL/hr at 10/19/19 0600, Rate Verify at 10/19/19 0600 .  acetaminophen (TYLENOL) 160 MG/5ML solution 650 mg, 650 mg, Per Tube, Q4H PRN, Ashok Pall, MD, 650 mg at 10/19/19 0802 .  [DISCONTINUED] acetaminophen (TYLENOL) tablet 650 mg, 650 mg, Oral, Q4H PRN **OR** acetaminophen (TYLENOL) suppository 650 mg, 650 mg, Rectal, Q4H PRN, Judith Part, MD .  amLODipine (NORVASC) tablet 10 mg, 10 mg, Per Tube,  Daily, Kipp Brood, MD, Stopped at 10/18/19 1000 .  bethanechol (URECHOLINE) tablet 10 mg, 10 mg, Per Tube, TID, Kipp Brood, MD, 10 mg at 10/18/19 2208 .  chlorhexidine gluconate (MEDLINE KIT) (PERIDEX) 0.12 % solution 15 mL, 15 mL, Mouth Rinse, BID, Agarwala, Ravi, MD, 15 mL at 10/18/19 2010 .  Chlorhexidine Gluconate Cloth 2 % PADS 6 each, 6 each, Topical, Daily, Judith Part, MD, 6 each at 10/17/19 0900 .  docusate (COLACE) 50 MG/5ML liquid 100 mg, 100 mg, Per Tube, Daily, Merlene Laughter F, NP .  Derrill Memo ON 10/22/2019] erythromycin ophthalmic ointment, , Right Eye, BID, Jalene Mullet, MD .  feeding supplement (OSMOLITE 1.2 CAL) liquid 1,000 mL, 1,000 mL, Per Tube, Continuous, Etai Copado, Joyice Faster, MD, Last Rate: 70 mL/hr at 10/19/19 0600, Rate Verify at 10/19/19 0600 .  feeding supplement (PRO-STAT SUGAR FREE 64) liquid 30 mL, 30 mL, Per Tube, Daily, Shaelynn Dragos, Joyice Faster, MD, 30 mL at 10/17/19 1158 .  free water 200 mL, 200 mL, Per Tube, Q6H, Merlene Laughter F, NP .  heparin injection 5,000 Units, 5,000 Units, Subcutaneous, Q8H, Kipp Brood, MD, 5,000 Units at 10/19/19 0516 .  HYDROcodone-acetaminophen (NORCO/VICODIN) 5-325 MG per tablet 1 tablet, 1 tablet, Per Tube, Q4H PRN, Ashok Pall, MD, 1 tablet at 10/19/19 0802 .  HYDROmorphone (DILAUDID) injection 0.5 mg, 0.5 mg, Intravenous, Q3H PRN, Judith Part, MD, 0.5 mg at 10/19/19 0931 .  labetalol (NORMODYNE) injection 10-40 mg, 10-40 mg, Intravenous, Q10 min PRN, Judith Part, MD, 20 mg at 10/18/19 1404 .  lisinopril (ZESTRIL) tablet 5 mg, 5 mg, Oral, Daily, Agarwala, Ravi, MD .  MEDLINE mouth rinse, 15 mL, Mouth Rinse, 10 times per day, Kipp Brood, MD, 15 mL at 10/19/19 0621 .  neomycin-polymyxin b-dexamethasone (MAXITROL) ophthalmic ointment, , Right Eye, TID, Jalene Mullet, MD, Given at 10/18/19 2208 .  ondansetron (ZOFRAN) tablet 4 mg, 4 mg, Oral, Q4H PRN **OR** ondansetron (ZOFRAN) injection 4 mg, 4 mg,  Intravenous, Q4H PRN, Bernese Doffing A, MD .  pantoprazole (PROTONIX) EC tablet 40 mg, 40 mg, Oral, Daily, Ysmael Hires A, MD .  promethazine (PHENERGAN) tablet 12.5-25 mg, 12.5-25 mg, Per Tube, Q4H PRN, Judith Part, MD .  QUEtiapine (SEROQUEL) tablet 50 mg, 50 mg, Per Tube, QHS, Judith Part, MD, 50 mg at 10/18/19 2213   Physical Exam: On TC, FCx4 and interactive, eyes open, +R injection / corneal edema, stable R cranial nerve palsies  Assessment & Plan: 64 y.o. 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 MRI with good debulking of tumor, pre-post op measurements show decrease in tumor volume from 19.7cc-->4.3cc. 4/22 progressive obtundation, CTH with post-op resection cavity hemorrhage not present on MRI, brainstem compression and developing obstructive hydrocephalus, taken back to OR for hematoma removal and hemostasis. Unfortunately, as expected, post-op cranial nerve dysfunction after hematoma evacuation despite normal intra-op stimulation. Post-op CTH with improved mass effect on brainstem, slowly improving ventricular size with some IVH, 4/26 CTH stable ventricular size, hemorrhage resolving, 4/26 intubated for aspiration / PNA, 4/27 trach, 4/30 s/p PEG  -transfer to stepdown -retained >1L this morning s/p cath, if he retains again then replace foley -ophtho recs for R eye, looks like he was switched to erythromycin and maxitrol ointment, unclear duration of Tx -panCx for fevers -final path cystic schwannoma -PT/OT rec CIR, f/u PM&R recs -SCDs/TEDs, Jackey Loge  10/19/19 9:36 AM

## 2019-10-19 NOTE — Progress Notes (Signed)
Patient seen and examined. PEG in stable position at 2.5cm at the skin. Continue abdominal binder and okay to use PEG for tube feeds, water flushes, and meds. Call surgery immediately if PEG is dislodged prior to 5/7 as this is a surgical emergency.   Jesusita Oka, MD General and [facility]

## 2019-10-19 NOTE — Progress Notes (Signed)
NAME:  Casey Richard, MRN:  [id number], DOB:  February 11, 1956, LOS: 30 ADMISSION DATE:  09/27/2019, CONSULTATION DATE:  10/13/2019 REFERRING MD:  Dr. Zada Finders, CHIEF COMPLAINT:  Acute Hypoxic Respiratory Distress   History of present illness   64 year old male presents to ED on 4/9 with gait instability, memory loss, and falls. CT Head with hypodense lesion with mass effect on pons, right cerebellum. Treated with steroids. 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 On 4/22 patient with lethargy. Taken for CT head which revealed post-operative hemorrhage with brainstem compression. Taken to OR for evacuation. 4/24 patient with acute hypoxic respiratory distress. CXR with bibasilar opacities with right greater then left, concerning for aspiration. PCCM consulted.   Past Medical History   Past Medical History:  Diagnosis Date  . Cardiac arrest ([place])   . CHB (complete heart block) ([place])   . 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 Dysrhythmia 1/13   bradycardia, VF, cardiac arrest/complete heart block with pacer inserted/LOV Dr Sallyanne Kuster 07/21/11 with interrogation and anesthesia guideline order on chart. Chest x ray 1/13 EPIC,, TEE and [facility]  1/13 EPIC  . Memory loss, short term    from fall with heart block 1/13  . Nonsustained ventricular tachycardia ([place]) 02/07/2014  . Presence of permanent cardiac pacemaker 07/15/2011   Medtronic Revo  . Seasonal allergies   . Shortness of breath      Significant Hospital Events   4/9 > Admit 4/19 > Resection  4/22 > Evacuation of hemorrhage  4/25 > PCCM Consulted  4/27 > Trached   Significant Diagnostic Tests:  CT Head 4/09 >  1. No acute intracranial or calvarial findings. 2. 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 This most likely represents an arachnoid cyst or epidermoid, and could be  symptomatic based on its mass effect. Cystic neoplasm less likely. Recommend further evaluation with MRI of the brain without and with contrast (if possible; the patient has a pacemaker) and non emergent neuro surgical consultation. 3. No evidence of acute cervical spine fracture, traumatic subluxation or static signs of instability. 4. Mildly progressive cervical spondylosis   MR Brain 4/12 >  1. 4.7 x 3.4 x 4.1 cm multi-cystic right cerebellopontine angle mass with associated abnormal enhancement extending into the right internal auditory canal. 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 Significant associated mass effect upon the right cerebellopontine and cerebellomedullary angle with partial effacement of the fourth ventricle and mild adjacent parenchymal edema. No hydrocephalus. 2. A 17 x 14 mm hypoenhancing mass within the left aspect of the sella has increased in size as compared MRI 02/15/2012. 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 Correlate with relevant laboratory values. 3. Mild chronic small vessel ischemic, progressed as compared to 02/15/2012. 4. Mild ethmoid sinus mucosal thickening. Small bilateral maxillary sinus mucous retention cysts.  CT Head 4/22 >  Right occipital craniectomy and cranioplasty related 2 debulking surgery for cystic mass of the right CP angle region. Gross measurements of the lesion today are 3.2 x 5.3 x 4.5 cm. Internal hemorrhage within the residual cystic lesion. Mass-effect upon the fourth ventricle with subsequent enlargement of the lateral and third ventricles consistent with mild hydrocephalus. Small amount of blood layering dependently in the occipital horns. Small amount of subdural blood along the  inferior tentorium as well as dissecting inferiorly to and through the foramen magnum to the C1 level with mild mass effect upon the brainstem.    CXR 4//24 > 1. Bibasilar opacities, right greater than left may represent aspiration or pneumonia. There may be associated effusion on the right. Recommend clinical correlation and short-term follow-up imaging to ensure resolution. 2. No other acute abnormalities.  Head CT 4/26 >: 1. Interval improvement in hemorrhage in the tumor bed right cerebellar pontine angle . 2. Mild ventricular hemorrhage and mild hydrocephalus unchanged. Subarachnoid hemorrhage bilaterally appears unchanged on the right but improved on the left. No new hemorrhage.  Micro Data:  Sputum 4/25 > negative  Blood 4/25 > negative  Urine culture 4/9 > Staph Epidermis > sensitive to cipro or bactrium,   Antimicrobials:  Zosyn 4/25 >> 4/30  Interim history/subjective:  Sitting up in bed on ATC, denies any acute complaints.   Objective   Blood pressure (!) 149/98, pulse (!) 108, temperature 98 F (36.7 C), temperature source Oral, resp. rate 17, height 5\' 10"  (1.778 m), weight 63.8 kg, SpO2 96 %.    FiO2 (%):  [28 %-35 %] 28 %   Intake/Output Summary (Last 24 hours) at 10/19/2019 0728 Last data filed at 10/19/2019 0600 Gross per 24 hour  Intake 1093.33 ml  Output 601 ml  Net 492.33 ml   Filed Weights   10/13/19 0500 10/15/19 0500 10/19/19 0500  Weight: 67.9 kg 69.5 kg 63.8 kg    Examination: General: Chronically ill appearing thin elderly male on ATC, in NAD HEENT: [place]/AT, Trach midline, #6 Shiley cuffed,  MM pink/moist, PERRL, right eye with mild ptosis and redness Neuro: Alert and oriented, able to communicate needs, abel to move all extremities, non-focal  CV: s1s2 regular rate and rhythm, no murmur, rubs, or gallops,  PULM:  Clear to ascultation bilaterally, no increased work of breathing, tolerating ATC well GI: soft, bowel sounds active in all 4 quadrants, non-tender, non-distended, tolerating TF Extremities/skin: warm/dry, area of dried blisters to lateral left thigh see image below, no edema      Assessment & Plan:   Critically ill due to Acute Hypoxic Respiratory Failure presumed secondary to aspiration PNA  - failed HFNC due to inability to control secretions. Location of tumor/bleed likely affecting pharyngeal function.  -Trached 4/27 -S/P 5 days of Zosyn  P: Routine trach care  Pulmonary hygiene  PMV trails as tolerated  Wean FiO2 for sats greater than 90%. Head of bed elevated 30 degrees. Mobilization as able  CPA Mass s/p Resection, with hemorrhage s/p evacuation  -Pathology reveals cystic schwannoma P: Management per neurology  Maintain neuro protective measures; goal for eurothermia, euglycemia, eunatermia, normoxia Nutrition and bowel regiment  Seizure precautions  Enteral BP management   Fever  -Spiked fever again overnight with T-max 103.1 etiology unknown. No reported increased tracheal secretions or dysuria. WBC remains stable  P: Repeat cultures today  Follow closely for need of antibiotics, has received 5 days of Zosyn thus far for aspiration PNA, he may need further treatment of this   Dysphagia due to tumor  -Underwent PEG placement 4/30 P: TF for nutritional supplementation  Protein supplementation SLP eval    H/O Heart Block V.Fib Arrest 2013 s/p Pacemaker P: Continuous telemetry Enteral BP management   Agitated delirium P: Continue HS Seroquel   Best practice:  Diet: TF Pain/Anxiety/Delirium protocol (if indicated): PRNs VAP protocol (if indicated): In place  DVT prophylaxis: Heparin SQ GI prophylaxis: PPI Glucose control:  Monitor  Mobility: Up as tolerated  Code Status: Full Family Communication: Per primary  Disposition: ICU   Labs   CBC: Recent Labs  Lab 10/14/19 0431 10/14/19 0626 10/14/19 0930 10/15/19 0635 10/16/19 0421 10/17/19 0254 10/19/19 0239  WBC 18.4*  --   --  11.8* 10.2 13.6* 13.0*  HGB 11.4*   < > 10.5* 9.8* 10.3* 11.3* 11.4*  HCT 34.5*   < > 31.0* 30.1* 31.6* 34.8* 35.2*  MCV 98.9  --   --   101.7* 101.3* 98.9 100.0  PLT 187  --   --  164 164 186 163   < > = values in this interval not displayed.    Basic Metabolic Panel: Recent Labs  Lab 10/13/19 0531 10/13/19 0900 10/13/19 1700 10/13/19 2352 10/14/19 0431 10/14/19 0626 10/14/19 0930 10/15/19 [id number] 10/16/19 0421 10/17/19 0254 10/19/19 0239  NA  --    < >  --    < > 145   < > 147* 147* 147* 147* 151*  K  --    < >  --    < > 3.7   < > 3.6 3.9 3.9 4.2 3.7  CL  --    < >  --   --  114*  --   --  120* 116* 113* 116*  CO2  --    < >  --   --  22  --   --  23 19* 24 24  GLUCOSE  --    < >  --   --  126*  --   --  105* 138* 108* 145*  BUN  --    < >  --   --  37*  --   --  34* 32* 27* 34*  CREATININE  --    < >  --   --  0.79  --   --  0.66 0.68 0.59* 0.92  CALCIUM  --    < >  --   --  8.1*  --   --  7.7* 7.8* 8.1* 8.3*  MG 2.1  --  2.0  --   --   --   --  2.0 2.0 2.1  --   PHOS 1.8*  --  3.2  --   --   --   --  3.0 2.9 3.2  --    < > = values in this interval not displayed.   GFR: Estimated Creatinine Clearance: 72.2 mL/min (by C-G formula based on SCr of 0.92 mg/dL). Recent Labs  Lab 10/13/19 0953 10/14/19 0431 10/15/19 0635 10/16/19 0421 10/17/19 0254 10/19/19 0239  PROCALCITON 0.30  --   --   --   --   --   WBC 19.2*   < > 11.8* 10.2 13.6* 13.0*  LATICACIDVEN 0.6  --   --   --   --   --    < > = values in this interval not displayed.    Liver Function Tests: Recent Labs  Lab 10/13/19 0953 10/19/19 0239  AST 25 36  ALT 48* 64*  ALKPHOS 63 69  BILITOT 1.2 0.9  PROT 5.4* 5.8*  ALBUMIN 2.5* 2.4*   No results for input(s): LIPASE, AMYLASE in the last 168 hours. No results for input(s): AMMONIA in the last 168 hours.  ABG    Component Value Date/Time   PHART 7.424 10/14/2019 0930   PCO2ART 35.5 10/14/2019 0930   PO2ART 102 10/14/2019 0930   HCO3 23.0 10/14/2019  0930   TCO2 24 10/14/2019 0930   ACIDBASEDEF 1.0 10/14/2019 0930   O2SAT 98.0 10/14/2019 0930     Coagulation Profile: No results  for input(s): INR, PROTIME in the last 168 hours.  Cardiac Enzymes: No results for input(s): CKTOTAL, CKMB, CKMBINDEX, TROPONINI in the last 168 hours.  HbA1C: No results found for: HGBA1C  CBG: Recent Labs  Lab 10/18/19 1518 10/18/19 1905 10/18/19 2312 10/19/19 0313 10/19/19 0733  GLUCAP 106* 126* 121* 120* 125*      Critical care time:    Performed by: Johnsie Cancel  Total critical care time: 37 minutes  Critical care time was exclusive of separately billable procedures and treating other patients.  Critical care was necessary to treat or prevent imminent or life-threatening deterioration.  Critical care was time spent personally by me on the following activities: development of treatment plan with patient and/or surrogate as well as nursing, discussions with consultants, evaluation of patient's response to treatment, examination of patient, obtaining history from patient or surrogate, ordering and performing treatments and interventions, ordering and review of laboratory studies, ordering and review of radiographic studies, pulse oximetry and re-evaluation of patient's condition.  Johnsie Cancel, NP-C [facility] Contact / Pager information can be found on Amion  10/19/2019, 7:35 AM

## 2019-10-19 NOTE — Consult Note (Signed)
Casey Richard                                                                               10/19/2019                               Ophthalmology Consultation                                         Consult requested by: Dr. Emelda Brothers  Reason for consultation:  Right eye exposure and redness  HPI: 64 yo male with history of resection of mass and complicated by post op hemorrhage and need for evacuation.  Brainstem mass effect from obstructive hydrocephalus improving with CN dysfunction.  Mass was identified as cystic schwannoma.  He has worsening redness associated with exposure in right eye.    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 07/09/2011   NSTEMI (non-ST elevated myocardial infarction) ([place]) 07/10/2011   CHB (complete heart block) ([place])    Presence of permanent cardiac pacemaker 07/15/2011   Memory loss, short term    Nonsustained ventricular tachycardia ([place]) 02/07/2014   Paroxysmal atrial tachycardia ([place]) 10/27/2015   Resolved Ambulatory Problems    Diagnosis Date Noted   No Resolved Ambulatory Problems   Past Medical History:  Diagnosis Date   Cardiac arrest [facility])    Chronic kidney disease    Dysrhythmia 1/13   Seasonal allergies    Shortness of breath      Pertinent Ophthalmic History: None elicited - patient on trach tube  Current Eye Medications: Goniosol  Systemic medications on admission:   Medications Prior to Admission  Medication Sig Dispense Refill   acetaminophen (TYLENOL) 500 MG tablet Take 1,000 mg by mouth every 6 (six) hours as needed. Pain      Cholecalciferol (HM VITAMIN D3) 4000 units CAPS Take 1 capsule by mouth daily.     cyanocobalamin 2000 MCG tablet Take 2,000 mcg by mouth daily.     Multiple Vitamin (MULITIVITAMIN WITH  MINERALS) TABS Take 1 tablet by mouth daily.          ROS: (taken from admission) Constitutional: Denies fever, chills, diaphoresis, poor appetite and fatigue.  HEENT: Denies photophobia, eye pain, redness, hearing loss, ear pain, congestion, sore throat, rhinorrhea, sneezing, mouth sores, trouble swallowing, neck pain, neck stiffness and tinnitus.   Respiratory: Denies SOB, DOE, cough, chest tightness,  and wheezing.   Cardiovascular: Denies chest pain, palpitations and leg swelling.  Gastrointestinal: Denies nausea, vomiting, abdominal pain, diarrhea, constipation, blood in stool and abdominal distention.  Genitourinary: Denies dysuria, urgency, frequency, hematuria, flank pain and difficulty urinating.  Musculoskeletal: Please see HPI Neurological: Please see HPI Hematological: Denies adenopathy. Easy bruising, personal or family bleeding history  Psychiatric/Behavioral: Denies suicidal ideation, mood changes, confusion, nervousness, sleep disturbance and agitation  Visual Fields:  Unable to determine  due to trach tube   Pupils:  Pharmacologically dilated at my direction before exam    Near acuity:  Unable to determine precise vision, based on interaction with patient, vision approximately 20/50 OU  TA:       OD: 7 mmhg    OS: 6 mmhg  by Tonopen     Dilation:  Tropicamide  External:   OD:  Lid laxity, mild proptosis associated with exposure keratopathy and exposure and edema of conjunctiva, mild to moderate mucopurulent discharge   OS:  Normal with lid laxity  Anterior segment exam:  By penlight    Conjunctiva:  OD:  2-3+ injection with conjunctival edema   OS:  Quiet    Cornea:    OD: Clear, no evidence of corneal abrasion, irregular epithelium secondary to exposure   OS: Clear  Anterior Chamber:   OD:  Deep/quiet    OS:  Deep/quiet   Iris:    OD:  Normal      OS:  Normal      Lens:    OD:  Clear         OS:  Clear        Optic disc:  OD:  Flat, sharp, pink,  healthy     OS:  Flat, sharp, pink, healthy     Central retina--examined with indirect ophthalmoscope:  OD:  Macula and vessels normal; media clear     OS:  Macula and vessels normal; media clear      Impression:  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 Please contact our office with any questions or concerns at [phone].   Jalene Mullet, MD

## 2019-10-20 LAB — GLUCOSE, CAPILLARY
Glucose-Capillary: 100 mg/dL — ABNORMAL HIGH (ref 70–99)
Glucose-Capillary: 106 mg/dL — ABNORMAL HIGH (ref 70–99)
Glucose-Capillary: 119 mg/dL — ABNORMAL HIGH (ref 70–99)
Glucose-Capillary: 129 mg/dL — ABNORMAL HIGH (ref 70–99)
Glucose-Capillary: 129 mg/dL — ABNORMAL HIGH (ref 70–99)
Glucose-Capillary: 93 mg/dL (ref 70–99)

## 2019-10-20 LAB — CBC
HCT: 35.9 % — ABNORMAL LOW (ref 39.0–52.0)
Hemoglobin: 11.2 g/dL — ABNORMAL LOW (ref 13.0–17.0)
MCH: 32.5 pg (ref 26.0–34.0)
MCHC: 31.2 g/dL (ref 30.0–36.0)
MCV: 104.1 fL — ABNORMAL HIGH (ref 80.0–100.0)
Platelets: 146 10*3/uL — ABNORMAL LOW (ref 150–400)
RBC: 3.45 MIL/uL — ABNORMAL LOW (ref 4.22–5.81)
RDW: 13.7 % (ref 11.5–15.5)
WBC: 13.8 10*3/uL — ABNORMAL HIGH (ref 4.0–10.5)
nRBC: 0 % (ref 0.0–0.2)

## 2019-10-20 LAB — BASIC METABOLIC PANEL
Anion gap: 12 (ref 5–15)
BUN: 27 mg/dL — ABNORMAL HIGH (ref 8–23)
CO2: 26 mmol/L (ref 22–32)
Calcium: 8.4 mg/dL — ABNORMAL LOW (ref 8.9–10.3)
Chloride: 114 mmol/L — ABNORMAL HIGH (ref 98–111)
Creatinine, Ser: 0.68 mg/dL (ref 0.61–1.24)
GFR calc Af Amer: >60 mL/min (ref >60)
GFR calc non Af Amer: >60 mL/min (ref >60)
Glucose, Bld: 117 mg/dL — ABNORMAL HIGH (ref 70–99)
Potassium: 4 mmol/L (ref 3.5–5.1)
Sodium: 152 mmol/L — ABNORMAL HIGH (ref 135–145)

## 2019-10-20 MED ORDER — VANCOMYCIN HCL 1500 MG/300ML IV SOLN
1500.0000 mg | INTRAVENOUS | Status: DC
  Administered 2019-10-21: 13:00:00 1500 mg via INTRAVENOUS
  Filled 2019-10-20 (×2): qty 300

## 2019-10-20 MED ORDER — FREE WATER
200.0000 mL | Status: DC
  Administered 2019-10-20 – 2019-10-21 (×4): 200 mL

## 2019-10-20 MED ORDER — SODIUM CHLORIDE 0.9 % IV SOLN: 2.0000 g | INTRAVENOUS | Status: DC

## 2019-10-20 MED ORDER — VANCOMYCIN HCL 1250 MG/250ML IV SOLN
1250.0000 mg | Freq: Once | INTRAVENOUS | Status: AC
  Administered 2019-10-20: 16:00:00 1250 mg via INTRAVENOUS
  Filled 2019-10-20: qty 250

## 2019-10-20 NOTE — Evaluation (Signed)
Clinical/Bedside Swallow Evaluation Patient Details  Name: Casey Richard MRN: [id number] Date of Birth: 1955-12-08  Today's Date: 10/20/2019 Time: SLP Start Time (ACUTE ONLY): 75 SLP Stop Time (ACUTE ONLY): 1644 SLP Time Calculation (min) (ACUTE ONLY): 18 min  Past Medical History:  Past Medical History:  Diagnosis Date  . Cardiac arrest ([place])   . CHB (complete heart block) ([place])   . 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 Dysrhythmia 1/13   bradycardia, VF, cardiac arrest/complete heart block with pacer inserted/LOV Dr Sallyanne Kuster 07/21/11 with interrogation and anesthesia guideline order on chart. Chest x ray 1/13 EPIC,, TEE and [facility]  1/13 EPIC  . Memory loss, short term    from fall with heart block 1/13  . Nonsustained ventricular tachycardia ([place]) 02/07/2014  . Presence of permanent cardiac pacemaker 07/15/2011   Medtronic Revo  . Seasonal allergies   . Shortness of breath    Past Surgical History:  Past Surgical History:  Procedure Laterality Date  . CARDIAC CATHETERIZATION  07/09/2011   Normal coronaries  . CRANIOTOMY Right 10/07/2019   Procedure: RIGHT CRANIOTOMY TUMOR EXCISION;  Surgeon: Judith Part, MD;  Location: [place];  Service: Neurosurgery;  Laterality: Right;  . CRANIOTOMY Right 10/10/2019   Procedure: CRANIOTOMY FOR EVACUATION OF HEMATOMA;  Surgeon: Judith Part, MD;  Location: [place];  Service: Neurosurgery;  Laterality: Right;  CRANIOTOMY TUMOR EXCISION  . CYSTOSCOPY WITH URETHRAL DILATATION  08/19/2011   Procedure: CYSTOSCOPY WITH URETHRAL DILATATION;  Surgeon: Dutch Gray, MD;  Location: [facility];  Service: Urology;  Laterality: N/A;  Balloon Dilation of Urethral Stricture, retro-urethrogram, Suprapubic change   . LEFT HEART CATHETERIZATION WITH CORONARY ANGIOGRAM N/A 07/09/2011   Procedure: LEFT HEART CATHETERIZATION WITH CORONARY ANGIOGRAM;  Surgeon: Jettie Booze, MD;  Location: [facility] CATH LAB;  Service:  Cardiovascular;  Laterality: N/A;  . PACEMAKER INSERTION  07/15/11   Medtronic Revo  . PERMANENT PACEMAKER INSERTION Left 07/15/2011   Procedure: PERMANENT PACEMAKER INSERTION;  Surgeon: Sanda Klein, MD;  Location: [place] CATH LAB;  Service: Cardiovascular;  Laterality: Left;  . TEMPORARY PACEMAKER INSERTION Right 07/09/2011   Procedure: TEMPORARY PACEMAKER INSERTION;  Surgeon: Jettie Booze, MD;  Location: [facility] CATH LAB;  Service: Cardiovascular;  Laterality: Right;   HPI:  Pt is a 64 y.o. male admitted with dizziness and gait instability.  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 MRI with good debulking of tumor, pre-post op measurements showed decrease in tumor volume from 19.7cc-->4.3cc. 4/22 presented with progressive obtundation, CTH with post-op resection cavity hemorrhage not present on MRI, brainstem compression and developing obstructive hydrocephalus, taken back to OR 4/22 for hematoma removal and hemostasis. Per surgery notes, unfortunately, as expected, there was post-op cranial nerve dysfunction after hematoma evacuation despite normal intra-op stimulation. Post-op CTH with improved mass effect on brainstem, slowly improving ventricular size with some IVH. ETT 4/26 Trach 4/27. PEG placed on 4/30. CXR 4/30: Bilateral LOWER lung airspace opacities again noted, slightly improved on left.   Assessment / Plan / Recommendation Clinical Impression  Pt was seen for bedside swallow evaluation with Passy Muir Speaking Valve (PMSV) in place. Vitals were stable throughout the evaluation and PMSV was removed at the end of the evaluation. Pt exhibited a wet vocal quality and coughing following individual ice chips and thin liquids via tsp  with need for consistent suctioning. Considering the results of the modified barium swallow study on 10/11/19 prior to  trach placement, aspiration is likely. It is recommended that the pt's NPO status be maintained at this time with oral care QID. Allowance of intermittent ice chips may be initiated again in subsequent session. SLP will follow for dysphagia treatment.  SLP Visit Diagnosis: Dysphagia, oropharyngeal phase (R13.12)    Aspiration Risk  Severe aspiration risk    Diet Recommendation NPO;Alternative means - long-term   Medication Administration: Via alternative means    Other  Recommendations Oral Care Recommendations: Oral care QID   Follow up Recommendations Inpatient Rehab      Frequency and Duration min 3x week  2 weeks       Prognosis Prognosis for Safe Diet Advancement: Fair Barriers to Reach Goals: Severity of deficits      Swallow Study   General Date of Onset: 10/18/19 HPI: Pt is a 64 y.o. male admitted with dizziness and gait instability.  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 MRI with good debulking of tumor, pre-post op measurements showed decrease in tumor volume from 19.7cc-->4.3cc. 4/22 presented with progressive obtundation, CTH with post-op resection cavity hemorrhage not present on MRI, brainstem compression and developing obstructive hydrocephalus, taken back to OR 4/22 for hematoma removal and hemostasis. Per surgery notes, unfortunately, as expected, there was post-op cranial nerve dysfunction after hematoma evacuation despite normal intra-op stimulation. Post-op CTH with improved mass effect on brainstem, slowly improving ventricular size with some IVH. ETT 4/26 Trach 4/27. PEG placed on 4/30. CXR 4/30: Bilateral LOWER lung airspace opacities again noted, slightly improved on left. Type of Study: Bedside Swallow Evaluation Previous Swallow Assessment: MBS on 4/23 Diet Prior to this Study: NPO Temperature Spikes Noted:  No Respiratory Status: Trach Collar;Trach Trach Size and Type: #6;Cuff;Deflated;With PMSV in place History of Recent Intubation: Yes Length of Intubations (days): 1 days Date extubated: 10/15/19 Behavior/Cognition: Alert;Cooperative Oral Cavity Assessment: Within Functional Limits Oral Care Completed by SLP: No Vision: Functional for self-feeding Self-Feeding Abilities: Needs assist Patient Positioning: Upright in bed;Postural control adequate for testing Baseline Vocal Quality: Hoarse;Low vocal intensity Volitional Cough: Weak Volitional Swallow: Able to elicit    Oral/Motor/Sensory Function Overall Oral Motor/Sensory Function: Mild impairment(MIld-moderate) Facial ROM: Reduced right;Suspected CN VII (facial) dysfunction Facial Symmetry: Abnormal symmetry right;Suspected CN VII (facial) dysfunction Facial Sensation: Suspected CN V (Trigeminal) dysfunction;Reduced right Lingual Symmetry: Within Functional Limits Velum: Within Functional Limits   Ice Chips Ice chips: Impaired Presentation: Spoon Pharyngeal Phase Impairments: Throat Clearing - Immediate;Wet Vocal Quality;Cough - Delayed   Thin Liquid Thin Liquid: Impaired Presentation: Spoon Pharyngeal  Phase Impairments: Wet Vocal Quality;Cough - Immediate;Throat Clearing - Immediate    Nectar Thick Nectar Thick Liquid: Not tested   Honey Thick Honey Thick Liquid: Not tested   Puree Puree: Not tested   Solid     Solid: Not tested     Evoleth Nordmeyer I. Hardin Negus, [place], [place] Office number [phone] Pager [phone]  Horton Marshall 10/20/2019,5:42 PM

## 2019-10-20 NOTE — Evaluation (Signed)
[facility] Speaking Valve - Evaluation Patient Details  Name: Casey Richard MRN: [id number] Date of Birth: [birth year]  Today's Date: 10/20/2019 Time: [id number] SLP Time Calculation (min) (ACUTE ONLY): 20 min  Past Medical History:  Past Medical History:  Diagnosis Date  . Cardiac arrest ([place])   . CHB (complete heart block) ([place])   . 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 Dysrhythmia 1/13   bradycardia, VF, cardiac arrest/complete heart block with pacer inserted/LOV Dr Sallyanne Kuster 07/21/11 with interrogation and anesthesia guideline order on chart. Chest x ray 1/13 [facility],, [facility] and [facility]  1/13 [facility]  . Memory loss, short term    from fall with heart block 1/13  . Nonsustained ventricular tachycardia ([place]) 02/07/2014  . Presence of permanent cardiac pacemaker 07/15/2011   Medtronic Revo  . Seasonal allergies   . Shortness of breath    Past Surgical History:  Past Surgical History:  Procedure Laterality Date  . CARDIAC CATHETERIZATION  07/09/2011   Normal coronaries  . CRANIOTOMY Right 10/07/2019   Procedure: RIGHT CRANIOTOMY TUMOR EXCISION;  Surgeon: Judith Part, MD;  Location: [place];  Service: Neurosurgery;  Laterality: Right;  . CRANIOTOMY Right 10/10/2019   Procedure: CRANIOTOMY FOR EVACUATION OF HEMATOMA;  Surgeon: Judith Part, MD;  Location: [place];  Service: Neurosurgery;  Laterality: Right;  CRANIOTOMY TUMOR EXCISION  . CYSTOSCOPY WITH URETHRAL DILATATION  08/19/2011   Procedure: CYSTOSCOPY WITH URETHRAL DILATATION;  Surgeon: Dutch Gray, MD;  Location: [facility];  Service: Urology;  Laterality: N/A;  Balloon Dilation of Urethral Stricture, retro-urethrogram, Suprapubic change   . LEFT HEART CATHETERIZATION WITH CORONARY ANGIOGRAM N/A 07/09/2011   Procedure: LEFT HEART CATHETERIZATION WITH CORONARY ANGIOGRAM;  Surgeon: Jettie Booze, MD;  Location: [facility] CATH LAB;  Service: Cardiovascular;  Laterality: N/A;  . PACEMAKER INSERTION   07/15/11   Medtronic Revo  . PERMANENT PACEMAKER INSERTION Left 07/15/2011   Procedure: PERMANENT PACEMAKER INSERTION;  Surgeon: Sanda Klein, MD;  Location: [place] CATH LAB;  Service: Cardiovascular;  Laterality: Left;  . TEMPORARY PACEMAKER INSERTION Right 07/09/2011   Procedure: TEMPORARY PACEMAKER INSERTION;  Surgeon: Jettie Booze, MD;  Location: [facility] CATH LAB;  Service: Cardiovascular;  Laterality: Right;   HPI:  Pt is a 64 y.o. male admitted with dizziness and gait instability.  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 MRI with good debulking of tumor, pre-post op measurements showed decrease in tumor volume from 19.7cc-->4.3cc. 4/22 presented with progressive obtundation, CTH with post-op resection cavity hemorrhage not present on MRI, brainstem compression and developing obstructive hydrocephalus, taken back to OR 4/22 for hematoma removal and hemostasis. Per surgery notes, unfortunately, as expected, there was post-op cranial nerve dysfunction after hematoma evacuation despite normal intra-op stimulation. Post-op CTH with improved mass effect on brainstem, slowly improving ventricular size with some IVH. ETT 4/26 Trach 4/27. PEG placed on 4/30. CXR 4/30: Bilateral LOWER lung airspace opacities again noted, slightly improved on left.   Assessment / Plan / Recommendation Clinical Impression  Pt was seen for PMV evaluation. He was alert and cooperative throughout the evaluation but reported c/o headache. He was educated regarding the anatomy of the larynx, the impact of the trach on voicing, and the goals of the session. Pt verbalized understanding. Cuff was deflated upon SLP's entry. Pt tolerated finger occlusion without evidence of air  trapping. He was able to count to five with finger occlusion and exhibited coughing thereafter with expulsion of  secretions from the trach. He tolerated PMSV for 37 minutes including time of bedside swallow evaluation with vitals ranging RR 13-22, SpO2 99-100, and HR 92-94. Pt was able to expectorate secretions orally following PMSV placement. Vocal quality was hoarse with reduced vocal intensity.  He exhibited difficulty coordinating respiration with speech but was responsive to verbal cueing to increase breath support. Speech intelligibility was reduced secondary to articulatory imprecision and impaired vocal quality but it improved as the session progressed due to improvement in vocal quality. PMSV was removed at the end of session and cuff was left deflated. PMSV may be intermittently placed for brief periods to facilitate communication if full staff supervision is provided. SLP will continue to follow pt. SLP Visit Diagnosis: Aphonia (R49.1)    SLP Assessment  Patient needs continued Speech Lanaguage Pathology Services    Follow Up Recommendations  Inpatient Rehab    Frequency and Duration min 3x week  2 weeks    PMSV Trial PMSV was placed for: 40 Able to redirect subglottic air through upper airway: Yes Able to Attain Phonation: Yes Voice Quality: Hoarse;Low vocal intensity Able to Expectorate Secretions: Yes Level of Secretion Expectoration with PMSV: Tracheal;Oral Breath Support for Phonation: Moderately decreased Intelligibility: Intelligibility reduced Word: 50-74% accurate Phrase: 25-49% accurate Sentence: 25-49% accurate Conversation: 25-49% accurate Respirations During Trial: (13-22) SpO2 During Trial: (99-100) Pulse During Trial: (92-94) Behavior: Alert;Cooperative;Good eye contact;Responsive to questions   Tracheostomy Tube       Vent Dependency  FiO2 (%): 28 %    Cuff Deflation Trial  Uziel Covault I. Hardin Negus, MS, [place] Office number [phone] Pager [phone]  Tolerated Cuff Deflation: (Cuff deflated at baseline)        Horton Marshall 10/20/2019, 5:22 PM

## 2019-10-20 NOTE — Plan of Care (Signed)
  Problem: Pain Managment: Goal: General experience of comfort will improve Outcome: Progressing   

## 2019-10-20 NOTE — Progress Notes (Signed)
Neurosurgery Service Progress Note  Subjective: No complaints, no issues overnight  Objective: Vitals:   10/20/19 0700 10/20/19 0800 10/20/19 0820 10/20/19 0821  BP: 140/88   131/84  Pulse: 94     Resp: 15     Temp:  98.4 F (36.9 C)    TempSrc:  Oral    SpO2: 99%  100%   Weight:      Height:       Temp (24hrs), Avg:98.2 F (36.8 C), Min:97.4 F (36.3 C), Max:98.9 F (37.2 C)  CBC Latest Ref Rng & Units 10/19/2019 10/17/2019 10/16/2019  WBC 4.0 - 10.5 K/uL 13.0(H) 13.6(H) 10.2  Hemoglobin 13.0 - 17.0 g/dL 11.4(L) 11.3(L) 10.3(L)  Hematocrit 39.0 - 52.0 % 35.2(L) 34.8(L) 31.6(L)  Platelets 150 - 400 K/uL 163 186 164   BMP Latest Ref Rng & Units 10/19/2019 10/17/2019 10/16/2019  Glucose 70 - 99 mg/dL 145(H) 108(H) 138(H)  BUN 8 - 23 mg/dL 34(H) 27(H) 32(H)  Creatinine 0.61 - 1.24 mg/dL 0.92 0.59(L) 0.68  Sodium 135 - 145 mmol/L 151(H) 147(H) 147(H)  Potassium 3.5 - 5.1 mmol/L 3.7 4.2 3.9  Chloride 98 - 111 mmol/L 116(H) 113(H) 116(H)  CO2 22 - 32 mmol/L 24 24 19(L)  Calcium 8.9 - 10.3 mg/dL 8.3(L) 8.1(L) 7.8(L)    Intake/Output Summary (Last 24 hours) at 10/20/2019 0842 Last data filed at 10/20/2019 0700 Gross per 24 hour  Intake 2010 ml  Output 3925 ml  Net -1915 ml    Current Facility-Administered Medications:  .  0.9 %  sodium chloride infusion, , Intravenous, PRN, Judith Part, MD, Last Rate: 10 mL/hr at 10/20/19 0600, Rate Verify at 10/20/19 0600 .  acetaminophen (TYLENOL) 160 MG/5ML solution 650 mg, 650 mg, Per Tube, Q4H PRN, Ashok Pall, MD, 650 mg at 10/19/19 2118 .  [DISCONTINUED] acetaminophen (TYLENOL) tablet 650 mg, 650 mg, Oral, Q4H PRN **OR** acetaminophen (TYLENOL) suppository 650 mg, 650 mg, Rectal, Q4H PRN, Judith Part, MD .  amLODipine (NORVASC) tablet 10 mg, 10 mg, Per Tube, Daily, Agarwala, Ravi, MD, 10 mg at 10/19/19 0953 .  bethanechol (URECHOLINE) tablet 10 mg, 10 mg, Per Tube, TID, Kipp Brood, MD, 10 mg at 10/19/19 2118 .   chlorhexidine (PERIDEX) 0.12 % solution 15 mL, 15 mL, Mouth Rinse, BID, Madailein Londo, Joyice Faster, MD, 15 mL at 10/20/19 0829 .  Chlorhexidine Gluconate Cloth 2 % PADS 6 each, 6 each, Topical, Daily, Judith Part, MD, 6 each at 10/19/19 0900 .  docusate (COLACE) 50 MG/5ML liquid 100 mg, 100 mg, Per Tube, Daily, Merlene Laughter F, NP, 100 mg at 10/19/19 0951 .  [START ON 10/22/2019] erythromycin ophthalmic ointment, , Right Eye, BID, Jalene Mullet, MD .  feeding supplement (OSMOLITE 1.2 CAL) liquid 1,000 mL, 1,000 mL, Per Tube, Continuous, Katharine Rochefort, Joyice Faster, MD, Last Rate: 70 mL/hr at 10/20/19 0600, Rate Verify at 10/20/19 0600 .  feeding supplement (PRO-STAT SUGAR FREE 64) liquid 30 mL, 30 mL, Per Tube, Daily, Judith Part, MD, 30 mL at 10/19/19 0951 .  free water 200 mL, 200 mL, Per Tube, Q6H, Merlene Laughter F, NP, 200 mL at 10/20/19 0520 .  heparin injection 5,000 Units, 5,000 Units, Subcutaneous, Q8H, Kipp Brood, MD, 5,000 Units at 10/20/19 0520 .  HYDROcodone-acetaminophen (NORCO/VICODIN) 5-325 MG per tablet 1 tablet, 1 tablet, Per Tube, Q4H PRN, Ashok Pall, MD, 1 tablet at 10/19/19 2118 .  HYDROmorphone (DILAUDID) injection 0.5 mg, 0.5 mg, Intravenous, Q3H PRN, Judith Part, MD, 0.5 mg at  10/19/19 0931 .  ipratropium-albuterol (DUONEB) 0.5-2.5 (3) MG/3ML nebulizer solution 3 mL, 3 mL, Nebulization, BID, Candee Furbish, MD, 3 mL at 10/20/19 0820 .  labetalol (NORMODYNE) injection 10-40 mg, 10-40 mg, Intravenous, Q10 min PRN, Judith Part, MD, 20 mg at 10/18/19 1404 .  lisinopril (ZESTRIL) tablet 5 mg, 5 mg, Per Tube, Daily, Jonita Hirota, Joyice Faster, MD, 5 mg at 10/19/19 0956 .  MEDLINE mouth rinse, 15 mL, Mouth Rinse, q12n4p, Kendra Woolford, Joyice Faster, MD, 15 mL at 10/19/19 1608 .  neomycin-polymyxin b-dexamethasone (MAXITROL) ophthalmic ointment, , Right Eye, TID, Jalene Mullet, MD, Given at 10/19/19 2119 .  ondansetron (ZOFRAN) tablet 4 mg, 4 mg, Oral, Q4H PRN **OR**  ondansetron (ZOFRAN) injection 4 mg, 4 mg, Intravenous, Q4H PRN, Haydyn Liddell A, MD .  pantoprazole sodium (PROTONIX) 40 mg/20 mL oral suspension 40 mg, 40 mg, Per Tube, Daily, Colie Josten, Joyice Faster, MD, 40 mg at 10/19/19 1006 .  promethazine (PHENERGAN) tablet 12.5-25 mg, 12.5-25 mg, Per Tube, Q4H PRN, Judith Part, MD .  QUEtiapine (SEROQUEL) tablet 50 mg, 50 mg, Per Tube, QHS, Judith Part, MD, 50 mg at 10/19/19 2118   Physical Exam: On TC, FCx4 and interactive, eyes open, +R injection / corneal edema improving, stable R cranial nerve palsies  Assessment & Plan: 64 y.o. 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 MRI with good debulking of tumor, pre-post op measurements show decrease in tumor volume from 19.7cc-->4.3cc. 4/22 progressive obtundation, CTH with post-op resection cavity hemorrhage not present on MRI, brainstem compression and developing obstructive hydrocephalus, taken back to OR for hematoma removal and hemostasis. Unfortunately, as expected, post-op cranial nerve dysfunction after hematoma evacuation despite normal intra-op stimulation. Post-op CTH with improved mass effect on brainstem, slowly improving ventricular size with some IVH, 4/26 CTH stable ventricular size, hemorrhage resolving, 4/26 intubated for aspiration / PNA, 4/27 trach, 4/30 s/p PEG  Neuro -ophtho recs for R eye, looks like he was switched to erythromycin and maxitrol ointment, unclear duration of Tx -final path cystic schwannoma  Cardiopulm -wean O2 as tolerated on TC, working towards speaking valve  FENGI -qd RFP for hypernatremia, on 200q6 FWF, will increase if hypernatremia persists -BG well controlled, can d/c checks -s/p PEG, on TF & FWF  Heme/ID -febrile yesterday, Cx pending  PPx/Dispo -transfer to stepdown pending -PT/OT rec CIR, f/u PM&R recs -SCDs/TEDs, SQH  Judith Part  10/20/19 8:42 AM

## 2019-10-20 NOTE — Progress Notes (Signed)
Pharmacy Antibiotic Note  Casey Richard is a 64 y.o. male admitted on 09/27/2019 with cystic brain lesion s/p resection.   Pharmacy has been consulted for vancomycin dosing for PNA. Trach aspirate growing s. Aureus. MRSA PCR negative on 4/18. WBC 13, Tmax 101.2.   Vancomycin 1500 mg IV Q 24 hrs. Goal AUC 400-550. Expected AUC: 485 SCr used: 0.72 Wt for CrCl and Ke: TBW (TBW < IBW)   Plan: Vancomycin 1250mg  x1 loading dose Start vancomycin 1500 mg IV q24h  Monitor renal function, cultures/sensitivites, and clinical progression   Height: 5\' 10"  (177.8 cm) Weight: 61 kg (134 lb 7.7 oz) IBW/kg (Calculated) : 73  Temp (24hrs), Avg:98.7 F (37.1 C), Min:97.9 F (36.6 C), Max:100.4 F (38 C)  Recent Labs  Lab 10/15/19 0635 10/16/19 0421 10/17/19 0254 10/19/19 0239 10/20/19 1125  WBC 11.8* 10.2 13.6* 13.0* 13.8*  CREATININE 0.66 0.68 0.59* 0.92 0.68    Estimated Creatinine Clearance: 79.4 mL/min (by C-G formula based on SCr of 0.68 mg/dL).    No Known Allergies  Antimicrobials this admission: Vancomycin 5/2 >>   Dose adjustments this admission: N/A  Microbiology results: 4/18 MRSA PCR neg 4/25 TA: normal flora 4/25 Bcx: ngtd 5/1 bcx: ngtd  5/1 TA: mod s. Aureus  Thank you for allowing pharmacy to be a part of this patient's care.  Cristela Felt, PharmD PGY1 Pharmacy Resident [employer]: [phone]   10/20/2019 1:13 PM

## 2019-10-21 DIAGNOSIS — R0902 Hypoxemia: Secondary | ICD-10-CM | POA: Diagnosis not present

## 2019-10-21 LAB — BASIC METABOLIC PANEL
Anion gap: 8 (ref 5–15)
BUN: 23 mg/dL (ref 8–23)
CO2: 27 mmol/L (ref 22–32)
Calcium: 8.1 mg/dL — ABNORMAL LOW (ref 8.9–10.3)
Chloride: 112 mmol/L — ABNORMAL HIGH (ref 98–111)
Creatinine, Ser: 0.71 mg/dL (ref 0.61–1.24)
GFR calc Af Amer: >60 mL/min (ref >60)
GFR calc non Af Amer: >60 mL/min (ref >60)
Glucose, Bld: 110 mg/dL — ABNORMAL HIGH (ref 70–99)
Potassium: 3.5 mmol/L (ref 3.5–5.1)
Sodium: 147 mmol/L — ABNORMAL HIGH (ref 135–145)

## 2019-10-21 LAB — CBC
HCT: 32.8 % — ABNORMAL LOW (ref 39.0–52.0)
Hemoglobin: 10.3 g/dL — ABNORMAL LOW (ref 13.0–17.0)
MCH: 32.5 pg (ref 26.0–34.0)
MCHC: 31.4 g/dL (ref 30.0–36.0)
MCV: 103.5 fL — ABNORMAL HIGH (ref 80.0–100.0)
Platelets: 183 10*3/uL (ref 150–400)
RBC: 3.17 MIL/uL — ABNORMAL LOW (ref 4.22–5.81)
RDW: 13.3 % (ref 11.5–15.5)
WBC: 12.6 10*3/uL — ABNORMAL HIGH (ref 4.0–10.5)
nRBC: 0 % (ref 0.0–0.2)

## 2019-10-21 LAB — CULTURE, RESPIRATORY W GRAM STAIN

## 2019-10-21 LAB — GLUCOSE, CAPILLARY
Glucose-Capillary: 136 mg/dL — ABNORMAL HIGH (ref 70–99)
Glucose-Capillary: 93 mg/dL (ref 70–99)
Glucose-Capillary: 142 mg/dL — ABNORMAL HIGH (ref 70–99)
Glucose-Capillary: 94 mg/dL (ref 70–99)

## 2019-10-21 LAB — URINE CULTURE: Culture: NO GROWTH

## 2019-10-21 MED ORDER — FREE WATER
300.0000 mL | Status: DC
  Administered 2019-10-21 – 2019-10-23 (×13): 300 mL

## 2019-10-21 MED ORDER — ADULT MULTIVITAMIN LIQUID CH
15.0000 mL | Freq: Every day | ORAL | Status: DC
  Administered 2019-10-21 – 2019-11-15 (×26): 15 mL
  Filled 2019-10-21 (×27): qty 15

## 2019-10-21 NOTE — Discharge Summary (Addendum)
Discharge Summary  Date of Admission: 09/27/2019  Date of Discharge: 11/15/19  Attending Physician: Emelda Brothers, MD  Hospital Course: Patient was admitted for progressive falls, weight loss, and loss of ambulation. CT/MRI showed a very large right CPA mass consistent with a cystic schwannoma. He was taken to the OR on 4/19 for R retrosig resection of 6cm CPA mass. Post-op MRI showed good debulking of tumor, pre-post op measurements show decrease in tumor volume from 19.7cc-->4.3cc. On 4/22 he had progressive obtundation, CTH showed post-op resection cavity hemorrhage not present on MRI, brainstem compression and developing obstructive hydrocephalus. He was taken back to OR for hematoma removal and hemostasis. Unfortunately, as expected, post-op cranial nerve dysfunction after hematoma evacuation despite normal intra-op stimulation. Post-op CTH showed improved mass effect on brainstem, slowly improving ventricular size with some IVH, 4/26 CTH stable ventricular size, hemorrhage resolving. On 4/26 he was intubated for aspiration pneumonia. Due to his lower cranial nerve dysfunction and aspiration, trach was placed on 4/27 and PEG was placed on 4/30. He had a fever on 5/2, trach aspirate cultures grew S auerus, completed 7d course of ABx which resolved the PNA. He failed a repeat swallow on 5/13. He had a Hb drop with positive stool guaiac and received 2U pRBC, GI hemorrhage stopped with PPI with stable Hb. He remained inpatient due to 30d requirement for SNF until 5/27, when he was discharged to SNF.  Neurologic exam at discharge:  AOx1, PERRL, diffuse CN palsies on R from CN5-CN12 FCx4 with full strength  Discharge diagnosis: Vestibular schwannoma   Allergies as of 11/15/2019   No Known Allergies     Medication List    TAKE these medications   acetaminophen 500 MG tablet Commonly known as: TYLENOL Take 1,000 mg by mouth every 6 (six) hours as needed. Pain   amLODipine 10 MG  tablet Commonly known as: NORVASC Place 1 tablet (10 mg total) into feeding tube daily.   artificial tears Oint ophthalmic ointment Commonly known as: LACRILUBE Place into the right eye every 4 (four) hours.   bethanechol 10 MG tablet Commonly known as: URECHOLINE Place 1 tablet (10 mg total) into feeding tube 3 (three) times daily.   cyanocobalamin 2000 MCG tablet Take 2,000 mcg by mouth daily.   docusate 50 MG/5ML liquid Commonly known as: COLACE Place 10 mLs (100 mg total) into feeding tube daily.   feeding supplement (PRO-STAT SUGAR FREE 64) Liqd Place 30 mLs into feeding tube 2 (two) times daily.   free water Soln Place 200 mLs into feeding tube every 4 (four) hours.   heparin 5000 UNIT/ML injection Inject 1 mL (5,000 Units total) into the skin every 8 (eight) hours.   HM Vitamin D3 100 MCG (4000 UT) Caps Generic drug: Cholecalciferol Take 1 capsule by mouth daily.   ipratropium-albuterol 0.5-2.5 (3) MG/3ML Soln Commonly known as: DUONEB Take 3 mLs by nebulization every 6 (six) hours as needed.   Isosource 1.5 Cal Liqd 1,000 mLs by PEG Tube route daily.   lisinopril 5 MG tablet Commonly known as: ZESTRIL Place 1 tablet (5 mg total) into feeding tube daily.   multivitamin with minerals Tabs tablet Take 1 tablet by mouth daily.   pantoprazole sodium 40 mg/20 mL Pack Commonly known as: PROTONIX Place 20 mLs (40 mg total) into feeding tube daily.   QUEtiapine 25 MG tablet Commonly known as: SEROQUEL Place 1 tablet (25 mg total) into feeding tube at bedtime.        Judith Part,  MD

## 2019-10-21 NOTE — Progress Notes (Signed)
Neurosurgery Service Progress Note  Subjective: No complaints, no issues overnight, tolerated PMSV yesterday for session w/ SLP  Objective: Vitals:   10/21/19 0028 10/21/19 0413 10/21/19 0431 10/21/19 0500  BP: 119/86 133/84 133/84   Pulse: (!) 103 100 95   Resp: 15 18 15    Temp:  99.1 F (37.3 C)    TempSrc:  Oral    SpO2: 100% 98% 97%   Weight:    62.5 kg  Height:       Temp (24hrs), Avg:99.2 F (37.3 C), Min:98.2 F (36.8 C), Max:100.4 F (38 C)  CBC Latest Ref Rng & Units 10/20/2019 10/19/2019 10/17/2019  WBC 4.0 - 10.5 K/uL 13.8(H) 13.0(H) 13.6(H)  Hemoglobin 13.0 - 17.0 g/dL 11.2(L) 11.4(L) 11.3(L)  Hematocrit 39.0 - 52.0 % 35.9(L) 35.2(L) 34.8(L)  Platelets 150 - 400 K/uL 146(L) 163 186   BMP Latest Ref Rng & Units 10/20/2019 10/19/2019 10/17/2019  Glucose 70 - 99 mg/dL 117(H) 145(H) 108(H)  BUN 8 - 23 mg/dL 27(H) 34(H) 27(H)  Creatinine 0.61 - 1.24 mg/dL 0.68 0.92 0.59(L)  Sodium 135 - 145 mmol/L 152(H) 151(H) 147(H)  Potassium 3.5 - 5.1 mmol/L 4.0 3.7 4.2  Chloride 98 - 111 mmol/L 114(H) 116(H) 113(H)  CO2 22 - 32 mmol/L 26 24 24   Calcium 8.9 - 10.3 mg/dL 8.4(L) 8.3(L) 8.1(L)    Intake/Output Summary (Last 24 hours) at 10/21/2019 0654 Last data filed at 10/21/2019 [id number] Gross per 24 hour  Intake 2760 ml  Output 3100 ml  Net -340 ml    Current Facility-Administered Medications:  .  0.9 %  sodium chloride infusion, , Intravenous, PRN, Judith Part, MD, Last Rate: 10 mL/hr at 10/20/19 0600, Rate Verify at 10/20/19 0600 .  acetaminophen (TYLENOL) 160 MG/5ML solution 650 mg, 650 mg, Per Tube, Q4H PRN, Ashok Pall, MD, 650 mg at 10/20/19 1311 .  [DISCONTINUED] acetaminophen (TYLENOL) tablet 650 mg, 650 mg, Oral, Q4H PRN **OR** acetaminophen (TYLENOL) suppository 650 mg, 650 mg, Rectal, Q4H PRN, Judith Part, MD .  amLODipine (NORVASC) tablet 10 mg, 10 mg, Per Tube, Daily, Agarwala, Ravi, MD, 10 mg at 10/20/19 1059 .  bethanechol (URECHOLINE) tablet 10 mg, 10  mg, Per Tube, TID, Kipp Brood, MD, 10 mg at 10/20/19 2241 .  chlorhexidine (PERIDEX) 0.12 % solution 15 mL, 15 mL, Mouth Rinse, BID,  [place], Joyice Faster, MD, 15 mL at 10/20/19 2241 .  Chlorhexidine Gluconate Cloth 2 % PADS 6 each, 6 each, Topical, Daily, Judith Part, MD, 6 each at 10/20/19 0930 .  docusate (COLACE) 50 MG/5ML liquid 100 mg, 100 mg, Per Tube, Daily, Merlene Laughter F, NP, 100 mg at 10/20/19 1059 .  [START ON 10/22/2019] erythromycin ophthalmic ointment, , Right Eye, BID, Jalene Mullet, MD .  feeding supplement (OSMOLITE 1.2 CAL) liquid 1,000 mL, 1,000 mL, Per Tube, Continuous, Judith Part, MD, Last Rate: 70 mL/hr at 10/21/19 0351, Rate Verify at 10/21/19 0351 .  feeding supplement (PRO-STAT SUGAR FREE 64) liquid 30 mL, 30 mL, Per Tube, Daily, Quinnten Calvin, Joyice Faster, MD, 30 mL at 10/20/19 1059 .  free water 200 mL, 200 mL, Per Tube, Q4H, Merlene Laughter F, NP, 200 mL at 10/21/19 0420 .  heparin injection 5,000 Units, 5,000 Units, Subcutaneous, Q8H, Kipp Brood, MD, 5,000 Units at 10/21/19 [phone] .  HYDROcodone-acetaminophen (NORCO/VICODIN) 5-325 MG per tablet 1 tablet, 1 tablet, Per Tube, Q4H PRN, Ashok Pall, MD, 1 tablet at 10/19/19 2118 .  HYDROmorphone (DILAUDID) injection 0.5 mg, 0.5 mg, Intravenous,  Q3H PRN, Judith Part, MD, 0.5 mg at 10/19/19 0931 .  ipratropium-albuterol (DUONEB) 0.5-2.5 (3) MG/3ML nebulizer solution 3 mL, 3 mL, Nebulization, BID, Candee Furbish, MD, 3 mL at 10/20/19 2005 .  labetalol (NORMODYNE) injection 10-40 mg, 10-40 mg, Intravenous, Q10 min PRN, Judith Part, MD, 20 mg at 10/18/19 1404 .  lisinopril (ZESTRIL) tablet 5 mg, 5 mg, Per Tube, Daily, Nehal Shives, Joyice Faster, MD, 5 mg at 10/20/19 1059 .  MEDLINE mouth rinse, 15 mL, Mouth Rinse, q12n4p, Aydian Dimmick, Joyice Faster, MD, 15 mL at 10/20/19 1503 .  neomycin-polymyxin b-dexamethasone (MAXITROL) ophthalmic ointment, , Right Eye, TID, Jalene Mullet, MD, Given at 10/20/19 2248 .   ondansetron (ZOFRAN) tablet 4 mg, 4 mg, Oral, Q4H PRN **OR** ondansetron (ZOFRAN) injection 4 mg, 4 mg, Intravenous, Q4H PRN, Jalilah Wiltsie A, MD .  pantoprazole sodium (PROTONIX) 40 mg/20 mL oral suspension 40 mg, 40 mg, Per Tube, Daily, Yanci Bachtell, Joyice Faster, MD, 40 mg at 10/20/19 1059 .  promethazine (PHENERGAN) tablet 12.5-25 mg, 12.5-25 mg, Per Tube, Q4H PRN, Judith Part, MD .  QUEtiapine (SEROQUEL) tablet 50 mg, 50 mg, Per Tube, QHS, Judith Part, MD, 50 mg at 10/20/19 2241 .  vancomycin (VANCOREADY) IVPB 1500 mg/300 mL, 1,500 mg, Intravenous, Q24H, Henri Medal, RPH   Physical Exam: On TC, FCx4 and interactive, eyes open, +R injection / corneal edema improving, stable R cranial nerve palsies  Assessment & Plan: 64 y.o. 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 MRI with good debulking of tumor, pre-post op measurements show decrease in tumor volume from 19.7cc-->4.3cc. 4/22 progressive obtundation, CTH with post-op resection cavity hemorrhage not present on MRI, brainstem compression and developing obstructive hydrocephalus, taken back to OR for hematoma removal and hemostasis. Unfortunately, as expected, post-op cranial nerve dysfunction after hematoma evacuation despite normal intra-op stimulation. Post-op CTH with improved mass effect on brainstem, slowly improving ventricular size with some IVH, 4/26 CTH stable ventricular size, hemorrhage resolving, 4/26 intubated for aspiration / PNA, 4/27 trach, 4/30 s/p PEG, 5/2 fever, trach aspirate Cx +S auerus, started 7d course of vanc  Neuro -ophtho recs for R eye - maxitrol ointment tid day 3/3, erythromycin ointment afterwards day 0/4 then resume lacrilube gtt -final path cystic schwannoma  Cardiopulm -wean O2 as tolerated on TC, working towards speaking valve  FENGI -qd RFP for hypernatremia, on 200q4 FWF, increase today to 300q4 -BG well controlled, can d/c checks -s/p PEG, on TF &  FWF  Heme/ID -trach aspirate growing S aureus, on day 2/7 of vanc, Tmax 38.0 in past 24h -macrocytosis, will start MVI  PPx/Dispo -PT/OT rec CIR, f/u PM&R recs -SCDs/TEDs, SQH  Bj Morlock A Verdell Dykman  10/21/19 6:54 AM

## 2019-10-21 NOTE — Plan of Care (Signed)

## 2019-10-21 NOTE — Progress Notes (Signed)
Inpatient Rehab Admissions Coordinator:   Met with pt at bedside to attempt to discuss recommendations for 24/7 supervision/assist at discharge from CIR.  Explained that pt's brother and neighbor are unable to provide this for him and asked if there was anyone else he could think of.  He had a very difficult time communicated (with PMSV or with writing), but it appears he isn't sure at this time.  I did discuss with Dr. Naaman Plummer and if we are unable to confirm adequate support at discharge we will have to sign off for CIR.  CM aware.  Will continue to follow for now.   Shann Medal, PT, DPT Admissions Coordinator [phone] 10/21/19  3:13 PM

## 2019-10-21 NOTE — Progress Notes (Signed)
NAME:  Casey Richard, MRN:  [id number], DOB:  1956-06-07, LOS: 24 ADMISSION DATE:  09/27/2019, CONSULTATION DATE:  10/13/2019 REFERRING MD:  Dr. Zada Finders, CHIEF COMPLAINT:  Acute Hypoxic Respiratory Distress   History of present illness   64 year old male presents to ED on 4/9 with gait instability, memory loss, and falls. CT Head with hypodense lesion with mass effect on pons, right cerebellum. Treated with steroids. 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 On 4/22 patient with lethargy. Taken for CT head which revealed post-operative hemorrhage with brainstem compression. Taken to OR for evacuation. 4/24 patient with acute hypoxic respiratory distress. CXR with bibasilar opacities with right greater then left, concerning for aspiration. PCCM consulted.   Past Medical History   Past Medical History:  Diagnosis Date  . Cardiac arrest ([place])   . CHB (complete heart block) ([place])   . 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 Dysrhythmia 1/13   bradycardia, VF, cardiac arrest/complete heart block with pacer inserted/LOV Dr Sallyanne Kuster 07/21/11 with interrogation and anesthesia guideline order on chart. Chest x ray 1/13 EPIC,, TEE and [facility]  1/13 EPIC  . Memory loss, short term    from fall with heart block 1/13  . Nonsustained ventricular tachycardia ([place]) 02/07/2014  . Presence of permanent cardiac pacemaker 07/15/2011   [employer]tronic Revo  . Seasonal allergies   . Shortness of breath      Significant Hospital Events   4/9 > Admit 4/19 > Resection  4/22 > Evacuation of hemorrhage  4/25 > PCCM Consulted  4/27 > Trached   Significant Diagnostic Tests:  CT Head 4/09 >  1. No acute intracranial or calvarial findings. 2. 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 This most likely represents an arachnoid cyst or epidermoid, and could be  symptomatic based on its mass effect. Cystic neoplasm less likely. Recommend further evaluation with MRI of the brain without and with contrast (if possible; the patient has a pacemaker) and non emergent neuro surgical consultation. 3. No evidence of acute cervical spine fracture, traumatic subluxation or static signs of instability. 4. Mildly progressive cervical spondylosis   MR Brain 4/12 >  1. 4.7 x 3.4 x 4.1 cm multi-cystic right cerebellopontine angle mass with associated abnormal enhancement extending into the right internal auditory canal. 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 Significant associated mass effect upon the right cerebellopontine and cerebellomedullary angle with partial effacement of the fourth ventricle and mild adjacent parenchymal edema. No hydrocephalus. 2. A 17 x 14 mm hypoenhancing mass within the left aspect of the sella has increased in size as compared MRI 02/15/2012. 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 Correlate with relevant laboratory values. 3. Mild chronic small vessel ischemic, progressed as compared to 02/15/2012. 4. Mild ethmoid sinus mucosal thickening. Small bilateral maxillary sinus mucous retention cysts.  CT Head 4/22 >  Right occipital craniectomy and cranioplasty related 2 debulking surgery for cystic mass of the right CP angle region. Gross measurements of the lesion today are 3.2 x 5.3 x 4.5 cm. Internal hemorrhage within the residual cystic lesion. Mass-effect upon the fourth ventricle with subsequent enlargement of the lateral and third ventricles consistent with mild hydrocephalus. Small amount of blood layering dependently in the occipital horns. Small amount of subdural blood along the  inferior tentorium as well as dissecting inferiorly to and through the foramen magnum to the C1 level with mild mass effect upon the brainstem.    CXR 4//24 > 1. Bibasilar opacities, right greater than left may represent aspiration or pneumonia. There may be associated effusion on the right. Recommend clinical correlation and short-term follow-up imaging to ensure resolution. 2. No other acute abnormalities.  Head CT 4/26 >: 1. Interval improvement in hemorrhage in the tumor bed right cerebellar pontine angle . 2. Mild ventricular hemorrhage and mild hydrocephalus unchanged. Subarachnoid hemorrhage bilaterally appears unchanged on the right but improved on the left. No new hemorrhage.  Micro Data:  Sputum 4/25 > negative  Blood 4/25 > negative  Urine culture 4/9 > Staph Epidermis > sensitive to cipro or bactrium,  5/1 resp: mod MSSA 5/1 blood: ngtd 5/1 urine: neg  Antimicrobials:  Zosyn 4/25 >> 4/30 vanc 5/2  Interim history/subjective:  5/3: tolerated PMV 5/2, tmax 100.4. could de-escalated abx based on sensitivities. Remains on trach collar. Will cut trach sutures today.   Objective   Blood pressure (!) 151/74, pulse (!) 105, temperature 98.2 F (36.8 C), temperature source Axillary, resp. rate 19, height 5\' 10"  (1.778 m), weight 62.5 kg, SpO2 95 %.    FiO2 (%):  [28 %] 28 %   Intake/Output Summary (Last 24 hours) at 10/21/2019 1322 Last data filed at 10/21/2019 1121 Gross per 24 hour  Intake 2340 ml  Output 2400 ml  Net -60 ml   Filed Weights   10/19/19 0500 10/20/19 0500 10/21/19 0500  Weight: 63.8 kg 61 kg 62.5 kg    Examination: General: Chronically ill appearing thin elderly male on ATC, in NAD HEENT: [place]/AT, Trach midline, #6 Shiley cuffed,  MM pink/moist, PERRL, right eye with mild ptosis and redness Neuro: Alert and oriented, able to communicate needs, abel to move all extremities, non-focal  CV: s1s2 regular rate and rhythm, no murmur, rubs, or gallops,  PULM:  Clear to ascultation bilaterally, no increased work of breathing, tolerating ATC well GI: soft, bowel sounds active in all 4 quadrants,  non-tender, non-distended, tolerating TF Extremities/skin: warm/dry, area of dried blisters to lateral left thigh see image below, no edema     Assessment & Plan:   Critically ill due to Acute Hypoxic Respiratory Failure presumed secondary to aspiration PNA  - failed HFNC due to inability to control secretions. Location of tumor/bleed likely affecting pharyngeal function.  -Trached 4/27 -S/P 5 days of Zosyn  P: Routine trach care  Pulmonary hygiene  PMV trails as tolerated  Wean FiO2 for sats greater than 90%. Head of bed elevated 30 degrees. Mobilization as able -ok for suture removal today.  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  -Underwent PEG placement 4/30 P: TF for nutritional supplementation  Protein supplementation SLP eval  Appreciated (remaining NPO)  Hypernatremia:  -improving with fwf  H/O Heart Block V.Fib Arrest 2013 s/p Pacemaker P: Continuous telemetry Enteral BP management   Agitated delirium P: Continue HS Seroquel   Best practice:  Diet: TF Pain/Anxiety/Delirium protocol (if indicated): PRNs VAP protocol (if indicated): In  place  DVT prophylaxis: Heparin SQ GI prophylaxis: PPI Glucose control: Monitor  Mobility: Up as tolerated  Code Status: Full Family Communication: Per primary  Disposition: progressive  Labs   CBC: Recent Labs  Lab 10/16/19 0421 10/17/19 0254 10/19/19 0239 10/20/19 1125 10/21/19 0636  WBC 10.2 13.6* 13.0* 13.8* 12.6*  HGB 10.3* 11.3* 11.4* 11.2* 10.3*  HCT 31.6*  34.8* 35.2* 35.9* 32.8*  MCV 101.3* 98.9 100.0 104.1* 103.5*  PLT 164 186 163 146* [id number]    Basic Metabolic Panel: Recent Labs  Lab 10/15/19 0635 10/15/19 0635 10/16/19 0421 10/17/19 0254 10/19/19 0239 10/20/19 1125 10/21/19 0636  NA 147*   < > 147* 147* 151* 152* 147*  K 3.9   < > 3.9 4.2 3.7 4.0 3.5  CL 120*   < > 116* 113* 116* 114* 112*  CO2 23   < > 19* 24 24 26 27   GLUCOSE 105*   < > 138* 108* 145* 117* 110*  BUN 34*   < > 32* 27* 34* 27* 23  CREATININE 0.66   < > 0.68 0.59* 0.92 0.68 0.71  CALCIUM 7.7*   < > 7.8* 8.1* 8.3* 8.4* 8.1*  MG 2.0  --  2.0 2.1  --   --   --   PHOS 3.0  --  2.9 3.2  --   --   --    < > = values in this interval not displayed.   GFR: Estimated Creatinine Clearance: 81.4 mL/min (by C-G formula based on SCr of 0.71 mg/dL). Recent Labs  Lab 10/17/19 0254 10/19/19 0239 10/20/19 1125 10/21/19 0636  WBC 13.6* 13.0* 13.8* 12.6*    Liver Function Tests: Recent Labs  Lab 10/19/19 0239  AST 36  ALT 64*  ALKPHOS 69  BILITOT 0.9  PROT 5.8*  ALBUMIN 2.4*   No results for input(s): LIPASE, AMYLASE in the last 168 hours. No results for input(s): AMMONIA in the last 168 hours.  ABG    Component Value Date/Time   PHART 7.424 10/14/2019 0930   PCO2ART 35.5 10/14/2019 0930   PO2ART 102 10/14/2019 0930   HCO3 23.0 10/14/2019 0930   TCO2 24 10/14/2019 0930   ACIDBASEDEF 1.0 10/14/2019 0930   O2SAT 98.0 10/14/2019 0930     Coagulation Profile: No results for input(s): INR, PROTIME in the last 168 hours.  Cardiac Enzymes: No results for input(s): CKTOTAL, CKMB, CKMBINDEX, TROPONINI in the last 168 hours.  HbA1C: No results found for: HGBA1C  CBG: Recent Labs  Lab 10/20/19 2003 10/20/19 2358 10/21/19 0411 10/21/19 0723 10/21/19 1118  GLUCAP 100* 129* 136* 93 142*       care time 20 mins. This represents my time independent of the NPs time taking care of the pt. This is excluding procedures.    Audria Nine DO [facility] 10/21/2019, 1:23 PM

## 2019-10-21 NOTE — Progress Notes (Signed)
Physical Therapy Treatment Patient Details Name: Casey Richard MRN: [id number] DOB: 1955/11/03 Today's Date: 10/21/2019    History of Present Illness 64 y/o male admitted secondary to dizziness, weight loss, and gait instability. 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  MRI confirmed 2 large brain masses. S/p right crani for tumor excision on 10/07/19. PMH includes CKD and complete heart block s/p pacemaker. Neuro status changes s/p evacuation of post operative hematoma on 10/10/19. Patient then with acute hypoxic respiratory distress. CXR with bibasilar opacities with right greater then left, concerning for aspiration, re-intubated 4/26, s/p tracheostomy 4/27.  Trach collar on 4/29.  PEG tube 10/18/19.    PT Comments    Pt was fearful today of attempting to stand, writing, " I cannot" on a piece of paper while sitting up EOB.  I attempted PMV to increase effective communication, but pt became anxious reporting, "I can't get air out".  His cuff was fully deflated, however, he did cough up a thick, large amount of mucus a few minutes later in sitting making me wonder if this was making getting air out difficult (if it was stuck in his upper airway).  He did not remember working with me on Friday.  He remains very appropriate for CIR level therapies at discharge.  PT will continue to follow acutely for safe mobility progression.  Re-try standing frame (either sara plus or stedy) next session.    Follow Up Recommendations  CIR     Equipment Recommendations  Rolling walker with 5" wheels;3in1 (PT);Hospital bed;Wheelchair (measurements PT);Wheelchair cushion (measurements PT)    Recommendations for Other Services Rehab consult     Precautions / Restrictions Precautions Precautions: Fall Precaution Comments: leans to the right and posteriorly    Mobility  Bed Mobility Overal bed mobility: Needs Assistance Bed Mobility:  Supine to Sit;Sit to Supine Rolling: Min assist   Supine to sit: Mod assist;HOB elevated Sit to supine: Mod assist;HOB elevated   General bed mobility comments: Mod assist to come to sitting EOB with HOB fully elevated, cues for sequencing of how to get up to the side of the bed with hand over hand assist to find railing on his right and cues for sequencing his LEs over first.  Mod assist to lift both legs back into bed.   Transfers                 General transfer comment: Pt did not want to attempt standing today writing on a piece of paper "I cannot"         Balance Overall balance assessment: Needs assistance Sitting-balance support: Feet supported;Bilateral upper extremity supported Sitting balance-Leahy Scale: Poor Sitting balance - Comments: need min assist to maintain sitting balance once feet positioned on floor and with bil UE prop.  Postural control: Right lateral lean                                  Cognition Arousal/Alertness: Awake/alert Behavior During Therapy: Flat affect;Anxious Overall Cognitive Status: Impaired/Different from baseline Area of Impairment: Attention;Following commands;Memory;Safety/judgement;Awareness;Problem solving                 Orientation Level: Time Current Attention Level: Sustained Memory: Decreased short-term memory Following Commands: Follows one step commands with increased time Safety/Judgement: Decreased awareness of safety;Decreased awareness of deficits Awareness: Intellectual Problem Solving: Difficulty sequencing;Requires verbal cues;Requires tactile  cues;Decreased initiation;Slow processing General Comments: Pt did not remember working with me on standing and getting up to the chair on Friday.  He is becoming more fearful and anxious (increased anxiety of PMV, increased anxiety of mobility) and needed more encouragement to participate than prior sessions.           General Comments General  comments (skin integrity, edema, etc.): Pt's O2 dipped to 88% on 28% TC 5L, HR into the 120s from the low 100s, RR increased to high 30s with PMV donned (removed due to pt reporting he felt like he could not get air out--cuff was fully deflated, but he did cough up some very thick mucus later in the session, so this could have been partially blocking his upper airway).        Pertinent Vitals/Pain Pain Assessment: Faces Faces Pain Scale: Hurts little more Pain Location: head Pain Descriptors / Indicators: Grimacing Pain Intervention(s): Limited activity within patient's tolerance;Monitored during session;Repositioned           PT Goals (current goals can now be found in the care plan section) Acute Rehab PT Goals Patient Stated Goal: pt fearful of getting up today Progress towards PT goals: Not progressing toward goals - comment(pt is getting weaker and more fearful of mobility)    Frequency    Min 3X/week      PT Plan Current plan remains appropriate       AM-PAC PT "6 Clicks" Mobility   Outcome Measure  Help needed turning from your back to your side while in a flat bed without using bedrails?: A Lot Help needed moving from lying on your back to sitting on the side of a flat bed without using bedrails?: A Lot Help needed moving to and from a bed to a chair (including a wheelchair)?: A Lot Help needed standing up from a chair using your arms (e.g., wheelchair or bedside chair)?: Total Help needed to walk in hospital room?: Total Help needed climbing 3-5 steps with a railing? : Total 6 Click Score: 9    End of Session Equipment Utilized During Treatment: Oxygen Activity Tolerance: Other (comment)(limited by anxiety today) Patient left: in bed;with call bell/phone within reach;with bed alarm set   PT Visit Diagnosis: Muscle weakness (generalized) (M62.81);Difficulty in walking, not elsewhere classified (R26.2);Dizziness and giddiness (R42);Pain Pain - part of body:  (head)     Time: [id number] PT Time Calculation (min) (ACUTE ONLY): 28 min  Charges:  $Therapeutic Activity: 23-37 mins                    Verdene Lennert, PT, DPT  Acute Rehabilitation [phone] pager #[phone] office     10/21/2019, 12:41 PM

## 2019-10-22 LAB — BASIC METABOLIC PANEL
Anion gap: 7 (ref 5–15)
BUN: 23 mg/dL (ref 8–23)
CO2: 28 mmol/L (ref 22–32)
Calcium: 8 mg/dL — ABNORMAL LOW (ref 8.9–10.3)
Chloride: 108 mmol/L (ref 98–111)
Creatinine, Ser: 0.66 mg/dL (ref 0.61–1.24)
GFR calc Af Amer: >60 mL/min (ref >60)
GFR calc non Af Amer: >60 mL/min (ref >60)
Glucose, Bld: 107 mg/dL — ABNORMAL HIGH (ref 70–99)
Potassium: 3.8 mmol/L (ref 3.5–5.1)
Sodium: 143 mmol/L (ref 135–145)

## 2019-10-22 LAB — GLUCOSE, CAPILLARY
Glucose-Capillary: 115 mg/dL — ABNORMAL HIGH (ref 70–99)
Glucose-Capillary: 116 mg/dL — ABNORMAL HIGH (ref 70–99)
Glucose-Capillary: 120 mg/dL — ABNORMAL HIGH (ref 70–99)
Glucose-Capillary: 122 mg/dL — ABNORMAL HIGH (ref 70–99)
Glucose-Capillary: 125 mg/dL — ABNORMAL HIGH (ref 70–99)
Glucose-Capillary: 128 mg/dL — ABNORMAL HIGH (ref 70–99)
Glucose-Capillary: 129 mg/dL — ABNORMAL HIGH (ref 70–99)

## 2019-10-22 LAB — CBC
HCT: 29.5 % — ABNORMAL LOW (ref 39.0–52.0)
Hemoglobin: 9.5 g/dL — ABNORMAL LOW (ref 13.0–17.0)
MCH: 32.8 pg (ref 26.0–34.0)
MCHC: 32.2 g/dL (ref 30.0–36.0)
MCV: 101.7 fL — ABNORMAL HIGH (ref 80.0–100.0)
Platelets: 204 10*3/uL (ref 150–400)
RBC: 2.9 MIL/uL — ABNORMAL LOW (ref 4.22–5.81)
RDW: 13.2 % (ref 11.5–15.5)
WBC: 13.3 10*3/uL — ABNORMAL HIGH (ref 4.0–10.5)
nRBC: 0 % (ref 0.0–0.2)

## 2019-10-22 MED ORDER — SODIUM CHLORIDE 0.9 % IV SOLN
2.0000 g | INTRAVENOUS | Status: AC
  Administered 2019-10-22 – 2019-10-26 (×24): 2 g via INTRAVENOUS
  Filled 2019-10-22 (×25): qty 2000

## 2019-10-22 MED ORDER — NAFCILLIN SODIUM 2 G IJ SOLR
2.0000 g | INTRAMUSCULAR | Status: DC
  Filled 2019-10-22 (×8): qty 2000

## 2019-10-22 MED ORDER — NAFCILLIN SODIUM 2 G IJ SOLR: 2.0000 g | INTRAMUSCULAR | Status: DC

## 2019-10-22 NOTE — Progress Notes (Signed)
Neurosurgery Service Progress Note  Subjective: No complaints, no issues overnight  Objective: Vitals:   10/22/19 0100 10/22/19 0317 10/22/19 0405 10/22/19 0745  BP: 123/79   122/74  Pulse:    90  Resp:    17  Temp: 98.8 F (37.1 C) 97.9 F (36.6 C)  98.8 F (37.1 C)  TempSrc: Oral Oral  Axillary  SpO2:   97% 98%  Weight:      Height:       Temp (24hrs), Avg:98.5 F (36.9 C), Min:97.9 F (36.6 C), Max:99.4 F (37.4 C)  CBC Latest Ref Rng & Units 10/22/2019 10/21/2019 10/20/2019  WBC 4.0 - 10.5 K/uL 13.3(H) 12.6(H) 13.8(H)  Hemoglobin 13.0 - 17.0 g/dL 9.5(L) 10.3(L) 11.2(L)  Hematocrit 39.0 - 52.0 % 29.5(L) 32.8(L) 35.9(L)  Platelets 150 - 400 K/uL 204 183 146(L)   BMP Latest Ref Rng & Units 10/22/2019 10/21/2019 10/20/2019  Glucose 70 - 99 mg/dL 107(H) 110(H) 117(H)  BUN 8 - 23 mg/dL 23 23 27(H)  Creatinine 0.61 - 1.24 mg/dL 0.66 0.71 0.68  Sodium 135 - 145 mmol/L 143 147(H) 152(H)  Potassium 3.5 - 5.1 mmol/L 3.8 3.5 4.0  Chloride 98 - 111 mmol/L 108 112(H) 114(H)  CO2 22 - 32 mmol/L 28 27 26   Calcium 8.9 - 10.3 mg/dL 8.0(L) 8.1(L) 8.4(L)    Intake/Output Summary (Last 24 hours) at 10/22/2019 0758 Last data filed at 10/22/2019 0553 Gross per 24 hour  Intake 840 ml  Output 2500 ml  Net -1660 ml    Current Facility-Administered Medications:  .  0.9 %  sodium chloride infusion, , Intravenous, PRN, Judith Part, MD, Last Rate: 10 mL/hr at 10/20/19 0600, Rate Verify at 10/20/19 0600 .  acetaminophen (TYLENOL) 160 MG/5ML solution 650 mg, 650 mg, Per Tube, Q4H PRN, Ashok Pall, MD, 650 mg at 10/20/19 1311 .  [DISCONTINUED] acetaminophen (TYLENOL) tablet 650 mg, 650 mg, Oral, Q4H PRN **OR** acetaminophen (TYLENOL) suppository 650 mg, 650 mg, Rectal, Q4H PRN, Judith Part, MD .  amLODipine (NORVASC) tablet 10 mg, 10 mg, Per Tube, Daily, Agarwala, Ravi, MD, 10 mg at 10/21/19 0945 .  bethanechol (URECHOLINE) tablet 10 mg, 10 mg, Per Tube, TID, Kipp Brood, MD, 10 mg at  10/21/19 2106 .  chlorhexidine (PERIDEX) 0.12 % solution 15 mL, 15 mL, Mouth Rinse, BID, Quinteria Chisum, Joyice Faster, MD, 15 mL at 10/21/19 0945 .  Chlorhexidine Gluconate Cloth 2 % PADS 6 each, 6 each, Topical, Daily, Judith Part, MD, 6 each at 10/21/19 [phone]  docusate (COLACE) 50 MG/5ML liquid 100 mg, 100 mg, Per Tube, Daily, Merlene Laughter F, NP, 100 mg at 10/21/19 0945 .  erythromycin ophthalmic ointment, , Right Eye, BID, Jalene Mullet, MD .  feeding supplement (OSMOLITE 1.2 CAL) liquid 1,000 mL, 1,000 mL, Per Tube, Continuous, Judith Part, MD, Last Rate: 70 mL/hr at 10/22/19 0519, 1,000 mL at 10/22/19 0519 .  feeding supplement (PRO-STAT SUGAR FREE 64) liquid 30 mL, 30 mL, Per Tube, Daily, Kensy Blizard A, MD, 30 mL at 10/21/19 0945 .  free water 300 mL, 300 mL, Per Tube, Q4H, Dannica Bickham A, MD, 300 mL at 10/22/19 0525 .  heparin injection 5,000 Units, 5,000 Units, Subcutaneous, Q8H, Agarwala, Einar Grad, MD, 5,000 Units at 10/22/19 0521 .  HYDROcodone-acetaminophen (NORCO/VICODIN) 5-325 MG per tablet 1 tablet, 1 tablet, Per Tube, Q4H PRN, Ashok Pall, MD, 1 tablet at 10/19/19 2118 .  HYDROmorphone (DILAUDID) injection 0.5 mg, 0.5 mg, Intravenous, Q3H PRN, Judith Part, MD,  0.5 mg at 10/19/19 0931 .  ipratropium-albuterol (DUONEB) 0.5-2.5 (3) MG/3ML nebulizer solution 3 mL, 3 mL, Nebulization, BID, Candee Furbish, MD, 3 mL at 10/21/19 2010 .  labetalol (NORMODYNE) injection 10-40 mg, 10-40 mg, Intravenous, Q10 min PRN, Judith Part, MD, 20 mg at 10/18/19 1404 .  lisinopril (ZESTRIL) tablet 5 mg, 5 mg, Per Tube, Daily, Jakiya Bookbinder, Joyice Faster, MD, 5 mg at 10/21/19 0945 .  MEDLINE mouth rinse, 15 mL, Mouth Rinse, q12n4p, Ayomide Purdy, Joyice Faster, MD, 15 mL at 10/21/19 1646 .  multivitamin liquid 15 mL, 15 mL, Per Tube, Daily, Ignace Mandigo, Joyice Faster, MD, 15 mL at 10/21/19 1310 .  ondansetron (ZOFRAN) tablet 4 mg, 4 mg, Oral, Q4H PRN **OR** ondansetron (ZOFRAN) injection 4 mg, 4  mg, Intravenous, Q4H PRN, Maisley Hainsworth A, MD .  pantoprazole sodium (PROTONIX) 40 mg/20 mL oral suspension 40 mg, 40 mg, Per Tube, Daily, Judith Part, MD, 40 mg at 10/21/19 0945 .  promethazine (PHENERGAN) tablet 12.5-25 mg, 12.5-25 mg, Per Tube, Q4H PRN, Judith Part, MD .  QUEtiapine (SEROQUEL) tablet 50 mg, 50 mg, Per Tube, QHS, Judith Part, MD, 50 mg at 10/21/19 2106 .  vancomycin (VANCOREADY) IVPB 1500 mg/300 mL, 1,500 mg, Intravenous, Q24H, Henri Medal, RPH, Last Rate: 150 mL/hr at 10/21/19 1309, 1,500 mg at 10/21/19 1309   Physical Exam: On TC, FCx4 and interactive, eyes open, +R injection / corneal edema improving, stable R cranial nerve palsies  Assessment & Plan: 64 y.o. 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 MRI with good debulking of tumor, pre-post op measurements show decrease in tumor volume from 19.7cc-->4.3cc. 4/22 progressive obtundation, CTH with post-op resection cavity hemorrhage not present on MRI, brainstem compression and developing obstructive hydrocephalus, taken back to OR for hematoma removal and hemostasis. Unfortunately, as expected, post-op cranial nerve dysfunction after hematoma evacuation despite normal intra-op stimulation. Post-op CTH with improved mass effect on brainstem, slowly improving ventricular size with some IVH, 4/26 CTH stable ventricular size, hemorrhage resolving, 4/26 intubated for aspiration / PNA, 4/27 trach, 4/30 s/p PEG, 5/2 fever, trach aspirate Cx +S auerus, started 7d course of vanc  Neuro -ophtho recs for R eye - d/c maxitrol ointment tid (completed course), start erythromycin ointment day 1/4 then resume lacrilube gtt -final path cystic schwannoma  Cardiopulm -wean O2 as tolerated on TC, working towards speaking valve  FENGI -qd RFP for hypernatremia, on 300q4 FWF, Na improving -BG well controlled, can d/c checks -s/p PEG, on TF & FWF  Heme/ID -trach aspirate growing S aureus, on  day 3/7 of Tx, Tmax 38.0 in past 24h, will change vanc to nafcillin 2q4, Cx MSSA -macrocytosis, on MVI  PPx/Dispo -PT/OT rec CIR, f/u PM&R recs, determining eligibility of potential CIR -SCDs/TEDs, SQH  Judith Part  10/22/19 7:58 AM

## 2019-10-22 NOTE — Progress Notes (Signed)
  Speech Language Pathology Treatment: Dysphagia;Passy Muir Speaking valve  Patient Details Name: Casey Richard MRN: [id number] DOB: [birth year] Today's Date: 10/22/2019 Time: [id number] SLP Time Calculation (min) (ACUTE ONLY): 35 min  Assessment / Plan / Recommendation Clinical Impression  PT seen at bedside for skilled ST intervention targeting goals for PMSV use and po readiness. Pt was awake and alert upon arrival of SLP. Cuff was confirmed deflated and PMSV was placed. Voice quality was clear and strong. Oral care was completed with suction. Following oral care, pt accepted trials of individual ice chips x3. Wet voice quality and cough noted. Pt independently suctioned himself orally to remove material. Pt continued to tolerate PMSV placement for the remainder of the session, and was left in place with tether secured. Pt speech is difficult to understand, due to rapid rate of speech and mild dysarthria. Poor carryover of cues to decrease rate.   Recommend PMSV use during waking hours with intermittent supervision. Recommend continuing NPO status. SLP will continue to follow per plan of care. Pt requesting eye drops - RN informed.    HPI HPI: Pt is a 64 y.o. male admitted with dizziness and gait instability.  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 MRI with good debulking of tumor, pre-post op measurements showed decrease in tumor volume from 19.7cc-->4.3cc. 4/22 presented with progressive obtundation, CTH with post-op resection cavity hemorrhage not present on MRI, brainstem compression and developing obstructive hydrocephalus, taken back to OR 4/22 for hematoma removal and hemostasis. Per surgery notes, unfortunately, as expected, there was post-op cranial nerve dysfunction after hematoma evacuation despite normal intra-op stimulation. Post-op CTH  with improved mass effect on brainstem, slowly improving ventricular size with some IVH. ETT 4/26 Trach 4/27. PEG placed on 4/30. CXR 4/30: Bilateral LOWER lung airspace opacities again noted, slightly improved on left.      SLP Plan  Continue with current plan of care       Recommendations  Diet recommendations: NPO Medication Administration: Via alternative means      Patient may use Passy-Muir Speech Valve: During all waking hours (remove during sleep) PMSV Supervision: Intermittent MD: Please consider changing trach tube to : Smaller size;Cuffless         General recommendations: Rehab consult Oral Care Recommendations: Oral care QID Follow up Recommendations: Inpatient Rehab SLP Visit Diagnosis: Dysphagia, oropharyngeal phase (R13.12) Plan: Continue with current plan of care       GO               Henley Blyth B. Quentin Ore, [facility], [place] Speech Language Pathologist Office: [phone] Pager: [phone]  Shonna Chock 10/22/2019, 4:09 PM

## 2019-10-22 NOTE — Progress Notes (Signed)
Physical Therapy Treatment Patient Details Name: Casey Richard MRN: [id number] DOB: 07/07/1955 Today's Date: 10/22/2019    History of Present Illness 64 y/o male admitted secondary to dizziness, weight loss, and gait instability. 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  MRI confirmed 2 large brain masses. S/p right crani for tumor excision on 10/07/19. PMH includes CKD and complete heart block s/p pacemaker. Neuro status changes s/p evacuation of post operative hematoma on 10/10/19. Patient then with acute hypoxic respiratory distress. CXR with bibasilar opacities with right greater then left, concerning for aspiration, re-intubated 4/26, s/p tracheostomy 4/27.  Trach collar on 4/29.  PEG tube 10/18/19.    PT Comments    Pt sat EOB >15 mins today during PT/OT co session and then became fatigued so we used the stedy standing frame again for safe transfer OOB to chair.  If fresh and not fatigued I do believe he could do it with a RW and two person assist for line management and safety.  He was more engaged and willing to participate today, looking at himself for the first time in the mirror to shave with electric shaver.  VSS on 28% TC with copious, thick secretions inhibiting our use of the PMV.   PT will continue to follow acutely for safe mobility progression.  Follow Up Recommendations  CIR     Equipment Recommendations  Rolling walker with 5" wheels;3in1 (PT);Hospital bed;Wheelchair (measurements PT);Wheelchair cushion (measurements PT)    Recommendations for Other Services Rehab consult     Precautions / Restrictions Precautions Precautions: Fall Precaution Comments: leans to the right     Mobility  Bed Mobility Overal bed mobility: Needs Assistance Bed Mobility: Supine to Sit     Supine to sit: Min assist     General bed mobility comments: pt requires max (A) to progress hips to EOB. pt once eob with LOB  in all directions but mainly posterior R side  Transfers Overall transfer level: Needs assistance   Transfers: Sit to/from Stand Sit to Stand: +2 physical assistance;Min assist         General transfer comment: pt able to power up with (A) due to balance deficits, remained standing for transfer on stedy standing frame with min assist for balance on moving object.          Balance Overall balance assessment: Needs assistance Sitting-balance support: Single extremity supported;Feet supported Sitting balance-Leahy Scale: Poor Sitting balance - Comments: pt unable to sustain static sitting without external (A) of therapist. pt reaching for base board for balance with L UE.  Can be close supervision for minimal amount of time, but up to mod assist with fatigue, falling to the right. Pt EOB >15 mins EOB preforming ADLs and seated balance/tolerance.     Standing balance support: Bilateral upper extremity supported Standing balance-Leahy Scale: Poor Standing balance comment: min assist static standing in the stedy standing frame.                              Cognition Arousal/Alertness: Awake/alert Behavior During Therapy: Flat affect Overall Cognitive Status: Impaired/Different from baseline Area of Impairment: Memory;Attention                   Current Attention Level: Selective Memory: Decreased short-term memory;Decreased recall of precautions         General Comments: no recall of use of stedy  with PT previous sesssion. pt needs redirection to task several times         General Comments General comments (skin integrity, edema, etc.): 28% TC 5L, coughing up secretions everytime PMV placed with force. Pt popping off PMV x4 with secretions as source each time. Pt with max HR 122. Cuff fully deflated.       Pertinent Vitals/Pain Pain Assessment: No/denies pain           PT Goals (current goals can now be found in the care plan section) Acute Rehab PT  Goals Patient Stated Goal: to play on ipad at end of session Progress towards PT goals: Progressing toward goals    Frequency    Min 3X/week      PT Plan Current plan remains appropriate    Co-evaluation PT/OT/SLP Co-Evaluation/Treatment: Yes Reason for Co-Treatment: Complexity of the patient's impairments (multi-system involvement);Necessary to address cognition/behavior during functional activity;For patient/therapist safety;To address functional/ADL transfers PT goals addressed during session: Mobility/safety with mobility;Balance;Strengthening/ROM OT goals addressed during session: ADL's and self-care;Proper use of Adaptive equipment and DME;Strengthening/ROM      AM-PAC PT "6 Clicks" Mobility   Outcome Measure  Help needed turning from your back to your side while in a flat bed without using bedrails?: A Little Help needed moving from lying on your back to sitting on the side of a flat bed without using bedrails?: A Little Help needed moving to and from a bed to a chair (including a wheelchair)?: A Little Help needed standing up from a chair using your arms (e.g., wheelchair or bedside chair)?: A Little Help needed to walk in hospital room?: A Lot Help needed climbing 3-5 steps with a railing? : Total 6 Click Score: 15    End of Session Equipment Utilized During Treatment: Oxygen Activity Tolerance: Patient tolerated treatment well Patient left: in chair;with chair alarm set;with call bell/phone within reach   PT Visit Diagnosis: Muscle weakness (generalized) (M62.81);Difficulty in walking, not elsewhere classified (R26.2);Dizziness and giddiness (R42);Pain     Time: [id number] PT Time Calculation (min) (ACUTE ONLY): 51 min  Charges:  $Therapeutic Activity: 8-22 mins            Verdene Lennert, PT, DPT  Acute Rehabilitation [phone] pager #[phone] office               10/22/2019, 11:03 AM

## 2019-10-22 NOTE — Progress Notes (Addendum)
Inpatient Rehab Admissions Coordinator:   Was able to speak to pt's brother, Earley Favor.  He is planning to call around today to see if 24/7 support can be arranged for pt at discharge from CIR.  Addendum: Earley Favor is amenable to SNF as a backup to CIR if 24/7 support cannot be arranged.   Shann Medal, PT, DPT Admissions Coordinator [phone] 10/22/19  1:02 PM

## 2019-10-22 NOTE — Progress Notes (Signed)
Occupational Therapy Treatment Patient Details Name: Casey Richard MRN: [id number] DOB: [birth year] Today's Date: 10/22/2019    History of present illness 64 y/o male admitted secondary to dizziness, weight loss, and gait instability. 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  MRI confirmed 2 large brain masses. S/p right crani for tumor excision on 10/07/19. PMH includes CKD and complete heart block s/p pacemaker. Neuro status changes s/p evacuation of post operative hematoma on 10/10/19. Patient then with acute hypoxic respiratory distress. CXR with bibasilar opacities with right greater then left, concerning for aspiration, re-intubated 4/26, s/p tracheostomy 4/27.  Trach collar on 4/29.  PEG tube 10/18/19.   OT comments  Pt tolerated EOB for > 15 minutes for grooming task and core activation. Pt with external (A) required throughout session. Pt using L ue at baseboard for balance. Pt demonstrates diplopia and taping occlusion of R len of glasses used during session to resolve. Pt working on IPAD wearing glasses at end of session. Pt OOB to chair with stedy. Recommend total +2 with Stedy (A) from RN staff.   Follow Up Recommendations  CIR    Equipment Recommendations  Other (comment)(DEFER TO cir)    Recommendations for Other Services      Precautions / Restrictions Precautions Precautions: Fall       Mobility Bed Mobility Overal bed mobility: Needs Assistance Bed Mobility: Supine to Sit     Supine to sit: Min assist     General bed mobility comments: pt requires max (A) to progress hips to EOB. pt once eob with LOB in all directions but mainly posterior L side  Transfers Overall transfer level: Needs assistance   Transfers: Sit to/from Stand Sit to Stand: +2 physical assistance;Min assist         General transfer comment: pt able to power up with (A) due to balance deficits    Balance Overall  balance assessment: Needs assistance Sitting-balance support: Single extremity supported;Feet supported Sitting balance-Leahy Scale: Poor Sitting balance - Comments: pt unable to sustain static sitting without external (A) of therapist. pt reaching for base board for balance with L UE                                   ADL either performed or assessed with clinical judgement   ADL Overall ADL's : Needs assistance/impaired Eating/Feeding: NPO   Grooming: Moderate assistance;Sitting Grooming Details (indicate cue type and reason): pt shaving face with electric razor and using IPAD as mirror. pt terminating task prematurely. pt does show therapist how to remove batteries and clean the face of the razor. pt with decr coordination of digits         Upper Body Dressing : Minimal assistance;Bed level Upper Body Dressing Details (indicate cue type and reason): don new gown due to wet gown from IV     Toilet Transfer: +2 for physical assistance;Minimal assistance(stedy)             General ADL Comments: pt static sitting for grooming task for > 15 minutes. pt with LOB in all directions.      Vision   Diplopia Assessment: Present all the time/all directions(tape provided to glasses in room. pt reports resolves diplop) Additional Comments: pt with tape R lens completely to allow L eye to focus on task with therapist. pt reports "better"   Perception  Praxis      Cognition Arousal/Alertness: Awake/alert Behavior During Therapy: Flat affect Overall Cognitive Status: Impaired/Different from baseline Area of Impairment: Memory;Attention                   Current Attention Level: Selective Memory: Decreased short-term memory;Decreased recall of precautions         General Comments: no recall of use of stedy with PT previous sesssion. pt needs redirection to task several times        Exercises     Shoulder Instructions       General Comments 28%  TC 5L, coughing up secretions everytime PMV placed with force. Pt popping off PMV x4 with secretions as source each time. Pt with max HR 122. Cuff fully deflated.     Pertinent Vitals/ Pain       Pain Assessment: No/denies pain  Home Living                                          Prior Functioning/Environment              Frequency  Min 3X/week        Progress Toward Goals  OT Goals(current goals can now be found in the care plan section)  Progress towards OT goals: Progressing toward goals  Acute Rehab OT Goals Patient Stated Goal: to play on ipad at end of session Time For Goal Achievement: 10/30/19 Potential to Achieve Goals: Good ADL Goals Pt Will Perform Grooming: with min assist;standing Pt Will Perform Lower Body Bathing: sit to/from stand;with min guard assist Pt Will Perform Lower Body Dressing: with min guard assist;sit to/from stand Pt Will Transfer to Toilet: with min assist;ambulating;bedside commode Pt Will Perform Toileting - Clothing Manipulation and hygiene: with min assist;sitting/lateral leans;sit to/from stand Additional ADL Goal #1: Pt will demonstrate selective attention during ADLs with Min cues  Plan Discharge plan remains appropriate    Co-evaluation    PT/OT/SLP Co-Evaluation/Treatment: Yes Reason for Co-Treatment: Complexity of the patient's impairments (multi-system involvement);Necessary to address cognition/behavior during functional activity;For patient/therapist safety;To address functional/ADL transfers   OT goals addressed during session: ADL's and self-care;Proper use of Adaptive equipment and DME;Strengthening/ROM      AM-PAC OT "6 Clicks" Daily Activity     Outcome Measure   Help from another person eating meals?: A Lot Help from another person taking care of personal grooming?: A Lot Help from another person toileting, which includes using toliet, bedpan, or urinal?: A Lot Help from another person bathing  (including washing, rinsing, drying)?: A Lot Help from another person to put on and taking off regular upper body clothing?: A Lot Help from another person to put on and taking off regular lower body clothing?: A Lot 6 Click Score: 12    End of Session Equipment Utilized During Treatment: Oxygen  OT Visit Diagnosis: Unsteadiness on feet (R26.81);Repeated falls (R29.6);Muscle weakness (generalized) (M62.81)   Activity Tolerance Patient tolerated treatment well   Patient Left in chair;with call bell/phone within reach;with chair alarm set   Nurse Communication Mobility status;Precautions        Time: [id number] OT Time Calculation (min): 51 min  Charges: OT General Charges $OT Visit: 1 Visit OT Treatments $Self Care/Home Management : 23-37 mins   Brynn, OTR/L  Acute Rehabilitation Services Pager: [phone] Office: [phone] .    Jeri Modena 10/22/2019, 10:20 AM

## 2019-10-23 ENCOUNTER — Other Ambulatory Visit: Payer: Self-pay | Admitting: *Deleted

## 2019-10-23 DIAGNOSIS — I83893 Varicose veins of bilateral lower extremities with other complications: Secondary | ICD-10-CM

## 2019-10-23 LAB — CBC
HCT: 27.9 % — ABNORMAL LOW (ref 39.0–52.0)
Hemoglobin: 9.3 g/dL — ABNORMAL LOW (ref 13.0–17.0)
MCH: 32.7 pg (ref 26.0–34.0)
MCHC: 33.3 g/dL (ref 30.0–36.0)
MCV: 98.2 fL (ref 80.0–100.0)
Platelets: 244 10*3/uL (ref 150–400)
RBC: 2.84 MIL/uL — ABNORMAL LOW (ref 4.22–5.81)
RDW: 13.1 % (ref 11.5–15.5)
WBC: 9.9 10*3/uL (ref 4.0–10.5)
nRBC: 0 % (ref 0.0–0.2)

## 2019-10-23 LAB — BASIC METABOLIC PANEL
Anion gap: 7 (ref 5–15)
BUN: 20 mg/dL (ref 8–23)
CO2: 26 mmol/L (ref 22–32)
Calcium: 7.9 mg/dL — ABNORMAL LOW (ref 8.9–10.3)
Chloride: 105 mmol/L (ref 98–111)
Creatinine, Ser: 0.53 mg/dL — ABNORMAL LOW (ref 0.61–1.24)
GFR calc Af Amer: >60 mL/min (ref >60)
GFR calc non Af Amer: >60 mL/min (ref >60)
Glucose, Bld: 140 mg/dL — ABNORMAL HIGH (ref 70–99)
Potassium: 3.7 mmol/L (ref 3.5–5.1)
Sodium: 138 mmol/L (ref 135–145)

## 2019-10-23 LAB — GLUCOSE, CAPILLARY: Glucose-Capillary: 125 mg/dL — ABNORMAL HIGH (ref 70–99)

## 2019-10-23 MED ORDER — FREE WATER
200.0000 mL | Status: DC
  Administered 2019-10-23 – 2019-11-15 (×136): 200 mL

## 2019-10-23 NOTE — Plan of Care (Signed)
  Problem: Clinical Measurements: Goal: Ability to maintain clinical measurements within normal limits will improve Outcome: Progressing   Problem: Clinical Measurements: Goal: Respiratory complications will improve Outcome: Progressing   Problem: Clinical Measurements: Goal: Cardiovascular complication will be avoided Outcome: Progressing   

## 2019-10-23 NOTE — Progress Notes (Addendum)
Nutrition Follow-up  DOCUMENTATION CODES:   Severe malnutrition in context of chronic illness  INTERVENTION:  Continue Osmolite 1.2 formula via PEG at goal rate of 70 ml/hr.  Continue 30 ml Prostat once daily per tube.   Free water flushes of 200 ml every 4 hours. (MD to adjust as appropriate)  Tube feeding regimen provides 2116 kcal (100% of needs), 108 grams of protein, and 2578 ml water.   NUTRITION DIAGNOSIS:   Severe Malnutrition related to chronic illness as evidenced by severe muscle depletion, severe fat depletion, percent weight loss, energy intake < or equal to 75% for > or equal to 1 month; ongoing  GOAL:   Patient will meet greater than or equal to 90% of their needs; met with TF  MONITOR:   PO intake, Supplement acceptance, Weight trends, Labs  REASON FOR ASSESSMENT:   Consult Enteral/tube feeding initiation and management  ASSESSMENT:   Pt with a PMH significant for complete heart block s/p PPM who presented to the hospital for worsening dizziness, gait instability, and weight loss. MRI revealed 2 large brain masses.  RD working remotely.  4/19 s/p RIGHT CRANIOTOMY TUMOR EXCISION 4/22 s/p repeat crani for evacuation of hematoma after tumor excision  4/23 failed swallow, Cortrak placed; tip gastric  4/26 intubated 4/27 trach 4/30 PEG placed  Pt continues on trach collar and NPO status. Pt has been tolerating his tube feeds well via PEG. Free water flushes ordered by MD. RD to continue with current orders. Labs and medications reviewed.   Diet Order:   Diet Order            Diet NPO time specified  Diet effective midnight              EDUCATION NEEDS:   Not appropriate for education at this time  Skin:  Skin Assessment: Skin Integrity Issues: Skin Integrity Issues:: Incisions Incisions: head  Last BM:  5/4  Height:   Ht Readings from Last 1 Encounters:  10/14/19 5' 10" (1.778 m)    Weight:   Wt Readings from Last 1 Encounters:   10/23/19 64.5 kg   BMI:  Body mass index is 20.4 kg/m.  Estimated Nutritional Needs:   Kcal:  2050-2200  Protein:  100-115 grams  Fluid:  >2L/d   Corrin Parker, MS, RD, LDN RD pager number/after hours weekend pager number on Amion.

## 2019-10-23 NOTE — Progress Notes (Signed)
Physical Therapy Treatment Patient Details Name: Casey Richard MRN: [id number] DOB: [birth year] Today's Date: 10/23/2019    History of Present Illness 64 y/o male admitted secondary to dizziness, weight loss, and gait instability. 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  MRI confirmed 2 large brain masses. S/p right crani for tumor excision on 10/07/19. PMH includes CKD and complete heart block s/p pacemaker. Neuro status changes s/p evacuation of post operative hematoma on 10/10/19. Patient then with acute hypoxic respiratory distress. CXR with bibasilar opacities with right greater then left, concerning for aspiration, re-intubated 4/26, s/p tracheostomy 4/27.  Trach collar on 4/29.  PEG tube 10/18/19.    PT Comments    Pt was able to stand and pivot OOB to the recliner chair with RW today.  He is tolerating the PMV for longer periods of time, but continues to cough it off and have frequent productive sputum that requires external suction.  He is ready to progress to short distance gait with RW and chair to follow next session.  We will need to make a trach attachment for O2 tank. PT will continue to follow acutely for safe mobility progression.   Follow Up Recommendations  CIR     Equipment Recommendations  Rolling walker with 5" wheels;3in1 (PT);Wheelchair (measurements PT);Wheelchair cushion (measurements PT)    Recommendations for Other Services   NA     Precautions / Restrictions Precautions Precautions: Fall;Other (comment) Precaution Comments: leans right, trach/PMV    Mobility  Bed Mobility Overal bed mobility: Needs Assistance Bed Mobility: Supine to Sit     Supine to sit: Min assist;HOB elevated     General bed mobility comments: Heavy min assist to roll to R and then use railing and external physical assist to power up to sitting EOB.  Cues for sequencing and technique and slow to initiate after  cues.   Transfers Overall transfer level: Needs assistance Equipment used: Rolling walker (2 wheeled) Transfers: Sit to/from [facility] Sit to Stand: Min assist;+2 physical assistance;From elevated surface Stand pivot transfers: Min assist;From elevated surface;+2 safety/equipment       General transfer comment: Heavy min assist to come to standing and to transfer with RW and pivotal steps to recliner chair on his right.  Pt quick to move and needs reinforcement to go slow and not rush.  Second person helpful to manage lines and ensure safety during transfer.   Ambulation/Gait             General Gait Details: He should be ready for gait progression next session, will need to make trach O2 tank attachment.           Balance Overall balance assessment: Needs assistance Sitting-balance support: Feet supported;Bilateral upper extremity supported Sitting balance-Leahy Scale: Fair Sitting balance - Comments: close supervision today.  Less right lateral lean than previous sessions.  Does still tend to tip with dynamic movements, but static sitting is supervision.  Postural control: Right lateral lean Standing balance support: Bilateral upper extremity supported Standing balance-Leahy Scale: Poor Standing balance comment: needs support from RW and external support from therapist.                             Cognition Arousal/Alertness: Awake/alert Behavior During Therapy: Flat affect;Impulsive Overall Cognitive Status: Impaired/Different from baseline Area of Impairment: Attention;Memory;Following commands;Awareness;Safety/judgement;Problem solving  Orientation Level: Disoriented to;Person(I have seen him repeatedly and he cannot remember) Current Attention Level: Selective Memory: Decreased recall of precautions;Decreased short-term memory Following Commands: Follows one step commands with increased time Safety/Judgement:  Decreased awareness of safety;Decreased awareness of deficits Awareness: Intellectual Problem Solving: Difficulty sequencing;Requires verbal cues;Requires tactile cues General Comments: Pt continues to not recall working with me despite having seen him the last three days in a row. He did remeber shaving yesterday.        Exercises General Exercises - Lower Extremity Hip Flexion/Marching: AROM;Both;20 reps    General Comments General comments (skin integrity, edema, etc.): Pt on 28% TC today and continues to improve in his tolerance of PMV (longer periods of time).  He still tends to cough it off and has productive sputum frequently requiring external suction.  He is also getting to the point where he can talk around his trach without PMV.       Pertinent Vitals/Pain Pain Assessment: No/denies pain           PT Goals (current goals can now be found in the care plan section) Acute Rehab PT Goals Patient Stated Goal: none stated today Progress towards PT goals: Progressing toward goals    Frequency    Min 3X/week      PT Plan Current plan remains appropriate       AM-PAC PT "6 Clicks" Mobility   Outcome Measure  Help needed turning from your back to your side while in a flat bed without using bedrails?: A Little Help needed moving from lying on your back to sitting on the side of a flat bed without using bedrails?: A Little Help needed moving to and from a bed to a chair (including a wheelchair)?: A Little Help needed standing up from a chair using your arms (e.g., wheelchair or bedside chair)?: A Little Help needed to walk in hospital room?: A Lot Help needed climbing 3-5 steps with a railing? : Total 6 Click Score: 15    End of Session Equipment Utilized During Treatment: Oxygen Activity Tolerance: Patient tolerated treatment well Patient left: in chair;with call bell/phone within reach;with chair alarm set Nurse Communication: Mobility status;Need for lift  equipment(to RN tech) PT Visit Diagnosis: Muscle weakness (generalized) (M62.81);Difficulty in walking, not elsewhere classified (R26.2);Dizziness and giddiness (R42);Pain     Time: 1451-1515 PT Time Calculation (min) (ACUTE ONLY): 24 min  Charges:  $Therapeutic Activity: 23-37 mins                    Verdene Lennert, PT, DPT  Acute Rehabilitation [phone] pager #[phone] office     10/23/2019, 3:49 PM

## 2019-10-23 NOTE — Progress Notes (Signed)
Neurosurgery Service Progress Note  Subjective: No complaints, no issues overnight  Objective: Vitals:   10/23/19 0020 10/23/19 0330 10/23/19 0500 10/23/19 0753  BP:  125/77    Pulse:  88  89  Resp:  20  18  Temp:  98.7 F (37.1 C)    TempSrc:  Oral    SpO2: 99% 99%    Weight:   64.5 kg   Height:       Temp (24hrs), Avg:98.4 F (36.9 C), Min:98.1 F (36.7 C), Max:98.7 F (37.1 C)  CBC Latest Ref Rng & Units 10/23/2019 10/22/2019 10/21/2019  WBC 4.0 - 10.5 K/uL 9.9 13.3(H) 12.6(H)  Hemoglobin 13.0 - 17.0 g/dL 9.3(L) 9.5(L) 10.3(L)  Hematocrit 39.0 - 52.0 % 27.9(L) 29.5(L) 32.8(L)  Platelets 150 - 400 K/uL 244 204 183   BMP Latest Ref Rng & Units 10/23/2019 10/22/2019 10/21/2019  Glucose 70 - 99 mg/dL 140(H) 107(H) 110(H)  BUN 8 - 23 mg/dL 20 23 23   Creatinine 0.61 - 1.24 mg/dL 0.53(L) 0.66 0.71  Sodium 135 - 145 mmol/L 138 143 147(H)  Potassium 3.5 - 5.1 mmol/L 3.7 3.8 3.5  Chloride 98 - 111 mmol/L 105 108 112(H)  CO2 22 - 32 mmol/L 26 28 27   Calcium 8.9 - 10.3 mg/dL 7.9(L) 8.0(L) 8.1(L)    Intake/Output Summary (Last 24 hours) at 10/23/2019 0823 Last data filed at 10/23/2019 0525 Gross per 24 hour  Intake 2729.24 ml  Output 2575 ml  Net 154.24 ml    Current Facility-Administered Medications:  .  0.9 %  sodium chloride infusion, , Intravenous, PRN, Judith Part, MD, Last Rate: 10 mL/hr at 10/22/19 1800, Rate Verify at 10/22/19 1800 .  acetaminophen (TYLENOL) 160 MG/5ML solution 650 mg, 650 mg, Per Tube, Q4H PRN, Ashok Pall, MD, 650 mg at 10/20/19 1311 .  [DISCONTINUED] acetaminophen (TYLENOL) tablet 650 mg, 650 mg, Oral, Q4H PRN **OR** acetaminophen (TYLENOL) suppository 650 mg, 650 mg, Rectal, Q4H PRN, Judith Part, MD .  amLODipine (NORVASC) tablet 10 mg, 10 mg, Per Tube, Daily, Agarwala, Ravi, MD, 10 mg at 10/22/19 0942 .  bethanechol (URECHOLINE) tablet 10 mg, 10 mg, Per Tube, TID, Kipp Brood, MD, 10 mg at 10/22/19 2158 .  chlorhexidine (PERIDEX) 0.12 %  solution 15 mL, 15 mL, Mouth Rinse, BID, Ostergard, Joyice Faster, MD, 15 mL at 10/22/19 2157 .  Chlorhexidine Gluconate Cloth 2 % PADS 6 each, 6 each, Topical, Daily, Judith Part, MD, 6 each at 10/22/19 [phone] .  docusate (COLACE) 50 MG/5ML liquid 100 mg, 100 mg, Per Tube, Daily, Merlene Laughter F, NP, 100 mg at 10/22/19 0942 .  erythromycin ophthalmic ointment, , Right Eye, BID, Jalene Mullet, MD, Given at 10/22/19 2157 .  feeding supplement (OSMOLITE 1.2 CAL) liquid 1,000 mL, 1,000 mL, Per Tube, Continuous, Judith Part, MD, Last Rate: 70 mL/hr at 10/23/19 0525, Rate Verify at 10/23/19 0525 .  feeding supplement (PRO-STAT SUGAR FREE 64) liquid 30 mL, 30 mL, Per Tube, Daily, Judith Part, MD, 30 mL at 10/23/19 0823 .  free water 300 mL, 300 mL, Per Tube, Q4H, Ostergard, Thomas A, MD, 300 mL at 10/23/19 0351 .  heparin injection 5,000 Units, 5,000 Units, Subcutaneous, Q8H, Agarwala, Ravi, MD, 5,000 Units at 10/23/19 0519 .  HYDROcodone-acetaminophen (NORCO/VICODIN) 5-325 MG per tablet 1 tablet, 1 tablet, Per Tube, Q4H PRN, Ashok Pall, MD, 1 tablet at 10/19/19 2118 .  HYDROmorphone (DILAUDID) injection 0.5 mg, 0.5 mg, Intravenous, Q3H PRN, Judith Part, MD, 0.5  mg at 10/19/19 0931 .  ipratropium-albuterol (DUONEB) 0.5-2.5 (3) MG/3ML nebulizer solution 3 mL, 3 mL, Nebulization, BID, Candee Furbish, MD, 3 mL at 10/23/19 0752 .  labetalol (NORMODYNE) injection 10-40 mg, 10-40 mg, Intravenous, Q10 min PRN, Judith Part, MD, 20 mg at 10/18/19 1404 .  lisinopril (ZESTRIL) tablet 5 mg, 5 mg, Per Tube, Daily, Ostergard, Joyice Faster, MD, 5 mg at 10/22/19 0942 .  MEDLINE mouth rinse, 15 mL, Mouth Rinse, q12n4p, Ostergard, Joyice Faster, MD, 15 mL at 10/22/19 1557 .  multivitamin liquid 15 mL, 15 mL, Per Tube, Daily, Judith Part, MD, 15 mL at 10/22/19 0949 .  nafcillin 2 g in sodium chloride 0.9 % 100 mL IVPB, 2 g, Intravenous, Q4H, Ostergard, Thomas A, MD, Last Rate: 200 mL/hr  at 10/23/19 0525, Rate Change at 10/23/19 0525 .  ondansetron (ZOFRAN) tablet 4 mg, 4 mg, Oral, Q4H PRN **OR** ondansetron (ZOFRAN) injection 4 mg, 4 mg, Intravenous, Q4H PRN, Ostergard, Thomas A, MD .  pantoprazole sodium (PROTONIX) 40 mg/20 mL oral suspension 40 mg, 40 mg, Per Tube, Daily, Judith Part, MD, 40 mg at 10/22/19 0945 .  promethazine (PHENERGAN) tablet 12.5-25 mg, 12.5-25 mg, Per Tube, Q4H PRN, Judith Part, MD .  QUEtiapine (SEROQUEL) tablet 50 mg, 50 mg, Per Tube, QHS, Judith Part, MD, 50 mg at 10/22/19 2158   Physical Exam: On TC, FCx4 and interactive, eyes open, +R injection / corneal edema improving, stable R cranial nerve palsies  Assessment & Plan: 64 y.o. 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 MRI with good debulking of tumor, pre-post op measurements show decrease in tumor volume from 19.7cc-->4.3cc. 4/22 progressive obtundation, CTH with post-op resection cavity hemorrhage not present on MRI, brainstem compression and developing obstructive hydrocephalus, taken back to OR for hematoma removal and hemostasis. Unfortunately, as expected, post-op cranial nerve dysfunction after hematoma evacuation despite normal intra-op stimulation. Post-op CTH with improved mass effect on brainstem, slowly improving ventricular size with some IVH, 4/26 CTH stable ventricular size, hemorrhage resolving, 4/26 intubated for aspiration / PNA, 4/27 trach, 4/30 s/p PEG, 5/2 fever, trach aspirate Cx +S auerus, started 7d course of vanc  Neuro -R eye keratopathy / bacterial conjunctivitis - completed maxitrol, on erythromycin ointment day 2/4 then resume lacrilube gtt -final path cystic schwannoma  Cardiopulm -wean O2 as tolerated on TC, working towards speaking valve  FENGI -qd RFP for hypernatremia, on 300q4 FWF, Na corrected, will decrease back to 200q4 fo maintenance -s/p PEG, on TF & FWF  Heme/ID -trach aspirate growing S aureus, on day 4/7 of  Tx, Tmax 37.1 in past 24h, on nafcillin 2q4, Cx MSSA -macrocytosis, on MVI  PPx/Dispo -PT/OT rec CIR, f/u PM&R recs, determining eligibility of potential CIR -SCDs/TEDs, SQH  Judith Part  10/23/19 8:23 AM

## 2019-10-24 ENCOUNTER — Inpatient Hospital Stay (HOSPITAL_COMMUNITY): Admission: RE | Admit: 2019-10-24 | Payer: Medicare Other | Source: Ambulatory Visit

## 2019-10-24 LAB — CULTURE, BLOOD (ROUTINE X 2)
Culture: NO GROWTH
Culture: NO GROWTH
Special Requests: ADEQUATE

## 2019-10-24 LAB — CBC
HCT: 27.2 % — ABNORMAL LOW (ref 39.0–52.0)
Hemoglobin: 8.9 g/dL — ABNORMAL LOW (ref 13.0–17.0)
MCH: 32.6 pg (ref 26.0–34.0)
MCHC: 32.7 g/dL (ref 30.0–36.0)
MCV: 99.6 fL (ref 80.0–100.0)
Platelets: 335 10*3/uL (ref 150–400)
RBC: 2.73 MIL/uL — ABNORMAL LOW (ref 4.22–5.81)
RDW: 13.2 % (ref 11.5–15.5)
WBC: 6.9 10*3/uL (ref 4.0–10.5)
nRBC: 0 % (ref 0.0–0.2)

## 2019-10-24 LAB — BASIC METABOLIC PANEL
Anion gap: 4 — ABNORMAL LOW (ref 5–15)
BUN: 17 mg/dL (ref 8–23)
CO2: 28 mmol/L (ref 22–32)
Calcium: 7.6 mg/dL — ABNORMAL LOW (ref 8.9–10.3)
Chloride: 105 mmol/L (ref 98–111)
Creatinine, Ser: 0.51 mg/dL — ABNORMAL LOW (ref 0.61–1.24)
GFR calc Af Amer: >60 mL/min (ref >60)
GFR calc non Af Amer: >60 mL/min (ref >60)
Glucose, Bld: 115 mg/dL — ABNORMAL HIGH (ref 70–99)
Potassium: 3.6 mmol/L (ref 3.5–5.1)
Sodium: 137 mmol/L (ref 135–145)

## 2019-10-24 NOTE — Progress Notes (Signed)
Neurosurgery Service Progress Note  Subjective: No complaints, no issues overnight  Objective: Vitals:   10/24/19 0400 10/24/19 0500 10/24/19 0743 10/24/19 0825  BP: (!) 109/94  121/86   Pulse:   86 86  Resp: 16  17 16   Temp: 98.6 F (37 C)  98 F (36.7 C)   TempSrc: Oral  Oral   SpO2: 100%  99% 94%  Weight:  63 kg    Height:       Temp (24hrs), Avg:98.5 F (36.9 C), Min:98 F (36.7 C), Max:99.2 F (37.3 C)  CBC Latest Ref Rng & Units 10/24/2019 10/23/2019 10/22/2019  WBC 4.0 - 10.5 K/uL 6.9 9.9 13.3(H)  Hemoglobin 13.0 - 17.0 g/dL 8.9(L) 9.3(L) 9.5(L)  Hematocrit 39.0 - 52.0 % 27.2(L) 27.9(L) 29.5(L)  Platelets 150 - 400 K/uL 335 244 204   BMP Latest Ref Rng & Units 10/24/2019 10/23/2019 10/22/2019  Glucose 70 - 99 mg/dL 115(H) 140(H) 107(H)  BUN 8 - 23 mg/dL 17 20 23   Creatinine 0.61 - 1.24 mg/dL 0.51(L) 0.53(L) 0.66  Sodium 135 - 145 mmol/L 137 138 143  Potassium 3.5 - 5.1 mmol/L 3.6 3.7 3.8  Chloride 98 - 111 mmol/L 105 105 108  CO2 22 - 32 mmol/L 28 26 28   Calcium 8.9 - 10.3 mg/dL 7.6(L) 7.9(L) 8.0(L)    Intake/Output Summary (Last 24 hours) at 10/24/2019 0902 Last data filed at 10/24/2019 0518 Gross per 24 hour  Intake 1291 ml  Output 2200 ml  Net -909 ml    Current Facility-Administered Medications:  .  0.9 %  sodium chloride infusion, , Intravenous, PRN, Judith Part, MD, Last Rate: 10 mL/hr at 10/22/19 1800, Rate Verify at 10/22/19 1800 .  acetaminophen (TYLENOL) 160 MG/5ML solution 650 mg, 650 mg, Per Tube, Q4H PRN, Ashok Pall, MD, 650 mg at 10/20/19 1311 .  [DISCONTINUED] acetaminophen (TYLENOL) tablet 650 mg, 650 mg, Oral, Q4H PRN **OR** acetaminophen (TYLENOL) suppository 650 mg, 650 mg, Rectal, Q4H PRN, Judith Part, MD .  amLODipine (NORVASC) tablet 10 mg, 10 mg, Per Tube, Daily, Agarwala, Ravi, MD, 10 mg at 10/24/19 0857 .  bethanechol (URECHOLINE) tablet 10 mg, 10 mg, Per Tube, TID, Kipp Brood, MD, 10 mg at 10/24/19 0857 .  chlorhexidine  (PERIDEX) 0.12 % solution 15 mL, 15 mL, Mouth Rinse, BID, Constant Mand[place], Joyice Faster, MD, 15 mL at 10/24/19 0857 .  Chlorhexidine Gluconate Cloth 2 % PADS 6 each, 6 each, Topical, Daily, Judith Part, MD, 6 each at 10/24/19 [phone] .  docusate (COLACE) 50 MG/5ML liquid 100 mg, 100 mg, Per Tube, Daily, Merlene Laughter F, NP, 100 mg at 10/24/19 0857 .  erythromycin ophthalmic ointment, , Right Eye, BID, Jalene Mullet, MD, Given at 10/24/19 [phone] .  feeding supplement (OSMOLITE 1.2 CAL) liquid 1,000 mL, 1,000 mL, Per Tube, Continuous, Classie Weng, Joyice Faster, MD, Last Rate: 70 mL/hr at 10/23/19 0846, 1,000 mL at 10/23/19 0846 .  feeding supplement (PRO-STAT SUGAR FREE 64) liquid 30 mL, 30 mL, Per Tube, Daily, Layali Freund, Joyice Faster, MD, 30 mL at 10/24/19 0857 .  free water 200 mL, 200 mL, Per Tube, Q4H, Sharvi Mooneyhan A, MD, 200 mL at 10/24/19 0854 .  heparin injection 5,000 Units, 5,000 Units, Subcutaneous, Q8H, Agarwala, Ravi, MD, 5,000 Units at 10/23/19 2113 .  HYDROcodone-acetaminophen (NORCO/VICODIN) 5-325 MG per tablet 1 tablet, 1 tablet, Per Tube, Q4H PRN, Ashok Pall, MD, 1 tablet at 10/19/19 2118 .  HYDROmorphone (DILAUDID) injection 0.5 mg, 0.5 mg, Intravenous, Q3H PRN, Dariusz Brase,  Joyice Faster, MD, 0.5 mg at 10/19/19 0931 .  ipratropium-albuterol (DUONEB) 0.5-2.5 (3) MG/3ML nebulizer solution 3 mL, 3 mL, Nebulization, BID, Candee Furbish, MD, 3 mL at 10/24/19 0825 .  labetalol (NORMODYNE) injection 10-40 mg, 10-40 mg, Intravenous, Q10 min PRN, Judith Part, MD, 20 mg at 10/18/19 1404 .  lisinopril (ZESTRIL) tablet 5 mg, 5 mg, Per Tube, Daily, Thinh Cuccaro, Joyice Faster, MD, 5 mg at 10/24/19 0857 .  MEDLINE mouth rinse, 15 mL, Mouth Rinse, q12n4p, Palmina Clodfelter, Joyice Faster, MD, 15 mL at 10/23/19 1644 .  multivitamin liquid 15 mL, 15 mL, Per Tube, Daily, Shequita Peplinski A, MD, 15 mL at 10/24/19 0900 .  nafcillin 2 g in sodium chloride 0.9 % 100 mL IVPB, 2 g, Intravenous, Q4H, Hayleigh Bawa A, MD, Last  Rate: 200 mL/hr at 10/24/19 0856, 2 g at 10/24/19 0856 .  ondansetron (ZOFRAN) tablet 4 mg, 4 mg, Oral, Q4H PRN **OR** ondansetron (ZOFRAN) injection 4 mg, 4 mg, Intravenous, Q4H PRN, Nashya Garlington A, MD .  pantoprazole sodium (PROTONIX) 40 mg/20 mL oral suspension 40 mg, 40 mg, Per Tube, Daily, Judith Part, MD, 40 mg at 10/24/19 0857 .  promethazine (PHENERGAN) tablet 12.5-25 mg, 12.5-25 mg, Per Tube, Q4H PRN, Judith Part, MD .  QUEtiapine (SEROQUEL) tablet 50 mg, 50 mg, Per Tube, QHS, Judith Part, MD, 50 mg at 10/23/19 2113   Physical Exam: On TC w/ speaking valve today, awake/interactive, FCx4 and interactive, +R injection / corneal edema improving, stable R cranial nerve palsies  Assessment & Plan: 64 y.o. 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 MRI with good debulking of tumor, pre-post op measurements show decrease in tumor volume from 19.7cc-->4.3cc. 4/22 progressive obtundation, CTH with post-op resection cavity hemorrhage not present on MRI, brainstem compression and developing obstructive hydrocephalus, taken back to OR for hematoma removal and hemostasis. Unfortunately, as expected, post-op cranial nerve dysfunction after hematoma evacuation despite normal intra-op stimulation. Post-op CTH with improved mass effect on brainstem, slowly improving ventricular size with some IVH, 4/26 CTH stable ventricular size, hemorrhage resolving, 4/26 intubated for aspiration / PNA, 4/27 trach, 4/30 s/p PEG, 5/2 fever, trach aspirate Cx +S auerus, started 7d course of vanc  Neuro -R eye keratopathy / bacterial conjunctivitis - completed maxitrol, on erythromycin ointment day 2/4 then resume lacrilube gtt -final path cystic schwannoma  Cardiopulm -wean O2 as tolerated on TC, working towards speaking valve  FENGI -qd RFP for hypernatremia, on 200q4 FWF,  -s/p PEG, on TF & FWF  Heme/ID -trach aspirate growing S aureus, on day 57 of Tx, Tmax 37.3 in  past 24h, on nafcillin 2q4, Cx MSSA -macrocytosis, on MVI  PPx/Dispo -PT/OT rec CIR, f/u PM&R recs, determining eligibility of potential CIR -SCDs/TEDs, SQH  Casey Richard  10/24/19 9:02 AM

## 2019-10-24 NOTE — Progress Notes (Signed)
Inpatient Rehab Admissions Coordinator:   Spoke with pt's brother, Earley Favor, and he has been unable to put together 24/7 support for pt at discharge.  Will need to pursue SNF, and Earley Favor is okay with this plan.  Will let TOC team know.   Shann Medal, PT, DPT Admissions Coordinator [phone] 10/24/19  3:02 PM

## 2019-10-24 NOTE — Progress Notes (Addendum)
Occupational Therapy Treatment Patient Details Name: Casey Richard MRN: [id number] DOB: 1955/12/15 Today's Date: 10/24/2019    History of present illness 64 y/o male admitted secondary to dizziness, weight loss, and gait instability. 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  MRI confirmed 2 large brain masses. S/p right crani for tumor excision on 10/07/19. PMH includes CKD and complete heart block s/p pacemaker. Neuro status changes s/p evacuation of post operative hematoma on 10/10/19. Patient then with acute hypoxic respiratory distress. CXR with bibasilar opacities with right greater then left, concerning for aspiration, re-intubated 4/26, s/p tracheostomy 4/27.  Trach collar on 4/29.  PEG tube 10/18/19.   OT comments  Pt progressing towards established OT goals and continues to present with high motivation to participate in therapy. Pt performing functional mobility with Mod A +2 and use of RW. Pt performing grooming while seated at sink with Min A for balance and cues for sequencing. Continue to encourage use of occlusion glasses at R eye. Continue to recommend dc to CIR and will continue to follow acutely as admitted.   Follow Up Recommendations  CIR    Equipment Recommendations  Other (comment)(DEFER TO cir)    Recommendations for Other Services      Precautions / Restrictions Precautions Precautions: Fall;Other (comment) Precaution Comments: leans right, trach/PMV Restrictions Weight Bearing Restrictions: No       Mobility Bed Mobility Overal bed mobility: Needs Assistance Bed Mobility: Supine to Sit     Supine to sit: Min assist;HOB elevated     General bed mobility comments: Min A to pull trunk into sitting  Transfers Overall transfer level: Needs assistance Equipment used: Rolling walker (2 wheeled) Transfers: Sit to/from [facility] Sit to Stand: Min assist;+2 physical  assistance;From elevated surface         General transfer comment: Min A +2 to power up into standing and maintain balance    Balance Overall balance assessment: Needs assistance Sitting-balance support: Feet supported;Bilateral upper extremity supported Sitting balance-Leahy Scale: Fair   Postural control: Right lateral lean Standing balance support: Bilateral upper extremity supported;During functional activity Standing balance-Leahy Scale: Poor Standing balance comment: needs support from RW and external support from therapist.                            ADL either performed or assessed with clinical judgement   ADL Overall ADL's : Needs assistance/impaired     Grooming: Minimal assistance;Sitting Grooming Details (indicate cue type and reason): Pt able to shave his face at sink while seated. Cues for sequencing. Pt brushing his hair with cues for terminating. Min A for balance             Lower Body Dressing: Moderate assistance;Bed level Lower Body Dressing Details (indicate cue type and reason): Mod A to initate task while at bed level and to control BLEs Toilet Transfer: Minimal assistance;+2 for physical assistance;RW;BSC Toilet Transfer Details (indicate cue type and reason): Min A for balance and power up         Functional mobility during ADLs: Moderate assistance;+2 for physical assistance;+2 for safety/equipment;Rolling walker General ADL Comments: Pt performing grooming and then mobility in hallway. COntinue to present with decreased balance, cognition, and activity tolerance     Vision   Vision Assessment?: Yes Eye Alignment: Within Functional Limits Ocular Range of Motion: Within Functional Limits Convergence: Impaired (comment)(Impaired on right)  Diplopia Assessment: Present all the time/all directions(tape provided to glasses in room. pt reports resolves diplop) Depth Perception: Overshoots   Perception     Praxis      Cognition  Arousal/Alertness: Awake/alert Behavior During Therapy: Impulsive Overall Cognitive Status: Impaired/Different from baseline Area of Impairment: Attention;Memory;Following commands;Awareness;Safety/judgement;Problem solving                   Current Attention Level: Selective Memory: Decreased recall of precautions;Decreased short-term memory Following Commands: Follows one step commands with increased time Safety/Judgement: Decreased awareness of safety;Decreased awareness of deficits Awareness: Intellectual Problem Solving: Difficulty sequencing;Requires verbal cues;Requires tactile cues General Comments: Pt continues to require increased time and cues. During grooming, pt requiring cues for sequencing        Exercises     Shoulder Instructions       General Comments Trach colalr on 28% at 5L.     Pertinent Vitals/ Pain       Pain Assessment: Faces Faces Pain Scale: Hurts little more Pain Location: Back Pain Descriptors / Indicators: Grimacing Pain Intervention(s): Monitored during session;Repositioned  Home Living                                          Prior Functioning/Environment              Frequency  Min 3X/week        Progress Toward Goals  OT Goals(current goals can now be found in the care plan section)  Progress towards OT goals: Progressing toward goals  Acute Rehab OT Goals Patient Stated Goal: none stated today Time For Goal Achievement: 10/30/19 Potential to Achieve Goals: Good ADL Goals Pt Will Perform Grooming: with min assist;standing Pt Will Perform Lower Body Bathing: sit to/from stand;with min guard assist Pt Will Perform Lower Body Dressing: with min guard assist;sit to/from stand Pt Will Transfer to Toilet: with min assist;ambulating;bedside commode Pt Will Perform Toileting - Clothing Manipulation and hygiene: with min assist;sitting/lateral leans;sit to/from stand Additional ADL Goal #1: Pt will  demonstrate selective attention during ADLs with Min cues  Plan Discharge plan remains appropriate    Co-evaluation    PT/OT/SLP Co-Evaluation/Treatment: Yes Reason for Co-Treatment: For patient/therapist safety;To address functional/ADL transfers;Complexity of the patient's impairments (multi-system involvement)   OT goals addressed during session: ADL's and self-care      AM-PAC OT "6 Clicks" Daily Activity     Outcome Measure   Help from another person eating meals?: A Lot Help from another person taking care of personal grooming?: A Lot Help from another person toileting, which includes using toliet, bedpan, or urinal?: A Lot Help from another person bathing (including washing, rinsing, drying)?: A Lot Help from another person to put on and taking off regular upper body clothing?: A Lot Help from another person to put on and taking off regular lower body clothing?: A Lot 6 Click Score: 12    End of Session Equipment Utilized During Treatment: Oxygen  OT Visit Diagnosis: Unsteadiness on feet (R26.81);Repeated falls (R29.6);Muscle weakness (generalized) (M62.81)   Activity Tolerance Patient tolerated treatment well   Patient Left in chair;with call bell/phone within reach;with chair alarm set   Nurse Communication Mobility status;Precautions        Time: [id number] OT Time Calculation (min): 42 min  Charges: OT General Charges $OT Visit: 1 Visit OT Treatments $Self Care/Home Management : 23-37 mins  Nga Rabon  Aubri Gathright MSOT, OTR/L Acute Rehab Pager: [phone] Office: [place] 10/24/2019, 6:31 PM

## 2019-10-24 NOTE — TOC Initial Note (Signed)
Transition of Care [facility]) - Initial/Assessment Note    Patient Details  Name: Casey Richard MRN: [id number] Date of Birth: 03-May-1956  Transition of Care [facility]) CM/SW Contact:    Vinie Sill, [place] Phone Number: 10/24/2019, 4:20 PM  Clinical Narrative:                  CSW spoke with patient's brother,Garth. CSW introduced self and explained role. CSW discussed short term rehab at SNF as back up plan to CIR. Patient's brother was agreeable to SNF as back up plan. CSW explained the SNF process. CSW was given permission to send out SNF referrals.   CSW will continue to follow and assist with discharge planning if needed.  Thurmond Butts, MSW, [place] Clinical Social Worker   Expected Discharge Plan: Skilled Nursing Facility Barriers to Discharge: Ship broker, Continued Medical Work up   Patient Goals and CMS Choice        Expected Discharge Plan and Services Expected Discharge Plan: [place] In-house Referral: Clinical Social Work     Living arrangements for the past 2 months: Single Family Home                                      Prior Living Arrangements/Services Living arrangements for the past 2 months: Single Family Home Lives with:: Self Patient language and need for interpreter reviewed:: No        Need for Family Participation in Patient Care: Yes (Comment) Care giver support system in place?: Yes (comment)   Criminal Activity/Legal Involvement Pertinent to Current Situation/Hospitalization: No - Comment as needed  Activities of Daily Living Home Assistive Devices/Equipment: Eyeglasses(walking stick) ADL Screening (condition at time of admission) Patient's cognitive ability adequate to safely complete daily activities?: Yes Is the patient deaf or have difficulty hearing?: Yes Does the patient have difficulty seeing, even when wearing glasses/contacts?: Yes Does the patient have difficulty concentrating,  remembering, or making decisions?: Yes Patient able to express need for assistance with ADLs?: Yes Does the patient have difficulty dressing or bathing?: Yes Independently performs ADLs?: Yes (appropriate for developmental age) Does the patient have difficulty walking or climbing stairs?: Yes Weakness of Legs: Both Weakness of Arms/Hands: Both  Permission Sought/Granted Permission sought to share information with : Family Supports Permission granted to share information with : Yes, Verbal Permission Granted  Share Information with NAME: Santonio Espina  Permission granted to share info w AGENCY: SNFs  Permission granted to share info w Relationship: brother  Permission granted to share info w Contact Information: [phone]  Emotional Assessment       Orientation: : Oriented to Self, Oriented to Place, Oriented to  Time, Oriented to Situation Alcohol / Substance Use: Not Applicable Psych Involvement: No (comment)  Admission diagnosis:  Brain mass [G93.89] Brain tumor [facility]) [D49.6] Patient Active Problem List   Diagnosis Date Noted  . Hypoxia   . Status post tracheostomy ([place])   . Ventilator dependent ([place])   . Protein-calorie malnutrition, severe 10/10/2019  . Steroid-induced hyperglycemia   . Leukocytosis   . Acute blood loss anemia   . Brain tumor ([place]) 10/07/2019  . Brain mass 09/27/2019  . Gait instability 09/27/2019  . Paroxysmal atrial tachycardia ([place]) 10/27/2015  . Nonsustained ventricular tachycardia ([place]) 02/07/2014  . CHB (complete heart block) ([place])   . Memory loss, short term   . 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  NSTEMI (non-ST elevated myocardial infarction) ([place]) 07/10/2011  . Ventricular fibrillation ([place]) 07/09/2011  . 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 Complete heart block, requiring external pacing then temp wire pacing. 07/09/2011   PCP:  Benito Mccreedy, MD Pharmacy:   [facility] [place],  [address] [place] [address] Phone: [phone] Fax: [phone]     Social Determinants of Health (SDOH) Interventions    Readmission Risk Interventions No flowsheet data found.

## 2019-10-24 NOTE — TOC Progression Note (Signed)
Transition of Care [facility]) - Progression Note    Patient Details  Name: Casey Richard MRN: [id number] Date of Birth: [birth year]  Transition of Care [facility]) CM/SW [place], [place] Phone Number: 10/24/2019, 4:24 PM  Clinical Narrative:     CSW was informed by CIR- family was unable to provide patient  24/7 supervision  required by CIR after discharge and SNF will be needed. CSW spoke with patient's brother Leafy Kindle, he was agreeable to pursing SNF placement and requested CSW e-mail  him SNF bed offers. CSW explained top two choices were needed as soon as possible. Paient's brother states he will review discuss offers with family and friends and inform CSW.  CSW emailed bed offers- waiting on choice from family. CSW started insurance authorization. Patient will need covid before discharged to SNF.  CSW will continue to follow and assist with discharge .  Thurmond Butts, MSW, LCSWA Clinical Social Worker    Expected Discharge Plan: Skilled Nursing Facility Barriers to Discharge: Ship broker, Continued Medical Work up  Expected Discharge Plan and Services Expected Discharge Plan: [place] In-house Referral: Clinical Social Work     Living arrangements for the past 2 months: Single Family Home                                       Social Determinants of Health (SDOH) Interventions    Readmission Risk Interventions No flowsheet data found.

## 2019-10-24 NOTE — Plan of Care (Signed)
  Problem: Elimination: Goal: Will not experience complications related to urinary retention Note: Foley catheter removed per MD order. Catheter ballon deflated. Catheter removed without difficulty; catheter tip intact. External catheter placed. Patient instructed to notify nurse of first void. Patient tolerated well.

## 2019-10-24 NOTE — Progress Notes (Signed)
Physical Therapy Treatment Patient Details Name: Casey Richard MRN: [id number] DOB: 07-01-1955 Today's Date: 10/24/2019    History of Present Illness 64 y/o male admitted secondary to dizziness, weight loss, and gait instability. 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  MRI confirmed 2 large brain masses. S/p right crani for tumor excision on 10/07/19. PMH includes CKD and complete heart block s/p pacemaker. Neuro status changes s/p evacuation of post operative hematoma on 10/10/19. Patient then with acute hypoxic respiratory distress. CXR with bibasilar opacities with right greater then left, concerning for aspiration, re-intubated 4/26, s/p tracheostomy 4/27.  Trach collar on 4/29.  PEG tube 10/18/19.    PT Comments    PT/OT co-session with focus on walking away from the bed.  Ambulated first to the bathroom for ADLs with OT and then down the hallway with one person on lines, one person assisting pt and one person following closely with the recliner chair.  Pt was min assist overall.  He did better during gait with PMV off for more comfortable respirations.  He is highly motivated to regain his strength and independence.  CIR has denied, so changing d/c recs to SNF.     Follow Up Recommendations  SNF     Equipment Recommendations  Rolling walker with 5" wheels;3in1 (PT);Wheelchair (measurements PT);Wheelchair cushion (measurements PT)    Recommendations for Other Services   NA     Precautions / Restrictions Precautions Precautions: Fall;Other (comment) Precaution Comments: trach, PMV, G-tube, cannot hear out of R ear Restrictions Weight Bearing Restrictions: No    Mobility  Bed Mobility Overal bed mobility: Needs Assistance Bed Mobility: Supine to Sit     Supine to sit: Min assist;HOB elevated     General bed mobility comments: Min hand held assist to pull trunk up to sitting EOB.    Transfers Overall  transfer level: Needs assistance Equipment used: Rolling walker (2 wheeled) Transfers: Sit to/from Stand Sit to Stand: Min assist;+2 physical assistance;From elevated surface         General transfer comment: Min A +2 to power up into standing and maintain balance and stabilize RW during transitions.    Ambulation/Gait Ambulation/Gait assistance: Min assist;+2 safety/equipment(three people used) Gait Distance (Feet): 10 Feet(10'x1, sit, 35'x1, sit, 35'x1 sit) Assistive device: Rolling walker (2 wheeled) Gait Pattern/deviations: Step-through pattern;Ataxic;Staggering left Gait velocity: decreased Gait velocity interpretation: <1.31 ft/sec, indicative of household ambulator General Gait Details: Left lateral lean, L leg starts to drag with fatigue and pt has a hard time coordinating movement more on that side than R.  One person on pt, one following with chair and one on lines.           Balance Overall balance assessment: Needs assistance Sitting-balance support: Feet supported;Bilateral upper extremity supported Sitting balance-Leahy Scale: Fair Sitting balance - Comments: close supervision today.  Less right lateral lean than previous sessions.  Does still tend to tip with dynamic movements, but static sitting is supervision.  Postural control: Right lateral lean Standing balance support: Bilateral upper extremity supported Standing balance-Leahy Scale: Poor Standing balance comment: needs support from RW and external support from therapist.                             Cognition Arousal/Alertness: Awake/alert Behavior During Therapy: Impulsive Overall Cognitive Status: Impaired/Different from baseline Area of Impairment: Attention;Memory;Following commands;Awareness;Safety/judgement;Problem solving  Orientation Level: Disoriented to;Person Current Attention Level: Selective Memory: Decreased recall of precautions;Decreased short-term  memory Following Commands: Follows one step commands with increased time Safety/Judgement: Decreased awareness of safety;Decreased awareness of deficits Awareness: Intellectual Problem Solving: Difficulty sequencing;Requires verbal cues;Requires tactile cues General Comments: Pt continues to require increased time and cues. During grooming, pt requiring cues for sequencing         General Comments General comments (skin integrity, edema, etc.): VSS throughout on 28% TC 6L O2 via portable O2 tank.  We used PMV on and off to improve communication, but easier to breathe with it off during gait.       Pertinent Vitals/Pain Pain Assessment: No/denies pain Faces Pain Scale: Hurts little more Pain Location: Back Pain Descriptors / Indicators: Grimacing Pain Intervention(s): Monitored during session;Repositioned           PT Goals (current goals can now be found in the care plan section) Acute Rehab PT Goals Patient Stated Goal: none stated today Progress towards PT goals: Progressing toward goals    Frequency    Min 2X/week      PT Plan Current plan remains appropriate    Co-evaluation PT/OT/SLP Co-Evaluation/Treatment: Yes Reason for Co-Treatment: Complexity of the patient's impairments (multi-system involvement);Necessary to address cognition/behavior during functional activity;For patient/therapist safety;To address functional/ADL transfers PT goals addressed during session: Mobility/safety with mobility;Balance;Proper use of DME;Strengthening/ROM OT goals addressed during session: ADL's and self-care      AM-PAC PT "6 Clicks" Mobility   Outcome Measure  Help needed turning from your back to your side while in a flat bed without using bedrails?: A Little Help needed moving from lying on your back to sitting on the side of a flat bed without using bedrails?: A Little Help needed moving to and from a bed to a chair (including a wheelchair)?: A Little Help needed standing  up from a chair using your arms (e.g., wheelchair or bedside chair)?: A Little Help needed to walk in hospital room?: A Little Help needed climbing 3-5 steps with a railing? : Total 6 Click Score: 16    End of Session Equipment Utilized During Treatment: Gait belt;Oxygen Activity Tolerance: Patient limited by fatigue Patient left: in chair;with call bell/phone within reach;with chair alarm set Nurse Communication: Mobility status PT Visit Diagnosis: Muscle weakness (generalized) (M62.81);Difficulty in walking, not elsewhere classified (R26.2);Dizziness and giddiness (R42);Pain     Time: [id number] PT Time Calculation (min) (ACUTE ONLY): 40 min  Charges:  $Gait Training: 8-22 mins                    Verdene Lennert, PT, DPT  Acute Rehabilitation [phone] pager [phone]) [phone] office

## 2019-10-25 LAB — BASIC METABOLIC PANEL
Anion gap: 8 (ref 5–15)
BUN: 16 mg/dL (ref 8–23)
CO2: 27 mmol/L (ref 22–32)
Calcium: 7.8 mg/dL — ABNORMAL LOW (ref 8.9–10.3)
Chloride: 103 mmol/L (ref 98–111)
Creatinine, Ser: 0.54 mg/dL — ABNORMAL LOW (ref 0.61–1.24)
GFR calc Af Amer: >60 mL/min (ref >60)
GFR calc non Af Amer: >60 mL/min (ref >60)
Glucose, Bld: 134 mg/dL — ABNORMAL HIGH (ref 70–99)
Potassium: 3.8 mmol/L (ref 3.5–5.1)
Sodium: 138 mmol/L (ref 135–145)

## 2019-10-25 LAB — RESPIRATORY PANEL BY RT PCR (FLU A&B, COVID)
Influenza A by PCR: NEGATIVE
Influenza B by PCR: NEGATIVE
SARS Coronavirus 2 by RT PCR: NEGATIVE

## 2019-10-25 LAB — CBC
HCT: 29.6 % — ABNORMAL LOW (ref 39.0–52.0)
Hemoglobin: 9.5 g/dL — ABNORMAL LOW (ref 13.0–17.0)
MCH: 32.4 pg (ref 26.0–34.0)
MCHC: 32.1 g/dL (ref 30.0–36.0)
MCV: 101 fL — ABNORMAL HIGH (ref 80.0–100.0)
Platelets: 420 10*3/uL — ABNORMAL HIGH (ref 150–400)
RBC: 2.93 MIL/uL — ABNORMAL LOW (ref 4.22–5.81)
RDW: 13.2 % (ref 11.5–15.5)
WBC: 6 10*3/uL (ref 4.0–10.5)
nRBC: 0.3 % — ABNORMAL HIGH (ref 0.0–0.2)

## 2019-10-25 NOTE — Progress Notes (Addendum)
Notified by tele that patient had 6 beats of V tach, RN in the room at this time and he is currently resting but is easily arousable. Called neurosurgery and left message.

## 2019-10-25 NOTE — Progress Notes (Signed)
Neurosurgery Service Progress Note  Subjective: No complaints, required straight cath x1 overnight, otherwise no issues  Objective: Vitals:   10/25/19 0338 10/25/19 0602 10/25/19 0733 10/25/19 0815  BP:  125/84 121/84 121/84  Pulse: 85 84 82 84  Resp: 18 15 12 14   Temp:  (!) 97.4 F (36.3 C) 97.6 F (36.4 C)   TempSrc:  Axillary Axillary   SpO2:  100% 99% 100%  Weight:      Height:       Temp (24hrs), Avg:98.2 F (36.8 C), Min:97.4 F (36.3 C), Max:98.6 F (37 C)  CBC Latest Ref Rng & Units 10/25/2019 10/24/2019 10/23/2019  WBC 4.0 - 10.5 K/uL 6.0 6.9 9.9  Hemoglobin 13.0 - 17.0 g/dL 9.5(L) 8.9(L) 9.3(L)  Hematocrit 39.0 - 52.0 % 29.6(L) 27.2(L) 27.9(L)  Platelets 150 - 400 K/uL 420(H) 335 244   BMP Latest Ref Rng & Units 10/25/2019 10/24/2019 10/23/2019  Glucose 70 - 99 mg/dL 134(H) 115(H) 140(H)  BUN 8 - 23 mg/dL 16 17 20   Creatinine 0.61 - 1.24 mg/dL 0.54(L) 0.51(L) 0.53(L)  Sodium 135 - 145 mmol/L 138 137 138  Potassium 3.5 - 5.1 mmol/L 3.8 3.6 3.7  Chloride 98 - 111 mmol/L 103 105 105  CO2 22 - 32 mmol/L 27 28 26   Calcium 8.9 - 10.3 mg/dL 7.8(L) 7.6(L) 7.9(L)    Intake/Output Summary (Last 24 hours) at 10/25/2019 0913 Last data filed at 10/25/2019 0647 Gross per 24 hour  Intake 2445.89 ml  Output 3300 ml  Net -854.11 ml    Current Facility-Administered Medications:  .  0.9 %  sodium chloride infusion, , Intravenous, PRN, Judith Part, MD, Last Rate: 10 mL/hr at 10/25/19 0605, 500 mL at 10/25/19 0605 .  acetaminophen (TYLENOL) 160 MG/5ML solution 650 mg, 650 mg, Per Tube, Q4H PRN, Ashok Pall, MD, 650 mg at 10/20/19 1311 .  [DISCONTINUED] acetaminophen (TYLENOL) tablet 650 mg, 650 mg, Oral, Q4H PRN **OR** acetaminophen (TYLENOL) suppository 650 mg, 650 mg, Rectal, Q4H PRN, Judith Part, MD .  amLODipine (NORVASC) tablet 10 mg, 10 mg, Per Tube, Daily, Agarwala, Ravi, MD, 10 mg at 10/24/19 0857 .  bethanechol (URECHOLINE) tablet 10 mg, 10 mg, Per Tube, TID,  Kipp Brood, MD, 10 mg at 10/24/19 2005 .  chlorhexidine (PERIDEX) 0.12 % solution 15 mL, 15 mL, Mouth Rinse, BID, Leroy Pettway, Joyice Faster, MD, 15 mL at 10/24/19 2005 .  Chlorhexidine Gluconate Cloth 2 % PADS 6 each, 6 each, Topical, Daily, Judith Part, MD, 6 each at 10/24/19 [phone] .  docusate (COLACE) 50 MG/5ML liquid 100 mg, 100 mg, Per Tube, Daily, Merlene Laughter F, NP, 100 mg at 10/24/19 0857 .  erythromycin ophthalmic ointment, , Right Eye, BID, Jalene Mullet, MD, Given at 10/24/19 2005 .  feeding supplement (OSMOLITE 1.2 CAL) liquid 1,000 mL, 1,000 mL, Per Tube, Continuous, Lalani Winkles, Joyice Faster, MD, Last Rate: 70 mL/hr at 10/24/19 1239, 1,000 mL at 10/24/19 1239 .  feeding supplement (PRO-STAT SUGAR FREE 64) liquid 30 mL, 30 mL, Per Tube, Daily, Dwayn Moravek, Joyice Faster, MD, 30 mL at 10/24/19 0857 .  free water 200 mL, 200 mL, Per Tube, Q4H, Kylor Valverde A, MD, 200 mL at 10/25/19 0455 .  heparin injection 5,000 Units, 5,000 Units, Subcutaneous, Q8H, Kipp Brood, MD, 5,000 Units at 10/25/19 [id number] .  HYDROcodone-acetaminophen (NORCO/VICODIN) 5-325 MG per tablet 1 tablet, 1 tablet, Per Tube, Q4H PRN, Ashok Pall, MD, 1 tablet at 10/19/19 2118 .  HYDROmorphone (DILAUDID) injection 0.5 mg, 0.5 mg,  Intravenous, Q3H PRN, Judith Part, MD, 0.5 mg at 10/19/19 0931 .  ipratropium-albuterol (DUONEB) 0.5-2.5 (3) MG/3ML nebulizer solution 3 mL, 3 mL, Nebulization, BID, Candee Furbish, MD, 3 mL at 10/25/19 0815 .  labetalol (NORMODYNE) injection 10-40 mg, 10-40 mg, Intravenous, Q10 min PRN, Judith Part, MD, 20 mg at 10/18/19 1404 .  lisinopril (ZESTRIL) tablet 5 mg, 5 mg, Per Tube, Daily, Amijah Timothy, Joyice Faster, MD, 5 mg at 10/24/19 0857 .  MEDLINE mouth rinse, 15 mL, Mouth Rinse, q12n4p, Jousha Schwandt, Joyice Faster, MD, 15 mL at 10/23/19 1644 .  multivitamin liquid 15 mL, 15 mL, Per Tube, Daily, Dejanee Thibeaux A, MD, 15 mL at 10/24/19 0900 .  nafcillin 2 g in sodium chloride 0.9 % 100 mL  IVPB, 2 g, Intravenous, Q4H, Latifa Noble A, MD, Last Rate: 200 mL/hr at 10/25/19 0454, 2 g at 10/25/19 0454 .  ondansetron (ZOFRAN) tablet 4 mg, 4 mg, Oral, Q4H PRN **OR** ondansetron (ZOFRAN) injection 4 mg, 4 mg, Intravenous, Q4H PRN, Lynsie Mcwatters A, MD .  pantoprazole sodium (PROTONIX) 40 mg/20 mL oral suspension 40 mg, 40 mg, Per Tube, Daily, Judith Part, MD, 40 mg at 10/24/19 0857 .  promethazine (PHENERGAN) tablet 12.5-25 mg, 12.5-25 mg, Per Tube, Q4H PRN, Judith Part, MD .  QUEtiapine (SEROQUEL) tablet 50 mg, 50 mg, Per Tube, QHS, Judith Part, MD, 50 mg at 10/24/19 2005   Physical Exam: On TC, awake/interactive, FCx4, +R injection / corneal edema improving, stable R cranial nerve palsies  Assessment & Plan: 64 y.o. 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 MRI with good debulking of tumor, pre-post op measurements show decrease in tumor volume from 19.7cc-->4.3cc. 4/22 progressive obtundation, CTH with post-op resection cavity hemorrhage not present on MRI, brainstem compression and developing obstructive hydrocephalus, taken back to OR for hematoma removal and hemostasis. Unfortunately, as expected, post-op cranial nerve dysfunction after hematoma evacuation despite normal intra-op stimulation. Post-op CTH with improved mass effect on brainstem, slowly improving ventricular size with some IVH, 4/26 CTH stable ventricular size, hemorrhage resolving, 4/26 intubated for aspiration / PNA, 4/27 trach, 4/30 s/p PEG, 5/2 fever, trach aspirate Cx +S auerus, started 7d course of vanc  Neuro -R eye keratopathy / bacterial conjunctivitis - completed maxitrol, on erythromycin ointment day 3/4 then resume lacrilube gtt -final path cystic schwannoma  Cardiopulm -wean O2 as tolerated on TC, working towards speaking valve  FENGI -qd RFP for hypernatremia, on 200q4 FWF, Na stable -s/p PEG, on TF & FW -retained overnight, on bethanechol already,  okay to replace foley if straight cath required again  Heme/ID -trach aspirate growing S aureus, on day 6/7 of Tx, Tmax 37.0 in past 24h, on nafcillin 2q4, Cx MSSA -macrocytosis, on MVI  PPx/Dispo -SNF discharge pending, COVID test sent -SCDs/TEDs, Jackey Loge  10/25/19 9:13 AM

## 2019-10-25 NOTE — NC FL2 (Signed)
[place] MEDICAID FL2 LEVEL OF CARE SCREENING TOOL     IDENTIFICATION  Patient Name: Casey Richard Birthdate: [birth year] Sex: male Admission Date (Current Location): 09/27/2019  [facility] and [place] Number:  Herbalist and Address:  [facility], [place] [address], [place], [place] [address]      Provider Number: [id number]  Attending Physician Name and Address:  Judith Part, MD  Relative Name and Phone Number:       Current Level of Care: Hospital Recommended Level of Care: [place] Prior Approval Number:    Date Approved/Denied:   PASRR Number: [id number]  Discharge Plan: SNF    Current Diagnoses: Patient Active Problem List   Diagnosis Date Noted  . Hypoxia   . Status post tracheostomy ([place])   . Ventilator dependent ([place])   . Protein-calorie malnutrition, severe 10/10/2019  . Steroid-induced hyperglycemia   . Leukocytosis   . Acute blood loss anemia   . Brain tumor ([place]) 10/07/2019  . Brain mass 09/27/2019  . Gait instability 09/27/2019  . Paroxysmal atrial tachycardia ([place]) 10/27/2015  . Nonsustained ventricular tachycardia ([place]) 02/07/2014  . CHB (complete heart block) ([place])   . Memory loss, short term   . 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 NSTEMI (non-ST elevated myocardial infarction) ([place]) 07/10/2011  . Ventricular fibrillation ([place]) 07/09/2011  . 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 Complete heart block, requiring external pacing then temp wire pacing. 07/09/2011    Orientation RESPIRATION BLADDER Height & Weight     Self, Time, Situation, Place  Tracheostomy Continent Weight: 138 lb 14.2 oz (63 kg) Height:  5\' 10"  (177.8 cm)  BEHAVIORAL SYMPTOMS/MOOD NEUROLOGICAL BOWEL NUTRITION STATUS      Incontinent Feeding tube(Peg tube)  AMBULATORY STATUS COMMUNICATION OF NEEDS Skin     Verbally Surgical wounds(closed incision head)                        Personal Care Assistance Level of Assistance  Bathing, Feeding, Dressing Bathing Assistance: Limited assistance Feeding assistance: Limited assistance Dressing Assistance: Limited assistance     Functional Limitations Info  Sight, Hearing, Speech Sight Info: Adequate Hearing Info: Adequate Speech Info: Adequate(has trach)    SPECIAL CARE FACTORS FREQUENCY  PT (By licensed PT), OT (By licensed OT)     PT Frequency: 3x per week OT Frequency: 3x per week            Contractures Contractures Info: Not present    Additional Factors Info  Code Status, Allergies Code Status Info: FULL Allergies Info: NKA           Current Medications (10/25/2019):  This is the current hospital active medication list Current Facility-Administered Medications  Medication Dose Route Frequency Provider Last Rate Last Admin  . 0.9 %  sodium chloride infusion   Intravenous PRN Judith Part, MD 10 mL/hr at 10/25/19 0605 500 mL at 10/25/19 0605  . acetaminophen (TYLENOL) 160 MG/5ML solution 650 mg  650 mg Per Tube Q4H PRN Ashok Pall, MD   650 mg at 10/20/19 1311  . acetaminophen (TYLENOL) suppository 650 mg  650 mg Rectal Q4H PRN Judith Part, MD      . amLODipine (NORVASC) tablet 10 mg  10 mg Per Tube Daily Kipp Brood, MD   10 mg at 10/25/19 0949  . bethanechol (URECHOLINE) tablet 10 mg  10 mg Per Tube TID Kipp Brood, MD   10  mg at 10/25/19 0949  . chlorhexidine (PERIDEX) 0.12 % solution 15 mL  15 mL Mouth Rinse BID Judith Part, MD   15 mL at 10/25/19 0950  . Chlorhexidine Gluconate Cloth 2 % PADS 6 each  6 each Topical Daily Judith Part, MD   6 each at 10/25/19 0950  . docusate (COLACE) 50 MG/5ML liquid 100 mg  100 mg Per Tube Daily Merlene Laughter F, NP   100 mg at 10/25/19 0949  . erythromycin ophthalmic ointment   Right Eye BID Jalene Mullet, MD   Given at 10/25/19 [phone]  . feeding supplement (OSMOLITE 1.2 CAL) liquid 1,000 mL  1,000 mL Per Tube  Continuous Judith Part, MD 70 mL/hr at 10/24/19 1239 1,000 mL at 10/24/19 1239  . feeding supplement (PRO-STAT SUGAR FREE 64) liquid 30 mL  30 mL Per Tube Daily Judith Part, MD   30 mL at 10/25/19 0949  . free water 200 mL  200 mL Per Tube Q4H Judith Part, MD   200 mL at 10/25/19 0950  . heparin injection 5,000 Units  5,000 Units Subcutaneous Q8H Kipp Brood, MD   5,000 Units at 10/25/19 [id number]  . HYDROcodone-acetaminophen (NORCO/VICODIN) 5-325 MG per tablet 1 tablet  1 tablet Per Tube Q4H PRN Ashok Pall, MD   1 tablet at 10/19/19 2118  . HYDROmorphone (DILAUDID) injection 0.5 mg  0.5 mg Intravenous Q3H PRN Judith Part, MD   0.5 mg at 10/19/19 0931  . ipratropium-albuterol (DUONEB) 0.5-2.5 (3) MG/3ML nebulizer solution 3 mL  3 mL Nebulization BID Candee Furbish, MD   3 mL at 10/25/19 0815  . labetalol (NORMODYNE) injection 10-40 mg  10-40 mg Intravenous Q10 min PRN Judith Part, MD   20 mg at 10/18/19 1404  . lisinopril (ZESTRIL) tablet 5 mg  5 mg Per Tube Daily Judith Part, MD   5 mg at 10/25/19 0949  . MEDLINE mouth rinse  15 mL Mouth Rinse q12n4p Judith Part, MD   15 mL at 10/23/19 1644  . multivitamin liquid 15 mL  15 mL Per Tube Daily Judith Part, MD   15 mL at 10/25/19 0957  . nafcillin 2 g in sodium chloride 0.9 % 100 mL IVPB  2 g Intravenous Q4H Judith Part, MD 200 mL/hr at 10/25/19 0949 2 g at 10/25/19 0949  . ondansetron (ZOFRAN) tablet 4 mg  4 mg Oral Q4H PRN Judith Part, MD       Or  . ondansetron (ZOFRAN) injection 4 mg  4 mg Intravenous Q4H PRN Judith Part, MD      . pantoprazole sodium (PROTONIX) 40 mg/20 mL oral suspension 40 mg  40 mg Per Tube Daily Judith Part, MD   40 mg at 10/25/19 0950  . promethazine (PHENERGAN) tablet 12.5-25 mg  12.5-25 mg Per Tube Q4H PRN Judith Part, MD      . QUEtiapine (SEROQUEL) tablet 50 mg  50 mg Per Tube QHS Judith Part, MD   50 mg at  10/24/19 2005     Discharge Medications: Please see discharge summary for a list of discharge medications.  Relevant Imaging Results:  Relevant Lab Results:   Additional Information SSN  [id number]  Vinie Sill, LCSWA

## 2019-10-25 NOTE — Progress Notes (Signed)
Physical Therapy Treatment Patient Details Name: Casey Richard MRN: [id number] DOB: 07-Dec-1955 Today's Date: 10/25/2019    History of Present Illness 64 y/o male admitted secondary to dizziness, weight loss, and gait instability. 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  MRI confirmed 2 large brain masses. S/p right crani for tumor excision on 10/07/19. PMH includes CKD and complete heart block s/p pacemaker. Neuro status changes s/p evacuation of post operative hematoma on 10/10/19. Patient then with acute hypoxic respiratory distress. CXR with bibasilar opacities with right greater then left, concerning for aspiration, re-intubated 4/26, s/p tracheostomy 4/27.  Trach collar on 4/29.  PEG tube 10/18/19.    PT Comments    Pt's goals updated today. He was able to walk a bit further with RW and two person min assist with chair to follow for one seated rest break.  He does fatigue and starts to drag his left foot a bit.  He continues to cough when PMV is applied, so we kept it off during gait.  He had it on for a short while as we prepared for gait out in the hallway.  He remains appropriate for post acute therapy and is making daily progress.  PT will continue to follow acutely for safe mobility progression.     Follow Up Recommendations  SNF     Equipment Recommendations  Rolling walker with 5" wheels;3in1 (PT);Wheelchair (measurements PT);Wheelchair cushion (measurements PT)    Recommendations for Other Services   NA     Precautions / Restrictions Precautions Precautions: Fall;Other (comment) Precaution Comments: trach, PMV, G-tube, cannot hear out of R ear    Mobility  Bed Mobility Overal bed mobility: Needs Assistance Bed Mobility: Sit to Supine Rolling: Supervision         General bed mobility comments: Pt able to control his trunk down to the pillow and lift both legs back into bed unassisted.  Supervision  for line management and safety.   Transfers Overall transfer level: Needs assistance Equipment used: Rolling walker (2 wheeled) Transfers: Sit to/from Stand Sit to Stand: Min assist;+2 physical assistance         General transfer comment: Min assist to support trunk to come to standing from low recliner chair.  Cues for safe hand placement.   Ambulation/Gait Ambulation/Gait assistance: Min assist;+2 safety/equipment Gait Distance (Feet): 45 Feet(x2) Assistive device: Rolling walker (2 wheeled) Gait Pattern/deviations: Step-through pattern;Ataxic;Staggering left Gait velocity: decreased Gait velocity interpretation: <1.8 ft/sec, indicate of risk for recurrent falls General Gait Details: Left lateral lean, L leg starts to drag with fatigue and pt has a hard time coordinating movement more on that side than R.  One person physically supporting pt during gait and one person following with chair for seated rest.        Balance Overall balance assessment: Needs assistance Sitting-balance support: Feet supported;Bilateral upper extremity supported Sitting balance-Leahy Scale: Fair     Standing balance support: Bilateral upper extremity supported Standing balance-Leahy Scale: Poor Standing balance comment: needs support from RW and external support from therapist.                             Cognition Arousal/Alertness: Awake/alert Behavior During Therapy: WFL for tasks assessed/performed Overall Cognitive Status: Impaired/Different from baseline Area of Impairment: Attention;Memory;Following commands;Awareness;Safety/judgement;Problem solving                 Orientation Level:  Time Current Attention Level: Selective Memory: Decreased short-term memory Following Commands: Follows multi-step commands inconsistently Safety/Judgement: Decreased awareness of safety;Decreased awareness of deficits Awareness: Intellectual Problem Solving: Difficulty  sequencing;Requires verbal cues;Requires tactile cues General Comments: Pt's affect has improved, he is becoming more aware of his trach and valve, but does not have great awarenss of his L LE fatiguing at the end of gait.  Needs continued reinforcement of safety precautions, safe hand placement with little carryover from session to session.       Exercises General Exercises - Lower Extremity Mini-Sqauts: AROM;Both;5 reps;Other (comment)(5 full squats down to the bed and back up)    General Comments General comments (skin integrity, edema, etc.): VSS on 28% 6L O2 [place] TC during gait (O2 93).  Pt continues to immediately cough with PMV placed      Pertinent Vitals/Pain Pain Assessment: No/denies pain           PT Goals (current goals can now be found in the care plan section) Acute Rehab PT Goals Patient Stated Goal: none stated today PT Goal Formulation: With patient Time For Goal Achievement: 11/08/19 Potential to Achieve Goals: Good Progress towards PT goals: Progressing toward goals(goals updated today)    Frequency    Min 2X/week      PT Plan Current plan remains appropriate       AM-PAC PT "6 Clicks" Mobility   Outcome Measure  Help needed turning from your back to your side while in a flat bed without using bedrails?: None Help needed moving from lying on your back to sitting on the side of a flat bed without using bedrails?: A Little Help needed moving to and from a bed to a chair (including a wheelchair)?: A Little Help needed standing up from a chair using your arms (e.g., wheelchair or bedside chair)?: A Little Help needed to walk in hospital room?: A Little Help needed climbing 3-5 steps with a railing? : A Lot 6 Click Score: 18    End of Session Equipment Utilized During Treatment: Gait belt;Oxygen Activity Tolerance: Patient limited by fatigue Patient left: in bed;with call bell/phone within reach;with bed alarm set Nurse Communication: Mobility  status PT Visit Diagnosis: Muscle weakness (generalized) (M62.81);Difficulty in walking, not elsewhere classified (R26.2);Dizziness and giddiness (R42);Pain     Time: 1430-1509 PT Time Calculation (min) (ACUTE ONLY): 39 min  Charges:  $Gait Training: 23-37 mins $Therapeutic Activity: 8-22 mins                    Verdene Lennert, PT, DPT  Acute Rehabilitation [phone] pager #[phone] office     10/25/2019, 5:35 PM

## 2019-10-25 NOTE — Progress Notes (Signed)
   10/25/19 0605  Urine Characteristics  Urine Color Yellow/straw  Urine Appearance Clear  Bladder Scan Volume (mL) 900 mL  Intermittent/Straight Cath (mL) 900 mL   Patient is due to void at 1200.

## 2019-10-25 NOTE — TOC Progression Note (Addendum)
Transition of Care [facility]) - Progression Note    Patient Details  Name: Casey Richard MRN: [id number] Date of Birth: 1955/07/15  Transition of Care [facility]) CM/SW Maish Vaya, [place] Phone Number: 10/25/2019, 12:05 PM  Clinical Narrative:     Patient's brother, Casey Richard selected [place] informed [place] can not accommodate trach patient. Casey Richard requested [place], then the  [facility] in [place]. CSW contacted [facility] to confirm bed offer, SNF preference; they will accept Fio2 at 28% but prefer 21% and the patient has had  trach for at least 30 days( trach placed 4/27).   CSW contacted [facility]/ patient needs to have trach for 30 days as well for their facility.   CSW will continue to follow and assist with discharge planning.  Thurmond Butts, MSW, LCSWA Clinical Social Worker    Expected Discharge Plan: Skilled Nursing Facility Barriers to Discharge: Ship broker, Continued Medical Work up  Expected Discharge Plan and Services Expected Discharge Plan: [place] In-house Referral: Clinical Social Work     Living arrangements for the past 2 months: Single Family Home                                       Social Determinants of Health (SDOH) Interventions    Readmission Risk Interventions No flowsheet data found.

## 2019-10-25 NOTE — Progress Notes (Signed)
   10/25/19 1227  Urine Characteristics  Urinary Interventions Bladder scan  Bladder Scan Volume (mL) 1000 mL (greater than 1000)   Foley catheter inserted per order for urinary retention and patient complaint of urge to void. Foley placed under sterile technique and per hospital protocol. Will continue to monitor urinary output.

## 2019-10-26 LAB — BASIC METABOLIC PANEL
Anion gap: 8 (ref 5–15)
BUN: 12 mg/dL (ref 8–23)
CO2: 28 mmol/L (ref 22–32)
Calcium: 7.9 mg/dL — ABNORMAL LOW (ref 8.9–10.3)
Chloride: 100 mmol/L (ref 98–111)
Creatinine, Ser: 0.53 mg/dL — ABNORMAL LOW (ref 0.61–1.24)
GFR calc Af Amer: >60 mL/min (ref >60)
GFR calc non Af Amer: >60 mL/min (ref >60)
Glucose, Bld: 130 mg/dL — ABNORMAL HIGH (ref 70–99)
Potassium: 4.1 mmol/L (ref 3.5–5.1)
Sodium: 136 mmol/L (ref 135–145)

## 2019-10-26 LAB — CBC
HCT: 29.5 % — ABNORMAL LOW (ref 39.0–52.0)
Hemoglobin: 9.5 g/dL — ABNORMAL LOW (ref 13.0–17.0)
MCH: 32.3 pg (ref 26.0–34.0)
MCHC: 32.2 g/dL (ref 30.0–36.0)
MCV: 100.3 fL — ABNORMAL HIGH (ref 80.0–100.0)
Platelets: 482 10*3/uL — ABNORMAL HIGH (ref 150–400)
RBC: 2.94 MIL/uL — ABNORMAL LOW (ref 4.22–5.81)
RDW: 13.2 % (ref 11.5–15.5)
WBC: 8.2 10*3/uL (ref 4.0–10.5)
nRBC: 0.6 % — ABNORMAL HIGH (ref 0.0–0.2)

## 2019-10-26 LAB — GLUCOSE, CAPILLARY: Glucose-Capillary: 115 mg/dL — ABNORMAL HIGH (ref 70–99)

## 2019-10-26 MED ORDER — IPRATROPIUM-ALBUTEROL 0.5-2.5 (3) MG/3ML IN SOLN: 3.0000 mL | Freq: Four times a day (QID) | RESPIRATORY_TRACT | Status: DC | PRN

## 2019-10-26 NOTE — Progress Notes (Signed)
A-11 form sent for disposable inner cannulas.

## 2019-10-26 NOTE — Progress Notes (Signed)
Subjective: NAEs o/n  Objective: Vital signs in last 24 hours: Temp:  [97.6 F (36.4 C)-98.6 F (37 C)] 97.8 F (36.6 C) (05/08 0739) Pulse Rate:  [80-91] 91 (05/08 1126) Resp:  [11-21] 15 (05/08 1126) BP: (128-140)/(74-95) 140/92 (05/08 0739) SpO2:  [96 %-100 %] 100 % (05/08 1126) FiO2 (%):  [28 %] 28 % (05/08 1126)  Intake/Output from previous day: 05/07 0701 - 05/08 0700 In: -  Out: 2300 [Urine:2300] Intake/Output this shift: No intake/output data recorded.  On TC, awake/interactive, FCx4, +R injection / corneal edema improving, stable R cranial nerve palsies  Lab Results: Recent Labs    10/25/19 0634 10/26/19 0641  WBC 6.0 8.2  HGB 9.5* 9.5*  HCT 29.6* 29.5*  PLT 420* 482*   BMET Recent Labs    10/25/19 0634 10/26/19 0641  NA 138 136  K 3.8 4.1  CL 103 100  CO2 27 28  GLUCOSE 134* 130*  BUN 16 12  CREATININE 0.54* 0.53*  CALCIUM 7.8* 7.9*    Studies/Results: No results found.  Assessment/Plan:   Assessment & Plan: 64 y.o. 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 MRI with good debulking of tumor, pre-post op measurements show decrease in tumor volume from 19.7cc-->4.3cc. 4/22 progressive obtundation, CTH with post-op resection cavity hemorrhage not present on MRI, brainstem compression and developing obstructive hydrocephalus, taken back to OR for hematoma removal and hemostasis. Unfortunately, as expected, post-op cranial nerve dysfunction after hematoma evacuation despite normal intra-op stimulation. Post-op CTH with improved mass effect on brainstem, slowly improving ventricular size with some IVH, 4/26 CTH stable ventricular size, hemorrhage resolving, 4/26 intubated for aspiration / PNA, 4/27 trach, 4/30 s/p PEG, 5/2 fever, trach aspirate Cx +S auerus, started 7d course of vanc  Neuro -R eye keratopathy / bacterial conjunctivitis - completed maxitrol, on erythromycin ointment day 4/4 then resume lacrilube gtt -final path  cystic schwannoma  Cardiopulm -wean O2 as tolerated on TC, working towards speaking valve  FENGI -qd RFP for hypernatremia, on 200q4 FWF, Na stable -s/p PEG, on TF & FW  Heme/ID -trach aspirate growing S aureus, on day 7/7 of Tx, on nafcillin 2q4, Cx MSSA -macrocytosis, on MVI  PPx/Dispo -SNF discharge pending, COVID test sent -SCDs/TEDs, SQH  Vallarie Mare 10/26/2019, 11:37 AM

## 2019-10-27 LAB — BASIC METABOLIC PANEL
Anion gap: 11 (ref 5–15)
BUN: 13 mg/dL (ref 8–23)
CO2: 26 mmol/L (ref 22–32)
Calcium: 8.1 mg/dL — ABNORMAL LOW (ref 8.9–10.3)
Chloride: 99 mmol/L (ref 98–111)
Creatinine, Ser: 0.57 mg/dL — ABNORMAL LOW (ref 0.61–1.24)
GFR calc Af Amer: >60 mL/min (ref >60)
GFR calc non Af Amer: >60 mL/min (ref >60)
Glucose, Bld: 109 mg/dL — ABNORMAL HIGH (ref 70–99)
Potassium: 4.7 mmol/L (ref 3.5–5.1)
Sodium: 136 mmol/L (ref 135–145)

## 2019-10-27 LAB — CBC
HCT: 31.4 % — ABNORMAL LOW (ref 39.0–52.0)
Hemoglobin: 10.1 g/dL — ABNORMAL LOW (ref 13.0–17.0)
MCH: 32.5 pg (ref 26.0–34.0)
MCHC: 32.2 g/dL (ref 30.0–36.0)
MCV: 101 fL — ABNORMAL HIGH (ref 80.0–100.0)
Platelets: 488 10*3/uL — ABNORMAL HIGH (ref 150–400)
RBC: 3.11 MIL/uL — ABNORMAL LOW (ref 4.22–5.81)
RDW: 13.6 % (ref 11.5–15.5)
WBC: 9.7 10*3/uL (ref 4.0–10.5)
nRBC: 0.5 % — ABNORMAL HIGH (ref 0.0–0.2)

## 2019-10-27 NOTE — Progress Notes (Signed)
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  Assessment/Plan: S/p debulking of cystic VS. - TFs - hep subQ - Nafcillin completed, AF with no leukocytosis - likely candidate for rehab vs SNF   Casey Richard 10/27/2019, 10:52 AM

## 2019-10-28 LAB — BASIC METABOLIC PANEL
Anion gap: 9 (ref 5–15)
BUN: 13 mg/dL (ref 8–23)
CO2: 25 mmol/L (ref 22–32)
Calcium: 8 mg/dL — ABNORMAL LOW (ref 8.9–10.3)
Chloride: 101 mmol/L (ref 98–111)
Creatinine, Ser: 0.51 mg/dL — ABNORMAL LOW (ref 0.61–1.24)
GFR calc Af Amer: >60 mL/min (ref >60)
GFR calc non Af Amer: >60 mL/min (ref >60)
Glucose, Bld: 122 mg/dL — ABNORMAL HIGH (ref 70–99)
Potassium: 4.1 mmol/L (ref 3.5–5.1)
Sodium: 135 mmol/L (ref 135–145)

## 2019-10-28 LAB — CBC
HCT: 30.7 % — ABNORMAL LOW (ref 39.0–52.0)
Hemoglobin: 9.7 g/dL — ABNORMAL LOW (ref 13.0–17.0)
MCH: 32.2 pg (ref 26.0–34.0)
MCHC: 31.6 g/dL (ref 30.0–36.0)
MCV: 102 fL — ABNORMAL HIGH (ref 80.0–100.0)
Platelets: 531 10*3/uL — ABNORMAL HIGH (ref 150–400)
RBC: 3.01 MIL/uL — ABNORMAL LOW (ref 4.22–5.81)
RDW: 14 % (ref 11.5–15.5)
WBC: 10.6 10*3/uL — ABNORMAL HIGH (ref 4.0–10.5)
nRBC: 0.5 % — ABNORMAL HIGH (ref 0.0–0.2)

## 2019-10-28 MED ORDER — QUETIAPINE FUMARATE 50 MG PO TABS
25.0000 mg | ORAL_TABLET | Freq: Every day | ORAL | Status: DC
  Administered 2019-10-28 – 2019-11-14 (×17): 25 mg
  Filled 2019-10-28 (×19): qty 1

## 2019-10-28 MED ORDER — ARTIFICIAL TEARS OPHTHALMIC OINT
TOPICAL_OINTMENT | OPHTHALMIC | Status: DC
  Administered 2019-10-28 – 2019-11-06 (×12): 1 via OPHTHALMIC
  Filled 2019-10-28 (×8): qty 3.5

## 2019-10-28 NOTE — Procedures (Signed)
Tracheostomy Change Note  Patient Details:   Name: Casey Richard DOB: [birth year] MRN: [id number]    Airway Documentation:     Evaluation  O2 sats: stable throughout Complications: No apparent complications Patient did tolerate procedure well. Bilateral Breath Sounds: Rhonchi, Diminished     RT along with RT Liliana Cline removed patient's #6 Shiley and replaced with #4 Shiley cuffless per MD order. Positive color change on ETCO2 detector and BBS heard. Patient tolerated trach change well. Vitals are stable. RT will continue to monitor.  Brenon Antosh Clyda Greener 10/28/2019, 4:55 PM

## 2019-10-28 NOTE — Progress Notes (Signed)
Physical Therapy Treatment Patient Details Name: Casey Richard MRN: [id number] DOB: 1955-10-17 Today's Date: 10/28/2019    History of Present Illness 63 y/o male admitted secondary to dizziness, weight loss, and gait instability. 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  MRI confirmed 2 large brain masses. S/p right crani for tumor excision on 10/07/19. PMH includes CKD and complete heart block s/p pacemaker. Neuro status changes s/p evacuation of post operative hematoma on 10/10/19. Patient then with acute hypoxic respiratory distress. CXR with bibasilar opacities with right greater then left, concerning for aspiration, re-intubated 4/26, s/p tracheostomy 4/27.  Trach collar on 4/29.  PEG tube 10/18/19.    PT Comments    Patient progressing slowly towards PT goals. Requires Mod A for standing transfers with cues for technique/hand placement and Mod A for gait training due to ataxia, staggering and weakness. Pt with 2 LOB posteriorly and to the right during gait requiring external support to prevent fall. Fatigues quickly today. VSS on 28% Fi02 6L [place] TC. Difficult to understand at times using PMV. Able to keep PMV on during gait with no distress. Remains appropriate for post acute therapy. Will follow.   Follow Up Recommendations  SNF     Equipment Recommendations  Rolling walker with 5" wheels;3in1 (PT);Wheelchair (measurements PT);Wheelchair cushion (measurements PT)    Recommendations for Other Services       Precautions / Restrictions Precautions Precautions: Fall;Other (comment) Precaution Comments: trach, PMV, G-tube, cannot hear out of R ear Restrictions Weight Bearing Restrictions: No    Mobility  Bed Mobility Overal bed mobility: Needs Assistance       Supine to sit: HOB elevated;Min guard     General bed mobility comments: Able to get to EOB using rail without assist;  impulsive  Transfers Overall transfer level: Needs assistance Equipment used: Rolling walker (2 wheeled) Transfers: Sit to/from Stand Sit to Stand: Mod assist         General transfer comment: assist to power to standing with cues for hand placement as pt pulling up on RW. Stood from [employer], from chair x2, better able to stand from surface with arm rests. Transferred to chair post ambulation.  Ambulation/Gait Ambulation/Gait assistance: Mod assist;+2 safety/equipment Gait Distance (Feet): 45 Feet Assistive device: Rolling walker (2 wheeled) Gait Pattern/deviations: Step-through pattern;Ataxic;Staggering left;Staggering right Gait velocity: decreased   General Gait Details: Slow, unsteady and ataxic like gait with left lateral lean and staggering in both directions with LOB x2 posteriorly and to the right requiring assist to prevent fall, chair follow needed. VSS on 28% Fi02 6L trach collar.   Stairs             Wheelchair Mobility    Modified Rankin (Stroke Patients Only)       Balance Overall balance assessment: Needs assistance Sitting-balance support: Feet supported;No upper extremity supported Sitting balance-Leahy Scale: Fair Sitting balance - Comments: supervision for safety   Standing balance support: During functional activity Standing balance-Leahy Scale: Poor Standing balance comment: needs support from RW and external support from therapist.                             Cognition Arousal/Alertness: Awake/alert Behavior During Therapy: WFL for tasks assessed/performed Overall Cognitive Status: Impaired/Different from baseline Area of Impairment: Attention;Memory                   Current  Attention Level: Selective Memory: Decreased short-term memory Following Commands: Follows multi-step commands inconsistently Safety/Judgement: Decreased awareness of safety;Decreased awareness of deficits Awareness: Intellectual Problem Solving:  Difficulty sequencing;Requires verbal cues;Requires tactile cues General Comments: Able to state month was may today; repeating self at times during session. poor awarness of safety/deficits esp LLE fatiguing at end of gait.      Exercises      General Comments General comments (skin integrity, edema, etc.): VSS on 28% 6L 02 [place] TC, some mild coughing noted with PMV placed. Difficult to understand at times.      Pertinent Vitals/Pain Pain Assessment: No/denies pain    Home Living                      Prior Function            PT Goals (current goals can now be found in the care plan section) Progress towards PT goals: Progressing toward goals    Frequency    Min 2X/week      PT Plan Current plan remains appropriate    Co-evaluation              AM-PAC PT "6 Clicks" Mobility   Outcome Measure  Help needed turning from your back to your side while in a flat bed without using bedrails?: None Help needed moving from lying on your back to sitting on the side of a flat bed without using bedrails?: A Little Help needed moving to and from a bed to a chair (including a wheelchair)?: A Lot Help needed standing up from a chair using your arms (e.g., wheelchair or bedside chair)?: A Lot Help needed to walk in hospital room?: A Lot Help needed climbing 3-5 steps with a railing? : A Lot 6 Click Score: 15    End of Session Equipment Utilized During Treatment: Gait belt;Oxygen Activity Tolerance: Patient limited by fatigue Patient left: in chair;with call bell/phone within reach;with chair alarm set Nurse Communication: Mobility status PT Visit Diagnosis: Muscle weakness (generalized) (M62.81);Difficulty in walking, not elsewhere classified (R26.2)     Time: [id number] PT Time Calculation (min) (ACUTE ONLY): 24 min  Charges:  $Gait Training: 8-22 mins $Therapeutic Activity: 8-22 mins                     Marisa Severin, PT, DPT Acute Rehabilitation Services Pager  [phone] Office [place] 10/28/2019, 3:31 PM

## 2019-10-28 NOTE — Progress Notes (Addendum)
  Speech Language Pathology Treatment: Casey Richard Speaking valve  Patient Details Name: Casey Richard MRN: [id number] DOB: [birth year] Today's Date: 10/28/2019 Time: [id number] SLP Time Calculation (min) (ACUTE ONLY): 12 min  Assessment / Plan / Recommendation Clinical Impression  Pt has had trach since 4/27: will need to be changed to cuffless before he can go to SNF. PMV tethered to trach; placed on trach hub - able to wear PMV briefly before it began to evoke coughing and it was ejected from trach hub.  After coughing ceased, valve replaced, again eliciting coughing and expulsion from end of trach.  Speech is quite dysarthric; requires verbal cues and repetition of instructions for pacing and over-articulation.  Ice chips offered - pt self fed with self-limiting bolus size; demonstrates active mastication and effort to swallow; wet vocal quality and coughing remains. He reports ongoing difficulty with sensation in oral cavity and throat.  He will benefit from repeat MBS this week (last study was 4/23).     HPI HPI: Pt is a 64 y.o. male admitted with dizziness and gait instability.  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 MRI with good debulking of tumor, pre-post op measurements showed decrease in tumor volume from 19.7cc-->4.3cc. 4/22 presented with progressive obtundation, CTH with post-op resection cavity hemorrhage not present on MRI, brainstem compression and developing obstructive hydrocephalus, taken back to OR 4/22 for hematoma removal and hemostasis. Per surgery notes, unfortunately, as expected, there was post-op cranial nerve dysfunction after hematoma evacuation despite normal intra-op stimulation. Post-op CTH with improved mass effect on brainstem, slowly improving ventricular size with some IVH. ETT 4/26 Trach 4/27. PEG placed on 4/30.  CXR 4/30: Bilateral LOWER lung airspace opacities again noted, slightly improved on left.      SLP Plan  Continue with current plan of care   Remove PMV when pt is alone in room.     Recommendations  Diet recommendations: NPO      PMSV Supervision: full MD: Please consider changing trach tube to : Smaller size;Cuffless         Oral Care Recommendations: Oral care QID SLP Visit Diagnosis: Dysphagia, oropharyngeal phase (R13.12) Plan: Continue with current plan of care       GO               Casey Richard, [place] CCC/SLP Acute Rehabilitation Services Office number [phone] Pager [phone]  Casey Richard 10/28/2019, 12:04 PM

## 2019-10-28 NOTE — TOC Progression Note (Signed)
Transition of Care [facility]) - Progression Note    Patient Details  Name: AKON BERTANI MRN: [id number] Date of Birth: [birth year]  Transition of Care [facility]) CM/SW [place], [place] Phone Number: 10/28/2019, 1:02 PM  Clinical Narrative:     CSW cancelled insurance authorization request since the patient is not medically stable for transfer to SNF.  Thurmond Butts, MSW, [place] Clinical Social Worker   Expected Discharge Plan: Skilled Nursing Facility Barriers to Discharge: Ship broker, Continued Medical Work up(SNF require 30 day or more before admit with trach)  Expected Discharge Plan and Services Expected Discharge Plan: [place] In-house Referral: Clinical Social Work     Living arrangements for the past 2 months: Single Family Home                                       Social Determinants of Health (SDOH) Interventions    Readmission Risk Interventions No flowsheet data found.

## 2019-10-28 NOTE — Progress Notes (Signed)
NAME:  Casey Richard, MRN:  [id number], DOB:  1956-03-20, LOS: 1 ADMISSION DATE:  09/27/2019, CONSULTATION DATE:  10/13/2019 REFERRING MD:  Dr. Zada Finders, CHIEF COMPLAINT:  Acute Hypoxic Respiratory Distress   History of present illness   64 year old male presents to ED on 4/9 with gait instability, memory loss, and falls. CT Head with hypodense lesion with mass effect on pons, right cerebellum. Treated with steroids. 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 On 4/22 patient with lethargy. Taken for CT head which revealed post-operative hemorrhage with brainstem compression. Taken to OR for evacuation. 4/24 patient with acute hypoxic respiratory distress. CXR with bibasilar opacities with right greater then left, concerning for aspiration. PCCM consulted.  Course complicated by prolonged mechanical ventilation requiring tracheostomy 4/27  Past Medical History   Past Medical History:  Diagnosis Date  . Cardiac arrest ([place])   . CHB (complete heart block) ([place])   . 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 Dysrhythmia 1/13   bradycardia, VF, cardiac arrest/complete heart block with pacer inserted/LOV Dr Sallyanne Kuster 07/21/11 with interrogation and anesthesia guideline order on chart. Chest x ray 1/13 EPIC,, TEE and [facility]  1/13 EPIC  . Memory loss, short term    from fall with heart block 1/13  . Nonsustained ventricular tachycardia ([place]) 02/07/2014  . Presence of permanent cardiac pacemaker 07/15/2011   [employer]tronic Revo  . Seasonal allergies   . Shortness of breath      Significant Hospital Events   4/9 > Admit 4/19 > Resection  4/22 > Evacuation of hemorrhage  4/25 > PCCM Consulted  4/27 > Trached   Significant Diagnostic Tests:  CT Head 4/09 >  1. No acute intracranial or calvarial findings. 2. New large cystic mass at the right cerebellar pontine angle with mass effect on the pons and cerebellum, but no  resulting hydrocephalus. 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 Cystic neoplasm less likely. Recommend further evaluation with MRI of the brain without and with contrast (if possible; the patient has a pacemaker) and non emergent neuro surgical consultation. 3. No evidence of acute cervical spine fracture, traumatic subluxation or static signs of instability. 4. Mildly progressive cervical spondylosis   MR Brain 4/12 >  1. 4.7 x 3.4 x 4.1 cm multi-cystic right cerebellopontine angle mass with associated abnormal enhancement extending into the right internal auditory canal. 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 Significant associated mass effect upon the right cerebellopontine and cerebellomedullary angle with partial effacement of the fourth ventricle and mild adjacent parenchymal edema. No hydrocephalus. 2. A 17 x 14 mm hypoenhancing mass within the left aspect of the sella has increased in size as compared MRI 02/15/2012. 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 Correlate with relevant laboratory values. 3. Mild chronic small vessel ischemic, progressed as compared to 02/15/2012. 4. Mild ethmoid sinus mucosal thickening. Small bilateral maxillary sinus mucous retention cysts.  CT Head 4/22 >  Right occipital craniectomy and cranioplasty related 2 debulking surgery for cystic mass of the right CP angle region. Gross measurements of the lesion today are 3.2 x 5.3 x 4.5 cm. Internal hemorrhage within the residual cystic lesion. Mass-effect upon the fourth ventricle with subsequent enlargement of the lateral and third ventricles consistent with mild hydrocephalus. Small amount of blood layering dependently in the  occipital horns. Small amount of subdural blood along the inferior tentorium as well as dissecting  inferiorly to and through the foramen magnum to the C1 level with mild mass effect upon the brainstem.   CXR 4//24 > 1. Bibasilar opacities, right greater than left may represent aspiration or pneumonia. There may be associated effusion on the right. Recommend clinical correlation and short-term follow-up imaging to ensure resolution. 2. No other acute abnormalities.  Head CT 4/26 >: 1. Interval improvement in hemorrhage in the tumor bed right cerebellar pontine angle . 2. Mild ventricular hemorrhage and mild hydrocephalus unchanged. Subarachnoid hemorrhage bilaterally appears unchanged on the right but improved on the left. No new hemorrhage.  Micro Data:  Sputum 4/25 > negative  Blood 4/25 > negative  Urine culture 4/9 > Staph Epidermis > sensitive to cipro or bactrium,  5/1 resp: mod MSSA 5/1 blood: ngtd 5/1 urine: neg  Antimicrobials:  Zosyn 4/25 >> 4/30 vanc 5/2  Interim history/subjective:  Continues to tolerate trach collar  Objective   Blood pressure 122/79, pulse 89, temperature 98 F (36.7 C), temperature source Oral, resp. rate 12, height 5\' 10"  (1.778 m), weight 63.4 kg, SpO2 96 %.    FiO2 (%):  [28 %] 28 %   Intake/Output Summary (Last 24 hours) at 10/28/2019 1524 Last data filed at 10/28/2019 1156 Gross per 24 hour  Intake 1640 ml  Output 3600 ml  Net -1960 ml   Filed Weights   10/23/19 0500 10/24/19 0500 10/28/19 0500  Weight: 64.5 kg 63 kg 63.4 kg    Examination: Chronically ill, elderly thin man, awake, interactive trach midline, #6 Shiley cuffed, right cranial nerve palsy -Bilateral decreased breath sounds No edema    Assessment & Plan:   Acute Hypoxic Respiratory Failure presumed secondary to aspiration PNA  - failed HFNC due to inability to control secretions. Location of tumor/bleed likely affecting pharyngeal function.  -Tracheostomy 4/27 -Completed treatment for MSSA pneumonia P: Routine trach care  Pulmonary hygiene  Okay to  downsize to 4 cuffless  CPA Mass s/p Resection, with hemorrhage s/p evacuation  -Pathology reveals cystic schwannoma P: Management per neurology  Maintain neuro protective measures; goal for eurothermia, euglycemia, eunatermia, normoxia  Seizure precautions     Dysphagia due to tumor  -Underwent PEG placement 4/30 P: TF s SLP following   H/O Heart Block V.Fib Arrest 2013 s/p Pacemaker P: Continuous telemetry                                       Agitated delirium P: Taper Seroquel to off   Kara Mead MD. FCCP. [facility] [place] [facility]  If no response to pager , please call [phone]   10/28/2019

## 2019-10-28 NOTE — Progress Notes (Signed)
Neurosurgery Service Progress Note  Subjective: NAE ON   Objective: Vitals:   10/28/19 0306 10/28/19 0451 10/28/19 0500 10/28/19 0743  BP: 123/86 121/78    Pulse: 85   84  Resp: 18   17  Temp:  97.9 F (36.6 C)    TempSrc:      SpO2: 99%   98%  Weight:   63.4 kg   Height:       Temp (24hrs), Avg:98.1 F (36.7 C), Min:97.6 F (36.4 C), Max:98.4 F (36.9 C)  CBC Latest Ref Rng & Units 10/28/2019 10/27/2019 10/26/2019  WBC 4.0 - 10.5 K/uL 10.6(H) 9.7 8.2  Hemoglobin 13.0 - 17.0 g/dL 9.7(L) 10.1(L) 9.5(L)  Hematocrit 39.0 - 52.0 % 30.7(L) 31.4(L) 29.5(L)  Platelets 150 - 400 K/uL 531(H) 488(H) 482(H)   BMP Latest Ref Rng & Units 10/28/2019 10/27/2019 10/26/2019  Glucose 70 - 99 mg/dL 122(H) 109(H) 130(H)  BUN 8 - 23 mg/dL 13 13 12   Creatinine 0.61 - 1.24 mg/dL 0.51(L) 0.57(L) 0.53(L)  Sodium 135 - 145 mmol/L 135 136 136  Potassium 3.5 - 5.1 mmol/L 4.1 4.7 4.1  Chloride 98 - 111 mmol/L 101 99 100  CO2 22 - 32 mmol/L 25 26 28   Calcium 8.9 - 10.3 mg/dL 8.0(L) 8.1(L) 7.9(L)    Intake/Output Summary (Last 24 hours) at 10/28/2019 0917 Last data filed at 10/28/2019 0442 Gross per 24 hour  Intake 1640 ml  Output 2750 ml  Net -1110 ml    Current Facility-Administered Medications:  .  0.9 %  sodium chloride infusion, , Intravenous, PRN, Judith Part, MD, Stopped at 10/25/19 1416 .  acetaminophen (TYLENOL) 160 MG/5ML solution 650 mg, 650 mg, Per Tube, Q4H PRN, Ashok Pall, MD, 650 mg at 10/28/19 0034 .  [DISCONTINUED] acetaminophen (TYLENOL) tablet 650 mg, 650 mg, Oral, Q4H PRN **OR** acetaminophen (TYLENOL) suppository 650 mg, 650 mg, Rectal, Q4H PRN, Judith Part, MD .  amLODipine (NORVASC) tablet 10 mg, 10 mg, Per Tube, Daily, Agarwala, Ravi, MD, 10 mg at 10/28/19 0916 .  bethanechol (URECHOLINE) tablet 10 mg, 10 mg, Per Tube, TID, Kipp Brood, MD, 10 mg at 10/28/19 0916 .  chlorhexidine (PERIDEX) 0.12 % solution 15 mL, 15 mL, Mouth Rinse, BID, Kylene Zamarron, Joyice Faster,  MD, 15 mL at 10/28/19 0916 .  Chlorhexidine Gluconate Cloth 2 % PADS 6 each, 6 each, Topical, Daily, Judith Part, MD, 6 each at 10/28/19 [phone] .  docusate (COLACE) 50 MG/5ML liquid 100 mg, 100 mg, Per Tube, Daily, Merlene Laughter F, NP, 100 mg at 10/28/19 0916 .  feeding supplement (OSMOLITE 1.2 CAL) liquid 1,000 mL, 1,000 mL, Per Tube, Continuous, Ikeisha Blumberg, Joyice Faster, MD, Last Rate: 70 mL/hr at 10/28/19 0400, 1,000 mL at 10/28/19 0400 .  feeding supplement (PRO-STAT SUGAR FREE 64) liquid 30 mL, 30 mL, Per Tube, Daily, Corrisa Gibby A, MD, 30 mL at 10/28/19 0916 .  free water 200 mL, 200 mL, Per Tube, Q4H, Arnell Slivinski A, MD, 200 mL at 10/28/19 0800 .  heparin injection 5,000 Units, 5,000 Units, Subcutaneous, Q8H, Agarwala, Ravi, MD, 5,000 Units at 10/28/19 0601 .  HYDROcodone-acetaminophen (NORCO/VICODIN) 5-325 MG per tablet 1 tablet, 1 tablet, Per Tube, Q4H PRN, Ashok Pall, MD, 1 tablet at 10/26/19 2149 .  HYDROmorphone (DILAUDID) injection 0.5 mg, 0.5 mg, Intravenous, Q3H PRN, Judith Part, MD, 0.5 mg at 10/19/19 0931 .  ipratropium-albuterol (DUONEB) 0.5-2.5 (3) MG/3ML nebulizer solution 3 mL, 3 mL, Nebulization, Q6H PRN, Judith Part, MD .  labetalol (NORMODYNE) injection 10-40 mg, 10-40 mg, Intravenous, Q10 min PRN, Judith Part, MD, 20 mg at 10/18/19 1404 .  lisinopril (ZESTRIL) tablet 5 mg, 5 mg, Per Tube, Daily, Jaleya Pebley, Joyice Faster, MD, 5 mg at 10/28/19 0916 .  MEDLINE mouth rinse, 15 mL, Mouth Rinse, q12n4p, Niyla Marone, Joyice Faster, MD, 15 mL at 10/27/19 1733 .  multivitamin liquid 15 mL, 15 mL, Per Tube, Daily, Judith Part, MD, 15 mL at 10/28/19 0916 .  ondansetron (ZOFRAN) tablet 4 mg, 4 mg, Oral, Q4H PRN **OR** ondansetron (ZOFRAN) injection 4 mg, 4 mg, Intravenous, Q4H PRN, Aqsa Sensabaugh A, MD .  pantoprazole sodium (PROTONIX) 40 mg/20 mL oral suspension 40 mg, 40 mg, Per Tube, Daily, Judith Part, MD, 40 mg at 10/28/19 0917 .   promethazine (PHENERGAN) tablet 12.5-25 mg, 12.5-25 mg, Per Tube, Q4H PRN, Judith Part, MD .  QUEtiapine (SEROQUEL) tablet 50 mg, 50 mg, Per Tube, QHS, Judith Part, MD, 50 mg at 10/27/19 2155   Physical Exam: On TC, awake/interactive, FCx4, +R injection / corneal edema improving, stable R cranial nerve palsies  Assessment & Plan: 64 y.o. 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 MRI with good debulking of tumor, pre-post op measurements show decrease in tumor volume from 19.7cc-->4.3cc. 4/22 progressive obtundation, CTH with post-op resection cavity hemorrhage not present on MRI, brainstem compression and developing obstructive hydrocephalus, taken back to OR for hematoma removal and hemostasis. Unfortunately, as expected, post-op cranial nerve dysfunction after hematoma evacuation despite normal intra-op stimulation. Post-op CTH with improved mass effect on brainstem, slowly improving ventricular size with some IVH, 4/26 CTH stable ventricular size, hemorrhage resolving, 4/26 intubated for aspiration / PNA, 4/27 trach, 4/30 s/p PEG, 5/2 fever, trach aspirate Cx +S auerus, started 7d course of vanc  Neuro -R eye keratopathy improved, restarted lacrilube gtt to right eye q4h -final path cystic schwannoma  Cardiopulm -wean O2 as tolerated on TC, working towards speaking valve  FENGI -on 200q4 FWF, Na stable, will d/c daily checks -s/p PEG, on TF & FW -foley replaced for retention  Heme/ID -s/p completed Tx for MSSA PNA, afebrile, no issues -macrocytosis, on MVI  PPx/Dispo -SNF discharge pending -SCDs/TEDs, Jackey Loge  10/28/19 9:17 AM

## 2019-10-29 LAB — GLUCOSE, CAPILLARY: Glucose-Capillary: 109 mg/dL — ABNORMAL HIGH (ref 70–99)

## 2019-10-29 NOTE — Progress Notes (Signed)
Occupational Therapy Treatment Patient Details Name: Casey Richard MRN: [id number] DOB: 1955/09/25 Today's Date: 10/29/2019    History of present illness 64 y/o male admitted secondary to dizziness, weight loss, and gait instability. 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  MRI confirmed 2 large brain masses. S/p right crani for tumor excision on 10/07/19. PMH includes CKD and complete heart block s/p pacemaker. Neuro status changes s/p evacuation of post operative hematoma on 10/10/19. Patient then with acute hypoxic respiratory distress. CXR with bibasilar opacities with right greater then left, concerning for aspiration, re-intubated 4/26, s/p tracheostomy 4/27.  Trach collar on 4/29.  PEG tube 10/18/19.   OT comments  Pt fatigued this date with minimal participation - RN reports he was up in the chair earlier this am.  He required mod A for bed mobility, and was able to perform simple grooming tasks with min A before fatiguing and spontaneously returning to supine. VSS  Follow Up Recommendations  SNF(Pt not a candidate for CIR )    Equipment Recommendations  None recommended by OT    Recommendations for Other Services      Precautions / Restrictions Precautions Precautions: Fall;Other (comment) Precaution Comments: trach, PMV, G-tube, cannot hear out of R ear       Mobility Bed Mobility Overal bed mobility: Needs Assistance Bed Mobility: Sidelying to Sit;Sit to Supine   Sidelying to sit: Mod assist   Sit to supine: Min assist   General bed mobility comments: assist to initiate task, assist to move LEs off the bed and assist to lift trunk.  Pt spontaneously moved back to supine, but was unable to straighten himself up in the bed   Transfers                 General transfer comment: Pt unable today due to fatigue     Balance Overall balance assessment: Needs assistance Sitting-balance support:  Feet supported;No upper extremity supported Sitting balance-Leahy Scale: Fair Sitting balance - Comments: Pt required supervision for EOB sitting, but fatigued quickly returning to supine                                    ADL either performed or assessed with clinical judgement   ADL Overall ADL's : Needs assistance/impaired Eating/Feeding: NPO   Grooming: Wash/dry face;Set up;Supervision/safety;Bed level Grooming Details (indicate cue type and reason): Pt requested a wash cloth to wipe his mouth while seated EOB, but retuned to supine to complete the task due to fatigue                              Functional mobility during ADLs: Moderate assistance       Vision   Additional Comments: Pt still indicates diplopia    Perception     Praxis      Cognition Arousal/Alertness: Lethargic;Awake/alert Behavior During Therapy: Flat affect;Impulsive;Restless Overall Cognitive Status: Impaired/Different from baseline Area of Impairment: Attention;Following commands;Awareness;Problem solving                   Current Attention Level: Sustained;Focused   Following Commands: Follows one step commands inconsistently Safety/Judgement: Decreased awareness of safety   Problem Solving: Slow processing;Difficulty sequencing;Requires verbal cues;Requires tactile cues General Comments: Pt fatigued quickly while on EOB.  He initially followed commands well, but  as he fatigued he  ability to follow commands decreased         Exercises     Shoulder Instructions       General Comments RN reports pt sat up in the chair this am.  VSS with pt on 28% Fi02      Pertinent Vitals/ Pain       Pain Assessment: Faces Faces Pain Scale: No hurt  Home Living                                          Prior Functioning/Environment              Frequency  Min 2X/week        Progress Toward Goals  OT Goals(current goals can now be  found in the care plan section)  Progress towards OT goals: Not progressing toward goals - comment(fatigue )  Acute Rehab OT Goals Patient Stated Goal: none stated today Time For Goal Achievement: 11/12/19 Potential to Achieve Goals: Good  Plan Discharge plan needs to be updated;Frequency needs to be updated;Other (comment)(goals addressed and date for achievement extended )    Co-evaluation                 AM-PAC OT "6 Clicks" Daily Activity     Outcome Measure   Help from another person eating meals?: Total Help from another person taking care of personal grooming?: A Lot Help from another person toileting, which includes using toliet, bedpan, or urinal?: A Lot Help from another person bathing (including washing, rinsing, drying)?: A Lot Help from another person to put on and taking off regular upper body clothing?: A Lot Help from another person to put on and taking off regular lower body clothing?: A Lot 6 Click Score: 11    End of Session Equipment Utilized During Treatment: Oxygen  OT Visit Diagnosis: Unsteadiness on feet (R26.81);Cognitive communication deficit (R41.841)   Activity Tolerance Patient limited by fatigue   Patient Left in bed;with call bell/phone within reach;with bed alarm set   Nurse Communication Mobility status        Time: 1207-1224 OT Time Calculation (min): 17 min  Charges: OT General Charges $OT Visit: 1 Visit OT Treatments $Self Care/Home Management : 8-22 mins  Nilsa Nutting OTR/L Acute Rehabilitation Services Pager [phone] Office [phone]    Lucille Passy M 10/29/2019, 1:55 PM

## 2019-10-29 NOTE — Progress Notes (Signed)
Neurosurgery Service Progress Note  Subjective: NAE ON   Objective: Vitals:   10/29/19 0330 10/29/19 0337 10/29/19 0340 10/29/19 0726  BP:   121/81 125/79  Pulse: (!) 101  (!) 101 (!) 103  Resp: 11  15 16   Temp:  97.6 F (36.4 C)  98.8 F (37.1 C)  TempSrc:  Oral  Oral  SpO2:   100% 100%  Weight:      Height:       Temp (24hrs), Avg:98.3 F (36.8 C), Min:97.6 F (36.4 C), Max:98.8 F (37.1 C)  CBC Latest Ref Rng & Units 10/28/2019 10/27/2019 10/26/2019  WBC 4.0 - 10.5 K/uL 10.6(H) 9.7 8.2  Hemoglobin 13.0 - 17.0 g/dL 9.7(L) 10.1(L) 9.5(L)  Hematocrit 39.0 - 52.0 % 30.7(L) 31.4(L) 29.5(L)  Platelets 150 - 400 K/uL 531(H) 488(H) 482(H)   BMP Latest Ref Rng & Units 10/28/2019 10/27/2019 10/26/2019  Glucose 70 - 99 mg/dL 122(H) 109(H) 130(H)  BUN 8 - 23 mg/dL 13 13 12   Creatinine 0.61 - 1.24 mg/dL 0.51(L) 0.57(L) 0.53(L)  Sodium 135 - 145 mmol/L 135 136 136  Potassium 3.5 - 5.1 mmol/L 4.1 4.7 4.1  Chloride 98 - 111 mmol/L 101 99 100  CO2 22 - 32 mmol/L 25 26 28   Calcium 8.9 - 10.3 mg/dL 8.0(L) 8.1(L) 7.9(L)    Intake/Output Summary (Last 24 hours) at 10/29/2019 0735 Last data filed at 10/29/2019 0727 Gross per 24 hour  Intake 3089.66 ml  Output 3850 ml  Net -760.34 ml    Current Facility-Administered Medications:  .  0.9 %  sodium chloride infusion, , Intravenous, PRN, Judith Part, MD, Stopped at 10/25/19 1416 .  acetaminophen (TYLENOL) 160 MG/5ML solution 650 mg, 650 mg, Per Tube, Q4H PRN, Ashok Pall, MD, 650 mg at 10/28/19 0034 .  [DISCONTINUED] acetaminophen (TYLENOL) tablet 650 mg, 650 mg, Oral, Q4H PRN **OR** acetaminophen (TYLENOL) suppository 650 mg, 650 mg, Rectal, Q4H PRN, Judith Part, MD .  amLODipine (NORVASC) tablet 10 mg, 10 mg, Per Tube, Daily, Agarwala, Ravi, MD, 10 mg at 10/28/19 0916 .  artificial tears (LACRILUBE) ophthalmic ointment, , Right Eye, Q4H, Judith Part, MD, 1 application at [phone] .  bethanechol (URECHOLINE) tablet  10 mg, 10 mg, Per Tube, TID, Kipp Brood, MD, 10 mg at 10/28/19 2234 .  chlorhexidine (PERIDEX) 0.12 % solution 15 mL, 15 mL, Mouth Rinse, BID, Lizbeth Feijoo, Joyice Faster, MD, 15 mL at 10/28/19 2235 .  Chlorhexidine Gluconate Cloth 2 % PADS 6 each, 6 each, Topical, Daily, Judith Part, MD, 6 each at 10/28/19 [phone] .  docusate (COLACE) 50 MG/5ML liquid 100 mg, 100 mg, Per Tube, Daily, Merlene Laughter F, NP, 100 mg at 10/28/19 0916 .  feeding supplement (OSMOLITE 1.2 CAL) liquid 1,000 mL, 1,000 mL, Per Tube, Continuous, Davanee Klinkner, Joyice Faster, MD, Last Rate: 70 mL/hr at 10/29/19 0631, 1,000 mL at 10/29/19 0631 .  feeding supplement (PRO-STAT SUGAR FREE 64) liquid 30 mL, 30 mL, Per Tube, Daily, Lakshya Mcgillicuddy A, MD, 30 mL at 10/28/19 0916 .  free water 200 mL, 200 mL, Per Tube, Q4H, Jancarlos Thrun A, MD, 200 mL at 10/29/19 0400 .  heparin injection 5,000 Units, 5,000 Units, Subcutaneous, Q8H, Agarwala, Ravi, MD, 5,000 Units at 10/29/19 0630 .  HYDROcodone-acetaminophen (NORCO/VICODIN) 5-325 MG per tablet 1 tablet, 1 tablet, Per Tube, Q4H PRN, Ashok Pall, MD, 1 tablet at 10/26/19 2149 .  HYDROmorphone (DILAUDID) injection 0.5 mg, 0.5 mg, Intravenous, Q3H PRN, Judith Part, MD, 0.5 mg  at 10/19/19 0931 .  ipratropium-albuterol (DUONEB) 0.5-2.5 (3) MG/3ML nebulizer solution 3 mL, 3 mL, Nebulization, Q6H PRN, Lashondra Vaquerano A, MD .  labetalol (NORMODYNE) injection 10-40 mg, 10-40 mg, Intravenous, Q10 min PRN, Judith Part, MD, 20 mg at 10/18/19 1404 .  lisinopril (ZESTRIL) tablet 5 mg, 5 mg, Per Tube, Daily, Daleiza Bacchi, Joyice Faster, MD, 5 mg at 10/28/19 0916 .  MEDLINE mouth rinse, 15 mL, Mouth Rinse, q12n4p, Shaylin Blatt, Joyice Faster, MD, 15 mL at 10/28/19 1533 .  multivitamin liquid 15 mL, 15 mL, Per Tube, Daily, Judith Part, MD, 15 mL at 10/28/19 0916 .  ondansetron (ZOFRAN) tablet 4 mg, 4 mg, Oral, Q4H PRN **OR** ondansetron (ZOFRAN) injection 4 mg, 4 mg, Intravenous, Q4H PRN,  Dailey Buccheri A, MD .  pantoprazole sodium (PROTONIX) 40 mg/20 mL oral suspension 40 mg, 40 mg, Per Tube, Daily, Judith Part, MD, 40 mg at 10/28/19 0917 .  promethazine (PHENERGAN) tablet 12.5-25 mg, 12.5-25 mg, Per Tube, Q4H PRN, Judith Part, MD .  QUEtiapine (SEROQUEL) tablet 25 mg, 25 mg, Per Tube, QHS, Rigoberto Noel, MD, 25 mg at 10/28/19 2234   Physical Exam: On TC, awake/interactive, FCx4, +R injection / corneal edema improving, stable R cranial nerve palsies  Assessment & Plan: 64 y.o. 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 MRI with good debulking of tumor, pre-post op measurements show decrease in tumor volume from 19.7cc-->4.3cc. 4/22 progressive obtundation, CTH with post-op resection cavity hemorrhage not present on MRI, brainstem compression and developing obstructive hydrocephalus, taken back to OR for hematoma removal and hemostasis. Unfortunately, as expected, post-op cranial nerve dysfunction after hematoma evacuation despite normal intra-op stimulation. Post-op CTH with improved mass effect on brainstem, slowly improving ventricular size with some IVH, 4/26 CTH stable ventricular size, hemorrhage resolving, 4/26 intubated for aspiration / PNA, 4/27 trach, 4/30 s/p PEG, 5/2 fever, trach aspirate Cx +S auerus, completed 7d course of cefazolin.  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

## 2019-10-30 NOTE — Progress Notes (Signed)
  Speech Language Pathology Treatment: Nada Boozer Speaking valve;Dysphagia  Patient Details Name: Casey Richard MRN: [id number] DOB: 1956/01/31 Today's Date: 10/30/2019 Time: 1100-1130 SLP Time Calculation (min) (ACUTE ONLY): 30 min  Assessment / Plan / Recommendation Clinical Impression  Trach changed to a cuffless #4, which has improved access to upper airway and the quality of Mr. Greenhaw's voice.  Volume is louder, voice is clearer - biggest obstacle for communication remains his dysarthria.  Today he was better able to modulate his speech, slowing down rate and over-enunciating, which improved intelligibility from 50 to 70%.  He did require constant verbal cues to improve clarity, and had difficulty carrying-over instructions with each new effort to speak.    Swallowing continues to be problematic.  Ice chips provided - pt required cues to attend to ice, masticate with effort, and execute an exaggerated, effortful swallow. He reported improved oral sensation, was able to differentiate number of ice chips, and demonstrated improved sensation to external stimulation to face.  He required multiple verbal cues to fully extend tongue and was able to localize touch. He continued to cough throughout ice chip trials.  Last MBS was 4/23, nearly three weeks ago. Recommend repeating MBS to determine any subtle changes in physiology. Pt agreed and stated he would like to repeat study tomorrow rather than waiting until Friday.  Will plan for MBS next date.   Please place PMV when in the room with pt. Allow 4-6 ice chips three times a day at minimum.      HPI HPI: Pt is a 64 y.o. male admitted with dizziness and gait instability.  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 MRI with good debulking of tumor, pre-post op measurements showed decrease in tumor  volume from 19.7cc-->4.3cc. 4/22 presented with progressive obtundation, CTH with post-op resection cavity hemorrhage not present on MRI, brainstem compression and developing obstructive hydrocephalus, taken back to OR 4/22 for hematoma removal and hemostasis. Per surgery notes, unfortunately, as expected, there was post-op cranial nerve dysfunction after hematoma evacuation despite normal intra-op stimulation. Post-op CTH with improved mass effect on brainstem, slowly improving ventricular size with some IVH. ETT 4/26 Trach 4/27. PEG placed on 4/30. CXR 4/30: Bilateral LOWER lung airspace opacities again noted, slightly improved on left.      SLP Plan  MBS;Continue with current plan of care       Recommendations  Diet recommendations: NPO(allow ice chips) Medication Administration: Via alternative means      Patient may use Passy-Muir Speech Valve: During all therapies with supervision PMSV Supervision: Full         Oral Care Recommendations: Oral care QID Follow up Recommendations: Inpatient Rehab SLP Visit Diagnosis: Dysphagia, oropharyngeal phase (R13.12) Plan: MBS;Continue with current plan of care       GO              Fabienne Nolasco L. Tivis Ringer, [place] Office number [phone] Pager [phone]   Juan Quam Laurice 10/30/2019, 11:36 AM

## 2019-10-30 NOTE — Progress Notes (Signed)
RN noticed trach appeared to be out. Respiratory was notified and came to bedside. Respiratory reinserted the trach and patient tolerated it well, with vital signs WNL.   Sharin Mons, RN

## 2019-10-30 NOTE — Progress Notes (Signed)
Nutrition Follow-up  DOCUMENTATION CODES:   Severe malnutrition in context of chronic illness  INTERVENTION:  Increase Osmolite 1.2 formula via PEG to new goal rate of 75 ml/hr.  Continue 30 ml Prostat once daily per tube.   Free water flushes of 200 ml every 4 hours. (MD to adjust as appropriate)  Tube feeding regimen provides 2260 kcal (100% of needs), 115 grams of protein, and 2676 ml water.   NUTRITION DIAGNOSIS:   Severe Malnutrition related to chronic illness as evidenced by severe muscle depletion, severe fat depletion, percent weight loss, energy intake < or equal to 75% for > or equal to 1 month; ongoing  GOAL:   Patient will meet greater than or equal to 90% of their needs; met with TF  MONITOR:   PO intake, Supplement acceptance, Weight trends, Labs  REASON FOR ASSESSMENT:   Consult Enteral/tube feeding initiation and management  ASSESSMENT:   Pt with a PMH significant for complete heart block s/p PPM who presented to the hospital for worsening dizziness, gait instability, and weight loss. MRI revealed 2 large brain masses.  4/19 s/p RIGHT CRANIOTOMY TUMOR EXCISION 4/22 s/p repeat crani for evacuation of hematoma after tumor excision  4/23 failed swallow, Cortrak placed; tip gastric  4/26 intubated 4/27 trach 4/30 PEG placed   Pt continues on trach collar and NPO status. Pt reports no abdominal discomfort during time of visit and has been tolerating his tube feedings well. Noted pt with gradual weight loss. RD to increase tube feeding to aid in increased caloric and protein needs as well as in prevention of further weight loss.   Labs and medications reviewed.   Diet Order:   Diet Order            Diet NPO time specified Except for: Ice Chips  Diet effective midnight              EDUCATION NEEDS:   Not appropriate for education at this time  Skin:  Skin Assessment: Skin Integrity Issues: Skin Integrity Issues:: Incisions Incisions:  head  Last BM:  5/9  Height:   Ht Readings from Last 1 Encounters:  10/14/19 '5\' 10"'  (1.778 m)    Weight:   Wt Readings from Last 1 Encounters:  10/30/19 61.6 kg    BMI:  Body mass index is 19.49 kg/m.  Estimated Nutritional Needs:   Kcal:  2050-2200  Protein:  100-115 grams  Fluid:  >2L/d  Corrin Parker, MS, RD, LDN RD pager number/after hours weekend pager number on Amion.

## 2019-10-30 NOTE — Progress Notes (Signed)
Neurosurgery Service Progress Note  Subjective: NAE ON   Objective: Vitals:   10/30/19 0400 10/30/19 0500 10/30/19 0749 10/30/19 0831  BP: 126/78  115/81   Pulse:   100 (!) 102  Resp: 17  20 (!) 22  Temp: 98.3 F (36.8 C)  97.9 F (36.6 C)   TempSrc: Oral  Axillary   SpO2:   98% 98%  Weight:  61.6 kg    Height:       Temp (24hrs), Avg:98 F (36.7 C), Min:97.4 F (36.3 C), Max:98.3 F (36.8 C)  CBC Latest Ref Rng & Units 10/28/2019 10/27/2019 10/26/2019  WBC 4.0 - 10.5 K/uL 10.6(H) 9.7 8.2  Hemoglobin 13.0 - 17.0 g/dL 9.7(L) 10.1(L) 9.5(L)  Hematocrit 39.0 - 52.0 % 30.7(L) 31.4(L) 29.5(L)  Platelets 150 - 400 K/uL 531(H) 488(H) 482(H)   BMP Latest Ref Rng & Units 10/28/2019 10/27/2019 10/26/2019  Glucose 70 - 99 mg/dL 122(H) 109(H) 130(H)  BUN 8 - 23 mg/dL 13 13 12   Creatinine 0.61 - 1.24 mg/dL 0.51(L) 0.57(L) 0.53(L)  Sodium 135 - 145 mmol/L 135 136 136  Potassium 3.5 - 5.1 mmol/L 4.1 4.7 4.1  Chloride 98 - 111 mmol/L 101 99 100  CO2 22 - 32 mmol/L 25 26 28   Calcium 8.9 - 10.3 mg/dL 8.0(L) 8.1(L) 7.9(L)    Intake/Output Summary (Last 24 hours) at 10/30/2019 0929 Last data filed at 10/30/2019 0751 Gross per 24 hour  Intake 2850.83 ml  Output 1950 ml  Net 900.83 ml    Current Facility-Administered Medications:  .  0.9 %  sodium chloride infusion, , Intravenous, PRN, Judith Part, MD, Stopped at 10/25/19 1416 .  acetaminophen (TYLENOL) 160 MG/5ML solution 650 mg, 650 mg, Per Tube, Q4H PRN, Ashok Pall, MD, 650 mg at 10/28/19 0034 .  [DISCONTINUED] acetaminophen (TYLENOL) tablet 650 mg, 650 mg, Oral, Q4H PRN **OR** acetaminophen (TYLENOL) suppository 650 mg, 650 mg, Rectal, Q4H PRN, Judith Part, MD .  amLODipine (NORVASC) tablet 10 mg, 10 mg, Per Tube, Daily, Agarwala, Ravi, MD, 10 mg at 10/29/19 0921 .  artificial tears (LACRILUBE) ophthalmic ointment, , Right Eye, Q4H, Judith Part, MD, Given at 10/30/19 0536 .  bethanechol (URECHOLINE) tablet 10 mg,  10 mg, Per Tube, TID, Kipp Brood, MD, 10 mg at 10/29/19 2221 .  chlorhexidine (PERIDEX) 0.12 % solution 15 mL, 15 mL, Mouth Rinse, BID, Joby Hershkowitz, Joyice Faster, MD, 15 mL at 10/29/19 2222 .  Chlorhexidine Gluconate Cloth 2 % PADS 6 each, 6 each, Topical, Daily, Judith Part, MD, 6 each at 10/29/19 [phone] .  docusate (COLACE) 50 MG/5ML liquid 100 mg, 100 mg, Per Tube, Daily, Merlene Laughter F, NP, 100 mg at 10/29/19 0921 .  feeding supplement (OSMOLITE 1.2 CAL) liquid 1,000 mL, 1,000 mL, Per Tube, Continuous, Brendia Dampier, Joyice Faster, MD, Last Rate: 70 mL/hr at 10/30/19 0309, 1,000 mL at 10/30/19 0309 .  feeding supplement (PRO-STAT SUGAR FREE 64) liquid 30 mL, 30 mL, Per Tube, Daily, Yanet Balliet A, MD, 30 mL at 10/29/19 0921 .  free water 200 mL, 200 mL, Per Tube, Q4H, Tedi Hughson A, MD, 200 mL at 10/30/19 0325 .  heparin injection 5,000 Units, 5,000 Units, Subcutaneous, Q8H, Agarwala, Einar Grad, MD, 5,000 Units at 10/30/19 0536 .  HYDROcodone-acetaminophen (NORCO/VICODIN) 5-325 MG per tablet 1 tablet, 1 tablet, Per Tube, Q4H PRN, Ashok Pall, MD, 1 tablet at 10/26/19 2149 .  HYDROmorphone (DILAUDID) injection 0.5 mg, 0.5 mg, Intravenous, Q3H PRN, Judith Part, MD, 0.5 mg at  10/19/19 0931 .  ipratropium-albuterol (DUONEB) 0.5-2.5 (3) MG/3ML nebulizer solution 3 mL, 3 mL, Nebulization, Q6H PRN, Deshara Rossi A, MD .  labetalol (NORMODYNE) injection 10-40 mg, 10-40 mg, Intravenous, Q10 min PRN, Judith Part, MD, 20 mg at 10/18/19 1404 .  lisinopril (ZESTRIL) tablet 5 mg, 5 mg, Per Tube, Daily, Jolonda Gomm, Joyice Faster, MD, 5 mg at 10/29/19 0921 .  MEDLINE mouth rinse, 15 mL, Mouth Rinse, q12n4p, Myanna Ziesmer A, MD, 15 mL at 10/29/19 1810 .  multivitamin liquid 15 mL, 15 mL, Per Tube, Daily, Judith Part, MD, 15 mL at 10/29/19 0921 .  ondansetron (ZOFRAN) tablet 4 mg, 4 mg, Oral, Q4H PRN **OR** ondansetron (ZOFRAN) injection 4 mg, 4 mg, Intravenous, Q4H PRN, Christean Silvestri,  Estephani Popper A, MD .  pantoprazole sodium (PROTONIX) 40 mg/20 mL oral suspension 40 mg, 40 mg, Per Tube, Daily, Judith Part, MD, 40 mg at 10/29/19 0921 .  promethazine (PHENERGAN) tablet 12.5-25 mg, 12.5-25 mg, Per Tube, Q4H PRN, Judith Part, MD .  QUEtiapine (SEROQUEL) tablet 25 mg, 25 mg, Per Tube, QHS, Rigoberto Noel, MD, 25 mg at 10/29/19 2221   Physical Exam: On TC, awake/interactive, FCx4, +R injection / corneal edema improving, stable R cranial nerve palsies  Assessment & Plan: 64 y.o. 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 MRI with good debulking of tumor, pre-post op measurements show decrease in tumor volume from 19.7cc-->4.3cc. 4/22 progressive obtundation, CTH with post-op resection cavity hemorrhage not present on MRI, brainstem compression and developing obstructive hydrocephalus, taken back to OR for hematoma removal and hemostasis. Unfortunately, as expected, post-op cranial nerve dysfunction after hematoma evacuation despite normal intra-op stimulation. Post-op CTH with improved mass effect on brainstem, slowly improving ventricular size with some IVH, 4/26 CTH stable ventricular size, hemorrhage resolving, 4/26 intubated for aspiration / PNA, 4/27 trach, 4/30 s/p PEG, 5/2 fever, trach aspirate Cx +S auerus, completed 7d course of cefazolin.  Neuro -R eye keratopathy improved, lacrilube gtt to right eye q4h -final path cystic schwannoma  Cardiopulm -no active issues, on TC  FENGI -s/p PEG, on TF & 200q4 FWF -foley replaced for retention  Heme/ID -s/p completed Tx for MSSA PNA, afebrile, no issues -macrocytosis, on MVI  PPx/Dispo -SNF discharge pending, per SW won't be accepted until 30d post-trach, which would be 5/27 -SCDs/TEDs, SQH  Judith Part  10/30/19 9:29 AM

## 2019-10-30 NOTE — Plan of Care (Signed)
  Problem: Education: Goal: Knowledge of General Education information will improve Description: Including pain rating scale, medication(s)/side effects and non-pharmacologic comfort measures Outcome: Progressing   Problem: Health Behavior/Discharge Planning: Goal: Ability to manage health-related needs will improve Outcome: Progressing   Problem: Clinical Measurements: Goal: Ability to maintain clinical measurements within normal limits will improve Outcome: Progressing Goal: Will remain free from infection Outcome: Progressing Goal: Diagnostic test results will improve Outcome: Progressing Goal: Respiratory complications will improve Outcome: Progressing Goal: Cardiovascular complication will be avoided Outcome: Progressing   Problem: Activity: Goal: Risk for activity intolerance will decrease Outcome: Progressing   Problem: Nutrition: Goal: Adequate nutrition will be maintained Outcome: Progressing   Problem: Coping: Goal: Level of anxiety will decrease Outcome: Progressing   Problem: Elimination: Goal: Will not experience complications related to bowel motility Outcome: Progressing Goal: Will not experience complications related to urinary retention Outcome: Progressing   Problem: Pain Managment: Goal: General experience of comfort will improve Outcome: Progressing   Problem: Safety: Goal: Ability to remain free from injury will improve Outcome: Progressing   Problem: Skin Integrity: Goal: Risk for impaired skin integrity will decrease Outcome: Progressing   Problem: Education: Goal: Knowledge of the prescribed therapeutic regimen will improve Outcome: Progressing   Problem: Clinical Measurements: Goal: Usual level of consciousness will be regained or maintained. Outcome: Progressing Goal: Neurologic status will improve Outcome: Progressing Goal: Ability to maintain intracranial pressure will improve Outcome: Progressing   Problem: Skin  Integrity: Goal: Demonstration of wound healing without infection will improve Outcome: Progressing   

## 2019-10-30 NOTE — Procedures (Signed)
Called to assess patient with trach that has come out.  Upon entering room, trach is obviously mal-positioned.  Used obturator to place trach correctly.  Used ETCO2 detector to confirm position, with positive color change to yellow.  Sats are stable on room air  ATC.

## 2019-10-30 NOTE — Progress Notes (Signed)
Physical Therapy Treatment Patient Details Name: Casey Richard MRN: [id number] DOB: [birth year] Today's Date: 10/30/2019    History of Present Illness 64 y/o male admitted secondary to dizziness, weight loss, and gait instability. 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  MRI confirmed 2 large brain masses. S/p right crani for tumor excision on 10/07/19. PMH includes CKD and complete heart block s/p pacemaker. Neuro status changes s/p evacuation of post operative hematoma on 10/10/19. Patient then with acute hypoxic respiratory distress. CXR with bibasilar opacities with right greater then left, concerning for aspiration, re-intubated 4/26, s/p tracheostomy 4/27.  Trach collar on 4/29.  PEG tube 10/18/19.    PT Comments    Pt tolerates treatment well, continuing to demonstrate significant lateral sway with multiple LOB during gait training requiring physical assistance to correct. Pt also requires frequent cueing for direction during OOB activity. Pt is impulsive at times and requires cues to stop initiation of mobility due to safety risks. Pt will continue to benefit from acute PT POC to improve activity tolerance, and to improve balance in hopes of reducing falls risk. PT continues to recommend SNF at time of discharge.   Follow Up Recommendations  SNF     Equipment Recommendations  Rolling walker with 5" wheels;3in1 (PT);Wheelchair (measurements PT);Wheelchair cushion (measurements PT)    Recommendations for Other Services       Precautions / Restrictions Precautions Precautions: Fall;Other (comment) Precaution Comments: trach, PMV, G-tube, cannot hear out of R ear Restrictions Weight Bearing Restrictions: No    Mobility  Bed Mobility Overal bed mobility: Needs Assistance Bed Mobility: Supine to Sit     Supine to sit: Min guard;HOB elevated        Transfers Overall transfer level: Needs  assistance Equipment used: Rolling walker (2 wheeled) Transfers: Sit to/from Stand Sit to Stand: Min assist;From elevated surface            Ambulation/Gait Ambulation/Gait assistance: Mod assist Gait Distance (Feet): 40 Feet Assistive device: Rolling walker (2 wheeled) Gait Pattern/deviations: Step-to pattern;Drifts right/left;Staggering right;Staggering left Gait velocity: reduced Gait velocity interpretation: <1.31 ft/sec, indicative of household ambulator General Gait Details: pt with increased sway in all directions requires modA to correct multiple LOB. Pt requires verbal cues for direction during gait   Stairs             Wheelchair Mobility    Modified Rankin (Stroke Patients Only)       Balance Overall balance assessment: Needs assistance Sitting-balance support: Single extremity supported;Feet supported Sitting balance-Leahy Scale: Fair Sitting balance - Comments: close supervision   Standing balance support: Bilateral upper extremity supported Standing balance-Leahy Scale: Fair Standing balance comment: minG-minA for static standing with BUE support of RW                            Cognition Arousal/Alertness: Awake/alert Behavior During Therapy: Impulsive Overall Cognitive Status: Impaired/Different from baseline Area of Impairment: Attention;Memory;Following commands;Safety/judgement;Awareness;Problem solving                   Current Attention Level: Sustained Memory: Decreased recall of precautions;Decreased short-term memory Following Commands: Follows one step commands with increased time Safety/Judgement: Decreased awareness of safety;Decreased awareness of deficits Awareness: Intellectual Problem Solving: Slow processing;Requires verbal cues        Exercises      General Comments General comments (skin integrity, edema, etc.): pt on 6L  28% FiO2 for mobility via trach collar, VSS      Pertinent Vitals/Pain Pain  Assessment: No/denies pain    Home Living                      Prior Function            PT Goals (current goals can now be found in the care plan section) Acute Rehab PT Goals Patient Stated Goal: none stated today Progress towards PT goals: Progressing toward goals    Frequency    Min 2X/week      PT Plan Current plan remains appropriate    Co-evaluation              AM-PAC PT "6 Clicks" Mobility   Outcome Measure  Help needed turning from your back to your side while in a flat bed without using bedrails?: None Help needed moving from lying on your back to sitting on the side of a flat bed without using bedrails?: A Little Help needed moving to and from a bed to a chair (including a wheelchair)?: A Lot Help needed standing up from a chair using your arms (e.g., wheelchair or bedside chair)?: A Little Help needed to walk in hospital room?: A Lot Help needed climbing 3-5 steps with a railing? : Total 6 Click Score: 15    End of Session Equipment Utilized During Treatment: Gait belt;Oxygen Activity Tolerance: Patient tolerated treatment well Patient left: in chair;with call bell/phone within reach;with chair alarm set Nurse Communication: Mobility status PT Visit Diagnosis: Muscle weakness (generalized) (M62.81);Difficulty in walking, not elsewhere classified (R26.2)     Time: [id number] PT Time Calculation (min) (ACUTE ONLY): 28 min  Charges:  $Gait Training: 8-22 mins $Therapeutic Activity: 8-22 mins                     Zenaida Niece, PT, DPT Acute Rehabilitation Pager: [phone]    Zenaida Niece 10/30/2019, 6:00 PM

## 2019-10-31 ENCOUNTER — Inpatient Hospital Stay (HOSPITAL_COMMUNITY): Payer: Medicare Other

## 2019-10-31 LAB — GLUCOSE, CAPILLARY
Glucose-Capillary: 129 mg/dL — ABNORMAL HIGH (ref 70–99)
Glucose-Capillary: 130 mg/dL — ABNORMAL HIGH (ref 70–99)

## 2019-10-31 MED ORDER — POLYETHYLENE GLYCOL 3350 17 G PO PACK
17.0000 g | PACK | Freq: Every day | ORAL | Status: DC
  Administered 2019-10-31 – 2019-11-15 (×10): 17 g via ORAL
  Filled 2019-10-31 (×13): qty 1

## 2019-10-31 NOTE — Progress Notes (Signed)
Modified Barium Swallow Progress Note  Patient Details  Name: RICHARDO SPURBECK MRN: [id number] Date of Birth: 03-11-1956  Today's Date: 10/31/2019  Modified Barium Swallow completed.  Full report located under Chart Review in the Imaging Section.  Brief recommendations include the following:  Clinical Impression  Pt presents with persisting severe dysphagia.  Only one teaspoon of thin liquid barium was tested due to severity of deficits.  Pt had difficulty with oral manipulation/control of thin bolus; it was transferred into pharynx and filled pyriform space.  Pharyngeal swallow was triggered, but mobility of hyolaryngeal complex was extremely limited.  There was no laryngeal vestibule closure, minimal pharyngeal squeeze, no epiglottic inversion and limited hyoid movement.  Pt was able to occasionally open UES slightly to allow <25% of bolus through, but most of bolus sat above UES and pt was unable to transfer it, nor could he expectorate it back into oral cavity for suctioning.  Study was discontinued. Continue NPO and PEG feedings. Discussed results and the plan to continue swallowing/speech therapy.     Swallow Evaluation Recommendations       SLP Diet Recommendations: NPO;Ice chips PRN after oral care       Medication Administration: Via alternative means               Oral Care Recommendations: Oral care QID        Juan Quam Laurice 10/31/2019,10:54 AM

## 2019-10-31 NOTE — Progress Notes (Signed)
Neurosurgery Service Progress Note  Subjective: NAE ON, trach came out yesterday, reinserted by RT without issue, has done well since  Objective: Vitals:   10/31/19 0310 10/31/19 0326 10/31/19 0618 10/31/19 0741  BP: 109/66   101/74  Pulse: (!) 25   92  Resp: 15   12  Temp: (!) 97.5 F (36.4 C) (!) 97.5 F (36.4 C)  97.6 F (36.4 C)  TempSrc: Oral   Axillary  SpO2: 91%   99%  Weight:   62.2 kg   Height:       Temp (24hrs), Avg:97.8 F (36.6 C), Min:97.5 F (36.4 C), Max:98.4 F (36.9 C)  CBC Latest Ref Rng & Units 10/28/2019 10/27/2019 10/26/2019  WBC 4.0 - 10.5 K/uL 10.6(H) 9.7 8.2  Hemoglobin 13.0 - 17.0 g/dL 9.7(L) 10.1(L) 9.5(L)  Hematocrit 39.0 - 52.0 % 30.7(L) 31.4(L) 29.5(L)  Platelets 150 - 400 K/uL 531(H) 488(H) 482(H)   BMP Latest Ref Rng & Units 10/28/2019 10/27/2019 10/26/2019  Glucose 70 - 99 mg/dL 122(H) 109(H) 130(H)  BUN 8 - 23 mg/dL 13 13 12   Creatinine 0.61 - 1.24 mg/dL 0.51(L) 0.57(L) 0.53(L)  Sodium 135 - 145 mmol/L 135 136 136  Potassium 3.5 - 5.1 mmol/L 4.1 4.7 4.1  Chloride 98 - 111 mmol/L 101 99 100  CO2 22 - 32 mmol/L 25 26 28   Calcium 8.9 - 10.3 mg/dL 8.0(L) 8.1(L) 7.9(L)    Intake/Output Summary (Last 24 hours) at 10/31/2019 0814 Last data filed at 10/31/2019 0741 Gross per 24 hour  Intake --  Output 1700 ml  Net -1700 ml    Current Facility-Administered Medications:  .  0.9 %  sodium chloride infusion, , Intravenous, PRN, Judith Part, MD, Stopped at 10/25/19 1416 .  acetaminophen (TYLENOL) 160 MG/5ML solution 650 mg, 650 mg, Per Tube, Q4H PRN, Ashok Pall, MD, 650 mg at 10/28/19 0034 .  [DISCONTINUED] acetaminophen (TYLENOL) tablet 650 mg, 650 mg, Oral, Q4H PRN **OR** acetaminophen (TYLENOL) suppository 650 mg, 650 mg, Rectal, Q4H PRN, Judith Part, MD .  amLODipine (NORVASC) tablet 10 mg, 10 mg, Per Tube, Daily, Agarwala, Ravi, MD, 10 mg at 10/30/19 1214 .  artificial tears (LACRILUBE) ophthalmic ointment, , Right Eye, Q4H,  Judith Part, MD, Given at 10/31/19 [phone] .  bethanechol (URECHOLINE) tablet 10 mg, 10 mg, Per Tube, TID, Kipp Brood, MD, 10 mg at 10/30/19 2125 .  chlorhexidine (PERIDEX) 0.12 % solution 15 mL, 15 mL, Mouth Rinse, BID, Josette Shimabukuro, Joyice Faster, MD, 15 mL at 10/30/19 2125 .  Chlorhexidine Gluconate Cloth 2 % PADS 6 each, 6 each, Topical, Daily, Judith Part, MD, 6 each at 10/30/19 1216 .  docusate (COLACE) 50 MG/5ML liquid 100 mg, 100 mg, Per Tube, Daily, Merlene Laughter F, NP, 100 mg at 10/30/19 1215 .  feeding supplement (OSMOLITE 1.2 CAL) liquid 1,000 mL, 1,000 mL, Per Tube, Continuous, Cerise Lieber, Joyice Faster, MD, Last Rate: 70 mL/hr at 10/30/19 0309, 1,000 mL at 10/30/19 0309 .  feeding supplement (PRO-STAT SUGAR FREE 64) liquid 30 mL, 30 mL, Per Tube, Daily, Filbert Craze, Joyice Faster, MD, 30 mL at 10/30/19 1215 .  free water 200 mL, 200 mL, Per Tube, Q4H, Nehemyah Foushee A, MD, 200 mL at 10/30/19 1600 .  heparin injection 5,000 Units, 5,000 Units, Subcutaneous, Q8H, Agarwala, Ravi, MD, 5,000 Units at 10/31/19 0610 .  HYDROcodone-acetaminophen (NORCO/VICODIN) 5-325 MG per tablet 1 tablet, 1 tablet, Per Tube, Q4H PRN, Ashok Pall, MD, 1 tablet at 10/30/19 1214 .  HYDROmorphone (DILAUDID)  injection 0.5 mg, 0.5 mg, Intravenous, Q3H PRN, Judith Part, MD, 0.5 mg at 10/19/19 0931 .  ipratropium-albuterol (DUONEB) 0.5-2.5 (3) MG/3ML nebulizer solution 3 mL, 3 mL, Nebulization, Q6H PRN, Safwan Tomei A, MD .  labetalol (NORMODYNE) injection 10-40 mg, 10-40 mg, Intravenous, Q10 min PRN, Judith Part, MD, 20 mg at 10/18/19 1404 .  lisinopril (ZESTRIL) tablet 5 mg, 5 mg, Per Tube, Daily, Jaliana Medellin, Joyice Faster, MD, 5 mg at 10/30/19 1214 .  MEDLINE mouth rinse, 15 mL, Mouth Rinse, q12n4p, Morene Cecilio A, MD, 15 mL at 10/30/19 1500 .  multivitamin liquid 15 mL, 15 mL, Per Tube, Daily, Zareena Willis, Joyice Faster, MD, 15 mL at 10/30/19 1216 .  ondansetron (ZOFRAN) tablet 4 mg, 4 mg, Oral,  Q4H PRN **OR** ondansetron (ZOFRAN) injection 4 mg, 4 mg, Intravenous, Q4H PRN, Bronnie Vasseur A, MD .  pantoprazole sodium (PROTONIX) 40 mg/20 mL oral suspension 40 mg, 40 mg, Per Tube, Daily, [place]al[place]ani Maiorino, Joyice Faster, MD, 40 mg at 10/30/19 1215 .  promethazine (PHENERGAN) tablet 12.5-25 mg, 12.5-25 mg, Per Tube, Q4H PRN, Judith Part, MD .  QUEtiapine (SEROQUEL) tablet 25 mg, 25 mg, Per Tube, QHS, Rigoberto Noel, MD, 25 mg at 10/30/19 2125   Physical Exam: On TC, awake/interactive, FCx4, +R injection / corneal edema stable, stable R cranial nerve palsies  Assessment & Plan: 64 y.o. 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 MRI with good debulking of tumor, pre-post op measurements show decrease in tumor volume from 19.7cc-->4.3cc. 4/22 progressive obtundation, CTH with post-op resection cavity hemorrhage not present on MRI, brainstem compression and developing obstructive hydrocephalus, taken back to OR for hematoma removal and hemostasis. Unfortunately, as expected, post-op cranial nerve dysfunction after hematoma evacuation despite normal intra-op stimulation. Post-op CTH with improved mass effect on brainstem, slowly improving ventricular size with some IVH, 4/26 CTH stable ventricular size, hemorrhage resolving, 4/26 intubated for aspiration / PNA, 4/27 trach, 4/30 s/p PEG, 5/2 fever, trach aspirate Cx +S auerus, completed 7d course of cefazolin.  Neuro -R eye keratopathy s/p ABx gtt, resolved. Continue lacrilube gtt to right eye q4h -final path cystic schwannoma  Cardiopulm -no active issues, on TC  FENGI -s/p PEG, on TF & 200q4 FWF -foley replaced for retention -repeat swallow eval today. Will discuss with pulm/CCM, if he can handle po intake safely and he is able to be capped, there is a chance we could decannulate him sooner rather than later, will have to see what the swallow study results and and move from there  Heme/ID -s/p completed Tx for MSSA PNA,  afebrile, no issues -macrocytosis, on MVI  PPx/Dispo -SNF discharge pending, per SW won't be accepted until 30d post-trach, which would be 5/27 -SCDs/TEDs, SQH  Judith Part  10/31/19 8:14 AM

## 2019-11-01 ENCOUNTER — Inpatient Hospital Stay (HOSPITAL_COMMUNITY): Payer: Medicare Other

## 2019-11-01 LAB — RENAL FUNCTION PANEL
Albumin: 2.1 g/dL — ABNORMAL LOW (ref 3.5–5.0)
Anion gap: 8 (ref 5–15)
BUN: 29 mg/dL — ABNORMAL HIGH (ref 8–23)
CO2: 27 mmol/L (ref 22–32)
Calcium: 8.3 mg/dL — ABNORMAL LOW (ref 8.9–10.3)
Chloride: 101 mmol/L (ref 98–111)
Creatinine, Ser: 0.76 mg/dL (ref 0.61–1.24)
GFR calc Af Amer: >60 mL/min (ref >60)
GFR calc non Af Amer: >60 mL/min (ref >60)
Glucose, Bld: 144 mg/dL — ABNORMAL HIGH (ref 70–99)
Phosphorus: 3.2 mg/dL (ref 2.5–4.6)
Potassium: 4.9 mmol/L (ref 3.5–5.1)
Sodium: 136 mmol/L (ref 135–145)

## 2019-11-01 LAB — CBC
HCT: 27.4 % — ABNORMAL LOW (ref 39.0–52.0)
Hemoglobin: 8.8 g/dL — ABNORMAL LOW (ref 13.0–17.0)
MCH: 33 pg (ref 26.0–34.0)
MCHC: 32.1 g/dL (ref 30.0–36.0)
MCV: 102.6 fL — ABNORMAL HIGH (ref 80.0–100.0)
Platelets: 489 10*3/uL — ABNORMAL HIGH (ref 150–400)
RBC: 2.67 MIL/uL — ABNORMAL LOW (ref 4.22–5.81)
RDW: 15.4 % (ref 11.5–15.5)
WBC: 19.5 10*3/uL — ABNORMAL HIGH (ref 4.0–10.5)
nRBC: 0.2 % (ref 0.0–0.2)

## 2019-11-01 LAB — GLUCOSE, CAPILLARY: Glucose-Capillary: 101 mg/dL — ABNORMAL HIGH (ref 70–99)

## 2019-11-01 LAB — URINALYSIS, ROUTINE W REFLEX MICROSCOPIC
Glucose, UA: NEGATIVE mg/dL
Ketones, ur: NEGATIVE mg/dL
Nitrite: NEGATIVE
Protein, ur: 30 mg/dL — AB
Specific Gravity, Urine: 1.023 (ref 1.005–1.030)
WBC, UA: >50 WBC/hpf — ABNORMAL HIGH (ref 0–5)
pH: 5 (ref 5.0–8.0)

## 2019-11-01 LAB — OCCULT BLOOD X 1 CARD TO LAB, STOOL: Fecal Occult Bld: POSITIVE — AB

## 2019-11-01 MED ORDER — SODIUM CHLORIDE 0.9 % IV BOLUS
500.0000 mL | Freq: Once | INTRAVENOUS | Status: AC
  Administered 2019-11-01: 500 mL via INTRAVENOUS

## 2019-11-01 NOTE — Progress Notes (Signed)
   11/01/19 0739  Assess: MEWS Score  Pulse Rate (!) 115  Resp 16  SpO2 96 %  O2 Device Tracheostomy Collar  O2 Flow Rate (L/min) 5 L/min  FiO2 (%) 21 %  Assess: MEWS Score  MEWS Temp 0  MEWS Systolic 0  MEWS Pulse 2  MEWS RR 0  MEWS LOC 0  MEWS Score 2  MEWS Score Color Yellow  Assess: if the MEWS score is Yellow or Red  Were vital signs taken at a resting state? Yes  Focused Assessment Documented focused assessment  Early Detection of Sepsis Score *See Row Information* Low  MEWS guidelines implemented *See Row Information* No, previously yellow, continue vital signs every 4 hours  This is baseline for this patient. Katherina Right RN

## 2019-11-01 NOTE — Progress Notes (Addendum)
Pressures soft throughout the day, tachycardia continues despite fluid bolus with 1cc/kg/h of UOP. Labs with prerenal azotemia and increase in leukocytosis, Hb downtrending 10.1-->8.8, respiratory status stable, not requiring inc'd FiO2 requirement with good sats.  -CXR, UA/UCX, BCx, repeat labs in AM -repeat 500cc bolus, cont to hold antihypertensives -guaiac with next BM

## 2019-11-01 NOTE — Progress Notes (Signed)
Occupational Therapy Treatment Patient Details Name: Casey Richard MRN: [id number] DOB: 10/18/1955 Today's Date: 11/01/2019    History of present illness 64 y/o male admitted secondary to dizziness, weight loss, and gait instability. 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  MRI confirmed 2 large brain masses. S/p right crani for tumor excision on 10/07/19. PMH includes CKD and complete heart block s/p pacemaker. Neuro status changes s/p evacuation of post operative hematoma on 10/10/19. Patient then with acute hypoxic respiratory distress. CXR with bibasilar opacities with right greater then left, concerning for aspiration, re-intubated 4/26, s/p tracheostomy 4/27.  Trach collar on 4/29.  PEG tube 10/18/19.   OT comments  Pt with glasses don in chair with blinds partially pulled and R hand cupping R eye. Pt expressed light sensitive at this time even in low lighting. Pt could possibly benefit from sunglasses to help with opening eyes and scanning. Pt also reports single vision with R len of glasses taped at this time. Pt up in chair and reports "feels good". Pt with PMV on collar at this time. Pt min (A) to transfer and noted to have low BP 80/60s even with movement. Pt with symptoms of dizziness reports with questioning.    Follow Up Recommendations  SNF    Equipment Recommendations  None recommended by OT    Recommendations for Other Services      Precautions / Restrictions Precautions Precautions: Fall;Other (comment) Precaution Comments: trach, PMV, G-tube, cannot hear out of R ear Restrictions Weight Bearing Restrictions: No       Mobility Bed Mobility Overal bed mobility: Needs Assistance Bed Mobility: Supine to Sit     Supine to sit: HOB elevated;Min assist     General bed mobility comments: pt needs cues mod to initiate the task  Transfers Overall transfer level: Needs assistance Equipment  used: 1 person hand held assist Transfers: Sit to/from Stand Sit to Stand: Min assist;From elevated surface              Balance Overall balance assessment: Needs assistance Sitting-balance support: Single extremity supported;Feet supported Sitting balance-Leahy Scale: Fair Sitting balance - Comments: close supervision   Standing balance support: Bilateral upper extremity supported Standing balance-Leahy Scale: Fair Standing balance comment: requires external support for upright posture                           ADL either performed or assessed with clinical judgement   ADL Overall ADL's : Needs assistance/impaired Eating/Feeding: NPO                                     General ADL Comments: pt with BP decreased so limited transfer to chair. RN reports pt in bed all day and very lethargic in general. pt aroused and attempting to exit bed alone on arrival      Vision       Perception     Praxis      Cognition Arousal/Alertness: Awake/alert Behavior During Therapy: Impulsive Overall Cognitive Status: Impaired/Different from baseline Area of Impairment: Attention;Memory;Following commands;Safety/judgement;Awareness;Problem solving                   Current Attention Level: Sustained Memory: Decreased recall of precautions;Decreased short-term memory Following Commands: Follows one step commands with increased time Safety/Judgement: Decreased awareness of safety;Decreased awareness  of deficits Awareness: Intellectual Problem Solving: Slow processing;Requires verbal cues General Comments: pt alarm sounding on arrival. pt unaware why. pt removing glasses on and off during sesison but reports single vision when don.         Exercises     Shoulder Instructions       General Comments Trach collar RA 87/61 standing 85/64 sitting. pt with low BP and reports "yes"" when asked if dizzy but does not express without cues    Pertinent  Vitals/ Pain       Pain Assessment: No/denies pain  Home Living                                          Prior Functioning/Environment              Frequency  Min 2X/week        Progress Toward Goals  OT Goals(current goals can now be found in the care plan section)  Progress towards OT goals: Progressing toward goals  Acute Rehab OT Goals Patient Stated Goal: to sit up  Time For Goal Achievement: 11/12/19 Potential to Achieve Goals: Good ADL Goals Pt Will Perform Grooming: with min assist;standing Pt Will Perform Lower Body Bathing: sit to/from stand;with min guard assist Pt Will Perform Lower Body Dressing: with min guard assist;sit to/from stand Pt Will Transfer to Toilet: with min assist;ambulating;bedside commode Pt Will Perform Toileting - Clothing Manipulation and hygiene: with min assist;sitting/lateral leans;sit to/from stand Additional ADL Goal #1: Pt will demonstrate selective attention during ADLs with Min cues  Plan Discharge plan needs to be updated;Frequency needs to be updated;Other (comment)    Co-evaluation                 AM-PAC OT "6 Clicks" Daily Activity     Outcome Measure   Help from another person eating meals?: Total Help from another person taking care of personal grooming?: A Lot Help from another person toileting, which includes using toliet, bedpan, or urinal?: A Lot Help from another person bathing (including washing, rinsing, drying)?: A Lot Help from another person to put on and taking off regular upper body clothing?: A Lot Help from another person to put on and taking off regular lower body clothing?: A Lot 6 Click Score: 11    End of Session    OT Visit Diagnosis: Unsteadiness on feet (R26.81);Cognitive communication deficit (R41.841)   Activity Tolerance Patient tolerated treatment well   Patient Left in chair;with call bell/phone within reach;with chair alarm set   Nurse Communication Mobility  status;Precautions        Time: [id number] OT Time Calculation (min): 18 min  Charges: OT General Charges $OT Visit: 1 Visit OT Treatments $Therapeutic Activity: 8-22 mins   Brynn, OTR/L  Acute Rehabilitation Services Pager: [phone] Office: [phone] .    Jeri Modena 11/01/2019, 4:23 PM

## 2019-11-01 NOTE — Progress Notes (Signed)
Neurosurgery Service Progress Note  Subjective: NAE ON  Objective: Vitals:   11/01/19 0250 11/01/19 0400 11/01/19 0524 11/01/19 0739  BP:  106/78    Pulse: 98 100  (!) 115  Resp: 13 14  16   Temp:  99 F (37.2 C)    TempSrc:  Oral    SpO2: 96% 95%  96%  Weight:   62.8 kg   Height:       Temp (24hrs), Avg:98 F (36.7 C), Min:97.5 F (36.4 C), Max:99 F (37.2 C)  CBC Latest Ref Rng & Units 10/28/2019 10/27/2019 10/26/2019  WBC 4.0 - 10.5 K/uL 10.6(H) 9.7 8.2  Hemoglobin 13.0 - 17.0 g/dL 9.7(L) 10.1(L) 9.5(L)  Hematocrit 39.0 - 52.0 % 30.7(L) 31.4(L) 29.5(L)  Platelets 150 - 400 K/uL 531(H) 488(H) 482(H)   BMP Latest Ref Rng & Units 10/28/2019 10/27/2019 10/26/2019  Glucose 70 - 99 mg/dL 122(H) 109(H) 130(H)  BUN 8 - 23 mg/dL 13 13 12   Creatinine 0.61 - 1.24 mg/dL 0.51(L) 0.57(L) 0.53(L)  Sodium 135 - 145 mmol/L 135 136 136  Potassium 3.5 - 5.1 mmol/L 4.1 4.7 4.1  Chloride 98 - 111 mmol/L 101 99 100  CO2 22 - 32 mmol/L 25 26 28   Calcium 8.9 - 10.3 mg/dL 8.0(L) 8.1(L) 7.9(L)    Intake/Output Summary (Last 24 hours) at 11/01/2019 0823 Last data filed at 11/01/2019 0523 Gross per 24 hour  Intake 1088.83 ml  Output 1300 ml  Net -211.17 ml    Current Facility-Administered Medications:  .  0.9 %  sodium chloride infusion, , Intravenous, PRN, Judith Part, MD, Stopped at 10/25/19 1416 .  acetaminophen (TYLENOL) 160 MG/5ML solution 650 mg, 650 mg, Per Tube, Q4H PRN, Ashok Pall, MD, 650 mg at 10/28/19 0034 .  [DISCONTINUED] acetaminophen (TYLENOL) tablet 650 mg, 650 mg, Oral, Q4H PRN **OR** acetaminophen (TYLENOL) suppository 650 mg, 650 mg, Rectal, Q4H PRN, Judith Part, MD .  amLODipine (NORVASC) tablet 10 mg, 10 mg, Per Tube, Daily, Agarwala, Ravi, MD, 10 mg at 10/30/19 1214 .  artificial tears (LACRILUBE) ophthalmic ointment, , Right Eye, Q4H, Judith Part, MD, Given at 11/01/19 [phone] .  bethanechol (URECHOLINE) tablet 10 mg, 10 mg, Per Tube, TID, Kipp Brood, MD, 10 mg at 10/31/19 2137 .  chlorhexidine (PERIDEX) 0.12 % solution 15 mL, 15 mL, Mouth Rinse, BID, Darrel Gloss, Joyice Faster, MD, 15 mL at 10/31/19 2143 .  Chlorhexidine Gluconate Cloth 2 % PADS 6 each, 6 each, Topical, Daily, Judith Part, MD, 6 each at 10/31/19 1027 .  docusate (COLACE) 50 MG/5ML liquid 100 mg, 100 mg, Per Tube, Daily, Merlene Laughter F, NP, 100 mg at 10/31/19 1026 .  feeding supplement (OSMOLITE 1.2 CAL) liquid 1,000 mL, 1,000 mL, Per Tube, Continuous, Judith Part, MD, Last Rate: 70 mL/hr at 11/01/19 0225, Rate Verify at 11/01/19 0225 .  feeding supplement (PRO-STAT SUGAR FREE 64) liquid 30 mL, 30 mL, Per Tube, Daily, Ottie Tillery A, MD, 30 mL at 10/31/19 1027 .  free water 200 mL, 200 mL, Per Tube, Q4H, Michaelpaul Apo A, MD, 200 mL at 11/01/19 0400 .  heparin injection 5,000 Units, 5,000 Units, Subcutaneous, Q8H, Agarwala, Einar Grad, MD, 5,000 Units at 11/01/19 0525 .  HYDROcodone-acetaminophen (NORCO/VICODIN) 5-325 MG per tablet 1 tablet, 1 tablet, Per Tube, Q4H PRN, Ashok Pall, MD, 1 tablet at 10/30/19 1214 .  HYDROmorphone (DILAUDID) injection 0.5 mg, 0.5 mg, Intravenous, Q3H PRN, Judith Part, MD, 0.5 mg at 10/19/19 0931 .  ipratropium-albuterol (  DUONEB) 0.5-2.5 (3) MG/3ML nebulizer solution 3 mL, 3 mL, Nebulization, Q6H PRN, Sebastyan Snodgrass A, MD .  labetalol (NORMODYNE) injection 10-40 mg, 10-40 mg, Intravenous, Q10 min PRN, Judith Part, MD, 20 mg at 10/18/19 1404 .  lisinopril (ZESTRIL) tablet 5 mg, 5 mg, Per Tube, Daily, Cedar Roseman, Joyice Faster, MD, 5 mg at 10/30/19 1214 .  MEDLINE mouth rinse, 15 mL, Mouth Rinse, q12n4p, Adena Sima A, MD, 15 mL at 10/31/19 1536 .  multivitamin liquid 15 mL, 15 mL, Per Tube, Daily, Enya Bureau, Joyice Faster, MD, 15 mL at 10/31/19 1028 .  ondansetron (ZOFRAN) tablet 4 mg, 4 mg, Oral, Q4H PRN **OR** ondansetron (ZOFRAN) injection 4 mg, 4 mg, Intravenous, Q4H PRN, Cailan General A, MD .  pantoprazole  sodium (PROTONIX) 40 mg/20 mL oral suspension 40 mg, 40 mg, Per Tube, Daily, Veronda Gabor, Joyice Faster, MD, 40 mg at 10/31/19 1027 .  polyethylene glycol (MIRALAX / GLYCOLAX) packet 17 g, 17 g, Oral, Daily, Deola Rewis A, MD, 17 g at 10/31/19 1540 .  promethazine (PHENERGAN) tablet 12.5-25 mg, 12.5-25 mg, Per Tube, Q4H PRN, Judith Part, MD .  QUEtiapine (SEROQUEL) tablet 25 mg, 25 mg, Per Tube, QHS, Rigoberto Noel, MD, 25 mg at 10/31/19 2137   Physical Exam: On TC, awake/interactive, FCx4, +R injection / corneal edema stable, stable R cranial nerve palsies  Assessment & Plan: 64 y.o. 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 MRI with good debulking of tumor, pre-post op measurements show decrease in tumor volume from 19.7cc-->4.3cc. 4/22 progressive obtundation, CTH with post-op resection cavity hemorrhage not present on MRI, brainstem compression and developing obstructive hydrocephalus, taken back to OR for hematoma removal and hemostasis. Unfortunately, as expected, post-op cranial nerve dysfunction after hematoma evacuation despite normal intra-op stimulation. Post-op CTH with improved mass effect on brainstem, slowly improving ventricular size with some IVH, 4/26 CTH stable ventricular size, hemorrhage resolving, 4/26 intubated for aspiration / PNA, 4/27 trach, 4/30 s/p PEG, 5/2 fever, trach aspirate Cx +S auerus, completed 7d course of cefazolin. 5/13 failed repeat MBS  Neuro -R eye keratopathy s/p ABx gtt, resolved. Continue lacrilube gtt to right eye q4h -final path cystic schwannoma  Cardiopulm -no active pulm issues, on TC, intermittent capping / PMV with supervision -tachycardic overnight, will check basic labs and give a fluid bolus  FENGI -s/p PEG, on TF & 200q4 FWF -foley replaced for retention -failed repeat MBS, continue NPO w/ meds & TF via PEG   Heme/ID -s/p completed Tx for MSSA PNA, afebrile, no issues -macrocytosis, on  MVI  PPx/Dispo -SNF discharge pending, per SW won't be accepted until 30d post-trach, which would be 5/27 -SCDs/TEDs, SQH  Judith Part  11/01/19 8:23 AM

## 2019-11-02 LAB — BASIC METABOLIC PANEL
Anion gap: 6 (ref 5–15)
Anion gap: 7 (ref 5–15)
BUN: 36 mg/dL — ABNORMAL HIGH (ref 8–23)
BUN: 30 mg/dL — ABNORMAL HIGH (ref 8–23)
CO2: 25 mmol/L (ref 22–32)
CO2: 25 mmol/L (ref 22–32)
Calcium: 7.9 mg/dL — ABNORMAL LOW (ref 8.9–10.3)
Calcium: 7.9 mg/dL — ABNORMAL LOW (ref 8.9–10.3)
Chloride: 106 mmol/L (ref 98–111)
Chloride: 103 mmol/L (ref 98–111)
Creatinine, Ser: 0.69 mg/dL (ref 0.61–1.24)
Creatinine, Ser: 0.62 mg/dL (ref 0.61–1.24)
GFR calc Af Amer: >60 mL/min (ref >60)
GFR calc Af Amer: >60 mL/min (ref >60)
GFR calc non Af Amer: >60 mL/min (ref >60)
GFR calc non Af Amer: >60 mL/min (ref >60)
Glucose, Bld: 140 mg/dL — ABNORMAL HIGH (ref 70–99)
Glucose, Bld: 122 mg/dL — ABNORMAL HIGH (ref 70–99)
Potassium: 4.7 mmol/L (ref 3.5–5.1)
Potassium: 4.4 mmol/L (ref 3.5–5.1)
Sodium: 137 mmol/L (ref 135–145)
Sodium: 135 mmol/L (ref 135–145)

## 2019-11-02 LAB — GLUCOSE, CAPILLARY
Glucose-Capillary: 124 mg/dL — ABNORMAL HIGH (ref 70–99)
Glucose-Capillary: 130 mg/dL — ABNORMAL HIGH (ref 70–99)

## 2019-11-02 LAB — CBC
HCT: 21.4 % — ABNORMAL LOW (ref 39.0–52.0)
HCT: 19.6 % — ABNORMAL LOW (ref 39.0–52.0)
Hemoglobin: 7 g/dL — ABNORMAL LOW (ref 13.0–17.0)
Hemoglobin: 6.3 g/dL — CL (ref 13.0–17.0)
MCH: 33.5 pg (ref 26.0–34.0)
MCH: 33.2 pg (ref 26.0–34.0)
MCHC: 32.7 g/dL (ref 30.0–36.0)
MCHC: 32.1 g/dL (ref 30.0–36.0)
MCV: 102.4 fL — ABNORMAL HIGH (ref 80.0–100.0)
MCV: 103.2 fL — ABNORMAL HIGH (ref 80.0–100.0)
Platelets: 349 10*3/uL (ref 150–400)
Platelets: 313 10*3/uL (ref 150–400)
RBC: 2.09 MIL/uL — ABNORMAL LOW (ref 4.22–5.81)
RBC: 1.9 MIL/uL — ABNORMAL LOW (ref 4.22–5.81)
RDW: 15.2 % (ref 11.5–15.5)
RDW: 15.4 % (ref 11.5–15.5)
WBC: 17.9 10*3/uL — ABNORMAL HIGH (ref 4.0–10.5)
WBC: 15.8 10*3/uL — ABNORMAL HIGH (ref 4.0–10.5)
nRBC: 0 % (ref 0.0–0.2)
nRBC: 0.1 % (ref 0.0–0.2)

## 2019-11-02 LAB — HEMOGLOBIN AND HEMATOCRIT, BLOOD
HCT: 26.9 % — ABNORMAL LOW (ref 39.0–52.0)
Hemoglobin: 8.5 g/dL — ABNORMAL LOW (ref 13.0–17.0)

## 2019-11-02 MED ORDER — SODIUM CHLORIDE 0.9% IV SOLUTION: Freq: Once | INTRAVENOUS | Status: AC

## 2019-11-02 MED ORDER — SODIUM CHLORIDE 0.9 % IV BOLUS
1000.0000 mL | Freq: Once | INTRAVENOUS | Status: AC
  Administered 2019-11-02: 1000 mL via INTRAVENOUS

## 2019-11-02 NOTE — Progress Notes (Signed)
CRITICAL VALUE ALERT  Critical Value: Hemoglobin - 6.3   Date & Time Notied:  11/02/2019 0620  Provider Notified: Glenford Peers   Orders Received/Actions taken: 2 units PRBC ordered.

## 2019-11-02 NOTE — Progress Notes (Signed)
Patient ID: Casey Richard, male   DOB: 19-Jun-1956, 64 y.o.   MRN: [id number] No change in neurologic exam.  He arouses easily.  He follows commands.  He is on trach collar.  Unfortunately his hemoglobin is down to 6.3 this morning.  We will transfuse with 2 units of packed red blood cells and ask medicine to consult on him.

## 2019-11-02 NOTE — Progress Notes (Signed)
Pt transfused with two units of RBC. Hbg up to 8.5 from 6.3. Pt tolerated well with adverse effects noted. Katherina Right RN

## 2019-11-02 NOTE — Progress Notes (Signed)
2328 - Pt BP continues to be soft and pt is tachy. On call provider notified. Received order for STAT labs and 1L bolus. Will continue to monitor.

## 2019-11-03 LAB — BASIC METABOLIC PANEL
Anion gap: 8 (ref 5–15)
BUN: 14 mg/dL (ref 8–23)
CO2: 27 mmol/L (ref 22–32)
Calcium: 8.4 mg/dL — ABNORMAL LOW (ref 8.9–10.3)
Chloride: 102 mmol/L (ref 98–111)
Creatinine, Ser: 0.54 mg/dL — ABNORMAL LOW (ref 0.61–1.24)
GFR calc Af Amer: >60 mL/min (ref >60)
GFR calc non Af Amer: >60 mL/min (ref >60)
Glucose, Bld: 110 mg/dL — ABNORMAL HIGH (ref 70–99)
Potassium: 4.2 mmol/L (ref 3.5–5.1)
Sodium: 137 mmol/L (ref 135–145)

## 2019-11-03 LAB — CBC
HCT: 28.1 % — ABNORMAL LOW (ref 39.0–52.0)
Hemoglobin: 8.9 g/dL — ABNORMAL LOW (ref 13.0–17.0)
MCH: 30.5 pg (ref 26.0–34.0)
MCHC: 31.7 g/dL (ref 30.0–36.0)
MCV: 96.2 fL (ref 80.0–100.0)
Platelets: 260 10*3/uL (ref 150–400)
RBC: 2.92 MIL/uL — ABNORMAL LOW (ref 4.22–5.81)
RDW: 17.2 % — ABNORMAL HIGH (ref 11.5–15.5)
WBC: 11.4 10*3/uL — ABNORMAL HIGH (ref 4.0–10.5)
nRBC: 0.3 % — ABNORMAL HIGH (ref 0.0–0.2)

## 2019-11-03 LAB — TYPE AND SCREEN
ABO/RH(D): O POS
Antibody Screen: NEGATIVE
Unit division: 0
Unit division: 0

## 2019-11-03 LAB — URINE CULTURE: Culture: >=100000 — AB

## 2019-11-03 LAB — BPAM RBC
Blood Product Expiration Date: 202106142359
Blood Product Expiration Date: 202106142359
ISSUE DATE / TIME: 202105150948
ISSUE DATE / TIME: 202105151213
Unit Type and Rh: 5100
Unit Type and Rh: 5100

## 2019-11-03 NOTE — Progress Notes (Signed)
NEUROSURGERY PROGRESS NOTE  No real change in neurologic status. FC and on trach collar. Hbg has improved up to 8.9 since receiving two units of blood yesterday. BP much improved today.  Temp:  [97.7 F (36.5 C)-98.7 F (37.1 C)] 97.7 F (36.5 C) (05/16 0738) Pulse Rate:  [88-102] 92 (05/16 0813) Resp:  [12-16] 12 (05/16 0813) BP: (93-120)/(54-82) 120/78 (05/16 0813) SpO2:  [98 %-100 %] 100 % (05/16 0813) FiO2 (%):  [21 %] 21 % (05/16 0813) Weight:  [59.9 kg] 59.9 kg (05/16 0625)    Casey Chiquito, NP 11/03/2019 9:15 AM

## 2019-11-04 LAB — BASIC METABOLIC PANEL
Anion gap: 8 (ref 5–15)
BUN: 15 mg/dL (ref 8–23)
CO2: 25 mmol/L (ref 22–32)
Calcium: 8.2 mg/dL — ABNORMAL LOW (ref 8.9–10.3)
Chloride: 102 mmol/L (ref 98–111)
Creatinine, Ser: 0.58 mg/dL — ABNORMAL LOW (ref 0.61–1.24)
GFR calc Af Amer: >60 mL/min (ref >60)
GFR calc non Af Amer: >60 mL/min (ref >60)
Glucose, Bld: 112 mg/dL — ABNORMAL HIGH (ref 70–99)
Potassium: 4.2 mmol/L (ref 3.5–5.1)
Sodium: 135 mmol/L (ref 135–145)

## 2019-11-04 LAB — CBC
HCT: 28.9 % — ABNORMAL LOW (ref 39.0–52.0)
Hemoglobin: 9.2 g/dL — ABNORMAL LOW (ref 13.0–17.0)
MCH: 30.8 pg (ref 26.0–34.0)
MCHC: 31.8 g/dL (ref 30.0–36.0)
MCV: 96.7 fL (ref 80.0–100.0)
Platelets: 247 10*3/uL (ref 150–400)
RBC: 2.99 MIL/uL — ABNORMAL LOW (ref 4.22–5.81)
RDW: 16.8 % — ABNORMAL HIGH (ref 11.5–15.5)
WBC: 12.5 10*3/uL — ABNORMAL HIGH (ref 4.0–10.5)
nRBC: 0 % (ref 0.0–0.2)

## 2019-11-04 NOTE — Progress Notes (Signed)
Cleaned patient's inner cannula, changed trach collar, dressing and placed PMV on.  Patient tolerated well sat 100% on RA HR 96.  Patient speaking clearly, good cough and will continue to monitor patient.  Casey Bushy, NP present with trial and we determined he could go as long as tolerated with valve in place.

## 2019-11-04 NOTE — Progress Notes (Signed)
Neurosurgery Service Progress Note  Subjective: NAE ON  Objective: Vitals:   11/04/19 0048 11/04/19 0131 11/04/19 0808 11/04/19 0810  BP: 100/66 100/66 97/72 97/72   Pulse: (!) 104 100 95 (!) 106  Resp: 11 14 15 19   Temp: 98.1 F (36.7 C) 98.2 F (36.8 C) 98 F (36.7 C)   TempSrc: Oral  Axillary   SpO2: 98% 97% 98% 99%  Weight:      Height:       Temp (24hrs), Avg:98 F (36.7 C), Min:97.7 F (36.5 C), Max:98.2 F (36.8 C)  CBC Latest Ref Rng & Units 11/04/2019 11/03/2019 11/02/2019  WBC 4.0 - 10.5 K/uL 12.5(H) 11.4(H) -  Hemoglobin 13.0 - 17.0 g/dL 9.2(L) 8.9(L) 8.5(L)  Hematocrit 39.0 - 52.0 % 28.9(L) 28.1(L) 26.9(L)  Platelets 150 - 400 K/uL 247 260 -   BMP Latest Ref Rng & Units 11/04/2019 11/03/2019 11/02/2019  Glucose 70 - 99 mg/dL 112(H) 110(H) 122(H)  BUN 8 - 23 mg/dL 15 14 30(H)  Creatinine 0.61 - 1.24 mg/dL 0.58(L) 0.54(L) 0.62  Sodium 135 - 145 mmol/L 135 137 135  Potassium 3.5 - 5.1 mmol/L 4.2 4.2 4.4  Chloride 98 - 111 mmol/L 102 102 103  CO2 22 - 32 mmol/L 25 27 25   Calcium 8.9 - 10.3 mg/dL 8.2(L) 8.4(L) 7.9(L)    Intake/Output Summary (Last 24 hours) at 11/04/2019 1028 Last data filed at 11/04/2019 0616 Gross per 24 hour  Intake --  Output 1925 ml  Net -1925 ml    Current Facility-Administered Medications:  .  0.9 %  sodium chloride infusion, , Intravenous, PRN, Judith Part, MD, Stopped at 10/25/19 1416 .  acetaminophen (TYLENOL) 160 MG/5ML solution 650 mg, 650 mg, Per Tube, Q4H PRN, Ashok Pall, MD, 650 mg at 10/28/19 0034 .  [DISCONTINUED] acetaminophen (TYLENOL) tablet 650 mg, 650 mg, Oral, Q4H PRN **OR** acetaminophen (TYLENOL) suppository 650 mg, 650 mg, Rectal, Q4H PRN, Judith Part, MD .  amLODipine (NORVASC) tablet 10 mg, 10 mg, Per Tube, Daily, Agarwala, Ravi, MD, 10 mg at 10/30/19 1214 .  artificial tears (LACRILUBE) ophthalmic ointment, , Right Eye, Q4H, Judith Part, MD, Given at 11/04/19 [phone] .  bethanechol (URECHOLINE)  tablet 10 mg, 10 mg, Per Tube, TID, Kipp Brood, MD, 10 mg at 11/04/19 0918 .  chlorhexidine (PERIDEX) 0.12 % solution 15 mL, 15 mL, Mouth Rinse, BID, Ally Knodel, Joyice Faster, MD, 15 mL at 11/04/19 0918 .  Chlorhexidine Gluconate Cloth 2 % PADS 6 each, 6 each, Topical, Daily, Judith Part, MD, 6 each at 11/04/19 0919 .  docusate (COLACE) 50 MG/5ML liquid 100 mg, 100 mg, Per Tube, Daily, Merlene Laughter F, NP, 100 mg at 11/04/19 0917 .  feeding supplement (OSMOLITE 1.2 CAL) liquid 1,000 mL, 1,000 mL, Per Tube, Continuous, Judith Part, MD, Last Rate: 70 mL/hr at 11/03/19 2046, 1,000 mL at 11/03/19 2046 .  feeding supplement (PRO-STAT SUGAR FREE 64) liquid 30 mL, 30 mL, Per Tube, Daily, Judith Part, MD, 30 mL at 11/04/19 0918 .  free water 200 mL, 200 mL, Per Tube, Q4H, Alaija Ruble A, MD, 200 mL at 11/04/19 0800 .  heparin injection 5,000 Units, 5,000 Units, Subcutaneous, Q8H, Agarwala, Ravi, MD, 5,000 Units at 11/03/19 2039 .  HYDROcodone-acetaminophen (NORCO/VICODIN) 5-325 MG per tablet 1 tablet, 1 tablet, Per Tube, Q4H PRN, Ashok Pall, MD, 1 tablet at 10/30/19 1214 .  HYDROmorphone (DILAUDID) injection 0.5 mg, 0.5 mg, Intravenous, Q3H PRN, Judith Part, MD, 0.5 mg at  10/19/19 0931 .  ipratropium-albuterol (DUONEB) 0.5-2.5 (3) MG/3ML nebulizer solution 3 mL, 3 mL, Nebulization, Q6H PRN, Daimion Adamcik A, MD .  labetalol (NORMODYNE) injection 10-40 mg, 10-40 mg, Intravenous, Q10 min PRN, Judith Part, MD, 20 mg at 10/18/19 1404 .  lisinopril (ZESTRIL) tablet 5 mg, 5 mg, Per Tube, Daily, Thailyn Khalid, Joyice Faster, MD, 5 mg at 11/03/19 1035 .  MEDLINE mouth rinse, 15 mL, Mouth Rinse, q12n4p, Meshawn Oconnor A, MD, 15 mL at 11/03/19 1715 .  multivitamin liquid 15 mL, 15 mL, Per Tube, Daily, Judith Part, MD, 15 mL at 11/04/19 [id number] .  ondansetron (ZOFRAN) tablet 4 mg, 4 mg, Oral, Q4H PRN **OR** ondansetron (ZOFRAN) injection 4 mg, 4 mg, Intravenous, Q4H PRN,  Ulis Kaps A, MD .  pantoprazole sodium (PROTONIX) 40 mg/20 mL oral suspension 40 mg, 40 mg, Per Tube, Daily, Judith Part, MD, 40 mg at 11/04/19 0918 .  polyethylene glycol (MIRALAX / GLYCOLAX) packet 17 g, 17 g, Oral, Daily, Judith Part, MD, 17 g at 11/04/19 0917 .  promethazine (PHENERGAN) tablet 12.5-25 mg, 12.5-25 mg, Per Tube, Q4H PRN, Judith Part, MD .  QUEtiapine (SEROQUEL) tablet 25 mg, 25 mg, Per Tube, QHS, Rigoberto Noel, MD, 25 mg at 11/03/19 2040   Physical Exam: On TC, awake/interactive, FCx4, corneal edema resolved OD, stable R diffuse cranial nerve palsies  Assessment & Plan: 64 y.o. 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 MRI with good debulking of tumor, pre-post op measurements show decrease in tumor volume from 19.7cc-->4.3cc. 4/22 progressive obtundation, CTH with post-op resection cavity hemorrhage not present on MRI, brainstem compression and developing obstructive hydrocephalus, taken back to OR for hematoma removal and hemostasis. Unfortunately, as expected, post-op cranial nerve dysfunction after hematoma evacuation despite normal intra-op stimulation. Post-op CTH with improved mass effect on brainstem, slowly improving ventricular size with some IVH, 4/26 CTH stable ventricular size, hemorrhage resolving, 4/26 intubated for aspiration / PNA, 4/27 trach, 4/30 s/p PEG, 5/2 fever, trach aspirate Cx +S auerus, completed 7d course of cefazolin. 5/13 failed repeat MBS  Neuro -R eye keratopathy s/p ABx gtt, resolved. Continue lacrilube gtt to right eye q4h -final path cystic schwannoma  Cardiopulm -no active pulm issues, on TC, intermittent capping / PMV with supervision -tachycardia resolved with correcting Hb  FENGI -s/p PEG, on TF & 200q4 FWF -foley replaced for retention -failed repeat MBS, continue NPO w/ meds & TF via PEG   Heme/ID -s/p completed Tx for MSSA PNA, afebrile, no issues -macrocytosis, on MVI -Hb  drop with +Guaiac, on PPI, Hb stable  PPx/Dispo -SNF discharge pending, per SW won't be accepted until 30d post-trach, which would be 5/27 -SCDs/TEDs, SQH  Joyice Faster Jhordan Kinter  11/04/19 10:28 AM

## 2019-11-04 NOTE — Progress Notes (Signed)
2002: Pt's brother called for updates. Answered all questions.   2030: Mr. and Mrs. Dymond called for updates. Answered all questions.

## 2019-11-04 NOTE — Progress Notes (Signed)
  Speech Language Pathology Treatment: Dysphagia;Passy Muir Speaking valve  Patient Details Name: Casey Richard MRN: [id number] DOB: 04/21/1956 Today's Date: 11/04/2019 Time: 1215-1300 SLP Time Calculation (min) (ACUTE ONLY): 45 min  Assessment / Plan / Recommendation Clinical Impression  Pt's PMV was donned for ~40 minutes without overt s/s of intolerance. He does need assist with placing it on his trach hub and after placement he reported feeling like it was "tight." No back pressure or changes in VS were noted though. SLP removed and replaced PMV and pt said he did not notice any difference in this "tightness." Pt also reported concerns about his speech intelligibility, and SLP provided education about strategies including over articulation and pausing between words. He does still have a tendency to speak quickly despite cues, and it impacts intelligibility even at the word to short phrase level.  Pt also completed effortful swallows with trials of ice chips after oral care. SLP cued pt to practice mindfulness of sensory input from ice chip, presence of wet vocal quality, and initiation of swallow. Minimal movement is noted to palpation when he tries to swallow, and he struggles with initiating any movement, but when he does, he can self-identify well. Pt coughed and used the yankauer with Min cues when wet vocal quality was noted. Would continue to offer ice chips after oral care to provide opportunities to utilize his swallowing musculature.    HPI HPI: Pt is a 64 y.o. male admitted with dizziness and gait instability.  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 MRI with good debulking of tumor, pre-post op measurements showed decrease in tumor volume from 19.7cc-->4.3cc. 4/22 presented with progressive obtundation, CTH with post-op resection cavity  hemorrhage not present on MRI, brainstem compression and developing obstructive hydrocephalus, taken back to OR 4/22 for hematoma removal and hemostasis. Per surgery notes, unfortunately, as expected, there was post-op cranial nerve dysfunction after hematoma evacuation despite normal intra-op stimulation. Post-op CTH with improved mass effect on brainstem, slowly improving ventricular size with some IVH. ETT 4/26 Trach 4/27. PEG placed on 4/30. MBS 4/23 with severe dysphagia.       SLP Plan  Continue with current plan of care       Recommendations  Diet recommendations: NPO;Other(comment)(ice chips after oral care) Medication Administration: Via alternative means      Patient may use Passy-Muir Speech Valve: Intermittently with supervision;During all therapies with supervision PMSV Supervision: Full         Oral Care Recommendations: Oral care QID Follow up Recommendations: Skilled Nursing facility SLP Visit Diagnosis: Dysphagia, oropharyngeal phase (R13.12);Aphonia (R49.1) Plan: Continue with current plan of care       GO                 Osie Bond., M.A. [place] [facility] Pager [phone] Office [phone]  11/04/2019, 2:01 PM

## 2019-11-04 NOTE — Progress Notes (Signed)
Physical Therapy Treatment Patient Details Name: Casey Richard MRN: [id number] DOB: 07/25/1955 Today's Date: 11/04/2019    History of Present Illness 64 y/o male admitted secondary to dizziness, weight loss, and gait instability. 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  MRI confirmed 2 large brain masses. S/p right crani for tumor excision on 10/07/19. PMH includes CKD and complete heart block s/p pacemaker. Neuro status changes s/p evacuation of post operative hematoma on 10/10/19. Patient then with acute hypoxic respiratory distress. CXR with bibasilar opacities with right greater then left, concerning for aspiration, re-intubated 4/26, s/p tracheostomy 4/27.  Trach collar on 4/29.  PEG tube 10/18/19.    PT Comments    Pt with improved ambulation tolerance this date but continues to demonstrate significant balance impairment, impulsivity, impaired co-ordination, impaired sequencing and decreased insight to safety and deficits. Continue to recommend SNF upon d/c. Acute PT to cont to follow.    Follow Up Recommendations  SNF     Equipment Recommendations  Rolling walker with 5" wheels;3in1 (PT);Wheelchair (measurements PT);Wheelchair cushion (measurements PT)    Recommendations for Other Services       Precautions / Restrictions Precautions Precautions: Fall;Other (comment) Precaution Comments: trach, PMV, G-tube, cannot hear out of R ear Restrictions Weight Bearing Restrictions: No    Mobility  Bed Mobility Overal bed mobility: Needs Assistance Bed Mobility: Sit to Supine       Sit to supine: Min assist   General bed mobility comments: verbal cues to sitt up hight in bed, minA for LE management  Transfers Overall transfer level: Needs assistance Equipment used: 1 person hand held assist Transfers: Sit to/from Stand Sit to Stand: Min assist;From elevated surface         General transfer comment:  minA for line management and safety due to impulsivity, minA to stedy during transition of hands  Ambulation/Gait Ambulation/Gait assistance: Mod assist Gait Distance (Feet): 60 Feet Assistive device: Rolling walker (2 wheeled) Gait Pattern/deviations: Step-to pattern;Drifts right/left;Staggering right;Staggering left Gait velocity: reduc Gait velocity interpretation: <1.31 ft/sec, indicative of household ambulator General Gait Details: modA for walker management, pt with 1 standing rest break, shaky requiring minA to steady pt, modA during turning with RW as pt with tendency to step outside of walker and trip over walker leg   Stairs             Wheelchair Mobility    Modified Rankin (Stroke Patients Only)       Balance Overall balance assessment: Needs assistance Sitting-balance support: Single extremity supported;Feet supported Sitting balance-Leahy Scale: Fair Sitting balance - Comments: close supervision Postural control: Right lateral lean Standing balance support: Bilateral upper extremity supported Standing balance-Leahy Scale: Fair Standing balance comment: requires external support for upright posture                            Cognition Arousal/Alertness: Awake/alert Behavior During Therapy: Impulsive Overall Cognitive Status: Impaired/Different from baseline Area of Impairment: Attention;Memory;Following commands;Safety/judgement;Awareness;Problem solving                   Current Attention Level: Selective Memory: Decreased short-term memory Following Commands: Follows one step commands with increased time;Follows one step commands consistently;Follows multi-step commands inconsistently Safety/Judgement: Decreased awareness of safety;Decreased awareness of deficits   Problem Solving: Slow processing;Decreased initiation;Difficulty sequencing;Requires verbal cues;Requires tactile cues General Comments: pt reporting glasses not to be  helping but covering  R eye to help. pt quick to move despite all his tubes and lines. verbal cues for safety with walker management      Exercises      General Comments General comments (skin integrity, edema, etc.): trach with PMV, VSS      Pertinent Vitals/Pain Pain Assessment: Faces Faces Pain Scale: No hurt    Home Living                      Prior Function            PT Goals (current goals can now be found in the care plan section) Progress towards PT goals: Progressing toward goals    Frequency    Min 2X/week      PT Plan Current plan remains appropriate    Co-evaluation              AM-PAC PT "6 Clicks" Mobility   Outcome Measure  Help needed turning from your back to your side while in a flat bed without using bedrails?: A Little Help needed moving from lying on your back to sitting on the side of a flat bed without using bedrails?: A Little Help needed moving to and from a bed to a chair (including a wheelchair)?: A Lot Help needed standing up from a chair using your arms (e.g., wheelchair or bedside chair)?: A Little Help needed to walk in hospital room?: A Lot Help needed climbing 3-5 steps with a railing? : Total 6 Click Score: 14    End of Session Equipment Utilized During Treatment: Gait belt;Oxygen Activity Tolerance: Patient tolerated treatment well Patient left: in bed;with call bell/phone within reach;with bed alarm set;with nursing/sitter in room Nurse Communication: Mobility status PT Visit Diagnosis: Muscle weakness (generalized) (M62.81);Difficulty in walking, not elsewhere classified (R26.2)     Time: [id number] PT Time Calculation (min) (ACUTE ONLY): 19 min  Charges:  $Gait Training: 8-22 mins                     Kittie Plater, PT, DPT Acute Rehabilitation Services Pager #: [phone] Office #: [phone]    Berline Lopes 11/04/2019, 2:07 PM

## 2019-11-04 NOTE — Progress Notes (Addendum)
NAME:  Casey Richard, MRN:  [id number], DOB:  1955-09-13, LOS: 60 ADMISSION DATE:  09/27/2019, CONSULTATION DATE:  10/13/2019 REFERRING MD:  Dr. Zada Finders, CHIEF COMPLAINT:  Acute Hypoxic Respiratory Distress   History of present illness   64 year old male presents to ED on 4/9 with gait instability, memory loss, and falls. CT Head with hypodense lesion with mass effect on pons, right cerebellum. Treated with steroids. 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 On 4/22 patient with lethargy. Taken for CT head which revealed post-operative hemorrhage with brainstem compression. Taken to OR for evacuation. 4/24 patient with acute hypoxic respiratory distress. CXR with bibasilar opacities with right greater then left, concerning for aspiration. PCCM consulted.  Course complicated by prolonged mechanical ventilation requiring tracheostomy 4/27  Past Medical History   Past Medical History:  Diagnosis Date  . Cardiac arrest ([place])   . CHB (complete heart block) ([place])   . 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 Dysrhythmia 1/13   bradycardia, VF, cardiac arrest/complete heart block with pacer inserted/LOV Dr Sallyanne Kuster 07/21/11 with interrogation and anesthesia guideline order on chart. Chest x ray 1/13 EPIC,, TEE and [facility]  1/13 EPIC  . Memory loss, short term    from fall with heart block 1/13  . Nonsustained ventricular tachycardia ([place]) 02/07/2014  . Presence of permanent cardiac pacemaker 07/15/2011   [employer]tr[employer] Revo  . Seasonal allergies   . Shortness of breath      Significant Hospital Events   4/9 > Admit 4/19 > Resection  4/22 > Evacuation of hemorrhage  4/25 > PCCM Consulted  4/27 > Trached   Significant Diagnostic Tests:  CT Head 4/09 >  1. No acute intracranial or calvarial findings. 2. New large cystic mass at the right cerebellar pontine angle with mass effect on the pons and cerebellum, but no  resulting hydrocephalus. 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 Cystic neoplasm less likely. Recommend further evaluation with MRI of the brain without and with contrast (if possible; the patient has a pacemaker) and non emergent neuro surgical consultation. 3. No evidence of acute cervical spine fracture, traumatic subluxation or static signs of instability. 4. Mildly progressive cervical spondylosis   MR Brain 4/12 >  1. 4.7 x 3.4 x 4.1 cm multi-cystic right cerebellopontine angle mass with associated abnormal enhancement extending into the right internal auditory canal. 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 Significant associated mass effect upon the right cerebellopontine and cerebellomedullary angle with partial effacement of the fourth ventricle and mild adjacent parenchymal edema. No hydrocephalus. 2. A 17 x 14 mm hypoenhancing mass within the left aspect of the sella has increased in size as compared MRI 02/15/2012. 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 Correlate with relevant laboratory values. 3. Mild chronic small vessel ischemic, progressed as compared to 02/15/2012. 4. Mild ethmoid sinus mucosal thickening. Small bilateral maxillary sinus mucous retention cysts.  CT Head 4/22 >  Right occipital craniectomy and cranioplasty related 2 debulking surgery for cystic mass of the right CP angle region. Gross measurements of the lesion today are 3.2 x 5.3 x 4.5 cm. Internal hemorrhage within the residual cystic lesion. Mass-effect upon the fourth ventricle with subsequent enlargement of the lateral and third ventricles consistent with mild hydrocephalus. Small amount of blood layering dependently in the  occipital horns. Small amount of subdural blood along the inferior tentorium as well as dissecting  inferiorly to and through the foramen magnum to the C1 level with mild mass effect upon the brainstem.   CXR 4//24 > 1. Bibasilar opacities, right greater than left may represent aspiration or pneumonia. There may be associated effusion on the right. Recommend clinical correlation and short-term follow-up imaging to ensure resolution. 2. No other acute abnormalities.  Head CT 4/26 >: 1. Interval improvement in hemorrhage in the tumor bed right cerebellar pontine angle . 2. Mild ventricular hemorrhage and mild hydrocephalus unchanged. Subarachnoid hemorrhage bilaterally appears unchanged on the right but improved on the left. No new hemorrhage.  Micro Data:  Sputum 4/25 > negative  Blood 4/25 > negative  Urine culture 4/9 > Staph Epidermis > sensitive to cipro or bactrium,  5/1 resp: mod MSSA 5/1 blood: ngtd 5/1 urine: neg  Antimicrobials:  Zosyn 4/25 >> 4/30 vanc 5/2  Interim history/subjective:  Continues to tolerate trach collar  Objective   Blood pressure 102/76, pulse 100, temperature 98.2 F (36.8 C), temperature source Oral, resp. rate 16, height 5\' 10"  (1.778 m), weight 59.9 kg, SpO2 98 %.    FiO2 (%):  [21 %] 21 %   Intake/Output Summary (Last 24 hours) at 11/04/2019 1454 Last data filed at 11/04/2019 1321 Gross per 24 hour  Intake no documentation  Output 1950 ml  Net -1950 ml   Filed Weights   11/01/19 0524 11/02/19 0500 11/03/19 0625  Weight: 62.8 kg 61.5 kg 59.9 kg    Examination: General chronically ill appearing white male no acute distress HENT Number 4 cuffless trach in place. Phonation strong. 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 He did tolerate capping trial briefly but cannot clear secretions effectively and requires sxn. I spoke w/ neuro-surg. The hope is that his ability to control upper airway may improve w/ time but could be months. Because of this he is not a candidate for decannulation for the near future. We will need to work on this in collaboration w/ Neuro surg in the out pt setting   Plan Cont routine trach care Asp and reflux precautions PMV supervised.   We will cont to see weekly  Erick Colace ACNP-BC [place] Pager # [phone] OR # [phone] if no answer    11/04/2019   Patient seen today 11/04/2019 Offers no new complaints Still with significant secretions requiring suctioning  Stable hemodynamics On exam, rhonchi S1-S2 appreciated Bowel sounds appreciated  Chest x-ray 11/01/2019 shows improving infiltrative process at the right base  Chronic respiratory failure He does have a PMV in place at present and seems to be tolerating this okay Continue current trach care  Aspiration precautions  We will continue to follow with you on a weekly basis  Sherrilyn Rist, MD [place] PCCM Pager: [phone]

## 2019-11-05 LAB — CBC
HCT: 29.5 % — ABNORMAL LOW (ref 39.0–52.0)
Hemoglobin: 9.4 g/dL — ABNORMAL LOW (ref 13.0–17.0)
MCH: 30.8 pg (ref 26.0–34.0)
MCHC: 31.9 g/dL (ref 30.0–36.0)
MCV: 96.7 fL (ref 80.0–100.0)
Platelets: 246 10*3/uL (ref 150–400)
RBC: 3.05 MIL/uL — ABNORMAL LOW (ref 4.22–5.81)
RDW: 16.6 % — ABNORMAL HIGH (ref 11.5–15.5)
WBC: 12.2 10*3/uL — ABNORMAL HIGH (ref 4.0–10.5)
nRBC: 0.2 % (ref 0.0–0.2)

## 2019-11-05 LAB — BASIC METABOLIC PANEL
Anion gap: 8 (ref 5–15)
BUN: 16 mg/dL (ref 8–23)
CO2: 28 mmol/L (ref 22–32)
Calcium: 8.4 mg/dL — ABNORMAL LOW (ref 8.9–10.3)
Chloride: 99 mmol/L (ref 98–111)
Creatinine, Ser: 0.62 mg/dL (ref 0.61–1.24)
GFR calc Af Amer: >60 mL/min (ref >60)
GFR calc non Af Amer: >60 mL/min (ref >60)
Glucose, Bld: 103 mg/dL — ABNORMAL HIGH (ref 70–99)
Potassium: 4.4 mmol/L (ref 3.5–5.1)
Sodium: 135 mmol/L (ref 135–145)

## 2019-11-05 NOTE — Plan of Care (Signed)
  Problem: Health Behavior/Discharge Planning: Goal: Ability to manage health-related needs will improve Outcome: Progressing  Patient able to self-suction, able to verbalize needs for position changes and extra blankets.  Problem: Activity: Goal: Risk for activity intolerance will decrease Outcome: Progressing  Patient denies SOB when transferring to chair.  Problem: Nutrition: Goal: Adequate nutrition will be maintained Outcome: Progressing  Minimal residual on peg tube placement check.

## 2019-11-05 NOTE — Progress Notes (Signed)
Neurosurgery Service Progress Note  Subjective: NAE ON  Objective: Vitals:   11/05/19 0500 11/05/19 0710 11/05/19 0851 11/05/19 1130  BP:  (!) 101/55    Pulse:  93 99 98  Resp:  14  (!) 22  Temp:  98.9 F (37.2 C)    TempSrc:  Axillary    SpO2:  99%  98%  Weight: 59.9 kg     Height:       Temp (24hrs), Avg:98.3 F (36.8 C), Min:97.8 F (36.6 C), Max:98.9 F (37.2 C)  CBC Latest Ref Rng & Units 11/05/2019 11/04/2019 11/03/2019  WBC 4.0 - 10.5 K/uL 12.2(H) 12.5(H) 11.4(H)  Hemoglobin 13.0 - 17.0 g/dL 9.4(L) 9.2(L) 8.9(L)  Hematocrit 39.0 - 52.0 % 29.5(L) 28.9(L) 28.1(L)  Platelets 150 - 400 K/uL 246 247 260   BMP Latest Ref Rng & Units 11/05/2019 11/04/2019 11/03/2019  Glucose 70 - 99 mg/dL 103(H) 112(H) 110(H)  BUN 8 - 23 mg/dL 16 15 14   Creatinine 0.61 - 1.24 mg/dL 0.62 0.58(L) 0.54(L)  Sodium 135 - 145 mmol/L 135 135 137  Potassium 3.5 - 5.1 mmol/L 4.4 4.2 4.2  Chloride 98 - 111 mmol/L 99 102 102  CO2 22 - 32 mmol/L 28 25 27   Calcium 8.9 - 10.3 mg/dL 8.4(L) 8.2(L) 8.4(L)    Intake/Output Summary (Last 24 hours) at 11/05/2019 1233 Last data filed at 11/05/2019 0502 Gross per 24 hour  Intake 630 ml  Output 1800 ml  Net -1170 ml    Current Facility-Administered Medications:  .  0.9 %  sodium chloride infusion, , Intravenous, PRN, Judith Part, MD, Stopped at 10/25/19 1416 .  acetaminophen (TYLENOL) 160 MG/5ML solution 650 mg, 650 mg, Per Tube, Q4H PRN, Ashok Pall, MD, 650 mg at 10/28/19 0034 .  [DISCONTINUED] acetaminophen (TYLENOL) tablet 650 mg, 650 mg, Oral, Q4H PRN **OR** acetaminophen (TYLENOL) suppository 650 mg, 650 mg, Rectal, Q4H PRN, Judith Part, MD .  amLODipine (NORVASC) tablet 10 mg, 10 mg, Per Tube, Daily, Agarwala, Ravi, MD, 10 mg at 11/05/19 1029 .  artificial tears (LACRILUBE) ophthalmic ointment, , Right Eye, Q4H, Judith Part, MD, Given at 11/05/19 1025 .  bethanechol (URECHOLINE) tablet 10 mg, 10 mg, Per Tube, TID, Agarwala,  Ravi, MD, 10 mg at 11/05/19 1029 .  chlorhexidine (PERIDEX) 0.12 % solution 15 mL, 15 mL, Mouth Rinse, BID, Sula Fetterly A, MD, 15 mL at 11/05/19 1028 .  Chlorhexidine Gluconate Cloth 2 % PADS 6 each, 6 each, Topical, Daily, Judith Part, MD, 6 each at 11/04/19 0919 .  docusate (COLACE) 50 MG/5ML liquid 100 mg, 100 mg, Per Tube, Daily, Merlene Laughter F, NP, 100 mg at 11/05/19 1029 .  feeding supplement (OSMOLITE 1.2 CAL) liquid 1,000 mL, 1,000 mL, Per Tube, Continuous, Judith Part, MD, Last Rate: 70 mL/hr at 11/03/19 2046, 1,000 mL at 11/03/19 2046 .  feeding supplement (PRO-STAT SUGAR FREE 64) liquid 30 mL, 30 mL, Per Tube, Daily, Deylan Canterbury A, MD, 30 mL at 11/05/19 1027 .  free water 200 mL, 200 mL, Per Tube, Q4H, Gini Caputo A, MD, 200 mL at 11/05/19 0400 .  heparin injection 5,000 Units, 5,000 Units, Subcutaneous, Q8H, Kipp Brood, MD, 5,000 Units at 11/05/19 [id number] .  HYDROcodone-acetaminophen (NORCO/VICODIN) 5-325 MG per tablet 1 tablet, 1 tablet, Per Tube, Q4H PRN, Ashok Pall, MD, 1 tablet at 10/30/19 1214 .  HYDROmorphone (DILAUDID) injection 0.5 mg, 0.5 mg, Intravenous, Q3H PRN, Judith Part, MD, 0.5 mg at 10/19/19 0931 .  ipratropium-albuterol (DUONEB) 0.5-2.5 (3) MG/3ML nebulizer solution 3 mL, 3 mL, Nebulization, Q6H PRN, Ladarrian Asencio A, MD .  labetalol (NORMODYNE) injection 10-40 mg, 10-40 mg, Intravenous, Q10 min PRN, Judith Part, MD, 20 mg at 10/18/19 1404 .  lisinopril (ZESTRIL) tablet 5 mg, 5 mg, Per Tube, Daily, Dynasia Kercheval, Joyice Faster, MD, 5 mg at 11/05/19 1029 .  MEDLINE mouth rinse, 15 mL, Mouth Rinse, q12n4p, Sava Proby A, MD, 15 mL at 11/04/19 1600 .  multivitamin liquid 15 mL, 15 mL, Per Tube, Daily, Symeon Puleo, Joyice Faster, MD, 15 mL at 11/05/19 1027 .  ondansetron (ZOFRAN) tablet 4 mg, 4 mg, Oral, Q4H PRN **OR** ondansetron (ZOFRAN) injection 4 mg, 4 mg, Intravenous, Q4H PRN, Khilee Hendricksen A, MD .  pantoprazole  sodium (PROTONIX) 40 mg/20 mL oral suspension 40 mg, 40 mg, Per Tube, Daily, Maizey Menendez, Joyice Faster, MD, 40 mg at 11/05/19 1028 .  polyethylene glycol (MIRALAX / GLYCOLAX) packet 17 g, 17 g, Oral, Daily, Caedmon Louque A, MD, 17 g at 11/05/19 1028 .  promethazine (PHENERGAN) tablet 12.5-25 mg, 12.5-25 mg, Per Tube, Q4H PRN, Judith Part, MD .  QUEtiapine (SEROQUEL) tablet 25 mg, 25 mg, Per Tube, QHS, Rigoberto Noel, MD, 25 mg at 11/04/19 2109   Physical Exam: On TC, awake/interactive, FCx4, corneal edema resolved OD, stable R diffuse cranial nerve palsies  Assessment & Plan: 64 y.o. 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 MRI with good debulking of tumor, pre-post op measurements show decrease in tumor volume from 19.7cc-->4.3cc. 4/22 progressive obtundation, CTH with post-op resection cavity hemorrhage not present on MRI, brainstem compression and developing obstructive hydrocephalus, taken back to OR for hematoma removal and hemostasis. Unfortunately, as expected, post-op cranial nerve dysfunction after hematoma evacuation despite normal intra-op stimulation. Post-op CTH with improved mass effect on brainstem, slowly improving ventricular size with some IVH, 4/26 CTH stable ventricular size, hemorrhage resolving, 4/26 intubated for aspiration / PNA, 4/27 trach, 4/30 s/p PEG, 5/2 fever, trach aspirate Cx +S auerus, completed 7d course of cefazolin. 5/13 failed repeat MBS  Neuro -R eye keratopathy s/p ABx gtt, resolved. Continue lacrilube gtt to right eye q4h -final path cystic schwannoma  Cardiopulm -no active pulm issues, on TC, intermittent capping / PMV with supervision -tachycardia resolved with correcting Hb  FENGI -s/p PEG, on TF & 200q4 FWF -foley replaced for retention -failed repeat MBS, continue NPO w/ meds & TF via PEG   Heme/ID -s/p completed Tx for MSSA PNA, afebrile, no issues -macrocytosis, on MVI -Hb drop with +Guaiac, on PPI, Hb stable  today  PPx/Dispo -SNF discharge pending, per SW won't be accepted until 30d post-trach, which would be 5/27 -SCDs/TEDs, SQH  Judith Part  11/05/19 12:33 PM

## 2019-11-05 NOTE — Progress Notes (Signed)
Occupational Therapy Treatment Patient Details Name: Casey Richard MRN: [id number] DOB: 1955-11-24 Today's Date: 11/05/2019    History of present illness 64 y/o male admitted secondary to dizziness, weight loss, and gait instability. 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  MRI confirmed 2 large brain masses. S/p right crani for tumor excision on 10/07/19. PMH includes CKD and complete heart block s/p pacemaker. Neuro status changes s/p evacuation of post operative hematoma on 10/10/19. Patient then with acute hypoxic respiratory distress. CXR with bibasilar opacities with right greater then left, concerning for aspiration, re-intubated 4/26, s/p tracheostomy 4/27.  Trach collar on 4/29.  PEG tube 10/18/19.   OT comments  Pt making slow but steady progress with adls and functional mobility. Pt slightly less impulsive today with transfers. Pt with ataxia with most movements and poor vision affecting safety with all adls.  Focused on sit to stand x3 to work on balance and functional mobility in prep to do more adls in standing.  Will continue to focus on tasks in standing as well as a more formal visual assessment to see if other methods can be used to assist with vision.   Follow Up Recommendations  SNF;Supervision/Assistance - 24 hour    Equipment Recommendations  None recommended by OT    Recommendations for Other Services      Precautions / Restrictions Precautions Precautions: Fall;Other (comment) Precaution Comments: trach, PMV, G-tube, cannot hear out of R ear Required Braces or Orthoses: Other Brace Other Brace: abdominal binder for multiple tubes and low BP. Restrictions Weight Bearing Restrictions: No       Mobility Bed Mobility Overal bed mobility: Needs Assistance Bed Mobility: Supine to Sit     Supine to sit: HOB elevated;Min assist     General bed mobility comments: cues to use bedrails instead  of pulling on therapist.  Transfers Overall transfer level: Needs assistance Equipment used: 1 person hand held assist Transfers: Sit to/from Stand;Stand Pivot Transfers Sit to Stand: Min assist Stand pivot transfers: Min assist       General transfer comment: minA for line management and safety due to impulsivity, minA to stedy during transition of hands    Balance Overall balance assessment: Needs assistance Sitting-balance support: Single extremity supported;Feet supported Sitting balance-Leahy Scale: Fair Sitting balance - Comments: close supervision   Standing balance support: Bilateral upper extremity supported Standing balance-Leahy Scale: Poor Standing balance comment: Feel pt is dependent on external support.  Would not want pt on feet without walker or +1 assist.                           ADL either performed or assessed with clinical judgement   ADL Overall ADL's : Needs assistance/impaired Eating/Feeding: NPO   Grooming: Oral care;Wash/dry face;Wash/dry hands;Minimal assistance;Sitting;Cueing for compensatory techniques Grooming Details (indicate cue type and reason): Pt sat in chair to brush teeth with just toothbrush; no water or paste.  Pt has difficulty getting toothbrush into his mouth poking cheek x2 when attempting to put in his mouth.  Pt washed face and hands with impulsivity.             Lower Body Dressing: Moderate assistance;Sit to/from stand;Cueing for compensatory techniques Lower Body Dressing Details (indicate cue type and reason): Pt sat on EOB and is able to cross legs to donn socks and pants.  Vision is very limiting and pt attempt to  do all tasks very quickly which makes him unsafe. Toilet Transfer: Minimal Production assistant, radio Details (indicate cue type and reason): Min A for balance and power up Toileting- Clothing Manipulation and Hygiene: Minimal assistance;Sit to/from stand       Functional mobility  during ADLs: Moderate assistance General ADL Comments: Pt making slow progress with adls. Decreased vision and impulsivity tend to be biggest limitations.       Vision       Perception     Praxis      Cognition Arousal/Alertness: Awake/alert Behavior During Therapy: Impulsive Overall Cognitive Status: Impaired/Different from baseline Area of Impairment: Attention;Memory;Following commands;Safety/judgement;Awareness;Problem solving;Orientation                 Orientation Level: Time Current Attention Level: Selective Memory: Decreased short-term memory Following Commands: Follows one step commands with increased time;Follows one step commands consistently;Follows multi-step commands inconsistently Safety/Judgement: Decreased awareness of safety;Decreased awareness of deficits Awareness: Intellectual Problem Solving: Slow processing;Decreased initiation;Difficulty sequencing;Requires verbal cues;Requires tactile cues General Comments: Pt does not like wearing glassess that are blocked on one side to assist with vision. Pt continues to move quickly and impulsively with all adls.         Exercises     Shoulder Instructions       General Comments Pt progressing slowly.  Limited most by impulsivity and vision/balance.    Pertinent Vitals/ Pain       Pain Assessment: Faces Faces Pain Scale: Hurts a little bit Pain Location: stomach Pain Descriptors / Indicators: Grimacing Pain Intervention(s): Limited activity within patient's tolerance;Monitored during session;Repositioned  Home Living                                          Prior Functioning/Environment              Frequency  Min 2X/week        Progress Toward Goals  OT Goals(current goals can now be found in the care plan section)  Progress towards OT goals: Progressing toward goals  Acute Rehab OT Goals Patient Stated Goal: to eat Time For Goal Achievement: 11/12/19 Potential  to Achieve Goals: Good ADL Goals Pt Will Perform Grooming: with min assist;standing Pt Will Perform Lower Body Bathing: sit to/from stand;with min guard assist Pt Will Perform Lower Body Dressing: with min guard assist;sit to/from stand Pt Will Transfer to Toilet: with min assist;ambulating;bedside commode Pt Will Perform Toileting - Clothing Manipulation and hygiene: with min assist;sitting/lateral leans;sit to/from stand Additional ADL Goal #1: Pt will demonstrate selective attention during ADLs with Min cues  Plan      Co-evaluation                 AM-PAC OT "6 Clicks" Daily Activity     Outcome Measure   Help from another person eating meals?: Total Help from another person taking care of personal grooming?: A Lot Help from another person toileting, which includes using toliet, bedpan, or urinal?: A Lot Help from another person bathing (including washing, rinsing, drying)?: A Lot Help from another person to put on and taking off regular upper body clothing?: A Lot Help from another person to put on and taking off regular lower body clothing?: A Lot 6 Click Score: 11    End of Session Equipment Utilized During Treatment: Oxygen  OT Visit Diagnosis: Unsteadiness on feet (R26.81);Cognitive communication deficit (R41.841)  Activity Tolerance Patient tolerated treatment well   Patient Left in chair;with call bell/phone within reach;with chair alarm set   Nurse Communication Mobility status;Precautions        Time: [id number] OT Time Calculation (min): 28 min  Charges: OT General Charges $OT Visit: 1 Visit OT Treatments $Self Care/Home Management : 23-37 mins   Glenford Peers 11/05/2019, 12:02 PM

## 2019-11-06 LAB — BASIC METABOLIC PANEL
Anion gap: 8 (ref 5–15)
BUN: 14 mg/dL (ref 8–23)
CO2: 30 mmol/L (ref 22–32)
Calcium: 8.4 mg/dL — ABNORMAL LOW (ref 8.9–10.3)
Chloride: 97 mmol/L — ABNORMAL LOW (ref 98–111)
Creatinine, Ser: 0.6 mg/dL — ABNORMAL LOW (ref 0.61–1.24)
GFR calc Af Amer: >60 mL/min (ref >60)
GFR calc non Af Amer: >60 mL/min (ref >60)
Glucose, Bld: 116 mg/dL — ABNORMAL HIGH (ref 70–99)
Potassium: 4.3 mmol/L (ref 3.5–5.1)
Sodium: 135 mmol/L (ref 135–145)

## 2019-11-06 LAB — CBC
HCT: 29.8 % — ABNORMAL LOW (ref 39.0–52.0)
Hemoglobin: 9.5 g/dL — ABNORMAL LOW (ref 13.0–17.0)
MCH: 31.3 pg (ref 26.0–34.0)
MCHC: 31.9 g/dL (ref 30.0–36.0)
MCV: 98 fL (ref 80.0–100.0)
Platelets: 265 10*3/uL (ref 150–400)
RBC: 3.04 MIL/uL — ABNORMAL LOW (ref 4.22–5.81)
RDW: 16.9 % — ABNORMAL HIGH (ref 11.5–15.5)
WBC: 10.9 10*3/uL — ABNORMAL HIGH (ref 4.0–10.5)
nRBC: 0 % (ref 0.0–0.2)

## 2019-11-06 MED ORDER — OSMOLITE 1.2 CAL PO LIQD
1000.0000 mL | ORAL | Status: DC
  Administered 2019-11-06 – 2019-11-14 (×9): 1000 mL
  Filled 2019-11-06 (×18): qty 1000

## 2019-11-06 MED ORDER — PRO-STAT SUGAR FREE PO LIQD
30.0000 mL | Freq: Two times a day (BID) | ORAL | Status: DC
  Administered 2019-11-06 – 2019-11-15 (×18): 30 mL
  Filled 2019-11-06 (×18): qty 30

## 2019-11-06 NOTE — TOC Progression Note (Signed)
Transition of Care [facility]) - Progression Note    Patient Details  Name: Casey Richard MRN: [id number] Date of Birth: 1955-11-08  [facility] CM/SW [place], [place] Phone Number: 11/06/2019, 1:47 PM  Clinical Narrative:     CSW sent message to [facility] - requested they follow and informed patient will have trach for 30 days next week.   Thurmond Butts, MSW, [place] Clinical Social Worker   Expected Discharge Plan: Skilled Nursing Facility Barriers to Discharge: Ship broker, Continued Medical Work up(SNF require 30 day or more before admit with trach)  Expected Discharge Plan and Services Expected Discharge Plan: [place] In-house Referral: Clinical Social Work     Living arrangements for the past 2 months: Single Family Home                                       Social Determinants of Health (SDOH) Interventions    Readmission Risk Interventions No flowsheet data found.

## 2019-11-06 NOTE — Progress Notes (Signed)
Nutrition Follow-up  DOCUMENTATION CODES:   Severe malnutrition in context of chronic illness  INTERVENTION:  Osmolite 1.2 formula via PEG at goal rate of 75 ml/hr  Provide 30 ml Prostat BID per tube.   Free water flushes of 200 ml every 4 hours per tube (MD to adjust as appropriate)  Tube feeding regimen to provide 2360 kcal (100% of needs), 130 grams of protein, and 2676 ml free water.   NUTRITION DIAGNOSIS:   Severe Malnutrition related to chronic illness as evidenced by severe muscle depletion, severe fat depletion, percent weight loss, energy intake < or equal to 75% for > or equal to 1 month; ongoing  GOAL:   Patient will meet greater than or equal to 90% of their needs; met with TF  MONITOR:   PO intake, Supplement acceptance, Weight trends, Labs  REASON FOR ASSESSMENT:   Consult Enteral/tube feeding initiation and management  ASSESSMENT:   Pt with a PMH significant for complete heart block s/p PPM who presented to the hospital for worsening dizziness, gait instability, and weight loss. MRI revealed 2 large brain masses.  4/19 s/p RIGHT CRANIOTOMY TUMOR EXCISION 4/22 s/p repeat crani for evacuation of hematoma after tumor excision  4/23 failed swallow, Cortrak placed; tip gastric  4/26 intubated 4/27 trach 4/30 PEG placed    Pt continues on trach collar and NPO status. Pt has been tolerating his tube feedings well via PEG. Pt with gradual weight loss. RD to increase tube feeding and modify orders to aid in increased caloric and protein needs as well as in prevention of further weight loss. Labs and medications reviewed.   Diet Order:   Diet Order            Diet NPO time specified Except for: Ice Chips  Diet effective midnight              EDUCATION NEEDS:   Not appropriate for education at this time  Skin:  Skin Assessment: Skin Integrity Issues: Skin Integrity Issues:: Incisions Incisions: head  Last BM:  5/15  Height:   Ht Readings from  Last 1 Encounters:  10/14/19 '5\' 10"'  (1.778 m)    Weight:   Wt Readings from Last 1 Encounters:  11/06/19 59.9 kg   BMI:  Body mass index is 18.95 kg/m.  Estimated Nutritional Needs:   Kcal:  2100-2400  Protein:  110-130 grams  Fluid:  >2L/d  Corrin Parker, MS, RD, LDN RD pager number/after hours weekend pager number on Amion.

## 2019-11-06 NOTE — Progress Notes (Signed)
Physical Therapy Treatment Patient Details Name: Casey Richard MRN: [id number] DOB: [birth year] Today's Date: 11/06/2019    History of Present Illness 64 y/o male admitted secondary to dizziness, weight loss, and gait instability. 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  MRI confirmed 2 large brain masses. S/p right crani for tumor excision on 10/07/19. PMH includes CKD and complete heart block s/p pacemaker. Neuro status changes s/p evacuation of post operative hematoma on 10/10/19. Patient then with acute hypoxic respiratory distress. CXR with bibasilar opacities with right greater then left, concerning for aspiration, re-intubated 4/26, s/p tracheostomy 4/27.  Trach collar on 4/29.  PEG tube 10/18/19.    PT Comments    Pt tolerates treatment well. Pt continues to demonstrate significant imbalance when out of bed and mobilizing despite BUE support of RW. Pt with reduced eye opening during today's session, reporting dry eyes, likely also contributing to impaired balance. Pt demonstrates limited awareness of deficits, continuing to ambulate at times despite increase in losses of balance. Pt will continue to benefit from gait and balance training to reduce falls risk and caregiver burden. PT continues to recommend SNF placement at this time due to high falls risk.   Follow Up Recommendations  SNF     Equipment Recommendations  Rolling walker with 5" wheels;3in1 (PT);Wheelchair (measurements PT);Wheelchair cushion (measurements PT)    Recommendations for Other Services       Precautions / Restrictions Precautions Precautions: Fall;Other (comment) Precaution Comments: trach, PMV, G-tube, cannot hear out of R ear Required Braces or Orthoses: Other Brace Other Brace: abdominal binder for multiple tubes and low BP. Restrictions Weight Bearing Restrictions: No    Mobility  Bed Mobility Overal bed mobility: Needs  Assistance Bed Mobility: Supine to Sit;Sit to Supine     Supine to sit: Min assist Sit to supine: Min guard      Transfers Overall transfer level: Needs assistance   Transfers: Sit to/from Stand Sit to Stand: Min assist            Ambulation/Gait Ambulation/Gait assistance: Mod assist Gait Distance (Feet): 80 Feet Assistive device: Rolling walker (2 wheeled) Gait Pattern/deviations: Step-to pattern;Step-through pattern;Decreased stride length;Drifts right/left;Staggering right;Staggering left Gait velocity: reduced Gait velocity interpretation: <1.8 ft/sec, indicate of risk for recurrent falls General Gait Details: pt with short step to gait initially requiring PT cues to increase step length to step through gait. Pt with increased sway and with multiple LOB in all directions requiring modA to correct. Pt with limited ability to maintain eyes open during ambulation reporting dry eyes, likely contributing to imbalance.   Stairs             Wheelchair Mobility    Modified Rankin (Stroke Patients Only)       Balance Overall balance assessment: Needs assistance Sitting-balance support: Single extremity supported;Feet supported Sitting balance-Leahy Scale: Fair Sitting balance - Comments: close supervision at the edge of bed   Standing balance support: Bilateral upper extremity supported Standing balance-Leahy Scale: Poor Standing balance comment: minA-modA for static standing balance due to increased postural sway in all directions                            Cognition Arousal/Alertness: Awake/alert Behavior During Therapy: Impulsive Overall Cognitive Status: Impaired/Different from baseline Area of Impairment: Attention;Memory;Following commands;Safety/judgement;Awareness;Problem solving;Orientation  Orientation Level: Disoriented to;Time Current Attention Level: Selective Memory: Decreased recall of precautions;Decreased  short-term memory Following Commands: Follows one step commands consistently;Follows multi-step commands with increased time Safety/Judgement: Decreased awareness of safety;Decreased awareness of deficits Awareness: Intellectual Problem Solving: Slow processing;Requires verbal cues;Requires tactile cues;Difficulty sequencing General Comments: pt is impulsive and requires frequent cues to maintain safety.      Exercises      General Comments General comments (skin integrity, edema, etc.): pt on trach collar, 5L 28% FiO2 during session, sats reading at 70s intermittently during mobility but with unreliable pleth reading. Pt without significant increase in work of breathing.      Pertinent Vitals/Pain Pain Assessment: Faces Faces Pain Scale: No hurt    Home Living                      Prior Function            PT Goals (current goals can now be found in the care plan section) Acute Rehab PT Goals Patient Stated Goal: to eat Progress towards PT goals: Progressing toward goals    Frequency    Min 2X/week      PT Plan Current plan remains appropriate    Co-evaluation              AM-PAC PT "6 Clicks" Mobility   Outcome Measure  Help needed turning from your back to your side while in a flat bed without using bedrails?: None Help needed moving from lying on your back to sitting on the side of a flat bed without using bedrails?: A Little Help needed moving to and from a bed to a chair (including a wheelchair)?: A Lot Help needed standing up from a chair using your arms (e.g., wheelchair or bedside chair)?: A Little Help needed to walk in hospital room?: A Lot Help needed climbing 3-5 steps with a railing? : Total 6 Click Score: 15    End of Session Equipment Utilized During Treatment: Gait belt;Oxygen Activity Tolerance: Patient tolerated treatment well Patient left: in bed;with call bell/phone within reach;with bed alarm set Nurse Communication:  Mobility status PT Visit Diagnosis: Muscle weakness (generalized) (M62.81);Difficulty in walking, not elsewhere classified (R26.2)     Time: [id number] PT Time Calculation (min) (ACUTE ONLY): 28 min  Charges:  $Gait Training: 8-22 mins $Therapeutic Activity: 8-22 mins                     Zenaida Niece, PT, DPT Acute Rehabilitation Pager: [phone]    Zenaida Niece 11/06/2019, 3:45 PM

## 2019-11-06 NOTE — Progress Notes (Signed)
Neurosurgery Service Progress Note  Subjective: NAE ON  Objective: Vitals:   11/06/19 0500 11/06/19 0816 11/06/19 0821 11/06/19 0822  BP:  (!) 89/74  90/73  Pulse:  93 91 87  Resp:  17 12 14   Temp:  97.9 F (36.6 C)    TempSrc:  Axillary    SpO2:  99% 100% 98%  Weight: 59.9 kg     Height:       Temp (24hrs), Avg:98.1 F (36.7 C), Min:97.7 F (36.5 C), Max:98.5 F (36.9 C)  CBC Latest Ref Rng & Units 11/06/2019 11/05/2019 11/04/2019  WBC 4.0 - 10.5 K/uL 10.9(H) 12.2(H) 12.5(H)  Hemoglobin 13.0 - 17.0 g/dL 9.5(L) 9.4(L) 9.2(L)  Hematocrit 39.0 - 52.0 % 29.8(L) 29.5(L) 28.9(L)  Platelets 150 - 400 K/uL 265 246 247   BMP Latest Ref Rng & Units 11/06/2019 11/05/2019 11/04/2019  Glucose 70 - 99 mg/dL 116(H) 103(H) 112(H)  BUN 8 - 23 mg/dL 14 16 15   Creatinine 0.61 - 1.24 mg/dL 0.60(L) 0.62 0.58(L)  Sodium 135 - 145 mmol/L 135 135 135  Potassium 3.5 - 5.1 mmol/L 4.3 4.4 4.2  Chloride 98 - 111 mmol/L 97(L) 99 102  CO2 22 - 32 mmol/L 30 28 25   Calcium 8.9 - 10.3 mg/dL 8.4(L) 8.4(L) 8.2(L)    Intake/Output Summary (Last 24 hours) at 11/06/2019 0843 Last data filed at 11/06/2019 0600 Gross per 24 hour  Intake 767.67 ml  Output 2800 ml  Net -2032.33 ml    Current Facility-Administered Medications:  .  0.9 %  sodium chloride infusion, , Intravenous, PRN, Judith Part, MD, Stopped at 10/25/19 1416 .  acetaminophen (TYLENOL) 160 MG/5ML solution 650 mg, 650 mg, Per Tube, Q4H PRN, Ashok Pall, MD, 650 mg at 10/28/19 0034 .  [DISCONTINUED] acetaminophen (TYLENOL) tablet 650 mg, 650 mg, Oral, Q4H PRN **OR** acetaminophen (TYLENOL) suppository 650 mg, 650 mg, Rectal, Q4H PRN, Judith Part, MD .  amLODipine (NORVASC) tablet 10 mg, 10 mg, Per Tube, Daily, Agarwala, Ravi, MD, 10 mg at 11/05/19 1029 .  artificial tears (LACRILUBE) ophthalmic ointment, , Right Eye, Q4H, Judith Part, MD, 1 application at [phone] .  bethanechol (URECHOLINE) tablet 10 mg, 10 mg, Per  Tube, TID, Kipp Brood, MD, 10 mg at 11/05/19 2128 .  chlorhexidine (PERIDEX) 0.12 % solution 15 mL, 15 mL, Mouth Rinse, BID, Mystie Ormand, Joyice Faster, MD, 15 mL at 11/05/19 2128 .  Chlorhexidine Gluconate Cloth 2 % PADS 6 each, 6 each, Topical, Daily, Judith Part, MD, 6 each at 11/05/19 [phone] .  docusate (COLACE) 50 MG/5ML liquid 100 mg, 100 mg, Per Tube, Daily, Merlene Laughter F, NP, 100 mg at 11/05/19 1029 .  feeding supplement (OSMOLITE 1.2 CAL) liquid 1,000 mL, 1,000 mL, Per Tube, Continuous, Judith Part, MD, Last Rate: 70 mL/hr at 11/03/19 2046, 1,000 mL at 11/03/19 2046 .  feeding supplement (PRO-STAT SUGAR FREE 64) liquid 30 mL, 30 mL, Per Tube, Daily, Matilynn Dacey A, MD, 30 mL at 11/05/19 1027 .  free water 200 mL, 200 mL, Per Tube, Q4H, Chan Sheahan A, MD, 200 mL at 11/06/19 0400 .  heparin injection 5,000 Units, 5,000 Units, Subcutaneous, Q8H, Agarwala, Ravi, MD, 5,000 Units at 11/06/19 0559 .  HYDROcodone-acetaminophen (NORCO/VICODIN) 5-325 MG per tablet 1 tablet, 1 tablet, Per Tube, Q4H PRN, Ashok Pall, MD, 1 tablet at 10/30/19 1214 .  HYDROmorphone (DILAUDID) injection 0.5 mg, 0.5 mg, Intravenous, Q3H PRN, Judith Part, MD, 0.5 mg at 10/19/19 0931 .  ipratropium-albuterol (DUONEB) 0.5-2.5 (3) MG/3ML nebulizer solution 3 mL, 3 mL, Nebulization, Q6H PRN, Amanii Snethen A, MD .  labetalol (NORMODYNE) injection 10-40 mg, 10-40 mg, Intravenous, Q10 min PRN, Judith Part, MD, 20 mg at 10/18/19 1404 .  lisinopril (ZESTRIL) tablet 5 mg, 5 mg, Per Tube, Daily, Kaeya Schiffer, Joyice Faster, MD, 5 mg at 11/05/19 1029 .  MEDLINE mouth rinse, 15 mL, Mouth Rinse, q12n4p, Devaeh Amadi, Joyice Faster, MD, 15 mL at 11/05/19 1656 .  multivitamin liquid 15 mL, 15 mL, Per Tube, Daily, Joetta Delprado, Joyice Faster, MD, 15 mL at 11/05/19 1027 .  ondansetron (ZOFRAN) tablet 4 mg, 4 mg, Oral, Q4H PRN **OR** ondansetron (ZOFRAN) injection 4 mg, 4 mg, Intravenous, Q4H PRN, Marquies Wanat A,  MD .  pantoprazole sodium (PROTONIX) 40 mg/20 mL oral suspension 40 mg, 40 mg, Per Tube, Daily, Mariela Rex, Joyice Faster, MD, 40 mg at 11/05/19 1028 .  polyethylene glycol (MIRALAX / GLYCOLAX) packet 17 g, 17 g, Oral, Daily, Alvia Tory A, MD, 17 g at 11/05/19 1028 .  promethazine (PHENERGAN) tablet 12.5-25 mg, 12.5-25 mg, Per Tube, Q4H PRN, Judith Part, MD .  QUEtiapine (SEROQUEL) tablet 25 mg, 25 mg, Per Tube, QHS, Rigoberto Noel, MD, 25 mg at 11/05/19 2128   Physical Exam: On TC, awake/interactive, FCx4, corneal edema resolved OD, stable R diffuse cranial nerve palsies  Assessment & Plan: 64 y.o. 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 MRI with good debulking of tumor, pre-post op measurements show decrease in tumor volume from 19.7cc-->4.3cc. 4/22 progressive obtundation, CTH with post-op resection cavity hemorrhage not present on MRI, brainstem compression and developing obstructive hydrocephalus, taken back to OR for hematoma removal and hemostasis. Unfortunately, as expected, post-op cranial nerve dysfunction after hematoma evacuation despite normal intra-op stimulation. Post-op CTH with improved mass effect on brainstem, slowly improving ventricular size with some IVH, 4/26 CTH stable ventricular size, hemorrhage resolving, 4/26 intubated for aspiration / PNA, 4/27 trach, 4/30 s/p PEG, 5/2 fever, trach aspirate Cx +S auerus, completed 7d course of cefazolin. 5/13 failed repeat MBS, 5/16 Hb drop, +guaiac, 2U pRBC, resolved w/ PPI  Neuro -R eye keratopathy s/p ABx gtt, resolved. 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

## 2019-11-07 LAB — GLUCOSE, CAPILLARY: Glucose-Capillary: 104 mg/dL — ABNORMAL HIGH (ref 70–99)

## 2019-11-07 NOTE — Progress Notes (Signed)
Neurosurgery Service Progress Note  Subjective: NAE ON  Objective: Vitals:   11/07/19 0438 11/07/19 0500 11/07/19 0800 11/07/19 0846  BP:   110/70   Pulse:   92 96  Resp:   16 16  Temp: 97.9 F (36.6 C)  98 F (36.7 C)   TempSrc: Oral  Oral   SpO2:   98% 98%  Weight:  60.6 kg    Height:       Temp (24hrs), Avg:98.3 F (36.8 C), Min:97.9 F (36.6 C), Max:98.9 F (37.2 C)  CBC Latest Ref Rng & Units 11/06/2019 11/05/2019 11/04/2019  WBC 4.0 - 10.5 K/uL 10.9(H) 12.2(H) 12.5(H)  Hemoglobin 13.0 - 17.0 g/dL 9.5(L) 9.4(L) 9.2(L)  Hematocrit 39.0 - 52.0 % 29.8(L) 29.5(L) 28.9(L)  Platelets 150 - 400 K/uL 265 246 247   BMP Latest Ref Rng & Units 11/06/2019 11/05/2019 11/04/2019  Glucose 70 - 99 mg/dL 116(H) 103(H) 112(H)  BUN 8 - 23 mg/dL 14 16 15   Creatinine 0.61 - 1.24 mg/dL 0.60(L) 0.62 0.58(L)  Sodium 135 - 145 mmol/L 135 135 135  Potassium 3.5 - 5.1 mmol/L 4.3 4.4 4.2  Chloride 98 - 111 mmol/L 97(L) 99 102  CO2 22 - 32 mmol/L 30 28 25   Calcium 8.9 - 10.3 mg/dL 8.4(L) 8.4(L) 8.2(L)    Intake/Output Summary (Last 24 hours) at 11/07/2019 1013 Last data filed at 11/06/2019 2000 Gross per 24 hour  Intake --  Output 1100 ml  Net -1100 ml    Current Facility-Administered Medications:  .  0.9 %  sodium chloride infusion, , Intravenous, PRN, Judith Part, MD, Stopped at 10/25/19 1416 .  acetaminophen (TYLENOL) 160 MG/5ML solution 650 mg, 650 mg, Per Tube, Q4H PRN, Ashok Pall, MD, 650 mg at 10/28/19 0034 .  [DISCONTINUED] acetaminophen (TYLENOL) tablet 650 mg, 650 mg, Oral, Q4H PRN **OR** acetaminophen (TYLENOL) suppository 650 mg, 650 mg, Rectal, Q4H PRN, Judith Part, MD .  amLODipine (NORVASC) tablet 10 mg, 10 mg, Per Tube, Daily, Agarwala, Ravi, MD, 10 mg at 11/06/19 1021 .  artificial tears (LACRILUBE) ophthalmic ointment, , Right Eye, Q4H, Judith Part, MD, Given at 11/07/19 [phone] .  bethanechol (URECHOLINE) tablet 10 mg, 10 mg, Per Tube, TID, Kipp Brood, MD, 10 mg at 11/06/19 2135 .  chlorhexidine (PERIDEX) 0.12 % solution 15 mL, 15 mL, Mouth Rinse, BID, Wanda Rideout, Joyice Faster, MD, 15 mL at 11/06/19 2135 .  Chlorhexidine Gluconate Cloth 2 % PADS 6 each, 6 each, Topical, Daily, Judith Part, MD, 6 each at 11/06/19 0955 .  docusate (COLACE) 50 MG/5ML liquid 100 mg, 100 mg, Per Tube, Daily, Merlene Laughter F, NP, 100 mg at 11/06/19 1021 .  feeding supplement (OSMOLITE 1.2 CAL) liquid 1,000 mL, 1,000 mL, Per Tube, Continuous, Angelos Wasco, Joyice Faster, MD, Last Rate: 75 mL/hr at 11/07/19 0659, 1,000 mL at 11/07/19 0659 .  feeding supplement (PRO-STAT SUGAR FREE 64) liquid 30 mL, 30 mL, Per Tube, BID, Clemente Dewey A, MD, 30 mL at 11/06/19 2135 .  free water 200 mL, 200 mL, Per Tube, Q4H, Marquisa Salih A, MD, 200 mL at 11/07/19 0800 .  heparin injection 5,000 Units, 5,000 Units, Subcutaneous, Q8H, Agarwala, Ravi, MD, 5,000 Units at 11/07/19 0529 .  HYDROcodone-acetaminophen (NORCO/VICODIN) 5-325 MG per tablet 1 tablet, 1 tablet, Per Tube, Q4H PRN, Ashok Pall, MD, 1 tablet at 10/30/19 1214 .  HYDROmorphone (DILAUDID) injection 0.5 mg, 0.5 mg, Intravenous, Q3H PRN, Judith Part, MD, 0.5 mg at 10/19/19 0931 .  ipratropium-albuterol (DUONEB) 0.5-2.5 (3) MG/3ML nebulizer solution 3 mL, 3 mL, Nebulization, Q6H PRN, Mende Biswell A, MD .  labetalol (NORMODYNE) injection 10-40 mg, 10-40 mg, Intravenous, Q10 min PRN, Judith Part, MD, 20 mg at 10/18/19 1404 .  lisinopril (ZESTRIL) tablet 5 mg, 5 mg, Per Tube, Daily, Christa Fasig, Joyice Faster, MD, 5 mg at 11/06/19 1021 .  MEDLINE mouth rinse, 15 mL, Mouth Rinse, q12n4p, Yecenia Dalgleish, Joyice Faster, MD, 15 mL at 11/05/19 1656 .  multivitamin liquid 15 mL, 15 mL, Per Tube, Daily, Mickaela Starlin, Joyice Faster, MD, 15 mL at 11/06/19 1021 .  ondansetron (ZOFRAN) tablet 4 mg, 4 mg, Oral, Q4H PRN **OR** ondansetron (ZOFRAN) injection 4 mg, 4 mg, Intravenous, Q4H PRN, Ivor Kishi A, MD .  pantoprazole sodium  (PROTONIX) 40 mg/20 mL oral suspension 40 mg, 40 mg, Per Tube, Daily, Allen Egerton, Joyice Faster, MD, 40 mg at 11/06/19 1021 .  polyethylene glycol (MIRALAX / GLYCOLAX) packet 17 g, 17 g, Oral, Daily, Keane Martelli A, MD, 17 g at 11/06/19 1021 .  promethazine (PHENERGAN) tablet 12.5-25 mg, 12.5-25 mg, Per Tube, Q4H PRN, Judith Part, MD .  QUEtiapine (SEROQUEL) tablet 25 mg, 25 mg, Per Tube, QHS, Rigoberto Noel, MD, 25 mg at 11/06/19 2134   Physical Exam: On TC, awake/interactive, FCx4, corneal edema resolved OD, stable R diffuse cranial nerve palsies  Assessment & Plan: 64 y.o. 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 MRI with good debulking of tumor, pre-post op measurements show decrease in tumor volume from 19.7cc-->4.3cc. 4/22 progressive obtundation, CTH with post-op resection cavity hemorrhage not present on MRI, brainstem compression and developing obstructive hydrocephalus, taken back to OR for hematoma removal and hemostasis. Unfortunately, as expected, post-op cranial nerve dysfunction after hematoma evacuation despite normal intra-op stimulation. Post-op CTH with improved mass effect on brainstem, slowly improving ventricular size with some IVH, 4/26 CTH stable ventricular size, hemorrhage resolving, 4/26 intubated for aspiration / PNA, 4/27 trach, 4/30 s/p PEG, 5/2 fever, trach aspirate Cx +S auerus, completed 7d course of cefazolin. 5/13 failed repeat MBS, 5/16 Hb drop, +guaiac, 2U pRBC, resolved w/ PPI  Neuro -R eye keratopathy s/p ABx gtt, resolved. Continue lacrilube gtt to right eye q4h -final path cystic schwannoma  Cardiopulm -no active pulm issues, on TC, intermittent capping / PMV with supervision  FENGI -s/p PEG, on TF & 200q4 FWF -foley replaced for retention -failed repeat MBS, continue NPO w/ meds & TF via PEG   Heme/ID -s/p completed Tx for MSSA PNA, afebrile, no issues -Hb stable, leukocytosis improving, cont PPI, can d/c daily  labs  PPx/Dispo -SNF discharge pending, per SW won't be accepted until 30d post-trach, which is next week -SCDs/TEDs, SQH  Judith Part  11/07/19 10:13 AM

## 2019-11-07 NOTE — Progress Notes (Addendum)
[facility] office called and left message that pt seems more confused today. Receptionist stated she would give message to Dr. Zada Finders. Night shift RN stated that pt had some confusion during the night. Upon assessment this am pt has been A&Ox4, but does seem a little off. When asked, pt does say that he feels confused. Speech therapy also just worked with pt and agrees that he seems more confused today.  1115: Dr. Zada Finders returned page and said that pt seemed confused with him at baseline. No new orders. Will continue to monitor.   Justice Rocher, RN

## 2019-11-07 NOTE — Progress Notes (Signed)
  Speech Language Pathology Treatment: Dysphagia  Patient Details Name: Casey Richard MRN: [id number] DOB: May 20, 1956 Today's Date: 11/07/2019 Time: [id number] SLP Time Calculation (min) (ACUTE ONLY): 12 min  Assessment / Plan / Recommendation Clinical Impression  Session was limited today by pt's increased confusion. He is alert and cooperative but with decreased awareness. Despite repeated cues to try to swallow the ice chips and complete effortful swallows, pt would let the ice melt in his mouth and then suctioned it back out. At one point he brought the spoon to his mouth but never took the ice chip off it and did not show any awareness that this had happened. Minimal hyolaryngeal movement was noted x3 given Mod cues for dry swallows. RN notified about what appears to be a change in mentation compared to this SLP's last visit. PMV was placed for session but removed prior to SLP departure given cognitive status. Will continue to follow.    HPI HPI: Pt is a 64 y.o. male admitted with dizziness and gait instability.  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 MRI with good debulking of tumor, pre-post op measurements showed decrease in tumor volume from 19.7cc-->4.3cc. 4/22 presented with progressive obtundation, CTH with post-op resection cavity hemorrhage not present on MRI, brainstem compression and developing obstructive hydrocephalus, taken back to OR 4/22 for hematoma removal and hemostasis. Per surgery notes, unfortunately, as expected, there was post-op cranial nerve dysfunction after hematoma evacuation despite normal intra-op stimulation. Post-op CTH with improved mass effect on brainstem, slowly improving ventricular size with some IVH. ETT 4/26 Trach 4/27. PEG placed on 4/30. MBS 4/23 with severe dysphagia.       SLP Plan  Continue with  current plan of care       Recommendations  Diet recommendations: NPO;Other(comment)(few ice chips ) Medication Administration: Via alternative means      Patient may use Passy-Muir Speech Valve: Intermittently with supervision;During all therapies with supervision PMSV Supervision: Full MD: Please consider changing trach tube to : Smaller size;Cuffless         Oral Care Recommendations: Oral care QID Follow up Recommendations: Skilled Nursing facility SLP Visit Diagnosis: Dysphagia, oropharyngeal phase (R13.12);Aphonia (R49.1) Plan: Continue with current plan of care       GO                Osie Bond., M.A. [facility] Pager [phone] Office [phone]  11/07/2019, 11:30 AM

## 2019-11-08 LAB — COMPREHENSIVE METABOLIC PANEL
ALT: 34 U/L (ref 0–44)
AST: 17 U/L (ref 15–41)
Albumin: 2.3 g/dL — ABNORMAL LOW (ref 3.5–5.0)
Alkaline Phosphatase: 83 U/L (ref 38–126)
Anion gap: 8 (ref 5–15)
BUN: 19 mg/dL (ref 8–23)
CO2: 29 mmol/L (ref 22–32)
Calcium: 8.7 mg/dL — ABNORMAL LOW (ref 8.9–10.3)
Chloride: 101 mmol/L (ref 98–111)
Creatinine, Ser: 0.67 mg/dL (ref 0.61–1.24)
GFR calc Af Amer: >60 mL/min (ref >60)
GFR calc non Af Amer: >60 mL/min (ref >60)
Glucose, Bld: 124 mg/dL — ABNORMAL HIGH (ref 70–99)
Potassium: 4.4 mmol/L (ref 3.5–5.1)
Sodium: 138 mmol/L (ref 135–145)
Total Bilirubin: 0.7 mg/dL (ref 0.3–1.2)
Total Protein: 5.9 g/dL — ABNORMAL LOW (ref 6.5–8.1)

## 2019-11-08 LAB — TROPONIN I (HIGH SENSITIVITY)
Troponin I (High Sensitivity): 5 ng/L (ref <18)
Troponin I (High Sensitivity): 5 ng/L (ref <18)

## 2019-11-08 NOTE — Progress Notes (Signed)
Physical Therapy Treatment Patient Details Name: Casey Richard MRN: [id number] DOB: 1955/07/13 Today's Date: 11/08/2019    History of Present Illness 64 y/o male admitted secondary to dizziness, weight loss, and gait instability. 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  MRI confirmed 2 large brain masses. S/p right crani for tumor excision on 10/07/19. PMH includes CKD and complete heart block s/p pacemaker. Neuro status changes s/p evacuation of post operative hematoma on 10/10/19. Patient then with acute hypoxic respiratory distress. CXR with bibasilar opacities with right greater then left, concerning for aspiration, re-intubated 4/26, s/p tracheostomy 4/27.  Trach collar on 4/29.  PEG tube 10/18/19.    PT Comments    Pt tolerated treatment well despite reports of feeling "yucky" today. Pt continues to demonstrate significant balance deviations with multiple losses of balance to left side during session. Pt with light sensitivity during session and often maintains one or both eyes closed during activity, exacerbating balance deviations. Pt does demonstrate improved cognition this session with more appropriate speech, however he still remains altered at this time. Pt will continue to benefit from PT POC to improve balance and reduce falls risk. Pt will benefit from more neuromuscular re-education and balance exercise in the following sessions to improve static and dynamic standing balance.   Follow Up Recommendations  SNF     Equipment Recommendations  Rolling walker with 5" wheels;3in1 (PT);Wheelchair (measurements PT);Wheelchair cushion (measurements PT)    Recommendations for Other Services       Precautions / Restrictions Precautions Precautions: Fall;Other (comment) Precaution Comments: trach, PMV, G-tube, cannot hear out of R ear Required Braces or Orthoses: Other Brace Other Brace: abdominal binder for  multiple tubes and low BP. Restrictions Weight Bearing Restrictions: No    Mobility  Bed Mobility Overal bed mobility: Needs Assistance Bed Mobility: Supine to Sit;Sit to Supine     Supine to sit: Supervision Sit to supine: Supervision      Transfers Overall transfer level: Needs assistance Equipment used: Rolling walker (2 wheeled) Transfers: Sit to/from Stand Sit to Stand: Min guard            Ambulation/Gait Ambulation/Gait assistance: Mod assist Gait Distance (Feet): 70 Feet Assistive device: Rolling walker (2 wheeled) Gait Pattern/deviations: Step-to pattern;Staggering left;Drifts right/left Gait velocity: reduced Gait velocity interpretation: <1.8 ft/sec, indicate of risk for recurrent falls General Gait Details: pt with increased lateral sway does stagger to left multiple times during session, losses of balance to left more significant with turns to right   Stairs             Wheelchair Mobility    Modified Rankin (Stroke Patients Only)       Balance Overall balance assessment: Needs assistance Sitting-balance support: Single extremity supported;Feet supported Sitting balance-Leahy Scale: Fair Sitting balance - Comments: supervision for static sitting at edge of bed   Standing balance support: Bilateral upper extremity supported Standing balance-Leahy Scale: Poor Standing balance comment: min-modA for dynamic standing balance with BUE support of RW                            Cognition Arousal/Alertness: Awake/alert Behavior During Therapy: Impulsive Overall Cognitive Status: Impaired/Different from baseline Area of Impairment: Orientation;Attention;Memory;Following commands;Safety/judgement;Awareness;Problem solving                 Orientation Level: Disoriented to;Time Current Attention Level: Selective Memory: Decreased recall of precautions;Decreased short-term  memory Following Commands: Follows one step commands  consistently Safety/Judgement: Decreased awareness of safety;Decreased awareness of deficits Awareness: Intellectual Problem Solving: Slow processing        Exercises      General Comments General comments (skin integrity, edema, etc.): pt on 5L 28% FiO2 trach collar, VSS      Pertinent Vitals/Pain Pain Assessment: No/denies pain    Home Living                      Prior Function            PT Goals (current goals can now be found in the care plan section) Acute Rehab PT Goals Patient Stated Goal: to eat PT Goal Formulation: With patient Time For Goal Achievement: 11/22/19 Potential to Achieve Goals: Fair Progress towards PT goals: Progressing toward goals    Frequency    Min 2X/week      PT Plan Current plan remains appropriate    Co-evaluation              AM-PAC PT "6 Clicks" Mobility   Outcome Measure  Help needed turning from your back to your side while in a flat bed without using bedrails?: None Help needed moving from lying on your back to sitting on the side of a flat bed without using bedrails?: None Help needed moving to and from a bed to a chair (including a wheelchair)?: A Little Help needed standing up from a chair using your arms (e.g., wheelchair or bedside chair)?: A Little Help needed to walk in hospital room?: A Lot Help needed climbing 3-5 steps with a railing? : Total 6 Click Score: 17    End of Session Equipment Utilized During Treatment: Oxygen Activity Tolerance: Patient tolerated treatment well Patient left: in bed;with call bell/phone within reach;with bed alarm set Nurse Communication: Mobility status PT Visit Diagnosis: Muscle weakness (generalized) (M62.81);Difficulty in walking, not elsewhere classified (R26.2)     Time: [id number] PT Time Calculation (min) (ACUTE ONLY): 30 min  Charges:  $Gait Training: 8-22 mins $Therapeutic Activity: 8-22 mins                     Zenaida Niece, PT, DPT Acute  Rehabilitation Pager: [phone]    Zenaida Niece 11/08/2019, 4:55 PM

## 2019-11-08 NOTE — Progress Notes (Signed)
Neurosurgery Service Progress Note  Subjective: NAE ON, concern regarding confusion yesterday but on my morning rounds he has been confused/delirious since he was in the unit  Objective: Vitals:   11/08/19 0308 11/08/19 0314 11/08/19 0753 11/08/19 0814  BP:  111/77 105/82   Pulse: 87 86 99 90  Resp: 14 15 16 18   Temp:  98.4 F (36.9 C) 98.2 F (36.8 C)   TempSrc:  Oral Oral   SpO2: 98% 98% 96% 97%  Weight:      Height:       Temp (24hrs), Avg:98.2 F (36.8 C), Min:97.9 F (36.6 C), Max:98.4 F (36.9 C)  CBC Latest Ref Rng & Units 11/06/2019 11/05/2019 11/04/2019  WBC 4.0 - 10.5 K/uL 10.9(H) 12.2(H) 12.5(H)  Hemoglobin 13.0 - 17.0 g/dL 9.5(L) 9.4(L) 9.2(L)  Hematocrit 39.0 - 52.0 % 29.8(L) 29.5(L) 28.9(L)  Platelets 150 - 400 K/uL 265 246 247   BMP Latest Ref Rng & Units 11/06/2019 11/05/2019 11/04/2019  Glucose 70 - 99 mg/dL 116(H) 103(H) 112(H)  BUN 8 - 23 mg/dL 14 16 15   Creatinine 0.61 - 1.24 mg/dL 0.60(L) 0.62 0.58(L)  Sodium 135 - 145 mmol/L 135 135 135  Potassium 3.5 - 5.1 mmol/L 4.3 4.4 4.2  Chloride 98 - 111 mmol/L 97(L) 99 102  CO2 22 - 32 mmol/L 30 28 25   Calcium 8.9 - 10.3 mg/dL 8.4(L) 8.4(L) 8.2(L)    Intake/Output Summary (Last 24 hours) at 11/08/2019 1005 Last data filed at 11/08/2019 0600 Gross per 24 hour  Intake 2857.5 ml  Output 2300 ml  Net 557.5 ml    Current Facility-Administered Medications:  .  0.9 %  sodium chloride infusion, , Intravenous, PRN, Judith Part, MD, Stopped at 10/25/19 1416 .  acetaminophen (TYLENOL) 160 MG/5ML solution 650 mg, 650 mg, Per Tube, Q4H PRN, Ashok Pall, MD, 650 mg at 11/07/19 1541 .  [DISCONTINUED] acetaminophen (TYLENOL) tablet 650 mg, 650 mg, Oral, Q4H PRN **OR** acetaminophen (TYLENOL) suppository 650 mg, 650 mg, Rectal, Q4H PRN, Judith Part, MD .  amLODipine (NORVASC) tablet 10 mg, 10 mg, Per Tube, Daily, Agarwala, Ravi, MD, 10 mg at 11/08/19 0952 .  artificial tears (LACRILUBE) ophthalmic ointment,  , Right Eye, Q4H, Judith Part, MD, Given at 11/08/19 1002 .  bethanechol (URECHOLINE) tablet 10 mg, 10 mg, Per Tube, TID, Kipp Brood, MD, 10 mg at 11/08/19 0952 .  chlorhexidine (PERIDEX) 0.12 % solution 15 mL, 15 mL, Mouth Rinse, BID, Farhiya Rosten A, MD, 15 mL at 11/08/19 1001 .  Chlorhexidine Gluconate Cloth 2 % PADS 6 each, 6 each, Topical, Daily, Judith Part, MD, 6 each at 11/08/19 [id number] .  docusate (COLACE) 50 MG/5ML liquid 100 mg, 100 mg, Per Tube, Daily, Merlene Laughter F, NP, 100 mg at 11/08/19 0952 .  feeding supplement (OSMOLITE 1.2 CAL) liquid 1,000 mL, 1,000 mL, Per Tube, Continuous, Judith Part, MD, Last Rate: 75 mL/hr at 11/07/19 2132, 1,000 mL at 11/07/19 2132 .  feeding supplement (PRO-STAT SUGAR FREE 64) liquid 30 mL, 30 mL, Per Tube, BID, Judith Part, MD, 30 mL at 11/08/19 0952 .  free water 200 mL, 200 mL, Per Tube, Q4H, Sheilyn Boehlke A, MD, 200 mL at 11/08/19 0800 .  heparin injection 5,000 Units, 5,000 Units, Subcutaneous, Q8H, Agarwala, Ravi, MD, 5,000 Units at 11/08/19 0617 .  HYDROcodone-acetaminophen (NORCO/VICODIN) 5-325 MG per tablet 1 tablet, 1 tablet, Per Tube, Q4H PRN, Ashok Pall, MD, 1 tablet at 10/30/19 1214 .  HYDROmorphone (DILAUDID)  injection 0.5 mg, 0.5 mg, Intravenous, Q3H PRN, Judith Part, MD, 0.5 mg at 10/19/19 0931 .  ipratropium-albuterol (DUONEB) 0.5-2.5 (3) MG/3ML nebulizer solution 3 mL, 3 mL, Nebulization, Q6H PRN, Vondra Aldredge A, MD .  labetalol (NORMODYNE) injection 10-40 mg, 10-40 mg, Intravenous, Q10 min PRN, Judith Part, MD, 20 mg at 10/18/19 1404 .  lisinopril (ZESTRIL) tablet 5 mg, 5 mg, Per Tube, Daily, Amadi Frady, Joyice Faster, MD, 5 mg at 11/08/19 0952 .  MEDLINE mouth rinse, 15 mL, Mouth Rinse, q12n4p, Rashiya Lofland, Joyice Faster, MD, 15 mL at 11/07/19 1548 .  multivitamin liquid 15 mL, 15 mL, Per Tube, Daily, Judith Part, MD, 15 mL at 11/08/19 0951 .  ondansetron (ZOFRAN) tablet 4  mg, 4 mg, Oral, Q4H PRN **OR** ondansetron (ZOFRAN) injection 4 mg, 4 mg, Intravenous, Q4H PRN, Yaffa Seckman A, MD .  pantoprazole sodium (PROTONIX) 40 mg/20 mL oral suspension 40 mg, 40 mg, Per Tube, Daily, Judith Part, MD, 40 mg at 11/08/19 0952 .  polyethylene glycol (MIRALAX / GLYCOLAX) packet 17 g, 17 g, Oral, Daily, Judith Part, MD, 17 g at 11/08/19 [id number] .  promethazine (PHENERGAN) tablet 12.5-25 mg, 12.5-25 mg, Per Tube, Q4H PRN, Judith Part, MD .  QUEtiapine (SEROQUEL) tablet 25 mg, 25 mg, Per Tube, QHS, Rigoberto Noel, MD, 25 mg at 11/07/19 2135   Physical Exam: On TC, awake/interactive, FCx4, corneal edema resolved OD, stable R diffuse cranial nerve palsies  Assessment & Plan: 64 y.o. 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 MRI with good debulking of tumor, pre-post op measurements show decrease in tumor volume from 19.7cc-->4.3cc. 4/22 progressive obtundation, CTH with post-op resection cavity hemorrhage not present on MRI, brainstem compression and developing obstructive hydrocephalus, taken back to OR for hematoma removal and hemostasis. Unfortunately, as expected, post-op cranial nerve dysfunction after hematoma evacuation despite normal intra-op stimulation. Post-op CTH with improved mass effect on brainstem, slowly improving ventricular size with some IVH, 4/26 CTH stable ventricular size, hemorrhage resolving, 4/26 intubated for aspiration / PNA, 4/27 trach, 4/30 s/p PEG, 5/2 fever, trach aspirate Cx +S auerus, completed 7d course of cefazolin. 5/13 failed repeat MBS, 5/16 Hb drop, +guaiac, 2U pRBC, resolved w/ PPI  Neuro -R eye keratopathy s/p ABx gtt, resolved. Continue lacrilube gtt to right eye q4h -exam stable with delirium -final path cystic schwannoma  Cardiopulm -no active pulm issues, on TC, intermittent capping / PMV with supervision  FENGI -s/p PEG, on TF & 200q4 FWF -foley replaced for retention -failed repeat  MBS, continue NPO w/ meds & TF via PEG   Heme/ID -s/p completed Tx for MSSA PNA, afebrile, no issues -Hb stable, cont PPI  PPx/Dispo -SNF discharge pending, per SW won't be accepted until 30d post-trach, which is next week -SCDs/TEDs, SQH  Judith Part  11/08/19 10:05 AM

## 2019-11-08 NOTE — Progress Notes (Signed)
Pt's heart monitor reading vent fib/tach while RN in room. RN asked pt if he had chest pain, to which he responded yes. When asked to point to where the pain was, pt indicated left lower region of his chest. He describes it as a dull, achy pain. [place] was paged and responded immediately. Orders written for EKG, troponins, and CMP. Pt has history of complete heart block with pacemaker.

## 2019-11-09 LAB — GLUCOSE, CAPILLARY
Glucose-Capillary: 108 mg/dL — ABNORMAL HIGH (ref 70–99)
Glucose-Capillary: 118 mg/dL — ABNORMAL HIGH (ref 70–99)

## 2019-11-09 NOTE — Progress Notes (Signed)
Subjective: Patient reports Patient on trach collar no complaints  Objective: Vital signs in last 24 hours: Temp:  [97.2 F (36.2 C)-98.8 F (37.1 C)] 97.6 F (36.4 C) (05/22 0800) Pulse Rate:  [83-94] 90 (05/22 0800) Resp:  [10-23] 12 (05/22 0800) BP: (90-111)/(69-77) 111/74 (05/22 0824) SpO2:  [95 %-99 %] 95 % (05/22 0800) FiO2 (%):  [21 %] 21 % (05/22 0824)  Intake/Output from previous day: 05/21 0701 - 05/22 0700 In: 1300 [NG/GT:1300] Out: 1350 [Urine:1350] Intake/Output this shift: No intake/output data recorded.  Moves all extremities well  Lab Results: No results for input(s): WBC, HGB, HCT, PLT in the last 72 hours. BMET Recent Labs    11/08/19 1049  NA 138  K 4.4  CL 101  CO2 29  GLUCOSE 124*  BUN 19  CREATININE 0.67  CALCIUM 8.7*    Studies/Results: No results found.  Assessment/Plan: Recovering on trach collar no significant neuro change rehab placement pending  LOS: 43 days     Faren Florence P 11/09/2019, 8:53 AM

## 2019-11-10 NOTE — Progress Notes (Signed)
Occupational Therapy Treatment Patient Details Name: Casey Richard MRN: [id number] DOB: 09/09/1955 Today's Date: 11/10/2019    History of present illness 64 y/o male admitted secondary to dizziness, weight loss, and gait instability. 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  MRI confirmed 2 large brain masses. S/p right crani for tumor excision on 10/07/19. PMH includes CKD and complete heart block s/p pacemaker. Neuro status changes s/p evacuation of post operative hematoma on 10/10/19. Patient then with acute hypoxic respiratory distress. CXR with bibasilar opacities with right greater then left, concerning for aspiration, re-intubated 4/26, s/p tracheostomy 4/27.  Trach collar on 4/29.  PEG tube 10/18/19.   OT comments  Pt continuing to progress toward stated goals. Focused session on BADL routine. Pt completed bed mobility with supervision level and sit <> stand with min A and RW. Pt completed functional mobility with min A to sink to complete stand grooming. Safety and sequencing cues needed, as well as to compensate for vision. Pt remains with some diplopia and sensitive to light (no occlusion glasses present this session). Pt continues to show deficits in attention, problem solving, awareness, and problem solving. Noted some increased safety awareness when pt asked for steadying assist to stand without UE support. Pt was incontinent this session standing at sink, required max A to assist in peri care. D/c recs remain appropriate, will continue to follow.   Follow Up Recommendations  SNF;Supervision/Assistance - 24 hour    Equipment Recommendations  None recommended by OT    Recommendations for Other Services      Precautions / Restrictions Precautions Precautions: Fall;Other (comment) Precaution Comments: trach, PMV, G-tube, cannot hear out of R ear Required Braces or Orthoses: Other Brace Other Brace: abdominal  binder for multiple tubes and low BP. Restrictions Weight Bearing Restrictions: No       Mobility Bed Mobility Overal bed mobility: Needs Assistance Bed Mobility: Supine to Sit     Supine to sit: Supervision        Transfers Overall transfer level: Needs assistance Equipment used: Rolling walker (2 wheeled) Transfers: Sit to/from Stand Sit to Stand: Min assist         General transfer comment: min A for safety and steadying    Balance Overall balance assessment: Needs assistance Sitting-balance support: Single extremity supported;Feet supported Sitting balance-Leahy Scale: Fair Sitting balance - Comments: supervision for static sitting at edge of bed   Standing balance support: Bilateral upper extremity supported Standing balance-Leahy Scale: Poor Standing balance comment: min-modA for dynamic standing balance with BUE support of RW                           ADL either performed or assessed with clinical judgement   ADL Overall ADL's : Needs assistance/impaired     Grooming: Minimal assistance;Standing;Oral care;Cueing for safety Grooming Details (indicate cue type and reason): pt standing at sink to brush teeth. min A needed for dynamic standing balance. Pt did have awareness to ask OT to steady him when he let go with both hands. Cues needed to rinse out mouth, spit and not swallow water. increased cues also needed due to impaired vision             Lower Body Dressing: Moderate assistance;Sit to/from stand;Cueing for compensatory techniques Lower Body Dressing Details (indicate cue type and reason): sitting EOB required mod A due to decreased vision and impulsivity Toilet  Transfer: Minimal assistance;Ambulation;Regular Toilet;Grab bars;RW [employer] forces technical [employer] Details (indicate cue type and reason): min A to toilet in room after pt incontinent of bowels while standing up at sink Toileting- Clothing Manipulation and Hygiene: Maximal assistance;Sit  to/from stand Toileting - Clothing Manipulation Details (indicate cue type and reason): pt incontinent of stool, then needing max A for thoroughness     Functional mobility during ADLs: Minimal assistance;Rolling walker       Vision Baseline Vision/History: Wears glasses Wears Glasses: Reading only Additional Comments: Pt continues to report diplopia, will asssess for R eye occlusion. Very sensitive to light   Perception     Praxis      Cognition Arousal/Alertness: Awake/alert Behavior During Therapy: Impulsive Overall Cognitive Status: Impaired/Different from baseline Area of Impairment: Orientation;Attention;Memory;Following commands;Safety/judgement;Awareness;Problem solving                 Orientation Level: Disoriented to;Time Current Attention Level: Sustained Memory: Decreased recall of precautions;Decreased short-term memory Following Commands: Follows one step commands consistently Safety/Judgement: Decreased awareness of safety;Decreased awareness of deficits Awareness: Intellectual Problem Solving: Slow processing;Requires verbal cues General Comments: remains impulsive needing several safety cues. Repetitive in some questions, but recalling some information from session throughout. Did demonstrate some increased safety awareness this session in regards to standing balance        Exercises     Shoulder Instructions       General Comments 5L 28% FiO2 trach collar, VSS    Pertinent Vitals/ Pain       Pain Assessment: No/denies pain  Home Living                                          Prior Functioning/Environment              Frequency  Min 2X/week        Progress Toward Goals  OT Goals(current goals can now be found in the care plan section)  Progress towards OT goals: Progressing toward goals  Acute Rehab OT Goals Patient Stated Goal: to eat Time For Goal Achievement: 11/12/19 Potential to Achieve Goals: Good   Plan Discharge plan remains appropriate    Co-evaluation                 AM-PAC OT "6 Clicks" Daily Activity     Outcome Measure   Help from another person eating meals?: Total Help from another person taking care of personal grooming?: A Lot Help from another person toileting, which includes using toliet, bedpan, or urinal?: A Lot Help from another person bathing (including washing, rinsing, drying)?: A Lot Help from another person to put on and taking off regular upper body clothing?: A Lot Help from another person to put on and taking off regular lower body clothing?: A Lot 6 Click Score: 11    End of Session Equipment Utilized During Treatment: Oxygen;Gait belt;Rolling walker  OT Visit Diagnosis: Unsteadiness on feet (R26.81);Cognitive communication deficit (R41.841);Other symptoms and signs involving cognitive function   Activity Tolerance Patient tolerated treatment well   Patient Left in chair;with call bell/phone within reach;with chair alarm set   Nurse Communication Mobility status        Time: 1000-1051 OT Time Calculation (min): 51 min  Charges: OT General Charges $OT Visit: 1 Visit OT Treatments $Self Care/Home Management : 38-52 mins  Zenovia Jarred, MSOT, OTR/L Acute Rehabilitation Services [facility] Office Number: [phone] Pager:  [place] 11/10/2019, 11:14 AM

## 2019-11-10 NOTE — Progress Notes (Signed)
Patient ID: Casey Richard, male   DOB: 08-27-1955, 64 y.o.   MRN: [id number] BP 107/79   Pulse 74   Temp 97.7 F (36.5 C) (Oral)   Resp 14   Ht 5\' 10"  (1.778 m)   Wt 64.6 kg   SpO2 96%   BMI 20.43 kg/m  Alert, oriented x4, right facial nerve palsy,  Swallowing difficulties Wound is clean, dry, no signs of infection Awaiting placement

## 2019-11-11 NOTE — Progress Notes (Signed)
  Speech Language Pathology Treatment: Dysphagia;Passy Muir Speaking valve  Patient Details Name: Casey Richard MRN: [id number] DOB: 02/25/1956 Today's Date: 11/11/2019 Time: [id number] SLP Time Calculation (min) (ACUTE ONLY): 38 min  Assessment / Plan / Recommendation Clinical Impression  Pt's mentation is improved from last session, although still disoriented. He needs Mod cues to attempt swallowing instead of using his yankauer to immediately remove melted ice chips from his mouth. He seems to make more attempts at swallowing today though, as suggested by hyolaryngeal movement noted to palpation. This movement is still minimal, and there is immediate and delayed coughing.   Pt overall is very frustrated today and feeling disheartened, saying that he does not think he has made any progress since surgery. He is voicing concerns about his ability to care for himself post-discharge and specifically that he will not have any source for nutrition. Reinforced that he will have his PEG and the ability to have TFs. SLP provided encouragement and support as well as Mod cues to facilitate communication. PMV was placed for almost 40 minutes without overt signs of intolerance. Pt needs reminders to slow his rate of speech to assist with intelligibility. Will continue to follow and support.    HPI HPI: Pt is a 64 y.o. male admitted with dizziness and gait instability.  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 MRI with good debulking of tumor, pre-post op measurements showed decrease in tumor volume from 19.7cc-->4.3cc. 4/22 presented with progressive obtundation, CTH with post-op resection cavity hemorrhage not present on MRI, brainstem compression and developing obstructive hydrocephalus, taken back to OR 4/22 for hematoma removal and hemostasis. Per surgery  notes, unfortunately, as expected, there was post-op cranial nerve dysfunction after hematoma evacuation despite normal intra-op stimulation. Post-op CTH with improved mass effect on brainstem, slowly improving ventricular size with some IVH. ETT 4/26 Trach 4/27. PEG placed on 4/30. MBS 4/23 with severe dysphagia.       SLP Plan  Continue with current plan of care       Recommendations  Diet recommendations: NPO;Other(comment)(few ice chips after oral care) Medication Administration: Via alternative means      Patient may use Passy-Muir Speech Valve: Intermittently with supervision;During all therapies with supervision PMSV Supervision: Full         Oral Care Recommendations: Oral care QID Follow up Recommendations: Skilled Nursing facility SLP Visit Diagnosis: Dysphagia, oropharyngeal phase (R13.12);Aphonia (R49.1) Plan: Continue with current plan of care       GO                 Osie Bond., M.A. [place] [facility] Pager [phone] Office [phone]  11/11/2019, 11:24 AM

## 2019-11-11 NOTE — Progress Notes (Signed)
Neurosurgery Service Progress Note  Subjective: NAE ON by report, pt was out of the room this morning when I rounded  Objective: Vitals:   11/10/19 2134 11/10/19 2314 11/11/19 0324 11/11/19 0500  BP:  97/73 103/68   Pulse: 79 81 83   Resp: 16 18 15    Temp:  97.9 F (36.6 C) (!) 97.5 F (36.4 C)   TempSrc:  Oral Oral   SpO2: 98% 99% 98%   Weight:    59.1 kg  Height:       Temp (24hrs), Avg:97.7 F (36.5 C), Min:97.5 F (36.4 C), Max:97.9 F (36.6 C)  CBC Latest Ref Rng & Units 11/06/2019 11/05/2019 11/04/2019  WBC 4.0 - 10.5 K/uL 10.9(H) 12.2(H) 12.5(H)  Hemoglobin 13.0 - 17.0 g/dL 9.5(L) 9.4(L) 9.2(L)  Hematocrit 39.0 - 52.0 % 29.8(L) 29.5(L) 28.9(L)  Platelets 150 - 400 K/uL 265 246 247   BMP Latest Ref Rng & Units 11/08/2019 11/06/2019 11/05/2019  Glucose 70 - 99 mg/dL 124(H) 116(H) 103(H)  BUN 8 - 23 mg/dL 19 14 16   Creatinine 0.61 - 1.24 mg/dL 0.67 0.60(L) 0.62  Sodium 135 - 145 mmol/L 138 135 135  Potassium 3.5 - 5.1 mmol/L 4.4 4.3 4.4  Chloride 98 - 111 mmol/L 101 97(L) 99  CO2 22 - 32 mmol/L 29 30 28   Calcium 8.9 - 10.3 mg/dL 8.7(L) 8.4(L) 8.4(L)    Intake/Output Summary (Last 24 hours) at 11/11/2019 0731 Last data filed at 11/11/2019 [id number] Gross per 24 hour  Intake 2795 ml  Output 2825 ml  Net -30 ml    Current Facility-Administered Medications:    0.9 %  sodium chloride infusion, , Intravenous, PRN, Judith Part, MD, Stopped at 10/25/19 1416   acetaminophen (TYLENOL) 160 MG/5ML solution 650 mg, 650 mg, Per Tube, Q4H PRN, Ashok Pall, MD, 650 mg at 11/07/19 1541   [DISCONTINUED] acetaminophen (TYLENOL) tablet 650 mg, 650 mg, Oral, Q4H PRN **OR** acetaminophen (TYLENOL) suppository 650 mg, 650 mg, Rectal, Q4H PRN, Judith Part, MD   amLODipine (NORVASC) tablet 10 mg, 10 mg, Per Tube, Daily, Agarwala, Ravi, MD, 10 mg at 11/10/19 0846   artificial tears (LACRILUBE) ophthalmic ointment, , Right Eye, Q4H, Judith Part, MD, Given at 11/11/19  0617   bethanechol (URECHOLINE) tablet 10 mg, 10 mg, Per Tube, TID, Agarwala, Ravi, MD, 10 mg at 11/10/19 2311   chlorhexidine (PERIDEX) 0.12 % solution 15 mL, 15 mL, Mouth Rinse, BID, Kashae Carstens A, MD, 15 mL at 11/10/19 2312   Chlorhexidine Gluconate Cloth 2 % PADS 6 each, 6 each, Topical, Daily, Naomi Fitton, Joyice Faster, MD, 6 each at 11/10/19 0846   docusate (COLACE) 50 MG/5ML liquid 100 mg, 100 mg, Per Tube, Daily, Merlene Laughter F, NP, 100 mg at 11/10/19 0845   feeding supplement (OSMOLITE 1.2 CAL) liquid 1,000 mL, 1,000 mL, Per Tube, Continuous, Mauriah Mcmillen, Joyice Faster, MD, Last Rate: 75 mL/hr at 11/11/19 0429, 1,000 mL at 11/11/19 0429   feeding supplement (PRO-STAT SUGAR FREE 64) liquid 30 mL, 30 mL, Per Tube, BID, Jaquez Farrington A, MD, 30 mL at 11/10/19 2311   free water 200 mL, 200 mL, Per Tube, Q4H, Hendy Brindle A, MD, 200 mL at 11/11/19 0326   heparin injection 5,000 Units, 5,000 Units, Subcutaneous, Q8H, Agarwala, Ravi, MD, 5,000 Units at 11/11/19 0617   HYDROcodone-acetaminophen (NORCO/VICODIN) 5-325 MG per tablet 1 tablet, 1 tablet, Per Tube, Q4H PRN, Ashok Pall, MD, 1 tablet at 10/30/19 1214   HYDROmorphone (DILAUDID) injection 0.5 mg, 0.5  mg, Intravenous, Q3H PRN, Judith Part, MD, 0.5 mg at 10/19/19 0931   ipratropium-albuterol (DUONEB) 0.5-2.5 (3) MG/3ML nebulizer solution 3 mL, 3 mL, Nebulization, Q6H PRN, Leary Mcnulty, Joyice Faster, MD   labetalol (NORMODYNE) injection 10-40 mg, 10-40 mg, Intravenous, Q10 min PRN, Judith Part, MD, 20 mg at 10/18/19 1404   lisinopril (ZESTRIL) tablet 5 mg, 5 mg, Per Tube, Daily, Jospeh Mangel, Joyice Faster, MD, 5 mg at 11/10/19 0846   MEDLINE mouth rinse, 15 mL, Mouth Rinse, q12n4p, Rourke Mcquitty, Joyice Faster, MD, 15 mL at 11/10/19 1545   multivitamin liquid 15 mL, 15 mL, Per Tube, Daily, Fatiha Guzy, Joyice Faster, MD, 15 mL at 11/10/19 0855   ondansetron (ZOFRAN) tablet 4 mg, 4 mg, Oral, Q4H PRN **OR** ondansetron (ZOFRAN) injection 4 mg, 4 mg,  Intravenous, Q4H PRN, Judith Part, MD   pantoprazole sodium (PROTONIX) 40 mg/20 mL oral suspension 40 mg, 40 mg, Per Tube, Daily, Seyon Strader, Joyice Faster, MD, 40 mg at 11/10/19 0846   polyethylene glycol (MIRALAX / GLYCOLAX) packet 17 g, 17 g, Oral, Daily, Ovetta Bazzano A, MD, 17 g at 11/09/19 0933   promethazine (PHENERGAN) tablet 12.5-25 mg, 12.5-25 mg, Per Tube, Q4H PRN, Judith Part, MD   QUEtiapine (SEROQUEL) tablet 25 mg, 25 mg, Per Tube, QHS, Rigoberto Noel, MD, 25 mg at 11/10/19 2311   Physical Exam: Pt out of room this morning working w/ PT  Assessment & Plan: 64 y.o. 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 MRI with good debulking of tumor, pre-post op measurements show decrease in tumor volume from 19.7cc-->4.3cc. 4/22 progressive obtundation, CTH with post-op resection cavity hemorrhage not present on MRI, brainstem compression and developing obstructive hydrocephalus, taken back to OR for hematoma removal and hemostasis. Unfortunately, as expected, post-op cranial nerve dysfunction after hematoma evacuation despite normal intra-op stimulation. Post-op CTH with improved mass effect on brainstem, slowly improving ventricular size with some IVH, 4/26 CTH stable ventricular size, hemorrhage resolving, 4/26 intubated for aspiration / PNA, 4/27 trach, 4/30 s/p PEG, 5/2 fever, trach aspirate Cx +S auerus, completed 7d course of cefazolin. 5/13 failed repeat MBS, 5/16 Hb drop, +guaiac, 2U pRBC, resolved w/ PPI  Neuro -R eye keratopathy s/p ABx gtt, resolved. Continue lacrilube gtt to right eye q4h -exam stable with delirium -final path cystic schwannoma  Cardiopulm -no active pulm issues, on TC, intermittent capping / PMV with supervision  FENGI -s/p PEG, on TF & 200q4 FWF -foley replaced for retention -failed repeat MBS, continue NPO w/ meds & TF via PEG   Heme/ID -s/p completed Tx for MSSA PNA, afebrile, no issues -Hb stable, cont  PPI  PPx/Dispo -SNF discharge pending this week, awaiting guidance from SW on timing -SCDs/TEDs, SQH  Judith Part  11/11/19 7:31 AM

## 2019-11-11 NOTE — Progress Notes (Signed)
NAME:  Casey Richard, MRN:  [id number], DOB:  May 08, 1956, LOS: 58 ADMISSION DATE:  09/27/2019, CONSULTATION DATE:  10/13/2019 REFERRING MD:  Dr. Zada Finders, CHIEF COMPLAINT:  Acute Hypoxic Respiratory Distress   History of present illness   64 year old male presents to ED on 4/9 with gait instability, memory loss, and falls. CT Head with hypodense lesion with mass effect on pons, right cerebellum. Treated with steroids. 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 On 4/22 patient with lethargy. Taken for CT head which revealed post-operative hemorrhage with brainstem compression. Taken to OR for evacuation. 4/24 patient with acute hypoxic respiratory distress. CXR with bibasilar opacities with right greater then left, concerning for aspiration. PCCM consulted.  Course complicated by prolonged mechanical ventilation requiring tracheostomy 4/27  Past Medical History   Past Medical History:  Diagnosis Date  . Cardiac arrest ([place])   . CHB (complete heart block) ([place])   . 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 Dysrhythmia 1/13   bradycardia, VF, cardiac arrest/complete heart block with pacer inserted/LOV Dr Sallyanne Kuster 07/21/11 with interrogation and anesthesia guideline order on chart. Chest x ray 1/13 EPIC,, TEE and [facility]  1/13 EPIC  . Memory loss, short term    from fall with heart block 1/13  . Nonsustained ventricular tachycardia ([place]) 02/07/2014  . Presence of permanent cardiac pacemaker 07/15/2011   [employer] Revo  . Seasonal allergies   . Shortness of breath      Significant Hospital Events   4/9 > Admit 4/19 > Resection  4/22 > Evacuation of hemorrhage  4/25 > PCCM Consulted  4/27 > Trached   Significant Diagnostic Tests:  CT Head 4/09 >  1. No acute intracranial or calvarial findings. 2. New large cystic mass at the right cerebellar pontine angle with mass effect on the pons and cerebellum, but no  resulting hydrocephalus. 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 Cystic neoplasm less likely. Recommend further evaluation with MRI of the brain without and with contrast (if possible; the patient has a pacemaker) and non emergent neuro surgical consultation. 3. No evidence of acute cervical spine fracture, traumatic subluxation or static signs of instability. 4. Mildly progressive cervical spondylosis   MR Brain 4/12 >  1. 4.7 x 3.4 x 4.1 cm multi-cystic right cerebellopontine angle mass with associated abnormal enhancement extending into the right internal auditory canal. 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 Significant associated mass effect upon the right cerebellopontine and cerebellomedullary angle with partial effacement of the fourth ventricle and mild adjacent parenchymal edema. No hydrocephalus. 2. A 17 x 14 mm hypoenhancing mass within the left aspect of the sella has increased in size as compared MRI 02/15/2012. 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 Correlate with relevant laboratory values. 3. Mild chronic small vessel ischemic, progressed as compared to 02/15/2012. 4. Mild ethmoid sinus mucosal thickening. Small bilateral maxillary sinus mucous retention cysts.  CT Head 4/22 >  Right occipital craniectomy and cranioplasty related 2 debulking surgery for cystic mass of the right CP angle region. Gross measurements of the lesion today are 3.2 x 5.3 x 4.5 cm. Internal hemorrhage within the residual cystic lesion. Mass-effect upon the fourth ventricle with subsequent enlargement of the lateral and third ventricles consistent with mild hydrocephalus. Small amount of blood layering dependently in the  occipital horns. Small amount of subdural blood along the inferior tentorium as well as dissecting  inferiorly to and through the foramen magnum to the C1 level with mild mass effect upon the brainstem.   CXR 4//24 > 1. Bibasilar opacities, right greater than left may represent aspiration or pneumonia. There may be associated effusion on the right. Recommend clinical correlation and short-term follow-up imaging to ensure resolution. 2. No other acute abnormalities.  Head CT 4/26 >: 1. Interval improvement in hemorrhage in the tumor bed right cerebellar pontine angle . 2. Mild ventricular hemorrhage and mild hydrocephalus unchanged. Subarachnoid hemorrhage bilaterally appears unchanged on the right but improved on the left. No new hemorrhage.  Micro Data:  Sputum 4/25 > negative  Blood 4/25 > negative  Urine culture 4/9 > Staph Epidermis > sensitive to cipro or bactrium,  5/1 resp: mod MSSA 5/1 blood: ngtd 5/1 urine: neg  Antimicrobials:  Zosyn 4/25 >> 4/30 vanc 5/2  Interim history/subjective:  Continues to tolerate trach collar. Anticipate continued recovery of CN function  Objective   Blood pressure 97/70, pulse 85, temperature (!) 97.4 F (36.3 C), resp. rate 14, height 5\' 10"  (1.778 m), weight 59.1 kg, SpO2 100 %.    FiO2 (%):  [21 %] 21 %   Intake/Output Summary (Last 24 hours) at 11/11/2019 1809 Last data filed at 11/11/2019 1133 Gross per 24 hour  Intake 995 ml  Output 2900 ml  Net -1905 ml   Filed Weights   11/07/19 0500 11/10/19 0555 11/11/19 0500  Weight: 60.6 kg 64.6 kg 59.1 kg    Examination: General chronically ill appearing white male no acute distress HENT Number 4 cuffless trach in place. Phonation strong. 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 He did tolerate capping trial briefly but cannot clear secretions effectively and requires sxn. I spoke w/ neuro-surg. The hope is that his ability to control upper airway may improve w/ time but could be months. Because of this he is not a candidate for decannulation for the near future. We will need to work on this in collaboration w/ Neuro surg in the out pt setting   Plan Cont routine trach care Asp and reflux precautions PMV supervised.   We will cont to see weekly  Kipp Brood, MD [facility] ICU Physician [place]  Pager: [phone] Mobile: [phone] After hours: [phone].  11/11/2019, 6:10 PM

## 2019-11-11 NOTE — Progress Notes (Signed)
Physical Therapy Treatment Patient Details Name: Casey Richard MRN: [id number] DOB: 1956/04/17 Today's Date: 11/11/2019    History of Present Illness 64 y/o male admitted secondary to dizziness, weight loss, and gait instability. 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  MRI confirmed 2 large brain masses. S/p right crani for tumor excision on 10/07/19. PMH includes CKD and complete heart block s/p pacemaker. Neuro status changes s/p evacuation of post operative hematoma on 10/10/19. Patient then with acute hypoxic respiratory distress. CXR with bibasilar opacities with right greater then left, concerning for aspiration, re-intubated 4/26, s/p tracheostomy 4/27.  Trach collar on 4/29.  PEG tube 10/18/19.    PT Comments    Pt remains to have diplopia, patch helps but pt frequently stopping to assess patch. Pt remains ataxic requiring max tactile cues at hips for stability during ambulation. Pt with flat affect and depressed spirits with frustration of lack of improvement functionally and in eye sight, however pt with improved ambulation tolerance and endurance today. Acute PT to cont to recommend SNF upon d/c to maximize functional recovery.    Follow Up Recommendations  SNF     Equipment Recommendations  Rolling walker with 5" wheels;3in1 (PT);Wheelchair (measurements PT);Wheelchair cushion (measurements PT)    Recommendations for Other Services       Precautions / Restrictions Precautions Precautions: Fall;Other (comment) Precaution Comments: trach, PMV, G-tube, cannot hear out of R ear Required Braces or Orthoses: Other Brace Other Brace: abdominal binder for multiple tubes and low BP. Restrictions Weight Bearing Restrictions: No    Mobility  Bed Mobility Overal bed mobility: Needs Assistance Bed Mobility: Rolling;Sidelying to Sit Rolling: Supervision Sidelying to sit: Min assist       General bed  mobility comments: max directional verbal cues, minA for trunk elevation  Transfers Overall transfer level: Needs assistance Equipment used: Rolling walker (2 wheeled) Transfers: Sit to/from Stand Sit to Stand: Min assist         General transfer comment: min A for safety and steadying  Ambulation/Gait Ambulation/Gait assistance: Mod assist;+2 safety/equipment Gait Distance (Feet): 120 Feet Assistive device: Rolling walker (2 wheeled) Gait Pattern/deviations: Step-to pattern;Staggering left;Drifts right/left Gait velocity: reduced Gait velocity interpretation: <1.8 ft/sec, indicate of risk for recurrent falls General Gait Details: pt with improved gait pattern and stability when PT provided bilat hip tactile cues and stabilization, max directional verbal cues to navigate around hallways and minA for walker management   Stairs             Wheelchair Mobility    Modified Rankin (Stroke Patients Only)       Balance Overall balance assessment: Needs assistance Sitting-balance support: Single extremity supported;Feet supported Sitting balance-Leahy Scale: Fair Sitting balance - Comments: supervision for static sitting at edge of bed   Standing balance support: Bilateral upper extremity supported Standing balance-Leahy Scale: Poor Standing balance comment: min-modA for dynamic standing balance with BUE support of RW                            Cognition Arousal/Alertness: Awake/alert Behavior During Therapy: Flat affect Overall Cognitive Status: Impaired/Different from baseline Area of Impairment: Safety/judgement;Following commands;Problem solving                   Current Attention Level: Sustained   Following Commands: Follows multi-step commands inconsistently;Follows multi-step commands with increased time Safety/Judgement: Decreased awareness of safety;Decreased awareness of  deficits Awareness: Intellectual(pt having BM in hallway and pt  with no awareness) Problem Solving: Slow processing;Decreased initiation;Difficulty sequencing;Requires verbal cues;Requires tactile cues General Comments: pt continues to require freq max directional verbal and tactile cues to complete tasks      Exercises      General Comments General comments (skin integrity, edema, etc.): >94% on 28% FiO2, pt started having a BM during ambulation, pt assisted to bathroom and left with RN and RN tech      Pertinent Vitals/Pain Pain Assessment: Faces Faces Pain Scale: Hurts little more Pain Location: bottom Pain Descriptors / Indicators: Discomfort;Sore Pain Intervention(s): Monitored during session    Home Living                      Prior Function            PT Goals (current goals can now be found in the care plan section) Progress towards PT goals: Progressing toward goals    Frequency    Min 2X/week      PT Plan Current plan remains appropriate    Co-evaluation              AM-PAC PT "6 Clicks" Mobility   Outcome Measure  Help needed turning from your back to your side while in a flat bed without using bedrails?: A Little Help needed moving from lying on your back to sitting on the side of a flat bed without using bedrails?: A Little Help needed moving to and from a bed to a chair (including a wheelchair)?: A Little Help needed standing up from a chair using your arms (e.g., wheelchair or bedside chair)?: A Little Help needed to walk in hospital room?: A Lot Help needed climbing 3-5 steps with a railing? : Total 6 Click Score: 15    End of Session Equipment Utilized During Treatment: Oxygen Activity Tolerance: Patient tolerated treatment well Patient left: (on commode with RN and RN tech) Nurse Communication: Mobility status PT Visit Diagnosis: Muscle weakness (generalized) (M62.81);Difficulty in walking, not elsewhere classified (R26.2)     Time: [id number] PT Time Calculation (min) (ACUTE ONLY): 25  min  Charges:  $Gait Training: 23-37 mins                     Kittie Plater, PT, DPT Acute Rehabilitation Services Pager #: [phone] Office #: [phone]    Berline Lopes 11/11/2019, 1:43 PM

## 2019-11-12 NOTE — Progress Notes (Signed)
Neurosurgery Service Progress Note  Subjective: NAE ON, no new complaints  Objective: Vitals:   11/12/19 0500 11/12/19 0747 11/12/19 0811 11/12/19 0946  BP:  111/81  108/79  Pulse:  81 76   Resp:  15 19   Temp:  (!) 97.5 F (36.4 C)    TempSrc:  Axillary    SpO2:  99% 100%   Weight: 62.5 kg     Height:       Temp (24hrs), Avg:97.7 F (36.5 C), Min:97.4 F (36.3 C), Max:98.1 F (36.7 C)  CBC Latest Ref Rng & Units 11/06/2019 11/05/2019 11/04/2019  WBC 4.0 - 10.5 K/uL 10.9(H) 12.2(H) 12.5(H)  Hemoglobin 13.0 - 17.0 g/dL 9.5(L) 9.4(L) 9.2(L)  Hematocrit 39.0 - 52.0 % 29.8(L) 29.5(L) 28.9(L)  Platelets 150 - 400 K/uL 265 246 247   BMP Latest Ref Rng & Units 11/08/2019 11/06/2019 11/05/2019  Glucose 70 - 99 mg/dL 124(H) 116(H) 103(H)  BUN 8 - 23 mg/dL 19 14 16   Creatinine 0.61 - 1.24 mg/dL 0.67 0.60(L) 0.62  Sodium 135 - 145 mmol/L 138 135 135  Potassium 3.5 - 5.1 mmol/L 4.4 4.3 4.4  Chloride 98 - 111 mmol/L 101 97(L) 99  CO2 22 - 32 mmol/L 29 30 28   Calcium 8.9 - 10.3 mg/dL 8.7(L) 8.4(L) 8.4(L)    Intake/Output Summary (Last 24 hours) at 11/12/2019 1042 Last data filed at 11/12/2019 0700 Gross per 24 hour  Intake 1707.5 ml  Output 650 ml  Net 1057.5 ml    Current Facility-Administered Medications:  .  0.9 %  sodium chloride infusion, , Intravenous, PRN, Judith Part, MD, Stopped at 10/25/19 1416 .  acetaminophen (TYLENOL) 160 MG/5ML solution 650 mg, 650 mg, Per Tube, Q4H PRN, Ashok Pall, MD, 650 mg at 11/07/19 1541 .  [DISCONTINUED] acetaminophen (TYLENOL) tablet 650 mg, 650 mg, Oral, Q4H PRN **OR** acetaminophen (TYLENOL) suppository 650 mg, 650 mg, Rectal, Q4H PRN, Judith Part, MD .  amLODipine (NORVASC) tablet 10 mg, 10 mg, Per Tube, Daily, Agarwala, Ravi, MD, 10 mg at 11/10/19 0846 .  artificial tears (LACRILUBE) ophthalmic ointment, , Right Eye, Q4H, Judith Part, MD, Given at 11/12/19 [phone]  bethanechol (URECHOLINE) tablet 10 mg, 10 mg, Per  Tube, TID, Kipp Brood, MD, 10 mg at 11/12/19 0946 .  chlorhexidine (PERIDEX) 0.12 % solution 15 mL, 15 mL, Mouth Rinse, BID, [place] Wanner, Joyice Faster, MD, 15 mL at 11/12/19 0946 .  Chlorhexidine Gluconate Cloth 2 % PADS 6 each, 6 each, Topical, Daily, Judith Part, MD, 6 each at 11/12/19 1032 .  docusate (COLACE) 50 MG/5ML liquid 100 mg, 100 mg, Per Tube, Daily, Merlene Laughter F, NP, 100 mg at 11/10/19 0845 .  feeding supplement (OSMOLITE 1.2 CAL) liquid 1,000 mL, 1,000 mL, Per Tube, Continuous, Marnell Mcdaniel, Joyice Faster, MD, Last Rate: 75 mL/hr at 11/11/19 2025, 1,000 mL at 11/11/19 2025 .  feeding supplement (PRO-STAT SUGAR FREE 64) liquid 30 mL, 30 mL, Per Tube, BID, Jyaire Koudelka A, MD, 30 mL at 11/12/19 0946 .  free water 200 mL, 200 mL, Per Tube, Q4H, Judith Part, MD, 200 mL at 11/12/19 0806 .  heparin injection 5,000 Units, 5,000 Units, Subcutaneous, Q8H, Kipp Brood, MD, 5,000 Units at 11/12/19 0553 .  HYDROcodone-acetaminophen (NORCO/VICODIN) 5-325 MG per tablet 1 tablet, 1 tablet, Per Tube, Q4H PRN, Ashok Pall, MD, 1 tablet at 11/12/19 0946 .  HYDROmorphone (DILAUDID) injection 0.5 mg, 0.5 mg, Intravenous, Q3H PRN, Judith Part, MD, 0.5 mg at 10/19/19 0931 .  ipratropium-albuterol (DUONEB) 0.5-2.5 (3) MG/3ML nebulizer solution 3 mL, 3 mL, Nebulization, Q6H PRN, Harrison Zetina A, MD .  labetalol (NORMODYNE) injection 10-40 mg, 10-40 mg, Intravenous, Q10 min PRN, Judith Part, MD, 20 mg at 10/18/19 1404 .  lisinopril (ZESTRIL) tablet 5 mg, 5 mg, Per Tube, Daily, Ezriel Boffa, Joyice Faster, MD, 5 mg at 11/12/19 0947 .  MEDLINE mouth rinse, 15 mL, Mouth Rinse, q12n4p, Nylia Gavina, Joyice Faster, MD, 15 mL at 11/11/19 1806 .  multivitamin liquid 15 mL, 15 mL, Per Tube, Daily, Judith Part, MD, 15 mL at 11/12/19 0946 .  ondansetron (ZOFRAN) tablet 4 mg, 4 mg, Oral, Q4H PRN **OR** ondansetron (ZOFRAN) injection 4 mg, 4 mg, Intravenous, Q4H PRN, Jenifer Struve A, MD .   pantoprazole sodium (PROTONIX) 40 mg/20 mL oral suspension 40 mg, 40 mg, Per Tube, Daily, Judith Part, MD, 40 mg at 11/12/19 0946 .  polyethylene glycol (MIRALAX / GLYCOLAX) packet 17 g, 17 g, Oral, Daily, Judith Part, MD, 17 g at 11/09/19 0933 .  promethazine (PHENERGAN) tablet 12.5-25 mg, 12.5-25 mg, Per Tube, Q4H PRN, Judith Part, MD .  QUEtiapine (SEROQUEL) tablet 25 mg, 25 mg, Per Tube, QHS, Rigoberto Noel, MD, 25 mg at 11/10/19 2311   Physical Exam: Pt out of room this morning working w/ PT  Assessment & Plan: 64 y.o. 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 MRI with good debulking of tumor, pre-post op measurements show decrease in tumor volume from 19.7cc-->4.3cc. 4/22 progressive obtundation, CTH with post-op resection cavity hemorrhage not present on MRI, brainstem compression and developing obstructive hydrocephalus, taken back to OR for hematoma removal and hemostasis. Unfortunately, as expected, post-op cranial nerve dysfunction after hematoma evacuation despite normal intra-op stimulation. Post-op CTH with improved mass effect on brainstem, slowly improving ventricular size with some IVH, 4/26 CTH stable ventricular size, hemorrhage resolving, 4/26 intubated for aspiration / PNA, 4/27 trach, 4/30 s/p PEG, 5/2 fever, trach aspirate Cx +S auerus, completed 7d course of cefazolin. 5/13 failed repeat MBS, 5/16 Hb drop, +guaiac, 2U pRBC, resolved w/ PPI  Neuro -R eye keratopathy s/p ABx gtt, resolved. Continue lacrilube gtt to right eye q4h -exam stable with delirium -final path cystic schwannoma  Cardiopulm -no active pulm issues, on TC, intermittent capping / PMV with supervision  FENGI -s/p PEG, on TF & 200q4 FWF -foley replaced for retention -failed repeat MBS, continue NPO w/ meds & TF via PEG   Heme/ID -s/p completed Tx for MSSA PNA, afebrile, no issues -Hb stable, cont PPI  PPx/Dispo -SNF discharge pending on 5/27, ready for  discharge when accepted by SNF -SCDs/TEDs, [facility]  Marcello Moores A Termaine Roupp  11/12/19 10:42 AM

## 2019-11-12 NOTE — TOC Initial Note (Signed)
Transition of Care [facility]) - Initial/Assessment Note    Patient Details  Name: Casey Richard MRN: [id number] Date of Birth: [birth year]  Transition of Care [facility]) CM/SW Contact:    Vinie Sill, [place] Phone Number: 11/12/2019, 12:59 PM  Clinical Narrative:                  CSW visit with patient at bedside. CSW confirmed with patient plan to discharge to [facility]. CSW advised insurance authorization will be started today. Patient remains agreeable to SNF placement.   RN updated - per SNF they need updated RT note what includes current  Fio2 % and how often suction is needed   SNF has confirmed bed offer. SNF will assist and start insurance authorization today.   Thurmond Butts, MSW, [place] Clinical Social Worker   Expected Discharge Plan: Skilled Nursing Facility Barriers to Discharge: Ship broker, Continued Medical Work up(SNF require 30 day or more before admit with trach)   Patient Goals and CMS Choice        Expected Discharge Plan and Services Expected Discharge Plan: [place] In-house Referral: Clinical Social Work     Living arrangements for the past 2 months: Single Family Home                                      Prior Living Arrangements/Services Living arrangements for the past 2 months: Single Family Home Lives with:: Self Patient language and need for interpreter reviewed:: No        Need for Family Participation in Patient Care: Yes (Comment) Care giver support system in place?: Yes (comment)   Criminal Activity/Legal Involvement Pertinent to Current Situation/Hospitalization: No - Comment as needed  Activities of Daily Living Home Assistive Devices/Equipment: Eyeglasses(walking stick) ADL Screening (condition at time of admission) Patient's cognitive ability adequate to safely complete daily activities?: Yes Is the patient deaf or have difficulty hearing?: Yes Does the patient have difficulty seeing,  even when wearing glasses/contacts?: Yes Does the patient have difficulty concentrating, remembering, or making decisions?: Yes Patient able to express need for assistance with ADLs?: Yes Does the patient have difficulty dressing or bathing?: Yes Independently performs ADLs?: Yes (appropriate for developmental age) Does the patient have difficulty walking or climbing stairs?: Yes Weakness of Legs: Both Weakness of Arms/Hands: Both  Permission Sought/Granted Permission sought to share information with : Family Supports Permission granted to share information with : Yes, Verbal Permission Granted  Share Information with NAME: Prem Jacobucci  Permission granted to share info w AGENCY: SNFs  Permission granted to share info w Relationship: brother  Permission granted to share info w Contact Information: [phone]  Emotional Assessment       Orientation: : Oriented to Self, Oriented to Place, Oriented to  Time, Oriented to Situation Alcohol / Substance Use: Not Applicable Psych Involvement: No (comment)  Admission diagnosis:  Brain mass [G93.89] Brain tumor [facility] [D49.6] Patient Active Problem List   Diagnosis Date Noted  . Hypoxia   . Tracheostomy status ([place])   . Ventilator dependent ([place])   . Protein-calorie malnutrition, severe 10/10/2019  . Steroid-induced hyperglycemia   . Leukocytosis   . Acute blood loss anemia   . Brain tumor ([place]) 10/07/2019  . Brain mass 09/27/2019  . Gait instability 09/27/2019  . Paroxysmal atrial tachycardia ([place]) 10/27/2015  . Nonsustained ventricular tachycardia ([place]) 02/07/2014  . CHB (complete heart block) ([place])   .  Memory loss, short term   . 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 NSTEMI (non-ST elevated myocardial infarction) ([place]) 07/10/2011  . Ventricular fibrillation ([place]) 07/09/2011  . 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 Complete heart block, requiring external pacing then temp wire pacing. 07/09/2011    PCP:  Benito Mccreedy, MD Pharmacy:   [facility] [place], [address] [place] [address] Phone: [phone] Fax: [phone]     Social Determinants of Health (SDOH) Interventions    Readmission Risk Interventions No flowsheet data found.

## 2019-11-12 NOTE — Progress Notes (Signed)
RT note- Patient is on room air with aerosol humidification. Normal vital signs with sp02 100%. BBS are equal with occasional strong productive cough, pale yellow secretions at times, has very little need for suctioning, once a shift. Tracheostomy is #4.0 shiley uncuffed.

## 2019-11-13 LAB — SARS CORONAVIRUS 2 (TAT 6-24 HRS): SARS Coronavirus 2: NEGATIVE

## 2019-11-13 NOTE — Progress Notes (Signed)
Nutrition Follow-up  DOCUMENTATION CODES:   Severe malnutrition in context of chronic illness  INTERVENTION:  Continue Osmolite 1.2 formula via PEG at goal rate of 75 ml/hr.  Continue 30 ml Prostat BID per tube.   Free water flushes of 200 ml every 4 hours per tube (MD to adjust as appropriate).  Tube feeding regimen to provide 2360 kcal (100% of needs), 130 grams of protein, and 2676 ml free water.   NUTRITION DIAGNOSIS:   Severe Malnutrition related to chronic illness as evidenced by severe muscle depletion, severe fat depletion, percent weight loss, energy intake < or equal to 75% for > or equal to 1 month; ongoing  GOAL:   Patient will meet greater than or equal to 90% of their needs; met with TF  MONITOR:   PO intake, Supplement acceptance, Weight trends, Labs  REASON FOR ASSESSMENT:   Consult Enteral/tube feeding initiation and management  ASSESSMENT:   Pt with a PMH significant for complete heart block s/p PPM who presented to the hospital for worsening dizziness, gait instability, and weight loss. MRI revealed 2 large brain masses.  4/19 s/p RIGHT CRANIOTOMY TUMOR EXCISION 4/22 s/p repeat crani for evacuation of hematoma after tumor excision  4/23 failed swallow, Cortrak placed; tip gastric  4/26 intubated 4/27 trach 4/30 PEG placed   Pt continues on trach collar and NPO status. Pt has been tolerating his tube feeds well via PEG. RD to continue with current tube feeding orders. Labs and medications reviewed.   Diet Order:   Diet Order            Diet NPO time specified Except for: Ice Chips  Diet effective midnight              EDUCATION NEEDS:   Not appropriate for education at this time  Skin:  Skin Assessment: Skin Integrity Issues: Skin Integrity Issues:: Incisions Incisions: head  Last BM:  5/25  Height:   Ht Readings from Last 1 Encounters:  10/14/19 _0  (1.778 m)    Weight:   Wt Readings from Last 1 Encounters:  11/12/19  62.5 kg    BMI:  Body mass index is 19.77 kg/m.  Estimated Nutritional Needs:   Kcal:  2100-2400  Protein:  110-130 grams  Fluid:  >2L/d  Corrin Parker, MS, RD, LDN RD pager number/after hours weekend pager number on Amion.

## 2019-11-13 NOTE — Progress Notes (Signed)
Neurosurgery Service Progress Note  Subjective: NAE ON, no new complaints this morning  Objective: Vitals:   11/13/19 0306 11/13/19 0413 11/13/19 0751 11/13/19 0810  BP:  104/68 108/71 106/72  Pulse: 85 82 78 80  Resp: 15 12 14 15   Temp:  98.3 F (36.8 C) 98 F (36.7 C)   TempSrc:  Oral Oral   SpO2: 99% 97% 98% 98%  Weight:      Height:       Temp (24hrs), Avg:98.2 F (36.8 C), Min:97.7 F (36.5 C), Max:98.8 F (37.1 C)  CBC Latest Ref Rng & Units 11/06/2019 11/05/2019 11/04/2019  WBC 4.0 - 10.5 K/uL 10.9(H) 12.2(H) 12.5(H)  Hemoglobin 13.0 - 17.0 g/dL 9.5(L) 9.4(L) 9.2(L)  Hematocrit 39.0 - 52.0 % 29.8(L) 29.5(L) 28.9(L)  Platelets 150 - 400 K/uL 265 246 247   BMP Latest Ref Rng & Units 11/08/2019 11/06/2019 11/05/2019  Glucose 70 - 99 mg/dL 124(H) 116(H) 103(H)  BUN 8 - 23 mg/dL 19 14 16   Creatinine 0.61 - 1.24 mg/dL 0.67 0.60(L) 0.62  Sodium 135 - 145 mmol/L 138 135 135  Potassium 3.5 - 5.1 mmol/L 4.4 4.3 4.4  Chloride 98 - 111 mmol/L 101 97(L) 99  CO2 22 - 32 mmol/L 29 30 28   Calcium 8.9 - 10.3 mg/dL 8.7(L) 8.4(L) 8.4(L)    Intake/Output Summary (Last 24 hours) at 11/13/2019 0849 Last data filed at 11/13/2019 [id number] Gross per 24 hour  Intake --  Output 2575 ml  Net -2575 ml    Current Facility-Administered Medications:  .  0.9 %  sodium chloride infusion, , Intravenous, PRN, Judith Part, MD, Stopped at 10/25/19 1416 .  acetaminophen (TYLENOL) 160 MG/5ML solution 650 mg, 650 mg, Per Tube, Q4H PRN, Ashok Pall, MD, 650 mg at 11/07/19 1541 .  [DISCONTINUED] acetaminophen (TYLENOL) tablet 650 mg, 650 mg, Oral, Q4H PRN **OR** acetaminophen (TYLENOL) suppository 650 mg, 650 mg, Rectal, Q4H PRN, Judith Part, MD .  amLODipine (NORVASC) tablet 10 mg, 10 mg, Per Tube, Daily, Agarwala, Ravi, MD, 10 mg at 11/10/19 0846 .  artificial tears (LACRILUBE) ophthalmic ointment, , Right Eye, Q4H, Judith Part, MD, Given at 11/13/19 [phone] .  bethanechol  (URECHOLINE) tablet 10 mg, 10 mg, Per Tube, TID, Kipp Brood, MD, 10 mg at 11/12/19 2121 .  chlorhexidine (PERIDEX) 0.12 % solution 15 mL, 15 mL, Mouth Rinse, BID, [place] Levenhagen, Joyice Faster, MD, 15 mL at 11/12/19 2121 .  Chlorhexidine Gluconate Cloth 2 % PADS 6 each, 6 each, Topical, Daily, Judith Part, MD, 6 each at 11/12/19 1032 .  docusate (COLACE) 50 MG/5ML liquid 100 mg, 100 mg, Per Tube, Daily, Merlene Laughter F, NP, 100 mg at 11/10/19 0845 .  feeding supplement (OSMOLITE 1.2 CAL) liquid 1,000 mL, 1,000 mL, Per Tube, Continuous, Mikalah Skyles, Joyice Faster, MD, Last Rate: 75 mL/hr at 11/13/19 0315, 1,000 mL at 11/13/19 0315 .  feeding supplement (PRO-STAT SUGAR FREE 64) liquid 30 mL, 30 mL, Per Tube, BID, Judith Part, MD, 30 mL at 11/12/19 2122 .  free water 200 mL, 200 mL, Per Tube, Q4H, Tobin Witucki A, MD, 200 mL at 11/13/19 0315 .  heparin injection 5,000 Units, 5,000 Units, Subcutaneous, Q8H, Kipp Brood, MD, 5,000 Units at 11/13/19 [phone] .  HYDROcodone-acetaminophen (NORCO/VICODIN) 5-325 MG per tablet 1 tablet, 1 tablet, Per Tube, Q4H PRN, Ashok Pall, MD, 1 tablet at 11/12/19 0946 .  HYDROmorphone (DILAUDID) injection 0.5 mg, 0.5 mg, Intravenous, Q3H PRN, Judith Part, MD, 0.5 mg at  10/19/19 0931 .  ipratropium-albuterol (DUONEB) 0.5-2.5 (3) MG/3ML nebulizer solution 3 mL, 3 mL, Nebulization, Q6H PRN, Jamilia Jacques A, MD .  labetalol (NORMODYNE) injection 10-40 mg, 10-40 mg, Intravenous, Q10 min PRN, Judith Part, MD, 20 mg at 10/18/19 1404 .  lisinopril (ZESTRIL) tablet 5 mg, 5 mg, Per Tube, Daily, Niema Carrara, Joyice Faster, MD, 5 mg at 11/12/19 0947 .  MEDLINE mouth rinse, 15 mL, Mouth Rinse, q12n4p, Latavious Bitter, Joyice Faster, MD, 15 mL at 11/12/19 1654 .  multivitamin liquid 15 mL, 15 mL, Per Tube, Daily, Judith Part, MD, 15 mL at 11/12/19 0946 .  ondansetron (ZOFRAN) tablet 4 mg, 4 mg, Oral, Q4H PRN **OR** ondansetron (ZOFRAN) injection 4 mg, 4 mg,  Intravenous, Q4H PRN, Raylyn Carton A, MD .  pantoprazole sodium (PROTONIX) 40 mg/20 mL oral suspension 40 mg, 40 mg, Per Tube, Daily, Judith Part, MD, 40 mg at 11/12/19 0946 .  polyethylene glycol (MIRALAX / GLYCOLAX) packet 17 g, 17 g, Oral, Daily, Judith Part, MD, 17 g at 11/09/19 0933 .  promethazine (PHENERGAN) tablet 12.5-25 mg, 12.5-25 mg, Per Tube, Q4H PRN, Judith Part, MD .  QUEtiapine (SEROQUEL) tablet 25 mg, 25 mg, Per Tube, QHS, Rigoberto Noel, MD, 25 mg at 11/12/19 2121   Physical Exam: Awakens to voice, interactive, asks questions as much as able, stable diffuse R cranial nerve palsies, FCx4  Assessment & Plan: 64 y.o. 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 MRI with good debulking of tumor, pre-post op measurements show decrease in tumor volume from 19.7cc-->4.3cc. 4/22 progressive obtundation, CTH with post-op resection cavity hemorrhage not present on MRI, brainstem compression and developing obstructive hydrocephalus, taken back to OR for hematoma removal and hemostasis. Unfortunately, as expected, post-op cranial nerve dysfunction after hematoma evacuation despite normal intra-op stimulation. Post-op CTH with improved mass effect on brainstem, slowly improving ventricular size with some IVH, 4/26 CTH stable ventricular size, hemorrhage resolving, 4/26 intubated for aspiration / PNA, 4/27 trach, 4/30 s/p PEG, 5/2 fever, trach aspirate Cx +S auerus, completed 7d course of cefazolin. 5/13 failed repeat MBS, 5/16 Hb drop, +guaiac, 2U pRBC, resolved w/ PPI  Neuro -R eye keratopathy s/p ABx gtt, resolved. Continue lacrilube gtt to right eye q4h -exam stable with delirium -final path cystic schwannoma  Cardiopulm -no active pulm issues, on TC, intermittent capping / PMV with supervision  FENGI -s/p PEG, on TF & 200q4 FWF -foley replaced for retention -failed repeat MBS, continue NPO w/ meds & TF via PEG   Heme/ID -s/p completed  Tx for MSSA PNA, afebrile, no issues -Hb stable, cont PPI  PPx/Dispo -SNF discharge pending on 5/27, ready for discharge when accepted by SNF -SCDs/TEDs, [facility]  Marcello Moores A Antionette Luster  11/13/19 8:49 AM

## 2019-11-13 NOTE — Progress Notes (Signed)
Physical Therapy Treatment Patient Details Name: Casey Richard MRN: [id number] DOB: Jan 04, 1956 Today's Date: 11/13/2019    History of Present Illness 64 y/o male admitted secondary to dizziness, weight loss, and gait instability. 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  MRI confirmed 2 large brain masses. S/p right crani for tumor excision on 10/07/19. PMH includes CKD and complete heart block s/p pacemaker. Neuro status changes s/p evacuation of post operative hematoma on 10/10/19. Patient then with acute hypoxic respiratory distress. CXR with bibasilar opacities with right greater then left, concerning for aspiration, re-intubated 4/26, s/p tracheostomy 4/27.  Trach collar on 4/29.  PEG tube 10/18/19.    PT Comments    Pt tolerates treatment well with increased activity tolerance this session. Pt demonstrates improved stability during gait with BOS inside RW, performing more consistently with fewer PT cues. Pt reports numbness on left side of body with tingling in both feet, incontinent of stool during session as well. Pt with improved tolerance of light this session and better able to maintain eyes open throughout session. Pt performs multiple ADLs at sink in standing to challenge dynamic standing balance. Pt will continue to benefit from acute PT POC to reduce falls risk.  Follow Up Recommendations  SNF     Equipment Recommendations  Rolling walker with 5" wheels;3in1 (PT);Wheelchair (measurements PT);Wheelchair cushion (measurements PT)    Recommendations for Other Services       Precautions / Restrictions Precautions Precautions: Fall;Other (comment) Precaution Comments: trach, PMV, G-tube, cannot hear out of R ear Required Braces or Orthoses: Other Brace Other Brace: abdominal binder for multiple tubes and low BP. Restrictions Weight Bearing Restrictions: No    Mobility  Bed Mobility Overal bed mobility:  Needs Assistance Bed Mobility: Supine to Sit;Rolling Rolling: Supervision   Supine to sit: Supervision Sit to supine: Supervision      Transfers Overall transfer level: Needs assistance Equipment used: Rolling walker (2 wheeled) Transfers: Sit to/from Stand Sit to Stand: Min assist Stand pivot transfers: Min assist          Ambulation/Gait Ambulation/Gait assistance: Mod assist Gait Distance (Feet): 140 Feet(2 additional trials of 10') Assistive device: Rolling walker (2 wheeled) Gait Pattern/deviations: Step-to pattern;Staggering right Gait velocity: reduced Gait velocity interpretation: <1.8 ft/sec, indicate of risk for recurrent falls General Gait Details: pt with one significant LOB requiring modA to correct, multiple minor LOB requiring minA to correct. Pt maintaining BOS within RW well, improving stability   Stairs             Wheelchair Mobility    Modified Rankin (Stroke Patients Only)       Balance Overall balance assessment: Needs assistance Sitting-balance support: Single extremity supported;Feet supported Sitting balance-Leahy Scale: Good Sitting balance - Comments: supervision sitting on bedside commode in bathroom   Standing balance support: Bilateral upper extremity supported Standing balance-Leahy Scale: Poor Standing balance comment: minA for static standing balance with BUE support of RW                            Cognition Arousal/Alertness: Awake/alert Behavior During Therapy: WFL for tasks assessed/performed Overall Cognitive Status: Impaired/Different from baseline Area of Impairment: Memory;Following commands;Safety/judgement;Awareness;Problem solving                     Memory: Decreased recall of precautions;Decreased short-term memory Following Commands: Follows one step commands consistently;Follows multi-step commands  with increased time Safety/Judgement: Decreased awareness of safety;Decreased awareness  of deficits Awareness: Intellectual Problem Solving: Slow processing        Exercises      General Comments General comments (skin integrity, edema, etc.): VSS on 28% FiO2 trach collar      Pertinent Vitals/Pain Pain Assessment: Faces Faces Pain Scale: Hurts little more Pain Location: head Pain Descriptors / Indicators: Aching Pain Intervention(s): Monitored during session    Home Living                      Prior Function            PT Goals (current goals can now be found in the care plan section) Acute Rehab PT Goals Patient Stated Goal: to move better Progress towards PT goals: Progressing toward goals    Frequency    Min 2X/week      PT Plan Current plan remains appropriate    Co-evaluation              AM-PAC PT "6 Clicks" Mobility   Outcome Measure  Help needed turning from your back to your side while in a flat bed without using bedrails?: A Little Help needed moving from lying on your back to sitting on the side of a flat bed without using bedrails?: A Little Help needed moving to and from a bed to a chair (including a wheelchair)?: A Little Help needed standing up from a chair using your arms (e.g., wheelchair or bedside chair)?: A Little Help needed to walk in hospital room?: A Lot Help needed climbing 3-5 steps with a railing? : Total 6 Click Score: 15    End of Session Equipment Utilized During Treatment: Oxygen Activity Tolerance: Patient tolerated treatment well Patient left: in bed;with call bell/phone within reach;with bed alarm set Nurse Communication: Mobility status PT Visit Diagnosis: Muscle weakness (generalized) (M62.81);Difficulty in walking, not elsewhere classified (R26.2)     Time: [id number] PT Time Calculation (min) (ACUTE ONLY): 53 min  Charges:  $Gait Training: 23-37 mins $Therapeutic Activity: 23-37 mins                     Zenaida Niece, PT, DPT Acute Rehabilitation Pager: [phone]    Zenaida Niece 11/13/2019, 1:27 PM

## 2019-11-14 MED ORDER — PRO-STAT SUGAR FREE PO LIQD: 30.0000 mL | Freq: Two times a day (BID) | ORAL | 0 refills | Status: DC

## 2019-11-14 MED ORDER — PANTOPRAZOLE SODIUM 40 MG PO PACK: 40.0000 mg | PACK | Freq: Every day | ORAL | Status: DC

## 2019-11-14 MED ORDER — BETHANECHOL CHLORIDE 10 MG PO TABS: 10.0000 mg | ORAL_TABLET | Freq: Three times a day (TID) | ORAL | Status: DC

## 2019-11-14 MED ORDER — ARTIFICIAL TEARS OPHTHALMIC OINT: TOPICAL_OINTMENT | OPHTHALMIC | Status: AC

## 2019-11-14 MED ORDER — QUETIAPINE FUMARATE 25 MG PO TABS: 25.0000 mg | ORAL_TABLET | Freq: Every day | ORAL | Status: DC

## 2019-11-14 MED ORDER — AMLODIPINE BESYLATE 10 MG PO TABS: 10.0000 mg | ORAL_TABLET | Freq: Every day | ORAL | Status: DC

## 2019-11-14 MED ORDER — OSMOLITE 1.2 CAL PO LIQD: 1000.0000 mL | ORAL | 0 refills | Status: DC

## 2019-11-14 MED ORDER — LISINOPRIL 5 MG PO TABS: 5.0000 mg | ORAL_TABLET | Freq: Every day | ORAL | Status: DC

## 2019-11-14 MED ORDER — DOCUSATE SODIUM 50 MG/5ML PO LIQD: 100.0000 mg | Freq: Every day | ORAL | 0 refills | Status: DC

## 2019-11-14 MED ORDER — HEPARIN SODIUM (PORCINE) 5000 UNIT/ML IJ SOLN: 5000.0000 [IU] | Freq: Three times a day (TID) | INTRAMUSCULAR | Status: DC

## 2019-11-14 MED ORDER — IPRATROPIUM-ALBUTEROL 0.5-2.5 (3) MG/3ML IN SOLN: 3.0000 mL | Freq: Four times a day (QID) | RESPIRATORY_TRACT | Status: DC | PRN

## 2019-11-14 MED ORDER — FREE WATER: 200.0000 mL | Status: DC

## 2019-11-14 NOTE — TOC Progression Note (Signed)
Transition of Care [facility]) - Progression Note    Patient Details  Name: Casey Richard MRN: [id number] Date of Birth: 1956-02-17  Transition of Care [facility]) CM/SW [place], [place] Phone Number: 11/14/2019, 10:06 AM  Clinical Narrative:     Patient's insurance authorization still pending-   Thurmond Butts, MSW, [place] Clinical Social Worker   Expected Discharge Plan: [place] Barriers to Discharge: Ship broker, Continued Medical Work up(SNF require 30 day or more before admit with trach)  Expected Discharge Plan and Services Expected Discharge Plan: [place] In-house Referral: Clinical Social Work     Living arrangements for the past 2 months: Single Family Home Expected Discharge Date: 11/14/19                                     Social Determinants of Health (SDOH) Interventions    Readmission Risk Interventions No flowsheet data found.

## 2019-11-14 NOTE — Discharge Instructions (Signed)
Discharge Instructions  No restriction in activities, slowly increase your activity back to normal.   Okay to shower on the day of discharge. Use regular soap and water and try to be gentle when cleaning your incision. No need for a dressing on your incision. In the first few days after surgery, there may be some bloody drainage from your wound. If this happens, you can cover your incision with a gauze dressing to prevent it from staining your clothes or bed linens. If your incision begins to itch, rub some bacitracin or neosporin ointment on it instead of scratching it.  Follow up with Dr. Zada Finders in 2 weeks after discharge. If you do not already have a discharge appointment, please call his office at [phone] to schedule a follow up appointment. If you have any concerns or questions, please call the office and let us know.

## 2019-11-14 NOTE — Progress Notes (Signed)
Neurosurgery Service Progress Note  Subjective: NAE ON, no new complaints  Objective: Vitals:   11/14/19 0000 11/14/19 0332 11/14/19 0420 11/14/19 0500  BP: 97/73 102/77    Pulse: 76 76    Resp: 14 12    Temp:   97.7 F (36.5 C)   TempSrc:   Oral   SpO2: 98% 99%    Weight:    62.5 kg  Height:       Temp (24hrs), Avg:97.8 F (36.6 C), Min:97.5 F (36.4 C), Max:98.2 F (36.8 C)  CBC Latest Ref Rng & Units 11/06/2019 11/05/2019 11/04/2019  WBC 4.0 - 10.5 K/uL 10.9(H) 12.2(H) 12.5(H)  Hemoglobin 13.0 - 17.0 g/dL 9.5(L) 9.4(L) 9.2(L)  Hematocrit 39.0 - 52.0 % 29.8(L) 29.5(L) 28.9(L)  Platelets 150 - 400 K/uL 265 246 247   BMP Latest Ref Rng & Units 11/08/2019 11/06/2019 11/05/2019  Glucose 70 - 99 mg/dL 124(H) 116(H) 103(H)  BUN 8 - 23 mg/dL 19 14 16   Creatinine 0.61 - 1.24 mg/dL 0.67 0.60(L) 0.62  Sodium 135 - 145 mmol/L 138 135 135  Potassium 3.5 - 5.1 mmol/L 4.4 4.3 4.4  Chloride 98 - 111 mmol/L 101 97(L) 99  CO2 22 - 32 mmol/L 29 30 28   Calcium 8.9 - 10.3 mg/dL 8.7(L) 8.4(L) 8.4(L)    Intake/Output Summary (Last 24 hours) at 11/14/2019 0744 Last data filed at 11/14/2019 [id number] Gross per 24 hour  Intake 17214.02 ml  Output 1250 ml  Net 15964.02 ml    Current Facility-Administered Medications:  .  0.9 %  sodium chloride infusion, , Intravenous, PRN, Judith Part, MD, Stopped at 10/25/19 1416 .  acetaminophen (TYLENOL) 160 MG/5ML solution 650 mg, 650 mg, Per Tube, Q4H PRN, Ashok Pall, MD, 650 mg at 11/13/19 1535 .  [DISCONTINUED] acetaminophen (TYLENOL) tablet 650 mg, 650 mg, Oral, Q4H PRN **OR** acetaminophen (TYLENOL) suppository 650 mg, 650 mg, Rectal, Q4H PRN, Judith Part, MD .  amLODipine (NORVASC) tablet 10 mg, 10 mg, Per Tube, Daily, Agarwala, Ravi, MD, 10 mg at 11/10/19 0846 .  artificial tears (LACRILUBE) ophthalmic ointment, , Right Eye, Q4H, Judith Part, MD, Given at 11/14/19 0602 .  bethanechol (URECHOLINE) tablet 10 mg, 10 mg, Per Tube,  TID, Kipp Brood, MD, 10 mg at 11/13/19 2113 .  chlorhexidine (PERIDEX) 0.12 % solution 15 mL, 15 mL, Mouth Rinse, BID, Tamaiya Bump, Joyice Faster, MD, 15 mL at 11/13/19 2112 .  Chlorhexidine Gluconate Cloth 2 % PADS 6 each, 6 each, Topical, Daily, Judith Part, MD, 6 each at 11/13/19 1010 .  docusate (COLACE) 50 MG/5ML liquid 100 mg, 100 mg, Per Tube, Daily, Merlene Laughter F, NP, 100 mg at 11/13/19 1011 .  feeding supplement (OSMOLITE 1.2 CAL) liquid 1,000 mL, 1,000 mL, Per Tube, Continuous, Kam Rahimi, Joyice Faster, MD, Last Rate: 75 mL/hr at 11/13/19 1720, 1,000 mL at 11/13/19 1720 .  feeding supplement (PRO-STAT SUGAR FREE 64) liquid 30 mL, 30 mL, Per Tube, BID, Judith Part, MD, 30 mL at 11/13/19 2112 .  free water 200 mL, 200 mL, Per Tube, Q4H, Ellesse Antenucci A, MD, 200 mL at 11/14/19 0313 .  heparin injection 5,000 Units, 5,000 Units, Subcutaneous, Q8H, Agarwala, Ravi, MD, 5,000 Units at 11/14/19 0602 .  HYDROcodone-acetaminophen (NORCO/VICODIN) 5-325 MG per tablet 1 tablet, 1 tablet, Per Tube, Q4H PRN, Ashok Pall, MD, 1 tablet at 11/13/19 2115 .  HYDROmorphone (DILAUDID) injection 0.5 mg, 0.5 mg, Intravenous, Q3H PRN, Judith Part, MD, 0.5 mg at 10/19/19 0931 .  ipratropium-albuterol (DUONEB) 0.5-2.5 (3) MG/3ML nebulizer solution 3 mL, 3 mL, Nebulization, Q6H PRN, Maedell Hedger A, MD .  labetalol (NORMODYNE) injection 10-40 mg, 10-40 mg, Intravenous, Q10 min PRN, Judith Part, MD, 20 mg at 10/18/19 1404 .  lisinopril (ZESTRIL) tablet 5 mg, 5 mg, Per Tube, Daily, Whitfield Dulay, Joyice Faster, MD, 5 mg at 11/12/19 0947 .  MEDLINE mouth rinse, 15 mL, Mouth Rinse, q12n4p, Zuriyah Shatz A, MD, 15 mL at 11/13/19 1535 .  multivitamin liquid 15 mL, 15 mL, Per Tube, Daily, Toshia Larkin, Joyice Faster, MD, 15 mL at 11/13/19 1012 .  ondansetron (ZOFRAN) tablet 4 mg, 4 mg, Oral, Q4H PRN **OR** ondansetron (ZOFRAN) injection 4 mg, 4 mg, Intravenous, Q4H PRN, Iran Kievit A, MD .   pantoprazole sodium (PROTONIX) 40 mg/20 mL oral suspension 40 mg, 40 mg, Per Tube, Daily, Bodi Palmeri, Joyice Faster, MD, 40 mg at 11/13/19 1007 .  polyethylene glycol (MIRALAX / GLYCOLAX) packet 17 g, 17 g, Oral, Daily, Judith Part, MD, 17 g at 11/09/19 0933 .  promethazine (PHENERGAN) tablet 12.5-25 mg, 12.5-25 mg, Per Tube, Q4H PRN, Judith Part, MD .  QUEtiapine (SEROQUEL) tablet 25 mg, 25 mg, Per Tube, QHS, Rigoberto Noel, MD, 25 mg at 11/13/19 2113   Physical Exam: Awakens to voice, interactive, asks questions as much as able, stable diffuse R cranial nerve palsies, FCx4  Assessment & Plan: 64 y.o. 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 MRI with good debulking of tumor, pre-post op measurements show decrease in tumor volume from 19.7cc-->4.3cc. 4/22 progressive obtundation, CTH with post-op resection cavity hemorrhage not present on MRI, brainstem compression and developing obstructive hydrocephalus, taken back to OR for hematoma removal and hemostasis. Unfortunately, as expected, post-op cranial nerve dysfunction after hematoma evacuation despite normal intra-op stimulation. Post-op CTH with improved mass effect on brainstem, slowly improving ventricular size with some IVH, 4/26 CTH stable ventricular size, hemorrhage resolving, 4/26 intubated for aspiration / PNA, 4/27 trach, 4/30 s/p PEG, 5/2 fever, trach aspirate Cx +S auerus, completed 7d course of cefazolin. 5/13 failed repeat MBS, 5/16 Hb drop, +guaiac, 2U pRBC, resolved w/ PPI  Neuro -R eye keratopathy s/p ABx gtt, resolved. Continue lacrilube gtt to right eye q4h -exam stable with delirium -final path cystic schwannoma  Cardiopulm -no active pulm issues, on TC, intermittent capping / PMV with supervision  FENGI -s/p PEG, on TF & 200q4 FWF -foley replaced for retention -failed repeat MBS, continue NPO w/ meds & TF via PEG   Heme/ID -s/p completed Tx for MSSA PNA, afebrile, no issues -Hb stable,  cont PPI  PPx/Dispo -should be discharged to SNF today, rpt COVID test negative -SCDs/TEDs, SQH  Judith Part  11/14/19 7:44 AM

## 2019-11-15 ENCOUNTER — Encounter: Payer: Self-pay | Admitting: Radiation Therapy

## 2019-11-15 DIAGNOSIS — I442 Atrioventricular block, complete: Secondary | ICD-10-CM | POA: Diagnosis not present

## 2019-11-15 DIAGNOSIS — D333 Benign neoplasm of cranial nerves: Secondary | ICD-10-CM | POA: Diagnosis not present

## 2019-11-15 DIAGNOSIS — W19XXXA Unspecified fall, initial encounter: Secondary | ICD-10-CM | POA: Diagnosis not present

## 2019-11-15 DIAGNOSIS — G9389 Other specified disorders of brain: Secondary | ICD-10-CM | POA: Diagnosis not present

## 2019-11-15 DIAGNOSIS — Z930 Tracheostomy status: Secondary | ICD-10-CM | POA: Diagnosis not present

## 2019-11-15 DIAGNOSIS — Z743 Need for continuous supervision: Secondary | ICD-10-CM | POA: Diagnosis not present

## 2019-11-15 DIAGNOSIS — E43 Unspecified severe protein-calorie malnutrition: Secondary | ICD-10-CM | POA: Diagnosis not present

## 2019-11-15 DIAGNOSIS — Z23 Encounter for immunization: Secondary | ICD-10-CM | POA: Diagnosis not present

## 2019-11-15 DIAGNOSIS — J9600 Acute respiratory failure, unspecified whether with hypoxia or hypercapnia: Secondary | ICD-10-CM | POA: Diagnosis not present

## 2019-11-15 DIAGNOSIS — Z950 Presence of cardiac pacemaker: Secondary | ICD-10-CM | POA: Diagnosis not present

## 2019-11-15 DIAGNOSIS — N319 Neuromuscular dysfunction of bladder, unspecified: Secondary | ICD-10-CM | POA: Diagnosis not present

## 2019-11-15 DIAGNOSIS — I472 Ventricular tachycardia: Secondary | ICD-10-CM | POA: Diagnosis not present

## 2019-11-15 DIAGNOSIS — D62 Acute posthemorrhagic anemia: Secondary | ICD-10-CM | POA: Diagnosis not present

## 2019-11-15 DIAGNOSIS — R279 Unspecified lack of coordination: Secondary | ICD-10-CM | POA: Diagnosis not present

## 2019-11-15 MED ORDER — ISOSOURCE 1.5 CAL PO LIQD: 1000.0000 mL | Freq: Every day | ORAL | 0 refills | Status: DC

## 2019-11-15 NOTE — Progress Notes (Signed)
Mr. Brenda will follow with Dr.Ostergard throughout his recovery process. If post op radiosurgery is needed Dr. Zada Finders will refer the patient to [place] at that time.  Mont Dutton R.T.(R)(T) Radiation Special Procedures Navigator

## 2019-11-15 NOTE — TOC Transition Note (Signed)
Transition of Care [facility]) - CM/SW Discharge Note   Patient Details  Name: Casey Richard MRN: [id number] Date of Birth: [birth year]  [facility] CM/SW Contact:  Vinie Sill, [place] Phone Number: 11/15/2019, 2:28 PM   Clinical Narrative:     Patient will DC to: [place] Date: 11/15/2019 Family Notified: Garth,brother Transport [id number]   Per MD patient is ready for discharge. RN, patient, and facility notified of DC. Discharge Summary sent to facility. RN given number for [phone]. Ambulance transport requested for patient.   Clinical Social Worker signing off.  Thurmond Butts, MSW, [place] Clinical Social Worker    Final next level of care: Skilled Nursing Facility Barriers to Discharge: Barriers Resolved   Patient Goals and CMS Choice        Discharge Placement              Patient chooses bed at: [facility] Patient to be transferred to facility by: [place] Name of family member notified: Garth,brother Patient and family notified of of transfer: 11/15/19  Discharge Plan and Services In-house Referral: Clinical Social Work                                   Social Determinants of Health (SDOH) Interventions     Readmission Risk Interventions No flowsheet data found.

## 2019-11-15 NOTE — Progress Notes (Signed)
Neurosurgery Service Progress Note  Subjective: NAE ON, no new complaints, more interactive today  Objective: Vitals:   11/15/19 0359 11/15/19 0400 11/15/19 0500 11/15/19 0744  BP:  109/68  92/60  Pulse:  87  78  Resp:  (!) 23  (!) 22  Temp:   98 F (36.7 C) 97.8 F (36.6 C)  TempSrc:   Oral Oral  SpO2:  96%  100%  Weight: 62.6 kg     Height:       Temp (24hrs), Avg:97.9 F (36.6 C), Min:97.7 F (36.5 C), Max:98.3 F (36.8 C)  CBC Latest Ref Rng & Units 11/06/2019 11/05/2019 11/04/2019  WBC 4.0 - 10.5 K/uL 10.9(H) 12.2(H) 12.5(H)  Hemoglobin 13.0 - 17.0 g/dL 9.5(L) 9.4(L) 9.2(L)  Hematocrit 39.0 - 52.0 % 29.8(L) 29.5(L) 28.9(L)  Platelets 150 - 400 K/uL 265 246 247   BMP Latest Ref Rng & Units 11/08/2019 11/06/2019 11/05/2019  Glucose 70 - 99 mg/dL 124(H) 116(H) 103(H)  BUN 8 - 23 mg/dL 19 14 16   Creatinine 0.61 - 1.24 mg/dL 0.67 0.60(L) 0.62  Sodium 135 - 145 mmol/L 138 135 135  Potassium 3.5 - 5.1 mmol/L 4.4 4.3 4.4  Chloride 98 - 111 mmol/L 101 97(L) 99  CO2 22 - 32 mmol/L 29 30 28   Calcium 8.9 - 10.3 mg/dL 8.7(L) 8.4(L) 8.4(L)    Intake/Output Summary (Last 24 hours) at 11/15/2019 0853 Last data filed at 11/15/2019 0600 Gross per 24 hour  Intake 3000 ml  Output 1750 ml  Net 1250 ml    Current Facility-Administered Medications:  .  0.9 %  sodium chloride infusion, , Intravenous, PRN, Judith Part, MD, Stopped at 10/25/19 1416 .  acetaminophen (TYLENOL) 160 MG/5ML solution 650 mg, 650 mg, Per Tube, Q4H PRN, Ashok Pall, MD, 650 mg at 11/14/19 1445 .  [DISCONTINUED] acetaminophen (TYLENOL) tablet 650 mg, 650 mg, Oral, Q4H PRN **OR** acetaminophen (TYLENOL) suppository 650 mg, 650 mg, Rectal, Q4H PRN, Judith Part, MD .  amLODipine (NORVASC) tablet 10 mg, 10 mg, Per Tube, Daily, Agarwala, Ravi, MD, 10 mg at 11/14/19 0958 .  artificial tears (LACRILUBE) ophthalmic ointment, , Right Eye, Q4H, Judith Part, MD, Given at 11/15/19 [phone] .  bethanechol  (URECHOLINE) tablet 10 mg, 10 mg, Per Tube, TID, Kipp Brood, MD, 10 mg at 11/14/19 2056 .  chlorhexidine (PERIDEX) 0.12 % solution 15 mL, 15 mL, Mouth Rinse, BID, Starr Engel, Joyice Faster, MD, 15 mL at 11/14/19 2054 .  Chlorhexidine Gluconate Cloth 2 % PADS 6 each, 6 each, Topical, Daily, Judith Part, MD, 6 each at 11/14/19 [phone] .  docusate (COLACE) 50 MG/5ML liquid 100 mg, 100 mg, Per Tube, Daily, Merlene Laughter F, NP, 100 mg at 11/14/19 0958 .  feeding supplement (OSMOLITE 1.2 CAL) liquid 1,000 mL, 1,000 mL, Per Tube, Continuous, Hamlin Devine, Joyice Faster, MD, Last Rate: 75 mL/hr at 11/14/19 1000, 1,000 mL at 11/14/19 1000 .  feeding supplement (PRO-STAT SUGAR FREE 64) liquid 30 mL, 30 mL, Per Tube, BID, Kydan Shanholtzer A, MD, 30 mL at 11/14/19 2055 .  free water 200 mL, 200 mL, Per Tube, Q4H, Madelynne Lasker, Joyice Faster, MD, 200 mL at 11/15/19 0358 .  heparin injection 5,000 Units, 5,000 Units, Subcutaneous, Q8H, Kipp Brood, MD, 5,000 Units at 11/15/19 [id number] .  HYDROcodone-acetaminophen (NORCO/VICODIN) 5-325 MG per tablet 1 tablet, 1 tablet, Per Tube, Q4H PRN, Ashok Pall, MD, 1 tablet at 11/13/19 2115 .  HYDROmorphone (DILAUDID) injection 0.5 mg, 0.5 mg, Intravenous, Q3H PRN, Emelda Brothers  A, MD, 0.5 mg at 10/19/19 0931 .  ipratropium-albuterol (DUONEB) 0.5-2.5 (3) MG/3ML nebulizer solution 3 mL, 3 mL, Nebulization, Q6H PRN, Nayleah Gamel A, MD .  labetalol (NORMODYNE) injection 10-40 mg, 10-40 mg, Intravenous, Q10 min PRN, Judith Part, MD, 20 mg at 10/18/19 1404 .  lisinopril (ZESTRIL) tablet 5 mg, 5 mg, Per Tube, Daily, Tosh Glaze, Joyice Faster, MD, 5 mg at 11/14/19 0958 .  MEDLINE mouth rinse, 15 mL, Mouth Rinse, q12n4p, Lus Kriegel A, MD, 15 mL at 11/14/19 1700 .  multivitamin liquid 15 mL, 15 mL, Per Tube, Daily, Judith Part, MD, 15 mL at 11/14/19 0958 .  ondansetron (ZOFRAN) tablet 4 mg, 4 mg, Oral, Q4H PRN **OR** ondansetron (ZOFRAN) injection 4 mg, 4 mg,  Intravenous, Q4H PRN, Allecia Bells A, MD .  pantoprazole sodium (PROTONIX) 40 mg/20 mL oral suspension 40 mg, 40 mg, Per Tube, Daily, Judith Part, MD, 40 mg at 11/14/19 0958 .  polyethylene glycol (MIRALAX / GLYCOLAX) packet 17 g, 17 g, Oral, Daily, Judith Part, MD, 17 g at 11/14/19 0958 .  promethazine (PHENERGAN) tablet 12.5-25 mg, 12.5-25 mg, Per Tube, Q4H PRN, Judith Part, MD .  QUEtiapine (SEROQUEL) tablet 25 mg, 25 mg, Per Tube, QHS, Rigoberto Noel, MD, 25 mg at 11/14/19 2056   Physical Exam: Awakens to voice, interactive, asks questions as much as able, stable diffuse R cranial nerve palsies, FCx4, incision c/d/i and healing well  Assessment & Plan: 64 y.o. 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 MRI with good debulking of tumor, pre-post op measurements show decrease in tumor volume from 19.7cc-->4.3cc. 4/22 progressive obtundation, CTH with post-op resection cavity hemorrhage not present on MRI, brainstem compression and developing obstructive hydrocephalus, taken back to OR for hematoma removal and hemostasis. Unfortunately, as expected, post-op cranial nerve dysfunction after hematoma evacuation despite normal intra-op stimulation. Post-op CTH with improved mass effect on brainstem, slowly improving ventricular size with some IVH, 4/26 CTH stable ventricular size, hemorrhage resolving, 4/26 intubated for aspiration / PNA, 4/27 trach, 4/30 s/p PEG, 5/2 fever, trach aspirate Cx +S auerus, completed 7d course of cefazolin. 5/13 failed repeat MBS, 5/16 Hb drop, +guaiac, 2U pRBC, resolved w/ PPI  Neuro -R eye keratopathy s/p ABx gtt, resolved. Continue lacrilube gtt to right eye q4h -final path cystic schwannoma  Cardiopulm -no active pulm issues, on TC, intermittent capping / PMV with supervision  FENGI -s/p PEG, on TF & 200q4 FWF -foley replaced for retention -failed repeat MBS, continue NPO w/ meds & TF via PEG   Heme/ID -s/p  completed Tx for MSSA PNA, afebrile, no issues -Hb stable, cont PPI  PPx/Dispo -SNF discharge pending insurance, rpt COVID test negative -SCDs/TEDs, SQH  Judith Part  11/15/19 8:53 AM

## 2019-11-22 ENCOUNTER — Telehealth: Payer: Self-pay

## 2019-11-22 NOTE — Telephone Encounter (Signed)
Unable to speak  with patient to remind of missed remote transmission 

## 2019-12-05 DIAGNOSIS — J9621 Acute and chronic respiratory failure with hypoxia: Secondary | ICD-10-CM | POA: Diagnosis not present

## 2019-12-05 DIAGNOSIS — Z930 Tracheostomy status: Secondary | ICD-10-CM | POA: Diagnosis not present

## 2019-12-05 DIAGNOSIS — G529 Cranial nerve disorder, unspecified: Secondary | ICD-10-CM | POA: Diagnosis not present

## 2019-12-05 DIAGNOSIS — D3610 Benign neoplasm of peripheral nerves and autonomic nervous system, unspecified: Secondary | ICD-10-CM | POA: Diagnosis not present

## 2019-12-11 ENCOUNTER — Other Ambulatory Visit (HOSPITAL_COMMUNITY): Payer: Self-pay

## 2019-12-11 DIAGNOSIS — R1310 Dysphagia, unspecified: Secondary | ICD-10-CM

## 2019-12-17 ENCOUNTER — Ambulatory Visit (HOSPITAL_COMMUNITY)
Admission: RE | Admit: 2019-12-17 | Discharge: 2019-12-17 | Disposition: A | Discharge status: Home / Self Care | Payer: Medicare Other | Source: Ambulatory Visit | Attending: Internal Medicine | Admitting: Internal Medicine

## 2019-12-17 ENCOUNTER — Other Ambulatory Visit: Payer: Self-pay

## 2019-12-17 DIAGNOSIS — R05 Cough: Secondary | ICD-10-CM | POA: Diagnosis not present

## 2019-12-17 DIAGNOSIS — R1310 Dysphagia, unspecified: Secondary | ICD-10-CM | POA: Insufficient documentation

## 2019-12-17 NOTE — Progress Notes (Signed)
Modified Barium Swallow Progress Note  Patient Details  Name: Casey Richard MRN: [id number] Date of Birth: 1955/12/27  Today's Date: 12/17/2019  Modified Barium Swallow completed.  Full report located under Chart Review in the Imaging Section.  Brief recommendations include the following:  Clinical Impression  Pt presents with improving dysphagia, although still limited by his ability to clear his pharynx. Thin liquids spill to the pyriform sinuses prior to swallow initiation, and although movement of the hyolaryngeal complex is still quite impaired, he is now able to achieve more clearance while still maintaining his airway protection. Trace, transient penetration occurs, with a throat clear initiated spontaneously when it did touch his vocal folds x1. There is a moderate amount of residue above his UES, but a second swallow clears the majority of this residue. Pt does not have the same ability to clear even purees, which leave more severe residue in the pharynx. A variety of head turns/tucks were attempted but did not improve efficiency of swallow. Liquid washes were provided but could never fully clear the pharyngeal residue, and once his pyriform sinuses were filled with purees, he began aspirating as the liquids and purees mixed in his pharynx. Of note, pt also acknowledged that he could not feel any of the residue, even when it was in larger volumes. Would consider starting thin liquids with use of two swallows per bolus, but would recommend continuing primary nutrition via alternative means. Results and recommendations were reviewed with pt in real time and after the study, with written education also provided.    Swallow Evaluation Recommendations       SLP Diet Recommendations: Thin liquid   Liquid Administration via: Cup   Medication Administration: Via alternative means   Supervision: Full supervision/cueing for compensatory strategies;Patient able to self feed   Compensations:  Slow rate;Small sips/bites;Clear throat intermittently;Multiple dry swallows after each bite/sip   Postural Changes: Seated upright at 90 degrees;Remain semi-upright after after feeds/meals (Comment)   Oral Care Recommendations: Oral care QID   Other Recommendations: Place PMSV during PO intake    Osie Bond., M.A. [place] Pager [phone] Office [phone]  12/17/2019,2:13 PM

## 2019-12-20 DIAGNOSIS — G510 Bell's palsy: Secondary | ICD-10-CM | POA: Diagnosis not present

## 2019-12-20 DIAGNOSIS — H16201 Unspecified keratoconjunctivitis, right eye: Secondary | ICD-10-CM | POA: Diagnosis not present

## 2019-12-24 ENCOUNTER — Ambulatory Visit (INDEPENDENT_AMBULATORY_CARE_PROVIDER_SITE_OTHER): Payer: Medicare Other | Admitting: *Deleted

## 2019-12-24 DIAGNOSIS — I442 Atrioventricular block, complete: Secondary | ICD-10-CM | POA: Diagnosis not present

## 2019-12-25 DIAGNOSIS — D333 Benign neoplasm of cranial nerves: Secondary | ICD-10-CM | POA: Diagnosis not present

## 2019-12-25 DIAGNOSIS — R1312 Dysphagia, oropharyngeal phase: Secondary | ICD-10-CM | POA: Diagnosis not present

## 2019-12-26 DIAGNOSIS — R1312 Dysphagia, oropharyngeal phase: Secondary | ICD-10-CM | POA: Diagnosis not present

## 2019-12-26 DIAGNOSIS — D333 Benign neoplasm of cranial nerves: Secondary | ICD-10-CM | POA: Diagnosis not present

## 2019-12-26 LAB — CUP PACEART REMOTE DEVICE CHECK
Battery Voltage: 2.82 V
Brady Statistic AP VP Percent: 11.71 %
Brady Statistic AP VS Percent: 0 %
Brady Statistic AS VP Percent: 87.98 %
Brady Statistic AS VS Percent: 0.3 %
Brady Statistic RA Percent Paced: 11.64 %
Brady Statistic RV Percent Paced: 99.36 %
Date Time Interrogation Session: 20210707161042
Implantable Lead Implant Date: 20130125
Implantable Lead Implant Date: 20130125
Implantable Lead Location: 753859
Implantable Lead Location: 753860
Implantable Lead Model: 5076
Implantable Pulse Generator Implant Date: 20130125
Lead Channel Impedance Value: 392 Ohm
Lead Channel Impedance Value: 416 Ohm
Lead Channel Sensing Intrinsic Amplitude: 3.092 mV
Lead Channel Setting Pacing Amplitude: 2 V
Lead Channel Setting Pacing Amplitude: 2.5 V
Lead Channel Setting Pacing Pulse Width: 0.4 ms
Lead Channel Setting Sensing Sensitivity: 2.1 mV

## 2019-12-26 NOTE — Progress Notes (Signed)
Remote pacemaker transmission.   

## 2019-12-27 DIAGNOSIS — G510 Bell's palsy: Secondary | ICD-10-CM | POA: Diagnosis not present

## 2019-12-29 DIAGNOSIS — D333 Benign neoplasm of cranial nerves: Secondary | ICD-10-CM | POA: Diagnosis not present

## 2019-12-29 DIAGNOSIS — R1312 Dysphagia, oropharyngeal phase: Secondary | ICD-10-CM | POA: Diagnosis not present

## 2019-12-30 DIAGNOSIS — R1312 Dysphagia, oropharyngeal phase: Secondary | ICD-10-CM | POA: Diagnosis not present

## 2019-12-30 DIAGNOSIS — D333 Benign neoplasm of cranial nerves: Secondary | ICD-10-CM | POA: Diagnosis not present

## 2020-01-02 DIAGNOSIS — H02231 Paralytic lagophthalmos right upper eyelid: Secondary | ICD-10-CM | POA: Diagnosis not present

## 2020-01-02 DIAGNOSIS — D333 Benign neoplasm of cranial nerves: Secondary | ICD-10-CM | POA: Diagnosis not present

## 2020-01-02 DIAGNOSIS — S0451XD Injury of facial nerve, right side, subsequent encounter: Secondary | ICD-10-CM | POA: Diagnosis not present

## 2020-01-02 DIAGNOSIS — R1312 Dysphagia, oropharyngeal phase: Secondary | ICD-10-CM | POA: Diagnosis not present

## 2020-01-02 DIAGNOSIS — H16211 Exposure keratoconjunctivitis, right eye: Secondary | ICD-10-CM | POA: Diagnosis not present

## 2020-01-02 DIAGNOSIS — H02152 Paralytic ectropion of right lower eyelid: Secondary | ICD-10-CM | POA: Diagnosis not present

## 2020-01-02 DIAGNOSIS — H02532 Eyelid retraction right lower eyelid: Secondary | ICD-10-CM | POA: Diagnosis not present

## 2020-01-03 DIAGNOSIS — D333 Benign neoplasm of cranial nerves: Secondary | ICD-10-CM | POA: Diagnosis not present

## 2020-01-03 DIAGNOSIS — R1312 Dysphagia, oropharyngeal phase: Secondary | ICD-10-CM | POA: Diagnosis not present

## 2020-01-06 DIAGNOSIS — R1312 Dysphagia, oropharyngeal phase: Secondary | ICD-10-CM | POA: Diagnosis not present

## 2020-01-06 DIAGNOSIS — D333 Benign neoplasm of cranial nerves: Secondary | ICD-10-CM | POA: Diagnosis not present

## 2020-01-07 DIAGNOSIS — R1312 Dysphagia, oropharyngeal phase: Secondary | ICD-10-CM | POA: Diagnosis not present

## 2020-01-07 DIAGNOSIS — D333 Benign neoplasm of cranial nerves: Secondary | ICD-10-CM | POA: Diagnosis not present

## 2020-01-08 ENCOUNTER — Other Ambulatory Visit: Payer: Self-pay

## 2020-01-08 DIAGNOSIS — D333 Benign neoplasm of cranial nerves: Secondary | ICD-10-CM | POA: Diagnosis not present

## 2020-01-08 DIAGNOSIS — R1312 Dysphagia, oropharyngeal phase: Secondary | ICD-10-CM | POA: Diagnosis not present

## 2020-01-08 DIAGNOSIS — I83893 Varicose veins of bilateral lower extremities with other complications: Secondary | ICD-10-CM

## 2020-01-09 DIAGNOSIS — R1312 Dysphagia, oropharyngeal phase: Secondary | ICD-10-CM | POA: Diagnosis not present

## 2020-01-09 DIAGNOSIS — J9621 Acute and chronic respiratory failure with hypoxia: Secondary | ICD-10-CM | POA: Diagnosis not present

## 2020-01-09 DIAGNOSIS — F5101 Primary insomnia: Secondary | ICD-10-CM | POA: Diagnosis not present

## 2020-01-09 DIAGNOSIS — Z1383 Encounter for screening for respiratory disorder NEC: Secondary | ICD-10-CM | POA: Diagnosis not present

## 2020-01-09 DIAGNOSIS — G529 Cranial nerve disorder, unspecified: Secondary | ICD-10-CM | POA: Diagnosis not present

## 2020-01-09 DIAGNOSIS — Z930 Tracheostomy status: Secondary | ICD-10-CM | POA: Diagnosis not present

## 2020-01-09 DIAGNOSIS — D333 Benign neoplasm of cranial nerves: Secondary | ICD-10-CM | POA: Diagnosis not present

## 2020-01-09 DIAGNOSIS — D3610 Benign neoplasm of peripheral nerves and autonomic nervous system, unspecified: Secondary | ICD-10-CM | POA: Diagnosis not present

## 2020-01-10 DIAGNOSIS — D333 Benign neoplasm of cranial nerves: Secondary | ICD-10-CM | POA: Diagnosis not present

## 2020-01-10 DIAGNOSIS — R1312 Dysphagia, oropharyngeal phase: Secondary | ICD-10-CM | POA: Diagnosis not present

## 2020-01-13 DIAGNOSIS — R1312 Dysphagia, oropharyngeal phase: Secondary | ICD-10-CM | POA: Diagnosis not present

## 2020-01-13 DIAGNOSIS — D333 Benign neoplasm of cranial nerves: Secondary | ICD-10-CM | POA: Diagnosis not present

## 2020-01-14 DIAGNOSIS — D333 Benign neoplasm of cranial nerves: Secondary | ICD-10-CM | POA: Diagnosis not present

## 2020-01-14 DIAGNOSIS — R1312 Dysphagia, oropharyngeal phase: Secondary | ICD-10-CM | POA: Diagnosis not present

## 2020-01-15 DIAGNOSIS — R1312 Dysphagia, oropharyngeal phase: Secondary | ICD-10-CM | POA: Diagnosis not present

## 2020-01-15 DIAGNOSIS — D333 Benign neoplasm of cranial nerves: Secondary | ICD-10-CM | POA: Diagnosis not present

## 2020-01-16 DIAGNOSIS — D62 Acute posthemorrhagic anemia: Secondary | ICD-10-CM | POA: Diagnosis not present

## 2020-01-16 DIAGNOSIS — Z13220 Encounter for screening for lipoid disorders: Secondary | ICD-10-CM | POA: Diagnosis not present

## 2020-01-16 DIAGNOSIS — Z0001 Encounter for general adult medical examination with abnormal findings: Secondary | ICD-10-CM | POA: Diagnosis not present

## 2020-01-16 DIAGNOSIS — D333 Benign neoplasm of cranial nerves: Secondary | ICD-10-CM | POA: Diagnosis not present

## 2020-01-16 DIAGNOSIS — R1312 Dysphagia, oropharyngeal phase: Secondary | ICD-10-CM | POA: Diagnosis not present

## 2020-01-16 DIAGNOSIS — R7303 Prediabetes: Secondary | ICD-10-CM | POA: Diagnosis not present

## 2020-01-16 DIAGNOSIS — I10 Essential (primary) hypertension: Secondary | ICD-10-CM | POA: Diagnosis not present

## 2020-01-16 DIAGNOSIS — E874 Mixed disorder of acid-base balance: Secondary | ICD-10-CM | POA: Diagnosis not present

## 2020-01-16 DIAGNOSIS — E559 Vitamin D deficiency, unspecified: Secondary | ICD-10-CM | POA: Diagnosis not present

## 2020-01-16 DIAGNOSIS — Z131 Encounter for screening for diabetes mellitus: Secondary | ICD-10-CM | POA: Diagnosis not present

## 2020-01-17 DIAGNOSIS — D333 Benign neoplasm of cranial nerves: Secondary | ICD-10-CM | POA: Diagnosis not present

## 2020-01-17 DIAGNOSIS — R1312 Dysphagia, oropharyngeal phase: Secondary | ICD-10-CM | POA: Diagnosis not present

## 2020-01-20 ENCOUNTER — Other Ambulatory Visit: Payer: Self-pay

## 2020-01-20 ENCOUNTER — Ambulatory Visit (INDEPENDENT_AMBULATORY_CARE_PROVIDER_SITE_OTHER): Payer: Medicare Other | Admitting: Physician Assistant

## 2020-01-20 ENCOUNTER — Ambulatory Visit (HOSPITAL_COMMUNITY)
Admission: RE | Admit: 2020-01-20 | Discharge: 2020-01-20 | Disposition: A | Discharge status: Home / Self Care | Payer: Medicare Other | Source: Ambulatory Visit | Attending: Surgery | Admitting: Surgery

## 2020-01-20 VITALS — BP 121/78 | HR 70 | Temp 97.8°F | Resp 20 | Ht 70.0 in | Wt 144.3 lb

## 2020-01-20 DIAGNOSIS — I83893 Varicose veins of bilateral lower extremities with other complications: Secondary | ICD-10-CM

## 2020-01-20 NOTE — Progress Notes (Signed)
Requested by:  Raelyn Number, PA [facility] Reason for consultation: varicose veins    History of Present Illness   Casey Richard is a 64 y.o. ([birth year]) male who presents for evaluation of bilateral lower extremity varicose veins and edema.  He was evaluated by his primary care provider on September 16, 2019 for "edema and painful varicose veins of the bilateral lower extremities".  The patient is accompanied today by a friend.  He recently was diagnosed with a brain tumor and underwent on October 07, 2019.  His postoperative course was complicated by aspiration pneumonia, placed on mechanical ventilation and required tracheostomy and PEG placement.  He is currently rehabilitating at [facility].  The patient's primary complaint today is lower extremity edema which is mild.  He denies pain with walking or rest pain.  Venous symptoms include: No lower extremity pain, bleeding or ulcers  onset/duration: 3 to 6 months Occupation: Unemployed, previous Nature conservation officer.  Currently at rehab center Aggravating factors: Sitting and standing Alleviating factors: Elevation  compression: Not used helps:   Pain medications: None Previous vein procedures: None History of DVT: None  Past Medical History:  Diagnosis Date  . Cardiac arrest ([place])   . CHB (complete heart block) ([place])   . 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 Dysrhythmia 1/13   bradycardia, VF, cardiac arrest/complete heart block with pacer inserted/LOV Dr Sallyanne Kuster 07/21/11 with interrogation and anesthesia guideline order on chart. Chest x ray 1/13 EPIC,, TEE and [facility]  1/13 EPIC  . Memory loss, short term    from fall with heart block 1/13  . Nonsustained ventricular tachycardia ([place]) 02/07/2014  . Presence of permanent cardiac pacemaker 07/15/2011   Medtronic Revo  . Seasonal allergies   . Shortness of breath     Past Surgical History:  Procedure Laterality Date  .  CARDIAC CATHETERIZATION  07/09/2011   Normal coronaries  . CRANIOTOMY Right 10/07/2019   Procedure: RIGHT CRANIOTOMY TUMOR EXCISION;  Surgeon: Judith Part, MD;  Location: [place];  Service: Neurosurgery;  Laterality: Right;  . CRANIOTOMY Right 10/10/2019   Procedure: CRANIOTOMY FOR EVACUATION OF HEMATOMA;  Surgeon: Judith Part, MD;  Location: [place];  Service: Neurosurgery;  Laterality: Right;  CRANIOTOMY TUMOR EXCISION  . CYSTOSCOPY WITH URETHRAL DILATATION  08/19/2011   Procedure: CYSTOSCOPY WITH URETHRAL DILATATION;  Surgeon: Dutch Gray, MD;  Location: [facility];  Service: Urology;  Laterality: N/A;  Balloon Dilation of Urethral Stricture, retro-urethrogram, Suprapubic change   . ESOPHAGOGASTRODUODENOSCOPY N/A 10/18/2019   Procedure: ESOPHAGOGASTRODUODENOSCOPY (EGD);  Surgeon: Jesusita Oka, MD;  Location: [facility] ENDOSCOPY;  Service: Endoscopy;  Laterality: N/A;  . LEFT HEART CATHETERIZATION WITH CORONARY ANGIOGRAM N/A 07/09/2011   Procedure: LEFT HEART CATHETERIZATION WITH CORONARY ANGIOGRAM;  Surgeon: Jettie Booze, MD;  Location: [facility] CATH LAB;  Service: Cardiovascular;  Laterality: N/A;  . PACEMAKER INSERTION  07/15/11   Medtronic Revo  . PEG PLACEMENT N/A 10/18/2019   Procedure: PERCUTANEOUS ENDOSCOPIC GASTROSTOMY (PEG) PLACEMENT;  Surgeon: Jesusita Oka, MD;  Location: [place] ENDOSCOPY;  Service: Endoscopy;  Laterality: N/A;  . PERMANENT PACEMAKER INSERTION Left 07/15/2011   Procedure: PERMANENT PACEMAKER INSERTION;  Surgeon: Sanda Klein, MD;  Location: [place] CATH LAB;  Service: Cardiovascular;  Laterality: Left;  . TEMPORARY PACEMAKER INSERTION Right 07/09/2011   Procedure: TEMPORARY PACEMAKER INSERTION;  Surgeon: Jettie Booze, MD;  Location: [facility] CATH LAB;  Service: Cardiovascular;  Laterality: Right;  Social History   Socioeconomic History  . Marital status: Widowed    Spouse name: Not on file  . Number of children: Not on file  . Years of education: Not on file  .  Highest education level: Not on file  Occupational History  . Not on file  Tobacco Use  . Smoking status: Former Smoker    Types: Cigarettes    Quit date: 06/20/1989    Years since quitting: 30.6  . Smokeless tobacco: Never Used  Vaping Use  . Vaping Use: Never used  Substance and Sexual Activity  . Alcohol use: No  . Drug use: No  . Sexual activity: Not on file  Other Topics Concern  . Not on file  Social History Narrative  . Not on file   Social Determinants of Health   Financial Resource Strain:   . Difficulty of Paying Living Expenses:   Food Insecurity:   . Worried About Charity fundraiser in the Last Year:   . Arboriculturist in the Last Year:   Transportation Needs:   . Film/video editor (Medical):   Marland Kitchen Lack of Transportation (Non-Medical):   Physical Activity:   . Days of Exercise per Week:   . Minutes of Exercise per Session:   Stress:   . Feeling of Stress :   Social Connections:   . Frequency of Communication with Friends and Family:   . Frequency of Social Gatherings with Friends and Family:   . Attends Religious Services:   . Active Member of Clubs or Organizations:   . Attends Archivist Meetings:   Marland Kitchen Marital Status:   Intimate Partner Violence:   . Fear of Current or Ex-Partner:   . Emotionally Abused:   Marland Kitchen Physically Abused:   . Sexually Abused:     Family History  Problem Relation Age of Onset  . Coronary artery disease Mother        AICD  . COPD Father   . Coronary artery disease Brother     Current Outpatient Medications  Medication Sig Dispense Refill  . acetaminophen (TYLENOL) 500 MG tablet Take 1,000 mg by mouth every 6 (six) hours as needed. Pain     . Amino Acids-Protein Hydrolys (FEEDING SUPPLEMENT, PRO-STAT SUGAR FREE 64,) LIQD Place 30 mLs into feeding tube 2 (two) times daily. 887 mL 0  . amLODipine (NORVASC) 10 MG tablet Place 1 tablet (10 mg total) into feeding tube daily.    Marland Kitchen artificial tears (LACRILUBE) OINT  ophthalmic ointment Place into the right eye every 4 (four) hours.    . bethanechol (URECHOLINE) 10 MG tablet Place 1 tablet (10 mg total) into feeding tube 3 (three) times daily.    . Cholecalciferol (HM VITAMIN D3) 4000 units CAPS Take 1 capsule by mouth daily.    . cyanocobalamin 2000 MCG tablet Take 2,000 mcg by mouth daily.    Marland Kitchen docusate (COLACE) 50 MG/5ML liquid Place 10 mLs (100 mg total) into feeding tube daily. 100 mL 0  . heparin 5000 UNIT/ML injection Inject 1 mL (5,000 Units total) into the skin every 8 (eight) hours. 1 mL   . ipratropium-albuterol (DUONEB) 0.5-2.5 (3) MG/3ML SOLN Take 3 mLs by nebulization every 6 (six) hours as needed. 360 mL   . lisinopril (ZESTRIL) 5 MG tablet Place 1 tablet (5 mg total) into feeding tube daily.    . Multiple Vitamin (MULITIVITAMIN WITH MINERALS) TABS Take 1 tablet by mouth daily.     Marland Kitchen  Nutritional Supplements (ISOSOURCE 1.5 CAL) LIQD 1,000 mLs by PEG Tube route daily.  0  . pantoprazole sodium (PROTONIX) 40 mg/20 mL PACK Place 20 mLs (40 mg total) into feeding tube daily. 30 mL   . QUEtiapine (SEROQUEL) 25 MG tablet Place 1 tablet (25 mg total) into feeding tube at bedtime.    . Water For Irrigation, Sterile (FREE WATER) SOLN Place 200 mLs into feeding tube every 4 (four) hours.     No current facility-administered medications for this visit.    No Known Allergies  REVIEW OF SYSTEMS (negative unless checked):   Cardiac:  []  Chest pain or chest pressure? []  Shortness of breath upon activity? []  Shortness of breath when lying flat? []  Irregular heart rhythm?  Vascular:  []  Pain in calf, thigh, or hip brought on by walking? []  Pain in feet at night that wakes you up from your sleep? []  Blood clot in your veins? [x]  Leg swelling?  Pulmonary:  []  Oxygen at home? []  Productive cough? []  Wheezing?  Neurologic:  []  Sudden weakness in arms or legs? []  Sudden numbness in arms or legs? []  Sudden onset of difficult speaking or slurred  speech? []  Temporary loss of vision in one eye? []  Problems with dizziness?  Gastrointestinal:  []  Blood in stool? []  Vomited blood?  Genitourinary:  []  Burning when urinating? []  Blood in urine?  Psychiatric:  []  Major depression  Hematologic:  []  Bleeding problems? []  Problems with blood clotting?  Dermatologic:  []  Rashes or ulcers?  Constitutional:  []  Fever or chills?  Ear/Nose/Throat:  []  Change in hearing? []  Nose bleeds? []  Sore throat?  Musculoskeletal:  []  Back pain? []  Joint pain? []  Muscle pain?   Physical Examination     Vitals:   01/20/20 1322  BP: 121/78  Pulse: 70  Resp: 20  Temp: 97.8 F (36.6 C)  SpO2: 98%   General:  WDWN in NAD; vital signs documented above Gait: Relates with cane HENT: WNL, normocephalic Pulmonary: normal non-labored breathing , without Rales, rhonchi,  wheezing Cardiac: regular HR, without  Murmurs without carotid bruits Abdomen: soft, NT, no masses Skin: without rashes Vascular Exam/Pulses:  Right Left  Radial 2+ (normal) 2+ (normal)  Ulnar  not evaluated  not evaluated  Femoral 1+ (weak) 1+ (weak)  Popliteal  not palpable  not palpable  DP  not palpable  not palpable  PT 2+ (normal) 1+ (weak)   Extremities: with varicose veins, with reticular veins, with edema, without stasis pigmentation, without lipodermatosclerosis, without ulcers Musculoskeletal: no muscle wasting or atrophy  Neurologic: A&O X 3; left facial drooping Psychiatric:  The pt has Normal affect.  Non-invasive Vascular Imaging   BLE Venous Insufficiency Duplex (01/20/2020):   RLE:   no DVT and SVT,   no GSV reflux ,  GSV diameter   no SSV reflux ,  + deep venous reflux   LLE:  no DVT and SVT,   + GSV reflux mid calf,   GSV diameter   no SSV reflux ,  no deep venous reflux   Medical Decision Making   ABDULRAHEEM PINEO is a 64 y.o. male who presents with: BLE chronic venous insufficiency,    Based on the patient's  history and examination, I recommend: 15 to 20 mmHg knee-high compression stockings.  Instructed to don each morning before getting out of bed.  I recommended using these daily and in particular when he may be sitting or riding in a car for extended periods of time.  Due to his left sided weakness, I explained to he and his friend he will need assistance donning and doffing the stockings.  No evidence of DVT.  No evidence of significant venous reflux.   Thank you for allowing [place] to participate in this patient's care.   Barbie Banner, PA-C [facility] Office: [phone]  01/20/2020, 12:06 PM  Clinic MD: Trula Slade

## 2020-01-21 DIAGNOSIS — D333 Benign neoplasm of cranial nerves: Secondary | ICD-10-CM | POA: Diagnosis not present

## 2020-01-21 DIAGNOSIS — Z930 Tracheostomy status: Secondary | ICD-10-CM | POA: Diagnosis not present

## 2020-01-21 DIAGNOSIS — R1312 Dysphagia, oropharyngeal phase: Secondary | ICD-10-CM | POA: Diagnosis not present

## 2020-01-22 ENCOUNTER — Other Ambulatory Visit (HOSPITAL_COMMUNITY): Payer: Self-pay

## 2020-01-22 DIAGNOSIS — D333 Benign neoplasm of cranial nerves: Secondary | ICD-10-CM | POA: Diagnosis not present

## 2020-01-22 DIAGNOSIS — R1312 Dysphagia, oropharyngeal phase: Secondary | ICD-10-CM | POA: Diagnosis not present

## 2020-01-22 DIAGNOSIS — Z930 Tracheostomy status: Secondary | ICD-10-CM | POA: Diagnosis not present

## 2020-01-22 DIAGNOSIS — R1310 Dysphagia, unspecified: Secondary | ICD-10-CM

## 2020-01-23 DIAGNOSIS — D333 Benign neoplasm of cranial nerves: Secondary | ICD-10-CM | POA: Diagnosis not present

## 2020-01-23 DIAGNOSIS — R1312 Dysphagia, oropharyngeal phase: Secondary | ICD-10-CM | POA: Diagnosis not present

## 2020-01-23 DIAGNOSIS — Z930 Tracheostomy status: Secondary | ICD-10-CM | POA: Diagnosis not present

## 2020-01-24 DIAGNOSIS — R1312 Dysphagia, oropharyngeal phase: Secondary | ICD-10-CM | POA: Diagnosis not present

## 2020-01-24 DIAGNOSIS — D333 Benign neoplasm of cranial nerves: Secondary | ICD-10-CM | POA: Diagnosis not present

## 2020-01-24 DIAGNOSIS — Z930 Tracheostomy status: Secondary | ICD-10-CM | POA: Diagnosis not present

## 2020-01-27 DIAGNOSIS — D333 Benign neoplasm of cranial nerves: Secondary | ICD-10-CM | POA: Diagnosis not present

## 2020-01-27 DIAGNOSIS — R1312 Dysphagia, oropharyngeal phase: Secondary | ICD-10-CM | POA: Diagnosis not present

## 2020-01-27 DIAGNOSIS — Z930 Tracheostomy status: Secondary | ICD-10-CM | POA: Diagnosis not present

## 2020-01-28 ENCOUNTER — Ambulatory Visit (HOSPITAL_COMMUNITY)
Admission: RE | Admit: 2020-01-28 | Discharge: 2020-01-28 | Disposition: A | Discharge status: Home / Self Care | Payer: Medicare Other | Source: Ambulatory Visit | Attending: Internal Medicine | Admitting: Internal Medicine

## 2020-01-28 ENCOUNTER — Other Ambulatory Visit: Payer: Self-pay

## 2020-01-28 DIAGNOSIS — R1310 Dysphagia, unspecified: Secondary | ICD-10-CM | POA: Diagnosis not present

## 2020-01-28 DIAGNOSIS — Z0389 Encounter for observation for other suspected diseases and conditions ruled out: Secondary | ICD-10-CM | POA: Diagnosis not present

## 2020-01-28 NOTE — Progress Notes (Signed)
Modified Barium Swallow Progress Note  Patient Details  Name: Casey Richard MRN: [id number] Date of Birth: November 24, 1955  Today's Date: 01/28/2020  Modified Barium Swallow completed.  Full report located under Chart Review in the Imaging Section.  Brief recommendations include the following:  Clinical Impression  Pt demonstrates additional improvements in oropharyngeal swallow compared to most recent MBS in June 2021. There is still significantly reduced movement of the base of tongue, hyolaryngeal complex, and epiglottis with limited pharyngeal squeeze and UES opening. Functional improvements include increased pharyngeal clearance, with mild residue in the pyriform sinuses with thin liquids and more moderate residue with purees and soft solids. As he performs additional hard swallows he can reduce pharyngeal residuals. Postural strategies alone are not as effective, but thin liquid washes also help reduce residue from solid boluses. Most noticeable is his improved airway protection, with no penetration or aspiration occurring throughout the study. Pt did have intermittent coughing, particularly once he was alerted to the presence of residue, but it was not related to any airway invasion. Recommend advancing diet up to Dys 2 (chopped) solids with thin liquids, using multiple swallows per bolus and alternating between bites and sips. Recommend ongoing SLP f/u as he continues to demonstrate functional gains in therapy. Education was provided to pt and his POA, who was present for the study, with written information also provided.    Swallow Evaluation Recommendations       SLP Diet Recommendations: Dysphagia 2 (Fine chop) solids;Thin liquid   Liquid Administration via: Cup;Straw   Medication Administration: Crushed with puree   Supervision: Patient able to self feed;Full supervision/cueing for compensatory strategies (supervision initially)   Compensations: Slow rate;Clear throat  intermittently;Multiple dry swallows after each bite/sip;Follow solids with liquid   Postural Changes: Seated upright at 90 degrees;Remain semi-upright after after feeds/meals (Comment)   Oral Care Recommendations: Oral care BID   Other Recommendations: Place PMSV during PO intake    Osie Bond., M.A. [place] Pager [phone] Office [phone]  01/28/2020,1:16 PM

## 2020-01-29 DIAGNOSIS — R1312 Dysphagia, oropharyngeal phase: Secondary | ICD-10-CM | POA: Diagnosis not present

## 2020-01-29 DIAGNOSIS — Z930 Tracheostomy status: Secondary | ICD-10-CM | POA: Diagnosis not present

## 2020-01-29 DIAGNOSIS — D333 Benign neoplasm of cranial nerves: Secondary | ICD-10-CM | POA: Diagnosis not present

## 2020-01-30 DIAGNOSIS — Z930 Tracheostomy status: Secondary | ICD-10-CM | POA: Diagnosis not present

## 2020-01-30 DIAGNOSIS — R1312 Dysphagia, oropharyngeal phase: Secondary | ICD-10-CM | POA: Diagnosis not present

## 2020-01-30 DIAGNOSIS — D333 Benign neoplasm of cranial nerves: Secondary | ICD-10-CM | POA: Diagnosis not present

## 2020-01-31 DIAGNOSIS — Z930 Tracheostomy status: Secondary | ICD-10-CM | POA: Diagnosis not present

## 2020-01-31 DIAGNOSIS — D333 Benign neoplasm of cranial nerves: Secondary | ICD-10-CM | POA: Diagnosis not present

## 2020-01-31 DIAGNOSIS — R1312 Dysphagia, oropharyngeal phase: Secondary | ICD-10-CM | POA: Diagnosis not present

## 2020-02-03 DIAGNOSIS — D333 Benign neoplasm of cranial nerves: Secondary | ICD-10-CM | POA: Diagnosis not present

## 2020-02-03 DIAGNOSIS — Z930 Tracheostomy status: Secondary | ICD-10-CM | POA: Diagnosis not present

## 2020-02-03 DIAGNOSIS — R1312 Dysphagia, oropharyngeal phase: Secondary | ICD-10-CM | POA: Diagnosis not present

## 2020-02-04 ENCOUNTER — Telehealth: Payer: Self-pay

## 2020-02-04 DIAGNOSIS — D333 Benign neoplasm of cranial nerves: Secondary | ICD-10-CM | POA: Diagnosis not present

## 2020-02-04 DIAGNOSIS — Z930 Tracheostomy status: Secondary | ICD-10-CM | POA: Diagnosis not present

## 2020-02-04 DIAGNOSIS — R1312 Dysphagia, oropharyngeal phase: Secondary | ICD-10-CM | POA: Diagnosis not present

## 2020-02-04 NOTE — Telephone Encounter (Signed)
Unscheduled manual transmission received-  Device at RRT 01/22/20, currently VVI 65 with AV dissynchrony.    Attempted to reach pt to determine is symptomatic and also discuss need for in clinic appt.  Last OV was 02/14/18.  Pt number answered by a gentleman who identified himself as Bill, pt POA.  No DPR on file for him.  He provided # to reach pt as [phone] however that number is disconnected.  Called Bill back and requested he have the patient call us back, provided device clinic # and hours.

## 2020-02-05 DIAGNOSIS — R1312 Dysphagia, oropharyngeal phase: Secondary | ICD-10-CM | POA: Diagnosis not present

## 2020-02-05 DIAGNOSIS — D333 Benign neoplasm of cranial nerves: Secondary | ICD-10-CM | POA: Diagnosis not present

## 2020-02-05 DIAGNOSIS — Z930 Tracheostomy status: Secondary | ICD-10-CM | POA: Diagnosis not present

## 2020-02-06 NOTE — Telephone Encounter (Signed)
The pt was returning Amy phone call. He can be reached at [phone].

## 2020-02-06 NOTE — Telephone Encounter (Signed)
Spoke with pt to advise of ERI status. He reports increased BLE edema, but denies any increased SOB or chest congestion. Reports he is currently residing at [facility] for rehab. PPM is able to be reprogrammed (Revo). Scheduled with Genene Churn, NP, on 02/14/20 to discuss gen change and to reprogram device to previous settings (DDD 70). Pt to call back on 02/07/20 to confirm the appointment and office address. Routed to Dr. Sallyanne Kuster as Jul[facility]ier.

## 2020-02-07 DIAGNOSIS — Z930 Tracheostomy status: Secondary | ICD-10-CM | POA: Diagnosis not present

## 2020-02-07 DIAGNOSIS — D333 Benign neoplasm of cranial nerves: Secondary | ICD-10-CM | POA: Diagnosis not present

## 2020-02-07 DIAGNOSIS — R1312 Dysphagia, oropharyngeal phase: Secondary | ICD-10-CM | POA: Diagnosis not present

## 2020-02-10 DIAGNOSIS — Z930 Tracheostomy status: Secondary | ICD-10-CM | POA: Diagnosis not present

## 2020-02-10 DIAGNOSIS — R1312 Dysphagia, oropharyngeal phase: Secondary | ICD-10-CM | POA: Diagnosis not present

## 2020-02-10 DIAGNOSIS — D333 Benign neoplasm of cranial nerves: Secondary | ICD-10-CM | POA: Diagnosis not present

## 2020-02-10 NOTE — Telephone Encounter (Signed)
Thank you, that is great.

## 2020-02-11 DIAGNOSIS — R1312 Dysphagia, oropharyngeal phase: Secondary | ICD-10-CM | POA: Diagnosis not present

## 2020-02-11 DIAGNOSIS — D333 Benign neoplasm of cranial nerves: Secondary | ICD-10-CM | POA: Diagnosis not present

## 2020-02-11 DIAGNOSIS — Z930 Tracheostomy status: Secondary | ICD-10-CM | POA: Diagnosis not present

## 2020-02-13 ENCOUNTER — Telehealth: Payer: Self-pay

## 2020-02-13 DIAGNOSIS — D3610 Benign neoplasm of peripheral nerves and autonomic nervous system, unspecified: Secondary | ICD-10-CM | POA: Diagnosis not present

## 2020-02-13 DIAGNOSIS — Z930 Tracheostomy status: Secondary | ICD-10-CM | POA: Diagnosis not present

## 2020-02-13 DIAGNOSIS — R1312 Dysphagia, oropharyngeal phase: Secondary | ICD-10-CM | POA: Diagnosis not present

## 2020-02-13 DIAGNOSIS — D333 Benign neoplasm of cranial nerves: Secondary | ICD-10-CM | POA: Diagnosis not present

## 2020-02-13 DIAGNOSIS — J9621 Acute and chronic respiratory failure with hypoxia: Secondary | ICD-10-CM | POA: Diagnosis not present

## 2020-02-13 DIAGNOSIS — G529 Cranial nerve disorder, unspecified: Secondary | ICD-10-CM | POA: Diagnosis not present

## 2020-02-13 NOTE — Telephone Encounter (Signed)
I called the pt back after he left 3 messages. I confirmed that he has an appointment with Amber at 10:20 am. The pt verbalized understanding and thanked me for calling him back.

## 2020-02-13 NOTE — Progress Notes (Signed)
Electrophysiology Office Note Date: 02/14/2020  ID:  Casey Richard, DOB 06-16-1956, MRN [id number]  PCP: Benito Mccreedy, MD Primary Cardiologist: [place]   CC: Pacemaker follow-up  Casey Richard is a 64 y.o. male seen today for Dr C.  He presents today for routine electrophysiology followup.  Since last being seen in our clinic, the patient reports doing very well.  He denies chest pain, palpitations, dyspnea, PND, orthopnea, nausea, vomiting, dizziness, syncope, edema, weight gain, or early satiety.  Device History: MDT dual chamber PPM implanted 2013 for CHB   Past Medical History:  Diagnosis Date  . Cardiac arrest ([place])   . CHB (complete heart block) ([place])   . 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 Dysrhythmia 1/13   bradycardia, VF, cardiac arrest/complete heart block with pacer inserted/LOV Dr Sallyanne Kuster 07/21/11 with interrogation and anesthesia guideline order on chart. Chest x ray 1/13 EPIC,, TEE and [facility]  1/13 EPIC  . Memory loss, short term    from fall with heart block 1/13  . Nonsustained ventricular tachycardia ([place]) 02/07/2014  . Presence of permanent cardiac pacemaker 07/15/2011   Medtronic Revo  . Seasonal allergies   . Shortness of breath    Past Surgical History:  Procedure Laterality Date  . CARDIAC CATHETERIZATION  07/09/2011   Normal coronaries  . CRANIOTOMY Right 10/07/2019   Procedure: RIGHT CRANIOTOMY TUMOR EXCISION;  Surgeon: Judith Part, MD;  Location: [place];  Service: Neurosurgery;  Laterality: Right;  . CRANIOTOMY Right 10/10/2019   Procedure: CRANIOTOMY FOR EVACUATION OF HEMATOMA;  Surgeon: Judith Part, MD;  Location: [place];  Service: Neurosurgery;  Laterality: Right;  CRANIOTOMY TUMOR EXCISION  . CYSTOSCOPY WITH URETHRAL DILATATION  08/19/2011   Procedure: CYSTOSCOPY WITH URETHRAL DILATATION;  Surgeon: Dutch Gray, MD;  Location[facility];  Service: Urology;  Laterality: N/A;  Balloon  Dilation of Urethral Stricture, retro-urethrogram, Suprapubic change   . ESOPHAGOGASTRODUODENOSCOPY N/A 10/18/2019   Procedure: ESOPHAGOGASTRODUODENOSCOPY (EGD);  Surgeon: Jesusita Oka, MD;  Location: [facility] ENDOSCOPY;  Service: Endoscopy;  Laterality: N/A;  . LEFT HEART CATHETERIZATION WITH CORONARY ANGIOGRAM N/A 07/09/2011   Procedure: LEFT HEART CATHETERIZATION WITH CORONARY ANGIOGRAM;  Surgeon: Jettie Booze, MD;  Location: [facility] CATH LAB;  Service: Cardiovascular;  Laterality: N/A;  . PACEMAKER INSERTION  07/15/11   Medtronic Revo  . PEG PLACEMENT N/A 10/18/2019   Procedure: PERCUTANEOUS ENDOSCOPIC GASTROSTOMY (PEG) PLACEMENT;  Surgeon: Jesusita Oka, MD;  Location: [place] ENDOSCOPY;  Service: Endoscopy;  Laterality: N/A;  . PERMANENT PACEMAKER INSERTION Left 07/15/2011   Procedure: PERMANENT PACEMAKER INSERTION;  Surgeon: Sanda Klein, MD;  Location: [place] CATH LAB;  Service: Cardiovascular;  Laterality: Left;  . TEMPORARY PACEMAKER INSERTION Right 07/09/2011   Procedure: TEMPORARY PACEMAKER INSERTION;  Surgeon: Jettie Booze, MD;  Location: [facility] CATH LAB;  Service: Cardiovascular;  Laterality: Right;    Current Outpatient Medications  Medication Sig Dispense Refill  . acetaminophen (TYLENOL) 500 MG tablet Take 1,000 mg by mouth every 6 (six) hours as needed. Pain     . Amino Acids-Protein Hydrolys (FEEDING SUPPLEMENT, PRO-STAT SUGAR FREE 64,) LIQD Place 30 mLs into feeding tube 2 (two) times daily. 887 mL 0  . amLODipine (NORVASC) 10 MG tablet Place 1 tablet (10 mg total) into feeding tube daily.    Marland Kitchen artificial tears (LACRILUBE) OINT ophthalmic ointment Place into the right eye every 4 (four) hours.    . bethanechol (URECHOLINE) 10 MG  tablet Place 1 tablet (10 mg total) into feeding tube 3 (three) times daily.    . Cholecalciferol (HM VITAMIN D3) 4000 units CAPS Take 1 capsule by mouth daily.    . cyanocobalamin 2000 MCG tablet Take 2,000 mcg by mouth daily.    Marland Kitchen docusate (COLACE) 50  MG/5ML liquid Place 10 mLs (100 mg total) into feeding tube daily. 100 mL 0  . erythromycin ophthalmic ointment SMARTSIG:In Eye(s)    . furosemide (LASIX) 20 MG tablet Take 20 mg by mouth daily.    Marland Kitchen guaiFENesin (ROBITUSSIN) 100 MG/5ML liquid Take 200 mg by mouth 3 (three) times daily as needed for cough. Taking 30 ml as needed for chest congestion    . heparin 5000 UNIT/ML injection Inject 1 mL (5,000 Units total) into the skin every 8 (eight) hours. 1 mL   . ipratropium-albuterol (DUONEB) 0.5-2.5 (3) MG/3ML SOLN Take 3 mLs by nebulization every 6 (six) hours as needed. 360 mL   . lisinopril (ZESTRIL) 5 MG tablet Place 1 tablet (5 mg total) into feeding tube daily.    . Multiple Vitamin (MULITIVITAMIN WITH MINERALS) TABS Take 1 tablet by mouth daily.     . Nutritional Supplements (ISOSOURCE 1.5 CAL) LIQD 1,000 mLs by PEG Tube route daily.  0  . pantoprazole sodium (PROTONIX) 40 mg/20 mL PACK Place 20 mLs (40 mg total) into feeding tube daily. 30 mL   . QUEtiapine (SEROQUEL) 25 MG tablet Place 1 tablet (25 mg total) into feeding tube at bedtime.    . Water For Irrigation, Sterile (FREE WATER) SOLN Place 200 mLs into feeding tube every 4 (four) hours.     No current facility-administered medications for this visit.    Allergies:   Patient has no known allergies.   Social History: Social History   Socioeconomic History  . Marital status: Widowed    Spouse name: Not on file  . Number of children: 4  . Years of education: Not on file  . Highest education level: Not on file  Occupational History  . Not on file  Tobacco Use  . Smoking status: Former Smoker    Types: Cigarettes    Quit date: 06/20/1989    Years since quitting: 30.6  . Smokeless tobacco: Never Used  Vaping Use  . Vaping Use: Never used  Substance and Sexual Activity  . Alcohol use: No  . Drug use: No  . Sexual activity: Not on file  Other Topics Concern  . Not on file  Social History Narrative  . Not on file    Social Determinants of Health   Financial Resource Strain:   . Difficulty of Paying Living Expenses: Not on file  Food Insecurity:   . Worried About Charity fundraiser in the Last Year: Not on file  . Ran Out of Food in the Last Year: Not on file  Transportation Needs:   . Lack of Transportation (Medical): Not on file  . Lack of Transportation (Non-Medical): Not on file  Physical Activity:   . Days of Exercise per Week: Not on file  . Minutes of Exercise per Session: Not on file  Stress:   . Feeling of Stress : Not on file  Social Connections:   . Frequency of Communication with Friends and Family: Not on file  . Frequency of Social Gatherings with Friends and Family: Not on file  . Attends Religious Services: Not on file  . Active Member of Clubs or Organizations: Not on file  .  Attends Archivist Meetings: Not on file  . Marital Status: Not on file  Intimate Partner Violence:   . Fear of Current or Ex-Partner: Not on file  . Emotionally Abused: Not on file  . Physically Abused: Not on file  . Sexually Abused: Not on file    Family History: Family History  Problem Relation Age of Onset  . Coronary artery disease Mother        AICD  . COPD Father   . Coronary artery disease Brother      Review of Systems: All other systems reviewed and are otherwise negative except as noted above.   Physical Exam: VS:  BP 112/74   Pulse 66   Ht 5\' 11"  (1.803 m)   Wt 159 lb 3.2 oz (72.2 kg)   SpO2 98%   BMI 22.20 kg/m  , BMI Body mass index is 22.2 kg/m.  GEN- The patient is well appearing, alert and oriented x 3 today.   HEENT: normocephalic, atraumatic; sclera clear, conjunctiva pink; hearing intact; +trach Lungs- Clear to ausculation bilaterally, normal work of breathing.  Heart- Regular rate and rhythm  GI- soft, non-tender, non-distended, bowel sounds present  Extremities- no clubbing, cyanosis, or edema  MS- no significant deformity or atrophy Skin- warm  and dry, no rash or lesion; PPM pocket well healed Psych- euthymic mood, full affect Neuro- strength and sensation are intact  PPM Interrogation- reviewed in detail today,  See PACEART report  EKG:  EKG is not ordered today.   Recent Labs: 10/01/2019: TSH 0.661 10/17/2019: Magnesium 2.1 11/06/2019: Hemoglobin 9.5; Platelets 265 11/08/2019: ALT 34; BUN 19; Creatinine, Ser 0.67; Potassium 4.4; Sodium 138   Wt Readings from Last 3 Encounters:  02/14/20 159 lb 3.2 oz (72.2 kg)  01/20/20 144 lb 4.8 oz (65.5 kg)  11/15/19 138 lb 0.1 oz (62.6 kg)     Other studies Reviewed: Additional studies/ records that were reviewed today include: Dr Lurline Del notes    Assessment and Plan:  1.  Complete heart block Pacemaker at [facility] Device reprogrammed today to DDD He is symptomatically stable with no clinical signs of RV pacing induced cardiomyopathy.  Risks, benefits to gen change reviewed with patient today who wishes to proceed. Will schedule with Dr C at next available time. He is device dependent today.     Current medicines are reviewed at length with the patient today.   The patient does not have concerns regarding his medicines.  The following changes were made today:  none  Labs/ tests ordered today include: pre-procedure labs No orders of the defined types were placed in this encounter.    Disposition:   routine post procedure follow up     Signed, Chanetta Marshall, NP 02/14/2020 10:50 AM  [place] [address] [place] [address] [phone] (office) [phone] (fax)

## 2020-02-14 ENCOUNTER — Telehealth: Payer: Self-pay | Admitting: *Deleted

## 2020-02-14 ENCOUNTER — Ambulatory Visit: Payer: Medicare Other | Admitting: Nurse Practitioner

## 2020-02-14 ENCOUNTER — Other Ambulatory Visit: Payer: Self-pay

## 2020-02-14 ENCOUNTER — Encounter: Payer: Self-pay | Admitting: Nurse Practitioner

## 2020-02-14 VITALS — BP 112/74 | HR 66 | Ht 71.0 in | Wt 159.2 lb

## 2020-02-14 DIAGNOSIS — I442 Atrioventricular block, complete: Secondary | ICD-10-CM

## 2020-02-14 LAB — CUP PACEART INCLINIC DEVICE CHECK
Battery Voltage: 2.81 V
Brady Statistic AP VP Percent: 9.2 %
Brady Statistic AP VS Percent: 0 %
Brady Statistic AS VP Percent: 90.51 %
Brady Statistic AS VS Percent: 0.29 %
Brady Statistic RA Percent Paced: 8.87 %
Brady Statistic RV Percent Paced: 99.48 %
Date Time Interrogation Session: 20210827105145
Implantable Lead Implant Date: 20130125
Implantable Lead Implant Date: 20130125
Implantable Lead Location: 753859
Implantable Lead Location: 753860
Implantable Lead Model: 5076
Implantable Pulse Generator Implant Date: 20130125
Lead Channel Impedance Value: 384 Ohm
Lead Channel Impedance Value: 432 Ohm
Lead Channel Sensing Intrinsic Amplitude: 3.799 mV
Lead Channel Setting Pacing Amplitude: 2 V
Lead Channel Setting Pacing Amplitude: 2.5 V
Lead Channel Setting Pacing Pulse Width: 0.4 ms
Lead Channel Setting Sensing Sensitivity: 2.1 mV

## 2020-02-14 NOTE — Patient Instructions (Signed)
Medication Instructions:  *If you need a refill on your cardiac medications before your next appointment, please call your pharmacy*  Follow-Up: At [facility], you and your health needs are our priority.  As part of our continuing mission to provide you with exceptional heart care, we have created designated Provider Care Teams.  These Care Teams include your primary Cardiologist (physician) and Advanced Practice Providers (APPs -  Physician Assistants and Nurse Practitioners) who all work together to provide you with the care you need, when you need it.  We recommend signing up for the patient portal called "MyChart".  Sign up information is provided on this After Visit Summary.  MyChart is used to connect with patients for Virtual Visits (Telemedicine).  Patients are able to view lab/test results, encounter notes, upcoming appointments, etc.  Non-urgent messages can be sent to your provider as well.   To learn more about what you can do with MyChart, go to [email or web address].    Your next appointment:    You will be contact by Lattie Haw, Dr. Victorino December nurse in regards to scheduling your generator change  (battery change) for your device as well as follow up. -- You will need to have a Covid Pre-Screening test 72 hours prior to you procedure --   The format for your next appointment:   In Person with Dr. Sallyanne Kuster

## 2020-02-14 NOTE — Telephone Encounter (Signed)
Spoke with the patient about his generator change procedure. The first available date is 03/02/2020.  He stated that this would be fine. Letter of instructions will be mailed. He has asked to be called next week with instructions.

## 2020-02-17 ENCOUNTER — Emergency Department (HOSPITAL_COMMUNITY): Payer: Medicare Other

## 2020-02-17 ENCOUNTER — Encounter (HOSPITAL_COMMUNITY): Payer: Self-pay

## 2020-02-17 ENCOUNTER — Inpatient Hospital Stay (HOSPITAL_COMMUNITY)
Admission: EM | Admit: 2020-02-17 | Discharge: 2020-02-27 | DRG: 522 | Disposition: A | Discharge status: Skilled Nursing Facility | Payer: Medicare Other | Attending: Internal Medicine | Admitting: Internal Medicine

## 2020-02-17 ENCOUNTER — Other Ambulatory Visit: Payer: Self-pay

## 2020-02-17 DIAGNOSIS — Z825 Family history of asthma and other chronic lower respiratory diseases: Secondary | ICD-10-CM

## 2020-02-17 DIAGNOSIS — K219 Gastro-esophageal reflux disease without esophagitis: Secondary | ICD-10-CM | POA: Diagnosis not present

## 2020-02-17 DIAGNOSIS — R1310 Dysphagia, unspecified: Secondary | ICD-10-CM | POA: Diagnosis present

## 2020-02-17 DIAGNOSIS — R29810 Facial weakness: Secondary | ICD-10-CM | POA: Diagnosis not present

## 2020-02-17 DIAGNOSIS — J302 Other seasonal allergic rhinitis: Secondary | ICD-10-CM | POA: Diagnosis present

## 2020-02-17 DIAGNOSIS — Z79899 Other long term (current) drug therapy: Secondary | ICD-10-CM

## 2020-02-17 DIAGNOSIS — Z9359 Other cystostomy status: Secondary | ICD-10-CM

## 2020-02-17 DIAGNOSIS — S72001A Fracture of unspecified part of neck of right femur, initial encounter for closed fracture: Secondary | ICD-10-CM | POA: Diagnosis present

## 2020-02-17 DIAGNOSIS — Z950 Presence of cardiac pacemaker: Secondary | ICD-10-CM

## 2020-02-17 DIAGNOSIS — I442 Atrioventricular block, complete: Secondary | ICD-10-CM | POA: Diagnosis not present

## 2020-02-17 DIAGNOSIS — I10 Essential (primary) hypertension: Secondary | ICD-10-CM | POA: Diagnosis not present

## 2020-02-17 DIAGNOSIS — Z751 Person awaiting admission to adequate facility elsewhere: Secondary | ICD-10-CM

## 2020-02-17 DIAGNOSIS — Z45010 Encounter for checking and testing of cardiac pacemaker pulse generator [battery]: Secondary | ICD-10-CM | POA: Diagnosis not present

## 2020-02-17 DIAGNOSIS — W010XXA Fall on same level from slipping, tripping and stumbling without subsequent striking against object, initial encounter: Secondary | ICD-10-CM | POA: Diagnosis present

## 2020-02-17 DIAGNOSIS — Z87891 Personal history of nicotine dependence: Secondary | ICD-10-CM | POA: Diagnosis not present

## 2020-02-17 DIAGNOSIS — Z01810 Encounter for preprocedural cardiovascular examination: Secondary | ICD-10-CM | POA: Diagnosis not present

## 2020-02-17 DIAGNOSIS — R413 Other amnesia: Secondary | ICD-10-CM | POA: Diagnosis not present

## 2020-02-17 DIAGNOSIS — N368 Other specified disorders of urethra: Secondary | ICD-10-CM | POA: Diagnosis not present

## 2020-02-17 DIAGNOSIS — Z8781 Personal history of (healed) traumatic fracture: Secondary | ICD-10-CM

## 2020-02-17 DIAGNOSIS — Z930 Tracheostomy status: Secondary | ICD-10-CM | POA: Diagnosis not present

## 2020-02-17 DIAGNOSIS — N9989 Other postprocedural complications and disorders of genitourinary system: Secondary | ICD-10-CM | POA: Diagnosis present

## 2020-02-17 DIAGNOSIS — R1312 Dysphagia, oropharyngeal phase: Secondary | ICD-10-CM | POA: Diagnosis not present

## 2020-02-17 DIAGNOSIS — M25572 Pain in left ankle and joints of left foot: Secondary | ICD-10-CM | POA: Diagnosis not present

## 2020-02-17 DIAGNOSIS — G510 Bell's palsy: Secondary | ICD-10-CM | POA: Diagnosis present

## 2020-02-17 DIAGNOSIS — Z931 Gastrostomy status: Secondary | ICD-10-CM | POA: Diagnosis not present

## 2020-02-17 DIAGNOSIS — R339 Retention of urine, unspecified: Secondary | ICD-10-CM | POA: Diagnosis not present

## 2020-02-17 DIAGNOSIS — K5900 Constipation, unspecified: Secondary | ICD-10-CM | POA: Diagnosis present

## 2020-02-17 DIAGNOSIS — M25551 Pain in right hip: Secondary | ICD-10-CM | POA: Diagnosis not present

## 2020-02-17 DIAGNOSIS — S72041A Displaced fracture of base of neck of right femur, initial encounter for closed fracture: Secondary | ICD-10-CM | POA: Diagnosis not present

## 2020-02-17 DIAGNOSIS — R6889 Other general symptoms and signs: Secondary | ICD-10-CM | POA: Diagnosis not present

## 2020-02-17 DIAGNOSIS — D62 Acute posthemorrhagic anemia: Secondary | ICD-10-CM | POA: Diagnosis not present

## 2020-02-17 DIAGNOSIS — R4702 Dysphasia: Secondary | ICD-10-CM | POA: Diagnosis present

## 2020-02-17 DIAGNOSIS — Z8249 Family history of ischemic heart disease and other diseases of the circulatory system: Secondary | ICD-10-CM

## 2020-02-17 DIAGNOSIS — Z9889 Other specified postprocedural states: Secondary | ICD-10-CM

## 2020-02-17 DIAGNOSIS — W19XXXA Unspecified fall, initial encounter: Secondary | ICD-10-CM | POA: Diagnosis not present

## 2020-02-17 DIAGNOSIS — D333 Benign neoplasm of cranial nerves: Secondary | ICD-10-CM | POA: Diagnosis not present

## 2020-02-17 DIAGNOSIS — Z20822 Contact with and (suspected) exposure to covid-19: Secondary | ICD-10-CM | POA: Diagnosis present

## 2020-02-17 DIAGNOSIS — Z743 Need for continuous supervision: Secondary | ICD-10-CM | POA: Diagnosis not present

## 2020-02-17 HISTORY — DX: Benign neoplasm of peripheral nerves and autonomic nervous system, unspecified: D36.10

## 2020-02-17 HISTORY — DX: Crossing vessel and stricture of ureter without hydronephrosis: N13.5

## 2020-02-17 LAB — COMPREHENSIVE METABOLIC PANEL
ALT: 30 U/L (ref 0–44)
AST: 20 U/L (ref 15–41)
Albumin: 4.2 g/dL (ref 3.5–5.0)
Alkaline Phosphatase: 77 U/L (ref 38–126)
Anion gap: 10 (ref 5–15)
BUN: 22 mg/dL (ref 8–23)
CO2: 27 mmol/L (ref 22–32)
Calcium: 8.9 mg/dL (ref 8.9–10.3)
Chloride: 102 mmol/L (ref 98–111)
Creatinine, Ser: 0.74 mg/dL (ref 0.61–1.24)
GFR calc Af Amer: >60 mL/min (ref >60)
GFR calc non Af Amer: >60 mL/min (ref >60)
Glucose, Bld: 121 mg/dL — ABNORMAL HIGH (ref 70–99)
Potassium: 3.9 mmol/L (ref 3.5–5.1)
Sodium: 139 mmol/L (ref 135–145)
Total Bilirubin: 0.7 mg/dL (ref 0.3–1.2)
Total Protein: 7.7 g/dL (ref 6.5–8.1)

## 2020-02-17 LAB — CBC
HCT: 38.5 % — ABNORMAL LOW (ref 39.0–52.0)
Hemoglobin: 12.2 g/dL — ABNORMAL LOW (ref 13.0–17.0)
MCH: 28 pg (ref 26.0–34.0)
MCHC: 31.7 g/dL (ref 30.0–36.0)
MCV: 88.3 fL (ref 80.0–100.0)
Platelets: 182 10*3/uL (ref 150–400)
RBC: 4.36 MIL/uL (ref 4.22–5.81)
RDW: 14.4 % (ref 11.5–15.5)
WBC: 10.8 10*3/uL — ABNORMAL HIGH (ref 4.0–10.5)
nRBC: 0 % (ref 0.0–0.2)

## 2020-02-17 LAB — SARS CORONAVIRUS 2 BY RT PCR (HOSPITAL ORDER, PERFORMED IN ~~LOC~~ HOSPITAL LAB): SARS Coronavirus 2: NEGATIVE

## 2020-02-17 MED ORDER — OSMOLITE 1.2 CAL PO LIQD
237.0000 mL | Freq: Once | ORAL | Status: AC
  Administered 2020-02-18: 237 mL
  Filled 2020-02-17: qty 237

## 2020-02-17 MED ORDER — TRANEXAMIC ACID-NACL 1000-0.7 MG/100ML-% IV SOLN
1000.0000 mg | INTRAVENOUS | Status: AC
  Administered 2020-02-18: 1000 mg via INTRAVENOUS
  Filled 2020-02-17 (×2): qty 100

## 2020-02-17 MED ORDER — HYDROMORPHONE HCL 1 MG/ML IJ SOLN
1.0000 mg | Freq: Once | INTRAMUSCULAR | Status: AC
  Administered 2020-02-17: 1 mg via INTRAVENOUS
  Filled 2020-02-17: qty 1

## 2020-02-17 MED ORDER — OSMOLITE 1.2 CAL PO LIQD: 237.0000 mL | Freq: Four times a day (QID) | ORAL | Status: DC

## 2020-02-17 MED ORDER — POVIDONE-IODINE 10 % EX SWAB: 2.0000 "application " | Freq: Once | CUTANEOUS | Status: DC

## 2020-02-17 MED ORDER — CEFAZOLIN SODIUM-DEXTROSE 2-4 GM/100ML-% IV SOLN
2.0000 g | INTRAVENOUS | Status: AC
  Administered 2020-02-18: 2 g via INTRAVENOUS
  Filled 2020-02-17 (×2): qty 100

## 2020-02-17 MED ORDER — CHLORHEXIDINE GLUCONATE 4 % EX LIQD: 60.0000 mL | Freq: Once | CUTANEOUS | Status: DC

## 2020-02-17 NOTE — ED Triage Notes (Signed)
Patient arrived with gcems after an unwitnessed fall about an hour ago. Reporting right hip pain, patient able to sit up on side of the bed and get up with assistance. No LOC.

## 2020-02-17 NOTE — ED Provider Notes (Signed)
[facility] Emergency Department Provider Note MRN:  [id number]  Arrival date & time: 02/17/20     Chief Complaint   Hip Pain   History of Present Illness   Casey Richard is a 64 y.o. year-old male with a history of cardiac arrest, complete heart block status post pacemaker presenting to the ED with chief complaint of hip pain.  Patient explains that earlier today he tripped over his Foley catheter and fell backwards onto his right hip.  He has had continued severe right hip pain since that time.  Unable to bend the leg or walk since then.  Denies head trauma, no loss consciousness, no neck or back pain, no chest pain or shortness of breath, no abdominal pain, no numbness or weakness.  Pain is worse with motion or palpation.  Review of Systems  A complete 10 system review of systems was obtained and all systems are negative except as noted in the HPI and PMH.   Patient's Health History    Past Medical History:  Diagnosis Date  . Cardiac arrest ([place])   . CHB (complete heart block) ([place])   . 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 Dysrhythmia 1/13   bradycardia, VF, cardiac arrest/complete heart block with pacer inserted/LOV Dr Sallyanne Kuster 07/21/11 with interrogation and anesthesia guideline order on chart. Chest x ray 1/13 EPIC,, TEE and [facility]  1/13 EPIC  . Memory loss, short term    from fall with heart block 1/13  . Nonsustained ventricular tachycardia ([place]) 02/07/2014  . Presence of permanent cardiac pacemaker 07/15/2011   Medtronic Revo  . Seasonal allergies   . Shortness of breath     Past Surgical History:  Procedure Laterality Date  . CARDIAC CATHETERIZATION  07/09/2011   Normal coronaries  . CRANIOTOMY Right 10/07/2019   Procedure: RIGHT CRANIOTOMY TUMOR EXCISION;  Surgeon: Judith Part, MD;  Location: [place];  Service: Neurosurgery;  Laterality: Right;  . CRANIOTOMY Right 10/10/2019   Procedure:  CRANIOTOMY FOR EVACUATION OF HEMATOMA;  Surgeon: Judith Part, MD;  Location: [place];  Service: Neurosurgery;  Laterality: Right;  CRANIOTOMY TUMOR EXCISION  . CYSTOSCOPY WITH URETHRAL DILATATION  08/19/2011   Procedure: CYSTOSCOPY WITH URETHRAL DILATATION;  Surgeon: Dutch Gray, MD;  Location: [facility];  Service: Urology;  Laterality: N/A;  Balloon Dilation of Urethral Stricture, retro-urethrogram, Suprapubic change   . ESOPHAGOGASTRODUODENOSCOPY N/A 10/18/2019   Procedure: ESOPHAGOGASTRODUODENOSCOPY (EGD);  Surgeon: Jesusita Oka, MD;  Location[facility] ENDOSCOPY;  Service: Endoscopy;  Laterality: N/A;  . LEFT HEART CATHETERIZATION WITH CORONARY ANGIOGRAM N/A 07/09/2011   Procedure: LEFT HEART CATHETERIZATION WITH CORONARY ANGIOGRAM;  Surgeon: Jettie Booze, MD;  Location: [facility] CATH LAB;  Service: Cardiovascular;  Laterality: N/A;  . PACEMAKER INSERTION  07/15/11   Medtronic Revo  . PEG PLACEMENT N/A 10/18/2019   Procedure: PERCUTANEOUS ENDOSCOPIC GASTROSTOMY (PEG) PLACEMENT;  Surgeon: Jesusita Oka, MD;  Location: [place] ENDOSCOPY;  Service: Endoscopy;  Laterality: N/A;  . PERMANENT PACEMAKER INSERTION Left 07/15/2011   Procedure: PERMANENT PACEMAKER INSERTION;  Surgeon: Sanda Klein, MD;  Location: [place] CATH LAB;  Service: Cardiovascular;  Laterality: Left;  . TEMPORARY PACEMAKER INSERTION Right 07/09/2011   Procedure: TEMPORARY PACEMAKER INSERTION;  Surgeon: Jettie Booze, MD;  Location: [facility] CATH LAB;  Service: Cardiovascular;  Laterality: Right;    Family History  Problem Relation Age of Onset  . Coronary artery disease Mother  AICD  . COPD Father   . Coronary artery disease Brother     Social History   Socioeconomic History  . Marital status: Widowed    Spouse name: Not on file  . Number of children: 4  . Years of education: Not on file  . Highest education level: Not on file  Occupational History  . Not on file  Tobacco Use  . Smoking status: Former Smoker    Types:  Cigarettes    Quit date: 06/20/1989    Years since quitting: 30.6  . Smokeless tobacco: Never Used  Vaping Use  . Vaping Use: Never used  Substance and Sexual Activity  . Alcohol use: No  . Drug use: No  . Sexual activity: Not on file  Other Topics Concern  . Not on file  Social History Narrative  . Not on file   Social Determinants of Health   Financial Resource Strain:   . Difficulty of Paying Living Expenses: Not on file  Food Insecurity:   . Worried About Charity fundraiser in the Last Year: Not on file  . Ran Out of Food in the Last Year: Not on file  Transportation Needs:   . Lack of Transportation (Medical): Not on file  . Lack of Transportation (Non-Medical): Not on file  Physical Activity:   . Days of Exercise per Week: Not on file  . Minutes of Exercise per Session: Not on file  Stress:   . Feeling of Stress : Not on file  Social Connections:   . Frequency of Communication with Friends and Family: Not on file  . Frequency of Social Gatherings with Friends and Family: Not on file  . Attends Religious Services: Not on file  . Active Member of Clubs or Organizations: Not on file  . Attends Archivist Meetings: Not on file  . Marital Status: Not on file  Intimate Partner Violence:   . Fear of Current or Ex-Partner: Not on file  . Emotionally Abused: Not on file  . Physically Abused: Not on file  . Sexually Abused: Not on file     Physical Exam   Vitals:   02/17/20 2240 02/17/20 2245  BP: 137/87 (!) 146/86  Pulse: 84 86  Resp: 17   Temp:    SpO2: 98% 94%    CONSTITUTIONAL: Chronically  ill-appearing, NAD NEURO:  Alert and oriented x 3, right facial droop EYES:  eyes equal and reactive ENT/NECK:  no LAD, no JVD, tracheostomy in place CARDIO: Regular rate, well-perfused, normal S1 and S2 PULM:  CTAB no wheezing or rhonchi GI/GU:  normal bowel sounds, non-distended, non-tender, feeding tube in place MSK/SPINE:  No gross deformities, no edema,  decreased range of motion of the right hip due to pain, neurovascularly intact distally SKIN:  no rash, atraumatic PSYCH:  Appropriate speech and behavior  *Additional and/or pertinent findings included in MDM below  Diagnostic and Interventional Summary    EKG Interpretation  Date/Time:    Ventricular Rate:    PR Interval:    QRS Duration:   QT Interval:    QTC Calculation:   R Axis:     Text Interpretation:        Labs Reviewed  CBC - Abnormal; Notable for the following components:      Result Value   WBC 10.8 (*)    Hemoglobin 12.2 (*)    HCT 38.5 (*)    All other components within normal limits  COMPREHENSIVE METABOLIC PANEL -  Abnormal; Notable for the following components:   Glucose, Bld 121 (*)    All other components within normal limits  SARS CORONAVIRUS 2 BY RT PCR (HOSPITAL ORDER, [place] [facility])    DG Hip Unilat  With Pelvis 2-3 Views Right  Final Result      Medications  HYDROmorphone (DILAUDID) injection 1 mg (1 mg Intravenous Given 02/17/20 2148)     Procedures  /  Critical Care Procedures  ED Course and Medical Decision Making  I have reviewed the triage vital signs, the nursing notes, and pertinent available records from the EMR.  Listed above are laboratory and imaging tests that I personally ordered, reviewed, and interpreted and then considered in my medical decision making (see below for details).  Mechanical fall, x-ray appears to be a femoral neck fracture, will consult orthopedics, admit to hospital. Clinical Course as of Feb 16 2309  Mon Feb 17, 2020  2226 Case reviewed by Dr. Mardelle Matte of orthopedics who plans for surgical repair tomorrow.  Patient made n.p.o. at midnight.  Will admit to hospitalist service.   [MB]    Clinical Course User Index [MB] Sedonia Small Barth Kirks, MD       Barth Kirks. Sedonia Small, [place] [email or web address]  Final Clinical Impressions(s) / ED  Diagnoses     ICD-10-CM   1. Closed fracture of neck of right femur, initial encounter [facility])  S72.001A     ED Discharge Orders    None       Discharge Instructions Discussed with and Provided to Patient:   Discharge Instructions   None       Maudie Flakes, MD 02/17/20 2310

## 2020-02-17 NOTE — Progress Notes (Signed)
Patient with complex cardiac history, now with new right hip fracture.    Will need preoperative optimization, cardiac evaluation, and then surgical intervention with right hip replacement.   I'm not sure if he needs to be at [facility] for cardiology support, would defer to cardiology.  He was apparently scheduled to undergo some cardiac procedure for his pacemaker next month.    Full consult to follow, possible surgery 8/31 if optimized.  NPO after midnight in case we can get him optimized for tomorrow.    Johnny Bridge, MD

## 2020-02-18 ENCOUNTER — Encounter (HOSPITAL_COMMUNITY): Payer: Self-pay | Admitting: Internal Medicine

## 2020-02-18 ENCOUNTER — Inpatient Hospital Stay (HOSPITAL_COMMUNITY): Payer: Medicare Other | Admitting: Anesthesiology

## 2020-02-18 ENCOUNTER — Encounter (HOSPITAL_COMMUNITY)
Admission: EM | Disposition: A | Discharge status: Skilled Nursing Facility | Payer: Self-pay | Source: Home / Self Care | Attending: Internal Medicine

## 2020-02-18 ENCOUNTER — Inpatient Hospital Stay (HOSPITAL_COMMUNITY): Payer: Medicare Other

## 2020-02-18 DIAGNOSIS — J302 Other seasonal allergic rhinitis: Secondary | ICD-10-CM | POA: Diagnosis present

## 2020-02-18 DIAGNOSIS — R413 Other amnesia: Secondary | ICD-10-CM | POA: Diagnosis present

## 2020-02-18 DIAGNOSIS — Z9889 Other specified postprocedural states: Secondary | ICD-10-CM | POA: Diagnosis not present

## 2020-02-18 DIAGNOSIS — Z431 Encounter for attention to gastrostomy: Secondary | ICD-10-CM | POA: Diagnosis not present

## 2020-02-18 DIAGNOSIS — S79929A Unspecified injury of unspecified thigh, initial encounter: Secondary | ICD-10-CM | POA: Diagnosis not present

## 2020-02-18 DIAGNOSIS — Z9359 Other cystostomy status: Secondary | ICD-10-CM | POA: Diagnosis not present

## 2020-02-18 DIAGNOSIS — Z743 Need for continuous supervision: Secondary | ICD-10-CM | POA: Diagnosis not present

## 2020-02-18 DIAGNOSIS — N368 Other specified disorders of urethra: Secondary | ICD-10-CM | POA: Diagnosis present

## 2020-02-18 DIAGNOSIS — Z79899 Other long term (current) drug therapy: Secondary | ICD-10-CM | POA: Diagnosis not present

## 2020-02-18 DIAGNOSIS — I2510 Atherosclerotic heart disease of native coronary artery without angina pectoris: Secondary | ICD-10-CM | POA: Diagnosis not present

## 2020-02-18 DIAGNOSIS — Z01810 Encounter for preprocedural cardiovascular examination: Secondary | ICD-10-CM

## 2020-02-18 DIAGNOSIS — Z950 Presence of cardiac pacemaker: Secondary | ICD-10-CM

## 2020-02-18 DIAGNOSIS — Z751 Person awaiting admission to adequate facility elsewhere: Secondary | ICD-10-CM | POA: Diagnosis not present

## 2020-02-18 DIAGNOSIS — Z8249 Family history of ischemic heart disease and other diseases of the circulatory system: Secondary | ICD-10-CM | POA: Diagnosis not present

## 2020-02-18 DIAGNOSIS — D62 Acute posthemorrhagic anemia: Secondary | ICD-10-CM | POA: Diagnosis not present

## 2020-02-18 DIAGNOSIS — Z471 Aftercare following joint replacement surgery: Secondary | ICD-10-CM | POA: Diagnosis not present

## 2020-02-18 DIAGNOSIS — K5900 Constipation, unspecified: Secondary | ICD-10-CM | POA: Diagnosis present

## 2020-02-18 DIAGNOSIS — Z7401 Bed confinement status: Secondary | ICD-10-CM | POA: Diagnosis not present

## 2020-02-18 DIAGNOSIS — R339 Retention of urine, unspecified: Secondary | ICD-10-CM | POA: Diagnosis present

## 2020-02-18 DIAGNOSIS — R4702 Dysphasia: Secondary | ICD-10-CM | POA: Diagnosis present

## 2020-02-18 DIAGNOSIS — S72001A Fracture of unspecified part of neck of right femur, initial encounter for closed fracture: Secondary | ICD-10-CM | POA: Diagnosis not present

## 2020-02-18 DIAGNOSIS — Z930 Tracheostomy status: Secondary | ICD-10-CM | POA: Diagnosis not present

## 2020-02-18 DIAGNOSIS — R1310 Dysphagia, unspecified: Secondary | ICD-10-CM

## 2020-02-18 DIAGNOSIS — Z931 Gastrostomy status: Secondary | ICD-10-CM | POA: Diagnosis not present

## 2020-02-18 DIAGNOSIS — S7291XS Unspecified fracture of right femur, sequela: Secondary | ICD-10-CM | POA: Diagnosis not present

## 2020-02-18 DIAGNOSIS — N9989 Other postprocedural complications and disorders of genitourinary system: Secondary | ICD-10-CM | POA: Diagnosis not present

## 2020-02-18 DIAGNOSIS — Z87891 Personal history of nicotine dependence: Secondary | ICD-10-CM | POA: Diagnosis not present

## 2020-02-18 DIAGNOSIS — Z4732 Aftercare following explantation of hip joint prosthesis: Secondary | ICD-10-CM | POA: Diagnosis not present

## 2020-02-18 DIAGNOSIS — I442 Atrioventricular block, complete: Secondary | ICD-10-CM | POA: Diagnosis not present

## 2020-02-18 DIAGNOSIS — Z960 Presence of urogenital implants: Secondary | ICD-10-CM | POA: Diagnosis not present

## 2020-02-18 DIAGNOSIS — M2550 Pain in unspecified joint: Secondary | ICD-10-CM | POA: Diagnosis not present

## 2020-02-18 DIAGNOSIS — Z9181 History of falling: Secondary | ICD-10-CM | POA: Diagnosis not present

## 2020-02-18 DIAGNOSIS — Z96641 Presence of right artificial hip joint: Secondary | ICD-10-CM | POA: Diagnosis not present

## 2020-02-18 DIAGNOSIS — K219 Gastro-esophageal reflux disease without esophagitis: Secondary | ICD-10-CM | POA: Diagnosis present

## 2020-02-18 DIAGNOSIS — Z45010 Encounter for checking and testing of cardiac pacemaker pulse generator [battery]: Secondary | ICD-10-CM | POA: Diagnosis not present

## 2020-02-18 DIAGNOSIS — G510 Bell's palsy: Secondary | ICD-10-CM | POA: Diagnosis present

## 2020-02-18 DIAGNOSIS — I214 Non-ST elevation (NSTEMI) myocardial infarction: Secondary | ICD-10-CM | POA: Diagnosis not present

## 2020-02-18 DIAGNOSIS — I10 Essential (primary) hypertension: Secondary | ICD-10-CM | POA: Diagnosis not present

## 2020-02-18 DIAGNOSIS — Z825 Family history of asthma and other chronic lower respiratory diseases: Secondary | ICD-10-CM | POA: Diagnosis not present

## 2020-02-18 DIAGNOSIS — W010XXA Fall on same level from slipping, tripping and stumbling without subsequent striking against object, initial encounter: Secondary | ICD-10-CM | POA: Diagnosis present

## 2020-02-18 DIAGNOSIS — Z20822 Contact with and (suspected) exposure to covid-19: Secondary | ICD-10-CM | POA: Diagnosis present

## 2020-02-18 HISTORY — PX: HIP ARTHROPLASTY: SHX981

## 2020-02-18 LAB — COMPREHENSIVE METABOLIC PANEL
ALT: 28 U/L (ref 0–44)
AST: 19 U/L (ref 15–41)
Albumin: 3.8 g/dL (ref 3.5–5.0)
Alkaline Phosphatase: 71 U/L (ref 38–126)
Anion gap: 9 (ref 5–15)
BUN: 17 mg/dL (ref 8–23)
CO2: 27 mmol/L (ref 22–32)
Calcium: 8.6 mg/dL — ABNORMAL LOW (ref 8.9–10.3)
Chloride: 104 mmol/L (ref 98–111)
Creatinine, Ser: 0.65 mg/dL (ref 0.61–1.24)
GFR calc Af Amer: >60 mL/min (ref >60)
GFR calc non Af Amer: >60 mL/min (ref >60)
Glucose, Bld: 110 mg/dL — ABNORMAL HIGH (ref 70–99)
Potassium: 3.8 mmol/L (ref 3.5–5.1)
Sodium: 140 mmol/L (ref 135–145)
Total Bilirubin: 0.6 mg/dL (ref 0.3–1.2)
Total Protein: 7.1 g/dL (ref 6.5–8.1)

## 2020-02-18 LAB — CBC WITH DIFFERENTIAL/PLATELET
Abs Immature Granulocytes: 0.03 10*3/uL (ref 0.00–0.07)
Basophils Absolute: 0 10*3/uL (ref 0.0–0.1)
Basophils Relative: 0 %
Eosinophils Absolute: 0 10*3/uL (ref 0.0–0.5)
Eosinophils Relative: 0 %
HCT: 36 % — ABNORMAL LOW (ref 39.0–52.0)
Hemoglobin: 11.6 g/dL — ABNORMAL LOW (ref 13.0–17.0)
Immature Granulocytes: 0 %
Lymphocytes Relative: 15 %
Lymphs Abs: 1.6 10*3/uL (ref 0.7–4.0)
MCH: 28.5 pg (ref 26.0–34.0)
MCHC: 32.2 g/dL (ref 30.0–36.0)
MCV: 88.5 fL (ref 80.0–100.0)
Monocytes Absolute: 1.4 10*3/uL — ABNORMAL HIGH (ref 0.1–1.0)
Monocytes Relative: 13 %
Neutro Abs: 7.9 10*3/uL — ABNORMAL HIGH (ref 1.7–7.7)
Neutrophils Relative %: 72 %
Platelets: 174 10*3/uL (ref 150–400)
RBC: 4.07 MIL/uL — ABNORMAL LOW (ref 4.22–5.81)
RDW: 14.3 % (ref 11.5–15.5)
WBC: 10.9 10*3/uL — ABNORMAL HIGH (ref 4.0–10.5)
nRBC: 0 % (ref 0.0–0.2)

## 2020-02-18 LAB — PROTIME-INR
INR: 1.1 (ref 0.8–1.2)
Prothrombin Time: 13.5 seconds (ref 11.4–15.2)

## 2020-02-18 LAB — CBG MONITORING, ED
Glucose-Capillary: 100 mg/dL — ABNORMAL HIGH (ref 70–99)
Glucose-Capillary: 104 mg/dL — ABNORMAL HIGH (ref 70–99)
Glucose-Capillary: 107 mg/dL — ABNORMAL HIGH (ref 70–99)
Glucose-Capillary: 113 mg/dL — ABNORMAL HIGH (ref 70–99)

## 2020-02-18 LAB — APTT: aPTT: 31 seconds (ref 24–36)

## 2020-02-18 LAB — MAGNESIUM: Magnesium: 2 mg/dL (ref 1.7–2.4)

## 2020-02-18 SURGERY — HEMIARTHROPLASTY, HIP, DIRECT ANTERIOR APPROACH, FOR FRACTURE
Anesthesia: General | Site: Hip | Laterality: Right

## 2020-02-18 MED ORDER — CHLORHEXIDINE GLUCONATE 0.12 % MT SOLN
15.0000 mL | Freq: Once | OROMUCOSAL | Status: AC
  Administered 2020-02-18: 15 mL via OROMUCOSAL

## 2020-02-18 MED ORDER — SODIUM CHLORIDE 0.9 % IR SOLN
Status: DC | PRN
  Administered 2020-02-18: 3000 mL

## 2020-02-18 MED ORDER — ACETAMINOPHEN 160 MG/5ML PO SOLN
500.0000 mg | Freq: Four times a day (QID) | ORAL | Status: AC
  Administered 2020-02-18 – 2020-02-19 (×4): 500 mg
  Filled 2020-02-18 (×4): qty 20.3

## 2020-02-18 MED ORDER — ONDANSETRON HCL 4 MG/2ML IJ SOLN
INTRAMUSCULAR | Status: AC
  Filled 2020-02-18: qty 2

## 2020-02-18 MED ORDER — FUROSEMIDE 40 MG PO TABS: 20.0000 mg | ORAL_TABLET | Freq: Every day | ORAL | Status: DC

## 2020-02-18 MED ORDER — PHENYLEPHRINE 40 MCG/ML (10ML) SYRINGE FOR IV PUSH (FOR BLOOD PRESSURE SUPPORT)
PREFILLED_SYRINGE | INTRAVENOUS | Status: DC | PRN
  Administered 2020-02-18: 40 ug via INTRAVENOUS
  Administered 2020-02-18: 80 ug via INTRAVENOUS
  Administered 2020-02-18: 120 ug via INTRAVENOUS

## 2020-02-18 MED ORDER — FENTANYL CITRATE (PF) 100 MCG/2ML IJ SOLN
INTRAMUSCULAR | Status: DC | PRN
Start: 2020-02-18 — End: 2020-02-18
  Administered 2020-02-18 (×4): 50 ug via INTRAVENOUS

## 2020-02-18 MED ORDER — POLYETHYLENE GLYCOL 3350 17 G PO PACK: 17.0000 g | PACK | Freq: Every day | ORAL | Status: DC | PRN

## 2020-02-18 MED ORDER — PROPOFOL 10 MG/ML IV BOLUS
INTRAVENOUS | Status: AC
  Filled 2020-02-18: qty 20

## 2020-02-18 MED ORDER — 0.9 % SODIUM CHLORIDE (POUR BTL) OPTIME
TOPICAL | Status: DC | PRN
  Administered 2020-02-18: 1000 mL

## 2020-02-18 MED ORDER — PROPOFOL 10 MG/ML IV BOLUS
INTRAVENOUS | Status: DC | PRN
  Administered 2020-02-18: 50 mg via INTRAVENOUS

## 2020-02-18 MED ORDER — ARTIFICIAL TEARS OPHTHALMIC OINT
TOPICAL_OINTMENT | OPHTHALMIC | Status: DC
  Administered 2020-02-21 – 2020-02-25 (×8): 1 via OPHTHALMIC
  Filled 2020-02-18 (×2): qty 3.5

## 2020-02-18 MED ORDER — PHENYLEPHRINE HCL (PRESSORS) 10 MG/ML IV SOLN
INTRAVENOUS | Status: AC
  Filled 2020-02-18: qty 1

## 2020-02-18 MED ORDER — ONDANSETRON HCL 4 MG/2ML IJ SOLN: 4.0000 mg | Freq: Once | INTRAMUSCULAR | Status: DC | PRN

## 2020-02-18 MED ORDER — HYDROCODONE-ACETAMINOPHEN 5-325 MG PO TABS
1.0000 | ORAL_TABLET | ORAL | Status: DC | PRN
  Administered 2020-02-20: 1 via ORAL
  Administered 2020-02-20: 2 via ORAL
  Administered 2020-02-22: 1 via ORAL
  Administered 2020-02-22: 2 via ORAL
  Administered 2020-02-24 – 2020-02-27 (×6): 1 via ORAL
  Filled 2020-02-18 (×7): qty 1
  Filled 2020-02-18 (×2): qty 2
  Filled 2020-02-18: qty 1

## 2020-02-18 MED ORDER — ACETAMINOPHEN 650 MG RE SUPP: 650.0000 mg | Freq: Four times a day (QID) | RECTAL | Status: DC | PRN

## 2020-02-18 MED ORDER — PRO-STAT SUGAR FREE PO LIQD: 30.0000 mL | Freq: Two times a day (BID) | ORAL | Status: DC

## 2020-02-18 MED ORDER — MIDAZOLAM HCL 5 MG/5ML IJ SOLN
INTRAMUSCULAR | Status: DC | PRN
  Administered 2020-02-18: 2 mg via INTRAVENOUS

## 2020-02-18 MED ORDER — OXYCODONE HCL 5 MG/5ML PO SOLN
ORAL | Status: AC
  Administered 2020-02-18: 5 mg via ORAL
  Filled 2020-02-18: qty 5

## 2020-02-18 MED ORDER — LISINOPRIL 10 MG PO TABS
5.0000 mg | ORAL_TABLET | Freq: Every day | ORAL | Status: DC
  Administered 2020-02-18: 5 mg
  Filled 2020-02-18: qty 1

## 2020-02-18 MED ORDER — STERILE WATER FOR IRRIGATION IR SOLN
Status: DC | PRN
  Administered 2020-02-18: 2000 mL

## 2020-02-18 MED ORDER — FENTANYL CITRATE (PF) 250 MCG/5ML IJ SOLN
INTRAMUSCULAR | Status: AC
Start: 2020-02-18 — End: ?
  Filled 2020-02-18: qty 5

## 2020-02-18 MED ORDER — PANTOPRAZOLE SODIUM 40 MG PO PACK
40.0000 mg | PACK | Freq: Every day | ORAL | Status: DC
  Administered 2020-02-19 – 2020-02-27 (×8): 40 mg
  Filled 2020-02-18 (×10): qty 20

## 2020-02-18 MED ORDER — OXYCODONE HCL 5 MG/5ML PO SOLN: 5.0000 mg | Freq: Once | ORAL | Status: AC | PRN

## 2020-02-18 MED ORDER — ROCURONIUM BROMIDE 10 MG/ML (PF) SYRINGE
PREFILLED_SYRINGE | INTRAVENOUS | Status: AC
  Filled 2020-02-18: qty 10

## 2020-02-18 MED ORDER — OXYCODONE HCL 5 MG PO TABS: 5.0000 mg | ORAL_TABLET | Freq: Once | ORAL | Status: AC | PRN

## 2020-02-18 MED ORDER — MIDAZOLAM HCL 2 MG/2ML IJ SOLN
INTRAMUSCULAR | Status: AC
  Filled 2020-02-18: qty 2

## 2020-02-18 MED ORDER — VANCOMYCIN HCL 1000 MG IV SOLR
INTRAVENOUS | Status: AC
  Filled 2020-02-18: qty 1000

## 2020-02-18 MED ORDER — QUETIAPINE FUMARATE 25 MG PO TABS
25.0000 mg | ORAL_TABLET | Freq: Every day | ORAL | Status: DC
  Administered 2020-02-18 – 2020-02-26 (×10): 25 mg
  Filled 2020-02-18 (×10): qty 1

## 2020-02-18 MED ORDER — ROCURONIUM BROMIDE 10 MG/ML (PF) SYRINGE
PREFILLED_SYRINGE | INTRAVENOUS | Status: DC | PRN
  Administered 2020-02-18: 50 mg via INTRAVENOUS
  Administered 2020-02-18: 10 mg via INTRAVENOUS

## 2020-02-18 MED ORDER — FENTANYL CITRATE (PF) 100 MCG/2ML IJ SOLN
INTRAMUSCULAR | Status: AC
  Filled 2020-02-18: qty 2

## 2020-02-18 MED ORDER — CEFAZOLIN SODIUM-DEXTROSE 1-4 GM/50ML-% IV SOLN
INTRAVENOUS | Status: AC
  Filled 2020-02-18: qty 50

## 2020-02-18 MED ORDER — CEFAZOLIN SODIUM-DEXTROSE 2-4 GM/100ML-% IV SOLN
INTRAVENOUS | Status: AC
  Filled 2020-02-18: qty 100

## 2020-02-18 MED ORDER — DEXAMETHASONE SODIUM PHOSPHATE 10 MG/ML IJ SOLN
INTRAMUSCULAR | Status: AC
  Filled 2020-02-18: qty 1

## 2020-02-18 MED ORDER — ONDANSETRON HCL 4 MG/2ML IJ SOLN: 4.0000 mg | Freq: Four times a day (QID) | INTRAMUSCULAR | Status: DC | PRN

## 2020-02-18 MED ORDER — LACTATED RINGERS IV SOLN: INTRAVENOUS | Status: DC

## 2020-02-18 MED ORDER — BETHANECHOL CHLORIDE 10 MG PO TABS
10.0000 mg | ORAL_TABLET | Freq: Three times a day (TID) | ORAL | Status: DC
  Administered 2020-02-18 – 2020-02-27 (×25): 10 mg
  Filled 2020-02-18 (×29): qty 1

## 2020-02-18 MED ORDER — FENTANYL CITRATE (PF) 100 MCG/2ML IJ SOLN
25.0000 ug | INTRAMUSCULAR | Status: DC | PRN
  Administered 2020-02-18 (×2): 25 ug via INTRAVENOUS

## 2020-02-18 MED ORDER — AMLODIPINE BESYLATE 5 MG PO TABS
10.0000 mg | ORAL_TABLET | Freq: Every day | ORAL | Status: DC
  Administered 2020-02-18: 10 mg
  Filled 2020-02-18: qty 2

## 2020-02-18 MED ORDER — CEFAZOLIN SODIUM-DEXTROSE 2-4 GM/100ML-% IV SOLN
2.0000 g | Freq: Four times a day (QID) | INTRAVENOUS | Status: AC
  Administered 2020-02-18 – 2020-02-19 (×2): 2 g via INTRAVENOUS
  Filled 2020-02-18: qty 100

## 2020-02-18 MED ORDER — ONDANSETRON HCL 4 MG PO TABS: 4.0000 mg | ORAL_TABLET | Freq: Four times a day (QID) | ORAL | Status: DC | PRN

## 2020-02-18 MED ORDER — KETOROLAC TROMETHAMINE 15 MG/ML IJ SOLN
7.5000 mg | Freq: Four times a day (QID) | INTRAMUSCULAR | Status: AC
  Administered 2020-02-18 – 2020-02-19 (×2): 7.5 mg via INTRAVENOUS
  Filled 2020-02-18 (×2): qty 1

## 2020-02-18 MED ORDER — HYDROMORPHONE HCL 1 MG/ML IJ SOLN: 0.5000 mg | INTRAMUSCULAR | Status: DC | PRN

## 2020-02-18 MED ORDER — DOCUSATE SODIUM 50 MG/5ML PO LIQD
100.0000 mg | Freq: Every day | ORAL | Status: DC
  Administered 2020-02-19 – 2020-02-27 (×8): 100 mg
  Filled 2020-02-18 (×10): qty 10

## 2020-02-18 MED ORDER — GUAIFENESIN 100 MG/5ML PO SOLN: 200.0000 mg | Freq: Three times a day (TID) | ORAL | Status: DC | PRN

## 2020-02-18 MED ORDER — SODIUM CHLORIDE 0.9 % IV SOLN: INTRAVENOUS | Status: DC

## 2020-02-18 MED ORDER — KETOROLAC TROMETHAMINE 15 MG/ML IJ SOLN
INTRAMUSCULAR | Status: AC
  Administered 2020-02-18: 7.5 mg via INTRAVENOUS
  Filled 2020-02-18: qty 1

## 2020-02-18 MED ORDER — MORPHINE SULFATE (PF) 4 MG/ML IV SOLN
0.5000 mg | INTRAVENOUS | Status: DC | PRN
  Administered 2020-02-19 – 2020-02-20 (×4): 1 mg via INTRAVENOUS
  Filled 2020-02-18 (×4): qty 1

## 2020-02-18 MED ORDER — CHLORHEXIDINE GLUCONATE CLOTH 2 % EX PADS
6.0000 | MEDICATED_PAD | Freq: Every day | CUTANEOUS | Status: DC
  Administered 2020-02-18 – 2020-02-25 (×8): 6 via TOPICAL

## 2020-02-18 MED ORDER — PHENYLEPHRINE HCL-NACL 10-0.9 MG/250ML-% IV SOLN
INTRAVENOUS | Status: DC | PRN
  Administered 2020-02-18: 40 ug/min via INTRAVENOUS

## 2020-02-18 MED ORDER — LIDOCAINE 2% (20 MG/ML) 5 ML SYRINGE
INTRAMUSCULAR | Status: DC | PRN
  Administered 2020-02-18: 60 mg via INTRAVENOUS

## 2020-02-18 MED ORDER — HYDROMORPHONE HCL 1 MG/ML IJ SOLN
1.0000 mg | INTRAMUSCULAR | Status: DC | PRN
  Administered 2020-02-18 (×3): 1 mg via INTRAVENOUS
  Filled 2020-02-18 (×3): qty 1

## 2020-02-18 MED ORDER — ACETAMINOPHEN 500 MG PO TABS: 500.0000 mg | ORAL_TABLET | Freq: Four times a day (QID) | ORAL | Status: DC

## 2020-02-18 MED ORDER — DEXAMETHASONE SODIUM PHOSPHATE 10 MG/ML IJ SOLN
INTRAMUSCULAR | Status: DC | PRN
  Administered 2020-02-18: 8 mg via INTRAVENOUS

## 2020-02-18 MED ORDER — SUGAMMADEX SODIUM 200 MG/2ML IV SOLN
INTRAVENOUS | Status: DC | PRN
  Administered 2020-02-18: 200 mg via INTRAVENOUS

## 2020-02-18 MED ORDER — HYDROCODONE-ACETAMINOPHEN 7.5-325 MG PO TABS
1.0000 | ORAL_TABLET | ORAL | Status: DC | PRN
  Administered 2020-02-19: 2 via ORAL
  Administered 2020-02-20 – 2020-02-21 (×2): 1 via ORAL
  Administered 2020-02-21 – 2020-02-23 (×4): 2 via ORAL
  Administered 2020-02-23 – 2020-02-27 (×5): 1 via ORAL
  Filled 2020-02-18: qty 2
  Filled 2020-02-18 (×4): qty 1
  Filled 2020-02-18: qty 2
  Filled 2020-02-18: qty 1
  Filled 2020-02-18: qty 2
  Filled 2020-02-18: qty 1
  Filled 2020-02-18 (×2): qty 2
  Filled 2020-02-18: qty 1

## 2020-02-18 MED ORDER — ENOXAPARIN SODIUM 40 MG/0.4ML ~~LOC~~ SOLN
40.0000 mg | SUBCUTANEOUS | Status: DC
  Administered 2020-02-19 – 2020-02-20 (×2): 40 mg via SUBCUTANEOUS
  Filled 2020-02-18 (×2): qty 0.4

## 2020-02-18 MED ORDER — LIDOCAINE 2% (20 MG/ML) 5 ML SYRINGE
INTRAMUSCULAR | Status: AC
  Filled 2020-02-18: qty 5

## 2020-02-18 MED ORDER — METHOCARBAMOL 500 MG IVPB - SIMPLE MED
500.0000 mg | Freq: Four times a day (QID) | INTRAVENOUS | Status: DC | PRN
  Filled 2020-02-18: qty 50

## 2020-02-18 MED ORDER — ACETAMINOPHEN 325 MG PO TABS: 650.0000 mg | ORAL_TABLET | Freq: Four times a day (QID) | ORAL | Status: DC | PRN

## 2020-02-18 MED ORDER — METHOCARBAMOL 500 MG PO TABS
500.0000 mg | ORAL_TABLET | Freq: Four times a day (QID) | ORAL | Status: DC | PRN
  Administered 2020-02-20 – 2020-02-27 (×6): 500 mg via ORAL
  Filled 2020-02-18 (×7): qty 1

## 2020-02-18 MED ORDER — IPRATROPIUM-ALBUTEROL 0.5-2.5 (3) MG/3ML IN SOLN: 3.0000 mL | Freq: Four times a day (QID) | RESPIRATORY_TRACT | Status: DC | PRN

## 2020-02-18 MED ORDER — PROSOURCE TF PO LIQD
45.0000 mL | Freq: Two times a day (BID) | ORAL | Status: DC
  Administered 2020-02-18 – 2020-02-25 (×13): 45 mL
  Filled 2020-02-18 (×16): qty 45

## 2020-02-18 SURGICAL SUPPLY — 55 items
BAG URINE DRAIN 2000ML AR STRL (UROLOGICAL SUPPLIES) ×2 IMPLANT
BIT DRILL 2.0X128 (BIT) ×2 IMPLANT
BLADE SAW SAG 73X25 THK (BLADE) ×1
BLADE SAW SGTL 73X25 THK (BLADE) ×1 IMPLANT
CLSR STERI-STRIP ANTIMIC 1/2X4 (GAUZE/BANDAGES/DRESSINGS) ×2 IMPLANT
COVER SURGICAL LIGHT HANDLE (MISCELLANEOUS) ×2 IMPLANT
COVER WAND RF STERILE (DRAPES) IMPLANT
DRAPE INCISE IOBAN 66X45 STRL (DRAPES) ×4 IMPLANT
DRAPE ORTHO SPLIT 77X108 STRL (DRAPES) ×2
DRAPE SURG ORHT 6 SPLT 77X108 (DRAPES) ×2 IMPLANT
DRAPE U-SHAPE 47X51 STRL (DRAPES) ×2 IMPLANT
DRSG MEPILEX BORDER 4X8 (GAUZE/BANDAGES/DRESSINGS) ×2 IMPLANT
DURAPREP 26ML APPLICATOR (WOUND CARE) ×4 IMPLANT
ELECT REM PT RETURN 15FT ADLT (MISCELLANEOUS) ×2 IMPLANT
GLOVE BIO SURGEON STRL SZ7 (GLOVE) ×2 IMPLANT
GLOVE BIOGEL PI IND STRL 7.0 (GLOVE) ×1 IMPLANT
GLOVE BIOGEL PI IND STRL 8 (GLOVE) ×1 IMPLANT
GLOVE BIOGEL PI INDICATOR 7.0 (GLOVE) ×1
GLOVE BIOGEL PI INDICATOR 8 (GLOVE) ×1
GLOVE ORTHO TXT STRL SZ7.5 (GLOVE) ×2 IMPLANT
GOWN STRL REUS W/TWL LRG LVL3 (GOWN DISPOSABLE) ×4 IMPLANT
HANDPIECE INTERPULSE COAX TIP (DISPOSABLE) ×1
HEAD FEM UNIPOLAR 54 OD STRL (Hips) ×2 IMPLANT
HOLDER FOLEY CATH W/STRAP (MISCELLANEOUS) ×2 IMPLANT
HOOD PEEL AWAY FLYTE STAYCOOL (MISCELLANEOUS) ×6 IMPLANT
IMMOBILIZER KNEE 20 (SOFTGOODS) ×2
IMMOBILIZER KNEE 20 THIGH 36 (SOFTGOODS) ×1 IMPLANT
KIT BASIN OR (CUSTOM PROCEDURE TRAY) ×2 IMPLANT
KIT TURNOVER KIT A (KITS) IMPLANT
NS IRRIG 1000ML POUR BTL (IV SOLUTION) ×2 IMPLANT
PACK TOTAL JOINT (CUSTOM PROCEDURE TRAY) ×2 IMPLANT
PENCIL SMOKE EVACUATOR (MISCELLANEOUS) ×2 IMPLANT
PROTECTOR NERVE ULNAR (MISCELLANEOUS) ×2 IMPLANT
RETRIEVER SUT HEWSON (MISCELLANEOUS) ×2 IMPLANT
SET HNDPC FAN SPRY TIP SCT (DISPOSABLE) ×1 IMPLANT
SPACER FEM TAPERED +5 12/14 (Hips) ×2 IMPLANT
SPONGE LAP 4X18 RFD (DISPOSABLE) IMPLANT
STEM SUMMIT PRESSFIT SZ 6 (Hips) ×2 IMPLANT
STRIP CLOSURE SKIN 1/4X4 (GAUZE/BANDAGES/DRESSINGS) ×4 IMPLANT
SUCTION FRAZIER HANDLE 12FR (TUBING) ×1
SUCTION TUBE FRAZIER 12FR DISP (TUBING) ×1 IMPLANT
SUT ETHIBOND NAB CT1 #1 30IN (SUTURE) ×2 IMPLANT
SUT FIBERWIRE #2 38 REV NDL BL (SUTURE) ×6
SUT MNCRL AB 4-0 PS2 18 (SUTURE) IMPLANT
SUT VIC AB 0 CT1 36 (SUTURE) ×2 IMPLANT
SUT VIC AB 1 CT1 27 (SUTURE)
SUT VIC AB 1 CT1 27XBRD ANTBC (SUTURE) IMPLANT
SUT VIC AB 2-0 CT1 27 (SUTURE) ×2
SUT VIC AB 2-0 CT1 TAPERPNT 27 (SUTURE) ×2 IMPLANT
SUT VIC AB 3-0 SH 8-18 (SUTURE) ×4 IMPLANT
SUTURE FIBERWR#2 38 REV NDL BL (SUTURE) ×3 IMPLANT
TOWEL OR 17X26 10 PK STRL BLUE (TOWEL DISPOSABLE) ×6 IMPLANT
TOWER CARTRIDGE SMART MIX (DISPOSABLE) IMPLANT
TRAY FOLEY MTR SLVR 16FR STAT (SET/KITS/TRAYS/PACK) IMPLANT
WATER STERILE IRR 1000ML POUR (IV SOLUTION) ×4 IMPLANT

## 2020-02-18 NOTE — Anesthesia Preprocedure Evaluation (Addendum)
Anesthesia Evaluation  Patient identified by MRN, date of birth, ID band Patient awake  General Assessment Comment:Patient awake  Reviewed: Allergy & Precautions, NPO status , Patient's Chart, lab work & pertinent test results  Airway Mallampati: I  TM Distance: >3 FB Neck ROM: Full   Comment: Trach 4.78mm uncuffed shiley in place Dental no notable dental hx. (+) Poor Dentition, Dental Advisory Given,    Pulmonary asthma , former smoker,  Quit smoking 1991   Pulmonary exam normal breath sounds clear to auscultation       Cardiovascular hypertension, Pt. 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 Residual gait instability  Short term memory issues after fall in 2013  negative psych ROS   GI/Hepatic Neg liver ROS, GERD  Medicated and Controlled,Dysphagia, significant aspiration risk- getting tube feeds    Endo/Other  negative endocrine ROS  Renal/GU Renal diseaseUrethral stricture s/p suprapubic catheter  negative genitourinary   Musculoskeletal r hip fx s/p fall 8/30   Abdominal Normal abdominal exam  (+)   Peds  Hematology  (+) Blood dyscrasia, anemia , hct 36, plt 174   Anesthesia Other Findings   Reproductive/Obstetrics negative OB ROS                           Anesthesia Physical Anesthesia Plan  ASA: IV  Anesthesia Plan: General     Post-op Pain Management:    Induction: Intravenous, Rapid sequence and Cricoid pressure planned  PONV Risk Score and Plan: Ondansetron, Dexamethasone and Treatment may vary due to age or medical condition  Airway Management Planned: Tracheostomy  Additional Equipment: None  Intra-op Plan:   Post-operative Plan: Extubation in OR  Informed Consent: I have reviewed the patients History and Physical, chart, labs and discussed the procedure including the risks, benefits and alternatives for the proposed anesthesia with the patient or authorized representative who has indicated his/her understanding and acceptance.     Dental advisory given  Plan Discussed with: CRNA  Anesthesia Plan Comments:       Anesthesia Quick Evaluation

## 2020-02-18 NOTE — Anesthesia Postprocedure Evaluation (Signed)
Anesthesia Post Note  Patient: Casey Richard  Procedure(s) Performed: ARTHROPLASTY BIPOLAR HIP (HEMIARTHROPLASTY) (Right Hip)     Patient location during evaluation: PACU Anesthesia Type: General Level of consciousness: awake and alert, oriented and patient cooperative Pain management: pain level controlled Vital Signs Assessment: post-procedure vital signs reviewed and stable Respiratory status: spontaneous breathing, nonlabored ventilation and respiratory function stable Cardiovascular status: blood pressure returned to baseline and stable Postop Assessment: no apparent nausea or vomiting Anesthetic complications: no   No complications documented.  Last Vitals:  Vitals:   02/18/20 1243 02/18/20 1607  BP: (!) 145/79 (!) 147/73  Pulse: 83 90  Resp: 16 18  Temp: 36.9 C 37.6 C  SpO2: 98% 96%    Last Pain:  Vitals:   02/18/20 1607  TempSrc:   PainSc: [place]

## 2020-02-18 NOTE — ED Notes (Signed)
Scott with [employer]...  Reports that last check was on 27th of August. He does report pt's battery is low but that everything looks fine with the device. He reports no arrhythmias or lead issues.

## 2020-02-18 NOTE — Op Note (Signed)
02/17/2020 - 02/18/2020  3:27 PM  PATIENT:  Casey Richard   MRN: [id number]  PRE-OPERATIVE DIAGNOSIS: Right displaced femoral neck fracture  POST-OPERATIVE DIAGNOSIS: Same  PROCEDURE:  Procedure(s): Right hip hemiarthroplasty  PREOPERATIVE INDICATIONS:  EBRIMA RANTA is an 63 y.o. male who was admitted 02/17/2020 with a diagnosis of Closed displaced fracture of right femoral neck ([place]) and elected for surgical management.  The risks benefits and alternatives were discussed with the patient including but not limited to the risks of nonoperative treatment, versus surgical intervention including infection, bleeding, nerve injury, periprosthetic fracture, the need for revision surgery, dislocation, leg length discrepancy, blood clots, cardiopulmonary complications, morbidity, mortality, among others, and they were willing to proceed.  Predicted outcome is good, although there will be at least a six to nine month expected recovery.   OPERATIVE REPORT     SURGEON:  Marchia Bond, MD    ASSISTANT:  Merlene Pulling, PA-C,   (Present throughout the entire procedure,  necessary for completion of procedure in a timely manner, assisting with retraction, instrumentation, and closure)     ANESTHESIA: General  ESTIMATED BLOOD LOSS: 573 mL    COMPLICATIONS:  None.   UNIQUE ASPECTS OF THE CASE: The fracture was fairly vertical, and extended almost to the level of the lesser trochanter.  The implant was potted slightly proud, maybe one tooth up, but felt stable, although the medial calcar extension of the fracture went inferior, but not below the lesser trochanter.     COMPONENTS:  Chemical engineer Femoral Fracture stem size six, with a +5 spacer and a size 54 fracture head unipolar hip ball.    PROCEDURE IN DETAIL: The patient was met in the holding area and identified.  The appropriate hip  was marked at the operative site. The patient was then transported to the OR and  placed under  anesthesia.  At that point, the patient was  placed in the lateral decubitus position with the operative side up and  secured to the operating room table and all bony prominences padded.     The operative lower extremity was prepped from the iliac crest to the toes.  Sterile draping was performed.  Time out was performed prior to incision.      A routine posterolateral approach was utilized via sharp dissection  carried down to the subcutaneous tissue.  Gross bleeders were Bovie  coagulated.  The iliotibial band was identified and incised  along the length of the skin incision.  Self-retaining retractors were  inserted.  With the hip internally rotated, the short external rotators  were identified. The piriformis was tagged with FiberWire, and the hip capsule released in a T-type fashion.  The femoral neck was exposed, and I resected the femoral neck using the appropriate jig. This was performed at approximately a thumb's breadth above the lesser trochanter.    I then exposed the deep acetabulum, cleared out any tissue including the ligamentum teres, and included the hip capsule in the FiberWire used above and below the T.    I then prepared the proximal femur using the cookie-cutter, the lateralizing reamer, and then sequentially broached.  A trial utilized, and I reduced the hip and it was found to have excellent stability with functional range of motion. The trial components were then removed.   I then place the real implant and I impacted the real head ball into place. The hip was then reduced and taken through functional range of motion and  found to have excellent stability. Leg lengths were restored.  I then used a 2 mm drill bits to pass the FiberWire suture from the capsule and piriformis through the greater trochanter, and secured this. Excellent posterior capsular repair was achieved. I also closed the T in the capsule.  I then irrigated the hip copiously again with pulse lavage, and  repaired the fascia with Vicryl, followed by Vicryl for the subcutaneous tissue, Monocryl for the skin, Steri-Strips and sterile gauze. The wounds were injected. The patient was then awakened and returned to PACU in stable and satisfactory condition. There were no complications.  Marchia Bond, MD Orthopedic Surgeon [phone]   02/18/2020 3:27 PM

## 2020-02-18 NOTE — H&P (View-Only) (Signed)
Cardiology Consultation:   Patient ID: Casey Richard; [id number]; Dec 30, 1955   Admit date: 02/17/2020 Date of Consult: 02/18/2020  Primary Care Provider: Benito Mccreedy, MD Primary Cardiologist: Dr. Sallyanne Kuster, MD   Patient Profile:   Casey Richard is a 64 y.o. male with a hx of CHB and NSVT s/p MDT (Medtronic Revo) dual chamber PPM placement (2013) followed by EP, CKD with hx of urethral stricture s/p suprapubic catheter placement, hx of aspiration PNA with trach/PEG tube placement who is being seen today for preoperative evaluation prior to right hip fracture repair at the request of Dr. Lupita Leash.   History of Present Illness:   Casey Richard is a 9yoM with a hx as stated above who presented to [facility] 02/17/20 s/p mechanical fall after tripping on his Foley catheter tubing resulting in right femoral hip fracture. Patient was seen by orthopedics with plans for repair 02/18/2020 however preferred for cardiology input on PPM generator change out prior to surgical intervention.  Casey Richard was initially seen by cardiology after a witnessed VF cardiac arrest with underlying complete heart block (atrial rate in the 90's) after resuscitation. Given his arrest, he was taken to the cardiac cath lab which showed no obstructive CAD with a preserved LVEF. He subsequently underwent dual chamber PPM placement 07/15/2011 per Dr. Sallyanne Kuster without complication.   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 The patient does not have any unstable cardiac conditions. He can achieve 4 METs or greater without anginal symptoms. According to [facility] and AHA guidelines, he requires no further cardiac workup prior to his noncardiac surgery.  His revised cardiac risk of peri-procedural MI or cardiac arrest following surgery is low. Given history of VF arrest with underlying complete heart block, we will have  his device interrogated prior to surgery. We will also contact our EP team to assess the need for generator change out while patient is hospitalized. Our service is available as necessary in the perioperative period.  Past Medical History:  Diagnosis Date  . Cardiac arrest ([place])   . CHB (complete heart block) ([place])   . 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 Dysrhythmia 1/13   bradycardia, VF, cardiac arrest/complete heart block with pacer inserted/LOV Dr Sallyanne Kuster 07/21/11 with interrogation and anesthesia guideline order on chart. Chest x ray 1/13 EPIC,, TEE and [facility]  1/13 EPIC  . Memory loss, short term    from fall with heart block 1/13  . Nonsustained ventricular tachycardia ([place]) 02/07/2014  . Presence of permanent cardiac pacemaker 07/15/2011   Medtronic Revo  . Seasonal allergies   . Shortness of breath     Past Surgical History:  Procedure Laterality Date  . CARDIAC CATHETERIZATION  07/09/2011   Normal coronaries  . CRANIOTOMY Right 10/07/2019   Procedure: RIGHT CRANIOTOMY TUMOR EXCISION;  Surgeon: Judith Part, MD;  Location: [place];  Service: Neurosurgery;  Laterality: Right;  . CRANIOTOMY Right 10/10/2019   Procedure: CRANIOTOMY FOR EVACUATION OF HEMATOMA;  Surgeon: Judith Part, MD;  Location: [place];  Service: Neurosurgery;  Laterality: Right;  CRANIOTOMY TUMOR EXCISION  . CYSTOSCOPY WITH URETHRAL DILATATION  08/19/2011   Procedure: CYSTOSCOPY WITH URETHRAL DILATATION;  Surgeon: Dutch Gray, MD;  Location: [facility];  Service: Urology;  Laterality: N/A;  Balloon Dilation of Urethral Stricture, retro-urethrogram, Suprapubic change   . ESOPHAGOGASTRODUODENOSCOPY N/A 10/18/2019  Procedure: ESOPHAGOGASTRODUODENOSCOPY (EGD);  Surgeon: Jesusita Oka, MD;  Location: [facility] END[facility]COPY;  Service: Endoscopy;  Laterality: N/A;  . LEFT HEART CATHETERIZATION WITH CORONARY ANGIOGRAM N/A 07/09/2011   Procedure: LEFT HEART CATHETERIZATION WITH  CORONARY ANGIOGRAM;  Surgeon: Jettie Booze, MD;  Location: [facility] CATH LAB;  Service: Cardiovascular;  Laterality: N/A;  . PACEMAKER INSERTION  07/15/11   Medtronic Revo  . PEG PLACEMENT N/A 10/18/2019   Procedure: PERCUTANEOUS ENDOSCOPIC GASTROSTOMY (PEG) PLACEMENT;  Surgeon: Jesusita Oka, MD;  Location: [place] ENDOSCOPY;  Service: Endoscopy;  Laterality: N/A;  . PERMANENT PACEMAKER INSERTION Left 07/15/2011   Procedure: PERMANENT PACEMAKER INSERTION;  Surgeon: Sanda Klein, MD;  Location: [place] CATH LAB;  Service: Cardiovascular;  Laterality: Left;  . TEMPORARY PACEMAKER INSERTION Right 07/09/2011   Procedure: TEMPORARY PACEMAKER INSERTION;  Surgeon: Jettie Booze, MD;  Location: [facility] CATH LAB;  Service: Cardiovascular;  Laterality: Right;     Prior to Admission medications   Medication Sig Start Date End Date Taking? Authorizing Provider  acetaminophen (TYLENOL) 500 MG tablet Take 1,000 mg by mouth every 6 (six) hours as needed. Pain     [provider]  Amino Acids-Protein Hydrolys (FEEDING SUPPLEMENT, PRO-STAT SUGAR FREE 64,) LIQD Place 30 mLs into feeding tube 2 (two) times daily. 11/14/19   Judith Part, MD  amLODipine (NORVASC) 10 MG tablet Place 1 tablet (10 mg total) into feeding tube daily. 11/14/19   Judith Part, MD  artificial tears (LACRILUBE) OINT ophthalmic ointment Place into the right eye every 4 (four) hours. 11/14/19   Judith Part, MD  bethanechol (URECHOLINE) 10 MG tablet Place 1 tablet (10 mg total) into feeding tube 3 (three) times daily. 11/14/19   Judith Part, MD  Cholecalciferol (HM VITAMIN D3) 4000 units CAPS Take 1 capsule by mouth daily.    [provider]  cyanocobalamin 2000 MCG tablet Take 2,000 mcg by mouth daily.    [provider]  docusate (COLACE) 50 MG/5ML liquid Place 10 mLs (100 mg total) into feeding tube daily. 11/14/19   Judith Part, MD  erythromycin ophthalmic ointment SMARTSIG:In Eye(s)  01/15/20   [provider]  furosemide (LASIX) 20 MG tablet Take 20 mg by mouth daily. 09/11/19   [provider]  guaiFENesin (ROBITUSSIN) 100 MG/5ML liquid Take 200 mg by mouth 3 (three) times daily as needed for cough. Taking 30 ml as needed for chest congestion    [provider]  heparin 5000 UNIT/ML injection Inject 1 mL (5,000 Units total) into the skin every 8 (eight) hours. 11/14/19   Judith Part, MD  ipratropium-albuterol (DUONEB) 0.5-2.5 (3) MG/3ML SOLN Take 3 mLs by nebulization every 6 (six) hours as needed. 11/14/19   Judith Part, MD  lisinopril (ZESTRIL) 5 MG tablet Place 1 tablet (5 mg total) into feeding tube daily. 11/14/19   Judith Part, MD  Multiple Vitamin (MULITIVITAMIN WITH MINERALS) TABS Take 1 tablet by mouth daily.     [provider]  Nutritional Supplements (ISOSOURCE 1.5 CAL) LIQD 1,000 mLs by PEG Tube route daily. 11/15/19   Judith Part, MD  pantoprazole sodium (PROTONIX) 40 mg/20 mL PACK Place 20 mLs (40 mg total) into feeding tube daily. 11/14/19   Judith Part, MD  QUEtiapine (SEROQUEL) 25 MG tablet Place 1 tablet (25 mg total) into feeding tube at bedtime. 11/14/19   Judith Part, MD  Water For Irrigation, Sterile (FREE WATER) SOLN Place 200 mLs into feeding tube  every 4 (four) hours. 11/14/19   Judith Part, MD    Inpatient Medications: Scheduled Meds: . amLODipine  10 mg Per Tube Daily  . artificial tears   Right Eye Q4H  . bethanechol  10 mg Per Tube TID  . chlorhexidine  60 mL Topical Once  . docusate  100 mg Per Tube Daily  . feeding supplement (PRO-STAT SUGAR FREE 64)  30 mL Per Tube BID  . furosemide  20 mg Oral Daily  . lisinopril  5 mg Per Tube Daily  . pantoprazole sodium  40 mg Per Tube Daily  . povidone-iodine  2 application Topical Once  . QUEtiapine  25 mg Per Tube QHS   Continuous Infusions: .  ceFAZolin (ANCEF) IV    . lactated ringers 100 mL/hr at 02/18/20  0051  . tranexamic acid     PRN Meds: acetaminophen **OR** acetaminophen, guaiFENesin, HYDROmorphone (DILAUDID) injection **OR** HYDROmorphone (DILAUDID) injection, ipratropium-albuterol, ondansetron **OR** ondansetron (ZOFRAN) IV, polyethylene glycol  Allergies:   No Known Allergies  Social History:   Social History   Socioeconomic History  . Marital status: Widowed    Spouse name: Not on file  . Number of children: 4  . Years of education: Not on file  . Highest education level: Not on file  Occupational History  . Not on file  Tobacco Use  . Smoking status: Former Smoker    Types: Cigarettes    Quit date: 06/20/1989    Years since quitting: 30.6  . Smokeless tobacco: Never Used  Vaping Use  . Vaping Use: Never used  Substance and Sexual Activity  . Alcohol use: No  . Drug use: No  . Sexual activity: Not on file  Other Topics Concern  . Not on file  Social History Narrative  . Not on file   Social Determinants of Health   Financial Resource Strain:   . Difficulty of Paying Living Expenses: Not on file  Food Insecurity:   . Worried About Charity fundraiser in the Last Year: Not on file  . Ran Out of Food in the Last Year: Not on file  Transportation Needs:   . Lack of Transportation (Medical): Not on file  . Lack of Transportation (Non-Medical): Not on file  Physical Activity:   . Days of Exercise per Week: Not on file  . Minutes of Exercise per Session: Not on file  Stress:   . Feeling of Stress : Not on file  Social Connections:   . Frequency of Communication with Friends and Family: Not on file  . Frequency of Social Gatherings with Friends and Family: Not on file  . Attends Religious Services: Not on file  . Active Member of Clubs or Organizations: Not on file  . Attends Archivist Meetings: Not on file  . Marital Status: Not on file  Intimate Partner Violence:   . Fear of Current or Ex-Partner: Not on file  . Emotionally Abused: Not on file    . Physically Abused: Not on file  . Sexually Abused: Not on file    Family History:   Family History  Problem Relation Age of Onset  . Coronary artery disease Mother        AICD  . COPD Father   . Coronary artery disease Brother    Family Status:  Family Status  Relation Name Status  . Mother  Alive  . Father  Deceased  . MGM  Deceased  . MGF  Deceased  .  PGM  Deceased  . PGF  Deceased  . Brother  Alive  . Brother  Alive  . Brother  Alive  . Brother  Alive  . Sister  Alive  . Sister  Alive  . Sister  Alive  . Brother  (Not Specified)    ROS:  Please see the history of present illness.  All other ROS reviewed and negative.     Physical Exam/Data:   Vitals:   02/18/20 0145 02/18/20 0245 02/18/20 0500 02/18/20 0615  BP: 133/87 106/74 128/74 129/74  Pulse: 87 81 85 76  Resp: 16 11 17 11   Temp:      TempSrc:      SpO2: 95% 93% 96% 92%   No intake or output data in the 24 hours ending 02/18/20 0738 There were no vitals filed for this visit. There is no height or weight on file to calculate BMI.   General: Well developed, well nourished, NAD Neck: Negative for carotid bruits. No JVD Lungs:Clear to ausculation bilaterally. No wheezes. Breathing is unlabored. Cardiovascular: RRR with S1 S2. No murmurs Abdomen: Soft, non-tender, non-distended. No obvious abdominal masses. Extremities: No edema.  Radial pulses 2+ bilaterally Neuro: Alert and oriented. No focal deficits. Facial asymmetry secondary to cyst removal. MAE spontaneously. Psych: Responds to questions appropriately with normal affect.    EKG:  The EKG was personally reviewed and demonstrates: 02/18/20 NSR with ventricular pacing at 84bpm Telemetry:  Telemetry was personally reviewed and demonstrates: 02/18/2020 NSR with ventricular pacing  Relevant CV Studies:  Echocardiogram 07/13/11: Study Conclusions  - Left ventricle: Systolic function was normal. Wall motion was normal; there were no regional wall  motion abnormalities. - Aortic valve: No evidence of vegetation. - Mitral valve: No evidence of vegetation. - Left atrium: No evidence of thrombus in the atrial cavity or appendage. - Right atrium: No evidence of thrombus in the atrial cavity or appendage. - Atrial septum: There was a valve-incompetent patent foramen ovale. There was redundancy of the septum, with borderline criteria for aneurysm. - Tricuspid valve: No evidence of vegetation. - Pulmonic valve: No evidence of vegetation.  Laboratory Data:  Chemistry Recent Labs  Lab 02/17/20 2147 02/18/20 0404  NA 139 140  K 3.9 3.8  CL 102 104  CO2 27 27  GLUCOSE 121* 110*  BUN 22 17  CREATININE 0.74 0.65  CALCIUM 8.9 8.6*  GFRNONAA >60 >60  GFRAA >60 >60  ANIONGAP 10 9    Total Protein  Date Value Ref Range Status  02/18/2020 7.1 6.5 - 8.1 g/dL Final   Albumin  Date Value Ref Range Status  02/18/2020 3.8 3.5 - 5.0 g/dL Final   AST  Date Value Ref Range Status  02/18/2020 19 15 - 41 U/L Final   ALT  Date Value Ref Range Status  02/18/2020 28 0 - 44 U/L Final   Alkaline Phosphatase  Date Value Ref Range Status  02/18/2020 71 38 - 126 U/L Final   Total Bilirubin  Date Value Ref Range Status  02/18/2020 0.6 0.3 - 1.2 mg/dL Final   Hematology Recent Labs  Lab 02/17/20 2147 02/18/20 0404  WBC 10.8* 10.9*  RBC 4.36 4.07*  HGB 12.2* 11.6*  HCT 38.5* 36.0*  MCV 88.3 88.5  MCH 28.0 28.5  MCHC 31.7 32.2  RDW 14.4 14.3  PLT 182 174   Cardiac EnzymesNo results for input(s): TROPONINI in the last 168 hours. No results for input(s): TROPIPOC in the last 168 hours.  BNPNo results for input(s):  BNP, PROBNP in the last 168 hours.  DDimer No results for input(s): DDIMER in the last 168 hours. TSH:  Lab Results  Component Value Date   TSH 0.661 10/01/2019   Lipids:No results found for: CHOL, HDL, LDLCALC, LDLDIRECT, TRIG, CHOLHDL HgbA1c:No results found for: HGBA1C  Radiology/Studies:  CUP PACEART  INCLINIC DEVICE CHECK  Result Date: 02/14/2020 PPM at RRT. Normal device function.  Device reprogrammed back to DDD 70 today.  Gen change scheduled.  DG Hip Unilat  With Pelvis 2-3 Views Right  Result Date: 02/17/2020 CLINICAL DATA:  Tripped, right hip pain EXAM: DG HIP (WITH OR WITHOUT PELVIS) 2-3V RIGHT COMPARISON:  None. FINDINGS: Frontal view of the pelvis as well as frontal and frogleg lateral views of the right hip are obtained. There is a comminuted displaced basicervical right femoral neck fracture with mild impaction and valgus angulation at the fracture site. No dislocation. Remainder of the bony pelvis is unremarkable. IMPRESSION: 1. Comminuted mildly displaced right femoral neck fracture. Electronically Signed   By: Randa Ngo M.D.   On: 02/17/2020 21:28   Assessment and Plan:   1. Pre-operative assessment: -Pt presented to [facility] 02/17/20 s/p mechanical fall after tripping on his Foley catheter tubing resulting in right femoral hip fracture>>plan for repair 02/18/2020 however preferred for cardiology input on PPM generator change out prior to surgical intervention. -He had a witnessed VF cardiac arrest with underlying complete heart block (atrial rate in the 90's) after resuscitation at which time he underwent cardiac cath which showed no obstructive CAD with a preserved LVEF>>subsequently he underwent dual chamber PPM placement 07/15/2011  -Pt is pacemaker dependent -The patient does not have any unstable cardiac conditions. He can achieve 4 METs or greater without anginal symptoms. According to [facility] and AHA guidelines, he requires no further cardiac workup prior to his noncardiac surgery.  His revised cardiac risk of peri-procedural MI or cardiac arrest following surgery is low.  -Given history of VF arrest with underlying complete heart block, we will have his device interrogated prior to surgery.  -We will also contact our EP team to assess the need for generator change out while  patient is hospitalized.  -Our service is available as necessary in the perioperative period.  2. Hx of CHB s/p PPM placemnt 2013: -Patient is followed by Dr. Sallyanne Kuster and EP for pacemaker support. He was most recently seen 02/14/20 with no cardiac concern. PPM reprogrammed at that time to DDD 70 RRT with normal device functioning. Plan was to proceed with generator change out next week 9/1 or 9/3>>will need to contact Dr.Crotioru for further recommendations on timing of gent change out  -ED to interrogate device in the meantime   3. Urinary retention: -Has chronic suprapubic catheter placed -Management per primary team  4. Hx of dysphasia: -Has hx of recent aspiration PNA requiring ETT and ultimately trach and PEG tube placement secondary to cystic schwannoma removal for 0630 complicated by cranial nerve injury -Receives bolus feeds per PEG>>>passed swallow study for soft diet however still requires supplemental feeds  -Management per IM  -Currently living at [facility] rehabilitation facility with plans to return home   For questions or updates, please contact [place] Please consult [email or web address] for contact info under Cardiology/STEMI.   SignedKathyrn Drown NP-C [facility] Pager: [phone] 02/18/2020 7:38 AM  History and all data above reviewed.  Patient examined. This is a very nice man who fell and has a right femoral neck fracture needing repair.  He has a history  of CHB and is pacemaker dependent and is in need of elective replacement.  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  He has a great attitude and says he works well with rehab in a rehab facility and can ambulate.  He denies any cardiovascular symptoms.  The patient denies any new symptoms such as chest discomfort, neck or arm discomfort. There has been no new shortness of breath, PND or  orthopnea. There have been no reported palpitations, presyncope or syncope.   I agree with the findings as above.  The patient exam reveals COR:RRR  ,  Lungs: Clear  ,  Abd: Positive bowel sounds, no rebound no guarding, feeding tube in place, Ext No edema  .  All available labs, radiology testing, previous records reviewed. Agree with documented assessment and plan. PREOP:  The patient had a high functional level despite his multiple medical problems. He has no high risk symptoms or findings.  He is not going for a high risk procedure.  According to ACC/AHA guidelines he is at acceptable risk for the planned procedure.  PACEMAKER:  Plan is to replace this at [facility] during this admission in a couple of days.  Trach:  He wants to see about being decannulated possibly during this admission as well.    Jeneen Rinks Germaine Casey Richard  12:10 PM  02/18/2020

## 2020-02-18 NOTE — Anesthesia Procedure Notes (Addendum)
Procedure Name: Intubation Date/Time: 02/18/2020 1:54 PM Performed by: Montel Clock, CRNA Pre-anesthesia Checklist: Patient identified, Emergency Drugs available, Suction available, Patient being monitored and Timeout performed Patient Re-evaluated:Patient Re-evaluated prior to induction Oxygen Delivery Method: Circle system utilized Preoxygenation: Pre-oxygenation with 100% oxygen Induction Type: IV induction Tube type: Reinforced tube 5.5 via trach. Tube size: 5.5 mm Number of attempts: 1 Placement Confirmation: positive ETCO2 and breath sounds checked- equal and bilateral Tube secured with: Tape Dental Injury: Teeth and Oropharynx as per pre-operative assessment  Comments: 4.0 uncuffed shiley trach replaced with 5.5 reinforced ETT cuffed.

## 2020-02-18 NOTE — Consult Note (Signed)
ORTHOPAEDIC CONSULTATION  REQUESTING PHYSICIAN: Antonieta Pert, MD  Chief Complaint: Right hip pain after fall  HPI: Casey Richard is a 64 y.o. 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 He states that he tripped over his catheter tubing yesterday evening and fell onto his right hip and elbow. States pain at right hip has been constant and severe since fall, worse with any movement at right hip and better with rest. Denies any pain at right elbow this morning. Denies hitting his head or loss of consciousness. Denies any paraesthesias.    Past Medical History:  Diagnosis Date  . Cardiac arrest ([place])   . CHB (complete heart block) ([place])   . 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 Dysrhythmia 1/13   bradycardia, VF, cardiac arrest/complete heart block with pacer inserted/LOV Dr Sallyanne Kuster 07/21/11 with interrogation and anesthesia guideline order on chart. Chest x ray 1/13 EPIC,, TEE and [facility]  1/13 EPIC  . Memory loss, short term    from fall with heart block 1/13  . Nonsustained ventricular tachycardia ([place]) 02/07/2014  . Presence of permanent cardiac pacemaker 07/15/2011   Medtronic Revo  . Seasonal allergies   . Shortness of breath    Past Surgical History:  Procedure Laterality Date  . CARDIAC CATHETERIZATION  07/09/2011   Normal coronaries  . CRANIOTOMY Right 10/07/2019   Procedure: RIGHT CRANIOTOMY TUMOR EXCISION;  Surgeon: Judith Part, MD;  Location: [place];  Service: Neurosurgery;  Laterality: Right;  . CRANIOTOMY Right 10/10/2019   Procedure: CRANIOTOMY FOR EVACUATION OF HEMATOMA;  Surgeon: Judith Part, MD;  Location: [place];  Service: Neurosurgery;  Laterality:  Right;  CRANIOTOMY TUMOR EXCISION  . CYSTOSCOPY WITH URETHRAL DILATATION  08/19/2011   Procedure: CYSTOSCOPY WITH URETHRAL DILATATION;  Surgeon: Dutch Gray, MD;  Location: [facility];  Service: Urology;  Laterality: N/A;  Balloon Dilation of Urethral Stricture, retro-urethrogram, Suprapubic change   . ESOPHAGOGASTRODUODENOSCOPY N/A 10/18/2019   Procedure: ESOPHAGOGASTRODUODENOSCOPY (EGD);  Surgeon: Jesusita Oka, MD;  Location: [facility] ENDOSCOPY;  Service: Endoscopy;  Laterality: N/A;  . LEFT HEART CATHETERIZATION WITH CORONARY ANGIOGRAM N/A 07/09/2011   Procedure: LEFT HEART CATHETERIZATION WITH CORONARY ANGIOGRAM;  Surgeon: Jettie Booze, MD;  Location: [facility] CATH LAB;  Service: Cardiovascular;  Laterality: N/A;  . PACEMAKER INSERTION  07/15/11   Medtronic Revo  . PEG PLACEMENT N/A 10/18/2019   Procedure: PERCUTANEOUS ENDOSCOPIC GASTROSTOMY (PEG) PLACEMENT;  Surgeon: Jesusita Oka, MD;  Location: [place] ENDOSCOPY;  Service: Endoscopy;  Laterality: N/A;  . PERMANENT PACEMAKER INSERTION Left 07/15/2011   Procedure: PERMANENT PACEMAKER INSERTION;  Surgeon: Sanda Klein, MD;  Location: [place] CATH LAB;  Service: Cardiovascular;  Laterality: Left;  . TEMPORARY PACEMAKER INSERTION Right 07/09/2011   Procedure: TEMPORARY PACEMAKER INSERTION;  Surgeon: Jettie Booze, MD;  Location: [facility] CATH LAB;  Service: Cardiovascular;  Laterality: Right;   Social History   Socioeconomic History  . Marital status: Widowed    Spouse name: Not on file  . Number of children: 4  . Years of education: Not on file  . Highest education level: Not on file  Occupational History  . Not on file  Tobacco Use  . Smoking status: Former Smoker    Types: Cigarettes  Quit date: 06/20/1989    Years since quitting: 30.6  . Smokeless tobacco: Never Used  Vaping Use  . Vaping Use: Never used  Substance and Sexual Activity  . Alcohol use: No  . Drug use: No  . Sexual activity: Not on file  Other Topics Concern  . Not on file   Social History Narrative  . Not on file   Social Determinants of Health   Financial Resource Strain:   . Difficulty of Paying Living Expenses: Not on file  Food Insecurity:   . Worried About Charity fundraiser in the Last Year: Not on file  . Ran Out of Food in the Last Year: Not on file  Transportation Needs:   . Lack of Transportation (Medical): Not on file  . Lack of Transportation (Non-Medical): Not on file  Physical Activity:   . Days of Exercise per Week: Not on file  . Minutes of Exercise per Session: Not on file  Stress:   . Feeling of Stress : Not on file  Social Connections:   . Frequency of Communication with Friends and Family: Not on file  . Frequency of Social Gatherings with Friends and Family: Not on file  . Attends Religious Services: Not on file  . Active Member of Clubs or Organizations: Not on file  . Attends Archivist Meetings: Not on file  . Marital Status: Not on file   Family History  Problem Relation Age of Onset  . Coronary artery disease Mother        AICD  . COPD Father   . Coronary artery disease Brother    No Known Allergies   Positive ROS: All other systems have been reviewed and were otherwise negative with the exception of those mentioned in the HPI and as above.  Physical Exam: General: Alert, no acute distress Cardiovascular: No pedal edema Respiratory: No cyanosis, no use of accessory musculature GI: No organomegaly, abdomen is soft and non-tender Skin: No lesions noted on right leg, mild ecchymosis at right elbow.  Neurologic: Sensation intact distally Psychiatric: Patient is competent for consent with normal mood and affect Lymphatic: No axillary or cervical lymphadenopathy  MUSCULOSKELETAL: Right leg shortened, pain with internal and external rotation. Knee bent at 90 degrees on bed for comfort. EHL and FHL intact. +PT pulse, + DP pulse. Distal sensation intact.   Assessment/Plan:  Closed right femoral neck  fracture  - per cardiology, no further cardiac workup is needed prior to surgery - pacemaker will be interrogated prior to surgery - we will plan to move forward with right hip hemiarthroplasty at [facility] - continue to keep NPO in preparation for surgery later this afternoon  Ventura Bruns, PA-C

## 2020-02-18 NOTE — Telephone Encounter (Signed)
The patient is currently in the hospital. Gen change is scheduled for 02/20/20

## 2020-02-18 NOTE — Transfer of Care (Signed)
Immediate Anesthesia Transfer of Care Note  Patient: Casey Richard  Procedure(s) Performed: ARTHROPLASTY BIPOLAR HIP (HEMIARTHROPLASTY) (Right Hip)  Patient Location: PACU  Anesthesia Type:General  Level of Consciousness: drowsy and patient cooperative  Airway & Oxygen Therapy: Patient Spontanous Breathing and Patient connected to tracheostomy mask oxygen  Post-op Assessment: Report given to RN and Post -op Vital signs reviewed and stable  Post vital signs: Reviewed and stable  Last Vitals:  Vitals Value Taken Time  BP 147/73 02/18/20 1607  Temp 37.6 C 02/18/20 1607  Pulse 87 02/18/20 1612  Resp 8 02/18/20 1612  SpO2 93 % 02/18/20 1612  Vitals shown include unvalidated device data.  Last Pain:  Vitals:   02/18/20 1243  TempSrc: Oral  PainSc:          Complications: No complications documented.

## 2020-02-18 NOTE — Progress Notes (Signed)
    Personally spoke with Dr. Sallyanne Kuster who will plan to perform the patients PPM generator change out on Thursday 02/20/20 at [facility] at 2pm

## 2020-02-18 NOTE — Consult Note (Addendum)
Cardiology Consultation:   Patient ID: Casey Richard; [id number]; 1956-04-18   Admit date: 02/17/2020 Date of Consult: 02/18/2020  Primary Care Provider: Benito Mccreedy, MD Primary Cardiologist: Dr. Sallyanne Kuster, MD   Patient Profile:   Casey Richard is a 64 y.o. male with a hx of CHB and NSVT s/p MDT (Medtronic Revo) dual chamber PPM placement (2013) followed by EP, CKD with hx of urethral stricture s/p suprapubic catheter placement, hx of aspiration PNA with trach/PEG tube placement who is being seen today for preoperative evaluation prior to right hip fracture repair at the request of Dr. Lupita Leash.   History of Present Illness:   Casey Richard is a 61yoM with a hx as stated above who presented to [facility] 02/17/20 s/p mechanical fall after tripping on his Foley catheter tubing resulting in right femoral hip fracture. Patient was seen by orthopedics with plans for repair 02/18/2020 however preferred for cardiology input on PPM generator change out prior to surgical intervention.  Casey Richard was initially seen by cardiology after a witnessed VF cardiac arrest with underlying complete heart block (atrial rate in the 90's) after resuscitation. Given his arrest, he was taken to the cardiac cath lab which showed no obstructive CAD with a preserved LVEF. He subsequently underwent dual chamber PPM placement 07/15/2011 per Dr. Sallyanne Kuster without complication.   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 The patient does not have any unstable cardiac conditions. He can achieve 4 METs or greater without anginal symptoms. According to [facility] and AHA guidelines, he requires no further cardiac workup prior to his noncardiac surgery.  His revised cardiac risk of peri-procedural MI or cardiac arrest following surgery is low. Given history of VF arrest with underlying complete heart block, we will have  his device interrogated prior to surgery. We will also contact our EP team to assess the need for generator change out while patient is hospitalized. Our service is available as necessary in the perioperative period.  Past Medical History:  Diagnosis Date  . Cardiac arrest ([place])   . CHB (complete heart block) ([place])   . 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 Dysrhythmia 1/13   bradycardia, VF, cardiac arrest/complete heart block with pacer inserted/LOV Dr Sallyanne Kuster 07/21/11 with interrogation and anesthesia guideline order on chart. Chest x ray 1/13 EPIC,, TEE and [facility]  1/13 EPIC  . Memory loss, short term    from fall with heart block 1/13  . Nonsustained ventricular tachycardia ([place]) 02/07/2014  . Presence of permanent cardiac pacemaker 07/15/2011   Medtronic Revo  . Seasonal allergies   . Shortness of breath     Past Surgical History:  Procedure Laterality Date  . CARDIAC CATHETERIZATION  07/09/2011   Normal coronaries  . CRANIOTOMY Right 10/07/2019   Procedure: RIGHT CRANIOTOMY TUMOR EXCISION;  Surgeon: Judith Part, MD;  Location: [place];  Service: Neurosurgery;  Laterality: Right;  . CRANIOTOMY Right 10/10/2019   Procedure: CRANIOTOMY FOR EVACUATION OF HEMATOMA;  Surgeon: Judith Part, MD;  Location: [place];  Service: Neurosurgery;  Laterality: Right;  CRANIOTOMY TUMOR EXCISION  . CYSTOSCOPY WITH URETHRAL DILATATION  08/19/2011   Procedure: CYSTOSCOPY WITH URETHRAL DILATATION;  Surgeon: Dutch Gray, MD;  Location: [facility];  Service: Urology;  Laterality: N/A;  Balloon Dilation of Urethral Stricture, retro-urethrogram, Suprapubic change   . ESOPHAGOGASTRODUODENOSCOPY N/A 10/18/2019  Procedure: ESOPHAGOGASTRODUODENOSCOPY (EGD);  Surgeon: Jesusita Oka, MD;  Location: [facility] ENDOSCOPY;  Service: Endoscopy;  Laterality: N/A;  . LEFT HEART CATHETERIZATION WITH CORONARY ANGIOGRAM N/A 07/09/2011   Procedure: LEFT HEART CATHETERIZATION WITH  CORONARY ANGIOGRAM;  Surgeon: Jettie Booze, MD;  Location: [facility] CATH LAB;  Service: Cardiovascular;  Laterality: N/A;  . PACEMAKER INSERTION  07/15/11   Medtronic Revo  . PEG PLACEMENT N/A 10/18/2019   Procedure: PERCUTANEOUS ENDOSCOPIC GASTROSTOMY (PEG) PLACEMENT;  Surgeon: Jesusita Oka, MD;  Location: [place] ENDOSCOPY;  Service: Endoscopy;  Laterality: N/A;  . PERMANENT PACEMAKER INSERTION Left 07/15/2011   Procedure: PERMANENT PACEMAKER INSERTION;  Surgeon: Sanda Klein, MD;  Location: [facility] CATH LAB;  Service: Cardiovascular;  Laterality: Left;  . TEMPORARY PACEMAKER INSERTION Right 07/09/2011   Procedure: TEMPORARY PACEMAKER INSERTION;  Surgeon: Jettie Booze, MD;  Location: [facility] CATH LAB;  Service: Cardiovascular;  Laterality: Right;     Prior to Admission medications   Medication Sig Start Date End Date Taking? Authorizing Provider  acetaminophen (TYLENOL) 500 MG tablet Take 1,000 mg by mouth every 6 (six) hours as needed. Pain     [provider]  Amino Acids-Protein Hydrolys (FEEDING SUPPLEMENT, PRO-STAT SUGAR FREE 64,) LIQD Place 30 mLs into feeding tube 2 (two) times daily. 11/14/19   Judith Part, MD  amLODipine (NORVASC) 10 MG tablet Place 1 tablet (10 mg total) into feeding tube daily. 11/14/19   Judith Part, MD  artificial tears (LACRILUBE) OINT ophthalmic ointment Place into the right eye every 4 (four) hours. 11/14/19   Judith Part, MD  bethanechol (URECHOLINE) 10 MG tablet Place 1 tablet (10 mg total) into feeding tube 3 (three) times daily. 11/14/19   Judith Part, MD  Cholecalciferol (HM VITAMIN D3) 4000 units CAPS Take 1 capsule by mouth daily.    [provider]  cyanocobalamin 2000 MCG tablet Take 2,000 mcg by mouth daily.    [provider]  docusate (COLACE) 50 MG/5ML liquid Place 10 mLs (100 mg total) into feeding tube daily. 11/14/19   Judith Part, MD  erythromycin ophthalmic ointment SMARTSIG:In Eye(s)  01/15/20   [provider]  furosemide (LASIX) 20 MG tablet Take 20 mg by mouth daily. 09/11/19   [provider]  guaiFENesin (ROBITUSSIN) 100 MG/5ML liquid Take 200 mg by mouth 3 (three) times daily as needed for cough. Taking 30 ml as needed for chest congestion    [provider]  heparin 5000 UNIT/ML injection Inject 1 mL (5,000 Units total) into the skin every 8 (eight) hours. 11/14/19   Judith Part, MD  ipratropium-albuterol (DUONEB) 0.5-2.5 (3) MG/3ML SOLN Take 3 mLs by nebulization every 6 (six) hours as needed. 11/14/19   Judith Part, MD  lisinopril (ZESTRIL) 5 MG tablet Place 1 tablet (5 mg total) into feeding tube daily. 11/14/19   Judith Part, MD  Multiple Vitamin (MULITIVITAMIN WITH MINERALS) TABS Take 1 tablet by mouth daily.     [provider]  Nutritional Supplements (ISOSOURCE 1.5 CAL) LIQD 1,000 mLs by PEG Tube route daily. 11/15/19   Judith Part, MD  pantoprazole sodium (PROTONIX) 40 mg/20 mL PACK Place 20 mLs (40 mg total) into feeding tube daily. 11/14/19   Judith Part, MD  QUEtiapine (SEROQUEL) 25 MG tablet Place 1 tablet (25 mg total) into feeding tube at bedtime. 11/14/19   Judith Part, MD  Water For Irrigation, Sterile (FREE WATER) SOLN Place 200 mLs into feeding tube  every 4 (four) hours. 11/14/19   Judith Part, MD    Inpatient Medications: Scheduled Meds: . amLODipine  10 mg Per Tube Daily  . artificial tears   Right Eye Q4H  . bethanechol  10 mg Per Tube TID  . chlorhexidine  60 mL Topical Once  . docusate  100 mg Per Tube Daily  . feeding supplement (PRO-STAT SUGAR FREE 64)  30 mL Per Tube BID  . furosemide  20 mg Oral Daily  . lisinopril  5 mg Per Tube Daily  . pantoprazole sodium  40 mg Per Tube Daily  . povidone-iodine  2 application Topical Once  . QUEtiapine  25 mg Per Tube QHS   Continuous Infusions: .  ceFAZolin (ANCEF) IV    . lactated ringers 100 mL/hr at 02/18/20  0051  . tranexamic acid     PRN Meds: acetaminophen **OR** acetaminophen, guaiFENesin, HYDROmorphone (DILAUDID) injection **OR** HYDROmorphone (DILAUDID) injection, ipratropium-albuterol, ondansetron **OR** ondansetron (ZOFRAN) IV, polyethylene glycol  Allergies:   No Known Allergies  Social History:   Social History   Socioeconomic History  . Marital status: Widowed    Spouse name: Not on file  . Number of children: 4  . Years of education: Not on file  . Highest education level: Not on file  Occupational History  . Not on file  Tobacco Use  . Smoking status: Former Smoker    Types: Cigarettes    Quit date: 06/20/1989    Years since quitting: 30.6  . Smokeless tobacco: Never Used  Vaping Use  . Vaping Use: Never used  Substance and Sexual Activity  . Alcohol use: No  . Drug use: No  . Sexual activity: Not on file  Other Topics Concern  . Not on file  Social History Narrative  . Not on file   Social Determinants of Health   Financial Resource Strain:   . Difficulty of Paying Living Expenses: Not on file  Food Insecurity:   . Worried About Charity fundraiser in the Last Year: Not on file  . Ran Out of Food in the Last Year: Not on file  Transportation Needs:   . Lack of Transportation (Medical): Not on file  . Lack of Transportation (Non-Medical): Not on file  Physical Activity:   . Days of Exercise per Week: Not on file  . Minutes of Exercise per Session: Not on file  Stress:   . Feeling of Stress : Not on file  Social Connections:   . Frequency of Communication with Friends and Family: Not on file  . Frequency of Social Gatherings with Friends and Family: Not on file  . Attends Religious Services: Not on file  . Active Member of Clubs or Organizations: Not on file  . Attends Archivist Meetings: Not on file  . Marital Status: Not on file  Intimate Partner Violence:   . Fear of Current or Ex-Partner: Not on file  . Emotionally Abused: Not on file   . Physically Abused: Not on file  . Sexually Abused: Not on file    Family History:   Family History  Problem Relation Age of Onset  . Coronary artery disease Mother        AICD  . COPD Father   . Coronary artery disease Brother    Family Status:  Family Status  Relation Name Status  . Mother  Alive  . Father  Deceased  . MGM  Deceased  . MGF  Deceased  .  PGM  Deceased  . PGF  Deceased  . Brother  Alive  . Brother  Alive  . Brother  Alive  . Brother  Alive  . Sister  Alive  . Sister  Alive  . Sister  Alive  . Brother  (Not Specified)    ROS:  Please see the history of present illness.  All other ROS reviewed and negative.     Physical Exam/Data:   Vitals:   02/18/20 0145 02/18/20 0245 02/18/20 0500 02/18/20 0615  BP: 133/87 106/74 128/74 129/74  Pulse: 87 81 85 76  Resp: 16 11 17 11   Temp:      TempSrc:      SpO2: 95% 93% 96% 92%   No intake or output data in the 24 hours ending 02/18/20 0738 There were no vitals filed for this visit. There is no height or weight on file to calculate BMI.   General: Well developed, well nourished, NAD Neck: Negative for carotid bruits. No JVD Lungs:Clear to ausculation bilaterally. No wheezes. Breathing is unlabored. Cardiovascular: RRR with S1 S2. No murmurs Abdomen: Soft, non-tender, non-distended. No obvious abdominal masses. Extremities: No edema.  Radial pulses 2+ bilaterally Neuro: Alert and oriented. No focal deficits. Facial asymmetry secondary to cyst removal. MAE spontaneously. Psych: Responds to questions appropriately with normal affect.    EKG:  The EKG was personally reviewed and demonstrates: 02/18/20 NSR with ventricular pacing at 84bpm Telemetry:  Telemetry was personally reviewed and demonstrates: 02/18/2020 NSR with ventricular pacing  Relevant CV Studies:  Echocardiogram 07/13/11: Study Conclusions  - Left ventricle: Systolic function was normal. Wall motion was normal; there were no regional wall  motion abnormalities. - Aortic valve: No evidence of vegetation. - Mitral valve: No evidence of vegetation. - Left atrium: No evidence of thrombus in the atrial cavity or appendage. - Right atrium: No evidence of thrombus in the atrial cavity or appendage. - Atrial septum: There was a valve-incompetent patent foramen ovale. There was redundancy of the septum, with borderline criteria for aneurysm. - Tricuspid valve: No evidence of vegetation. - Pulmonic valve: No evidence of vegetation.  Laboratory Data:  Chemistry Recent Labs  Lab 02/17/20 2147 02/18/20 0404  NA 139 140  K 3.9 3.8  CL 102 104  CO2 27 27  GLUCOSE 121* 110*  BUN 22 17  CREATININE 0.74 0.65  CALCIUM 8.9 8.6*  GFRNONAA >60 >60  GFRAA >60 >60  ANIONGAP 10 9    Total Protein  Date Value Ref Range Status  02/18/2020 7.1 6.5 - 8.1 g/dL Final   Albumin  Date Value Ref Range Status  02/18/2020 3.8 3.5 - 5.0 g/dL Final   AST  Date Value Ref Range Status  02/18/2020 19 15 - 41 U/L Final   ALT  Date Value Ref Range Status  02/18/2020 28 0 - 44 U/L Final   Alkaline Phosphatase  Date Value Ref Range Status  02/18/2020 71 38 - 126 U/L Final   Total Bilirubin  Date Value Ref Range Status  02/18/2020 0.6 0.3 - 1.2 mg/dL Final   Hematology Recent Labs  Lab 02/17/20 2147 02/18/20 0404  WBC 10.8* 10.9*  RBC 4.36 4.07*  HGB 12.2* 11.6*  HCT 38.5* 36.0*  MCV 88.3 88.5  MCH 28.0 28.5  MCHC 31.7 32.2  RDW 14.4 14.3  PLT 182 174   Cardiac EnzymesNo results for input(s): TROPONINI in the last 168 hours. No results for input(s): TROPIPOC in the last 168 hours.  BNPNo results for input(s):  BNP, PROBNP in the last 168 hours.  DDimer No results for input(s): DDIMER in the last 168 hours. TSH:  Lab Results  Component Value Date   TSH 0.661 10/01/2019   Lipids:No results found for: CHOL, HDL, LDLCALC, LDLDIRECT, TRIG, CHOLHDL HgbA1c:No results found for: HGBA1C  Radiology/Studies:  CUP PACEART  INCLINIC DEVICE CHECK  Result Date: 02/14/2020 PPM at RRT. Normal device function.  Device reprogrammed back to DDD 70 today.  Gen change scheduled.  DG Hip Unilat  With Pelvis 2-3 Views Right  Result Date: 02/17/2020 CLINICAL DATA:  Tripped, right hip pain EXAM: DG HIP (WITH OR WITHOUT PELVIS) 2-3V RIGHT COMPARISON:  None. FINDINGS: Frontal view of the pelvis as well as frontal and frogleg lateral views of the right hip are obtained. There is a comminuted displaced basicervical right femoral neck fracture with mild impaction and valgus angulation at the fracture site. No dislocation. Remainder of the bony pelvis is unremarkable. IMPRESSION: 1. Comminuted mildly displaced right femoral neck fracture. Electronically Signed   By: Randa Ngo M.D.   On: 02/17/2020 21:28   Assessment and Plan:   1. Pre-operative assessment: -Pt presented to [facility] 02/17/20 s/p mechanical fall after tripping on his Foley catheter tubing resulting in right femoral hip fracture>>plan for repair 02/18/2020 however preferred for cardiology input on PPM generator change out prior to surgical intervention. -He had a witnessed VF cardiac arrest with underlying complete heart block (atrial rate in the 90's) after resuscitation at which time he underwent cardiac cath which showed no obstructive CAD with a preserved LVEF>>subsequently he underwent dual chamber PPM placement 07/15/2011  -Pt is pacemaker dependent -The patient does not have any unstable cardiac conditions. He can achieve 4 METs or greater without anginal symptoms. According to [facility] and AHA guidelines, he requires no further cardiac workup prior to his noncardiac surgery.  His revised cardiac risk of peri-procedural MI or cardiac arrest following surgery is low.  -Given history of VF arrest with underlying complete heart block, we will have his device interrogated prior to surgery.  -We will also contact our EP team to assess the need for generator change out while  patient is hospitalized.  -Our service is available as necessary in the perioperative period.  2. Hx of CHB s/p PPM placemnt 2013: -Patient is followed by Dr. Sallyanne Kuster and EP for pacemaker support. He was most recently seen 02/14/20 with no cardiac concern. PPM reprogrammed at that time to DDD 70 RRT with normal device functioning. Plan was to proceed with generator change out next week 9/1 or 9/3>>will need to contact Dr.Crotioru for further recommendations on timing of gent change out  -ED to interrogate device in the meantime   3. Urinary retention: -Has chronic suprapubic catheter placed -Management per primary team  4. Hx of dysphasia: -Has hx of recent aspiration PNA requiring ETT and ultimately trach and PEG tube placement secondary to cystic schwannoma removal for 9379 complicated by cranial nerve injury -Receives bolus feeds per PEG>>>passed swallow study for soft diet however still requires supplemental feeds  -Management per IM  -Currently living at [facility] rehabilitation facility with plans to return home   For questions or updates, please contact [place] Please consult [email or web address] for contact info under Cardiology/STEMI.   SignedKathyrn Drown NP-C [facility] Pager: [phone] 02/18/2020 7:38 AM  History and all data above reviewed.  Patient examined. This is a very nice man who fell and has a right femoral neck fracture needing repair.  He has a history  of CHB and is pacemaker dependent and is in need of elective replacement.  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  He has a great attitude and says he works well with rehab in a rehab facility and can ambulate.  He denies any cardiovascular symptoms.  The patient denies any new symptoms such as chest discomfort, neck or arm discomfort. There has been no new shortness of breath, PND or  orthopnea. There have been no reported palpitations, presyncope or syncope.   I agree with the findings as above.  The patient exam reveals COR:RRR  ,  Lungs: Clear  ,  Abd: Positive bowel sounds, no rebound no guarding, feeding tube in place, Ext No edema  .  All available labs, radiology testing, previous records reviewed. Agree with documented assessment and plan. PREOP:  The patient had a high functional level despite his multiple medical problems. He has no high risk symptoms or findings.  He is not going for a high risk procedure.  According to ACC/AHA guidelines he is at acceptable risk for the planned procedure.  PACEMAKER:  Plan is to replace this at [facility] during this admission in a couple of days.  Trach:  He wants to see about being decannulated possibly during this admission as well.    Casey Richard  12:10 PM  02/18/2020

## 2020-02-18 NOTE — TOC Progression Note (Signed)
Transition of Care [facility]) - Progression Note    Patient Details  Name: Casey Richard MRN: [id number] Date of Birth: 1956/05/12  Transition of Care [facility]) CM/SW Contact  Stanislaus Kaltenbach C Tarpley-Carter, LCSWA Phone Number: 02/18/2020, 11:13 AM  Clinical Narrative:    [facility] CSW consult request has been received. CSW attempting to follow up at present time.  Pt is scheduled for surgery later today.  Please reconsult social work when pt is closer to [employer], CSW signing off at this time.  Orra Nolde Tarpley-Carter, MSW, [place] ED Transitions of CareClinical Social Worker [email or web address] [phone]           Expected Discharge Plan and Services                                                 Social Determinants of Health (SDOH) Interventions    Readmission Risk Interventions No flowsheet data found.

## 2020-02-18 NOTE — Progress Notes (Signed)
Patient seen and examined personally, I reviewed the chart, history and physical and admission note, done by admitting physician this morning and agree with the same with following addendum.  Please refer to the morning admission note for more detailed plan of care.  Briefly, 64 year old male with history of V. fib arrest/complete heart block with pacemaker in place since 2013, urethral stricture with suprapubic catheter, right cystic schwannoma surgery in April 1655 postop complicated by cranial nerve injury resulting in aspiration pneumonia requiring mechanical ventilation, tracheostomy placement and PEG tube placement presented to ED status post fall in the evening 8/30, in the ED x-ray showed right femoral neck fracture, Dr. Danny Lawless from orthopedic was consulted, and patient is being admitted to [facility] for further EP cardiology evaluation prior to undergoing operative intervention. Seen this  Alert awake oriented x3, resting comfortably.  Tracheostomy in place but close to the valve to help him speak.  Reports he is eating diet at home and history of her thin liquid, also on bolus tube feed, has chronic Foley catheter in place.  Denies any chest pain nausea vomiting fever or chills.  Issues:  Right right femoral neck fracture status post fall, orthopedics has seen and cardiology on board for preop eval. He is admitted to [facility] surgery and will go for surgery of hip she is cleared by cardiology.  I have discussed with the cardiologist this morning who are seeing the patient in [place].   History of complete heart block/V. fib arrest/status post pacemaker-he was scheduled to undergo generator change change next week on outpatient setting and EP has been called and I spoke w/ cardio NP and is seeing patient this am.   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

## 2020-02-18 NOTE — H&P (Addendum)
History and Physical    Casey Richard:[id number] DOB: 1955/10/29 DOA: 02/17/2020  PCP: Benito Mccreedy, MD  Patient coming from:      Chief Complaint:  Chief Complaint  Patient presents with  . Hip Pain     HPI:    64 year old male with past medical history of V. 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  Patient explains that earlier in the evening on 8/30 patient tripped on his Foley catheter tubing, landing on his right hip.  Immediately after the fall the patient felt severe right hip pain, sharp in quality, nonradiating and severe in intensity.  Pain is worse with any movement whatsoever without alleviating factors.  Pain was ongoing for several hours and so the patient was eventually brought to [facility] emergency department for evaluation.  Upon evaluation in the emergency department x-ray revealed right femoral neck fracture.  Case was discussed with Dr. Mardelle Matte who stated that he may be able to take the patient to the OR on 8/31.  Due to the patient's complicated medical history the hospitalist group has been called to assess the patient for mission of the hospital.  Review of Systems:   Review of Systems  Musculoskeletal: Positive for falls.       Right hip pain  All other systems reviewed and are negative.     Past Medical History:  Diagnosis Date  . Cardiac arrest ([place])   . CHB (complete heart block) ([place])   . 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 Dysrhythmia 1/13   bradycardia, VF, cardiac arrest/complete heart block with  pacer inserted/LOV Dr Sallyanne Kuster 07/21/11 with interrogation and anesthesia guideline order on chart. Chest x ray 1/13 EPIC,, TEE and [facility]  1/13 EPIC  . Memory loss, short term    from fall with heart block 1/13  . Nonsustained ventricular tachycardia ([place]) 02/07/2014  . Presence of permanent cardiac pacemaker 07/15/2011   Medtronic Revo  . Seasonal allergies   . Shortness of breath     Past Surgical History:  Procedure Laterality Date  . CARDIAC CATHETERIZATION  07/09/2011   Normal coronaries  . CRANIOTOMY Right 10/07/2019   Procedure: RIGHT CRANIOTOMY TUMOR EXCISION;  Surgeon: Judith Part, MD;  Location: [place];  Service: Neurosurgery;  Laterality: Right;  . CRANIOTOMY Right 10/10/2019   Procedure: CRANIOTOMY FOR EVACUATION OF HEMATOMA;  Surgeon: Judith Part, MD;  Location: [place];  Service: Neurosurgery;  Laterality: Right;  CRANIOTOMY TUMOR EXCISION  . CYSTOSCOPY WITH URETHRAL DILATATION  08/19/2011   Procedure: CYSTOSCOPY WITH URETHRAL DILATATION;  Surgeon: Dutch Gray, MD;  Location: [facility];  Service: Urology;  Laterality: N/A;  Balloon Dilation of Urethral Stricture, retro-urethrogram, Suprapubic change   . ESOPHAGOGASTRODUODENOSCOPY N/A 10/18/2019   Procedure: ESOPHAGOGASTRODUODENOSCOPY (EGD);  Surgeon: Jesusita Oka, MD;  Location: [facility] ENDOSCOPY;  Service: Endoscopy;  Laterality: N/A;  . LEFT HEART CATHETERIZATION WITH CORONARY ANGIOGRAM N/A 07/09/2011   Procedure: LEFT HEART CATHETERIZATION WITH CORONARY ANGIOGRAM;  Surgeon: Jettie Booze, MD;  Location: [facility] CATH LAB;  Service: Cardiovascular;  Laterality: N/A;  . PACEMAKER INSERTION  07/15/11   Medtronic Revo  . PEG PLACEMENT N/A  10/18/2019   Procedure: PERCUTANEOUS ENDOSCOPIC GASTROSTOMY (PEG) PLACEMENT;  Surgeon: Jesusita Oka, MD;  Location: [place] ENDOSCOPY;  Service: Endoscopy;  Laterality: N/A;  . PERMANENT PACEMAKER INSERTION Left 07/15/2011   Procedure: PERMANENT PACEMAKER INSERTION;  Surgeon: Sanda Klein, MD;   Location: [place] CATH LAB;  Service: Cardiovascular;  Laterality: Left;  . TEMPORARY PACEMAKER INSERTION Right 07/09/2011   Procedure: TEMPORARY PACEMAKER INSERTION;  Surgeon: Jettie Booze, MD;  Location: [facility] CATH LAB;  Service: Cardiovascular;  Laterality: Right;     reports that he quit smoking about 30 years ago. His smoking use included cigarettes. He has never used smokeless tobacco. He reports that he does not drink alcohol and does not use drugs.  No Known Allergies  Family History  Problem Relation Age of Onset  . Coronary artery disease Mother        AICD  . COPD Father   . Coronary artery disease Brother      Prior to Admission medications   Medication Sig Start Date End Date Taking? Authorizing Provider  acetaminophen (TYLENOL) 500 MG tablet Take 1,000 mg by mouth every 6 (six) hours as needed. Pain     [provider]  Amino Acids-Protein Hydrolys (FEEDING SUPPLEMENT, PRO-STAT SUGAR FREE 64,) LIQD Place 30 mLs into feeding tube 2 (two) times daily. 11/14/19   Judith Part, MD  amLODipine (NORVASC) 10 MG tablet Place 1 tablet (10 mg total) into feeding tube daily. 11/14/19   Judith Part, MD  artificial tears (LACRILUBE) OINT ophthalmic ointment Place into the right eye every 4 (four) hours. 11/14/19   Judith Part, MD  bethanechol (URECHOLINE) 10 MG tablet Place 1 tablet (10 mg total) into feeding tube 3 (three) times daily. 11/14/19   Judith Part, MD  Cholecalciferol (HM VITAMIN D3) 4000 units CAPS Take 1 capsule by mouth daily.    [provider]  cyanocobalamin 2000 MCG tablet Take 2,000 mcg by mouth daily.    [provider]  docusate (COLACE) 50 MG/5ML liquid Place 10 mLs (100 mg total) into feeding tube daily. 11/14/19   Judith Part, MD  erythromycin ophthalmic ointment SMARTSIG:In Eye(s) 01/15/20   [provider]  furosemide (LASIX) 20 MG tablet Take 20 mg by mouth daily. 09/11/19   [provider]  guaiFENesin (ROBITUSSIN) 100 MG/5ML liquid Take 200 mg by mouth 3 (three) times daily as needed for cough. Taking 30 ml as needed for chest congestion    [provider]  heparin 5000 UNIT/ML injection Inject 1 mL (5,000 Units total) into the skin every 8 (eight) hours. 11/14/19   Judith Part, MD  ipratropium-albuterol (DUONEB) 0.5-2.5 (3) MG/3ML SOLN Take 3 mLs by nebulization every 6 (six) hours as needed. 11/14/19   Judith Part, MD  lisinopril (ZESTRIL) 5 MG tablet Place 1 tablet (5 mg total) into feeding tube daily. 11/14/19   Judith Part, MD  Multiple Vitamin (MULITIVITAMIN WITH MINERALS) TABS Take 1 tablet by mouth daily.     [provider]  Nutritional Supplements (ISOSOURCE 1.5 CAL) LIQD 1,000 mLs by PEG Tube route daily. 11/15/19   Judith Part, MD  pantoprazole sodium (PROTONIX) 40 mg/20 mL PACK Place 20 mLs (40 mg total) into feeding tube daily. 11/14/19   Judith Part, MD  QUEtiapine (SEROQUEL) 25 MG tablet Place 1 tablet (25 mg total) into feeding tube at bedtime. 11/14/19   Judith Part, MD  Water For Irrigation, Sterile (FREE WATER) SOLN  Place 200 mLs into feeding tube every 4 (four) hours. 11/14/19   Judith Part, MD    Physical Exam: Vitals:   02/17/20 2034 02/17/20 2240 02/17/20 2245 02/17/20 2330  BP: 133/76 137/87 (!) 146/86 (!) 149/94  Pulse: 82 84 86 85  Resp: 16 17  19   Temp: 98.5 F (36.9 C)     TempSrc: Oral     SpO2: 98% 98% 94% 98%    Constitutional: Acute alert and oriented x3, patient is in distress due to right hip pain. Skin: no rashes, no lesions, good skin turgor noted. Eyes: Pupils are equally reactive to light.  No evidence of scleral icterus or conjunctival pallor.  ENMT: Moist mucous membranes noted.  Posterior pharynx clear of any exudate or lesions.   Neck: Tracheostomy in place.  Normal, supple, no masses, no thyromegaly.  No evidence of jugular venous distension.    Respiratory: clear to auscultation bilaterally, no wheezing, no crackles. Normal respiratory effort. No accessory muscle use.  Cardiovascular: Regular rate and rhythm, no murmurs / rubs / gallops. No extremity edema. 2+ pedal pulses. No carotid bruits.  Chest:   Nontender without crepitus or deformity.   Back:   Nontender without crepitus or deformity. Abdomen: PEG tube in place.  Abdomen is soft and nontender.  No evidence of intra-abdominal masses.  Positive bowel sounds noted in all quadrants.   GU: Suprapubic catheter in place draining clear yellow urine. Musculoskeletal: Exquisite pain with both passive and active range of motion of the right hip.  No other joint deformities or tenderness noted otherwise.  No contractures. Normal muscle tone.  Neurologic: CN 2-12 grossly intact. Sensation intact, strength limited of the right lower extremity due to pain.  Otherwise, patient able to move all other extremities spontaneously.  Patient is following all commands.  Patient is responsive to verbal stimuli.   Psychiatric: Patient presents as a normal mood with appropriate affect.  Patient seems to possess insight as to theircurrent situation.     Labs on Admission: I have personally reviewed following labs and imaging studies -   CBC: Recent Labs  Lab 02/17/20 2147  WBC 10.8*  HGB 12.2*  HCT 38.5*  MCV 88.3  PLT 852   Basic Metabolic Panel: Recent Labs  Lab 02/17/20 2147  NA 139  K 3.9  CL 102  CO2 27  GLUCOSE 121*  BUN 22  CREATININE 0.74  CALCIUM 8.9   GFR: Estimated Creatinine Clearance: 94 mL/min (by C-G formula based on SCr of 0.74 mg/dL). Liver Function Tests: Recent Labs  Lab 02/17/20 2147  AST 20  ALT 30  ALKPHOS 77  BILITOT 0.7  PROT 7.7  ALBUMIN 4.2   No results for input(s): LIPASE, AMYLASE in the last 168 hours. No results for input(s): AMMONIA in the last 168 hours. Coagulation Profile: No results for input(s): INR, PROTIME in the last 168  hours. Cardiac Enzymes: No results for input(s): CKTOTAL, CKMB, CKMBINDEX, TROPONINI in the last 168 hours. BNP (last 3 results) No results for input(s): PROBNP in the last 8760 hours. HbA1C: No results for input(s): HGBA1C in the last 72 hours. CBG: No results for input(s): GLUCAP in the last 168 hours. Lipid Profile: No results for input(s): CHOL, HDL, LDLCALC, TRIG, CHOLHDL, LDLDIRECT in the last 72 hours. Thyroid Function Tests: No results for input(s): TSH, T4TOTAL, FREET4, T3FREE, THYROIDAB in the last 72 hours. Anemia Panel: No results for input(s): VITAMINB12, FOLATE, FERRITIN, TIBC, IRON, RETICCTPCT in the last 72 hours. Urine  analysis:    Component Value Date/Time   COLORURINE AMBER (A) 11/01/2019 1837   APPEARANCEUR TURBID (A) 11/01/2019 1837   LABSPEC 1.023 11/01/2019 1837   PHURINE 5.0 11/01/2019 1837   GLUCOSEU NEGATIVE 11/01/2019 1837   HGBUR MODERATE (A) 11/01/2019 1837   BILIRUBINUR SMALL (A) 11/01/2019 [place] 11/01/2019 1837   PROTEINUR 30 (A) 11/01/2019 1837   UROBILINOGEN 1.0 07/09/2011 2228   NITRITE NEGATIVE 11/01/2019 1837   LEUKOCYTESUR LARGE (A) 11/01/2019 1837    Radiological Exams on Admission - Personally Reviewed: DG Hip Unilat  With Pelvis 2-3 Views Right  Result Date: 02/17/2020 CLINICAL DATA:  Tripped, right hip pain EXAM: DG HIP (WITH OR WITHOUT PELVIS) 2-3V RIGHT COMPARISON:  None. FINDINGS: Frontal view of the pelvis as well as frontal and frogleg lateral views of the right hip are obtained. There is a comminuted displaced basicervical right femoral neck fracture with mild impaction and valgus angulation at the fracture site. No dislocation. Remainder of the bony pelvis is unremarkable. IMPRESSION: 1. Comminuted mildly displaced right femoral neck fracture. Electronically Signed   By: Randa Ngo M.D.   On: 02/17/2020 21:28   ECG: Ventricularly paced rhythm, 84 bpm.  Personally reviewed.   Assessment/Plan Principal  Problem:   Closed displaced fracture of right femoral neck (HCC)   Right femoral neck fracture confirmed on x-ray status post fall  Nature of the fall is likely mechanical and not related to an underlying medical syndrome.  Case is already been evaluated by Dr. Mardelle Matte who is willing to take patient to the OR on 8/31 assuming patient's other medical issues are optimized  From my standpoint, while patient has known history of complete heart block and remote history of V. fib arrest he currently is clinically stable and was seen in his electrophysiologist's office on 8/27  However, patient's pacemaker is due for a generator change scheduled for next week.  Will admit to [facility] as opposed to [facility] long and get electrophysiology input in the morning as to whether they wish to proceed with generator change prior to proceeding with hip replacement.  Placing patient on intravenous fluids for now  N.p.o. after midnight  As needed opiate-based analgesics for substantial pain  Active Problems:   Complete heart block [facility])   Patient follows with Dr. Montel Culver with cardiology  Pacemaker placed in 2013 after patient presented with V. fib arrest and heart block.  Patient is scheduled to undergo generator change next week in the outpatient setting however now with right hip fracture electrophysiology may opt to do the generator change prior to having the patient go for hip replacement.  Will obtain electrophysiology consultation in the morning.    Dysphagia   Developed with significant aspiration risk since removal of right schwannoma in April 2021  Patient to receive Isosource 1.5 bolus feeds 4 times daily  We will hold tube feeds for now, obtain nutrition consultation in the morning for management of feeds    Anastomotic stricture of urinary tract   Status post suprapubic catheter placement, will maintain per protocol    Essential hypertension   Continue home regimen of  amlodipine  Gastroesophageal reflux disease without esophagitis   Continue home regimen of Protonix    Code Status:  Full code Family Communication: deferred   Status is: Inpatient  Remains inpatient appropriate because:IV treatments appropriate due to intensity of illness or inability to take PO and Inpatient level of care appropriate due to severity of illness  Dispo: The patient is from: Home              Anticipated d/c is to: Home              Anticipated d/c date is: 3 days              Patient currently is not medically stable to d/c.        Vernelle Emerald MD [facility] Pager [phone]  If 7PM-7AM, please contact night-coverage [email or web address] Use universal  password for that web site. If you do not have the password, please call the hospital operator.  02/18/2020, 12:24 AM

## 2020-02-18 NOTE — Progress Notes (Signed)
The risks benefits and alternatives were discussed with the patient including but not limited to the risks of nonoperative treatment, versus surgical intervention including infection, bleeding, nerve injury, periprosthetic fracture, the need for revision surgery, dislocation, leg length discrepancy, blood clots, cardiopulmonary complications, morbidity, mortality, among others, and they were willing to proceed.    The patient has been re-examined, and the chart reviewed, and there have been no interval changes to the documented history and physical.    Johnny Bridge, MD

## 2020-02-18 NOTE — ED Notes (Signed)
Delay in giving pt his 10 am meds. Pharmacy reports that they cannot verify meds until they receive the [facility] from [facility].

## 2020-02-19 ENCOUNTER — Encounter (HOSPITAL_COMMUNITY): Payer: Self-pay | Admitting: Orthopedic Surgery

## 2020-02-19 LAB — CBC
HCT: 26.6 % — ABNORMAL LOW (ref 39.0–52.0)
Hemoglobin: 8.4 g/dL — ABNORMAL LOW (ref 13.0–17.0)
MCH: 28.5 pg (ref 26.0–34.0)
MCHC: 31.6 g/dL (ref 30.0–36.0)
MCV: 90.2 fL (ref 80.0–100.0)
Platelets: 144 10*3/uL — ABNORMAL LOW (ref 150–400)
RBC: 2.95 MIL/uL — ABNORMAL LOW (ref 4.22–5.81)
RDW: 14.6 % (ref 11.5–15.5)
WBC: 12.2 10*3/uL — ABNORMAL HIGH (ref 4.0–10.5)
nRBC: 0 % (ref 0.0–0.2)

## 2020-02-19 LAB — HEMOGLOBIN AND HEMATOCRIT, BLOOD
HCT: 24.3 % — ABNORMAL LOW (ref 39.0–52.0)
HCT: 27 % — ABNORMAL LOW (ref 39.0–52.0)
Hemoglobin: 7.6 g/dL — ABNORMAL LOW (ref 13.0–17.0)
Hemoglobin: 8.6 g/dL — ABNORMAL LOW (ref 13.0–17.0)

## 2020-02-19 LAB — BASIC METABOLIC PANEL
Anion gap: 7 (ref 5–15)
BUN: 17 mg/dL (ref 8–23)
CO2: 25 mmol/L (ref 22–32)
Calcium: 8.1 mg/dL — ABNORMAL LOW (ref 8.9–10.3)
Chloride: 105 mmol/L (ref 98–111)
Creatinine, Ser: 0.81 mg/dL (ref 0.61–1.24)
GFR calc Af Amer: >60 mL/min (ref >60)
GFR calc non Af Amer: >60 mL/min (ref >60)
Glucose, Bld: 161 mg/dL — ABNORMAL HIGH (ref 70–99)
Potassium: 4.3 mmol/L (ref 3.5–5.1)
Sodium: 137 mmol/L (ref 135–145)

## 2020-02-19 LAB — MRSA PCR SCREENING: MRSA by PCR: NEGATIVE

## 2020-02-19 LAB — GLUCOSE, CAPILLARY
Glucose-Capillary: 109 mg/dL — ABNORMAL HIGH (ref 70–99)
Glucose-Capillary: 110 mg/dL — ABNORMAL HIGH (ref 70–99)
Glucose-Capillary: 119 mg/dL — ABNORMAL HIGH (ref 70–99)
Glucose-Capillary: 125 mg/dL — ABNORMAL HIGH (ref 70–99)
Glucose-Capillary: 135 mg/dL — ABNORMAL HIGH (ref 70–99)
Glucose-Capillary: 137 mg/dL — ABNORMAL HIGH (ref 70–99)

## 2020-02-19 LAB — PREPARE RBC (CROSSMATCH)

## 2020-02-19 MED ORDER — SODIUM CHLORIDE 0.9 % IV BOLUS
1000.0000 mL | Freq: Once | INTRAVENOUS | Status: AC
  Administered 2020-02-19: 1000 mL via INTRAVENOUS

## 2020-02-19 MED ORDER — ENSURE ENLIVE PO LIQD
237.0000 mL | ORAL | Status: DC
  Administered 2020-02-21 – 2020-02-23 (×3): 237 mL via ORAL

## 2020-02-19 MED ORDER — SODIUM CHLORIDE 0.9% IV SOLUTION: Freq: Once | INTRAVENOUS | Status: AC

## 2020-02-19 MED ORDER — OSMOLITE 1.5 CAL PO LIQD
355.0000 mL | Freq: Four times a day (QID) | ORAL | Status: DC
  Administered 2020-02-19 – 2020-02-22 (×10): 355 mL
  Administered 2020-02-22: 45 mL
  Administered 2020-02-22 – 2020-02-25 (×9): 355 mL
  Filled 2020-02-19 (×27): qty 474

## 2020-02-19 NOTE — Progress Notes (Signed)
PT denies need for suctioning at this time. RT asked unit secretary to order bedside pulse ox. PT denies ATC at this time- discussed importance of humidity.

## 2020-02-19 NOTE — Progress Notes (Signed)
PT denies need for suctioning at this time. 

## 2020-02-19 NOTE — Progress Notes (Signed)
PROGRESS NOTE    Casey Richard  DPO:[id number] DOB: 1956/03/04 DOA: 02/17/2020 PCP: Benito Mccreedy, MD   Chief Complaint  Patient presents with  . Hip Pain    Brief Narrative: 64 year old male with history of V. fib arrest/complete heart block with pacemaker in place since 2013, urethral stricture with suprapubic catheter, right cystic schwannoma surgery in April 4315 postop complicated by cranial nerve injury resulting in aspiration pneumonia requiring mechanical ventilation, tracheostomy placement and PEG tube placement presented to ED status post fall in the evening 8/30, in the ED x-ray showed right femoral neck fracture, Dr. Danny Lawless from orthopedic was consulted, and patient is being admitted to [facility] for further EP cardiology evaluation prior to undergoing operative intervention.   Subjective:  Hypotensive this am- but asymptomatic. Urine in folye is dark, did not eat well yesterday. no dizziness or chest pain or fever or abdomen pain Hb down from 11.6> 8.4 gm Bolus 1 liter ordered    Assessment & Plan:  Right right femoral neck fracture status post fall, s/p right hip hemiarthroplasty  8/31, appreciate orthopedic inputs.  Continue WBAT RLE, reinforce dressing as needed, Lovenox 40 mg daily x30 days for DVT prophylaxis, pain control and 2 weeks follow-up with Dr. Mardelle Matte. PT eval on hold today due to hypotension  Hypotension, suspect hypovolemia, also acute blood loss anemia peri operative .  Blood pressure improved to liter bolus also transfusing 1 unit PRBC.    Acute blood loss anemia, perioperative: Estimated blood loss 300 ml in surgery.  H&H trended down to 7.6 g on admission 12.2 g.  Check H&H posttransfusion. Recent Labs  Lab 02/17/20 2147 02/18/20 0404 02/19/20 0526 02/19/20 1026  HGB 12.2* 11.6* 8.4* 7.6*  HCT 38.5* 36.0* 26.6* 24.3*   History of complete heart block/V. fib arrest/status post pacemaker-he was scheduled to undergo generator change change  next week on outpatient setting and EP has seen the patient planning for battery change tomorrow at [facility] and bring back to [place] [facility] since there is no bed at [facility].   Dysphagia/risk of aspiration resulting from cranial nerve injury following schwannoma surgery in 2021,  is on bolus PEG tube feeding at home and also takes by mouth.    S/p Trach-able to speak with valve on, RT following.  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  Keep the Foley in place appears dark in color likely from hypovolemia dehydration  Essential hypertension-holding home medication due to hypotension.    GERD- keep on PPI.  DVT prophylaxis: enoxaparin (LOVENOX) injection 40 mg Start: 02/19/20 0800 SCDs Start: 02/18/20 2008 SCDs Start: 02/18/20 0020 Code Status:   Code Status: Full Code  Family Communication: plan of care discussed with patient at bedside. Status is: Inpatient  Remains inpatient appropriate because:Ongoing diagnostic testing needed not appropriate for outpatient work up, IV treatments appropriate due to intensity of illness or inability to take PO and Inpatient level of care appropriate due to severity of illness  Dispo: The patient is from: SNF              Anticipated d/c is to: SNF              Anticipated d/c date is: 2 days              Patient currently is not medically stable to d/c.  Nutrition: Diet Order            Diet full liquid Room service appropriate? Yes; Fluid  consistency: Thin  Diet effective now                  Body mass index is 22.24 kg/m. Consultants:see note  Procedures:see note Microbiology:see note Blood Culture    Component Value Date/Time   SDES URINE, RANDOM 11/01/2019 1831   SPECREQUEST  11/01/2019 1831    NONE Performed at [facility], [place] [address], [place], [place] [address]    CULT >=100,000 COLONIES/mL ESCHERICHIA COLI (A) 11/01/2019 1831   REPTSTATUS 11/03/2019 FINAL 11/01/2019 1831    Other  culture-see note  Medications: Scheduled Meds: . acetaminophen (TYLENOL) oral liquid 160 mg/5 mL  500 mg Per Tube Q6H  . artificial tears   Right Eye Q4H  . bethanechol  10 mg Per Tube TID  . Chlorhexidine Gluconate Cloth  6 each Topical Daily  . docusate  100 mg Per Tube Daily  . enoxaparin (LOVENOX) injection  40 mg Subcutaneous Q24H  . feeding supplement (PROSource TF)  45 mL Per Tube BID  . ketorolac  7.5 mg Intravenous Q6H  . pantoprazole sodium  40 mg Per Tube Daily  . povidone-iodine  2 application Topical Once  . QUEtiapine  25 mg Per Tube QHS   Continuous Infusions: . sodium chloride 75 mL/hr at 02/18/20 2029  . methocarbamol (ROBAXIN) IV      Antimicrobials: Anti-infectives (From admission, onward)   Start     Dose/Rate Route Frequency Ordered Stop   02/18/20 2032  ceFAZolin (ANCEF) 2-4 GM/100ML-% IVPB       Note to Pharmacy: Benjamin Stain   : cabinet override      02/18/20 2032 02/18/20 2040   02/18/20 2025  ceFAZolin (ANCEF) 1-4 GM/50ML-% IVPB  Status:  Discontinued       Note to Pharmacy: Benjamin Stain   : cabinet override      02/18/20 2025 02/18/20 2032   02/18/20 2015  ceFAZolin (ANCEF) IVPB 2g/100 mL premix        2 g 200 mL/hr over 30 Minutes Intravenous Every 6 hours 02/18/20 2007 02/19/20 0255   02/18/20 1100  ceFAZolin (ANCEF) IVPB 2g/100 mL premix        2 g 200 mL/hr over 30 Minutes Intravenous On call to O.R. 02/17/20 2315 02/18/20 1406     Objective: Vitals: Today's Vitals   02/19/20 0513 02/19/20 0822 02/19/20 0848 02/19/20 0912  BP: 100/60  (!) 80/54 (!) 79/51  Pulse: 76 84 72   Resp: 16 17 16    Temp: 98.2 F (36.8 C)  97.8 F (36.6 C)   TempSrc: Oral  Oral   SpO2: 95% 98% 97%   Weight:      Height:      PainSc:        Intake/Output Summary (Last 24 hours) at 02/19/2020 0939 Last data filed at 02/19/2020 0517 Gross per 24 hour  Intake 3313.75 ml  Output 1750 ml  Net 1563.75 ml   Filed Weights   02/18/20 2108  Weight: 70.3 kg    Weight change:   Intake/Output from previous day: 08/31 0701 - 09/01 0700 In: 3313.8 [P.O.:220; I.V.:2913.8; IV Piggyback:100] Out: 1750 [Urine:1350; Blood:400] Intake/Output this shift: No intake/output data recorded.  Examination: General exam: AAOx3 ,NAD, weak appearing. HEENT:Oral mucosa moist, Ear/Nose WNL grossly,dentition normal. Respiratory system: bilaterally clear, trach+ w/ valve on. no wheezing or crackles,no use of accessory muscle, non tender. Cardiovascular system: S1 & S2 +, regular, No JVD. Gastrointestinal system: Abdomen soft, NT,ND, BS+. Nervous System:Alert, awake,  moving extremities and grossly nonfocal.  Right facial and ocular palsy present Extremities: No edema, distal peripheral pulses palpable.  Skin: No rashes,no icterus. MSK: Normal muscle bulk,tone, power right hip surgical site with dressing intact no bleeding  Data Reviewed: I have personally reviewed following labs and imaging studies CBC: Recent Labs  Lab 02/17/20 2147 02/18/20 0404 02/19/20 0526  WBC 10.8* 10.9* 12.2*  NEUTROABS  --  7.9*  --   HGB 12.2* 11.6* 8.4*  HCT 38.5* 36.0* 26.6*  MCV 88.3 88.5 90.2  PLT 182 174 627*   Basic Metabolic Panel: Recent Labs  Lab 02/17/20 2147 02/18/20 0404 02/19/20 0526  NA 139 140 137  K 3.9 3.8 4.3  CL 102 104 105  CO2 27 27 25   GLUCOSE 121* 110* 161*  BUN 22 17 17   CREATININE 0.74 0.65 0.81  CALCIUM 8.9 8.6* 8.1*  MG  --  2.0  --    GFR: Estimated Creatinine Clearance: 90.4 mL/min (by C-G formula based on SCr of 0.81 mg/dL). Liver Function Tests: Recent Labs  Lab 02/17/20 2147 02/18/20 0404  AST 20 19  ALT 30 28  ALKPHOS 77 71  BILITOT 0.7 0.6  PROT 7.7 7.1  ALBUMIN 4.2 3.8   No results for input(s): LIPASE, AMYLASE in the last 168 hours. No results for input(s): AMMONIA in the last 168 hours. Coagulation Profile: Recent Labs  Lab 02/18/20 0404  INR 1.1   Cardiac Enzymes: No results for input(s): CKTOTAL, CKMB,  CKMBINDEX, TROPONINI in the last 168 hours. BNP (last 3 results) No results for input(s): PROBNP in the last 8760 hours. HbA1C: No results for input(s): HGBA1C in the last 72 hours. CBG: Recent Labs  Lab 02/18/20 0809 02/18/20 1046 02/19/20 0002 02/19/20 0408 02/19/20 0743  GLUCAP 104* 107* 137* 135* 125*   Lipid Profile: No results for input(s): CHOL, HDL, LDLCALC, TRIG, CHOLHDL, LDLDIRECT in the last 72 hours. Thyroid Function Tests: No results for input(s): TSH, T4TOTAL, FREET4, T3FREE, THYROIDAB in the last 72 hours. Anemia Panel: No results for input(s): VITAMINB12, FOLATE, FERRITIN, TIBC, IRON, RETICCTPCT in the last 72 hours. Sepsis Labs: No results for input(s): PROCALCITON, LATICACIDVEN in the last 168 hours.  Recent Results (from the past 240 hour(s))  SARS Coronavirus 2 by RT PCR (hospital order, performed in [facility] hospital lab) Nasopharyngeal Nasopharyngeal Swab     Status: None   Collection Time: 02/17/20  9:47 PM   Specimen: Nasopharyngeal Swab  Result Value Ref Range Status   SARS Coronavirus 2 NEGATIVE NEGATIVE Final    Comment: (NOTE) SARS-CoV-2 target nucleic acids are NOT DETECTED.  The SARS-CoV-2 RNA is generally detectable in upper and lower respiratory specimens during the acute phase of infection. The lowest concentration of SARS-CoV-2 viral copies this assay can detect is 250 copies / mL. A negative result does not preclude SARS-CoV-2 infection and should not be used as the sole basis for treatment or other patient management decisions.  A negative result may occur with improper specimen collection / handling, submission of specimen other than nasopharyngeal swab, presence of viral mutation(s) within the areas targeted by this assay, and inadequate number of viral copies (<250 copies / mL). A negative result must be combined with clinical observations, patient history, and epidemiological information.  Fact Sheet for Patients:    StrictlyIdeas.no  Fact Sheet for Healthcare Providers: BankingDealers.co.za  This test is not yet approved or  cleared by the [place] FDA and has been authorized for detection and/or diagnosis of  SARS-CoV-2 by FDA under an Emergency Use Authorization (EUA).  This EUA will remain in effect (meaning this test can be used) for the duration of the COVID-19 declaration under Section 564(b)(1) of the Act, 21 U.S.C. section 360bbb-3(b)(1), unless the authorization is terminated or revoked sooner.  Performed at [facility], [place] [address] [place], [place] [address]   MRSA PCR Screening     Status: None   Collection Time: 02/18/20  9:14 PM   Specimen: Nasal Mucosa; Nasopharyngeal  Result Value Ref Range Status   MRSA by PCR NEGATIVE NEGATIVE Final    Comment:        The GeneXpert MRSA Assay (FDA approved for NASAL specimens only), is one component of a comprehensive MRSA colonization surveillance program. It is not intended to diagnose MRSA infection nor to guide or monitor treatment for MRSA infections. Performed at [facility], [place] [address] [place], [place] [address]      Radiology Studies: Pelvis Portable  Result Date: 02/18/2020 CLINICAL DATA:  Status post right hip replacement EXAM: PORTABLE PELVIS 1-2 VIEWS COMPARISON:  02/17/2020 FINDINGS: Pubic symphysis and rami appear intact. Left femoral head projects in joint. Status post right hip replacement with normal alignment. Possible small osseous fragment inferior to the femoral neck region of the prosthetic. IMPRESSION: Status post right hip replacement with normal alignment and intact hardware. Electronically Signed   By: Donavan Foil M.D.   On: 02/18/2020 19:36   DG Hip Port Unilat With Pelvis 1V Right  Result Date: 02/18/2020 CLINICAL DATA:  Status post hip replacement EXAM: DG HIP (WITH OR WITHOUT PELVIS) 1V PORT RIGHT  COMPARISON:  02/17/2020 FINDINGS: Pubic symphysis and rami are intact. Interval right hip replacement with intact hardware and normal alignment. Gas in the soft tissues consistent with recent surgery. IMPRESSION: Interval right hip replacement with expected postsurgical change. Electronically Signed   By: Donavan Foil M.D.   On: 02/18/2020 19:35   DG Hip Unilat  With Pelvis 2-3 Views Right  Result Date: 02/17/2020 CLINICAL DATA:  Tripped, right hip pain EXAM: DG HIP (WITH OR WITHOUT PELVIS) 2-3V RIGHT COMPARISON:  None. FINDINGS: Frontal view of the pelvis as well as frontal and frogleg lateral views of the right hip are obtained. There is a comminuted displaced basicervical right femoral neck fracture with mild impaction and valgus angulation at the fracture site. No dislocation. Remainder of the bony pelvis is unremarkable. IMPRESSION: 1. Comminuted mildly displaced right femoral neck fracture. Electronically Signed   By: Randa Ngo M.D.   On: 02/17/2020 21:28     LOS: 1 day   Antonieta Pert, MD [facility]  02/19/2020, 9:39 AM

## 2020-02-19 NOTE — Progress Notes (Signed)
PT drain sponge and site WNL at this time, suctioned PT times 2 resulted in small, white, thin secretions (tolerated well). All required equipment in room at this time (obturator for current trach unavailable- however; extra bedside trach (Shiley Flexible) has obturator in box). PT remains on humidified medical air.

## 2020-02-19 NOTE — Progress Notes (Signed)
PT agreed to be placed on humidified room air. PT denies need for suctioning at this time.

## 2020-02-19 NOTE — Progress Notes (Signed)
   02/19/20 0912  Assess: MEWS Score  BP (!) 79/51 (MD paged)  Assess: MEWS Score  MEWS Temp 0  MEWS Systolic 2  MEWS Pulse 0  MEWS RR 0  MEWS LOC 0  MEWS Score 2  MEWS Score Color Yellow  Assess: if the MEWS score is Yellow or Red  Were vital signs taken at a resting state? Yes  Focused Assessment No change from prior assessment  Early Detection of Sepsis Score *See Row Information* Low  Treat  MEWS Interventions Other (Comment) (1 liter bolus)  Take Vital Signs  Increase Vital Sign Frequency  Yellow: Q 2hr X 2 then Q 4hr X 2, if remains yellow, continue Q 4hrs  Escalate  MEWS: Escalate Yellow: discuss with charge nurse/RN and consider discussing with provider and RRT  Notify: Charge Nurse/RN  Name of Charge Nurse/RN Notified  Jinny Blossom)  Date Charge Nurse/RN Notified 02/19/20  Time Charge Nurse/RN Notified 1020  Notify: Provider  Provider Name/Title Antonieta Pert  Date Provider Notified 02/19/20  Time Provider Notified [phone]  Notification Type Page  Notification Reason Change in status  Response See new orders  Date of Provider Response 02/19/20  Time of Provider Response 0920 (face to face)

## 2020-02-19 NOTE — Progress Notes (Addendum)
PT Cancellation Note  Patient Details Name: Casey Richard MRN: [id number] DOB: [birth year]   Cancelled Treatment:    Reason Eval/Treat Not Completed: Medical issues which prohibited therapy BP remain low.  Pt receiving bolus. Will check back later today as schedule permits.  Addendum 11:37 - RN reports MD wants pt on bedrest today.  Pt to receive more fluid and PRBCs.   Jelitza Manninen,KATHrine E 02/19/2020, 10:58 AM Jannette Spanner PT, DPT Acute Rehabilitation Services Pager: [phone] Office: [phone]

## 2020-02-19 NOTE — Progress Notes (Signed)
Initial Nutrition Assessment  DOCUMENTATION CODES:   Not applicable  INTERVENTION:  -Ensure Enlive po daily, each supplement provides 350 kcal and 20 grams of protein  -Provide 1.5 cartons (347ml) Osmolite 1.5 cal QID via PEG with 10ml Prosource TF BID (TF regimen provides 2210 kcals, 111 grams of protein, 1044ml free water)   NUTRITION DIAGNOSIS:   Increased nutrient needs related to hip fracture as evidenced by estimated needs.    GOAL:   Patient will meet greater than or equal to 90% of their needs    MONITOR:   PO intake, Supplement acceptance, TF tolerance, Skin, Weight trends, Labs  REASON FOR ASSESSMENT:   Consult Enteral/tube feeding initiation and management  ASSESSMENT:   Pt with a PMH significant for V. fib arrest and complete heart block s/p pacemaker placement in 2013, urethral stricture s/p suprapubic catheter placement and right cystic schwannoma removal in April 2021 postoperatively complicated by cranial nerve injury resulting in aspiration pneumonia requiring mechanical ventilation, tracheostomy placement and PEG tube placement. Pt admitted with R femoral neck fracture s/p fall.  8/31 s/p R hip hemiarthroplasty   Pt unavailable at time of RD visit.   Reviewed RD notes from previous admission. Pt was receiving Osmolite 1.2 cal @ 51ml/hr with 30ml Prostat BID and 237ml free water Q4H via PEG tube. This was providing 2360 kcal (100% of needs), 130 grams of protein, and 2676 ml free water.Per MD documentation, pt had since transitioned to bolus feeding and was receiving 4 boluses/d PTA. Pt does take food/water po, but unclear how much at this time. RD will order bolus feeds in addition to oral nutrition supplements to help pt meet protein/kcal needs.   Reviewed wt history. No significant wt changes noted.   No PO intake documented.  Labs reviewed.  Medications: Colace, 77ml Prosource TF BID, Protonix IVF: NS @75ml /hr  NUTRITION - FOCUSED PHYSICAL  EXAM:  Unable to perform at this time, will attempt at follow-up.   Diet Order:   Diet Order            Diet full liquid Room service appropriate? Yes; Fluid consistency: Thin  Diet effective now                 EDUCATION NEEDS:   No education needs have been identified at this time  Skin:  Skin Assessment: Skin Integrity Issues: Skin Integrity Issues:: Incisions Incisions: R hip  Last BM:  pta  Height:   Ht Readings from Last 1 Encounters:  02/18/20 5\' 10"  (1.778 m)    Weight:   Wt Readings from Last 1 Encounters:  02/18/20 70.3 kg    BMI:  Body mass index is 22.24 kg/m.  Estimated Nutritional Needs:   Kcal:  2100-2400   Protein:  110-130 grams    Fluid:  >2L/d    Larkin Ina, MS, RD, LDN RD pager number and weekend/on-call pager number located in Center.

## 2020-02-19 NOTE — Progress Notes (Addendum)
Progress Note  Patient Name: Casey Richard Date of Encounter: 02/19/2020  [facility] Cardiologist: Sanda Klein, MD  Subjective   Denies any CP or SOB.   Inpatient Medications    Scheduled Meds: . acetaminophen (TYLENOL) oral liquid 160 mg/5 mL  500 mg Per Tube Q6H  . artificial tears   Right Eye Q4H  . bethanechol  10 mg Per Tube TID  . Chlorhexidine Gluconate Cloth  6 each Topical Daily  . docusate  100 mg Per Tube Daily  . enoxaparin (LOVENOX) injection  40 mg Subcutaneous Q24H  . feeding supplement (PROSource TF)  45 mL Per Tube BID  . ketorolac  7.5 mg Intravenous Q6H  . pantoprazole sodium  40 mg Per Tube Daily  . povidone-iodine  2 application Topical Once  . QUEtiapine  25 mg Per Tube QHS   Continuous Infusions: . sodium chloride 75 mL/hr at 02/18/20 2029  . methocarbamol (ROBAXIN) IV     PRN Meds: acetaminophen **OR** acetaminophen, guaiFENesin, HYDROcodone-acetaminophen, HYDROcodone-acetaminophen, ipratropium-albuterol, methocarbamol **OR** methocarbamol (ROBAXIN) IV, morphine injection, ondansetron **OR** ondansetron (ZOFRAN) IV, polyethylene glycol   Vital Signs    Vitals:   02/19/20 0822 02/19/20 0848 02/19/20 0912 02/19/20 1054  BP:  (!) 80/54 (!) 79/51 (!) 82/54  Pulse: 84 72    Resp: 17 16    Temp:  97.8 F (36.6 C)    TempSrc:  Oral    SpO2: 98% 97%    Weight:      Height:        Intake/Output Summary (Last 24 hours) at 02/19/2020 1058 Last data filed at 02/19/2020 0517 Gross per 24 hour  Intake 3313.75 ml  Output 1750 ml  Net 1563.75 ml   Last 3 Weights 02/18/2020 02/14/2020 01/20/2020  Weight (lbs) 155 lb 159 lb 3.2 oz 144 lb 4.8 oz  Weight (kg) 70.308 kg 72.213 kg 65.454 kg      Telemetry    Paced rhythm - Personally Reviewed  ECG    ventricularly paced rhythm - Personally Reviewed  Physical Exam   GEN: No acute distress.   Neck: tracheostomy in place Cardiac: RRR, no murmurs, rubs, or gallops.  Respiratory: Clear to  auscultation bilaterally. GI: Soft, nontender, non-distended  MS: No edema; No deformity. Neuro:  Nonfocal  Psych: Normal affect   Labs    High Sensitivity Troponin:  No results for input(s): TROPONINIHS in the last 720 hours.    Chemistry Recent Labs  Lab 02/17/20 2147 02/18/20 0404 02/19/20 0526  NA 139 140 137  K 3.9 3.8 4.3  CL 102 104 105  CO2 27 27 25   GLUCOSE 121* 110* 161*  BUN 22 17 17   CREATININE 0.74 0.65 0.81  CALCIUM 8.9 8.6* 8.1*  PROT 7.7 7.1  --   ALBUMIN 4.2 3.8  --   AST 20 19  --   ALT 30 28  --   ALKPHOS 77 71  --   BILITOT 0.7 0.6  --   GFRNONAA >60 >60 >60  GFRAA >60 >60 >60  ANIONGAP 10 9 7      Hematology Recent Labs  Lab 02/17/20 2147 02/17/20 2147 02/18/20 0404 02/19/20 0526 02/19/20 1026  WBC 10.8*  --  10.9* 12.2*  --   RBC 4.36  --  4.07* 2.95*  --   HGB 12.2*   < > 11.6* 8.4* 7.6*  HCT 38.5*   < > 36.0* 26.6* 24.3*  MCV 88.3  --  88.5 90.2  --  MCH 28.0  --  28.5 28.5  --   MCHC 31.7  --  32.2 31.6  --   RDW 14.4  --  14.3 14.6  --   PLT 182  --  174 144*  --    < > = values in this interval not displayed.    BNPNo results for input(s): BNP, PROBNP in the last 168 hours.   DDimer No results for input(s): DDIMER in the last 168 hours.   Radiology    Pelvis Portable  Result Date: 02/18/2020 CLINICAL DATA:  Status post right hip replacement EXAM: PORTABLE PELVIS 1-2 VIEWS COMPARISON:  02/17/2020 FINDINGS: Pubic symphysis and rami appear intact. Left femoral head projects in joint. Status post right hip replacement with normal alignment. Possible small osseous fragment inferior to the femoral neck region of the prosthetic. IMPRESSION: Status post right hip replacement with normal alignment and intact hardware. Electronically Signed   By: Donavan Foil M.D.   On: 02/18/2020 19:36   DG Hip Port Unilat With Pelvis 1V Right  Result Date: 02/18/2020 CLINICAL DATA:  Status post hip replacement EXAM: DG HIP (WITH OR WITHOUT PELVIS)  1V PORT RIGHT COMPARISON:  02/17/2020 FINDINGS: Pubic symphysis and rami are intact. Interval right hip replacement with intact hardware and normal alignment. Gas in the soft tissues consistent with recent surgery. IMPRESSION: Interval right hip replacement with expected postsurgical change. Electronically Signed   By: Donavan Foil M.D.   On: 02/18/2020 19:35   DG Hip Unilat  With Pelvis 2-3 Views Right  Result Date: 02/17/2020 CLINICAL DATA:  Tripped, right hip pain EXAM: DG HIP (WITH OR WITHOUT PELVIS) 2-3V RIGHT COMPARISON:  None. FINDINGS: Frontal view of the pelvis as well as frontal and frogleg lateral views of the right hip are obtained. There is a comminuted displaced basicervical right femoral neck fracture with mild impaction and valgus angulation at the fracture site. No dislocation. Remainder of the bony pelvis is unremarkable. IMPRESSION: 1. Comminuted mildly displaced right femoral neck fracture. Electronically Signed   By: Randa Ngo M.D.   On: 02/17/2020 21:28    Cardiac Studies   Echo 07/13/2011 Left ventricle: Systolic function was normal. Wall motion  was normal; there were no regional wall motion  abnormalities.  - Aortic valve: No evidence of vegetation.  - Mitral valve: No evidence of vegetation.  - Left atrium: No evidence of thrombus in the atrial cavity  or appendage.  - Right atrium: No evidence of thrombus in the atrial cavity  or appendage.  - Atrial septum: There was a valve-incompetent patent  foramen ovale. There was redundancy of the septum, with  borderline criteria for aneurysm.  - Tricuspid valve: No evidence of vegetation.  - Pulmonic valve: No evidence of vegetation.    Patient Profile     64 y.o. male with PMH of CHB and NSVT s/p MDT PPM 2013, CKD with PMH of urethral stricture s/p suprapubic catheter placement, h/o aspirin PNA with trach/PEG tube placement presented for R hip fracture after tripping voer his foley catheter. Cardiology  consulted for preoperative clearance. Patient is also due for PPM device change out on 9/2.   Assessment & Plan    1. Preoperative clearance  - s/p mechanical fall after tripping on his foley catheter  - underwent R hip hemiarthroplasty on 8/31 for R displaced femoral neck fracture  - recovering well after procedure, does have significant anemia  2. H/o CHB s/p PPM 2013  - planning for device change out  while the patient is hospitalized. Currently scheduled with Dr. Sallyanne Kuster on 9/2  - depend on if further treatment needed from orthopedic service, if no further treatment needed, may consider move the patient to a room at [facility] [place].  - since last echo was in 2013, consider repeat echocardiogram prior to procedure tomorrow.   3. Post op anemia: Hemoglobin 7.6 this morning. Transfuse per primary team  4. Hypotension: receiving IV hydration today.   5. Urinary retention: on folley  6. Dysphagia: has tracheostomy    For questions or updates, please contact [place] Please consult [email or web address] for contact info under        Signed, Almyra Deforest, [place]  02/19/2020, 10:58 AM    History and all data above reviewed.  Patient examined.  I agree with the findings as above.  No chest pain.  No SOB.  He did well post hip repair.   The patient exam reveals COR:RRR  ,  Lungs: Clear  ,  Abd: Positive bowel sounds, no rebound no guarding , Ext no edema  .  All available labs, radiology testing, previous records reviewed. Agree with documented assessment and plan.   CHB:  For pace maker generator change tomorrow.  At this point I don't see an indication for an echo.  Dr. Sallyanne Kuster can follow up as needed as an out patient to consider this in the future.   (Of note:  He is not vaccinated and I talked to him about this today.)     Minus Breeding  4:48 PM  02/19/2020

## 2020-02-19 NOTE — TOC Initial Note (Signed)
Transition of Care [facility]) - Initial/Assessment Note    Patient Details  Name: Casey Richard MRN: [id number] Date of Birth: 1955/12/24  Transition of Care [facility]) CM/SW Contact:    Dessa Phi, RN Phone Number: 02/19/2020, 12:19 PM  Clinical Narrative: From [facility] rep Freda Munro following-will check if has ST SNF benefits considering R hip hemi done. POD#1 R hip hemi.  Has trach/PEG/chronic f/c. Await PT recc.                Expected Discharge Plan: [place] Barriers to Discharge: Continued Medical Work up   Patient Goals and CMS Choice Patient states their goals for this hospitalization and ongoing recovery are:: return back to [place] CMS Medicare.gov Compare Post Acute Care list provided to:: Patient Choice offered to / list presented to : Patient  Expected Discharge Plan and Services Expected Discharge Plan: [place]   Discharge Planning Services: CM Consult Post Acute Care Choice: [place] Living arrangements for the past 2 months:  (LTC)                                      Prior Living Arrangements/Services Living arrangements for the past 2 months:  (LTC) Lives with:: Facility Resident Patient language and need for interpreter reviewed:: Yes Do you feel safe going back to the place where you live?: Yes      Need for Family Participation in Patient Care: No (Comment) Care giver support system in place?: Yes (comment) Current home services: DME (Trach w/collar, passy;PEG,f/c, w/c,rw.) Criminal Activity/Legal Involvement Pertinent to Current Situation/Hospitalization: No - Comment as needed  Activities of Daily Living Home Assistive Devices/Equipment: Cane (specify quad or straight), Walker (specify type), Wheelchair, Eyeglasses, Other (Comment), Vent/Trach supplies, Feeding equipment (At home patient has front wheeled walker, manual wheelchair, single point cane, trach, peg tube-[facility] has  necessary equipment for their residents) ADL Screening (condition at time of admission) Patient's cognitive ability adequate to safely complete daily activities?: Yes Is the patient deaf or have difficulty hearing?: Yes (deaf in right ear) Does the patient have difficulty seeing, even when wearing glasses/contacts?: No Does the patient have difficulty concentrating, remembering, or making decisions?: No Patient able to express need for assistance with ADLs?: Yes Does the patient have difficulty dressing or bathing?: Yes Independently performs ADLs?: No Communication: Independent Dressing (OT): Needs assistance Is this a change from baseline?: Pre-admission baseline Grooming: Needs assistance Is this a change from baseline?: Pre-admission baseline Feeding: Needs assistance Is this a change from baseline?: Pre-admission baseline Bathing: Needs assistance Is this a change from baseline?: Pre-admission baseline Toileting: Dependent Is this a change from baseline?: Change from baseline, expected to last >3days In/Out Bed: Dependent Is this a change from baseline?: Change from baseline, expected to last >3 days Walks in Home: Dependent Is this a change from baseline?: Change from baseline, expected to last >3 days Does the patient have difficulty walking or climbing stairs?: Yes (secondary to weakness) Weakness of Legs: Right (currently has fractured right hip) Weakness of Arms/Hands: None  Permission Sought/Granted Permission sought to share information with : Case Manager Permission granted to share information with : Yes, Verbal Permission Granted  Share Information with NAME: Case manager  Permission granted to share info w AGENCY: [facility]-from LTC they will check if has short term benefits since s/p R hip surgery this hospitalization  Permission granted to share  info w Relationship: Bill(friend) [phone]     Emotional Assessment Appearance:: Appears stated  age Attitude/Demeanor/Rapport: Gracious Affect (typically observed): Accepting Orientation: : Oriented to Self, Oriented to Place, Oriented to  Time, Oriented to Situation Alcohol / Substance Use: Not Applicable Psych Involvement: No (comment)  Admission diagnosis:  Closed fracture of neck of right femur, initial encounter ([place]) [S72.001A] Closed displaced fracture of right femoral neck ([place]) [S72.001A] Patient Active Problem List   Diagnosis Date Noted  . Dysphagia 02/18/2020  . Closed displaced fracture of right femoral neck ([place]) 02/18/2020  . 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 Essential hypertension 02/18/2020  . Hypoxia   . Tracheostomy status ([place])   . Ventilator dependent ([place])   . Protein-calorie malnutrition, severe 10/10/2019  . Steroid-induced hyperglycemia   . Leukocytosis   . Acute blood loss anemia   . Brain tumor ([place]) 10/07/2019  . Brain mass 09/27/2019  . Gait instability 09/27/2019  . Paroxysmal atrial tachycardia ([place]) 10/27/2015  . Nonsustained ventricular tachycardia ([place]) 02/07/2014  . CHB (complete heart block) ([place])   . Memory loss, short term   . 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 NSTEMI (non-ST elevated myocardial infarction) ([place]) 07/10/2011  . Ventricular fibrillation ([place]) 07/09/2011  . 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 Complete heart block ([place]) 07/09/2011   PCP:  Benito Mccreedy, MD Pharmacy:   [id number] [address], [place] [address] Phone: [phone] Fax: [phone]  [facility], [place] [address] Phone: [phone] Fax: [phone]     Social Determinants of Health (SDOH) Interventions    Readmission Risk Interventions No flowsheet data found.

## 2020-02-19 NOTE — Progress Notes (Signed)
Subjective: 1 Day Post-Op s/p Procedure(s): ARTHROPLASTY BIPOLAR HIP (HEMIARTHROPLASTY)  Patient reports mild pain at right hip this morning, he his happy that he was able to get more sleep last night. Denies chest pain, shortness of breath, nausea, calf pain. No other complaints.    Objective:  PE: VITALS:   Vitals:   02/19/20 0005 02/19/20 0009 02/19/20 0310 02/19/20 0513  BP: 111/75   100/60  Pulse: 86 86 81 76  Resp:  16 14 16   Temp: 99.1 F (37.3 C)   98.2 F (36.8 C)  TempSrc: Oral   Oral  SpO2: 98% 98% 99% 95%  Weight:      Height:        ABD soft Neurovascular intact Sensation intact distally Intact pulses distally Dorsiflexion/Plantar flexion intact Incision: no drainage  LABS  Results for orders placed or performed during the hospital encounter of 02/17/20 (from the past 24 hour(s))  CBG monitoring, ED     Status: Abnormal   Collection Time: 02/18/20  8:09 AM  Result Value Ref Range   Glucose-Capillary 104 (H) 70 - 99 mg/dL  CBG monitoring, ED     Status: Abnormal   Collection Time: 02/18/20 10:46 AM  Result Value Ref Range   Glucose-Capillary 107 (H) 70 - 99 mg/dL  Type and screen [place]     Status: None   Collection Time: 02/18/20 12:32 PM  Result Value Ref Range   ABO/RH(D) O POS    Antibody Screen NEG    Sample Expiration      02/21/2020,2359 Performed at [facility], [place] [address], [place], [place] [address]   MRSA PCR Screening     Status: None   Collection Time: 02/18/20  9:14 PM   Specimen: Nasal Mucosa; Nasopharyngeal  Result Value Ref Range   MRSA by PCR NEGATIVE NEGATIVE  Glucose, capillary     Status: Abnormal   Collection Time: 02/19/20 12:02 AM  Result Value Ref Range   Glucose-Capillary 137 (H) 70 - 99 mg/dL  Glucose, capillary     Status: Abnormal   Collection Time: 02/19/20  4:08 AM  Result Value Ref Range   Glucose-Capillary 135 (H) 70 - 99 mg/dL  CBC     Status: Abnormal    Collection Time: 02/19/20  5:26 AM  Result Value Ref Range   WBC 12.2 (H) 4.0 - 10.5 K/uL   RBC 2.95 (L) 4.22 - 5.81 MIL/uL   Hemoglobin 8.4 (L) 13.0 - 17.0 g/dL   HCT 26.6 (L) 39 - 52 %   MCV 90.2 80.0 - 100.0 fL   MCH 28.5 26.0 - 34.0 pg   MCHC 31.6 30.0 - 36.0 g/dL   RDW 14.6 11.5 - 15.5 %   Platelets 144 (L) 150 - 400 K/uL   nRBC 0.0 0.0 - 0.2 %  Basic metabolic panel     Status: Abnormal   Collection Time: 02/19/20  5:26 AM  Result Value Ref Range   Sodium 137 135 - 145 mmol/L   Potassium 4.3 3.5 - 5.1 mmol/L   Chloride 105 98 - 111 mmol/L   CO2 25 22 - 32 mmol/L   Glucose, Bld 161 (H) 70 - 99 mg/dL   BUN 17 8 - 23 mg/dL   Creatinine, Ser 0.81 0.61 - 1.24 mg/dL   Calcium 8.1 (L) 8.9 - 10.3 mg/dL   GFR calc non Af Amer >60 >60 mL/min   GFR calc Af Amer >60 >60 mL/min  Anion gap 7 5 - 15    Pelvis Portable  Result Date: 02/18/2020 CLINICAL DATA:  Status post right hip replacement EXAM: PORTABLE PELVIS 1-2 VIEWS COMPARISON:  02/17/2020 FINDINGS: Pubic symphysis and rami appear intact. Left femoral head projects in joint. Status post right hip replacement with normal alignment. Possible small osseous fragment inferior to the femoral neck region of the prosthetic. IMPRESSION: Status post right hip replacement with normal alignment and intact hardware. Electronically Signed   By: Donavan Foil M.D.   On: 02/18/2020 19:36   DG Hip Port Unilat With Pelvis 1V Right  Result Date: 02/18/2020 CLINICAL DATA:  Status post hip replacement EXAM: DG HIP (WITH OR WITHOUT PELVIS) 1V PORT RIGHT COMPARISON:  02/17/2020 FINDINGS: Pubic symphysis and rami are intact. Interval right hip replacement with intact hardware and normal alignment. Gas in the soft tissues consistent with recent surgery. IMPRESSION: Interval right hip replacement with expected postsurgical change. Electronically Signed   By: Donavan Foil M.D.   On: 02/18/2020 19:35   DG Hip Unilat  With Pelvis 2-3 Views Right  Result  Date: 02/17/2020 CLINICAL DATA:  Tripped, right hip pain EXAM: DG HIP (WITH OR WITHOUT PELVIS) 2-3V RIGHT COMPARISON:  None. FINDINGS: Frontal view of the pelvis as well as frontal and frogleg lateral views of the right hip are obtained. There is a comminuted displaced basicervical right femoral neck fracture with mild impaction and valgus angulation at the fracture site. No dislocation. Remainder of the bony pelvis is unremarkable. IMPRESSION: 1. Comminuted mildly displaced right femoral neck fracture. Electronically Signed   By: Randa Ngo M.D.   On: 02/17/2020 21:28    Assessment/Plan: Right femoral neck fracture: 1 Day Post-Op s/p Procedure(s):ARTHROPLASTY BIPOLAR HIP (HEMIARTHROPLASTY) - doing well, pain improved since prior to surgery  Acute Blood loss anemia: - Hbg 8.4 this morning, will continue to watch  Weightbearing: WBAT RLE Insicional and dressing care: Reinforce dressings as needed VTE prophylaxis: Lovenox 40mg  qd x 30 days Pain control: continue current regimen Follow - up plan: 2 weeks with Dr. Mardelle Matte Dispo: pending PT eval   Contact information:   Weekdays 8-5 Merlene Pulling, PA-C [phone] A fter hours and holidays please check [email or web address] for group call information for [facility]  Ventura Bruns 02/19/2020, 7:27 AM

## 2020-02-19 NOTE — Progress Notes (Signed)
Spoke with Dr. Sallyanne Kuster, the current plan is for generator change out tomorrow at 2 PM. Ok to eat breakfast tomorrow, however will be NPO after that for the procedure. Since there is no bed at [facility], current plan is to bring the patient back to [facility] after the surgery

## 2020-02-20 ENCOUNTER — Encounter (HOSPITAL_COMMUNITY)
Admission: EM | Disposition: A | Discharge status: Skilled Nursing Facility | Payer: Self-pay | Source: Home / Self Care | Attending: Internal Medicine

## 2020-02-20 DIAGNOSIS — I442 Atrioventricular block, complete: Secondary | ICD-10-CM

## 2020-02-20 DIAGNOSIS — Z45010 Encounter for checking and testing of cardiac pacemaker pulse generator [battery]: Secondary | ICD-10-CM

## 2020-02-20 HISTORY — PX: PPM GENERATOR CHANGEOUT: EP1233

## 2020-02-20 LAB — CBC
HCT: 25.3 % — ABNORMAL LOW (ref 39.0–52.0)
HCT: 27.2 % — ABNORMAL LOW (ref 39.0–52.0)
Hemoglobin: 8.1 g/dL — ABNORMAL LOW (ref 13.0–17.0)
Hemoglobin: 8.7 g/dL — ABNORMAL LOW (ref 13.0–17.0)
MCH: 28.6 pg (ref 26.0–34.0)
MCH: 28.8 pg (ref 26.0–34.0)
MCHC: 32 g/dL (ref 30.0–36.0)
MCHC: 32 g/dL (ref 30.0–36.0)
MCV: 89.4 fL (ref 80.0–100.0)
MCV: 90.1 fL (ref 80.0–100.0)
Platelets: 143 10*3/uL — ABNORMAL LOW (ref 150–400)
Platelets: 177 10*3/uL (ref 150–400)
RBC: 2.83 MIL/uL — ABNORMAL LOW (ref 4.22–5.81)
RBC: 3.02 MIL/uL — ABNORMAL LOW (ref 4.22–5.81)
RDW: 15.2 % (ref 11.5–15.5)
RDW: 15.1 % (ref 11.5–15.5)
WBC: 10.1 10*3/uL (ref 4.0–10.5)
WBC: 9.9 10*3/uL (ref 4.0–10.5)
nRBC: 0 % (ref 0.0–0.2)
nRBC: 0 % (ref 0.0–0.2)

## 2020-02-20 LAB — CREATININE, SERUM
Creatinine, Ser: 0.65 mg/dL (ref 0.61–1.24)
GFR calc Af Amer: >60 mL/min (ref >60)
GFR calc non Af Amer: >60 mL/min (ref >60)

## 2020-02-20 LAB — SURGICAL PCR SCREEN
MRSA, PCR: NEGATIVE
Staphylococcus aureus: NEGATIVE

## 2020-02-20 LAB — TYPE AND SCREEN
ABO/RH(D): O POS
Antibody Screen: NEGATIVE
Unit division: 0

## 2020-02-20 LAB — BASIC METABOLIC PANEL
Anion gap: 5 (ref 5–15)
BUN: 16 mg/dL (ref 8–23)
CO2: 24 mmol/L (ref 22–32)
Calcium: 7.7 mg/dL — ABNORMAL LOW (ref 8.9–10.3)
Chloride: 109 mmol/L (ref 98–111)
Creatinine, Ser: 0.61 mg/dL (ref 0.61–1.24)
GFR calc Af Amer: >60 mL/min (ref >60)
GFR calc non Af Amer: >60 mL/min (ref >60)
Glucose, Bld: 105 mg/dL — ABNORMAL HIGH (ref 70–99)
Potassium: 4.1 mmol/L (ref 3.5–5.1)
Sodium: 138 mmol/L (ref 135–145)

## 2020-02-20 LAB — BPAM RBC
Blood Product Expiration Date: 202109292359
ISSUE DATE / TIME: 202109011409
Unit Type and Rh: 5100

## 2020-02-20 LAB — GLUCOSE, CAPILLARY
Glucose-Capillary: 100 mg/dL — ABNORMAL HIGH (ref 70–99)
Glucose-Capillary: 106 mg/dL — ABNORMAL HIGH (ref 70–99)
Glucose-Capillary: 147 mg/dL — ABNORMAL HIGH (ref 70–99)
Glucose-Capillary: 89 mg/dL (ref 70–99)
Glucose-Capillary: 90 mg/dL (ref 70–99)
Glucose-Capillary: 97 mg/dL (ref 70–99)

## 2020-02-20 SURGERY — PPM GENERATOR CHANGEOUT

## 2020-02-20 MED ORDER — LIDOCAINE HCL (PF) 1 % IJ SOLN
INTRAMUSCULAR | Status: DC | PRN
  Administered 2020-02-20: 60 mL

## 2020-02-20 MED ORDER — ENOXAPARIN SODIUM 40 MG/0.4ML ~~LOC~~ SOLN
40.0000 mg | SUBCUTANEOUS | Status: DC
  Administered 2020-02-21 – 2020-02-27 (×7): 40 mg via SUBCUTANEOUS
  Filled 2020-02-20 (×7): qty 0.4

## 2020-02-20 MED ORDER — CEFAZOLIN SODIUM-DEXTROSE 1-4 GM/50ML-% IV SOLN
1.0000 g | Freq: Four times a day (QID) | INTRAVENOUS | Status: AC
  Administered 2020-02-20 – 2020-02-21 (×3): 1 g via INTRAVENOUS
  Filled 2020-02-20 (×3): qty 50

## 2020-02-20 MED ORDER — ACETAMINOPHEN 325 MG PO TABS: 325.0000 mg | ORAL_TABLET | ORAL | Status: DC | PRN

## 2020-02-20 MED ORDER — CEFAZOLIN SODIUM-DEXTROSE 2-4 GM/100ML-% IV SOLN
2.0000 g | INTRAVENOUS | Status: AC
  Administered 2020-02-20: 2 g via INTRAVENOUS
  Filled 2020-02-20: qty 100

## 2020-02-20 MED ORDER — SODIUM CHLORIDE 0.9 % IV SOLN
INTRAVENOUS | Status: AC
  Filled 2020-02-20: qty 2

## 2020-02-20 MED ORDER — CEFAZOLIN SODIUM-DEXTROSE 2-4 GM/100ML-% IV SOLN
INTRAVENOUS | Status: AC
  Filled 2020-02-20: qty 100

## 2020-02-20 MED ORDER — SODIUM CHLORIDE 0.9 % IV SOLN
80.0000 mg | INTRAVENOUS | Status: AC
  Administered 2020-02-20: 80 mg
  Filled 2020-02-20: qty 2

## 2020-02-20 MED ORDER — CEFAZOLIN SODIUM-DEXTROSE 2-4 GM/100ML-% IV SOLN: 2.0000 g | INTRAVENOUS | Status: DC

## 2020-02-20 MED ORDER — MORPHINE SULFATE (PF) 2 MG/ML IV SOLN
INTRAVENOUS | Status: AC
  Filled 2020-02-20: qty 1

## 2020-02-20 MED ORDER — FENTANYL CITRATE (PF) 100 MCG/2ML IJ SOLN
INTRAMUSCULAR | Status: AC
  Filled 2020-02-20: qty 2

## 2020-02-20 MED ORDER — CHLORHEXIDINE GLUCONATE 4 % EX LIQD
60.0000 mL | Freq: Once | CUTANEOUS | Status: AC
  Administered 2020-02-20: 4 via TOPICAL
  Filled 2020-02-20: qty 60

## 2020-02-20 MED ORDER — LIDOCAINE HCL 1 % IJ SOLN
INTRAMUSCULAR | Status: AC
  Filled 2020-02-20: qty 60

## 2020-02-20 MED ORDER — FENTANYL CITRATE (PF) 100 MCG/2ML IJ SOLN
INTRAMUSCULAR | Status: DC | PRN
  Administered 2020-02-20: 25 ug via INTRAVENOUS

## 2020-02-20 MED ORDER — SODIUM CHLORIDE 0.9 % IV SOLN: INTRAVENOUS | Status: DC

## 2020-02-20 MED ORDER — SODIUM CHLORIDE 0.9 % IV SOLN: 80.0000 mg | INTRAVENOUS | Status: DC

## 2020-02-20 MED ORDER — CHLORHEXIDINE GLUCONATE 4 % EX LIQD
60.0000 mL | Freq: Once | CUTANEOUS | Status: DC
  Filled 2020-02-20: qty 60

## 2020-02-20 SURGICAL SUPPLY — 8 items
CABLE SURGICAL S-101-97-12 (CABLE) ×2 IMPLANT
HOVERMATT SINGLE USE (MISCELLANEOUS) ×1 IMPLANT
IPG PACE AZUR XT DR MRI W1DR01 (Pacemaker) IMPLANT
PACE AZURE XT DR MRI W1DR01 (Pacemaker) ×2 IMPLANT
PAD PRO RADIOLUCENT 2001M-C (PAD) ×2 IMPLANT
POUCH AIGIS-R ANTIBACT PPM (Mesh General) ×2 IMPLANT
POUCH AIGIS-R ANTIBACT PPM MED (Mesh General) IMPLANT
TRAY PACEMAKER INSERTION (PACKS) ×2 IMPLANT

## 2020-02-20 NOTE — Plan of Care (Signed)

## 2020-02-20 NOTE — Progress Notes (Signed)
RT attempted to evaluate PT for scheduled visit. PT has not returned from [facility] at this time.

## 2020-02-20 NOTE — Progress Notes (Signed)
PROGRESS NOTE    Casey Richard  GOT:[id number] DOB: [birth year] DOA: 02/17/2020 PCP: Benito Mccreedy, MD   Chief Complaint  Patient presents with  . 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 Danny Lawless from orthopedic was consulted, and patient is being admitted to [facility] for further EP cardiology evaluation prior to undergoing operative intervention.   Subjective:  No more hypotension. H&H is stable at 8.1 g. Patient is going for battery change for pacemaker today at [facility] S/p Bolus  ivf yesterday    Assessment & Plan:  Right right femoral neck fracture status post fall, s/p right hip hemiarthroplasty  8/31, appreciate orthopedic inputs.  Continue WBAT RLE, reinforce dressing as needed, Lovenox 40 mg daily x30 days for DVT prophylaxis, continue pain control and follow-up 2 weeks with with Dr. Mardelle Matte. PT evaluation as tolerated.  Hypotension, suspect hypovolemia, also acute blood loss anemia peri operative .  Blood pressure stabilized after monitored PRBC and multiple bolus IV fluids  Acute blood loss anemia, perioperative: Estimated blood loss 300 ml in surgery.  H&H trended down to 7.6 g received 1 unit PRBC hemoglobin is stable at 8.1 g.  Monitor. Recent Labs  Lab 02/18/20 0404 02/19/20 0526 02/19/20 1026 02/19/20 2029 02/20/20 0542  HGB 11.6* 8.4* 7.6* 8.6* 8.1*  HCT 36.0* 26.6* 24.3* 27.0* 25.3*   History of complete heart block/V. fib arrest/status post pacemaker-he was scheduled to undergo generator change change next  week on outpatient setting and EP has seen the patient planning for battery change today as per cardiology at [facility] then bring back to [place] long since there is no bed at [facility].   Dysphagia/risk of aspiration resulting from cranial nerve injury following schwannoma surgery in 2021,  is on bolus PEG tube feeding at home and also takes by mouth.  Continue dietitian on board  S/p Trach-able to speak with valve on, RT following.  Patient desired to have decannulation done here, I have requested pulmonary to take a look at him.  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  Continue Foley in place  Essential hypertension-blood pressure medication on hold due to hypotension yesterday   GERD- keep on PPI.  DVT prophylaxis: SCDs Start: 02/18/20 2008 SCDs Start: 02/18/20 0020 Code Status:   Code Status: Full Code  Family Communication: plan of care discussed with patient at bedside. Status is: Inpatient  Remains inpatient appropriate because:Ongoing diagnostic testing needed not appropriate for outpatient work up, IV treatments appropriate due to intensity of illness or inability to take PO and Inpatient level of care appropriate due to severity of illness  Dispo: The patient is from: SNF              Anticipated d/c is to: SNF              Anticipated d/c date is: 2 days              Patient currently is not medically stable to d/c.  Nutrition: Diet Order  Diet NPO time specified Except for: Sips with Meds  Diet effective ____                 Nutrition Problem: Increased nutrient needs Etiology: hip fractureBody mass index is 24.93 kg/m. Consultants:see note  Procedures:see note Microbiology:see note Blood Culture    Component Value Date/Time   SDES URINE, RANDOM 11/01/2019 1831   SPECREQUEST  11/01/2019 1831    NONE Performed at [facility], [place] [address], [place],  [address]    CULT >=100,000 COLONIES/mL ESCHERICHIA COLI (A) 11/01/2019 1831   REPTSTATUS 11/03/2019 FINAL 11/01/2019 1831    Other culture-see note  Medications: Scheduled Meds: . [MAR Hold] artificial tears   Right Eye Q4H  . [MAR Hold] bethanechol  10 mg Per Tube TID  . chlorhexidine  60 mL Topical Once  . [MAR Hold] Chlorhexidine Gluconate Cloth  6 each Topical Daily  . [MAR Hold] docusate  100 mg Per Tube Daily  . [MAR Hold] feeding supplement (ENSURE ENLIVE)  237 mL Oral Q24H  . [MAR Hold] feeding supplement (OSMOLITE 1.5 CAL)  355 mL Per Tube QID  . [MAR Hold] feeding supplement (PROSource TF)  45 mL Per Tube BID  . [MAR Hold] pantoprazole sodium  40 mg Per Tube Daily  . [MAR Hold] povidone-iodine  2 application Topical Once  . [MAR Hold] QUEtiapine  25 mg Per Tube QHS   Continuous Infusions: . sodium chloride 50 mL/hr at 02/20/20 1333  . [MAR Hold] methocarbamol (ROBAXIN) IV      Antimicrobials: Anti-infectives (From admission, onward)   Start     Dose/Rate Route Frequency Ordered Stop   02/20/20 1400  ceFAZolin (ANCEF) IVPB 2g/100 mL premix        2 g 200 mL/hr over 30 Minutes Intravenous On call 02/20/20 0401 02/20/20 1432   02/20/20 1400  gentamicin (GARAMYCIN) 80 mg in sodium chloride 0.9 % 500 mL irrigation        80 mg Irrigation On call 02/20/20 0401 02/20/20 1433   02/20/20 0500  gentamicin (GARAMYCIN) 80 mg in sodium chloride 0.9 % 500 mL irrigation  Status:  Discontinued        80 mg Irrigation On call 02/20/20 0400 02/20/20 0401   02/20/20 0500  ceFAZolin (ANCEF) IVPB 2g/100 mL premix  Status:  Discontinued        2 g 200 mL/hr over 30 Minutes Intravenous On call 02/20/20 0400 02/20/20 0401   02/18/20 2032  ceFAZolin (ANCEF) 2-4 GM/100ML-% IVPB       Note to Pharmacy: Benjamin Stain   : cabinet override      02/18/20 2032 02/18/20 2040   02/18/20 2025  ceFAZolin (ANCEF) 1-4 GM/50ML-% IVPB  Status:  Discontinued       Note to  Pharmacy: Benjamin Stain   : cabinet override      02/18/20 2025 02/18/20 2032   02/18/20 2015  ceFAZolin (ANCEF) IVPB 2g/100 mL premix        2 g 200 mL/hr over 30 Minutes Intravenous Every 6 hours 02/18/20 2007 02/19/20 0255   02/18/20 1100  ceFAZolin (ANCEF) IVPB 2g/100 mL premix        2 g 200 mL/hr over 30 Minutes Intravenous On call to O.R. 02/17/20 2315 02/18/20 1406     Objective: Vitals: Today's Vitals   02/20/20 1452 02/20/20 1455 02/20/20 1457 02/20/20 1500  BP: 130/81  122/80   Pulse: 83  (!) 153   Resp: (!) 23  20   Temp:      TempSrc:      SpO2: 97%  97%   Weight:      Height:      PainSc:  0-No pain  0-No pain    Intake/Output Summary (Last 24 hours) at 02/20/2020 1506 Last data filed at 02/20/2020 0600 Gross per 24 hour  Intake 2421.29 ml  Output 1200 ml  Net 1221.29 ml   Filed Weights   02/18/20 2108 02/20/20 0500  Weight: 70.3 kg 78.8 kg   Weight change: 8.492 kg  Intake/Output from previous day: 09/01 0701 - 09/02 0700 In: 2421.3 [I.V.:1696.3; Blood:398; NG/GT:277] Out: 2200 [Urine:2200] Intake/Output this shift: No intake/output data recorded.  Examination: General exam: AAOx3 , NAD, weak appearing.  On trach collar HEENT:Oral mucosa moist, Ear/Nose WNL grossly, dentition normal. Respiratory system: bilaterally clear,no wheezing or crackles,no use of accessory muscle.  Tracheostomy in place Cardiovascular system: S1 & S2 +, No JVD,. Gastrointestinal system: Abdomen soft, NT,ND, BS+ Nervous System:Alert, awake, moving extremities and grossly nonfocal Extremities: Right hip surgical site with dressing intact no edema, distal peripheral pulses palpable.  Skin: No rashes,no icterus. MSK: Normal muscle bulk,tone, power  Data Reviewed: I have personally reviewed following labs and imaging studies CBC: Recent Labs  Lab 02/17/20 2147 02/17/20 2147 02/18/20 0404 02/19/20 0526 02/19/20 1026 02/19/20 2029 02/20/20 0542  WBC 10.8*  --  10.9* 12.2*   --   --  10.1  NEUTROABS  --   --  7.9*  --   --   --   --   HGB 12.2*   < > 11.6* 8.4* 7.6* 8.6* 8.1*  HCT 38.5*   < > 36.0* 26.6* 24.3* 27.0* 25.3*  MCV 88.3  --  88.5 90.2  --   --  89.4  PLT 182  --  174 144*  --   --  143*   < > = values in this interval not displayed.   Basic Metabolic Panel: Recent Labs  Lab 02/17/20 2147 02/18/20 0404 02/19/20 0526 02/20/20 0542  NA 139 140 137 138  K 3.9 3.8 4.3 4.1  CL 102 104 105 109  CO2 27 27 25 24   GLUCOSE 121* 110* 161* 105*  BUN 22 17 17 16   CREATININE 0.74 0.65 0.81 0.61  CALCIUM 8.9 8.6* 8.1* 7.7*  MG  --  2.0  --   --    GFR: Estimated Creatinine Clearance: 95.1 mL/min (by C-G formula based on SCr of 0.61 mg/dL). Liver Function Tests: Recent Labs  Lab 02/17/20 2147 02/18/20 0404  AST 20 19  ALT 30 28  ALKPHOS 77 71  BILITOT 0.7 0.6  PROT 7.7 7.1  ALBUMIN 4.2 3.8   No results for input(s): LIPASE, AMYLASE in the last 168 hours. No results for input(s): AMMONIA in the last 168 hours. Coagulation Profile: Recent Labs  Lab 02/18/20 0404  INR 1.1   Cardiac Enzymes: No results for input(s): CKTOTAL, CKMB, CKMBINDEX, TROPONINI in the last 168 hours. BNP (last 3 results) No results for input(s): PROBNP in the last 8760 hours. HbA1C: No results for input(s): HGBA1C in the last 72 hours. CBG: Recent Labs  Lab 02/19/20 2139 02/20/20 0021 02/20/20 0444 02/20/20 0808 02/20/20 1136  GLUCAP 119* 106* 100* 90 89   Lipid Profile: No results for input(s): CHOL, HDL, LDLCALC, TRIG, CHOLHDL, LDLDIRECT in the last 72 hours. Thyroid Function Tests: No results for input(s): TSH, T4TOTAL, FREET4, T3FREE, THYROIDAB in the last 72 hours. Anemia Panel:  No results for input(s): VITAMINB12, FOLATE, FERRITIN, TIBC, IRON, RETICCTPCT in the last 72 hours. Sepsis Labs: No results for input(s): PROCALCITON, LATICACIDVEN in the last 168 hours.  Recent Results (from the past 240 hour(s))  SARS Coronavirus 2 by RT PCR (hospital  order, performed in [facility] hospital lab) Nasopharyngeal Nasopharyngeal Swab     Status: None   Collection Time: 02/17/20  9:47 PM   Specimen: Nasopharyngeal Swab  Result Value Ref Range Status   SARS Coronavirus 2 NEGATIVE NEGATIVE Final    Comment: (NOTE) SARS-CoV-2 target nucleic acids are NOT DETECTED.  The SARS-CoV-2 RNA is generally detectable in upper and lower respiratory specimens during the acute phase of infection. The lowest concentration of SARS-CoV-2 viral copies this assay can detect is 250 copies / mL. A negative result does not preclude SARS-CoV-2 infection and should not be used as the sole basis for treatment or other patient management decisions.  A negative result may occur with improper specimen collection / handling, submission of specimen other than nasopharyngeal swab, presence of viral mutation(s) within the areas targeted by this assay, and inadequate number of viral copies (<250 copies / mL). A negative result must be combined with clinical observations, patient history, and epidemiological information.  Fact Sheet for Patients:   StrictlyIdeas.no  Fact Sheet for Healthcare Providers: BankingDealers.co.za  This test is not yet approved or  cleared by the [place] FDA and has been authorized for detection and/or diagnosis of SARS-CoV-2 by FDA under an Emergency Use Authorization (EUA).  This EUA will remain in effect (meaning this test can be used) for the duration of the COVID-19 declaration under Section 564(b)(1) of the Act, 21 U.S.C. section 360bbb-3(b)(1), unless the authorization is terminated or revoked sooner.  Performed at [facility], [place] [address], [place], [place] [address]   MRSA PCR Screening     Status: None   Collection Time: 02/18/20  9:14 PM   Specimen: Nasal Mucosa; Nasopharyngeal  Result Value Ref Range Status   MRSA by PCR NEGATIVE NEGATIVE Final     Comment:        The GeneXpert MRSA Assay (FDA approved for NASAL specimens only), is one component of a comprehensive MRSA colonization surveillance program. It is not intended to diagnose MRSA infection nor to guide or monitor treatment for MRSA infections. Performed at [facility], [place] [address] [place], [place] [address]   Surgical PCR screen     Status: None   Collection Time: 02/20/20  4:38 AM   Specimen: Nasal Mucosa; Nasal Swab  Result Value Ref Range Status   MRSA, PCR NEGATIVE NEGATIVE Final   Staphylococcus aureus NEGATIVE NEGATIVE Final    Comment: (NOTE) The Xpert SA Assay (FDA approved for NASAL specimens in patients 29 years of age and older), is one component of a comprehensive surveillance program. It is not intended to diagnose infection nor to guide or monitor treatment. Performed at [facility], [place] [address] [place], [place] [address]      Radiology Studies: Pelvis Portable  Result Date: 02/18/2020 CLINICAL DATA:  Status post right hip replacement EXAM: PORTABLE PELVIS 1-2 VIEWS COMPARISON:  02/17/2020 FINDINGS: Pubic symphysis and rami appear intact. Left femoral head projects in joint. Status post right hip replacement with normal alignment. Possible small osseous fragment inferior to the femoral neck region of the prosthetic. IMPRESSION: Status post right hip replacement with normal alignment and intact hardware. Electronically Signed   By: Madie Reno.D.  On: 02/18/2020 19:36   DG Hip Port Unilat With Pelvis 1V Right  Result Date: 02/18/2020 CLINICAL DATA:  Status post hip replacement EXAM: DG HIP (WITH OR WITHOUT PELVIS) 1V PORT RIGHT COMPARISON:  02/17/2020 FINDINGS: Pubic symphysis and rami are intact. Interval right hip replacement with intact hardware and normal alignment. Gas in the soft tissues consistent with recent surgery. IMPRESSION: Interval right hip replacement with expected postsurgical change.  Electronically Signed   By: Donavan Foil M.D.   On: 02/18/2020 19:35     LOS: 2 days   Antonieta Pert, MD [facility]  02/20/2020, 3:06 PM

## 2020-02-20 NOTE — Progress Notes (Signed)
PT Cancellation Note  Patient Details Name: Casey Richard MRN: [id number] DOB: 1956-02-16   Cancelled Treatment:    Reason Eval/Treat Not Completed: Patient at procedure or test/unavailable (Pt at [facility] for procedure [facility] generator changeout). Will follow at later date/time as schedule allows and pt available.)   Verner Mould, DPT Acute Rehabilitation Services  Office [phone] Pager [phone]  02/20/2020 2:06 PM

## 2020-02-20 NOTE — Op Note (Signed)
Procedure report  Procedure performed:  1. Dual chamber pacemaker generator changeout with Aegis pouch 2. Light sedation  Reason for procedure:  1. Device generator at elective replacement interval  2. Complete heart block Procedure performed by:  Sanda Klein, MD  Complications:  None  Estimated blood loss:  <5 mL  Medications administered during procedure:  Ancef 2 g intravenously,  lidocaine 1% 30 mL locally, fentanyl 25 mcg intravenously Device details:   New Generator Medtronic Azure XT DR model number [id number], serial number [id number] Right atrial lead (chronic) Medtronic , model number [id number], serial number [id number] (implanted 07/15/2011) Right ventricular lead (chronic)  Medtronic, model number [id number], serial number [id number] (implanted 07/15/2011)  Explanted generator Medtronic Revo,  model number [id number], serial number  [id number] (implanted 07/15/2011)  Procedure details:  After the risks and benefits of the procedure were discussed the patient provided informed consent. She was brought to the cardiac catheter lab in the fasting state. The patient was prepped and draped in usual sterile fashion. Local anesthesia with 1% lidocaine was administered to to the left infraclavicular area. A 5-6cm horizontal incision was made parallel with and 2-3 cm caudal to the right clavicle, in the area of an old scar.  Using minimal electrocautery and mostly sharp and blunt dissection the prepectoral pocket was opened carefully to avoid injury to the loops of chronic leads. Extensive dissection was not necessary. The device was explanted. The pocket was carefully inspected for hemostasis and flushed with copious amounts of antibiotic solution.  The leads were disconnected from the old generator and the new generator was connected to the chronic leads, with appropriate pacing noted. Testing of the lead parameters via telemetry showed excellent values. T  The entire system was then inserted in the  pre-soaked Aegis pouch and carefully inserted in the pocket with care been taking that the leads and device assumed a comfortable position without pressure on the incision. Great care was taken that the leads be located deep to the generator. The pocket was then closed in layers using 2 layers of 2-0 Vicryl, one layer of 3-0 Vicryl,  cutaneous steristrips, after which a sterile dressing was applied.   At the end of the procedure the following lead parameters were encountered:   Right atrial lead sensed P waves 3.4 mV, impedance 399 ohms, threshold 0.5 at 0.4 ms pulse width.  Right ventricular lead sensed R waves  None, impedance 418 ohms, threshold 0.5 at 0.4 ms pulse width.

## 2020-02-20 NOTE — Interval H&P Note (Signed)
History and Physical Interval Note:  02/20/2020 1:37 PM  Casey Richard  has presented today for surgery, with the diagnosis of end of life.  The various methods of treatment have been discussed with the patient and family. After consideration of risks, benefits and other options for treatment, the patient has consented to  Procedure(s): PPM GENERATOR CHANGEOUT (N/A) as a surgical intervention.  The patient's history has been reviewed, patient examined, no change in status, stable for surgery.  I have reviewed the patient's chart and labs.  Questions were answered to the patient's satisfaction.     [name]

## 2020-02-20 NOTE — Progress Notes (Signed)
Sp02 on humidified RA 96%, RR 16, BBS clear-diminished. PT suctioned times 1- resulted in small, thin,white secretions- tolerated well. Changed PT drain sponge and checked inner cannula which was within normal range. Required equipment in room at this time- with exception of obturator for current trach. However, extra trach at bedside has obturator.

## 2020-02-20 NOTE — Progress Notes (Signed)
Subjective: 2 Days Post-Op s/p Procedure(s): ARTHROPLASTY BIPOLAR HIP (HEMIARTHROPLASTY)  Patient reports pain at right hip to be moderate and constant. Otherwise no complaints this morning.   Objective:  PE: VITALS:   Vitals:   02/19/20 2322 02/20/20 0406 02/20/20 0434 02/20/20 0500  BP:   112/64   Pulse: 93 90 90   Resp: 16 14 13    Temp:   100 F (37.8 C)   TempSrc:   Oral   SpO2: 95% 95% 93%   Weight:    78.8 kg  Height:        ABD soft Neurovascular intact Sensation intact distally Intact pulses distally Dorsiflexion/Plantar flexion intact Incision: no drainage Compartment soft  LABS  Results for orders placed or performed during the hospital encounter of 02/17/20 (from the past 24 hour(s))  Glucose, capillary     Status: Abnormal   Collection Time: 02/19/20  7:43 AM  Result Value Ref Range   Glucose-Capillary 125 (H) 70 - 99 mg/dL  Hemoglobin and hematocrit, blood     Status: Abnormal   Collection Time: 02/19/20 10:26 AM  Result Value Ref Range   Hemoglobin 7.6 (L) 13.0 - 17.0 g/dL   HCT 24.3 (L) 39 - 52 %  Prepare RBC (crossmatch)     Status: None   Collection Time: 02/19/20 11:05 AM  Result Value Ref Range   Order Confirmation      ORDER PROCESSED BY BLOOD BANK Performed at [facility], [place] [address], [place], [place] [address]   Glucose, capillary     Status: Abnormal   Collection Time: 02/19/20 11:45 AM  Result Value Ref Range   Glucose-Capillary 109 (H) 70 - 99 mg/dL  Glucose, capillary     Status: Abnormal   Collection Time: 02/19/20  4:20 PM  Result Value Ref Range   Glucose-Capillary 110 (H) 70 - 99 mg/dL  Hemoglobin and hematocrit, blood     Status: Abnormal   Collection Time: 02/19/20  8:29 PM  Result Value Ref Range   Hemoglobin 8.6 (L) 13.0 - 17.0 g/dL   HCT 27.0 (L) 39 - 52 %  Glucose, capillary     Status: Abnormal   Collection Time: 02/19/20  9:39 PM  Result Value Ref Range   Glucose-Capillary 119 (H) 70  - 99 mg/dL  Glucose, capillary     Status: Abnormal   Collection Time: 02/20/20 12:21 AM  Result Value Ref Range   Glucose-Capillary 106 (H) 70 - 99 mg/dL  Glucose, capillary     Status: Abnormal   Collection Time: 02/20/20  4:44 AM  Result Value Ref Range   Glucose-Capillary 100 (H) 70 - 99 mg/dL  CBC     Status: Abnormal   Collection Time: 02/20/20  5:42 AM  Result Value Ref Range   WBC 10.1 4.0 - 10.5 K/uL   RBC 2.83 (L) 4.22 - 5.81 MIL/uL   Hemoglobin 8.1 (L) 13.0 - 17.0 g/dL   HCT 25.3 (L) 39 - 52 %   MCV 89.4 80.0 - 100.0 fL   MCH 28.6 26.0 - 34.0 pg   MCHC 32.0 30.0 - 36.0 g/dL   RDW 15.2 11.5 - 15.5 %   Platelets 143 (L) 150 - 400 K/uL   nRBC 0.0 0.0 - 0.2 %    Pelvis Portable  Result Date: 02/18/2020 CLINICAL DATA:  Status post right hip replacement EXAM: PORTABLE PELVIS 1-2 VIEWS COMPARISON:  02/17/2020 FINDINGS: Pubic symphysis and rami appear intact. Left femoral head  projects in joint. Status post right hip replacement with normal alignment. Possible small osseous fragment inferior to the femoral neck region of the prosthetic. IMPRESSION: Status post right hip replacement with normal alignment and intact hardware. Electronically Signed   By: Donavan Foil M.D.   On: 02/18/2020 19:36   DG Hip Port Unilat With Pelvis 1V Right  Result Date: 02/18/2020 CLINICAL DATA:  Status post hip replacement EXAM: DG HIP (WITH OR WITHOUT PELVIS) 1V PORT RIGHT COMPARISON:  02/17/2020 FINDINGS: Pubic symphysis and rami are intact. Interval right hip replacement with intact hardware and normal alignment. Gas in the soft tissues consistent with recent surgery. IMPRESSION: Interval right hip replacement with expected postsurgical change. Electronically Signed   By: Donavan Foil M.D.   On: 02/18/2020 19:35    Assessment/Plan: Right femoral neck fracture: 2 Days Post-Op s/p Procedure(s): ARTHROPLASTY BIPOLAR HIP (HEMIARTHROPLASTY) - doing well, no plans for further surgical intervention  from an orthopedic standpoint   Acute Blood loss anemia: Hbg trending down 8.6 yesterday, 8.1 today - will continue to watch  Weightbearing: WBAT RLE, continue posterior hip precautions Insicional and dressing care: Reinforce dressings as needed VTE prophylaxis: Lovenox 40mg  qd x 30 days Pain control: continue current regimen Follow - up plan: 2 weeks with Dr. Mardelle Matte Dispo: pending PT eval - was unable to be performed yesterday due to hypotension  Contact information:   Weekdays 8-5 Merlene Pulling, PA-C [phone] A fter hours and holidays please check [email or web address] for group call information for [facility]  Ventura Bruns 02/20/2020, 6:26 AM

## 2020-02-20 NOTE — Consult Note (Signed)
NAME:  Casey Richard, MRN:  [id number], DOB:  [birth year], LOS: 2 ADMISSION DATE:  02/17/2020, CONSULTATION DATE:  9/2 REFERRING MD:  Lupita Leash, CHIEF COMPLAINT:  Trach and possible decannulation   Brief History   64 year old male known to our service from prior hospitalization where we saw him postoperatively after he had craniotomy/resection of cystic schwannoma.  Because of the location of the tumor he had significant pharyngeal dysfunction and recurrent aspiration due to pharyngeal dysfunction.  He was ultimately discharged to skilled nursing facility back in May 2021, he is since had 2 different modified barium swallows the most recent in August 2021 showing improved pharyngeal function, and no aspiration.  He presents to the hospital after a fall where he sustained a Right hip fracture.  He is status post hemiarthroplasty.  Because he has tracheostomy, and has done quite well, pulmonary was asked to evaluate in regards to potentially decannulating him.  History of present illness   64 year old male patient who we actually saw in critical care consultation back in April 2021 after he was taken to the operating room where he underwent craniotomy for resection of cystic schwannoma.  His postoperative course was complicated by severe dysphagia and recurrent aspiration, this was felt all the consequence of bleeding location/tumor location affecting pharyngeal function.  He ultimately required PEG placement on 4/30, and we had discussed possible decannulation however it was the opinion of neurosurgery at that time the he would benefit from further healing postoperatively so that pharyngeal function might improve, therefore it was decided to delay decannulation.  He was ultimately discharged to skilled nursing facility.  Review of outpatient records show he had his first modified barium swallow on 6/29, it was noted that his dysphagia had improved however he still had limited ability to clear his pharynx there is  trace penetration and moderate amount of residual .  He did demonstrate aspiration at that time.  A repeat barium swallow was obtained on January 28, 2020 this again showed additional improvements when comparing MBS in June.  But there was still significantly reduced movement at the base of the tongue, hypopharynx complex and epiglottis.  He had improved pharyngeal clearing.  Most notably however there was no penetration or aspiration throughout the study.  At that time it was recommended that he could initiate dysphagia to chopped solids with thin liquid diet as long as he had additional dysphagia precautions.  On 8/30 he was admitted to the hospital status post fall, where he ultimately sustained right femoral neck fracture he went to the operating room on 8/31 where he went underwent right hip hemiarthroplasty, and he has been followed postoperatively since that point by the internal medicine service.  Additional hospital course has been notable for the following: He has been followed by cardiology given his history of complete heart block he was actually scheduled to have his pacemaker generated changed out, with plan to do this on 9/2.  He is tolerated aerosol trach collar during his hospitalization.  He did have some mild hypotension in the a.m. of 9/1 he did require transfusion.  He has been slowly recovering from his hip arthroplasty, but because of his tracheostomy pulmonary was asked to evaluate specifically in regards to possible decannulation  Past Medical History  Craniotomy/resection of cystic schwannoma,Dysphagia, complete heart block, CKD, urethral stricture, ventricular tachycardia, permanent pacemaker, seasonal allergies.  Significant Hospital Events   8/31: Open reduction internal fixation of right hip   Consults:  Pulmonary   Procedures:  Significant Diagnostic Tests:    Micro Data:  NA  Antimicrobials:  NA   Interim history/subjective:  Doing well.  Very articulate.   No distress does have some right-sided hip discomfort with movement  Objective   Blood pressure 112/64, pulse 87, temperature 100 F (37.8 C), temperature source Oral, resp. rate 15, height 5\' 10"  (1.778 m), weight 78.8 kg, SpO2 95 %.        Intake/Output Summary (Last 24 hours) at 02/20/2020 0929 Last data filed at 02/20/2020 0600 Gross per 24 hour  Intake 2421.29 ml  Output 2200 ml  Net 221.29 ml   Filed Weights   02/18/20 2108 02/20/20 0500  Weight: 70.3 kg 78.8 kg    Examination: General: Very pleasant 64 year old white male sitting up in bed was talking on phone prior to my arrival HENT: He has a right eye patch in place.  Right facial droop.  Currently has size 6 cuffless tracheostomy in place Passy-Muir valve is in place he is able to talk with tracheostomy occlusion using his finger as well exhibiting absolutely no shortness of breath with this he actually feels like he can breathe better with this Lungs: Clear to auscultation without accessory use currently on humidified room air Cardiovascular: Regular rate and rhythm without audible murmur rub or gallop Abdomen: Soft not tender PEG tube in place Extremities: Warm dry brisk capillary refill Neuro: Awake oriented right facial droop GU: Clear yellow via Foley catheter  Resolved Hospital Problem list     Assessment & Plan:  Right hip fracture now status post hemiarthroplasty History of cystic schwannoma requiring craniotomy/resection April 2021 Severe dysphagia secondary to pharyngeal dysfunction, improving/almost completely resolved Tracheostomy dependence History of complete heart block with permanent pacemaker, due to have generator replaced today 9/2 Hypertension Urinary stricture GERD  Discussion Tracheostomy dependence following craniotomy for cystic schwannoma, complicated by severe pharyngeal dysfunction, dysphagia and aspiration. Serial swallow eval shows remarkable improvement Now tolerating oral  diet Tolerates PMV as well as tracheal occlusion  Plan Proceed with decannulation on 9/3, would like to have him get through his pacemaker generator exchange first in case he received sedation for this in which tracheostomy would provide 1 extra safety measure I have discussed decannulation with her his primary neurosurgeon Dr. Venetia Constable who is also in agreement with this   Best practice:  Per primary Labs   CBC: Recent Labs  Lab 02/17/20 2147 02/17/20 2147 02/18/20 0404 02/19/20 0526 02/19/20 1026 02/19/20 2029 02/20/20 0542  WBC 10.8*  --  10.9* 12.2*  --   --  10.1  NEUTROABS  --   --  7.9*  --   --   --   --   HGB 12.2*   < > 11.6* 8.4* 7.6* 8.6* 8.1*  HCT 38.5*   < > 36.0* 26.6* 24.3* 27.0* 25.3*  MCV 88.3  --  88.5 90.2  --   --  89.4  PLT 182  --  174 144*  --   --  143*   < > = values in this interval not displayed.    Basic Metabolic Panel: Recent Labs  Lab 02/17/20 2147 02/18/20 0404 02/19/20 0526 02/20/20 0542  NA 139 140 137 138  K 3.9 3.8 4.3 4.1  CL 102 104 105 109  CO2 27 27 25 24   GLUCOSE 121* 110* 161* 105*  BUN 22 17 17 16   CREATININE 0.74 0.65 0.81 0.61  CALCIUM 8.9 8.6* 8.1* 7.7*  MG  --  2.0  --   --  GFR: Estimated Creatinine Clearance: 95.1 mL/min (by C-G formula based on SCr of 0.61 mg/dL). Recent Labs  Lab 02/17/20 2147 02/18/20 0404 02/19/20 0526 02/20/20 0542  WBC 10.8* 10.9* 12.2* 10.1    Liver Function Tests: Recent Labs  Lab 02/17/20 2147 02/18/20 0404  AST 20 19  ALT 30 28  ALKPHOS 77 71  BILITOT 0.7 0.6  PROT 7.7 7.1  ALBUMIN 4.2 3.8   No results for input(s): LIPASE, AMYLASE in the last 168 hours. No results for input(s): AMMONIA in the last 168 hours.  ABG    Component Value Date/Time   PHART 7.424 10/14/2019 0930   PCO2ART 35.5 10/14/2019 0930   PO2ART 102 10/14/2019 0930   HCO3 23.0 10/14/2019 0930   TCO2 24 10/14/2019 0930   ACIDBASEDEF 1.0 10/14/2019 0930   O2SAT 98.0 10/14/2019 0930      Coagulation Profile: Recent Labs  Lab 02/18/20 0404  INR 1.1    Cardiac Enzymes: No results for input(s): CKTOTAL, CKMB, CKMBINDEX, TROPONINI in the last 168 hours.  HbA1C: No results found for: HGBA1C  CBG: Recent Labs  Lab 02/19/20 1620 02/19/20 2139 02/20/20 0021 02/20/20 0444 02/20/20 0808  GLUCAP 110* 119* 106* 100* 90    Review of Systems:   Review of Systems  Constitutional: Negative.   HENT: Negative.   Eyes: Negative.   Respiratory: Negative.   Cardiovascular: Negative.   Gastrointestinal: Negative.   Skin: Negative.   Neurological: Positive for focal weakness.  Endo/Heme/Allergies: Negative.   Psychiatric/Behavioral: Negative.      Past Medical History  He,  has a past medical history of Cardiac arrest ([place]), CHB (complete heart block) ([place]), Chronic kidney disease, Dysrhythmia (1/13), Memory loss, short term, Nonsustained ventricular tachycardia ([place]) (02/07/2014), Presence of permanent cardiac pacemaker (07/15/2011), Schwannoma, and Ureteral stricture.   Surgical History    Past Surgical History:  Procedure Laterality Date  . CARDIAC CATHETERIZATION  07/09/2011   Normal coronaries  . CRANIOTOMY Right 10/07/2019   Procedure: RIGHT CRANIOTOMY TUMOR EXCISION;  Surgeon: Judith Part, MD;  Location: [facility];  Service: Neurosurgery;  Laterality: Right;  . CRANIOTOMY Right 10/10/2019   Procedure: CRANIOTOMY FOR EVACUATION OF HEMATOMA;  Surgeon: Judith Part, MD;  Location: [facility];  Service: Neurosurgery;  Laterality: Right;  CRANIOTOMY TUMOR EXCISION  . CYSTOSCOPY WITH URETHRAL DILATATION  08/19/2011   Procedure: CYSTOSCOPY WITH URETHRAL DILATATION;  Surgeon: Dutch Gray, MD;  Location: [facility];  Service: Urology;  Laterality: N/A;  Balloon Dilation of Urethral Stricture, retro-urethrogram, Suprapubic change   . ESOPHAGOGASTRODUODENOSCOPY N/A 10/18/2019   Procedure: ESOPHAGOGASTRODUODENOSCOPY (EGD);  Surgeon: Jesusita Oka, MD;  Location: [facility]  ENDOSCOPY;  Service: Endoscopy;  Laterality: N/A;  . HIP ARTHROPLASTY Right 02/18/2020   Procedure: ARTHROPLASTY BIPOLAR HIP (HEMIARTHROPLASTY);  Surgeon: Marchia Bond, MD;  Location: [facility];  Service: Orthopedics;  Laterality: Right;  . LEFT HEART CATHETERIZATION WITH CORONARY ANGIOGRAM N/A 07/09/2011   Procedure: LEFT HEART CATHETERIZATION WITH CORONARY ANGIOGRAM;  Surgeon: Jettie Booze, MD;  Location: [facility] C[facility] LAB;  Service: Cardiovascular;  Laterality: N/A;  . PACEMAKER INSERTION  07/15/11   Medtronic Revo  . PEG PLACEMENT N/A 10/18/2019   Procedure: PERCUTANEOUS ENDOSCOPIC GASTROSTOMY (PEG) PLACEMENT;  Surgeon: Jesusita Oka, MD;  Location: [facility]OS[facility];  Service: Endoscopy;  Laterality: N/A;  . PERMANENT PACEMAKER INSERTION Left 07/15/2011   Procedure: PERMANENT PACEMAKER INSERTION;  Surgeon: Sanda Klein, MD;  Location: [facility] CATH LAB;  Service: Cardiovascular;  Laterality: Left;  . TEMPORARY PACEMAKER INSERTION Right  07/09/2011   Procedure: TEMPORARY PACEMAKER INSERTION;  Surgeon: Jettie Booze, MD;  Location: [facility] CATH LAB;  Service: Cardiovascular;  Laterality: Right;     Social History   reports that he quit smoking about 30 years ago. His smoking use included cigarettes. He has never used smokeless tobacco. He reports that he does not drink alcohol and does not use drugs.   Family History   His family history includes COPD in his father; Coronary artery disease in his brother and mother.   Allergies No Known Allergies   Home Medications  Prior to Admission medications   Medication Sig Start Date End Date Taking? Authorizing Provider  Amino Acids-Protein Hydrolys (FEEDING SUPPLEMENT, PRO-STAT SUGAR FREE 64,) LIQD Place 30 mLs into feeding tube 2 (two) times daily. 11/14/19  Yes Judith Part, MD  amLODipine (NORVASC) 10 MG tablet Place 1 tablet (10 mg total) into feeding tube daily. 11/14/19  Yes Judith Part, MD  bethanechol (URECHOLINE) 10 MG tablet Place  1 tablet (10 mg total) into feeding tube 3 (three) times daily. 11/14/19  Yes Judith Part, MD  Cholecalciferol (HM VITAMIN D3) 4000 units CAPS Take 1 capsule by mouth daily.   Yes [provider]  docusate (COLACE) 50 MG/5ML liquid Place 10 mLs (100 mg total) into feeding tube daily. 11/14/19  Yes Judith Part, MD  erythromycin ophthalmic ointment Place 1 application into the right eye 4 (four) times daily.  01/15/20  Yes [provider]  lisinopril (ZESTRIL) 5 MG tablet Place 1 tablet (5 mg total) into feeding tube daily. 11/14/19  Yes Judith Part, MD  Multiple Vitamin (MULITIVITAMIN WITH MINERALS) TABS Take 1 tablet by mouth daily.    Yes [provider]  Nutritional Supplements (ISOSOURCE 1.5 CAL) LIQD 1,000 mLs by PEG Tube route daily. 11/15/19  Yes Judith Part, MD  pantoprazole sodium (PROTONIX) 40 mg/20 mL PACK Place 20 mLs (40 mg total) into feeding tube daily. 11/14/19  Yes Judith Part, MD  QUEtiapine (SEROQUEL) 25 MG tablet Place 1 tablet (25 mg total) into feeding tube at bedtime. 11/14/19  Yes OstergardJoyice Faster, MD  vitamin B-12 (CYANOCOBALAMIN) 1000 MCG tablet Take 2,000 mcg by mouth daily.   Yes [provider]  artificial tears (LACRILUBE) OINT ophthalmic ointment Place into the right eye every 4 (four) hours. Patient not taking: Reported on 02/18/2020 11/14/19   Judith Part, MD  heparin 5000 UNIT/ML injection Inject 1 mL (5,000 Units total) into the skin every 8 (eight) hours. Patient not taking: Reported on 02/18/2020 11/14/19   Judith Part, MD  ipratropium-albuterol (DUONEB) 0.5-2.5 (3) MG/3ML SOLN Take 3 mLs by nebulization every 6 (six) hours as needed. Patient not taking: Reported on 02/18/2020 11/14/19   Judith Part, MD  Water For Irrigation, Sterile (FREE WATER) SOLN Place 200 mLs into feeding tube every 4 (four) hours. Patient not taking: Reported on 02/18/2020 11/14/19   Judith Part,  MD     Critical care time: NA       Erick Colace ACNP-BC [place] Pager # [phone] OR # [phone] if no answer

## 2020-02-21 ENCOUNTER — Encounter (HOSPITAL_COMMUNITY): Payer: Self-pay | Admitting: Cardiovascular Disease

## 2020-02-21 ENCOUNTER — Other Ambulatory Visit: Payer: Self-pay

## 2020-02-21 LAB — GLUCOSE, CAPILLARY
Glucose-Capillary: 101 mg/dL — ABNORMAL HIGH (ref 70–99)
Glucose-Capillary: 105 mg/dL — ABNORMAL HIGH (ref 70–99)
Glucose-Capillary: 110 mg/dL — ABNORMAL HIGH (ref 70–99)
Glucose-Capillary: 123 mg/dL — ABNORMAL HIGH (ref 70–99)
Glucose-Capillary: 124 mg/dL — ABNORMAL HIGH (ref 70–99)
Glucose-Capillary: 98 mg/dL (ref 70–99)

## 2020-02-21 MED FILL — Lidocaine HCl Local Inj 1%: INTRAMUSCULAR | Qty: 60 | Status: AC

## 2020-02-21 NOTE — Progress Notes (Signed)
PROGRESS NOTE    Casey Richard  PYK:[id number] DOB: [birth year] DOA: 02/17/2020 PCP: Benito Mccreedy, MD   Chief Complaint  Patient presents with  . Hip Pain    Brief Narrative: 64 year old male with history of V. fib arrest/complete heart block with pacemaker in place since 2013, urethral stricture with suprapubic catheter, right cystic schwannoma surgery in April 5397 postop complicated by cranial nerve injury resulting in aspiration pneumonia requiring mechanical ventilation, tracheostomy placement and PEG tube placement presented to ED status post fall in the evening 8/30, in the ED x-ray showed right femoral neck fracture, Dr. Danny Lawless from orthopedic was consulted, and patient is being admitted to [facility] for further EP cardiology evaluation prior to undergoing operative intervention.  Patient underwent right hip hemiarthroplasty on 8/31 by orthopedics, postop he had hypotension, acute blood loss anemia likely perioperative blood loss received IV fluid bolus and 1 unit PRBC and stabilized 02/20/20-PM generator changed at [facility] 02/21/20- planned for his trach decannulation by pccm  Subjective:  Alert awake oriented not in acute distress resting comfortably Waiting for decannulation by pulmonary today. H&H stable yesterday evening  Assessment & Plan:  Right right femoral neck fracture status post fall, s/p right hip hemiarthroplasty  8/31, appreciate orthopedic inputs.  Continue WBAT RLE, reinforce dressing as needed, Lovenox 40 mg daily x30 days for DVT prophylaxis, continue pain control and follow-up 2 weeks with with Dr. Mardelle Matte.  Continue PT OT and SNF plan  Hypotension, suspect hypovolemia, also acute blood loss anemia peri operative .  Improved after IV fluid bolus and blood transfusion 1 unit.  Meds on hold.  Acute blood loss anemia, perioperative: Estimated blood loss 300 ml in surgery.  H&H trended down to 7.6 g received 1 unit PRBC 9/1- hb at 8.7 gm,. Repeat in  am. Recent Labs  Lab 02/19/20 0526 02/19/20 1026 02/19/20 2029 02/20/20 0542 02/20/20 1919  HGB 8.4* 7.6* 8.6* 8.1* 8.7*  HCT 26.6* 24.3* 27.0* 25.3* 27.2*   History of complete heart block/V. fib arrest/status post pacemaker-he was scheduled to undergo generator change change next week on outpatient setting and EP has seen  S/p gen change by cardio at [facility] 02/20/20.  Plan for dressing change later today or tomorrow by cardiology.  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  Dysphagia/risk of aspiration resulting from cranial nerve injury following schwannoma surgery in 2021,  is on bolus PEG tube feeding at home and also takes by mouth.  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  Continue Foley in place  Essential hypertension-blood pressure medication on hold due to hypotension yesterday   GERD- keep on PPI.  DVT prophylaxis: enoxaparin (LOVENOX) injection 40 mg Start: 02/21/20 0800 SCDs Start: 02/18/20 2008 SCDs Start: 02/18/20 0020 Code Status:   Code Status: Full Code  Family Communication: plan of care discussed with patient at bedside. Status is: Inpatient  Remains inpatient appropriate because:Ongoing diagnostic testing needed not appropriate for outpatient work up, IV treatments appropriate due to intensity of illness or inability to take PO and Inpatient level of care appropriate due to severity of illness  Dispo: The patient is from: SNF              Anticipated d/c is to: SNF              Anticipated  d/c date is: 2 days once okay with PCCM/Ortho and cadrio              Patient currently is not medically stable to d/c.  Nutrition: Diet Order            Diet regular Room service appropriate? Yes; Fluid  consistency: Thin  Diet effective now                 Nutrition Problem: Increased nutrient needs Etiology: hip fractureBody mass index is 24.93 kg/m. Consultants:see note  Procedures:see note Microbiology:see note Blood Culture    Component Value Date/Time   SDES URINE, RANDOM 11/01/2019 1831   SPECREQUEST  11/01/2019 1831    NONE Performed at [facility], [place] [address], [place], [place] [address]    CULT >=100,000 COLONIES/mL ESCHERICHIA COLI (A) 11/01/2019 1831   REPTSTATUS 11/03/2019 FINAL 11/01/2019 1831    Other culture-see note  Medications: Scheduled Meds: . artificial tears   Right Eye Q4H  . bethanechol  10 mg Per Tube TID  . Chlorhexidine Gluconate Cloth  6 each Topical Daily  . docusate  100 mg Per Tube Daily  . enoxaparin (LOVENOX) injection  40 mg Subcutaneous Q24H  . feeding supplement (ENSURE ENLIVE)  237 mL Oral Q24H  . feeding supplement (OSMOLITE 1.5 CAL)  355 mL Per Tube QID  . feeding supplement (PROSource TF)  45 mL Per Tube BID  . pantoprazole sodium  40 mg Per Tube Daily  . povidone-iodine  2 application Topical Once  . QUEtiapine  25 mg Per Tube QHS   Continuous Infusions: . methocarbamol (ROBAXIN) IV      Antimicrobials: Anti-infectives (From admission, onward)   Start     Dose/Rate Route Frequency Ordered Stop   02/20/20 2000  ceFAZolin (ANCEF) IVPB 1 g/50 mL premix        1 g 100 mL/hr over 30 Minutes Intravenous Every 6 hours 02/20/20 1753 02/21/20 0921   02/20/20 1400  ceFAZolin (ANCEF) IVPB 2g/100 mL premix        2 g 200 mL/hr over 30 Minutes Intravenous On call 02/20/20 0401 02/20/20 1432   02/20/20 1400  gentamicin (GARAMYCIN) 80 mg in sodium chloride 0.9 % 500 mL irrigation        80 mg Irrigation On call 02/20/20 0401 02/20/20 1433   02/20/20 0500  gentamicin (GARAMYCIN) 80 mg in sodium chloride 0.9 % 500 mL irrigation  Status:  Discontinued        80 mg Irrigation On call 02/20/20 0400 02/20/20 0401   02/20/20 0500   ceFAZolin (ANCEF) IVPB 2g/100 mL premix  Status:  Discontinued        2 g 200 mL/hr over 30 Minutes Intravenous On call 02/20/20 0400 02/20/20 0401   02/18/20 2032  ceFAZolin (ANCEF) 2-4 GM/100ML-% IVPB       Note to Pharmacy: Benjamin Stain   : cabinet override      02/18/20 2032 02/18/20 2040   02/18/20 2025  ceFAZolin (ANCEF) 1-4 GM/50ML-% IVPB  Status:  Discontinued       Note to Pharmacy: Benjamin Stain   : cabinet override      02/18/20 2025 02/18/20 2032   02/18/20 2015  ceFAZolin (ANCEF) IVPB 2g/100 mL premix        2 g 200 mL/hr over 30 Minutes Intravenous Every 6 hours 02/18/20 2007 02/19/20 0255   02/18/20 1100  ceFAZolin (ANCEF) IVPB 2g/100 mL premix  2 g 200 mL/hr over 30 Minutes Intravenous On call to O.R. 02/17/20 2315 02/18/20 1406     Objective: Vitals: Today's Vitals   02/21/20 0634 02/21/20 0803 02/21/20 0952 02/21/20 1051  BP: 108/68  110/79   Pulse: 75 73 80   Resp: 19 17 18    Temp: 98.6 F (37 C)  98.4 F (36.9 C)   TempSrc: Oral  Oral   SpO2: 97% 97% 97%   Weight:      Height:      PainSc:    7     Intake/Output Summary (Last 24 hours) at 02/21/2020 1120 Last data filed at 02/21/2020 0959 Gross per 24 hour  Intake 1284.98 ml  Output 1400 ml  Net -115.02 ml   Filed Weights   02/18/20 2108 02/20/20 0500  Weight: 70.3 kg 78.8 kg   Weight change:   Intake/Output from previous day: 09/02 0701 - 09/03 0700 In: 1045 [I.V.:518; NG/GT:277; IV Piggyback:200] Out: 1400 [Urine:1400] Intake/Output this shift: Total I/O In: 240 [P.O.:240] Out: -   Examination: General exam: AAOx3 , NAD, weak appearing. HEENT:Oral mucosa moist, Ear/Nose WNL grossly, dentition normal. Respiratory system: bilaterally clear,no wheezing or crackles,no use of accessory muscle.  Tracheostomy in place. Cardiovascular system: S1 & S2 +, No JVD,. Gastrointestinal system: Abdomen soft, NT,ND, BS+ Nervous System:Alert, awake, moving extremities and grossly  nonfocal Extremities: No edema, distal peripheral pulses palpable.  Right hip surgical site with intact dressing Skin: No rashes,no icterus. MSK: Normal muscle bulk,tone, power    Data Reviewed: I have personally reviewed following labs and imaging studies CBC: Recent Labs  Lab 02/17/20 2147 02/17/20 2147 02/18/20 0404 02/18/20 0404 02/19/20 0526 02/19/20 1026 02/19/20 2029 02/20/20 0542 02/20/20 1919  WBC 10.8*  --  10.9*  --  12.2*  --   --  10.1 9.9  NEUTROABS  --   --  7.9*  --   --   --   --   --   --   HGB 12.2*   < > 11.6*   < > 8.4* 7.6* 8.6* 8.1* 8.7*  HCT 38.5*   < > 36.0*   < > 26.6* 24.3* 27.0* 25.3* 27.2*  MCV 88.3  --  88.5  --  90.2  --   --  89.4 90.1  PLT 182  --  174  --  144*  --   --  143* 177   < > = values in this interval not displayed.   Basic Metabolic Panel: Recent Labs  Lab 02/17/20 2147 02/18/20 0404 02/19/20 0526 02/20/20 0542 02/20/20 1919  NA 139 140 137 138  --   K 3.9 3.8 4.3 4.1  --   CL 102 104 105 109  --   CO2 27 27 25 24   --   GLUCOSE 121* 110* 161* 105*  --   BUN 22 17 17 16   --   CREATININE 0.74 0.65 0.81 0.61 0.65  CALCIUM 8.9 8.6* 8.1* 7.7*  --   MG  --  2.0  --   --   --    GFR: Estimated Creatinine Clearance: 95.1 mL/min (by C-G formula based on SCr of 0.65 mg/dL). Liver Function Tests: Recent Labs  Lab 02/17/20 2147 02/18/20 0404  AST 20 19  ALT 30 28  ALKPHOS 77 71  BILITOT 0.7 0.6  PROT 7.7 7.1  ALBUMIN 4.2 3.8   No results for input(s): LIPASE, AMYLASE in the last 168 hours. No results for input(s): AMMONIA in the  last 168 hours. Coagulation Profile: Recent Labs  Lab 02/18/20 0404  INR 1.1   Cardiac Enzymes: No results for input(s): CKTOTAL, CKMB, CKMBINDEX, TROPONINI in the last 168 hours. BNP (last 3 results) No results for input(s): PROBNP in the last 8760 hours. HbA1C: No results for input(s): HGBA1C in the last 72 hours. CBG: Recent Labs  Lab 02/20/20 1753 02/20/20 2100 02/21/20 0028  02/21/20 0443 02/21/20 0726  GLUCAP 97 147* 98 101* 110*   Lipid Profile: No results for input(s): CHOL, HDL, LDLCALC, TRIG, CHOLHDL, LDLDIRECT in the last 72 hours. Thyroid Function Tests: No results for input(s): TSH, T4TOTAL, FREET4, T3FREE, THYROIDAB in the last 72 hours. Anemia Panel: No results for input(s): VITAMINB12, FOLATE, FERRITIN, TIBC, IRON, RETICCTPCT in the last 72 hours. Sepsis Labs: No results for input(s): PROCALCITON, LATICACIDVEN in the last 168 hours.  Recent Results (from the past 240 hour(s))  SARS Coronavirus 2 by RT PCR (hospital order, performed in [facility] hospital lab) Nasopharyngeal Nasopharyngeal Swab     Status: None   Collection Time: 02/17/20  9:47 PM   Specimen: Nasopharyngeal Swab  Result Value Ref Range Status   SARS Coronavirus 2 NEGATIVE NEGATIVE Final    Comment: (NOTE) SARS-CoV-2 target nucleic acids are NOT DETECTED.  The SARS-CoV-2 RNA is generally detectable in upper and lower respiratory specimens during the acute phase of infection. The lowest concentration of SARS-CoV-2 viral copies this assay can detect is 250 copies / mL. A negative result does not preclude SARS-CoV-2 infection and should not be used as the sole basis for treatment or other patient management decisions.  A negative result may occur with improper specimen collection / handling, submission of specimen other than nasopharyngeal swab, presence of viral mutation(s) within the areas targeted by this assay, and inadequate number of viral copies (<250 copies / mL). A negative result must be combined with clinical observations, patient history, and epidemiological information.  Fact Sheet for Patients:   StrictlyIdeas.no  Fact Sheet for Healthcare Providers: BankingDealers.co.za  This test is not yet approved or  cleared by the [place] FDA and has been authorized for detection and/or diagnosis of SARS-CoV-2 by FDA  under an Emergency Use Authorization (EUA).  This EUA will remain in effect (meaning this test can be used) for the duration of the COVID-19 declaration under Section 564(b)(1) of the Act, 21 U.S.C. section 360bbb-3(b)(1), unless the authorization is terminated or revoked sooner.  Performed at [facility], [place] [address], [place], [place] [address]   MRSA PCR Screening     Status: None   Collection Time: 02/18/20  9:14 PM   Specimen: Nasal Mucosa; Nasopharyngeal  Result Value Ref Range Status   MRSA by PCR NEGATIVE NEGATIVE Final    Comment:        The GeneXpert MRSA Assay (FDA approved for NASAL specimens only), is one component of a comprehensive MRSA colonization surveillance program. It is not intended to diagnose MRSA infection nor to guide or monitor treatment for MRSA infections. Performed at [facility], [place] [address] [place], [place] [address]   Surgical PCR screen     Status: None   Collection Time: 02/20/20  4:38 AM   Specimen: Nasal Mucosa; Nasal Swab  Result Value Ref Range Status   MRSA, PCR NEGATIVE NEGATIVE Final   Staphylococcus aureus NEGATIVE NEGATIVE Final    Comment: (NOTE) The Xpert SA Assay (FDA approved for NASAL specimens in patients 74 years of age and older), is one  component of a comprehensive surveillance program. It is not intended to diagnose infection nor to guide or monitor treatment. Performed at [facility], [place] [address] [place], [place] [address]      Radiology Studies: EP PPM/ICD IMPLANT  Result Date: 02/20/2020 1. Dual chamber pacemaker generator changeout with Aegis pouch 2. Light sedation Reason for procedure: 1. Device generator at elective replacement interval 2. Complete heart block Procedure performed by: Sanda Klein, MD Complications: None Estimated blood loss: <5 mL Medications administered during procedure: Ancef 2 g intravenously,  lidocaine 1% 30 mL locally,  fentanyl 25 mcg intravenously Device details: New Generator Medtronic Azure XT DR model number [id number], serial number [id number] Right atrial lead (chronic) Medtronic , model number [id number], serial number [id number] (implanted 07/15/2011) Right ventricular lead (chronic)  Medtronic, model number [id number], serial number [id number] (implanted 07/15/2011) Explanted generator Medtronic Revo,  model number [id number], serial number  [id number] (implanted 07/15/2011) Procedure details: After the risks and benefits of the procedure were discussed the patient provided informed consent. She was brought to the cardiac catheter lab in the fasting state. The patient was prepped and draped in usual sterile fashion. Local anesthesia with 1% lidocaine was administered to to the left infraclavicular area. A 5-6cm horizontal incision was made parallel with and 2-3 cm caudal to the right clavicle, in the area of an old scar.  Using minimal electrocautery and mostly sharp and blunt dissection the prepectoral pocket was opened carefully to avoid injury to the loops of chronic leads. Extensive dissection was not necessary. The device was explanted. The pocket was carefully inspected for hemostasis and flushed with copious amounts of antibiotic solution. The leads were disconnected from the old generator and the new generator was connected to the chronic leads, with appropriate pacing noted. Testing of the lead parameters via telemetry showed excellent values. T The entire system was then inserted in the pre-soaked Aegis pouch and carefully inserted in the pocket with care been taking that the leads and device assumed a comfortable position without pressure on the incision. Great care was taken that the leads be located deep to the generator. The pocket was then closed in layers using 2 layers of 2-0 Vicryl, one layer of 3-0 Vicryl,  cutaneous steristrips, after which a sterile dressing was applied. At the end of the procedure the following lead  parameters were encountered: Right atrial lead sensed P waves 3.4 mV, impedance 399 ohms, threshold 0.5 at 0.4 ms pulse width. Right ventricular lead sensed R waves  None, impedance 418 ohms, threshold 0.5 at 0.4 ms pulse width.    LOS: 3 days   Antonieta Pert, MD [facility]  02/21/2020, 11:20 AM

## 2020-02-21 NOTE — Progress Notes (Signed)
Pt stating that he does not want to go back to [facility]. Patient and brother are questioning if there are other options that will now accept him since he no longer has a trach.

## 2020-02-21 NOTE — Progress Notes (Signed)
PT remains on room air and does not appear to be in respiratory distress at this time.

## 2020-02-21 NOTE — Progress Notes (Addendum)
Subjective: Moderate right hip pain. Patient has not yet been up out of bed since surgery.   Objective:  PE: VITALS:   Vitals:   02/21/20 0153 02/21/20 0326 02/21/20 0634 02/21/20 0803  BP: 127/65  108/68   Pulse: 77 71 75 73  Resp: 16 18 19 17   Temp: 98.1 F (36.7 C)  98.6 F (37 C)   TempSrc: Oral  Oral   SpO2: 99% 99% 97% 97%  Weight:      Height:       ABD soft Neurovascular intact Sensation intact distally Intact pulses distally Dorsiflexion/Plantar flexion intact Incision: no drainage Compartment soft  LABS  Results for orders placed or performed during the hospital encounter of 02/17/20 (from the past 24 hour(s))  Glucose, capillary     Status: None   Collection Time: 02/20/20 11:36 AM  Result Value Ref Range   Glucose-Capillary 89 70 - 99 mg/dL  Glucose, capillary     Status: None   Collection Time: 02/20/20  5:53 PM  Result Value Ref Range   Glucose-Capillary 97 70 - 99 mg/dL  CBC     Status: Abnormal   Collection Time: 02/20/20  7:19 PM  Result Value Ref Range   WBC 9.9 4.0 - 10.5 K/uL   RBC 3.02 (L) 4.22 - 5.81 MIL/uL   Hemoglobin 8.7 (L) 13.0 - 17.0 g/dL   HCT 27.2 (L) 39 - 52 %   MCV 90.1 80.0 - 100.0 fL   MCH 28.8 26.0 - 34.0 pg   MCHC 32.0 30.0 - 36.0 g/dL   RDW 15.1 11.5 - 15.5 %   Platelets 177 150 - 400 K/uL   nRBC 0 0.0 - 0.2 %  Creatinine, serum     Status: None   Collection Time: 02/20/20  7:19 PM  Result Value Ref Range   Creatinine, Ser 0.65 0.61 - 1.24 mg/dL   GFR calc non Af Amer >60 >60 mL/min   GFR calc Af Amer >60 >60 mL/min  Glucose, capillary     Status: Abnormal   Collection Time: 02/20/20  9:00 PM  Result Value Ref Range   Glucose-Capillary 147 (H) 70 - 99 mg/dL  Glucose, capillary     Status: None   Collection Time: 02/21/20 12:28 AM  Result Value Ref Range   Glucose-Capillary 98 70 - 99 mg/dL  Glucose, capillary     Status: Abnormal   Collection Time: 02/21/20  4:43 AM  Result Value Ref Range    Glucose-Capillary 101 (H) 70 - 99 mg/dL  Glucose, capillary     Status: Abnormal   Collection Time: 02/21/20  7:26 AM  Result Value Ref Range   Glucose-Capillary 110 (H) 70 - 99 mg/dL    EP PPM/ICD IMPLANT  Result Date: 02/20/2020 1. Dual chamber pacemaker generator changeout with Aegis pouch 2. Light sedation Reason for procedure: 1. Device generator at elective replacement interval 2. Complete heart block Procedure performed by: Sanda Klein, MD Complications: None Estimated blood loss: <5 mL Medications administered during procedure: Ancef 2 g intravenously,  lidocaine 1% 30 mL locally, fentanyl 25 mcg intravenously Device details: New Generator Medtronic Azure XT DR model number [id number], serial number [id number] Right atrial lead (chronic) Medtronic , model number [id number], serial number [id number] (implanted 07/15/2011) Right ventricular lead (chronic)  Medtronic, model number [id number], serial number [id number] (implanted 07/15/2011) Explanted generator Medtronic Revo,  model number [id number], serial number  [id number] (implanted 07/15/2011) Procedure details: After the risks and benefits of the  procedure were discussed the patient provided informed consent. She was brought to the cardiac catheter lab in the fasting state. The patient was prepped and draped in usual sterile fashion. Local anesthesia with 1% lidocaine was administered to to the left infraclavicular area. A 5-6cm horizontal incision was made parallel with and 2-3 cm caudal to the right clavicle, in the area of an old scar.  Using minimal electrocautery and mostly sharp and blunt dissection the prepectoral pocket was opened carefully to avoid injury to the loops of chronic leads. Extensive dissection was not necessary. The device was explanted. The pocket was carefully inspected for hemostasis and flushed with copious amounts of antibiotic solution. The leads were disconnected from the old generator and the new generator was connected to the chronic  leads, with appropriate pacing noted. Testing of the lead parameters via telemetry showed excellent values. T The entire system was then inserted in the pre-soaked Aegis pouch and carefully inserted in the pocket with care been taking that the leads and device assumed a comfortable position without pressure on the incision. Great care was taken that the leads be located deep to the generator. The pocket was then closed in layers using 2 layers of 2-0 Vicryl, one layer of 3-0 Vicryl,  cutaneous steristrips, after which a sterile dressing was applied. At the end of the procedure the following lead parameters were encountered: Right atrial lead sensed P waves 3.4 mV, impedance 399 ohms, threshold 0.5 at 0.4 ms pulse width. Right ventricular lead sensed R waves  None, impedance 418 ohms, threshold 0.5 at 0.4 ms pulse width.   Assessment/Plan: Right femoral neck fracture: - 3 days post op, right hip hemiarthroplasty - doing well, needs PT eval  Acute blood loss anemia: - Hbg trending upward at 8.7 today  Weightbearing:WBATRLE, continue posterior hip precautions, up with therapy Insicional and dressing care:Reinforce dressings as needed VTE prophylaxis:Lovenox 40mg  qdx 30 days Pain control:continue current regimen Follow - up plan:2 weekswith Dr. Mardelle Matte Dispo: pending PT eval  Contact information:   Weekdays 8-5 Merlene Pulling, PA-C [phone] A fter hours and holidays please check [email or web address] for group call information for [facility] 02/21/2020, 8:33 AM

## 2020-02-21 NOTE — Progress Notes (Signed)
PT denies need for suctioning at this time. 

## 2020-02-21 NOTE — Discharge Instructions (Signed)
INSTRUCTIONS AFTER JOINT REPLACEMENT   o Remove items at home which could result in a fall. This includes throw rugs or furniture in walking pathways o ICE to the affected joint every three hours while awake for 30 minutes at a time, for at least the first 3-5 days, and then as needed for pain and swelling.  Continue to use ice for pain and swelling. You may notice swelling that will progress down to the foot and ankle.  This is normal after surgery.  Elevate your leg when you are not up walking on it.   o Continue to use the breathing machine you got in the hospital (incentive spirometer) which will help keep your temperature down.  It is common for your temperature to cycle up and down following surgery, especially at night when you are not up moving around and exerting yourself.  The breathing machine keeps your lungs expanded and your temperature down.   DIET:  As you were doing prior to hospitalization, we recommend a well-balanced diet.  DRESSING / WOUND CARE / SHOWERING  You may change your dressing 3-5 days after surgery.  Then change the dressing every day with sterile gauze.  Please use good hand washing techniques before changing the dressing.  Do not use any lotions or creams on the incision until instructed by your surgeon.  ACTIVITY  o Increase activity slowly as tolerated, but follow the weight bearing instructions below.   o No driving for 6 weeks or until further direction given by your physician.  You cannot drive while taking narcotics.  o No lifting or carrying greater than 10 lbs. until further directed by your surgeon. o Avoid periods of inactivity such as sitting longer than an hour when not asleep. This helps prevent blood clots.  o You may return to work once you are authorized by your doctor.     WEIGHT BEARING   Weight bearing as tolerated with assist device (walker, cane, etc) as directed, use it as long as suggested by your surgeon or therapist, typically at  least 4-6 weeks.   EXERCISES  Results after joint replacement surgery are often greatly improved when you follow the exercise, range of motion and muscle strengthening exercises prescribed by your doctor. Safety measures are also important to protect the joint from further injury. Any time any of these exercises cause you to have increased pain or swelling, decrease what you are doing until you are comfortable again and then slowly increase them. If you have problems or questions, call your caregiver or physical therapist for advice.   Rehabilitation is important following a joint replacement. After just a few days of immobilization, the muscles of the leg can become weakened and shrink (atrophy).  These exercises are designed to build up the tone and strength of the thigh and leg muscles and to improve motion. Often times heat used for twenty to thirty minutes before working out will loosen up your tissues and help with improving the range of motion but do not use heat for the first two weeks following surgery (sometimes heat can increase post-operative swelling).   These exercises can be done on a training (exercise) mat, on the floor, on a table or on a bed. Use whatever works the best and is most comfortable for you.    Use music or television while you are exercising so that the exercises are a pleasant break in your day. This will make your life better with the exercises acting as a break   in your routine that you can look forward to.   Perform all exercises about fifteen times, three times per day or as directed.  You should exercise both the operative leg and the other leg as well.  Exercises include:   . Quad Sets - Tighten up the muscle on the front of the thigh (Quad) and hold for 5-10 seconds.   . Straight Leg Raises - With your knee straight (if you were given a brace, keep it on), lift the leg to 60 degrees, hold for 3 seconds, and slowly lower the leg.  Perform this exercise against  resistance later as your leg gets stronger.  . Leg Slides: Lying on your back, slowly slide your foot toward your buttocks, bending your knee up off the floor (only go as far as is comfortable). Then slowly slide your foot back down until your leg is flat on the floor again.  . Angel Wings: Lying on your back spread your legs to the side as far apart as you can without causing discomfort.  . Hamstring Strength:  Lying on your back, push your heel against the floor with your leg straight by tightening up the muscles of your buttocks.  Repeat, but this time bend your knee to a comfortable angle, and push your heel against the floor.  You may put a pillow under the heel to make it more comfortable if necessary.   A rehabilitation program following joint replacement surgery can speed recovery and prevent re-injury in the future due to weakened muscles. Contact your doctor or a physical therapist for more information on knee rehabilitation.    CONSTIPATION  Constipation is defined medically as fewer than three stools per week and severe constipation as less than one stool per week.  Even if you have a regular bowel pattern at home, your normal regimen is likely to be disrupted due to multiple reasons following surgery.  Combination of anesthesia, postoperative narcotics, change in appetite and fluid intake all can affect your bowels.   YOU MUST use at least one of the following options; they are listed in order of increasing strength to get the job done.  They are all available over the counter, and you may need to use some, POSSIBLY even all of these options:    Drink plenty of fluids (prune juice may be helpful) and high fiber foods Colace 100 mg by mouth twice a day  Senokot for constipation as directed and as needed Dulcolax (bisacodyl), take with full glass of water  Miralax (polyethylene glycol) once or twice a day as needed.  If you have tried all these things and are unable to have a bowel  movement in the first 3-4 days after surgery call either your surgeon or your primary doctor.    If you experience loose stools or diarrhea, hold the medications until you stool forms back up.  If your symptoms do not get better within 1 week or if they get worse, check with your doctor.  If you experience "the worst abdominal pain ever" or develop nausea or vomiting, please contact the office immediately for further recommendations for treatment.   ITCHING:  If you experience itching with your medications, try taking only a single pain pill, or even half a pain pill at a time.  You can also use Benadryl over the counter for itching or also to help with sleep.   TED HOSE STOCKINGS:  Use stockings on both legs until for at least 2 weeks or as   directed by physician office. They may be removed at night for sleeping.  MEDICATIONS:  See your medication summary on the "After Visit Summary" that nursing will review with you.  You may have some home medications which will be placed on hold until you complete the course of blood thinner medication.  It is important for you to complete the blood thinner medication as prescribed.  PRECAUTIONS:  If you experience chest pain or shortness of breath - call 911 immediately for transfer to the hospital emergency department.   If you develop a fever greater that 101 F, purulent drainage from wound, increased redness or drainage from wound, foul odor from the wound/dressing, or calf pain - CONTACT YOUR SURGEON.                                                   FOLLOW-UP APPOINTMENTS:  If you do not already have a post-op appointment, please call the office for an appointment to be seen by your surgeon.  Guidelines for how soon to be seen are listed in your "After Visit Summary", but are typically between 1-4 weeks after surgery.  OTHER INSTRUCTIONS:   DENTAL ANTIBIOTICS:  In most cases prophylactic antibiotics for Dental procdeures after total joint surgery are  not necessary.  Exceptions are as follows:  1. History of prior total joint infection  2. Severely immunocompromised (Organ Transplant, cancer chemotherapy, Rheumatoid biologic meds such as Monument)  3. Poorly controlled diabetes (A1C &gt; 8.0, blood glucose over 200)  If you have one of these conditions, contact your surgeon for an antibiotic prescription, prior to your dental procedure.   MAKE SURE YOU:  . Understand these instructions.  . Get help right away if you are not doing well or get worse.    Thank you for letting us be a part of your medical care team.  It is a privilege we respect greatly.  We hope these instructions will help you stay on track for a fast and full recovery!    Supplemental Discharge Instructions for  Pacemaker/Defibrillator Patients  Activity DO wear your seatbelt, even if it crosses over the pacemaker site.  WOUND CARE - Keep the wound area clean and dry.  Remove the dressing the day after you return home (usually 48 hours after the procedure). - DO NOT SUBMERGE UNDER WATER UNTIL FULLY HEALED (no tub baths, hot tubs, swimming pools, etc.).  - You  may shower or take a sponge bath after the dressing is removed. DO NOT SOAK the area and do not allow the shower to directly spray on the site. - If you have staples, these will be removed in the office in 7-14 days. - If you have tape/steri-strips on your wound, these will fall off; do not pull them off prematurely.   - No bandage is needed on the site.  DO  NOT apply any creams, oils, or ointments to the wound area. - If you notice any drainage or discharge from the wound, any swelling, excessive redness or bruising at the site, or if you develop a fever > 101? F after you are discharged home, call the office at once.  Special Instructions - You are still able to use cellular telephones.  Avoid carrying your cellular phone near your device. - When traveling through airports, show security personnel  your identification card to  avoid being screened in the metal detectors.  - Avoid arc welding equipment, MRI testing (magnetic resonance imaging), TENS units (transcutaneous nerve stimulators).  Call the office for questions about other devices. - Avoid electrical appliances that are in poor condition or are not properly grounded. - Microwave ovens are safe to be near or to operate.   Supplemental Discharge Instructions for  Pacemaker Patients  Activity may use Rt arm as needed, no leads were changed NO DRIVING is preferable for 2 weeks; IF you drive  If absolutely necessary, drive only short, familiar routes. DO wear your seatbelt, even if it crosses over the pacemaker site.  WOUND CARE - Keep the wound area clean and dry.  Remove the dressing the day after you return home (usually 48 hours after the procedure). While in nursing home, keep covered with gauze and loosely taped.  After 1 week the dressing should come off for virtual visit, leave steri strips in place they should fall off on own.  - DO NOT SUBMERGE UNDER WATER UNTIL FULLY HEALED (no tub baths, hot tubs, swimming pools, etc.).  - You  may shower or take a sponge bath after the dressing is removed. DO NOT SOAK the area and do not allow the shower to directly spray on the site. - If you have staples, these will be removed in the office in 7-14 days. - If you have tape/steri-strips on your wound, these will fall off; do not pull them off prematurely.   - No bandage is needed on the site.  DO  NOT apply any creams, oils, or ointments to the wound area. - If you notice any drainage or discharge from the wound, any swelling, excessive redness or bruising at the site, or if you develop a fever > 101? F after you are discharged home, call the office at once.  Special Instructions - You are still able to use cellular telephones.  Avoid carrying your cellular phone near your device. - When traveling through airports, show security  personnel your identification card to avoid being screened in the metal detectors.  - Avoid arc welding equipment, MRI testing (magnetic resonance imaging), TENS units (transcutaneous nerve stimulators).  Call the office for questions about other devices. - Avoid electrical appliances that are in poor condition or are not properly grounded. - Microwave ovens are safe to be near or to operate.

## 2020-02-21 NOTE — Evaluation (Signed)
Physical Therapy Evaluation Patient Details Name: Casey Richard MRN: [id number] DOB: 1955-11-19 Today's Date: 02/21/2020   History of Present Illness  64 y.o. male with PMH craniotomy/resection of cystic schwannoma in APril 2021, CHB, NSVT s/p MDT PPM 2013, urethral stricture s/p suprapubic catheter placement, h/o PNA with trach/PEG tube placement presented for R hip fracture after tripping over his foley catheter. Pt underwent Rt hemiarthroplasty on 8/31, is now s/p gen change on PPM on 9/2, and has had trach removed on 9/3.    Clinical Impression  Casey Richard is 64 y.o. male admitted with above HPI and diagnosis. Patient is currently limited by functional impairments below (see PT problem list). Patient is now POD 3 s/p Rt hemiarthroplasty and POD1 s/p PPM generator change. Patient has been recovering at [facility] for rehab since ~ May 2021 following discharge from [facility] after craniotomy. Patient currently requires Mod assist for bed mobility and functional transfers with RW. Patient will benefit from continued skilled PT interventions to address impairments and progress independence with mobility, recommending SNF level therapy follow up with 24/7 assist. Acute PT will follow and progress as able.        02/21/20 1100  PT Visit Information  Last PT Received On 02/21/20  Assistance Needed +2  History of Present Illness 64 y.o. male with PMH craniotomy/resection of cystic schwannoma in APril 2021, CHB, NSVT s/p MDT PPM 2013, urethral stricture s/p suprapubic catheter placement, h/o PNA with trach/PEG tube placement presented for R hip fracture after tripping over his foley catheter. Pt underwent Rt hemiarthroplasty on 8/31, is now s/p gen change on PPM on 9/2, and has had trach removed on 9/3.  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 His brother has been caring for them since he was hospitalized ~ 6 months ago.   Prior Function  Level of Independence Independent with assistive device(s)  Comments Pt was using cane prior to hospital admission for craniotomy in April 2021. He was independent with ADL's and IADLs. He has been [place] at [facility] since surgery. Pt was using RW at hospital in May and at [facility].  Communication  Communication No difficulties  Pain Assessment  Pain Assessment 0-10  Pain Score 1  Pain Location Rt hip  Pain Descriptors / Indicators Aching;Discomfort  Pain Intervention(s) Limited activity within patient's tolerance;Monitored during session;Repositioned;Ice applied  Cognition  Arousal/Alertness Awake/alert  Behavior During Therapy WFL for tasks assessed/performed  Overall Cognitive Status Within Functional Limits for tasks assessed  Upper Extremity Assessment  Upper Extremity Assessment Defer to OT evaluation  Lower Extremity Assessment  Lower Extremity Assessment Generalized weakness;RLE deficits/detail  RLE Deficits / Details pt with decreased strength and great difficulty andvancing LE for functional transfers and gait. 2/5 for quad activation.  RLE Sensation WNL  RLE Coordination decreased gross motor  Cervical / Trunk Assessment  Cervical / Trunk Assessment  Kyphotic  Bed Mobility  Overal bed mobility Needs Assistance  Bed Mobility Supine to Sit  Supine to sit Mod  assist;HOB elevated  General bed mobility comments cues to maintain posterior hip precautions, assist for Rt LE mobility.  Transfers  Overall transfer level Needs assistance  Equipment used Rolling walker (2 wheeled)  Transfers Sit to/from [employer] Transfers  Sit to Stand Mod assist;From elevated surface;Min assist;+2 safety/equipment  Stand pivot transfers Mod assist;From elevated surface;+2 safety/equipment  General transfer comment cues for technique with RW, and technique for power up. Cues to maintain posterior hip precautions. Min-mod assist to steady with rise. Pt required mod assist for Rt LE stepping. Pt limited by weakness and required assist for foot placement. Assist for walker management as well.  Balance  Overall balance assessment Needs assistance  Sitting-balance support Feet supported  Sitting balance-Leahy Scale Fair  Standing balance support During functional activity;Bilateral upper extremity supported  Standing balance-Leahy Scale Poor  Exercises  Exercises Total Joint  Total Joint Exercises  Ankle Circles/Pumps AROM;Both;15 reps;Seated  Quad Sets AROM;Right;10 reps;Seated  Heel Slides AAROM;Right;5 reps;Seated  PT - End of Session  Equipment Utilized During Treatment Gait belt  Activity Tolerance Patient tolerated treatment well  Patient left in chair;with call bell/phone within reach;with chair alarm set  Nurse Communication Mobility status;Other (comment) (2+ assist for transfer with RW)  PT Assessment  PT Recommendation/Assessment Patient needs continued PT services  PT Visit Diagnosis Muscle weakness (generalized) (M62.81);Difficulty in walking, not elsewhere classified (R26.2)  PT Problem List Decreased strength;Decreased range of motion;Decreased activity tolerance;Decreased balance;Decreased mobility;Decreased knowledge of use of DME;Decreased knowledge of precautions  PT Plan  PT Frequency (ACUTE ONLY) Min 3X/week  PT Treatment/Interventions  (ACUTE ONLY) DME instruction;Gait training;Stair training;Functional mobility training;Therapeutic activities;Therapeutic exercise;Balance training;Patient/family education  AM-PAC PT "6 Clicks" Mobility Outcome Measure (Version 2)  Help needed turning from your back to your side while in a flat bed without using bedrails? 2  Help needed moving from lying on your back to sitting on the side of a flat bed without using bedrails? 2  Help needed moving to and from a bed to a chair (including a wheelchair)? 2  Help needed standing up from a chair using your arms (e.g., wheelchair or bedside chair)? 2  Help needed to walk in hospital room? 2  Help needed climbing 3-5 steps with a railing?  1  6 Click Score 11  Consider Recommendation of Discharge To: CIR/SNF/LTACH  PT Recommendation  Recommendations for Other Services OT consult  Follow Up Recommendations SNF;Supervision/Assistance - 24 hour  PT equipment Rolling walker with 5" wheels  Individuals Consulted  Consulted and Agree with Results and Recommendations Patient  Acute Rehab PT Goals  Patient Stated Goal improve mobility and get recovered to get home  PT Goal Formulation With patient  Time For Goal Achievement 03/06/20  Potential to Achieve Goals Good  PT Time Calculation  PT Start Time (ACUTE ONLY) 1122  PT Stop Time (ACUTE ONLY) 1212 (10 minutes spent getting equipment)  PT Time Calculation (min) (ACUTE ONLY) 50 min  PT General Charges  $$ ACUTE PT VISIT 1 Visit  PT Evaluation  $PT Eval Moderate Complexity 1 Mod  PT Treatments  $Therapeutic Exercise 8-22 mins  $Therapeutic Activity 8-22 mins  Written Expression  Dominant Hand Right      Verner Mould, DPT Acute Rehabilitation Services  Office [phone] Pager [phone]  02/21/2020 3:48 PM

## 2020-02-21 NOTE — Progress Notes (Signed)
NAME:  DARYLE AMIS, MRN:  [id number], DOB:  [birth year], LOS: 3 ADMISSION DATE:  02/17/2020, CONSULTATION DATE:  9/2 REFERRING MD:  Lupita Leash, CHIEF COMPLAINT:  Trach and possible decannulation   Brief History   64 year old male known to our service from prior hospitalization where we saw him postoperatively after he had craniotomy/resection of cystic schwannoma.  Because of the location of the tumor he had significant pharyngeal dysfunction and recurrent aspiration due to pharyngeal dysfunction.  He was ultimately discharged to skilled nursing facility back in May 2021, he is since had 2 different modified barium swallows the most recent in August 2021 showing improved pharyngeal function, and no aspiration.  He presents to the hospital after a fall where he sustained a Right hip fracture.  He is status post hemiarthroplasty.  Because he has tracheostomy, and has done quite well, pulmonary was asked to evaluate in regards to potentially decannulating him.  History of present illness   64 year old male patient who we actually saw in critical care consultation back in April 2021 after he was taken to the operating room where he underwent craniotomy for resection of cystic schwannoma.  His postoperative course was complicated by severe dysphagia and recurrent aspiration, this was felt all the consequence of bleeding location/tumor location affecting pharyngeal function.  He ultimately required PEG placement on 4/30, and we had discussed possible decannulation however it was the opinion of neurosurgery at that time the he would benefit from further healing postoperatively so that pharyngeal function might improve, therefore it was decided to delay decannulation.  He was ultimately discharged to skilled nursing facility.  Review of outpatient records show he had his first modified barium swallow on 6/29, it was noted that his dysphagia had improved however he still had limited ability to clear his pharynx there is  trace penetration and moderate amount of residual .  He did demonstrate aspiration at that time.  A repeat barium swallow was obtained on January 28, 2020 this again showed additional improvements when comparing MBS in June.  But there was still significantly reduced movement at the base of the tongue, hypopharynx complex and epiglottis.  He had improved pharyngeal clearing.  Most notably however there was no penetration or aspiration throughout the study.  At that time it was recommended that he could initiate dysphagia to chopped solids with thin liquid diet as long as he had additional dysphagia precautions.  On 8/30 he was admitted to the hospital status post fall, where he ultimately sustained right femoral neck fracture he went to the operating room on 8/31 where he went underwent right hip hemiarthroplasty, and he has been followed postoperatively since that point by the internal medicine service.  Additional hospital course has been notable for the following: He has been followed by cardiology given his history of complete heart block he was actually scheduled to have his pacemaker generated changed out, with plan to do this on 9/2.  He is tolerated aerosol trach collar during his hospitalization.  He did have some mild hypotension in the a.m. of 9/1 he did require transfusion.  He has been slowly recovering from his hip arthroplasty, but because of his tracheostomy pulmonary was asked to evaluate specifically in regards to possible decannulation  Past Medical History  Craniotomy/resection of cystic schwannoma,Dysphagia, complete heart block, CKD, urethral stricture, ventricular tachycardia, permanent pacemaker, seasonal allergies.  Significant Hospital Events   8/31: Open reduction internal fixation of right hip   Consults:  Pulmonary   Procedures:  Lurline Idol  removed 9/3  Significant Diagnostic Tests:    Micro Data:  NA  Antimicrobials:  NA   Interim history/subjective:  Comfortable.  Lurline Idol now out   Objective   Blood pressure 110/79, pulse 80, temperature 98.4 F (36.9 C), temperature source Oral, resp. rate 18, height 5\' 10"  (1.778 m), weight 78.8 kg, SpO2 97 %.    FiO2 (%):  [28 %] 28 %   Intake/Output Summary (Last 24 hours) at 02/21/2020 1044 Last data filed at 02/21/2020 0959 Gross per 24 hour  Intake 1284.98 ml  Output 1400 ml  Net -115.02 ml   Filed Weights   02/18/20 2108 02/20/20 0500  Weight: 70.3 kg 78.8 kg    Examination:  General this is a pleasant 64 year old male. Not in distress  HENT NCAT right facial droop persists. Now has trach removed. Able to phonate well. Does have some air leaking from old stoma site. This is better when he occludes/splints the stoma pulm clear no accessory use Card rrr abd not tender  Ext warm and dry  Neuro intact  Resolved Hospital Problem list     Assessment & Plan:  Right hip fracture now status post hemiarthroplasty History of cystic schwannoma requiring craniotomy/resection April 2021 Severe dysphagia secondary to pharyngeal dysfunction, improving/almost completely resolved Tracheostomy dependence History of complete heart block with permanent pacemaker, due to have generator replaced today 9/2 Hypertension Urinary stricture GERD  Discussion Tracheostomy dependence following craniotomy for cystic schwannoma, complicated by severe pharyngeal dysfunction, dysphagia and aspiration. -I have now removed the trach. He looks excellent   Plan Keep current dressing in place for 24-48hrs Encourage him to splint site for coughing and speaking for at least 24hrs After 24-48 hrs can just keep regular band aide in place until stoma completely closed.  If stoma not completely closed   Best practice:  Per primary  Erick Colace ACNP-BC [place] Pager # [phone] OR # [phone] if no answer

## 2020-02-21 NOTE — Care Management Important Message (Signed)
Important Message  Patient Details IM Letter given to the Patient Name: Casey Richard MRN: [id number] Date of Birth: July 28, 1955   Medicare Important Message Given:  Yes     Kerin Salen 02/21/2020, 12:25 PM

## 2020-02-21 NOTE — Progress Notes (Signed)
PT de cannulated by CCM. PT is currently on RA and stoma is covered (vitals on Flowsheet- no respiratory distress noted at this time.

## 2020-02-21 NOTE — Progress Notes (Addendum)
Progress Note  Patient Name: Casey Richard Date of Encounter: 02/21/2020  [facility] Cardiologist: Sanda Klein, MD   Subjective   Mild soreness at pacer site dressing in place, no discharge today, will check with Dr. Sallyanne Kuster to keep dressing for another day.  Inpatient Medications    Scheduled Meds: . artificial tears   Right Eye Q4H  . bethanechol  10 mg Per Tube TID  . Chlorhexidine Gluconate Cloth  6 each Topical Daily  . docusate  100 mg Per Tube Daily  . enoxaparin (LOVENOX) injection  40 mg Subcutaneous Q24H  . feeding supplement (ENSURE ENLIVE)  237 mL Oral Q24H  . feeding supplement (OSMOLITE 1.5 CAL)  355 mL Per Tube QID  . feeding supplement (PROSource TF)  45 mL Per Tube BID  . pantoprazole sodium  40 mg Per Tube Daily  . povidone-iodine  2 application Topical Once  . QUEtiapine  25 mg Per Tube QHS   Continuous Infusions: .  ceFAZolin (ANCEF) IV 1 g (02/21/20 0150)  . methocarbamol (ROBAXIN) IV     PRN Meds: acetaminophen, guaiFENesin, HYDROcodone-acetaminophen, HYDROcodone-acetaminophen, ipratropium-albuterol, methocarbamol **OR** methocarbamol (ROBAXIN) IV, morphine injection, ondansetron **OR** ondansetron (ZOFRAN) IV, polyethylene glycol   Vital Signs    Vitals:   02/20/20 2153 02/21/20 0153 02/21/20 0326 02/21/20 0634  BP: 111/63 127/65  108/68  Pulse: 75 77 71 75  Resp: 14 16 18 19   Temp: 98.9 F (37.2 C) 98.1 F (36.7 C)  98.6 F (37 C)  TempSrc: Oral Oral  Oral  SpO2: 98% 99% 99% 97%  Weight:      Height:        Intake/Output Summary (Last 24 hours) at 02/21/2020 0747 Last data filed at 02/21/2020 0524 Gross per 24 hour  Intake 1044.98 ml  Output 1400 ml  Net -355.02 ml   Last 3 Weights 02/20/2020 02/18/2020 02/14/2020  Weight (lbs) 173 lb 11.6 oz 155 lb 159 lb 3.2 oz  Weight (kg) 78.8 kg 70.308 kg 72.213 kg      Telemetry    SR with V pacing - Personally Reviewed  ECG    SR with V pacing - Personally Reviewed  Physical Exam    GEN: No acute distress.  Eye patch in place on Rt Neck: No JVD, trach in place Cardiac: RRR, no murmurs, rubs, or gallops.  Respiratory: Clear to auscultation bilaterally. ant GI: Soft, nontender, non-distended  MS: No edema; No deformity. Neuro:  Nonfocal  Psych: Normal affect  Pacer site no acute swelling mild palpated under dressing.  Mildly tender  Labs    High Sensitivity Troponin:  No results for input(s): TROPONINIHS in the last 720 hours.    Chemistry Recent Labs  Lab 02/17/20 2147 02/17/20 2147 02/18/20 0404 02/18/20 0404 02/19/20 0526 02/20/20 0542 02/20/20 1919  NA 139   < > 140  --  137 138  --   K 3.9   < > 3.8  --  4.3 4.1  --   CL 102   < > 104  --  105 109  --   CO2 27   < > 27  --  25 24  --   GLUCOSE 121*   < > 110*  --  161* 105*  --   BUN 22   < > 17  --  17 16  --   CREATININE 0.74   < > 0.65   < > 0.81 0.61 0.65  CALCIUM 8.9   < > 8.6*  --  8.1* 7.7*  --   PROT 7.7  --  7.1  --   --   --   --   ALBUMIN 4.2  --  3.8  --   --   --   --   AST 20  --  19  --   --   --   --   ALT 30  --  28  --   --   --   --   ALKPHOS 77  --  71  --   --   --   --   BILITOT 0.7  --  0.6  --   --   --   --   GFRNONAA >60   < > >60   < > >60 >60 >60  GFRAA >60   < > >60   < > >60 >60 >60  ANIONGAP 10   < > 9  --  7 5  --    < > = values in this interval not displayed.     Hematology Recent Labs  Lab 02/19/20 0526 02/19/20 1026 02/19/20 2029 02/20/20 0542 02/20/20 1919  WBC 12.2*  --   --  10.1 9.9  RBC 2.95*  --   --  2.83* 3.02*  HGB 8.4*   < > 8.6* 8.1* 8.7*  HCT 26.6*   < > 27.0* 25.3* 27.2*  MCV 90.2  --   --  89.4 90.1  MCH 28.5  --   --  28.6 28.8  MCHC 31.6  --   --  32.0 32.0  RDW 14.6  --   --  15.2 15.1  PLT 144*  --   --  143* 177   < > = values in this interval not displayed.    BNPNo results for input(s): BNP, PROBNP in the last 168 hours.   DDimer No results for input(s): DDIMER in the last 168 hours.   Radiology    EP PPM/ICD  IMPLANT  Result Date: 02/20/2020 1. Dual chamber pacemaker generator changeout with Aegis pouch 2. Light sedation Reason for procedure: 1. Device generator at elective replacement interval 2. Complete heart block Procedure performed by: Sanda Klein, MD Complications: None Estimated blood loss: <5 mL Medications administered during procedure: Ancef 2 g intravenously,  lidocaine 1% 30 mL locally, fentanyl 25 mcg intravenously Device details: New Generator Medtronic Azure XT DR model number [id number], serial number [id number] Right atrial lead (chronic) Medtronic , model number [id number], serial number [id number] (implanted 07/15/2011) Right ventricular lead (chronic)  Medtronic, model number [id number], serial number [id number] (implanted 07/15/2011) Explanted generator Medtronic Revo,  model number [id number], serial number  [id number] (implanted 07/15/2011) Procedure details: After the risks and benefits of the procedure were discussed the patient provided informed consent. She was brought to the cardiac catheter lab in the fasting state. The patient was prepped and draped in usual sterile fashion. Local anesthesia with 1% lidocaine was administered to to the left infraclavicular area. A 5-6cm horizontal incision was made parallel with and 2-3 cm caudal to the right clavicle, in the area of an old scar.  Using minimal electrocautery and mostly sharp and blunt dissection the prepectoral pocket was opened carefully to avoid injury to the loops of chronic leads. Extensive dissection was not necessary. The device was explanted. The pocket was carefully inspected for hemostasis and flushed with copious amounts of antibiotic solution. The leads were disconnected from the old generator and the new generator was connected to the chronic leads, with  appropriate pacing noted. Testing of the lead parameters via telemetry showed excellent values. T The entire system was then inserted in the pre-soaked Aegis pouch and carefully inserted in  the pocket with care been taking that the leads and device assumed a comfortable position without pressure on the incision. Great care was taken that the leads be located deep to the generator. The pocket was then closed in layers using 2 layers of 2-0 Vicryl, one layer of 3-0 Vicryl,  cutaneous steristrips, after which a sterile dressing was applied. At the end of the procedure the following lead parameters were encountered: Right atrial lead sensed P waves 3.4 mV, impedance 399 ohms, threshold 0.5 at 0.4 ms pulse width. Right ventricular lead sensed R waves  None, impedance 418 ohms, threshold 0.5 at 0.4 ms pulse width.   Cardiac Studies   02/20/20 Gen change New Generator Medtronic Azure XT DR model number [id number], serial number [id number] Right atrial lead (chronic) Medtronic , model number [id number], serial number [id number] (implanted 07/15/2011) Right ventricular lead (chronic)  Medtronic, model number [id number], serial number [id number] (implanted 07/15/2011)  Explanted generator Medtronic Revo,  model number RVDR01, serial number  [id number] (implanted 07/15/2011)   Echo 07/13/2011 Left ventricle: Systolic function was normal. Wall motion  was normal; there were no regional wall motion  abnormalities.  - Aortic valve: No evidence of vegetation.  - Mitral valve: No evidence of vegetation.  - Left atrium: No evidence of thrombus in the atrial cavity  or appendage.  - Right atrium: No evidence of thrombus in the atrial cavity  or appendage.  - Atrial septum: There was a valve-incompetent patent  foramen ovale. There was redundancy of the septum, with  borderline criteria for aneurysm.  - Tricuspid valve: No evidence of vegetation.  - Pulmonic valve: No evidence of vegetation.  Patient Profile     64 y.o. male with PMH of CHB and NSVT s/p MDT PPM 2013, CKD with PMH of urethral stricture s/p suprapubic catheter placement, h/o aspirin PNA with trach/PEG tube placement presented for R  hip fracture after tripping voer his foley catheter. Pt post op now and s/p gen change on PPM POD #1  Assessment & Plan    S/p PPM gen change for ERI.  Hx CHB.  Site stable.  Will remove dressing today vs tomorrow - gen change alone.  Will schedule outpt wound site check    S/p Rt hip fx and S/P R hip hemiarthroplasty on 8/31 -progressing   Post op anemia per IM  Dysphagia with trach after cystic schwannoma requiring craniotomy   Pulmonary evaluating for decannulation.  Plan for today.      For questions or updates, please contact [place] Please consult [email or web address] for contact info under        Signed, Cecilie Kicks, NP  02/21/2020, 7:47 AM    History and all data above reviewed.  Patient examined.  I agree with the findings as above.  Mild hip and incisional pacer pain.  Now has his trach removed.   The patient exam reveals COR:RRR  ,  Lungs: Clear  ,  Abd: Positive bowel sounds, no rebound no guarding, Ext No edema  .  All available labs, radiology testing, previous records reviewed. Agree with documented assessment and plan.   PPM:  Looks ok.  Leave bandage in place tonight and can remove with only steri strips tomorrow.  No further cardiac work up.  We will arrange out patient follow up.    Jeneen Rinks  Luz Mares  10:47 AM  02/21/2020

## 2020-02-22 DIAGNOSIS — R1310 Dysphagia, unspecified: Secondary | ICD-10-CM

## 2020-02-22 LAB — GLUCOSE, CAPILLARY
Glucose-Capillary: 108 mg/dL — ABNORMAL HIGH (ref 70–99)
Glucose-Capillary: 111 mg/dL — ABNORMAL HIGH (ref 70–99)
Glucose-Capillary: 122 mg/dL — ABNORMAL HIGH (ref 70–99)
Glucose-Capillary: 122 mg/dL — ABNORMAL HIGH (ref 70–99)
Glucose-Capillary: 129 mg/dL — ABNORMAL HIGH (ref 70–99)
Glucose-Capillary: 139 mg/dL — ABNORMAL HIGH (ref 70–99)

## 2020-02-22 LAB — HEMOGLOBIN AND HEMATOCRIT, BLOOD
HCT: 26.6 % — ABNORMAL LOW (ref 39.0–52.0)
Hemoglobin: 8.4 g/dL — ABNORMAL LOW (ref 13.0–17.0)

## 2020-02-22 NOTE — Progress Notes (Signed)
Subjective: 4 days post op R hip hemi. Patient resting comfortably.  Do not see PT eval yet may be due to current bedrest status??  Objective: Vital signs in last 24 hours: Temp:  [98.1 F (36.7 C)-99.4 F (37.4 C)] 99.4 F (37.4 C) (09/04 0647) Pulse Rate:  [81-92] 81 (09/04 0647) Resp:  [18-20] 20 (09/04 0647) BP: (117-137)/(67-73) 117/67 (09/04 0647) SpO2:  [89 %-99 %] 98 % (09/04 0647) Weight:  [73.8 kg] 73.8 kg (09/04 0643)  Intake/Output from previous day: 09/03 0701 - 09/04 0700 In: 1544 [P.O.:360; NG/GT:829; IV Piggyback:100] Out: 1450 [Urine:1450] Intake/Output this shift: Total I/O In: 120 [P.O.:120] Out: -   Recent Labs    02/19/20 1026 02/19/20 2029 02/20/20 0542 02/20/20 1919  HGB 7.6* 8.6* 8.1* 8.7*   Recent Labs    02/20/20 0542 02/20/20 1919  WBC 10.1 9.9  RBC 2.83* 3.02*  HCT 25.3* 27.2*  PLT 143* 177   Recent Labs    02/20/20 0542 02/20/20 1919  NA 138  --   K 4.1  --   CL 109  --   CO2 24  --   BUN 16  --   CREATININE 0.61 0.65  GLUCOSE 105*  --   CALCIUM 7.7*  --    No results for input(s): LABPT, INR in the last 72 hours.  Incision: dressing C/D/I   Assessment/Plan: 4 days post op R hip hemi    Spoke with RN, she spoke with cards this AM said ok to change dsg no known reason to still have bedrest order in chart will d/c so PT can evaluate  Weightbearing:WBATRLE, continue posterior hip precautions Insicional and dressing care:Reinforce dressings as needed VTE prophylaxis:Lovenox 40mg  qdx 30 days Pain control:continue current regimen Follow - up plan:2 weekswith Dr. Mardelle Matte Dispo: pending PT eval??   Chriss Czar 02/22/2020, 10:23 AM

## 2020-02-22 NOTE — Progress Notes (Signed)
PROGRESS NOTE    Casey Richard  WIO:[id number] DOB: [birth year] DOA: 02/17/2020 PCP: Benito Mccreedy, MD   Brief Narrative:  This 64 year old male with history of V. 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 Danny Lawless from orthopedic was consulted,and patient is being admitted to [facility] for further EP cardiology evaluation prior to undergoing operative intervention.  Patient underwent right hip hemiarthroplasty on 8/31 by orthopedics, postop he had hypotension, acute blood loss anemia likely perioperative blood loss received IV fluid bolus and 1 unit PRBC and stabilized. 02/20/20-PM generator changed at [facility] 02/21/20-  trach decannulation by PCCM.   Assessment & Plan:   Principal Problem:   Closed displaced fracture of right femoral neck (HCC) Active Problems:   Complete heart block [facility])   Pacemaker battery depletion   Dysphagia   Anastomotic stricture of urinary tract   Essential hypertension  Right femoral neck fracture status post fall: s/p right hip hemiarthroplasty  8/31, appreciate orthopedic inputs.  Continue WBAT RLE, reinforce dressing as needed, Lovenox 40 mg daily x 30 days for DVT prophylaxis, continue pain control and follow-up 2 weeks with with Dr. Mardelle Matte. Continue PT OT and SNF plan  Hypotension : suspect hypovolemia, also acute blood loss anemia peri operative .  Improved after IV fluid bolus and blood transfusion 1 unit.  Meds on hold. Will resumes once BP improves.  Acute blood loss anemia, perioperative: Estimated blood loss 300 ml in surgery.  H&H trended down to 7.6 g received 1  unit PRBC 9/1- Hb at 8.7 gm, Recheck H and H.  History of complete heart block/V. fib arrest/status post pacemaker . He was scheduled to undergo generator change next week on outpatient setting and EP has seen  S/p gen change by cardio at [facility] 02/20/20.  Plan for dressing change later today by cardiology.  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  Dysphagia/risk of aspirationresulting from cranial nerve injury following schwannoma surgery in 2021, is on bolus PEG tube feeding at home and also takes by mouth.  Continue dietitian on board.  History of cystic schwannoma requiring craniotomy/tracheostomy.  Tracheostomy removed yesterday.  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  Continue Foley in place  Essential hypertension-blood pressure medication on hold due to hypotension yesterday.  GERD- keep on PPI.   DVT prophylaxis:  Lovenox Code Status:Full  Family Communication:  Plan of care discussed with patient in detail. Disposition Plan:   Dispo: The patient is from: SNF  Anticipated d/c is to: SNF  Anticipated d/c date is: 2 days once okay with PCCM/Ortho and cadrio  Patient currently is not medically stable to d/c.   Consultants:   Cardiology, orthopedics, PCCM.  Procedures: Antimicrobials: Anti-infectives (From admission, onward)   Start     Dose/Rate Route Frequency Ordered Stop   02/20/20 2000  ceFAZolin (ANCEF) IVPB 1 g/50 mL premix        1 g 100 mL/hr over 30 Minutes Intravenous Every 6 hours 02/20/20 1753 02/21/20 1659   02/20/20 1400  ceFAZolin (ANCEF) IVPB 2g/100 mL premix        2  g 200 mL/hr over 30 Minutes Intravenous On call 02/20/20 0401 02/20/20 1432   02/20/20 1400  gentamicin (GARAMYCIN) 80 mg in sodium chloride 0.9 % 500 mL irrigation        80 mg Irrigation  On call 02/20/20 0401 02/20/20 1433   02/20/20 0500  gentamicin (GARAMYCIN) 80 mg in sodium chloride 0.9 % 500 mL irrigation  Status:  Discontinued        80 mg Irrigation On call 02/20/20 0400 02/20/20 0401   02/20/20 0500  ceFAZolin (ANCEF) IVPB 2g/100 mL premix  Status:  Discontinued        2 g 200 mL/hr over 30 Minutes Intravenous On call 02/20/20 0400 02/20/20 0401   02/18/20 2032  ceFAZolin (ANCEF) 2-4 GM/100ML-% IVPB       Note to Pharmacy: Benjamin Stain   : cabinet override      02/18/20 2032 02/18/20 2040   02/18/20 2025  ceFAZolin (ANCEF) 1-4 GM/50ML-% IVPB  Status:  Discontinued       Note to Pharmacy: Benjamin Stain   : cabinet override      02/18/20 2025 02/18/20 2032   02/18/20 2015  ceFAZolin (ANCEF) IVPB 2g/100 mL premix        2 g 200 mL/hr over 30 Minutes Intravenous Every 6 hours 02/18/20 2007 02/19/20 0255   02/18/20 1100  ceFAZolin (ANCEF) IVPB 2g/100 mL premix        2 g 200 mL/hr over 30 Minutes Intravenous On call to O.R. 02/17/20 2315 02/18/20 1406     Subjective: Patient was seen and examined at bedside.  Overnight events noted.  He is alert, awake ,oriented ,not in any acute distress.  He had decannulation completed yesterday.  Objective: Vitals:   02/21/20 2015 02/22/20 0643 02/22/20 0647 02/22/20 1303  BP: 122/71  117/67 125/75  Pulse: 81  81 86  Resp: 18  20 18   Temp: 98.1 F (36.7 C)  99.4 F (37.4 C) 98.2 F (36.8 C)  TempSrc: Oral  Oral   SpO2: (!) 89%  98% 97%  Weight:  73.8 kg    Height:        Intake/Output Summary (Last 24 hours) at 02/22/2020 1616 Last data filed at 02/22/2020 1400 Gross per 24 hour  Intake 2491 ml  Output 850 ml  Net 1641 ml   Filed Weights   02/18/20 2108 02/20/20 0500 02/22/20 0643  Weight: 70.3 kg 78.8 kg 73.8 kg    Examination:  General exam: Appears calm and comfortable  Respiratory system: Clear to auscultation. Respiratory effort normal. Cardiovascular system: S1 & S2 heard, RRR. No JVD, murmurs, rubs,  gallops or clicks. No pedal edema. Gastrointestinal system: Abdomen is nondistended, soft and nontender. No organomegaly or masses felt. Normal bowel sounds heard. Peg tube noted. Central nervous system: Alert and oriented. No focal neurological deficits. Extremities:  No leg edema, peripheral pulses palpable. Skin: No rashes, lesions or ulcers Psychiatry: Judgement and insight appear normal. Mood & affect appropriate.     Data Reviewed: I have personally reviewed following labs and imaging studies  CBC: Recent Labs  Lab 02/17/20 2147 02/17/20 2147 02/18/20 0404 02/18/20 0404 02/19/20 0526 02/19/20 1026 02/19/20 2029 02/20/20 0542 02/20/20 1919  WBC 10.8*  --  10.9*  --  12.2*  --   --  10.1 9.9  NEUTROABS  --   --  7.9*  --   --   --   --   --   --   HGB 12.2*   < >  11.6*   < > 8.4* 7.6* 8.6* 8.1* 8.7*  HCT 38.5*   < > 36.0*   < > 26.6* 24.3* 27.0* 25.3* 27.2*  MCV 88.3  --  88.5  --  90.2  --   --  89.4 90.1  PLT 182  --  174  --  144*  --   --  143* 177   < > = values in this interval not displayed.   Basic Metabolic Panel: Recent Labs  Lab 02/17/20 2147 02/18/20 0404 02/19/20 0526 02/20/20 0542 02/20/20 1919  NA 139 140 137 138  --   K 3.9 3.8 4.3 4.1  --   CL 102 104 105 109  --   CO2 27 27 25 24   --   GLUCOSE 121* 110* 161* 105*  --   BUN 22 17 17 16   --   CREATININE 0.74 0.65 0.81 0.61 0.65  CALCIUM 8.9 8.6* 8.1* 7.7*  --   MG  --  2.0  --   --   --    GFR: Estimated Creatinine Clearance: 95.1 mL/min (by C-G formula based on SCr of 0.65 mg/dL). Liver Function Tests: Recent Labs  Lab 02/17/20 2147 02/18/20 0404  AST 20 19  ALT 30 28  ALKPHOS 77 71  BILITOT 0.7 0.6  PROT 7.7 7.1  ALBUMIN 4.2 3.8   No results for input(s): LIPASE, AMYLASE in the last 168 hours. No results for input(s): AMMONIA in the last 168 hours. Coagulation Profile: Recent Labs  Lab 02/18/20 0404  INR 1.1   Cardiac Enzymes: No results for input(s): CKTOTAL, CKMB,  CKMBINDEX, TROPONINI in the last 168 hours. BNP (last 3 results) No results for input(s): PROBNP in the last 8760 hours. HbA1C: No results for input(s): HGBA1C in the last 72 hours. CBG: Recent Labs  Lab 02/21/20 2007 02/22/20 0045 02/22/20 0634 02/22/20 0812 02/22/20 1108  GLUCAP 105* 139* 122* 122* 129*   Lipid Profile: No results for input(s): CHOL, HDL, LDLCALC, TRIG, CHOLHDL, LDLDIRECT in the last 72 hours. Thyroid Function Tests: No results for input(s): TSH, T4TOTAL, FREET4, T3FREE, THYROIDAB in the last 72 hours. Anemia Panel: No results for input(s): VITAMINB12, FOLATE, FERRITIN, TIBC, IRON, RETICCTPCT in the last 72 hours. Sepsis Labs: No results for input(s): PROCALCITON, LATICACIDVEN in the last 168 hours.  Recent Results (from the past 240 hour(s))  SARS Coronavirus 2 by RT PCR (hospital order, performed in [facility] hospital lab) Nasopharyngeal Nasopharyngeal Swab     Status: None   Collection Time: 02/17/20  9:47 PM   Specimen: Nasopharyngeal Swab  Result Value Ref Range Status   SARS Coronavirus 2 NEGATIVE NEGATIVE Final    Comment: (NOTE) SARS-CoV-2 target nucleic acids are NOT DETECTED.  The SARS-CoV-2 RNA is generally detectable in upper and lower respiratory specimens during the acute phase of infection. The lowest concentration of SARS-CoV-2 viral copies this assay can detect is 250 copies / mL. A negative result does not preclude SARS-CoV-2 infection and should not be used as the sole basis for treatment or other patient management decisions.  A negative result may occur with improper specimen collection / handling, submission of specimen other than nasopharyngeal swab, presence of viral mutation(s) within the areas targeted by this assay, and inadequate number of viral copies (<250 copies / mL). A negative result must be combined with clinical observations, patient history, and epidemiological information.  Fact Sheet for Patients:    StrictlyIdeas.no  Fact Sheet for Healthcare Providers: BankingDealers.co.za  This test  is not yet approved or  cleared by the [place] and has been authorized for detection and/or diagnosis of SARS-CoV-2 by FDA under an Emergency Use Authorization (EUA).  This EUA will remain in effect (meaning this test can be used) for the duration of the COVID-19 declaration under Section 564(b)(1) of the Act, 21 U.S.C. section 360bbb-3(b)(1), unless the authorization is terminated or revoked sooner.  Performed at [facility], [place] [address] [place], [place] [address]   MRSA PCR Screening     Status: None   Collection Time: 02/18/20  9:14 PM   Specimen: Nasal Mucosa; Nasopharyngeal  Result Value Ref Range Status   MRSA by PCR NEGATIVE NEGATIVE Final    Comment:        The GeneXpert MRSA Assay (FDA approved for NASAL specimens only), is one component of a comprehensive MRSA colonization surveillance program. It is not intended to diagnose MRSA infection nor to guide or monitor treatment for MRSA infections. Performed at [facility], [place] [address], [place], [place] [address]   Surgical PCR screen     Status: None   Collection Time: 02/20/20  4:38 AM   Specimen: Nasal Mucosa; Nasal Swab  Result Value Ref Range Status   MRSA, PCR NEGATIVE NEGATIVE Final   Staphylococcus aureus NEGATIVE NEGATIVE Final    Comment: (NOTE) The Xpert SA Assay (FDA approved for NASAL specimens in patients 65 years of age and older), is one component of a comprehensive surveillance program. It is not intended to diagnose infection nor to guide or monitor treatment. Performed at [facility], [place] [address] [place], [place] [address]      Radiology Studies: No results found.  Scheduled Meds: . artificial tears   Right Eye Q4H  . bethanechol  10 mg Per Tube TID  . Chlorhexidine Gluconate  Cloth  6 each Topical Daily  . docusate  100 mg Per Tube Daily  . enoxaparin (LOVENOX) injection  40 mg Subcutaneous Q24H  . feeding supplement (ENSURE ENLIVE)  237 mL Oral Q24H  . feeding supplement (OSMOLITE 1.5 CAL)  355 mL Per Tube QID  . feeding supplement (PROSource TF)  45 mL Per Tube BID  . pantoprazole sodium  40 mg Per Tube Daily  . povidone-iodine  2 application Topical Once  . QUEtiapine  25 mg Per Tube QHS   Continuous Infusions: . methocarbamol (ROBAXIN) IV       LOS: 4 days    Time spent: 35 mins    Tkeya Stencil, MD [facility]   If 7PM-7AM, please contact night-coverage

## 2020-02-22 NOTE — NC FL2 (Signed)
[place] MEDICAID FL2 LEVEL OF CARE SCREENING TOOL     IDENTIFICATION  Patient Name: Casey Richard Birthdate: [birth year] Sex: male Admission Date (Current Location): 02/17/2020  [facility] and [place] Number:  Herbalist and Address:  [employer],  [place], [place]      Provider Number: [id number]  Attending Physician Name and Address:  Shawna Clamp, MD  Relative Name and Phone Number:  Hunter, Bachar   [phone]    Current Level of Care: Hospital Recommended Level of Care: [place] Prior Approval Number:    Date Approved/Denied:   PASRR Number: [id number]  Discharge Plan: SNF    Current Diagnoses: Patient Active Problem List   Diagnosis Date Noted  . Dysphagia 02/18/2020  . Closed displaced fracture of right femoral neck ([place]) 02/18/2020  . 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 Essential hypertension 02/18/2020  . Hypoxia   . Tracheostomy status ([place])   . Ventilator dependent ([place])   . Protein-calorie malnutrition, severe 10/10/2019  . Steroid-induced hyperglycemia   . Leukocytosis   . Acute blood loss anemia   . Brain tumor ([place]) 10/07/2019  . Brain mass 09/27/2019  . Gait instability 09/27/2019  . Paroxysmal atrial tachycardia ([place]) 10/27/2015  . Nonsustained ventricular tachycardia ([place]) 02/07/2014  . CHB (complete heart block) ([place])   . Memory loss, short term   . Pacemaker battery depletion 07/15/2011  . NSTEMI (non-ST elevated myocardial infarction) ([place]) 07/10/2011  . Ventricular fibrillation ([place]) 07/09/2011  . 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 Complete heart block ([place]) 07/09/2011    Orientation RESPIRATION BLADDER Height & Weight     Self, Time, Situation, Place  Normal Continent Weight: 162 lb 11.2 oz (73.8 kg) Height:  5\' 10"  (177.8 cm)  BEHAVIORAL SYMPTOMS/MOOD NEUROLOGICAL BOWEL NUTRITION STATUS      Continent Diet (Regular diet)   AMBULATORY STATUS COMMUNICATION OF NEEDS Skin   Limited Assist Verbally Surgical wounds                       Personal Care Assistance Level of Assistance  Bathing, Dressing, Feeding Bathing Assistance: Limited assistance Feeding assistance: Independent Dressing Assistance: Limited assistance     Functional Limitations Info  Sight, Hearing, Speech Sight Info: Adequate Hearing Info: Adequate Speech Info: Adequate    SPECIAL CARE FACTORS FREQUENCY  PT (By licensed PT), OT (By licensed OT)     PT Frequency: Minimum 5x a week OT Frequency: Minimum 5x a week            Contractures Contractures Info: Not present    Additional Factors Info  Code Status, Allergies, Psychotropic Code Status Info: Full Code Allergies Info: NKA Psychotropic Info: QUEtiapine (SEROQUEL) tablet 25 mg         Current Medications (02/22/2020):  This is the current hospital active medication list Current Facility-Administered Medications  Medication Dose Route Frequency Provider Last Rate Last Admin  . acetaminophen (TYLENOL) tablet 325-650 mg  325-650 mg Oral Q4H PRN Croitoru, Mihai, MD      . artificial tears (LACRILUBE) ophthalmic ointment   Right Eye Q4H Ventura Bruns, [place]   Given at 02/22/20 [phone]  . bethanechol (URECHOLINE) tablet 10 mg  10 mg Per Tube TID Merlene Pulling K, PA-C   10 mg at 02/22/20 0844  . Chlorhexidine Gluconate Cloth 2 % PADS 6 each  6 each Topical Daily Shalhoub, Sherryll Burger, MD   6 each at 02/22/20  0848  . docusate (COLACE) 50 MG/5ML liquid 100 mg  100 mg Per Tube Daily Merlene Pulling K, PA-C   100 mg at 02/22/20 0844  . enoxaparin (LOVENOX) injection 40 mg  40 mg Subcutaneous Q24H Croitoru, Mihai, MD   40 mg at 02/22/20 0843  . feeding supplement (ENSURE ENLIVE) (ENSURE ENLIVE) liquid 237 mL  237 mL Oral Q24H Merlene Pulling K, PA-C   237 mL at 02/21/20 1321  . feeding supplement (OSMOLITE 1.5 CAL) liquid 355 mL  355 mL Per Tube QID Merlene Pulling K, PA-C   45 mL at  02/22/20 0845  . feeding supplement (PROSource TF) liquid 45 mL  45 mL Per Tube BID Vernelle Emerald, MD   45 mL at 02/22/20 0846  . guaiFENesin (ROBITUSSIN) 100 MG/5ML solution 200 mg  200 mg Oral TID PRN [place] Bruns, PA-C      . HYDROcodone-acetaminophen (NORCO) 7.5-325 MG per tablet 1-2 tablet  1-2 tablet Oral Q4H PRN Merlene Pulling K, PA-C   2 tablet at 02/21/20 1700  . HYDROcodone-acetaminophen (NORCO/VICODIN) 5-325 MG per tablet 1-2 tablet  1-2 tablet Oral Q4H PRN Merlene Pulling K, PA-C   2 tablet at 02/22/20 0630  . ipratropium-albuterol (DUONEB) 0.5-2.5 (3) MG/3ML nebulizer solution 3 mL  3 mL Nebulization Q6H PRN Merlene Pulling K, PA-C      . methocarbamol (ROBAXIN) tablet 500 mg  500 mg Oral Q6H PRN Merlene Pulling K, PA-C   500 mg at 02/22/20 0844   Or  . methocarbamol (ROBAXIN) 500 mg in dextrose 5 % 50 mL IVPB  500 mg Intravenous Q6H PRN Merlene Pulling K, PA-C      . morphine 4 MG/ML injection 0.52-1 mg  0.52-1 mg Intravenous Q2H PRN Merlene Pulling K, PA-C   1 mg at 02/20/20 1706  . ondansetron (ZOFRAN) tablet 4 mg  4 mg Oral Q6H PRN Merlene Pulling K, PA-C       Or  . ondansetron [facility]) injection 4 mg  4 mg Intravenous Q6H PRN Merlene Pulling K, PA-C      . pantoprazole sodium (PROTONIX) 40 mg/20 mL oral suspension 40 mg  40 mg Per Tube Daily Merlene Pulling K, PA-C   40 mg at 02/21/20 1051  . polyethylene glycol (MIRALAX / GLYCOLAX) packet 17 g  17 g Oral Daily PRN Merlene Pulling K, PA-C      . povidone-iodine 10 % swab 2 application  2 application Topical Once Brown, Blaine K, PA-C      . QUEtiapine (SEROQUEL) tablet 25 mg  25 mg Per Tube QHS Merlene Pulling K, PA-C   25 mg at 02/21/20 2152     Discharge Medications: Please see discharge summary for a list of discharge medications.  Relevant Imaging Results:  Relevant Lab Results:   Additional Information Patient no longer has Trach patient SSN [id number]  Ross Ludwig, LCSW

## 2020-02-22 NOTE — TOC Progression Note (Signed)
Transition of Care [facility]) - Progression Note    Patient Details  Name: TAYDEN NICHELSON MRN: [id number] Date of Birth: 06-08-1956  [facility] CM/SW Contact  Ross Ludwig, [place] Phone Number: 02/22/2020, 12:39 PM  Clinical Narrative:     CSW spoke to patient regarding different options for SNF placement other then returning to [facility].  Per patient he would like to see what other options [place] for SNF placement.  CSW explained process of trying to find other options.  Per patient he does have Medicaid now, however it is not currently showing in the system.  Patient gave CSW permission to begin bed search in [facility], awaiting for bed offers.  Per patient he said to also speak to his brother and give him options for SNF placement.  CSW to continue to follow patient's progress throughout discharge planning.          Expected Discharge Plan: [place] Barriers to Discharge: Continued Medical Work up  Expected Discharge Plan and Services Expected Discharge Plan: [place]   Discharge Planning Services: CM Consult Post Acute Care Choice: [place] Living arrangements for the past 2 months:  (LTC)                                       Social Determinants of Health (SDOH) Interventions    Readmission Risk Interventions No flowsheet data found.

## 2020-02-22 NOTE — Progress Notes (Signed)
Progress Note  Patient Name: Casey Richard Date of Encounter: 02/22/2020  [facility] Cardiologist: Sanda Klein, MD   Subjective   Denies shortness of breath or chest pain  Inpatient Medications    Scheduled Meds: . artificial tears   Right Eye Q4H  . bethanechol  10 mg Per Tube TID  . Chlorhexidine Gluconate Cloth  6 each Topical Daily  . docusate  100 mg Per Tube Daily  . enoxaparin (LOVENOX) injection  40 mg Subcutaneous Q24H  . feeding supplement (ENSURE ENLIVE)  237 mL Oral Q24H  . feeding supplement (OSMOLITE 1.5 CAL)  355 mL Per Tube QID  . feeding supplement (PROSource TF)  45 mL Per Tube BID  . pantoprazole sodium  40 mg Per Tube Daily  . povidone-iodine  2 application Topical Once  . QUEtiapine  25 mg Per Tube QHS   Continuous Infusions: . methocarbamol (ROBAXIN) IV     PRN Meds: acetaminophen, guaiFENesin, HYDROcodone-acetaminophen, HYDROcodone-acetaminophen, ipratropium-albuterol, methocarbamol **OR** methocarbamol (ROBAXIN) IV, morphine injection, ondansetron **OR** ondansetron (ZOFRAN) IV, polyethylene glycol   Vital Signs    Vitals:   02/21/20 1820 02/21/20 2015 02/22/20 0643 02/22/20 0647  BP: 119/69 122/71  117/67  Pulse: 91 81  81  Resp: 20 18  20   Temp: 98.5 F (36.9 C) 98.1 F (36.7 C)  99.4 F (37.4 C)  TempSrc: Oral Oral  Oral  SpO2: 97% (!) 89%  98%  Weight:   73.8 kg   Height:        Intake/Output Summary (Last 24 hours) at 02/22/2020 0914 Last data filed at 02/22/2020 0816 Gross per 24 hour  Intake 1664 ml  Output 1450 ml  Net 214 ml   Last 3 Weights 02/22/2020 02/20/2020 02/18/2020  Weight (lbs) 162 lb 11.2 oz 173 lb 11.6 oz 155 lb  Weight (kg) 73.8 kg 78.8 kg 70.308 kg      Telemetry    SR with V pacing, rate 70s - Personally Reviewed  ECG    No new ECG - Personally Reviewed  Physical Exam   GEN: No acute distress.  Eye patch in place on Rt Neck: No JVD, trach in place Cardiac: RRR, no murmurs, rubs, or gallops.   Respiratory: Clear to auscultation bilaterally. ant GI: Soft, nontender, non-distended  MS: No edema; No deformity. Neuro:  Nonfocal  Psych: Normal affect  Pacer site no acute swelling mild palpated under dressing.  Mildly tender  Labs    High Sensitivity Troponin:  No results for input(s): TROPONINIHS in the last 720 hours.    Chemistry Recent Labs  Lab 02/17/20 2147 02/17/20 2147 02/18/20 0404 02/18/20 0404 02/19/20 0526 02/20/20 0542 02/20/20 1919  NA 139   < > 140  --  137 138  --   K 3.9   < > 3.8  --  4.3 4.1  --   CL 102   < > 104  --  105 109  --   CO2 27   < > 27  --  25 24  --   GLUCOSE 121*   < > 110*  --  161* 105*  --   BUN 22   < > 17  --  17 16  --   CREATININE 0.74   < > 0.65   < > 0.81 0.61 0.65  CALCIUM 8.9   < > 8.6*  --  8.1* 7.7*  --   PROT 7.7  --  7.1  --   --   --   --  ALBUMIN 4.2  --  3.8  --   --   --   --   AST 20  --  19  --   --   --   --   ALT 30  --  28  --   --   --   --   ALKPHOS 77  --  71  --   --   --   --   BILITOT 0.7  --  0.6  --   --   --   --   GFRNONAA >60   < > >60   < > >60 >60 >60  GFRAA >60   < > >60   < > >60 >60 >60  ANIONGAP 10   < > 9  --  7 5  --    < > = values in this interval not displayed.     Hematology Recent Labs  Lab 02/19/20 0526 02/19/20 1026 02/19/20 2029 02/20/20 0542 02/20/20 1919  WBC 12.2*  --   --  10.1 9.9  RBC 2.95*  --   --  2.83* 3.02*  HGB 8.4*   < > 8.6* 8.1* 8.7*  HCT 26.6*   < > 27.0* 25.3* 27.2*  MCV 90.2  --   --  89.4 90.1  MCH 28.5  --   --  28.6 28.8  MCHC 31.6  --   --  32.0 32.0  RDW 14.6  --   --  15.2 15.1  PLT 144*  --   --  143* 177   < > = values in this interval not displayed.    BNPNo results for input(s): BNP, PROBNP in the last 168 hours.   DDimer No results for input(s): DDIMER in the last 168 hours.   Radiology    EP PPM/ICD IMPLANT  Result Date: 02/20/2020 1. Dual chamber pacemaker generator changeout with Aegis pouch 2. Light sedation Reason for  procedure: 1. Device generator at elective replacement interval 2. Complete heart block Procedure performed by: Sanda Klein, MD Complications: None Estimated blood loss: <5 mL Medications administered during procedure: Ancef 2 g intravenously,  lidocaine 1% 30 mL locally, fentanyl 25 mcg intravenously Device details: New Generator Medtronic Azure XT DR model number [id number], serial number [id number] Right atrial lead (chronic) Medtronic , model number [id number], serial number [id number] (implanted 07/15/2011) Right ventricular lead (chronic)  Medtronic, model number [id number], serial number [id number] (implanted 07/15/2011) Explanted generator Medtronic Revo,  model number [id number], serial number  [id number] (implanted 07/15/2011) Procedure details: After the risks and benefits of the procedure were discussed the patient provided informed consent. She was brought to the cardiac catheter lab in the fasting state. The patient was prepped and draped in usual sterile fashion. Local anesthesia with 1% lidocaine was administered to to the left infraclavicular area. A 5-6cm horizontal incision was made parallel with and 2-3 cm caudal to the right clavicle, in the area of an old scar.  Using minimal electrocautery and mostly sharp and blunt dissection the prepectoral pocket was opened carefully to avoid injury to the loops of chronic leads. Extensive dissection was not necessary. The device was explanted. The pocket was carefully inspected for hemostasis and flushed with copious amounts of antibiotic solution. The leads were disconnected from the old generator and the new generator was connected to the chronic leads, with appropriate pacing noted. Testing of the lead parameters via telemetry showed excellent values. T The entire system was then inserted in the pre-soaked Aegis pouch  and carefully inserted in the pocket with care been taking that the leads and device assumed a comfortable position without pressure on the incision.  Great care was taken that the leads be located deep to the generator. The pocket was then closed in layers using 2 layers of 2-0 Vicryl, one layer of 3-0 Vicryl,  cutaneous steristrips, after which a sterile dressing was applied. At the end of the procedure the following lead parameters were encountered: Right atrial lead sensed P waves 3.4 mV, impedance 399 ohms, threshold 0.5 at 0.4 ms pulse width. Right ventricular lead sensed R waves  None, impedance 418 ohms, threshold 0.5 at 0.4 ms pulse width.   Cardiac Studies   02/20/20 Gen change New Generator Medtronic Azure XT DR model number [id number], serial number [id number] Right atrial lead (chronic) Medtronic , model number [id number], serial number [id number] (implanted 07/15/2011) Right ventricular lead (chronic)  Medtronic, model number [id number], serial number [id number] (implanted 07/15/2011)  Explanted generator Medtronic Revo,  model number [id number], serial number  [id number] (implanted 07/15/2011)   Echo 07/13/2011 Left ventricle: Systolic function was normal. Wall motion  was normal; there were no regional wall motion  abnormalities.  - Aortic valve: No evidence of vegetation.  - Mitral valve: No evidence of vegetation.  - Left atrium: No evidence of thrombus in the atrial cavity  or appendage.  - Right atrium: No evidence of thrombus in the atrial cavity  or appendage.  - Atrial septum: There was a valve-incompetent patent  foramen ovale. There was redundancy of the septum, with  borderline criteria for aneurysm.  - Tricuspid valve: No evidence of vegetation.  - Pulmonic valve: No evidence of vegetation.  Patient Profile     64 y.o. male with PMH of CHB and NSVT s/p MDT PPM 2013, CKD with PMH of urethral stricture s/p suprapubic catheter placement, h/o aspirin PNA with trach/PEG tube placement presented for R hip fracture after tripping voer his foley catheter. Pt post op now and s/p gen change on PPM POD #1  Assessment & Plan     S/p PPM gen change for ERI.  Hx CHB.  Site stable.  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

## 2020-02-22 NOTE — Progress Notes (Signed)
Physical Therapy Treatment Patient Details Name: Casey Richard MRN: [id number] DOB: Jan 27, 1956 Today's Date: 02/22/2020    History of Present Illness 63 y.o. male with PMH craniotomy/resection of cystic schwannoma in APril 2021, CHB, NSVT s/p MDT PPM 2013, urethral stricture s/p suprapubic catheter placement, h/o PNA with trach/PEG tube placement presented for R hip fracture after tripping over his foley catheter. Pt underwent Rt hemiarthroplasty on 8/31, is now s/p gen change on PPM on 9/2, and has had trach removed on 9/3.    PT Comments    POD # 4  Assisted OOB.  General bed mobility comments: cues to maintain posterior hip precautions, assist for Rt LE mobility and increased time. General transfer comment: 25% VC's on proper hand placement and to avoid hip flex > 90 degrees assisted from elevated bed onto EVA walker.  Unsteady with mild lateral instability. General Gait Details: Used EVA walker for increased support and to increase amb distance as well as upright time.  Heavy lean on B Platform EVA walker with assist to properly advance.  Difficulty self advancing R LE first 5 to 8 steps due to pain and "stiffness".  Step to gait with decreased stance time R LE.  Increased assist with turns and back gait to recliner.  HIGH FALL RISK. Pt will need ST Rehab at SNF.   Follow Up Recommendations  SNF     Equipment Recommendations       Recommendations for Other Services       Precautions / Restrictions Precautions Precautions: Fall;Posterior Hip;ICD/Pacemaker Precaution Comments: pt able to recall 3/3 THP Restrictions Weight Bearing Restrictions: No RLE Weight Bearing: Weight bearing as tolerated    Mobility  Bed Mobility Overal bed mobility: Needs Assistance Bed Mobility: Supine to Sit     Supine to sit: Mod assist;HOB elevated     General bed mobility comments: cues to maintain posterior hip precautions, assist for Rt LE mobility and increased time.  Transfers Overall  transfer level: Needs assistance Equipment used: Rolling walker (2 wheeled);Bilateral platform walker (EVA walker) Transfers: Sit to/from [facility] Sit to Stand: Mod assist;From elevated surface;Min assist;+2 safety/equipment Stand pivot transfers: Mod assist;From elevated surface;+2 safety/equipment       General transfer comment: 25% VC's on proper hand placement and to avoid hip flex > 90 degrees assisted from elevated bed onto EVA walker.  Unsteady with mild lateral instability.  Ambulation/Gait   Gait Distance (Feet): 28 Feet (14 feet x 2 one standing rest break) Assistive device: Bilateral platform walker (EVA walker) Gait Pattern/deviations: Step-to pattern;Decreased stance time - right;Drifts right/left;Narrow base of support Gait velocity: decreased   General Gait Details: Used EVA walker for increased support and to increase amb distance as well as upright time.  Heavy lean on B Platform EVA walker with assist to properly advance.  Difficulty self advancing R LE first 5 to 8 steps due to pain and "stiffness".  Step to gait with decreased stance time R LE.  Increased assist with turns and back gait to recliner.  HIGH FALL RISK.   Stairs             Wheelchair Mobility    Modified Rankin (Stroke Patients Only)       Balance                                            Cognition Arousal/Alertness:  Awake/alert Behavior During Therapy: WFL for tasks assessed/performed Overall Cognitive Status: Within Functional Limits for tasks assessed                                 General Comments: AxO x 3 very motivated      Exercises  10 reps LAQ's with 5 sec hold 10 reps B knee presses with 5 sec hold    General Comments        Pertinent Vitals/Pain Pain Assessment: 0-10 Pain Score: 7  Pain Location: Rt hip Pain Descriptors / Indicators: Aching;Discomfort;Operative site guarding Pain Intervention(s): Monitored  during session;Premedicated before session;Repositioned;Ice applied    Home Living                      Prior Function            PT Goals (current goals can now be found in the care plan section) Progress towards PT goals: Progressing toward goals    Frequency    Min 3X/week      PT Plan Current plan remains appropriate    Co-evaluation              AM-PAC PT "6 Clicks" Mobility   Outcome Measure  Help needed turning from your back to your side while in a flat bed without using bedrails?: A Lot Help needed moving from lying on your back to sitting on the side of a flat bed without using bedrails?: A Lot Help needed moving to and from a bed to a chair (including a wheelchair)?: A Lot Help needed standing up from a chair using your arms (e.g., wheelchair or bedside chair)?: A Lot Help needed to walk in hospital room?: A Lot Help needed climbing 3-5 steps with a railing? : Total 6 Click Score: 11    End of Session Equipment Utilized During Treatment: Gait belt Activity Tolerance: Patient tolerated treatment well Patient left: in chair;with call bell/phone within reach;with chair alarm set Nurse Communication: Mobility status PT Visit Diagnosis: Muscle weakness (generalized) (M62.81);Difficulty in walking, not elsewhere classified (R26.2)     Time: 1410-1440 PT Time Calculation (min) (ACUTE ONLY): 30 min  Charges:  $Gait Training: 8-22 mins $Therapeutic Activity: 8-22 mins                     Rica Koyanagi  PTA Acute  Rehabilitation Services Pager      [phone] Office      [phone]

## 2020-02-23 LAB — CBC
HCT: 25.1 % — ABNORMAL LOW (ref 39.0–52.0)
Hemoglobin: 7.7 g/dL — ABNORMAL LOW (ref 13.0–17.0)
MCH: 27.8 pg (ref 26.0–34.0)
MCHC: 30.7 g/dL (ref 30.0–36.0)
MCV: 90.6 fL (ref 80.0–100.0)
Platelets: 231 10*3/uL (ref 150–400)
RBC: 2.77 MIL/uL — ABNORMAL LOW (ref 4.22–5.81)
RDW: 14.6 % (ref 11.5–15.5)
WBC: 8.1 10*3/uL (ref 4.0–10.5)
nRBC: 0 % (ref 0.0–0.2)

## 2020-02-23 LAB — HEMOGLOBIN AND HEMATOCRIT, BLOOD
HCT: 24.5 % — ABNORMAL LOW (ref 39.0–52.0)
HCT: 29 % — ABNORMAL LOW (ref 39.0–52.0)
Hemoglobin: 7.7 g/dL — ABNORMAL LOW (ref 13.0–17.0)
Hemoglobin: 9 g/dL — ABNORMAL LOW (ref 13.0–17.0)

## 2020-02-23 LAB — GLUCOSE, CAPILLARY
Glucose-Capillary: 106 mg/dL — ABNORMAL HIGH (ref 70–99)
Glucose-Capillary: 107 mg/dL — ABNORMAL HIGH (ref 70–99)
Glucose-Capillary: 136 mg/dL — ABNORMAL HIGH (ref 70–99)
Glucose-Capillary: 136 mg/dL — ABNORMAL HIGH (ref 70–99)
Glucose-Capillary: 146 mg/dL — ABNORMAL HIGH (ref 70–99)
Glucose-Capillary: 95 mg/dL (ref 70–99)

## 2020-02-23 LAB — BASIC METABOLIC PANEL
Anion gap: 7 (ref 5–15)
BUN: 16 mg/dL (ref 8–23)
CO2: 30 mmol/L (ref 22–32)
Calcium: 8.4 mg/dL — ABNORMAL LOW (ref 8.9–10.3)
Chloride: 103 mmol/L (ref 98–111)
Creatinine, Ser: 0.59 mg/dL — ABNORMAL LOW (ref 0.61–1.24)
GFR calc Af Amer: >60 mL/min (ref >60)
GFR calc non Af Amer: >60 mL/min (ref >60)
Glucose, Bld: 103 mg/dL — ABNORMAL HIGH (ref 70–99)
Potassium: 4.3 mmol/L (ref 3.5–5.1)
Sodium: 140 mmol/L (ref 135–145)

## 2020-02-23 LAB — PREPARE RBC (CROSSMATCH)

## 2020-02-23 MED ORDER — SODIUM CHLORIDE 0.9% IV SOLUTION: Freq: Once | INTRAVENOUS | Status: AC

## 2020-02-23 NOTE — Progress Notes (Signed)
Progress Note  Patient Name: Casey Richard Date of Encounter: 02/23/2020  [facility] Cardiologist: Sanda Klein, MD   Subjective   Denies shortness of breath or chest pain  Inpatient Medications    Scheduled Meds: . artificial tears   Right Eye Q4H  . bethanechol  10 mg Per Tube TID  . Chlorhexidine Gluconate Cloth  6 each Topical Daily  . docusate  100 mg Per Tube Daily  . enoxaparin (LOVENOX) injection  40 mg Subcutaneous Q24H  . feeding supplement (ENSURE ENLIVE)  237 mL Oral Q24H  . feeding supplement (OSMOLITE 1.5 CAL)  355 mL Per Tube QID  . feeding supplement (PROSource TF)  45 mL Per Tube BID  . pantoprazole sodium  40 mg Per Tube Daily  . povidone-iodine  2 application Topical Once  . QUEtiapine  25 mg Per Tube QHS   Continuous Infusions: . methocarbamol (ROBAXIN) IV     PRN Meds: acetaminophen, guaiFENesin, HYDROcodone-acetaminophen, HYDROcodone-acetaminophen, ipratropium-albuterol, methocarbamol **OR** methocarbamol (ROBAXIN) IV, morphine injection, ondansetron **OR** ondansetron (ZOFRAN) IV, polyethylene glycol   Vital Signs    Vitals:   02/22/20 0643 02/22/20 0647 02/22/20 1303 02/23/20 0532  BP:  117/67 125/75 102/71  Pulse:  81 86 78  Resp:  20 18 19   Temp:  99.4 F (37.4 C) 98.2 F (36.8 C) 97.9 F (36.6 C)  TempSrc:  Oral  Oral  SpO2:  98% 97% 100%  Weight: 73.8 kg   73.7 kg  Height:        Intake/Output Summary (Last 24 hours) at 02/23/2020 0914 Last data filed at 02/23/2020 0500 Gross per 24 hour  Intake 1982 ml  Output 3000 ml  Net -1018 ml   Last 3 Weights 02/23/2020 02/22/2020 02/20/2020  Weight (lbs) 162 lb 7.7 oz 162 lb 11.2 oz 173 lb 11.6 oz  Weight (kg) 73.7 kg 73.8 kg 78.8 kg      Telemetry    SR with V pacing, rate 70s-90s - Personally Reviewed  ECG    No new ECG - Personally Reviewed  Physical Exam   GEN: No acute distress.  Eye patch in place on Rt Neck: No JVD, trach in place Cardiac: RRR, no murmurs, rubs, or  gallops.  Respiratory: Clear to auscultation bilaterally. ant GI: Soft, nontender, non-distended  MS: No edema; No deformity. Neuro:  Nonfocal  Psych: Normal affect  Pacer site no acute swelling mild palpated under dressing.  Mildly tender  Labs    High Sensitivity Troponin:  No results for input(s): TROPONINIHS in the last 720 hours.    Chemistry Recent Labs  Lab 02/17/20 2147 02/17/20 2147 02/18/20 0404 02/18/20 0404 02/19/20 0526 02/19/20 0526 02/20/20 0542 02/20/20 1919 02/23/20 0552  NA 139   < > 140   < > 137  --  138  --  140  K 3.9   < > 3.8   < > 4.3  --  4.1  --  4.3  CL 102   < > 104   < > 105  --  109  --  103  CO2 27   < > 27   < > 25  --  24  --  30  GLUCOSE 121*   < > 110*   < > 161*  --  105*  --  103*  BUN 22   < > 17   < > 17  --  16  --  16  CREATININE 0.74   < > 0.65   < >  0.81   < > 0.61 0.65 0.59*  CALCIUM 8.9   < > 8.6*   < > 8.1*  --  7.7*  --  8.4*  PROT 7.7  --  7.1  --   --   --   --   --   --   ALBUMIN 4.2  --  3.8  --   --   --   --   --   --   AST 20  --  19  --   --   --   --   --   --   ALT 30  --  28  --   --   --   --   --   --   ALKPHOS 77  --  71  --   --   --   --   --   --   BILITOT 0.7  --  0.6  --   --   --   --   --   --   GFRNONAA >60   < > >60   < > >60   < > >60 >60 >60  GFRAA >60   < > >60   < > >60   < > >60 >60 >60  ANIONGAP 10   < > 9   < > 7  --  5  --  7   < > = values in this interval not displayed.     Hematology Recent Labs  Lab 02/20/20 0542 02/20/20 0542 02/20/20 1919 02/22/20 1701 02/23/20 0552  WBC 10.1  --  9.9  --  8.1  RBC 2.83*  --  3.02*  --  2.77*  HGB 8.1*   < > 8.7* 8.4* 7.7*  HCT 25.3*   < > 27.2* 26.6* 25.1*  MCV 89.4  --  90.1  --  90.6  MCH 28.6  --  28.8  --  27.8  MCHC 32.0  --  32.0  --  30.7  RDW 15.2  --  15.1  --  14.6  PLT 143*  --  177  --  231   < > = values in this interval not displayed.    BNPNo results for input(s): BNP, PROBNP in the last 168 hours.   DDimer No results  for input(s): DDIMER in the last 168 hours.   Radiology    No results found.  Cardiac Studies   02/20/20 Gen change New Generator Medtronic Azure XT DR model number [id number], serial number [id number] Right atrial lead (chronic) Medtronic , model number [id number], serial number [id number] (implanted 07/15/2011) Right ventricular lead (chronic)  Medtronic, model number [id number], serial number [id number] (implanted 07/15/2011)  Explanted generator Medtronic Revo,  model number [id number], serial number  [id number] (implanted 07/15/2011)   Echo 07/13/2011 Left ventricle: Systolic function was normal. Wall motion  was normal; there were no regional wall motion  abnormalities.  - Aortic valve: No evidence of vegetation.  - Mitral valve: No evidence of vegetation.  - Left atrium: No evidence of thrombus in the atrial cavity  or appendage.  - Right atrium: No evidence of thrombus in the atrial cavity  or appendage.  - Atrial septum: There was a valve-incompetent patent  foramen ovale. There was redundancy of the septum, with  borderline criteria for aneurysm.  - Tricuspid valve: No evidence of vegetation.  - Pulmonic valve: No evidence of vegetation.  Patient Profile     64 y.o. male with PMH of  CHB and NSVT s/p MDT PPM 2013, CKD with PMH of urethral stricture s/p suprapubic catheter placement, h/o aspirin PNA with trach/PEG tube placement presented for R hip fracture after tripping voer his foley catheter. Pt post op now and s/p gen change on [facility] 9/2  Assessment & Plan    S/p PPM gen change for ERI.  Hx CHB.  Site stable. 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

## 2020-02-23 NOTE — Progress Notes (Addendum)
PROGRESS NOTE    Casey Richard  MCN:[id number] DOB: Dec 15, 1955 DOA: 02/17/2020 PCP: Benito Mccreedy, MD   Brief Narrative:  This 64 year old male with history of V. 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 Danny Lawless from orthopedic was consulted,and patient is being admitted to [facility] for further EP cardiology evaluation prior to undergoing operative intervention.  Patient underwent right hip hemiarthroplasty on 8/31 by orthopedics, postop he had hypotension, acute blood loss anemia likely perioperative blood loss received IV fluid bolus and 1 unit PRBC and Hb stabilized. 02/20/20-PM generator changed at [facility] 02/21/20-  trach decannulation by PCCM.   Assessment & Plan:   Principal Problem:   Closed displaced fracture of right femoral neck (HCC) Active Problems:   Complete heart block [facility])   Pacemaker battery depletion   Dysphagia   Anastomotic stricture of urinary tract   Essential hypertension  Right femoral neck fracture status post fall: s/p right hip hemiarthroplasty  8/31, appreciate orthopedic inputs.  Continue WBAT RLE, reinforce dressing as needed, Lovenox 40 mg daily x 30 days for DVT prophylaxis, continue pain control and follow-up 2 weeks with with Dr. Mardelle Matte. Continue PT OT and SNF plan  Hypotension : Suspect hypovolemia, also acute blood loss anemia peri operative .  Improved after IV fluid bolus and blood transfusion 1 unit.  Meds on hold. Will resumes once BP improves.   Acute blood loss anemia, perioperative: Estimated blood loss 300 ml in surgery.  H& H trended down to 7.6 g  received 1 unit PRBC 9/1- Hb at 8.7 gm, Recheck Hb 7.7,   Transfuse 1 PRBC , follow up post transfusion Hb.  History of complete heart block / V. fib arrest /status post pacemaker . He was scheduled to undergo generator change next week on outpatient setting and EP has seen  S/p gen change by cardio at [facility] 02/20/20.  dressing change done by cardiology.  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  Dysphagia/risk of aspirationresulting from cranial nerve injury following schwannoma surgery in 2021, is on bolus PEG tube feeding at home and also takes by mouth.  Continue dietitian on board.  History of cystic schwannoma requiring craniotomy/tracheostomy.  Tracheostomy removed 9/3.  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  Continue Foley in place  Essential hypertension-blood pressure medication on hold due to hypotension yesterday.  GERD- keep on PPI.   DVT prophylaxis:  Lovenox Code Status: Full  Family Communication:  Plan of care discussed with patient in detail. Disposition Plan:   Dispo: The patient is from: SNF  Anticipated d/c is to: SNF  Anticipated d/c date is: 2 days once okay with PCCM/Ortho and cardio.  Patient currently is not medically stable to d/c.   Consultants:   Cardiology, orthopedics, PCCM.  Procedures: Antimicrobials: Anti-infectives (From admission, onward)   Start     Dose/Rate Route Frequency Ordered Stop   02/20/20 2000  ceFAZolin (ANCEF) IVPB 1 g/50 mL premix        1 g 100 mL/hr over 30 Minutes Intravenous Every 6 hours 02/20/20 1753 02/21/20 1659   02/20/20 1400  ceFAZolin (ANCEF) IVPB 2g/100 mL premix        2 g 200 mL/hr over 30 Minutes Intravenous On call 02/20/20 0401 02/20/20 1432   02/20/20 1400  gentamicin (GARAMYCIN) 80 mg in sodium chloride 0.9 %  500 mL irrigation        80 mg Irrigation On call 02/20/20 0401 02/20/20 1433   02/20/20 0500  gentamicin (GARAMYCIN) 80 mg in sodium chloride 0.9 % 500 mL irrigation  Status:  Discontinued        80 mg Irrigation On call 02/20/20 0400 02/20/20 0401   02/20/20 0500  ceFAZolin (ANCEF) IVPB 2g/100 mL premix  Status:  Discontinued        2 g 200 mL/hr over 30 Minutes Intravenous On call 02/20/20 0400 02/20/20 0401   02/18/20 2032  ceFAZolin (ANCEF) 2-4 GM/100ML-% IVPB       Note to Pharmacy: Benjamin Stain   : cabinet override      02/18/20 2032 02/18/20 2040   02/18/20 2025  ceFAZolin (ANCEF) 1-4 GM/50ML-% IVPB  Status:  Discontinued       Note to Pharmacy: Benjamin Stain   : cabinet override      02/18/20 2025 02/18/20 2032   02/18/20 2015  ceFAZolin (ANCEF) IVPB 2g/100 mL premix        2 g 200 mL/hr over 30 Minutes Intravenous Every 6 hours 02/18/20 2007 02/19/20 0255   02/18/20 1100  ceFAZolin (ANCEF) IVPB 2g/100 mL premix        2 g 200 mL/hr over 30 Minutes Intravenous On call to O.R. 02/17/20 2315 02/18/20 1406     Subjective: Patient was seen and examined at bedside.  Overnight events noted.  He remains afebrile. He is alert, awake, oriented x 3 ,not in any acute distress.  Patient has a PEG tube  Objective: Vitals:   02/22/20 0647 02/22/20 1303 02/23/20 0532 02/23/20 1343  BP: 117/67 125/75 102/71 114/73  Pulse: 81 86 78 83  Resp: 20 18 19 16   Temp: 99.4 F (37.4 C) 98.2 F (36.8 C) 97.9 F (36.6 C) 98.4 F (36.9 C)  TempSrc: Oral  Oral Oral  SpO2: 98% 97% 100% 95%  Weight:   73.7 kg   Height:        Intake/Output Summary (Last 24 hours) at 02/23/2020 1444 Last data filed at 02/23/2020 0800 Gross per 24 hour  Intake 915 ml  Output 3000 ml  Net -2085 ml   Filed Weights   02/20/20 0500 02/22/20 0643 02/23/20 0532  Weight: 78.8 kg 73.8 kg 73.7 kg    Examination:  General exam: Appears calm and comfortable , tracheostomy site clean. Respiratory system: Clear to  auscultation. Respiratory effort normal. Cardiovascular system: S1 & S2 heard, RRR. No JVD, murmurs, rubs, gallops or clicks. No pedal edema. Gastrointestinal system: Abdomen is nondistended, soft and nontender. No organomegaly or masses felt. Normal bowel sounds heard. Peg tube noted. Central nervous system: Alert and oriented. No focal neurological deficits. Extremities:  No leg edema, peripheral pulses palpable. Skin: No rashes, lesions or ulcers Psychiatry: Judgement and insight appear normal. Mood & affect appropriate.     Data Reviewed: I have personally reviewed following labs and imaging studies  CBC: Recent Labs  Lab 02/18/20 0404 02/18/20 0404 02/19/20 0526 02/19/20 1026 02/20/20 0542 02/20/20 1919 02/22/20 1701 02/23/20 0552 02/23/20 0854  WBC 10.9*  --  12.2*  --  10.1 9.9  --  8.1  --   NEUTROABS 7.9*  --   --   --   --   --   --   --   --  HGB 11.6*   < > 8.4*   < > 8.1* 8.7* 8.4* 7.7* 7.7*  HCT 36.0*   < > 26.6*   < > 25.3* 27.2* 26.6* 25.1* 24.5*  MCV 88.5  --  90.2  --  89.4 90.1  --  90.6  --   PLT 174  --  144*  --  143* 177  --  231  --    < > = values in this interval not displayed.   Basic Metabolic Panel: Recent Labs  Lab 02/17/20 2147 02/17/20 2147 02/18/20 0404 02/19/20 0526 02/20/20 0542 02/20/20 1919 02/23/20 0552  NA 139  --  140 137 138  --  140  K 3.9  --  3.8 4.3 4.1  --  4.3  CL 102  --  104 105 109  --  103  CO2 27  --  27 25 24   --  30  GLUCOSE 121*  --  110* 161* 105*  --  103*  BUN 22  --  17 17 16   --  16  CREATININE 0.74   < > 0.65 0.81 0.61 0.65 0.59*  CALCIUM 8.9  --  8.6* 8.1* 7.7*  --  8.4*  MG  --   --  2.0  --   --   --   --    < > = values in this interval not displayed.   GFR: Estimated Creatinine Clearance: 95.1 mL/min (A) (by C-G formula based on SCr of 0.59 mg/dL (L)). Liver Function Tests: Recent Labs  Lab 02/17/20 2147 02/18/20 0404  AST 20 19  ALT 30 28  ALKPHOS 77 71  BILITOT 0.7 0.6  PROT 7.7 7.1   ALBUMIN 4.2 3.8   No results for input(s): LIPASE, AMYLASE in the last 168 hours. No results for input(s): AMMONIA in the last 168 hours. Coagulation Profile: Recent Labs  Lab 02/18/20 0404  INR 1.1   Cardiac Enzymes: No results for input(s): CKTOTAL, CKMB, CKMBINDEX, TROPONINI in the last 168 hours. BNP (last 3 results) No results for input(s): PROBNP in the last 8760 hours. HbA1C: No results for input(s): HGBA1C in the last 72 hours. CBG: Recent Labs  Lab 02/22/20 1938 02/23/20 0004 02/23/20 0436 02/23/20 0743 02/23/20 1124  GLUCAP 111* 146* 95 136* 106*   Lipid Profile: No results for input(s): CHOL, HDL, LDLCALC, TRIG, CHOLHDL, LDLDIRECT in the last 72 hours. Thyroid Function Tests: No results for input(s): TSH, T4TOTAL, FREET4, T3FREE, THYROIDAB in the last 72 hours. Anemia Panel: No results for input(s): VITAMINB12, FOLATE, FERRITIN, TIBC, IRON, RETICCTPCT in the last 72 hours. Sepsis Labs: No results for input(s): PROCALCITON, LATICACIDVEN in the last 168 hours.  Recent Results (from the past 240 hour(s))  SARS Coronavirus 2 by RT PCR (hospital order, performed in [facility] hospital lab) Nasopharyngeal Nasopharyngeal Swab     Status: None   Collection Time: 02/17/20  9:47 PM   Specimen: Nasopharyngeal Swab  Result Value Ref Range Status   SARS Coronavirus 2 NEGATIVE NEGATIVE Final    Comment: (NOTE) SARS-CoV-2 target nucleic acids are NOT DETECTED.  The SARS-CoV-2 RNA is generally detectable in upper and lower respiratory specimens during the acute phase of infection. The lowest concentration of SARS-CoV-2 viral copies this assay can detect is 250 copies / mL. A negative result does not preclude SARS-CoV-2 infection and should not be used as the sole basis for treatment or other patient management decisions.  A negative result may occur with improper specimen collection /  handling, submission of specimen other than nasopharyngeal swab, presence of viral  mutation(s) within the areas targeted by this assay, and inadequate number of viral copies (<250 copies / mL). A negative result must be combined with clinical observations, patient history, and epidemiological information.  Fact Sheet for Patients:   StrictlyIdeas.no  Fact Sheet for Healthcare Providers: BankingDealers.co.za  This test is not yet approved or  cleared by the [place] FDA and has been authorized for detection and/or diagnosis of SARS-CoV-2 by FDA under an Emergency Use Authorization (EUA).  This EUA will remain in effect (meaning this test can be used) for the duration of the COVID-19 declaration under Section 564(b)(1) of the Act, 21 U.S.C. section 360bbb-3(b)(1), unless the authorization is terminated or revoked sooner.  Performed at [facility], [place] [address] [place], [place] [address]   MRSA PCR Screening     Status: None   Collection Time: 02/18/20  9:14 PM   Specimen: Nasal Mucosa; Nasopharyngeal  Result Value Ref Range Status   MRSA by PCR NEGATIVE NEGATIVE Final    Comment:        The GeneXpert MRSA Assay (FDA approved for NASAL specimens only), is one component of a comprehensive MRSA colonization surveillance program. It is not intended to diagnose MRSA infection nor to guide or monitor treatment for MRSA infections. Performed at [place], [place] [address] [place], [place] [address]   Surgical PCR screen     Status: None   Collection Time: 02/20/20  4:38 AM   Specimen: Nasal Mucosa; Nasal Swab  Result Value Ref Range Status   MRSA, PCR NEGATIVE NEGATIVE Final   Staphylococcus aureus NEGATIVE NEGATIVE Final    Comment: (NOTE) The Xpert SA Assay (FDA approved for NASAL specimens in patients 17 years of age and older), is one component of a comprehensive surveillance program. It is not intended to diagnose infection nor to guide or monitor  treatment. Performed at [facility], [place] [address], [place], [place] [address]      Radiology Studies: No results found.  Scheduled Meds: . artificial tears   Right Eye Q4H  . bethanechol  10 mg Per Tube TID  . Chlorhexidine Gluconate Cloth  6 each Topical Daily  . docusate  100 mg Per Tube Daily  . enoxaparin (LOVENOX) injection  40 mg Subcutaneous Q24H  . feeding supplement (ENSURE ENLIVE)  237 mL Oral Q24H  . feeding supplement (OSMOLITE 1.5 CAL)  355 mL Per Tube QID  . feeding supplement (PROSource TF)  45 mL Per Tube BID  . pantoprazole sodium  40 mg Per Tube Daily  . povidone-iodine  2 application Topical Once  . QUEtiapine  25 mg Per Tube QHS   Continuous Infusions: . methocarbamol (ROBAXIN) IV       LOS: 5 days    Time spent: 35 mins    Casey Ketter, MD [facility]   If 7PM-7AM, please contact night-coverage

## 2020-02-24 LAB — CBC
HCT: 27.3 % — ABNORMAL LOW (ref 39.0–52.0)
Hemoglobin: 8.7 g/dL — ABNORMAL LOW (ref 13.0–17.0)
MCH: 28.4 pg (ref 26.0–34.0)
MCHC: 31.9 g/dL (ref 30.0–36.0)
MCV: 89.2 fL (ref 80.0–100.0)
Platelets: 292 10*3/uL (ref 150–400)
RBC: 3.06 MIL/uL — ABNORMAL LOW (ref 4.22–5.81)
RDW: 15 % (ref 11.5–15.5)
WBC: 10.1 10*3/uL (ref 4.0–10.5)
nRBC: 0 % (ref 0.0–0.2)

## 2020-02-24 LAB — BASIC METABOLIC PANEL
Anion gap: 10 (ref 5–15)
BUN: 17 mg/dL (ref 8–23)
CO2: 27 mmol/L (ref 22–32)
Calcium: 8.5 mg/dL — ABNORMAL LOW (ref 8.9–10.3)
Chloride: 100 mmol/L (ref 98–111)
Creatinine, Ser: 0.62 mg/dL (ref 0.61–1.24)
GFR calc Af Amer: >60 mL/min (ref >60)
GFR calc non Af Amer: >60 mL/min (ref >60)
Glucose, Bld: 108 mg/dL — ABNORMAL HIGH (ref 70–99)
Potassium: 4.2 mmol/L (ref 3.5–5.1)
Sodium: 137 mmol/L (ref 135–145)

## 2020-02-24 LAB — TYPE AND SCREEN
ABO/RH(D): O POS
Antibody Screen: NEGATIVE
Unit division: 0

## 2020-02-24 LAB — GLUCOSE, CAPILLARY
Glucose-Capillary: 103 mg/dL — ABNORMAL HIGH (ref 70–99)
Glucose-Capillary: 104 mg/dL — ABNORMAL HIGH (ref 70–99)
Glucose-Capillary: 110 mg/dL — ABNORMAL HIGH (ref 70–99)
Glucose-Capillary: 113 mg/dL — ABNORMAL HIGH (ref 70–99)
Glucose-Capillary: 139 mg/dL — ABNORMAL HIGH (ref 70–99)
Glucose-Capillary: 144 mg/dL — ABNORMAL HIGH (ref 70–99)

## 2020-02-24 LAB — BPAM RBC
Blood Product Expiration Date: 202110032359
ISSUE DATE / TIME: 202109051718
Unit Type and Rh: 5100

## 2020-02-24 LAB — PHOSPHORUS: Phosphorus: 4 mg/dL (ref 2.5–4.6)

## 2020-02-24 LAB — MAGNESIUM: Magnesium: 2 mg/dL (ref 1.7–2.4)

## 2020-02-24 NOTE — Progress Notes (Signed)
PROGRESS NOTE    Casey Richard   AYT:[id number]  DOB: 1955/07/11  DOA: 02/17/2020     6  PCP: Benito Mccreedy, MD  CC: right hip fracture  Hospital Course: This 64 year old male with history of V. 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 Patient presented to ED status post fall in the evening 8/30, In the ED x-ray showed right femoral neck fracture, Dr. Danny Lawless from orthopedic was consulted, and patient is being admitted to [facility] for further EP cardiology evaluation prior to undergoing operative intervention.  Patient underwent right hip hemiarthroplasty on 8/31 by orthopedics, postop he had hypotension, acute blood loss anemia likely perioperative blood loss received IV fluid bolus and 1 unit PRBC and Hb stabilized. 02/20/20-PM generator changed at [facility] 02/21/20-  trach decannulation by PCCM.  Now medically stable and awaiting SNF placement.    Interval History:  No events overnight.  Patient sitting up in chair at bedside this morning in no distress.  His diet has been slowly advanced and he has no acute concerns or complaints this morning.  He was also seen ambulating well in the hallway with physical therapy.  Plan is to await bed availability at SNF at this time.  Old records reviewed in assessment of this patient  ROS: Constitutional: negative for chills and fevers, Respiratory: negative for cough, Cardiovascular: negative for chest pain and Gastrointestinal: negative for abdominal pain  Assessment & Plan: Right femoral neck fracture status post fall:s/p right hip hemiarthroplasty 8/31, appreciate orthopedic inputs. Continue WBAT  RLE, reinforce dressing as needed, Lovenox 40 mg daily x 30 days for DVT prophylaxis, continue pain control and follow-up 2 weeks with with Dr. Mardelle Matte. Continue PT OT and SNF plan - stable for d/c when SNF available   Hypotension : Suspect hypovolemia, also acute blood loss anemia peri operative . Improved after IV fluid bolus and blood transfusion 1 unit. Meds on hold. Will resumes once BP improves.   Acute blood loss anemia, perioperative: Estimated blood loss 300 ml in surgery.  H& H trended down to 7.6 g received 1 unit PRBC 9/1- Hb at 8.7 gm, Recheck Hb 7.7,   Transfuse 1 PRBC , follow up post transfusion Hb.  History of complete heart block / V. fib arrest /status post pacemaker . He was scheduled to undergo generator change next week on outpatient setting and EP has seenS/p gen change by cardio at [facility] 02/20/20.  dressing change done by cardiology.  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  Dysphagia/risk of aspirationresulting from cranial nerve injury following schwannoma surgery in 2021,is on bolus PEG tube feeding at home and also takes by mouth. Continue dietitian on board.  History of cystic schwannomarequiring craniotomy/tracheostomy.  Tracheostomy removed 9/3.  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 Continue Foley in place  Essential hypertension-blood pressure medication on hold due to hypotension yesterday.  GERD- keep on PPI.  Antimicrobials: Courses completed  DVT prophylaxis: Lovenox Code Status: Full Family Communication: none present Disposition Plan: Status is: Inpatient  Remains inpatient appropriate because:Unsafe d/c plan and Inpatient level of care appropriate due to severity of illness   Dispo: The  patient is from: Home              Anticipated d/c is to: SNF               Anticipated d/c date is: 1 day              Patient currently is not medically stable to d/c.       Objective: Blood pressure 122/82, pulse 78, temperature 97.9 F (36.6 C), temperature source Oral, resp. rate 16, height 5\' 10"  (1.778 m), weight 74.2 kg, SpO2 96 %.  Examination: General appearance: Pleasant adult man sitting up in chair in no distress.  Mild dysarthria and obvious right facial droop Head: Normocephalic, without obvious abnormality, Right facial droop appreciated.  Neck: Trach site noted with bandage in place and healing wound with no surrounding signs of infection Eyes: Patch over right eye.  Right eye is bulging outward with reactive pupil appreciated and eyelid is half closed. Lungs: clear to auscultation bilaterally Heart: regular rate and rhythm and S1, S2 normal  Chest: Recent pacemaker site noted on right chest with bandage in place and no surrounding signs of infection Abdomen: G-tube in place.  Abdomen soft with bowel sounds present Extremities: No edema Skin: mobility and turgor normal Neurologic: No obvious perceived weakness.  Overall 5/5 and symmetric.  Paresthesias noted in left upper extremity and right facial distributions of V2 (patient had hard time expressing positive or negative paresthesias)  Consultants:   Cardiology  Orthopedic surgery  Data Reviewed: I have personally reviewed following labs and imaging studies Results for orders placed or performed during the hospital encounter of 02/17/20 (from the past 24 hour(s))  Type and screen [place]     Status: None   Collection Time: 02/23/20  3:07 PM  Result Value Ref Range   ABO/RH(D) O POS    Antibody Screen NEG    Sample Expiration 02/26/2020,2359    Unit Number [id number]    Blood Component Type RED CELLS,LR    Unit division 00    Status of Unit ISSUED,FINAL    Transfusion Status OK TO TRANSFUSE    Crossmatch Result      Compatible Performed at [facility], [place] [address] [place], [place] [address]   Prepare RBC (crossmatch)     Status: None   Collection Time: 02/23/20  3:07 PM  Result Value Ref Range   Order Confirmation      ORDER PROCESSED BY BLOOD BANK Performed at [facility], [place] [address], [place], [place] [address]   Glucose, capillary     Status: Abnormal   Collection Time: 02/23/20  4:24 PM  Result Value Ref Range   Glucose-Capillary 136 (H) 70 - 99 mg/dL  Glucose, capillary     Status: Abnormal   Collection Time: 02/23/20  8:07 PM  Result Value Ref Range   Glucose-Capillary 107 (H) 70 - 99 mg/dL  Hemoglobin and hematocrit, blood     Status: Abnormal   Collection Time: 02/23/20 10:00 PM  Result Value Ref Range   Hemoglobin 9.0 (L) 13.0 - 17.0 g/dL   HCT 29.0 (L) 39 - 52 %  Glucose, capillary     Status: Abnormal   Collection Time: 02/24/20 12:02 AM  Result Value Ref Range   Glucose-Capillary 113 (H) 70 - 99 mg/dL  Glucose, capillary     Status: Abnormal   Collection Time: 02/24/20  3:50 AM  Result Value Ref Range  Glucose-Capillary 110 (H) 70 - 99 mg/dL  CBC     Status: Abnormal   Collection Time: 02/24/20  5:58 AM  Result Value Ref Range   WBC 10.1 4.0 - 10.5 K/uL   RBC 3.06 (L) 4.22 - 5.81 MIL/uL   Hemoglobin 8.7 (L) 13.0 - 17.0 g/dL   HCT 27.3 (L) 39 - 52 %   MCV 89.2 80.0 - 100.0 fL   MCH 28.4 26.0 - 34.0 pg   MCHC 31.9 30.0 - 36.0 g/dL   RDW 15.0 11.5 - 15.5 %   Platelets 292 150 - 400 K/uL   nRBC 0.0 0.0 - 0.2 %  Basic metabolic panel     Status: Abnormal   Collection Time: 02/24/20  5:58 AM  Result Value Ref Range   Sodium 137 135 - 145 mmol/L   Potassium 4.2 3.5 - 5.1 mmol/L   Chloride 100 98 - 111 mmol/L   CO2 27 22 - 32 mmol/L   Glucose, Bld 108 (H) 70 - 99 mg/dL   BUN 17 8 - 23 mg/dL   Creatinine, Ser 0.62 0.61 - 1.24 mg/dL   Calcium 8.5 (L) 8.9 - 10.3 mg/dL   GFR calc non Af Amer >60 >60 mL/min   GFR calc Af Amer >60 >60 mL/min   Anion gap 10 5 - 15   Magnesium     Status: None   Collection Time: 02/24/20  5:58 AM  Result Value Ref Range   Magnesium 2.0 1.7 - 2.4 mg/dL  Phosphorus     Status: None   Collection Time: 02/24/20  5:58 AM  Result Value Ref Range   Phosphorus 4.0 2.5 - 4.6 mg/dL  Glucose, capillary     Status: Abnormal   Collection Time: 02/24/20  8:07 AM  Result Value Ref Range   Glucose-Capillary 103 (H) 70 - 99 mg/dL   Comment 1 Notify RN    Comment 2 Document in Chart   Glucose, capillary     Status: Abnormal   Collection Time: 02/24/20 11:44 AM  Result Value Ref Range   Glucose-Capillary 139 (H) 70 - 99 mg/dL   Comment 1 Notify RN     Recent Results (from the past 240 hour(s))  SARS Coronavirus 2 by RT PCR (hospital order, performed in [place] hospital lab) Nasopharyngeal Nasopharyngeal Swab     Status: None   Collection Time: 02/17/20  9:47 PM   Specimen: Nasopharyngeal Swab  Result Value Ref Range Status   SARS Coronavirus 2 NEGATIVE NEGATIVE Final    Comment: (NOTE) SARS-CoV-2 target nucleic acids are NOT DETECTED.  The SARS-CoV-2 RNA is generally detectable in upper and lower respiratory specimens during the acute phase of infection. The lowest concentration of SARS-CoV-2 viral copies this assay can detect is 250 copies / mL. A negative result does not preclude SARS-CoV-2 infection and should not be used as the sole basis for treatment or other patient management decisions.  A negative result may occur with improper specimen collection / handling, submission of specimen other than nasopharyngeal swab, presence of viral mutation(s) within the areas targeted by this assay, and inadequate number of viral copies (<250 copies / mL). A negative result must be combined with clinical observations, patient history, and epidemiological information.  Fact Sheet for Patients:   StrictlyIdeas.no  Fact Sheet for Healthcare Providers: BankingDealers.co.za  This  test is not yet approved or  cleared by the [place] FDA and has been authorized for detection and/or diagnosis of SARS-CoV-2 by FDA  under an Emergency Use Authorization (EUA).  This EUA will remain in effect (meaning this test can be used) for the duration of the COVID-19 declaration under Section 564(b)(1) of the Act, 21 U.S.C. section 360bbb-3(b)(1), unless the authorization is terminated or revoked sooner.  Performed at [facility], [place] [address], [place], [place] [address]   MRSA PCR Screening     Status: None   Collection Time: 02/18/20  9:14 PM   Specimen: Nasal Mucosa; Nasopharyngeal  Result Value Ref Range Status   MRSA by PCR NEGATIVE NEGATIVE Final    Comment:        The GeneXpert MRSA Assay (FDA approved for NASAL specimens only), is one component of a comprehensive MRSA colonization surveillance program. It is not intended to diagnose MRSA infection nor to guide or monitor treatment for MRSA infections. Performed at [facility], [place] [address] [place], [place] [address]   Surgical PCR screen     Status: None   Collection Time: 02/20/20  4:38 AM   Specimen: Nasal Mucosa; Nasal Swab  Result Value Ref Range Status   MRSA, PCR NEGATIVE NEGATIVE Final   Staphylococcus aureus NEGATIVE NEGATIVE Final    Comment: (NOTE) The Xpert SA Assay (FDA approved for NASAL specimens in patients 6 years of age and older), is one component of a comprehensive surveillance program. It is not intended to diagnose infection nor to guide or monitor treatment. Performed at [facility], [place] [address], [place],  [address]      Radiology Studies: No results found. Pelvis Portable  Final Result    DG Hip Port Unilat With Pelvis 1V Right  Final Result    DG Hip Unilat  With Pelvis 2-3 Views Right  Final Result      Scheduled Meds: . artificial tears   Right Eye Q4H  . bethanechol  10 mg Per Tube TID   . Chlorhexidine Gluconate Cloth  6 each Topical Daily  . docusate  100 mg Per Tube Daily  . enoxaparin (LOVENOX) injection  40 mg Subcutaneous Q24H  . feeding supplement (ENSURE ENLIVE)  237 mL Oral Q24H  . feeding supplement (OSMOLITE 1.5 CAL)  355 mL Per Tube QID  . feeding supplement (PROSource TF)  45 mL Per Tube BID  . pantoprazole sodium  40 mg Per Tube Daily  . povidone-iodine  2 application Topical Once  . QUEtiapine  25 mg Per Tube QHS   PRN Meds: acetaminophen, guaiFENesin, HYDROcodone-acetaminophen, HYDROcodone-acetaminophen, ipratropium-albuterol, methocarbamol **OR** methocarbamol (ROBAXIN) IV, morphine injection, ondansetron **OR** ondansetron (ZOFRAN) IV, polyethylene glycol Continuous Infusions: . methocarbamol (ROBAXIN) IV        LOS: 6 days  Time spent: Greater than 50% of the 35 minute visit was spent in counseling/coordination of care for the patient as laid out in the A&P.   Dwyane Dee, MD [facility] 02/24/2020, 2:42 PM  Contact via secure chat.  To contact the attending provider between 7A-7P or the covering provider during after hours 7P-7A, please log into the web site [email or web address] and access using universal [place] password for that web site. If you do not have the password, please call the hospital operator.

## 2020-02-24 NOTE — TOC Progression Note (Signed)
Transition of Care [facility]) - Progression Note    Patient Details  Name: Casey Richard MRN: [id number] Date of Birth: Feb 25, 1956  Transition of Care [facility]) CM/SW Contact  Irving Lubbers, Juliann Pulse, RN Phone Number: 02/24/2020, 11:53 AM                    U.S. flag   An official website of the [employer] how you know  [employer]ar[employer] manager a [employer] in  menu   Back  What's new    Print               My Location                 Nursing homes    Provider Type                Name of facility (optional)     Search       Filter by:                Distance: 25 mi.                   Overall rating                   Other ratings                   # of certified beds                   More filters       Clear all filters                 Showing 1 - 15 of 44 nursing homes    Sort by:   Closest      1.  1.7 mi     [place] [facility] [place]  [place], [place] [address]  [phone]                Overall rating    Below average  Compare    2.  2 mi     [facility] [place], [place] [address]  [phone]                Overall rating    Below average  Compare    3.  2.1 mi     [place] [facility] [place], [place] [address]  [phone]                Overall rating    Much below average  Compare    4.  2.2 mi     [facility]        [place], [place]  [address]  [phone]                Overall rating    Much above average  Compare          5.  2.2 mi     [facility]  [place]        [place], [place] [address]  [phone]                Overall rating    Below average  Compare    6.  2.7 [place]        [place], [place] [address]  [phone]                Overall rating    Much above average  Compare    7.  3.2 mi     [place]        [address] [place] [address]  [place], [place] [address]  [phone]                Overall rating    Below average  Compare    8.  3.3 [place]        [address] [place], [place] [address]  [phone]                Overall rating    Below average  Compare    9.  3.5 mi     [facility]        [place], [place] [address]  [phone]                Overall rating    Average  Compare    10.  4.3 [place], [place] [address]  [phone]                Overall rating    Below average  Compare    11.  4.7 mi     [facility] [place], [place] [address]  [phone]                Overall rating    Much above average  Compare    12.  5.2 mi     [facility]        [address] [place], [place] [address]  [phone]                Overall rating    Much above average  Compare    13.  6.2 mi     [facility]        [address]  [place], [place] [address]  [phone]                Overall rating    Average  Compare    14.  8.1 [place]        [place], [place] [address]  [phone]                Overall rating    Above average  Compare    15.  8.1 mi     [facility] [place] [facility] [place]  [place], [place] [address]  [phone]  Overall rating    Much below average  Compare   <Previous  1 2 3  All Next>   Data last updated: February 12, 2020  To explore and download nursing home data,visit the data catalog on CMS.gov     Search this area     Next steps for choosing a nursing home      15   14   13   12   11   10   9   8   7   6   5   4   3   2   1              Mapbox  OpenStreetMap Improve this map        About MedicareMedicare Glossary  Nondiscrimination/AccessibilityPrivacy PolicyPrivacy SettingLinking PolicyUsing this sitePlain Writing    Department of Health and [place] website managed and paid for by the U.[place]. Centers for [employer] and [employer]. [place], [place] [address]   Medicare.gov                                    An internal error occurred; please try again later.       Feedback Clinical Narrative:       Expected Discharge Plan: [place] Barriers to Discharge: Continued Medical Work up  Expected Discharge Plan and Services Expected Discharge Plan: [place]   Discharge Planning Services: CM Consult Post Acute Care Choice: [place] Living arrangements for the past 2 months:  (LTC)                                       Social Determinants of Health (SDOH)  Interventions    Readmission Risk Interventions No flowsheet data found.

## 2020-02-24 NOTE — TOC Progression Note (Signed)
Transition of Care [facility]) - Progression Note    Patient Details  Name: Casey Richard MRN: [id number] Date of Birth: 1956/02/13  Transition of Care [facility]) CM/SW Contact  Rozell Kettlewell, Juliann Pulse, RN Phone Number: 02/24/2020, 12:04 PM  Clinical Narrative: Bed offers given-await choice from patient. TC Brother Bill [phone] vm full.      Expected Discharge Plan: [place] Barriers to Discharge: Continued Medical Work up  Expected Discharge Plan and Services Expected Discharge Plan: [place]   Discharge Planning Services: CM Consult Post Acute Care Choice: [place] Living arrangements for the past 2 months:  (LTC)                                       Social Determinants of Health (SDOH) Interventions    Readmission Risk Interventions No flowsheet data found.

## 2020-02-24 NOTE — Progress Notes (Signed)
Physical Therapy Treatment Patient Details Name: Casey Richard MRN: [id number] DOB: [birth year] Today's Date: 02/24/2020    History of Present Illness 64 y.o. male with PMH craniotomy/resection of cystic schwannoma in APril 2021, CHB, NSVT s/p MDT PPM 2013, urethral stricture s/p suprapubic catheter placement, h/o PNA with trach/PEG tube placement presented for R hip fracture after tripping over his foley catheter. Pt underwent Rt hemiarthroplasty on 8/31, is now s/p gen change on PPM on 9/2, and has had trach removed on 9/3.    PT Comments    Patient making gradual progress with Acute PT and advanced gait to increased distance this session. He continues to required cues for posterior hip precautions and was able to recall 2/3 without cuing at start. Mod assist required for transfer and gait with EVA walker and pt abel to advance Rt LE more compared to evaluation date. LE exercises reviewed for LE strengthening and hip ROM. Patient will continue to benefit from skilled PT interventions to address impairments, discharge recommendations remains appropriate, acute will progress as able.   Follow Up Recommendations  SNF     Equipment Recommendations  Rolling walker with 5" wheels    Recommendations for Other Services OT consult     Precautions / Restrictions Precautions Precautions: Fall;Posterior Hip;ICD/Pacemaker Restrictions Weight Bearing Restrictions: No RLE Weight Bearing: Weight bearing as tolerated    Mobility  Bed Mobility Overal bed mobility: Needs Assistance Bed Mobility: Supine to Sit     Supine to sit: Min assist;HOB elevated     General bed mobility comments: cues to maintain posterior hip precautions, assist for Rt LE mobility and increased time.  Transfers Overall transfer level: Needs assistance Equipment used: Bilateral platform walker Transfers: Sit to/from Stand Sit to Stand: From elevated surface;+2 safety/equipment;Min assist         General  transfer comment: Verbal cues for hand palcement with EVA walker and to prevent Rt hip flexion past 90 degrees. Bed elevated. Min assist to steady with rise.  Ambulation/Gait Ambulation/Gait assistance: Mod assist Gait Distance (Feet): 110 Feet Assistive device: Bilateral platform walker Gait Pattern/deviations: Step-to pattern;Decreased stance time - right;Drifts right/left;Narrow base of support Gait velocity: decr   General Gait Details: EVA walker for gait today. Pt required mod assist to manage EVA and cues for safe step pattern. Pt with improved ability to advance Rt LE this date and pt remained aware of Rt foot position in turns to maintain precuations. pt remains High Fall Risk with gait.   Stairs             Wheelchair Mobility    Modified Rankin (Stroke Patients Only)       Balance Overall balance assessment: Needs assistance Sitting-balance support: Feet supported Sitting balance-Leahy Scale: Fair     Standing balance support: During functional activity;Bilateral upper extremity supported Standing balance-Leahy Scale: Poor             Cognition Arousal/Alertness: Awake/alert Behavior During Therapy: WFL for tasks assessed/performed Overall Cognitive Status: Within Functional Limits for tasks assessed Area of Impairment: Memory          Memory: Decreased short-term memory         General Comments: AxO x 3 very motivated. poor memory with names after repeated attempts from self to correct.      Exercises Total Joint Exercises Ankle Circles/Pumps: AROM;Both;15 reps;Seated Short Arc Quad: AROM;Right;10 reps;Seated Hip ABduction/ADduction: AAROM;Right;10 reps;Seated Long Arc Quad: AROM;Right;10 reps;Seated    General Comments  Pertinent Vitals/Pain Pain Assessment: 0-10 Pain Score: 1  Pain Location: Rt hip Pain Descriptors / Indicators: Aching;Discomfort;Operative site guarding Pain Intervention(s): Limited activity within patient's  tolerance;Monitored during session;Repositioned;Ice applied           PT Goals (current goals can now be found in the care plan section) Acute Rehab PT Goals Patient Stated Goal: improve mobility and get recovered to get home PT Goal Formulation: With patient Time For Goal Achievement: 03/06/20 Potential to Achieve Goals: Good Progress towards PT goals: Progressing toward goals    Frequency    Min 3X/week      PT Plan Current plan remains appropriate    Co-evaluation              AM-PAC PT "6 Clicks" Mobility   Outcome Measure  Help needed turning from your back to your side while in a flat bed without using bedrails?: A Lot Help needed moving from lying on your back to sitting on the side of a flat bed without using bedrails?: A Little Help needed moving to and from a bed to a chair (including a wheelchair)?: A Lot Help needed standing up from a chair using your arms (e.g., wheelchair or bedside chair)?: A Lot Help needed to walk in hospital room?: A Lot Help needed climbing 3-5 steps with a railing? : Total 6 Click Score: 12    End of Session Equipment Utilized During Treatment: Gait belt Activity Tolerance: Patient tolerated treatment well Patient left: in chair;with call bell/phone within reach;with chair alarm set Nurse Communication: Mobility status PT Visit Diagnosis: Muscle weakness (generalized) (M62.81);Difficulty in walking, not elsewhere classified (R26.2)     Time: 8338-2505 PT Time Calculation (min) (ACUTE ONLY): 41 min  Charges:  $Gait Training: 8-22 mins $Therapeutic Exercise: 8-22 mins $Therapeutic Activity: 8-22 mins                    Verner Mould, DPT Acute Rehabilitation Services  Office [phone] Pager [phone]  02/24/2020 12:15 PM

## 2020-02-24 NOTE — Hospital Course (Signed)
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 Patient presented to ED status post fall in the evening 8/30, In the ED x-ray showed right femoral neck fracture, Dr. Danny Lawless from orthopedic was consulted, and patient is being admitted to [facility] for further EP cardiology evaluation prior to undergoing operative intervention.  Patient underwent right hip hemiarthroplasty on 8/31 by orthopedics, postop he had hypotension, acute blood loss anemia likely perioperative blood loss received IV fluid bolus and 1 unit PRBC and Hb stabilized. 02/20/20-PM generator changed at [facility] 02/21/20-  trach decannulation by PCCM.  Now medically stable and awaiting SNF placement.

## 2020-02-24 NOTE — Progress Notes (Signed)
Surgical site looks healthy without swelling, redness or drainage  [facility] will sign off.   Medication Recommendations:  Continue same meds Other recommendations (labs, testing, etc):  Change loosely taped 4x4 gauze over pacemaker site daily and if soiled. No tub baths/submersion until fully healed an steristrips have fallen off (usually 2 weeks) Follow up as an outpatient:  "wound check" is scheduled for 02/25/20 and can be canceled. Office visit 3 months, will schedule.

## 2020-02-25 ENCOUNTER — Encounter (HOSPITAL_COMMUNITY): Payer: Self-pay | Admitting: Internal Medicine

## 2020-02-25 ENCOUNTER — Ambulatory Visit: Payer: Medicare Other

## 2020-02-25 DIAGNOSIS — Z9889 Other specified postprocedural states: Secondary | ICD-10-CM

## 2020-02-25 DIAGNOSIS — Z8781 Personal history of (healed) traumatic fracture: Secondary | ICD-10-CM

## 2020-02-25 LAB — CBC WITH DIFFERENTIAL/PLATELET
Abs Immature Granulocytes: 0.07 10*3/uL (ref 0.00–0.07)
Basophils Absolute: 0 10*3/uL (ref 0.0–0.1)
Basophils Relative: 0 %
Eosinophils Absolute: 0.2 10*3/uL (ref 0.0–0.5)
Eosinophils Relative: 1 %
HCT: 29.6 % — ABNORMAL LOW (ref 39.0–52.0)
Hemoglobin: 9.3 g/dL — ABNORMAL LOW (ref 13.0–17.0)
Immature Granulocytes: 1 %
Lymphocytes Relative: 15 %
Lymphs Abs: 1.6 10*3/uL (ref 0.7–4.0)
MCH: 28.2 pg (ref 26.0–34.0)
MCHC: 31.4 g/dL (ref 30.0–36.0)
MCV: 89.7 fL (ref 80.0–100.0)
Monocytes Absolute: 1.5 10*3/uL — ABNORMAL HIGH (ref 0.1–1.0)
Monocytes Relative: 14 %
Neutro Abs: 7.2 10*3/uL (ref 1.7–7.7)
Neutrophils Relative %: 69 %
Platelets: 347 10*3/uL (ref 150–400)
RBC: 3.3 MIL/uL — ABNORMAL LOW (ref 4.22–5.81)
RDW: 14.9 % (ref 11.5–15.5)
WBC: 10.5 10*3/uL (ref 4.0–10.5)
nRBC: 0 % (ref 0.0–0.2)

## 2020-02-25 LAB — BASIC METABOLIC PANEL
Anion gap: 8 (ref 5–15)
BUN: 19 mg/dL (ref 8–23)
CO2: 28 mmol/L (ref 22–32)
Calcium: 8.4 mg/dL — ABNORMAL LOW (ref 8.9–10.3)
Chloride: 103 mmol/L (ref 98–111)
Creatinine, Ser: 0.58 mg/dL — ABNORMAL LOW (ref 0.61–1.24)
GFR calc Af Amer: >60 mL/min (ref >60)
GFR calc non Af Amer: >60 mL/min (ref >60)
Glucose, Bld: 110 mg/dL — ABNORMAL HIGH (ref 70–99)
Potassium: 4.2 mmol/L (ref 3.5–5.1)
Sodium: 139 mmol/L (ref 135–145)

## 2020-02-25 LAB — GLUCOSE, CAPILLARY
Glucose-Capillary: 97 mg/dL (ref 70–99)
Glucose-Capillary: 110 mg/dL — ABNORMAL HIGH (ref 70–99)
Glucose-Capillary: 95 mg/dL (ref 70–99)
Glucose-Capillary: 73 mg/dL (ref 70–99)
Glucose-Capillary: 94 mg/dL (ref 70–99)

## 2020-02-25 LAB — MAGNESIUM: Magnesium: 2.1 mg/dL (ref 1.7–2.4)

## 2020-02-25 LAB — SARS CORONAVIRUS 2 BY RT PCR (HOSPITAL ORDER, PERFORMED IN ~~LOC~~ HOSPITAL LAB): SARS Coronavirus 2: NEGATIVE

## 2020-02-25 MED ORDER — LACTULOSE 10 GM/15ML PO SOLN
20.0000 g | Freq: Two times a day (BID) | ORAL | Status: DC
  Administered 2020-02-25: 20 g via ORAL
  Filled 2020-02-25: qty 30

## 2020-02-25 MED ORDER — LACTULOSE 10 GM/15ML PO SOLN
20.0000 g | Freq: Two times a day (BID) | ORAL | Status: DC
  Administered 2020-02-25 – 2020-02-27 (×4): 20 g
  Filled 2020-02-25 (×4): qty 30

## 2020-02-25 MED ORDER — OSMOLITE 1.5 CAL PO LIQD
237.0000 mL | Freq: Every day | ORAL | Status: DC
  Administered 2020-02-25 – 2020-02-27 (×12): 237 mL
  Filled 2020-02-25 (×14): qty 237

## 2020-02-25 MED ORDER — PROSOURCE TF PO LIQD
90.0000 mL | Freq: Two times a day (BID) | ORAL | Status: DC
  Administered 2020-02-25 – 2020-02-27 (×4): 90 mL
  Filled 2020-02-25 (×5): qty 90

## 2020-02-25 NOTE — Progress Notes (Signed)
Initial Nutrition Assessment  INTERVENTION:   -Changed bolus regimen to Osmolite 1.5, 1 carton (237 ml) five time daily via PEG. - 90 ml Prosource TF BID via tube (or appropriate alternative), each providing 40 kcals and 11g protein -TF regimen provides 1935 kcals, 118g protein and 905 ml H2O  -Ensure Enlive po daily, each supplement provides 350 kcal and 20 grams of protein  NUTRITION DIAGNOSIS:   Increased nutrient needs related to hip fracture as evidenced by estimated needs.  Ongoing.  GOAL:   Patient will meet greater than or equal to 90% of their needs  Progressing.  MONITOR:   PO intake, Supplement acceptance, TF tolerance, Skin, Weight trends, Labs  ASSESSMENT:   Pt with a PMH significant for V. fib arrest and complete heart block s/p pacemaker placement in 2013, urethral stricture s/p suprapubic catheter placement and right cystic schwannoma removal in April 2021 postoperatively complicated by cranial nerve injury resulting in aspiration pneumonia requiring mechanical ventilation, tracheostomy placement and PEG tube placement. Pt admitted with R femoral neck fracture s/p fall.  RN reached out to RD via secure chat. Pt feeling full after 1.5 carton bolus feedings. Has been requesting 1 carton feedings.  RD adjusted TF regimen to lower the volume given at each bolus feedings. Increased Prosource to still meet nutritional needs.  Pt has been ordered Ensure supplements, 1 daily. Has not drank one for 2 days now. Pt can meet 100% of kcal needs if he drinks 1 Ensure a day.   Will continue to monitor tolerance. Per TOC note, pt to discharge to SNF when medically stable.  Admission weight: 155 lbs. Current weight: 156 lbs  Medications: Colace, Lactulose  Labs reviewed: CBGs: 95-110  Diet Order:   Diet Order            Diet regular Room service appropriate? Yes; Fluid consistency: Thin  Diet effective now                 EDUCATION NEEDS:   No education needs  have been identified at this time  Skin:  Skin Assessment: Skin Integrity Issues: Skin Integrity Issues:: Incisions Incisions: R hip  Last BM:  9/2  Height:   Ht Readings from Last 1 Encounters:  02/18/20 5\' 10"  (1.778 m)    Weight:   Wt Readings from Last 1 Encounters:  02/25/20 71.2 kg    BMI:  Body mass index is 22.52 kg/m.  Estimated Nutritional Needs:   Kcal:  2100-2400  Protein:  110-130 grams  Fluid:  >2L/d  Clayton Bibles, MS, RD, LDN Inpatient Clinical Dietitian Contact information available via Amion

## 2020-02-25 NOTE — TOC Progression Note (Addendum)
Transition of Care [facility]) - Progression Note    Patient Details  Name: Casey Richard MRN: [id number] Date of Birth: 1955-07-02  Transition of Care [facility]) CM/SW Contact  Jaqulyn Chancellor, Juliann Pulse, RN Phone Number: 02/25/2020, 1:05 PM  Clinical Narrative:Just noted not medically stable. [facility] aware. [place] health ref#[id number]/faxed clinicals [phone].       Expected Discharge Plan: [place] Barriers to Discharge: No Barriers Identified  Expected Discharge Plan and Services Expected Discharge Plan: [place]   Discharge Planning Services: CM Consult Post Acute Care Choice: [place] Living arrangements for the past 2 months:  (LTC)                                       Social Determinants of Health (SDOH) Interventions    Readmission Risk Interventions No flowsheet data found.

## 2020-02-25 NOTE — TOC Transition Note (Addendum)
Transition of Care [facility]) - CM/SW Discharge Note   Patient Details  Name: YUL DIANA MRN: [id number] Date of Birth: [birth year]  Transition of Care [facility]) CM/SW Contact:  Dessa Phi, RN Phone Number: 02/25/2020, 12:52 PM   Clinical Narrative: Patient chose [facility] for SNF-rep chantel-accepted, & has a bed today-awaiting covid results,patient does not want covid vaccine. Will check on nutrition supplements if facility can provide-osmolite,& prosource. Await d/c summary, & rm#,tel# for nsg report.[facility] for transport.[employer] medicare Josem Kaufmann is waived, [phone].    Final next level of care: Skilled Nursing Facility Barriers to Discharge: No Barriers Identified   Patient Goals and CMS Choice Patient states their goals for this hospitalization and ongoing recovery are:: go to new SNF CMS Medicare.gov Compare Post Acute Care list provided to:: Patient Choice offered to / list presented to : Patient  Discharge Placement              Patient chooses bed at: [facility] Patient to be transferred to facility by: [place] Name of family member notified: Bill brother Patient and family notified of of transfer: 02/25/20  Discharge Plan and Services   Discharge Planning Services: CM Consult Post Acute Care Choice: [place]                               Social Determinants of Health (SDOH) Interventions     Readmission Risk Interventions No flowsheet data found.

## 2020-02-25 NOTE — TOC Progression Note (Signed)
Transition of Care [facility]) - Progression Note    Patient Details  Name: Casey Richard MRN: [id number] Date of Birth: 03-Aug-1955  Transition of Care [facility]) CM/SW Contact  Morgin Halls, Juliann Pulse, RN Phone Number: 02/25/2020, 12:17 PM  Clinical Narrative: Patient/brother chose [facility] SNF-awaiting call back if able to accept today. Will need covid vaccine dates-[facility] rep checking @ [place] where aptient came from the LTC.awaiting covid results.      Expected Discharge Plan: [place] Barriers to Discharge: Continued Medical Work up  Expected Discharge Plan and Services Expected Discharge Plan: [place]   Discharge Planning Services: CM Consult Post Acute Care Choice: [place] Living arrangements for the past 2 months:  (LTC)                                       Social Determinants of Health (SDOH) Interventions    Readmission Risk Interventions No flowsheet data found.

## 2020-02-25 NOTE — Progress Notes (Signed)
Physical Therapy Treatment Patient Details Name: Casey Richard MRN: [id number] DOB: 04-Aug-1955 Today's Date: 02/25/2020    History of Present Illness 64 y.o. male with PMH craniotomy/resection of cystic schwannoma in APril 2021, CHB, NSVT s/p MDT PPM 2013, urethral stricture s/p suprapubic catheter placement, h/o PNA with trach/PEG tube placement presented for R hip fracture after tripping over his foley catheter. Pt underwent Rt hemiarthroplasty on 8/31, is now s/p gen change on PPM on 9/2, and has had trach removed on 9/3.    PT Comments    Assisted OOB to amb in hallway.  General bed mobility comments: cues to maintain posterior hip precautions, assist for Rt LE mobility and increased time. General transfer comment: 25% VC's on proper hand placement and to avoid hip flex > 90 degrees as well as safety with turns.General Gait Details: decreased amb distance using regular RW vs EVA which is expected.  50% VC's on proper walker to self distance, sequencing and safety with turns. Pt will need ST Rehab at SNF.    Follow Up Recommendations  SNF     Equipment Recommendations  Rolling walker with 5" wheels    Recommendations for Other Services       Precautions / Restrictions Precautions Precautions: Fall;Posterior Hip;ICD/Pacemaker Precaution Comments: pt able to recall 3/3 THP Restrictions Weight Bearing Restrictions: No RLE Weight Bearing: Weight bearing as tolerated    Mobility  Bed Mobility Overal bed mobility: Needs Assistance Bed Mobility: Supine to Sit     Supine to sit: Min assist;HOB elevated     General bed mobility comments: cues to maintain posterior hip precautions, assist for Rt LE mobility and increased time.  Transfers Overall transfer level: Needs assistance Equipment used: Rolling walker (2 wheeled) Transfers: Sit to/from [facility] Sit to Stand: From elevated surface;+2 safety/equipment;Min assist Stand pivot transfers: Mod assist;From  elevated surface;+2 safety/equipment       General transfer comment: 25% VC's on proper hand placement and to avoid hip flex > 90 degrees as well as safety with turns.  Ambulation/Gait Ambulation/Gait assistance: Mod assist;+2 safety/equipment Gait Distance (Feet): 38 Feet Assistive device: Rolling walker (2 wheeled) Gait Pattern/deviations: Step-to pattern;Decreased stance time - right;Drifts right/left;Narrow base of support Gait velocity: decreased   General Gait Details: decreased amb distance using regular RW vs EVA which is expected.  50% VC's on proper walker to self distance, sequencing and safety with turns.   Stairs             Wheelchair Mobility    Modified Rankin (Stroke Patients Only)       Balance                                            Cognition Arousal/Alertness: Awake/alert Behavior During Therapy: WFL for tasks assessed/performed Overall Cognitive Status: Within Functional Limits for tasks assessed                                 General Comments: AxO x 3 very motivated.      Exercises      General Comments        Pertinent Vitals/Pain Pain Assessment: Faces Faces Pain Scale: Hurts a little bit Pain Location: Rt hip Pain Descriptors / Indicators: Aching;Discomfort;Operative site guarding Pain Intervention(s): Monitored during session;Repositioned    Home Living  Prior Function            PT Goals (current goals can now be found in the care plan section) Progress towards PT goals: Progressing toward goals    Frequency    Min 3X/week      PT Plan Current plan remains appropriate    Co-evaluation              AM-PAC PT "6 Clicks" Mobility   Outcome Measure  Help needed turning from your back to your side while in a flat bed without using bedrails?: A Lot Help needed moving from lying on your back to sitting on the side of a flat bed without using  bedrails?: A Little Help needed moving to and from a bed to a chair (including a wheelchair)?: A Lot Help needed standing up from a chair using your arms (e.g., wheelchair or bedside chair)?: A Lot Help needed to walk in hospital room?: A Lot   6 Click Score: 11    End of Session Equipment Utilized During Treatment: Gait belt Activity Tolerance: Patient tolerated treatment well Patient left: in chair;with call bell/phone within reach;with chair alarm set Nurse Communication: Mobility status PT Visit Diagnosis: Muscle weakness (generalized) (M62.81);Difficulty in walking, not elsewhere classified (R26.2)     Time: 0100-7121 PT Time Calculation (min) (ACUTE ONLY): 25 min  Charges:  $Gait Training: 8-22 mins $Therapeutic Activity: 8-22 mins                     Rica Koyanagi  PTA Acute  Rehabilitation Services Pager      [phone] Office      [phone]

## 2020-02-25 NOTE — Consult Note (Signed)
   [facility] CM Inpatient Consult   02/25/2020  Casey Richard Jul 15, 1955 [id number]   Patient screened for high risk score for unplanned readmission. Chart reviewed to assess for potential [place] Management community service needs. Per review, patient is being recommended for a skilled nursing facility level of care.    No [facility] Care Management follow up needs at this time.  [name], MSN, [facility] Liaison Nurse Mobile Phone [phone]  Toll free office [phone]

## 2020-02-25 NOTE — Progress Notes (Signed)
Subjective:   Patient sitting up comfortably in bed. States right hip pain is moderate this morning, worse with movement. Denies any other complaints.  Objective:  PE: VITALS:   Vitals:   02/24/20 1355 02/24/20 2114 02/25/20 0500 02/25/20 0614  BP: 122/82 123/75  135/81  Pulse: 78 81  85  Resp: 16 16  18   Temp: 97.9 F (36.6 C) 98.8 F (37.1 C)  98.4 F (36.9 C)  TempSrc: Oral Oral  Oral  SpO2: 96% 97%  97%  Weight:   71.2 kg   Height:       General: Alert and oriented, sitting up in bed, in no acute distress Abdomen: soft, non-tender MSK: Surgical dressing clean with no drainage. 0-45 degrees AROM at right knee. EHL and FHL intact. Distal sensation intact.    LABS  Results for orders placed or performed during the hospital encounter of 02/17/20 (from the past 24 hour(s))  Glucose, capillary     Status: Abnormal   Collection Time: 02/24/20  8:07 AM  Result Value Ref Range   Glucose-Capillary 103 (H) 70 - 99 mg/dL   Comment 1 Notify RN    Comment 2 Document in Chart   Glucose, capillary     Status: Abnormal   Collection Time: 02/24/20 11:44 AM  Result Value Ref Range   Glucose-Capillary 139 (H) 70 - 99 mg/dL   Comment 1 Notify RN   Glucose, capillary     Status: Abnormal   Collection Time: 02/24/20  3:56 PM  Result Value Ref Range   Glucose-Capillary 104 (H) 70 - 99 mg/dL   Comment 1 Notify RN    Comment 2 Document in Chart   Glucose, capillary     Status: Abnormal   Collection Time: 02/24/20  8:10 PM  Result Value Ref Range   Glucose-Capillary 144 (H) 70 - 99 mg/dL  Glucose, capillary     Status: None   Collection Time: 02/25/20  3:18 AM  Result Value Ref Range   Glucose-Capillary 97 70 - 99 mg/dL  Basic metabolic panel     Status: Abnormal   Collection Time: 02/25/20  5:39 AM  Result Value Ref Range   Sodium 139 135 - 145 mmol/L   Potassium 4.2 3.5 - 5.1 mmol/L   Chloride 103 98 - 111 mmol/L   CO2 28 22 - 32 mmol/L   Glucose, Bld 110 (H) 70 - 99  mg/dL   BUN 19 8 - 23 mg/dL   Creatinine, Ser 0.58 (L) 0.61 - 1.24 mg/dL   Calcium 8.4 (L) 8.9 - 10.3 mg/dL   GFR calc non Af Amer >60 >60 mL/min   GFR calc Af Amer >60 >60 mL/min   Anion gap 8 5 - 15  CBC with Differential/Platelet     Status: Abnormal   Collection Time: 02/25/20  5:39 AM  Result Value Ref Range   WBC 10.5 4.0 - 10.5 K/uL   RBC 3.30 (L) 4.22 - 5.81 MIL/uL   Hemoglobin 9.3 (L) 13.0 - 17.0 g/dL   HCT 29.6 (L) 39 - 52 %   MCV 89.7 80.0 - 100.0 fL   MCH 28.2 26.0 - 34.0 pg   MCHC 31.4 30.0 - 36.0 g/dL   RDW 14.9 11.5 - 15.5 %   Platelets 347 150 - 400 K/uL   nRBC 0.0 0.0 - 0.2 %   Neutrophils Relative % 69 %   Neutro Abs 7.2 1.7 - 7.7 K/uL   Lymphocytes Relative 15 %  Lymphs Abs 1.6 0.7 - 4.0 K/uL   Monocytes Relative 14 %   Monocytes Absolute 1.5 (H) 0 - 1 K/uL   Eosinophils Relative 1 %   Eosinophils Absolute 0.2 0 - 0 K/uL   Basophils Relative 0 %   Basophils Absolute 0.0 0 - 0 K/uL   Immature Granulocytes 1 %   Abs Immature Granulocytes 0.07 0.00 - 0.07 K/uL  Magnesium     Status: None   Collection Time: 02/25/20  5:39 AM  Result Value Ref Range   Magnesium 2.1 1.7 - 2.4 mg/dL    No results found.  Assessment/Plan:  Weightbearing: WBAT RLE Insicional and dressing care: Reinforce as needed VTE prophylaxis: lovenox x 30 days Pain control: continue current regimen Follow - up plan: Follow up with Dr. Mardelle Matte in 2 weeks  Contact information:   Weekdays 8-5 Merlene [address]ing, [place] [phone]  After hours and holidays please check [email or web address] for group call information for [facility] [place] 02/25/2020, 7:31 AM

## 2020-02-25 NOTE — Progress Notes (Signed)
PROGRESS NOTE  Casey Richard JJO:[id number] DOB: [birth year] DOA: 02/17/2020 PCP: Benito Mccreedy, MD  Brief History   This 64 year old male with history of V. 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 Patient presented to ED status post fall in the evening 8/30,In the ED x-ray showed right femoral neck fracture, Dr. Danny Lawless from orthopedic was consulted,and patient is being admitted to [facility] for further EP cardiology evaluation prior to undergoing operative intervention. Patient underwent right hip hemiarthroplasty on 8/31 by orthopedics, postop he had hypotension, acute blood loss anemia likely perioperative blood loss received IV fluid bolus and 1 unit PRBC andHbstabilized. 02/20/20-PM generator changed at [facility] 02/21/20- trach decannulation by PCCM.  Now medically stable and awaiting SNF placement.   Interval History:  No events overnight.  Patient sitting up in chair at bedside this morning in no distress.  His diet has been slowly advanced and he has no acute concerns or complaints this morning.  He was also seen ambulating well in the hallway with physical therapy.  Plan is to await bed availability at SNF at this time.  Old records reviewed in assessment of this patient  Consultants  . Orthopedic Surgery . Cardiology  Procedures  . ORIF Right femoral neck fx 02/18/2020 . Trachestomy decannulated 02/21/2020 . Device generator replaced 02/20/2020  Antibiotics   Anti-infectives (From admission, onward)   Start     Dose/Rate Route Frequency Ordered Stop   02/20/20 2000  ceFAZolin (ANCEF) IVPB 1 g/50 mL premix        1 g 100 mL/hr over  30 Minutes Intravenous Every 6 hours 02/20/20 1753 02/21/20 1659   02/20/20 1400  ceFAZolin (ANCEF) IVPB 2g/100 mL premix        2 g 200 mL/hr over 30 Minutes Intravenous On call 02/20/20 0401 02/20/20 1432   02/20/20 1400  gentamicin (GARAMYCIN) 80 mg in sodium chloride 0.9 % 500 mL irrigation        80 mg Irrigation On call 02/20/20 0401 02/20/20 1433   02/20/20 0500  gentamicin (GARAMYCIN) 80 mg in sodium chloride 0.9 % 500 mL irrigation  Status:  Discontinued        80 mg Irrigation On call 02/20/20 0400 02/20/20 0401   02/20/20 0500  ceFAZolin (ANCEF) IVPB 2g/100 mL premix  Status:  Discontinued        2 g 200 mL/hr over 30 Minutes Intravenous On call 02/20/20 0400 02/20/20 0401   02/18/20 2032  ceFAZolin (ANCEF) 2-4 GM/100ML-% IVPB       Note to Pharmacy: Benjamin Stain   : cabinet override      02/18/20 2032 02/18/20 2040   02/18/20 2025  ceFAZolin (ANCEF) 1-4 GM/50ML-% IVPB  Status:  Discontinued       Note to Pharmacy: Benjamin Stain   : cabinet override      02/18/20 2025 02/18/20 2032   02/18/20 2015  ceFAZolin (ANCEF) IVPB 2g/100 mL premix        2 g 200 mL/hr over 30 Minutes Intravenous Every 6 hours 02/18/20 2007 02/19/20 0255   02/18/20 1100  ceFAZolin (ANCEF) IVPB 2g/100 mL premix        2 g 200 mL/hr over 30 Minutes Intravenous On call to O.R. 02/17/20  2315 02/18/20 1406    .  Subjective  The patient is resting comfortably. No new complaints.  Objective   Vitals:  Vitals:   02/25/20 0614 02/25/20 1349  BP: 135/81 126/77  Pulse: 85 84  Resp: 18 17  Temp: 98.4 F (36.9 C) 98.6 F (37 C)  SpO2: 97% 100%   Exam:  Constitutional:  . The patient is awake, alert, and oriented x 3. No acute distress. Respiratory:  . No increased work of breathing. . No wheezes, rales, or rhonchi . No tactile fremitus Cardiovascular:  . Regular rate and rhythm . No murmurs, ectopy, or gallups. . No lateral PMI. No thrills. Abdomen:  . Abdomen is soft, non-tender,  non-distended . No hernias, masses, or organomegaly . Normoactive bowel sounds.  Musculoskeletal:  . No cyanosis, clubbing, or edema Skin:  . No rashes, lesions, ulcers . palpation of skin: no induration or nodules Neurologic:  . CN 2-12 intact . Sensation all 4 extremities intact Psychiatric:  . Mental status o Mood, affect appropriate o Orientation to person, place, time  . judgment and insight appear intact  I have personally reviewed the following:   Today's Data  . Vitals, BMP, CBC  Imaging  . X-ray of the pelvis  Cardiology Data  . EKG  Scheduled Meds: . artificial tears   Right Eye Q4H  . bethanechol  10 mg Per Tube TID  . Chlorhexidine Gluconate Cloth  6 each Topical Daily  . docusate  100 mg Per Tube Daily  . enoxaparin (LOVENOX) injection  40 mg Subcutaneous Q24H  . feeding supplement (ENSURE ENLIVE)  237 mL Oral Q24H  . feeding supplement (OSMOLITE 1.5 CAL)  237 mL Per Tube 5 X Daily  . feeding supplement (PROSource TF)  90 mL Per Tube BID  . lactulose  20 g Per Tube BID  . pantoprazole sodium  40 mg Per Tube Daily  . QUEtiapine  25 mg Per Tube QHS   Continuous Infusions: . methocarbamol (ROBAXIN) IV      Principal Problem:   Closed displaced fracture of right femoral neck (HCC) Active Problems:   Complete heart block (HCC)   Pacemaker battery depletion   Dysphagia   Anastomotic stricture of urinary tract   Essential hypertension   LOS: 7 days   A & P  dds/p right hip hemiarthroplasty 8/31, appreciate orthopedic inputs. Continue WBAT RLE, reinforce dressing as needed, Lovenox 40 mg daily x 30 days for DVT prophylaxis, continue pain control and follow-up 2 weeks with with Dr. Mardelle Matte. Continue PT OT and SNF plan - stable for d/c when SNF available   Hypotension :Resolved. Suspect hypovolemia, also acute blood loss anemia peri operative . Improved after IV fluid bolus and blood transfusion 1 unit. Meds on hold. Will resumes once BP improves.    Acute blood loss anemia, perioperative: Estimated blood loss 300 ml in surgery. H&H trended down to 7.6 g received 1 unit PRBC 9/1- Hb at 8.7 gm, Recheck Hb 7.7,Transfuse 1 PRBC , follow up post transfusion Hb. Hemoglobin is stable at 9.3 today.  History of complete heart block / V. fib arrest /status post pacemaker . He was scheduled to undergo generator change next week on outpatient setting and EP has seenS/p gen change by cardio at [facility] 02/20/20. dressing change doneby cardiology.  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  Dysphagia/risk of aspiration: resulting from cranial nerve injury following schwannoma surgery in  2021,is on bolus PEG tube feeding at home and also takes by mouth. Continue dietitian on board.  Constipation: The patient has not had a BM in 5 days. Lactulose added.  History of cystic schwannoma: requiring craniotomy/tracheostomy. Tracheostomy removed 9/3.  Anastomotic stricture of urinary tract: Noted. The patient has chronic foley in place. Continue Foley in place.  Essential hypertension: Blood pressure medication on hold due to hypotension yesterday.  GERD: Continue PPI.  I have seen and examined this patient myself. I have spent 34 minutes in his evaluation and care.  DVT prophylaxis: Lovenox Code Status: Full Family Communication: none present Disposition Plan: Status is: Inpatient  Remains inpatient appropriate because:Unsafe d/c plan and Inpatient level of care appropriate due to severity of illness   Dispo: The patient is from: Home  Anticipated d/c is to: SNF  Anticipated d/c date is: 1 day  Patient currently is not medically stable to d/c.  Alyss Granato, DO [facility] Direct contact: see [email or web address]  7PM-7AM contact night coverage as  above 02/25/2020, 4:12 PM  LOS: 7 days

## 2020-02-26 LAB — BASIC METABOLIC PANEL
Anion gap: 12 (ref 5–15)
BUN: 21 mg/dL (ref 8–23)
CO2: 28 mmol/L (ref 22–32)
Calcium: 8.8 mg/dL — ABNORMAL LOW (ref 8.9–10.3)
Chloride: 101 mmol/L (ref 98–111)
Creatinine, Ser: 0.61 mg/dL (ref 0.61–1.24)
GFR calc Af Amer: >60 mL/min (ref >60)
GFR calc non Af Amer: >60 mL/min (ref >60)
Glucose, Bld: 118 mg/dL — ABNORMAL HIGH (ref 70–99)
Potassium: 4.1 mmol/L (ref 3.5–5.1)
Sodium: 141 mmol/L (ref 135–145)

## 2020-02-26 LAB — CBC WITH DIFFERENTIAL/PLATELET
Abs Immature Granulocytes: 0.08 10*3/uL — ABNORMAL HIGH (ref 0.00–0.07)
Basophils Absolute: 0 10*3/uL (ref 0.0–0.1)
Basophils Relative: 0 %
Eosinophils Absolute: 0.1 10*3/uL (ref 0.0–0.5)
Eosinophils Relative: 1 %
HCT: 29.4 % — ABNORMAL LOW (ref 39.0–52.0)
Hemoglobin: 9.4 g/dL — ABNORMAL LOW (ref 13.0–17.0)
Immature Granulocytes: 1 %
Lymphocytes Relative: 14 %
Lymphs Abs: 1.6 10*3/uL (ref 0.7–4.0)
MCH: 28.6 pg (ref 26.0–34.0)
MCHC: 32 g/dL (ref 30.0–36.0)
MCV: 89.4 fL (ref 80.0–100.0)
Monocytes Absolute: 1.5 10*3/uL — ABNORMAL HIGH (ref 0.1–1.0)
Monocytes Relative: 13 %
Neutro Abs: 8.3 10*3/uL — ABNORMAL HIGH (ref 1.7–7.7)
Neutrophils Relative %: 71 %
Platelets: 388 10*3/uL (ref 150–400)
RBC: 3.29 MIL/uL — ABNORMAL LOW (ref 4.22–5.81)
RDW: 15 % (ref 11.5–15.5)
WBC: 11.7 10*3/uL — ABNORMAL HIGH (ref 4.0–10.5)
nRBC: 0 % (ref 0.0–0.2)

## 2020-02-26 LAB — GLUCOSE, CAPILLARY
Glucose-Capillary: 100 mg/dL — ABNORMAL HIGH (ref 70–99)
Glucose-Capillary: 108 mg/dL — ABNORMAL HIGH (ref 70–99)
Glucose-Capillary: 110 mg/dL — ABNORMAL HIGH (ref 70–99)
Glucose-Capillary: 119 mg/dL — ABNORMAL HIGH (ref 70–99)
Glucose-Capillary: 131 mg/dL — ABNORMAL HIGH (ref 70–99)
Glucose-Capillary: 143 mg/dL — ABNORMAL HIGH (ref 70–99)
Glucose-Capillary: 151 mg/dL — ABNORMAL HIGH (ref 70–99)

## 2020-02-26 LAB — MAGNESIUM: Magnesium: 2 mg/dL (ref 1.7–2.4)

## 2020-02-26 MED ORDER — BISACODYL 10 MG RE SUPP
10.0000 mg | Freq: Once | RECTAL | Status: AC
  Administered 2020-02-26: 10 mg via RECTAL
  Filled 2020-02-26: qty 1

## 2020-02-26 NOTE — Progress Notes (Signed)
PROGRESS NOTE  EATHEN BUDREAU EQA:[id number] DOB: 21-Jan-1956 DOA: 02/17/2020 PCP: Benito Mccreedy, MD  Brief History   This 64 year old male with history of V. fib arrest /complete heart block with pacemaker in place since 2013 (now s/p generator change on 02/20/20), urethral stricture with suprapubic catheter, right cystic schwannoma surgery in April 9798 postop complicated by cranial nerve injury resulting in aspiration pneumonia, right facial droop and requiring mechanical ventilation, tracheostomy placement and PEG tube placement. Patient presented to ED status post fall in the evening 8/30,In the ED x-ray showed right femoral neck fracture, Dr. Danny Lawless from orthopedic was consulted,and patient is being admitted to [facility] for further EP cardiology evaluation prior to undergoing operative intervention. Patient underwent right hip hemiarthroplasty on 8/31 by orthopedics, postop he had hypotension, acute blood loss anemia likely perioperative blood loss received IV fluid bolus and 1 unit PRBC andHbstabilized. 02/20/20-PM generator changed at [facility] 02/21/20- trach decannulation by PCCM.  Now medically stable and awaiting SNF placement.   Interval History:  No events overnight.  Patient sitting up in chair at bedside this morning in no distress.  His diet has been slowly advanced and he has no acute concerns or complaints this morning.  He was also seen ambulating well in the hallway with physical therapy.  Plan is to await bed availability at SNF at this time.  Old records reviewed in assessment of this patient  Consultants  . Orthopedic Surgery . Cardiology  Procedures  . ORIF Right femoral neck fx 02/18/2020 . Trachestomy decannulated 02/21/2020 . Device generator replaced 02/20/2020  Antibiotics   Anti-infectives (From admission, onward)   Start     Dose/Rate Route Frequency Ordered Stop   02/20/20 2000  ceFAZolin (ANCEF) IVPB 1 g/50 mL premix        1 g 100 mL/hr over  30 Minutes Intravenous Every 6 hours 02/20/20 1753 02/21/20 1659   02/20/20 1400  ceFAZolin (ANCEF) IVPB 2g/100 mL premix        2 g 200 mL/hr over 30 Minutes Intravenous On call 02/20/20 0401 02/20/20 1432   02/20/20 1400  gentamicin (GARAMYCIN) 80 mg in sodium chloride 0.9 % 500 mL irrigation        80 mg Irrigation On call 02/20/20 0401 02/20/20 1433   02/20/20 0500  gentamicin (GARAMYCIN) 80 mg in sodium chloride 0.9 % 500 mL irrigation  Status:  Discontinued        80 mg Irrigation On call 02/20/20 0400 02/20/20 0401   02/20/20 0500  ceFAZolin (ANCEF) IVPB 2g/100 mL premix  Status:  Discontinued        2 g 200 mL/hr over 30 Minutes Intravenous On call 02/20/20 0400 02/20/20 0401   02/18/20 2032  ceFAZolin (ANCEF) 2-4 GM/100ML-% IVPB       Note to Pharmacy: Benjamin Stain   : cabinet override      02/18/20 2032 02/18/20 2040   02/18/20 2025  ceFAZolin (ANCEF) 1-4 GM/50ML-% IVPB  Status:  Discontinued       Note to Pharmacy: Benjamin Stain   : cabinet override      02/18/20 2025 02/18/20 2032   02/18/20 2015  ceFAZolin (ANCEF) IVPB 2g/100 mL premix        2 g 200 mL/hr over 30 Minutes Intravenous Every 6 hours 02/18/20 2007 02/19/20 0255   02/18/20 1100  ceFAZolin (ANCEF) IVPB 2g/100 mL premix        2 g 200 mL/hr over 30 Minutes Intravenous On call to O.R. 02/17/20  2315 02/18/20 1406     Subjective  The patient is resting comfortably. No new complaints.  Objective   Vitals:  Vitals:   02/26/20 0451 02/26/20 1219  BP: 133/82 112/77  Pulse: 81 94  Resp: 18 18  Temp: 98.4 F (36.9 C) 98.4 F (36.9 C)  SpO2: 98% 98%   Exam:  Constitutional:  . The patient is awake, alert, and oriented x 3. No acute distress. Respiratory:  . No increased work of breathing. . No wheezes, rales, or rhonchi . No tactile fremitus Cardiovascular:  . Regular rate and rhythm . No murmurs, ectopy, or gallups. . No lateral PMI. No thrills. Abdomen:  . Abdomen is soft, non-tender,  non-distended . No hernias, masses, or organomegaly . Normoactive bowel sounds.  Musculoskeletal:  . No cyanosis, clubbing, or edema Skin:  . No rashes, lesions, ulcers . palpation of skin: no induration or nodules Neurologic:  . CN 2-12 intact . Sensation all 4 extremities intact Psychiatric:  . Mental status o Mood, affect appropriate o Orientation to person, place, time  . judgment and insight appear intact  I have personally reviewed the following:   Today's Data  . Vitals, BMP, CBC  Imaging  . X-ray of the pelvis  Cardiology Data  . EKG  Scheduled Meds: . artificial tears   Right Eye Q4H  . bethanechol  10 mg Per Tube TID  . Chlorhexidine Gluconate Cloth  6 each Topical Daily  . docusate  100 mg Per Tube Daily  . enoxaparin (LOVENOX) injection  40 mg Subcutaneous Q24H  . feeding supplement (ENSURE ENLIVE)  237 mL Oral Q24H  . feeding supplement (OSMOLITE 1.5 CAL)  237 mL Per Tube 5 X Daily  . feeding supplement (PROSource TF)  90 mL Per Tube BID  . lactulose  20 g Per Tube BID  . pantoprazole sodium  40 mg Per Tube Daily  . QUEtiapine  25 mg Per Tube QHS   Continuous Infusions: . methocarbamol (ROBAXIN) IV      Principal Problem:   Closed displaced fracture of right femoral neck (HCC) Active Problems:   Complete heart block (HCC)   Pacemaker battery depletion   Dysphagia   Anastomotic stricture of urinary tract   Essential hypertension   LOS: 8 days   A & P  dds/p right hip hemiarthroplasty 8/31, appreciate orthopedic inputs. Continue WBAT RLE, reinforce dressing as needed, Lovenox 40 mg daily x 30 days for DVT prophylaxis, continue pain control and follow-up 2 weeks with with Dr. Mardelle Matte. Continue PT OT and SNF plan. The patient is stable for d/c when SNF available.   Hypotension :Resolved. Suspect hypovolemia, also acute blood loss anemia peri operative . Improved after IV fluid bolus and blood transfusion 1 unit. Meds on hold. Will resumes once  BP improves.   Acute blood loss anemia, perioperative: Estimated blood loss 300 ml in surgery. H&H trended down to 7.6 g received 1 unit PRBC 9/1- Hb at 8.7 gm, Recheck Hb 7.7,Transfuse 1 PRBC , follow up post transfusion Hb. Hemoglobin is stable at 9.3 today.  History of complete heart block / V. fib arrest /status post pacemaker . He was scheduled to undergo generator change next week on outpatient setting and EP has seenS/p gen change by [facility] at [facility] 02/20/20. dressing change doneby cardiology.  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  Dysphagia/risk of aspiration: resulting from cranial nerve injury following schwannoma surgery  in 2021,is on bolus PEG tube feeding at home and also takes by mouth. Continue dietitian on board.  Constipation: The patient has not had a BM in 5 days. Lactulose added.  History of cystic schwannoma: requiring craniotomy/tracheostomy. Tracheostomy removed 9/3.  Anastomotic stricture of urinary tract: Noted. The patient has chronic foley in place. Continue Foley in place.  Essential hypertension: Blood pressure medication on hold due to hypotension yesterday.  GERD: Continue PPI.  I have seen and examined this patient myself. I have spent 30 minutes in his evaluation and care.  DVT prophylaxis: Lovenox Code Status: Full Family Communication: none present Disposition Plan: SNF Status is: Inpatient  Remains inpatient appropriate because:Unsafe d/c plan and Inpatient level of care appropriate due to severity of illness  Dispo: The patient is from: Home  Anticipated d/c is to: SNF  Anticipated d/c date is: 1 day  Patient currently is not medically stable to d/c.  Calani Gick, DO [facility] Direct contact: see [email or web address]  7PM-7AM contact night coverage  as above 02/26/2020, 4:29 PM  LOS: 7 days

## 2020-02-27 DIAGNOSIS — S7291XS Unspecified fracture of right femur, sequela: Secondary | ICD-10-CM | POA: Diagnosis not present

## 2020-02-27 DIAGNOSIS — R262 Difficulty in walking, not elsewhere classified: Secondary | ICD-10-CM | POA: Diagnosis not present

## 2020-02-27 DIAGNOSIS — S72001A Fracture of unspecified part of neck of right femur, initial encounter for closed fracture: Secondary | ICD-10-CM | POA: Diagnosis not present

## 2020-02-27 DIAGNOSIS — S7291XA Unspecified fracture of right femur, initial encounter for closed fracture: Secondary | ICD-10-CM | POA: Diagnosis not present

## 2020-02-27 DIAGNOSIS — S049XXA Injury of unspecified cranial nerve, initial encounter: Secondary | ICD-10-CM | POA: Diagnosis not present

## 2020-02-27 DIAGNOSIS — Z9181 History of falling: Secondary | ICD-10-CM | POA: Diagnosis not present

## 2020-02-27 DIAGNOSIS — Z950 Presence of cardiac pacemaker: Secondary | ICD-10-CM | POA: Diagnosis not present

## 2020-02-27 DIAGNOSIS — Z96641 Presence of right artificial hip joint: Secondary | ICD-10-CM | POA: Diagnosis not present

## 2020-02-27 DIAGNOSIS — G4720 Circadian rhythm sleep disorder, unspecified type: Secondary | ICD-10-CM | POA: Diagnosis not present

## 2020-02-27 DIAGNOSIS — Z743 Need for continuous supervision: Secondary | ICD-10-CM | POA: Diagnosis not present

## 2020-02-27 DIAGNOSIS — R1310 Dysphagia, unspecified: Secondary | ICD-10-CM | POA: Diagnosis not present

## 2020-02-27 DIAGNOSIS — Z931 Gastrostomy status: Secondary | ICD-10-CM | POA: Diagnosis not present

## 2020-02-27 DIAGNOSIS — R339 Retention of urine, unspecified: Secondary | ICD-10-CM | POA: Diagnosis not present

## 2020-02-27 DIAGNOSIS — Z960 Presence of urogenital implants: Secondary | ICD-10-CM | POA: Diagnosis not present

## 2020-02-27 DIAGNOSIS — Z4732 Aftercare following explantation of hip joint prosthesis: Secondary | ICD-10-CM | POA: Diagnosis not present

## 2020-02-27 DIAGNOSIS — M2550 Pain in unspecified joint: Secondary | ICD-10-CM | POA: Diagnosis not present

## 2020-02-27 DIAGNOSIS — Z7401 Bed confinement status: Secondary | ICD-10-CM | POA: Diagnosis not present

## 2020-02-27 DIAGNOSIS — I10 Essential (primary) hypertension: Secondary | ICD-10-CM | POA: Diagnosis not present

## 2020-02-27 DIAGNOSIS — S79929A Unspecified injury of unspecified thigh, initial encounter: Secondary | ICD-10-CM | POA: Diagnosis not present

## 2020-02-27 DIAGNOSIS — Z431 Encounter for attention to gastrostomy: Secondary | ICD-10-CM | POA: Diagnosis not present

## 2020-02-27 DIAGNOSIS — S72001D Fracture of unspecified part of neck of right femur, subsequent encounter for closed fracture with routine healing: Secondary | ICD-10-CM | POA: Diagnosis not present

## 2020-02-27 LAB — BASIC METABOLIC PANEL
Anion gap: 8 (ref 5–15)
BUN: 28 mg/dL — ABNORMAL HIGH (ref 8–23)
CO2: 27 mmol/L (ref 22–32)
Calcium: 8.5 mg/dL — ABNORMAL LOW (ref 8.9–10.3)
Chloride: 102 mmol/L (ref 98–111)
Creatinine, Ser: 0.66 mg/dL (ref 0.61–1.24)
GFR calc Af Amer: >60 mL/min (ref >60)
GFR calc non Af Amer: >60 mL/min (ref >60)
Glucose, Bld: 144 mg/dL — ABNORMAL HIGH (ref 70–99)
Potassium: 3.9 mmol/L (ref 3.5–5.1)
Sodium: 137 mmol/L (ref 135–145)

## 2020-02-27 LAB — GLUCOSE, CAPILLARY
Glucose-Capillary: 103 mg/dL — ABNORMAL HIGH (ref 70–99)
Glucose-Capillary: 97 mg/dL (ref 70–99)
Glucose-Capillary: 105 mg/dL — ABNORMAL HIGH (ref 70–99)
Glucose-Capillary: 95 mg/dL (ref 70–99)

## 2020-02-27 LAB — CBC WITH DIFFERENTIAL/PLATELET
Abs Immature Granulocytes: 0.11 10*3/uL — ABNORMAL HIGH (ref 0.00–0.07)
Basophils Absolute: 0.1 10*3/uL (ref 0.0–0.1)
Basophils Relative: 0 %
Eosinophils Absolute: 0.2 10*3/uL (ref 0.0–0.5)
Eosinophils Relative: 1 %
HCT: 30.6 % — ABNORMAL LOW (ref 39.0–52.0)
Hemoglobin: 9.4 g/dL — ABNORMAL LOW (ref 13.0–17.0)
Immature Granulocytes: 1 %
Lymphocytes Relative: 11 %
Lymphs Abs: 1.5 10*3/uL (ref 0.7–4.0)
MCH: 27.6 pg (ref 26.0–34.0)
MCHC: 30.7 g/dL (ref 30.0–36.0)
MCV: 89.7 fL (ref 80.0–100.0)
Monocytes Absolute: 2.1 10*3/uL — ABNORMAL HIGH (ref 0.1–1.0)
Monocytes Relative: 15 %
Neutro Abs: 10.3 10*3/uL — ABNORMAL HIGH (ref 1.7–7.7)
Neutrophils Relative %: 72 %
Platelets: 409 10*3/uL — ABNORMAL HIGH (ref 150–400)
RBC: 3.41 MIL/uL — ABNORMAL LOW (ref 4.22–5.81)
RDW: 15.2 % (ref 11.5–15.5)
WBC: 14.2 10*3/uL — ABNORMAL HIGH (ref 4.0–10.5)
nRBC: 0 % (ref 0.0–0.2)

## 2020-02-27 LAB — MAGNESIUM: Magnesium: 2.2 mg/dL (ref 1.7–2.4)

## 2020-02-27 MED ORDER — ENOXAPARIN SODIUM 40 MG/0.4ML ~~LOC~~ SOLN: 40.0000 mg | SUBCUTANEOUS | 0 refills | Status: DC

## 2020-02-27 MED ORDER — POLYETHYLENE GLYCOL 3350 17 G PO PACK: 17.0000 g | PACK | Freq: Every day | ORAL | 0 refills | Status: DC

## 2020-02-27 MED ORDER — HYDROCODONE-ACETAMINOPHEN 7.5-325 MG PO TABS: 1.0000 | ORAL_TABLET | ORAL | 0 refills | Status: DC | PRN

## 2020-02-27 MED ORDER — ACETAMINOPHEN 325 MG PO TABS: 325.0000 mg | ORAL_TABLET | ORAL | 0 refills | Status: DC | PRN

## 2020-02-27 NOTE — Progress Notes (Signed)
Physical Therapy Treatment Patient Details Name: Casey Richard MRN: [id number] DOB: 08/29/1955 Today's Date: 02/27/2020    History of Present Illness 64 y.o. male with PMH craniotomy/resection of cystic schwannoma in APril 2021, CHB, NSVT s/p MDT PPM 2013, urethral stricture s/p suprapubic catheter placement, h/o PNA with trach/PEG tube placement presented for R hip fracture after tripping over his foley catheter. Pt underwent Rt hemiarthroplasty on 8/31, is now s/p gen change on PPM on 9/2, and has had trach removed on 9/3.    PT Comments    Patient is progressing well with therapy and increased gait distance today with RW. He continues to require cues for safe proximity to RW and safe step length with Rt LE and min assist for walker management. He was limited by parastesia in Lt UE and took ~3 standing breaks to relieve pressure on Lt UE. Exercise for strength and ROM performed at EOS. Acute PT will continue to progress as able.   Follow Up Recommendations  SNF     Equipment Recommendations  Rolling walker with 5" wheels    Recommendations for Other Services       Precautions / Restrictions Precautions Precautions: Fall;Posterior Hip;ICD/Pacemaker Precaution Comments: pt able to recall 3/3 THP Restrictions Weight Bearing Restrictions: No RLE Weight Bearing: Weight bearing as tolerated    Mobility  Bed Mobility Overal bed mobility: Needs Assistance Bed Mobility: Supine to Sit     Supine to sit: Min assist;HOB elevated     General bed mobility comments: cues for posterior hip precautions. min assist to move Rt LE off EOB and raise trunk.   Transfers Overall transfer level: Needs assistance Equipment used: Rolling walker (2 wheeled) Transfers: Sit to/from Stand Sit to Stand: From elevated surface;+2 safety/equipment;Min assist         General transfer comment: cues for safe hand placement/technique with RW for power up. No overt LOB noted with rising. min assist to  rise and steady.  Ambulation/Gait Ambulation/Gait assistance: Min assist;+2 safety/equipment   Assistive device: Rolling walker (2 wheeled) Gait Pattern/deviations: Step-to pattern;Decreased stance time - right;Drifts right/left;Narrow base of support Gait velocity: decreased   General Gait Details: pt increased gait distance from last session. pt stopping for breaks due to Lt UE discomfort with c/o "pins and needles". Pt required repeated cues for safe step length.    Stairs             Wheelchair Mobility    Modified Rankin (Stroke Patients Only)       Balance Overall balance assessment: Needs assistance Sitting-balance support: Feet supported Sitting balance-Leahy Scale: Fair     Standing balance support: During functional activity;Bilateral upper extremity supported Standing balance-Leahy Scale: Poor             Cognition Arousal/Alertness: Awake/alert Behavior During Therapy: WFL for tasks assessed/performed Overall Cognitive Status: Within Functional Limits for tasks assessed Area of Impairment: Memory                     Memory: Decreased short-term memory         General Comments: AxO x 3 very motivated.      Exercises Total Joint Exercises Ankle Circles/Pumps: AROM;Both;15 reps;Seated Quad Sets: AROM;Right;10 reps;Seated Hip ABduction/ADduction: AAROM;Right;10 reps;Seated    General Comments        Pertinent Vitals/Pain Pain Assessment: 0-10 Pain Score: 5  Pain Location: Rt hip Pain Descriptors / Indicators: Aching;Discomfort;Operative site guarding Pain Intervention(s): Limited activity within patient's tolerance;Monitored during session;Repositioned;Ice applied  PT Goals (current goals can now be found in the care plan section) Acute Rehab PT Goals Patient Stated Goal: improve mobility and get recovered to get home PT Goal Formulation: With patient Time For Goal Achievement: 03/06/20 Potential to Achieve  Goals: Good Progress towards PT goals: Progressing toward goals    Frequency    Min 3X/week      PT Plan Current plan remains appropriate       AM-PAC PT "6 Clicks" Mobility   Outcome Measure  Help needed turning from your back to your side while in a flat bed without using bedrails?: A Little Help needed moving from lying on your back to sitting on the side of a flat bed without using bedrails?: A Little Help needed moving to and from a bed to a chair (including a wheelchair)?: A Little Help needed standing up from a chair using your arms (e.g., wheelchair or bedside chair)?: A Little Help needed to walk in hospital room?: A Little Help needed climbing 3-5 steps with a railing? : A Lot 6 Click Score: 17    End of Session Equipment Utilized During Treatment: Gait belt Activity Tolerance: Patient tolerated treatment well Patient left: in chair;with call bell/phone within reach;with chair alarm set Nurse Communication: Mobility status PT Visit Diagnosis: Muscle weakness (generalized) (M62.81);Difficulty in walking, not elsewhere classified (R26.2)     Time: 8295-6213 PT Time Calculation (min) (ACUTE ONLY): 28 min  Charges:  $Gait Training: 8-22 mins $Therapeutic Exercise: 8-22 mins                     Verner Mould, DPT Acute Rehabilitation Services  Office [phone] Pager [phone]  02/27/2020 12:57 PM

## 2020-02-27 NOTE — TOC Progression Note (Addendum)
Transition of Care [facility]) - Progression Note    Patient Details  Name: Casey Richard MRN: [id number] Date of Birth: 01/24/1956  Transition of Care [facility]) CM/SW Contact  Leeroy Cha, RN Phone Number: 02/27/2020, 2:44 PM  Clinical Narrative:    covid test on [id number] is neg. TCT-[place] at [facility] to check to see if bed available.. Awaiting dc summary from md to dc patient.  Expected Discharge Plan: [place] Barriers to Discharge: No Barriers Identified  Expected Discharge Plan and Services Expected Discharge Plan: [place]   Discharge Planning Services: CM Consult Post Acute Care Choice: [place] Living arrangements for the past 2 months:  (LTC)                                       Social Determinants of Health (SDOH) Interventions    Readmission Risk Interventions No flowsheet data found.

## 2020-02-27 NOTE — Care Management Important Message (Signed)
Important Message  Patient Details IM Letter given to the Patient Name: Casey Richard MRN: [id number] Date of Birth: [birth year]   Medicare Important Message Given:  Yes     Kerin Salen 02/27/2020, 4:53 PM

## 2020-02-27 NOTE — TOC Transition Note (Signed)
Transition of Care [facility]) - CM/SW Discharge Note   Patient Details  Name: Casey Richard MRN: [id number] Date of Birth: November 28, 1955  Transition of Care [facility]) CM/SW Contact:  Leeroy Cha, RN Phone Number: 02/27/2020, 4:01 PM   Clinical Narrative:    Transferred to [facility] via [facility] called for transport at 1545. Packet given to [place] the floor rn with transport form. Dc summary and covid test faxed to [place].   Final next level of care: Skilled Nursing Facility Barriers to Discharge: No Barriers Identified   Patient Goals and CMS Choice Patient states their goals for this hospitalization and ongoing recovery are:: go to new SNF CMS Medicare.gov Compare Post Acute Care list provided to:: Patient Choice offered to / list presented to : Patient  Discharge Placement              Patient chooses bed at: [facility] Patient to be transferred to facility by: [place] Name of family member notified: Bill brother Patient and family notified of of transfer: 02/25/20  Discharge Plan and Services   Discharge Planning Services: CM Consult Post Acute Care Choice: [place]                               Social Determinants of Health (SDOH) Interventions     Readmission Risk Interventions No flowsheet data found.

## 2020-02-27 NOTE — Discharge Summary (Signed)
Physician Discharge Summary  ANES RIGEL OZD:[id number] DOB: September 06, 1955 DOA: 02/17/2020  PCP: Benito Mccreedy, MD  Admit date: 02/17/2020 Discharge date: 02/27/2020  Recommendations for Outpatient Follow-up:  1. The patient is discharged to SNF for rehab. 2. Check Chemistry in one week and report to facility physician. 3. Follow up with PCP in 7-10 days after discharge from SNF. 4. Follow up with orthopedic surgery in one week.   Contact information for follow-up providers    Marchia Bond, MD. Schedule an appointment as soon as possible for a visit in 2 weeks.   Specialty: Orthopedic Surgery Contact information: [address] [place] [address] [phone]        [facility]. Call.   Specialty: Respiratory Therapy Why: if stoma not completely closed after 2 weeks call and we can refer you to ENT if needed.  Contact information: [address] [phone] [place]       Sanda Klein, MD Follow up on 05/25/2020.   Specialty: Cardiology Why: at 0800 in Person  Contact information: [address] [place] [address] [phone]        [place] [facility] Follow up on 02/25/2020.   Specialty: Cardiology Why: virtual visit with device clinic - if you use smart phone, I phone so they can see the site.  Contact information: [address] [place] [phone]           Contact information for after-discharge care    Destination    [facility] .   Service: Skilled Nursing Contact information: [address] [place] [phone]                 Discharge Diagnoses: Principal diagnosis is #1 Closed displaced fracture of right femoral neck (HCC) Complete heart block ([place]) Pacemaker battery depletion Dysphagia Anastomotic stricture of  urinary tract Essential hypertension  Discharge Condition: Fair  Disposition: SNF  Diet recommendation: Regular diet with tube feeds  Filed Weights   02/25/20 0500 02/26/20 0459 02/27/20 0500  Weight: 71.2 kg 70.7 kg 71.6 kg    History of present illness:  64 year old male with past medical history of V. 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  Patient explains that earlier in the evening on 8/30 patient tripped on his Foley catheter tubing, landing on his right hip.  Immediately after the fall the patient felt severe right hip pain, sharp in quality, nonradiating and severe in intensity.  Pain is worse with any movement whatsoever without alleviating factors.  Pain was ongoing for several hours and so the patient was eventually brought to [facility] emergency department for evaluation.  Upon evaluation in the emergency department x-ray revealed right femoral neck fracture.  Case was discussed with Dr. Mardelle Matte who stated that he may be able to take the patient to the OR on 8/31.  Due to the patient's complicated medical history the hospitalist group has been called to assess the patient for admission of the hospital.  Hospital Course:  This 64 year old male with history of V. fib arrest /complete heart block with pacemaker in place  since 2013 (now s/p generator change on 02/20/20), urethral stricture with suprapubic catheter, right cystic schwannoma surgery in April 7341 postop complicated by cranial nerve injury resulting in aspiration pneumonia, right facial droop and requiring mechanical  ventilation, tracheostomy placement and PEG tube placement. Patient presented to ED status post fall in the evening 8/30,In the ED x-ray showed right femoral neck fracture, Dr. Danny Lawless from orthopedic was consulted,and patient is being admitted to [facility] for further EP cardiology evaluation prior to undergoing operative intervention. Patient underwent right hip hemiarthroplasty on 8/31 by orthopedics, postop he had hypotension, acute blood loss anemia likely perioperative blood loss received IV fluid bolus and 1 unit PRBC andHbstabilized. 02/20/20-PM generator changed at [facility] 02/21/20- trach decannulation by PCCM.  Now medically stable and awaiting SNF placement.  Today's assessment: S: The patient is resting comfortably. No new complaints. O: Vitals:  Vitals:   02/27/20 0638 02/27/20 1342  BP: 121/72 111/76  Pulse: 77 82  Resp: 14 18  Temp: 98.1 F (36.7 C) 98 F (36.7 C)  SpO2: 98% 96%   Exam:  Constitutional:   The patient is awake, alert, and oriented x 3. No acute distress. Respiratory:   No increased work of breathing.  No wheezes, rales, or rhonchi  No tactile fremitus Cardiovascular:   Regular rate and rhythm  No murmurs, ectopy, or gallups.  No lateral PMI. No thrills. Abdomen:   Abdomen is soft, non-tender, non-distended  No hernias, masses, or organomegaly  Normoactive bowel sounds.  Musculoskeletal:   No cyanosis, clubbing, or edema Skin:   No rashes, lesions, ulcers  palpation of skin: no induration or nodules Neurologic:   CN 2-12 intact  Sensation all 4 extremities intact Psychiatric:   Mental status ? Mood, affect appropriate ? Orientation to person, place, time   judgment and insight appear intact  Discharge Instructions  Discharge Instructions    Activity as tolerated - No restrictions   Complete by: As directed    Call MD for:  redness, tenderness, or signs of infection (pain, swelling, redness, odor or  green/yellow discharge around incision site)   Complete by: As directed    Call MD for:  severe uncontrolled pain   Complete by: As directed    Call MD for:  temperature >100.4   Complete by: As directed    Diet regular   Complete by: As directed    Discharge instructions   Complete by: As directed    The patient is discharged to SNF for rehab. Check Chemistry in one week and report to facility physician. Follow up with PCP in 7-10 days after discharge from SNF. Follow up with orthopedic surgery in one week.   Increase activity slowly   Complete by: As directed    No wound care   Complete by: As directed      Allergies as of 02/27/2020   No Known Allergies     Medication List    STOP taking these medications   amLODipine 10 MG tablet Commonly known as: NORVASC   bethanechol 10 MG tablet Commonly known as: URECHOLINE   erythromycin ophthalmic ointment   free water Soln   heparin 5000 UNIT/ML injection   ipratropium-albuterol 0.5-2.5 (3) MG/3ML Soln Commonly known as: DUONEB   lisinopril 5 MG tablet Commonly known as: ZESTRIL   multivitamin with minerals Tabs tablet     TAKE these medications   acetaminophen 325 MG tablet Commonly known as: TYLENOL Take 1-2 tablets (325-650 mg total) by mouth every 4 (  four) hours as needed for mild pain.   artificial tears Oint ophthalmic ointment Commonly known as: LACRILUBE Place into the right eye every 4 (four) hours.   docusate 50 MG/5ML liquid Commonly known as: COLACE Place 10 mLs (100 mg total) into feeding tube daily.   enoxaparin 40 MG/0.4ML injection Commonly known as: LOVENOX Inject 0.4 mLs (40 mg total) into the skin daily for 21 days. Start taking on: February 28, 2020   feeding supplement (PRO-STAT SUGAR FREE 64) Liqd Place 30 mLs into feeding tube 2 (two) times daily.   HM Vitamin D3 100 MCG (4000 UT) Caps Generic drug: Cholecalciferol Take 1 capsule by mouth daily.   HYDROcodone-acetaminophen  7.5-325 MG tablet Commonly known as: NORCO Take 1-2 tablets by mouth every 4 (four) hours as needed for moderate pain or severe pain.   Isosource 1.5 Cal Liqd 1,000 mLs by PEG Tube route daily.   pantoprazole sodium 40 mg/20 mL Pack Commonly known as: PROTONIX Place 20 mLs (40 mg total) into feeding tube daily.   polyethylene glycol 17 g packet Commonly known as: MIRALAX / GLYCOLAX Take 17 g by mouth daily.   QUEtiapine 25 MG tablet Commonly known as: SEROQUEL Place 1 tablet (25 mg total) into feeding tube at bedtime.   vitamin B-12 1000 MCG tablet Commonly known as: CYANOCOBALAMIN Take 2,000 mcg by mouth daily.      No Known Allergies  The results of significant diagnostics from this hospitalization (including imaging, microbiology, ancillary and laboratory) are listed below for reference.    Significant Diagnostic Studies: Pelvis Portable  Result Date: 02/18/2020 CLINICAL DATA:  Status post right hip replacement EXAM: PORTABLE PELVIS 1-2 VIEWS COMPARISON:  02/17/2020 FINDINGS: Pubic symphysis and rami appear intact. Left femoral head projects in joint. Status post right hip replacement with normal alignment. Possible small osseous fragment inferior to the femoral neck region of the prosthetic. IMPRESSION: Status post right hip replacement with normal alignment and intact hardware. Electronically Signed   By: Donavan Foil M.D.   On: 02/18/2020 19:36   EP PPM/ICD IMPLANT  Result Date: 02/20/2020 1. Dual chamber pacemaker generator changeout with Aegis pouch 2. Light sedation Reason for procedure: 1. Device generator at elective replacement interval 2. Complete heart block Procedure performed by: Sanda Klein, MD Complications: None Estimated blood loss: <5 mL Medications administered during procedure: Ancef 2 g intravenously,  lidocaine 1% 30 mL locally, fentanyl 25 mcg intravenously Device details: New Generator Medtronic Azure XT DR model number [id number], serial number  [id number] Right atrial lead (chronic) Medtronic , model number [id number], serial number [id number] (implanted 07/15/2011) Right ventricular lead (chronic)  Medtronic, model number [id number], serial number [id number] (implanted 07/15/2011) Explanted generator Medtronic Revo,  model number [id number], serial number  [id number] (implanted 07/15/2011) Procedure details: After the risks and benefits of the procedure were discussed the patient provided informed consent. She was brought to the cardiac catheter lab in the fasting state. The patient was prepped and draped in usual sterile fashion. Local anesthesia with 1% lidocaine was administered to to the left infraclavicular area. A 5-6cm horizontal incision was made parallel with and 2-3 cm caudal to the right clavicle, in the area of an old scar.  Using minimal electrocautery and mostly sharp and blunt dissection the prepectoral pocket was opened carefully to avoid injury to the loops of chronic leads. Extensive dissection was not necessary. The device was explanted. The pocket was carefully inspected for hemostasis and flushed with copious amounts of antibiotic solution. The leads were  disconnected from the old generator and the new generator was connected to the chronic leads, with appropriate pacing noted. Testing of the lead parameters via telemetry showed excellent values. T The entire system was then inserted in the pre-soaked Aegis pouch and carefully inserted in the pocket with care been taking that the leads and device assumed a comfortable position without pressure on the incision. Great care was taken that the leads be located deep to the generator. The pocket was then closed in layers using 2 layers of 2-0 Vicryl, one layer of 3-0 Vicryl,  cutaneous steristrips, after which a sterile dressing was applied. At the end of the procedure the following lead parameters were encountered: Right atrial lead sensed P waves 3.4 mV, impedance 399 ohms, threshold 0.5 at 0.4 ms pulse  width. Right ventricular lead sensed R waves  None, impedance 418 ohms, threshold 0.5 at 0.4 ms pulse width.  CUP PACEART INCLINIC DEVICE CHECK  Result Date: 02/14/2020 PPM at RRT. Normal device function.  Device reprogrammed back to DDD 70 today.  Gen change scheduled.  DG Hip Port Unilat With Pelvis 1V Right  Result Date: 02/18/2020 CLINICAL DATA:  Status post hip replacement EXAM: DG HIP (WITH OR WITHOUT PELVIS) 1V PORT RIGHT COMPARISON:  02/17/2020 FINDINGS: Pubic symphysis and rami are intact. Interval right hip replacement with intact hardware and normal alignment. Gas in the soft tissues consistent with recent surgery. IMPRESSION: Interval right hip replacement with expected postsurgical change. Electronically Signed   By: Donavan Foil M.D.   On: 02/18/2020 19:35   DG Hip Unilat  With Pelvis 2-3 Views Right  Result Date: 02/17/2020 CLINICAL DATA:  Tripped, right hip pain EXAM: DG HIP (WITH OR WITHOUT PELVIS) 2-3V RIGHT COMPARISON:  None. FINDINGS: Frontal view of the pelvis as well as frontal and frogleg lateral views of the right hip are obtained. There is a comminuted displaced basicervical right femoral neck fracture with mild impaction and valgus angulation at the fracture site. No dislocation. Remainder of the bony pelvis is unremarkable. IMPRESSION: 1. Comminuted mildly displaced right femoral neck fracture. Electronically Signed   By: Randa Ngo M.D.   On: 02/17/2020 21:28    Microbiology: Recent Results (from the past 240 hour(s))  SARS Coronavirus 2 by RT PCR (hospital order, performed in [facility] hospital lab) Nasopharyngeal Nasopharyngeal Swab     Status: None   Collection Time: 02/17/20  9:47 PM   Specimen: Nasopharyngeal Swab  Result Value Ref Range Status   SARS Coronavirus 2 NEGATIVE NEGATIVE Final    Comment: (NOTE) SARS-CoV-2 target nucleic acids are NOT DETECTED.  The SARS-CoV-2 RNA is generally detectable in upper and lower respiratory specimens during the  acute phase of infection. The lowest concentration of SARS-CoV-2 viral copies this assay can detect is 250 copies / mL. A negative result does not preclude SARS-CoV-2 infection and should not be used as the sole basis for treatment or other patient management decisions.  A negative result may occur with improper specimen collection / handling, submission of specimen other than nasopharyngeal swab, presence of viral mutation(s) within the areas targeted by this assay, and inadequate number of viral copies (<250 copies / mL). A negative result must be combined with clinical observations, patient history, and epidemiological information.  Fact Sheet for Patients:   StrictlyIdeas.no  Fact Sheet for Healthcare Providers: BankingDealers.co.za  This test is not yet approved or  cleared by the [place] FDA and has been authorized for detection and/or diagnosis of SARS-CoV-2 by  FDA under an Emergency Use Authorization (EUA).  This EUA will remain in effect (meaning this test can be used) for the duration of the COVID-19 declaration under Section 564(b)(1) of the Act, 21 U.S.C. section 360bbb-3(b)(1), unless the authorization is terminated or revoked sooner.  Performed at [facility], [place] [address], [place], [place] [address]   MRSA PCR Screening     Status: None   Collection Time: 02/18/20  9:14 PM   Specimen: Nasal Mucosa; Nasopharyngeal  Result Value Ref Range Status   MRSA by PCR NEGATIVE NEGATIVE Final    Comment:        The GeneXpert MRSA Assay (FDA approved for NASAL specimens only), is one component of a comprehensive MRSA colonization surveillance program. It is not intended to diagnose MRSA infection nor to guide or monitor treatment for MRSA infections. Performed at [facility], [place] [address], [place], [place] [address]   Surgical PCR screen     Status: None   Collection Time:  02/20/20  4:38 AM   Specimen: Nasal Mucosa; Nasal Swab  Result Value Ref Range Status   MRSA, PCR NEGATIVE NEGATIVE Final   Staphylococcus aureus NEGATIVE NEGATIVE Final    Comment: (NOTE) The Xpert SA Assay (FDA approved for NASAL specimens in patients 66 years of age and older), is one component of a comprehensive surveillance program. It is not intended to diagnose infection nor to guide or monitor treatment. Performed at [facility], [place] [address], [place], [place] [address]   SARS Coronavirus 2 by RT PCR (hospital order, performed in [facility] hospital lab) Nasopharyngeal Nasopharyngeal Swab     Status: None   Collection Time: 02/25/20 12:10 PM   Specimen: Nasopharyngeal Swab  Result Value Ref Range Status   SARS Coronavirus 2 NEGATIVE NEGATIVE Final    Comment: (NOTE) SARS-CoV-2 target nucleic acids are NOT DETECTED.  The SARS-CoV-2 RNA is generally detectable in upper and lower respiratory specimens during the acute phase of infection. The lowest concentration of SARS-CoV-2 viral copies this assay can detect is 250 copies / mL. A negative result does not preclude SARS-CoV-2 infection and should not be used as the sole basis for treatment or other patient management decisions.  A negative result may occur with improper specimen collection / handling, submission of specimen other than nasopharyngeal swab, presence of viral mutation(s) within the areas targeted by this assay, and inadequate number of viral copies (<250 copies / mL). A negative result must be combined with clinical observations, patient history, and epidemiological information.  Fact Sheet for Patients:   StrictlyIdeas.no  Fact Sheet for Healthcare Providers: BankingDealers.co.za  This test is not yet approved or  cleared by the [place] FDA and has been authorized for detection and/or diagnosis of SARS-CoV-2 by FDA under an  Emergency Use Authorization (EUA).  This EUA will remain in effect (meaning this test can be used) for the duration of the COVID-19 declaration under Section 564(b)(1) of the Act, 21 U.S.C. section 360bbb-3(b)(1), unless the authorization is terminated or revoked sooner.  Performed at [facility], [place] [address], [place],  [address]      Labs: Basic Metabolic Panel: Recent Labs  Lab 02/23/20 0552 02/24/20 0558 02/25/20 0539 02/26/20 0542 02/27/20 0527  NA 140 137 139 141 137  K 4.3 4.2 4.2 4.1 3.9  CL 103 100 103 101 102  CO2 30 27 28 28 27   GLUCOSE 103* 108* 110* 118* 144*  BUN 16  17 19 21  28*  CREATININE 0.59* 0.62 0.58* 0.61 0.66  CALCIUM 8.4* 8.5* 8.4* 8.8* 8.5*  MG  --  2.0 2.1 2.0 2.2  PHOS  --  4.0  --   --   --    Liver Function Tests: No results for input(s): AST, ALT, ALKPHOS, BILITOT, PROT, ALBUMIN in the last 168 hours. No results for input(s): LIPASE, AMYLASE in the last 168 hours. No results for input(s): AMMONIA in the last 168 hours. CBC: Recent Labs  Lab 02/23/20 0552 02/23/20 0854 02/23/20 2200 02/24/20 0558 02/25/20 0539 02/26/20 0542 02/27/20 0527  WBC 8.1  --   --  10.1 10.5 11.7* 14.2*  NEUTROABS  --   --   --   --  7.2 8.3* 10.3*  HGB 7.7*   < > 9.0* 8.7* 9.3* 9.4* 9.4*  HCT 25.1*   < > 29.0* 27.3* 29.6* 29.4* 30.6*  MCV 90.6  --   --  89.2 89.7 89.4 89.7  PLT 231  --   --  292 347 388 409*   < > = values in this interval not displayed.   Cardiac Enzymes: No results for input(s): CKTOTAL, CKMB, CKMBINDEX, TROPONINI in the last 168 hours. BNP: BNP (last 3 results) No results for input(s): BNP in the last 8760 hours.  ProBNP (last 3 results) No results for input(s): PROBNP in the last 8760 hours.  CBG: Recent Labs  Lab 02/26/20 2035 02/26/20 2355 02/27/20 0359 02/27/20 0752 02/27/20 1113  GLUCAP 131* 119* 103* 97 105*    Principal Problem:   Closed displaced fracture of right femoral neck  (HCC) Active Problems:   Complete heart block (HCC)   Pacemaker battery depletion   Dysphagia   Anastomotic stricture of urinary tract   Essential hypertension   Time coordinating discharge: 38 minutes.  Signed:        Koltyn Kelsay, DO [facility]  02/27/2020, 2:58 PM

## 2020-02-27 NOTE — Progress Notes (Signed)
Report called to [facility], [phone]. Pt updated and ready for transport. Awaiting PTAR. Discharge summary not received by [facility]; refaxed to [phone]. Coolidge Breeze, RN 02/27/2020

## 2020-02-28 NOTE — TOC Progression Note (Signed)
Transition of Care [facility]) - Progression Note    Patient Details  Name: Casey Richard MRN: [id number] Date of Birth: 05-10-1956  Transition of Care [facility]) CM/SW [place], RN Phone Number: [phone]  02/28/2020, 12:07 PM  Clinical Narrative:    Received call from [place] asking CM to fax discharge summary. Discharge summary printed and faxed. Referred [facility] to CM at [facility]  to reach out to MD for scripts.   Expected Discharge Plan: [place] Barriers to Discharge: No Barriers Identified  Expected Discharge Plan and Services Expected Discharge Plan: [place]   Discharge Planning Services: CM Consult Post Acute Care Choice: [place] Living arrangements for the past 2 months:  (LTC) Expected Discharge Date: 02/27/20                                     Social Determinants of Health (SDOH) Interventions    Readmission Risk Interventions No flowsheet data found.

## 2020-03-02 DIAGNOSIS — R262 Difficulty in walking, not elsewhere classified: Secondary | ICD-10-CM | POA: Diagnosis not present

## 2020-03-02 DIAGNOSIS — S049XXA Injury of unspecified cranial nerve, initial encounter: Secondary | ICD-10-CM | POA: Diagnosis not present

## 2020-03-02 DIAGNOSIS — Z931 Gastrostomy status: Secondary | ICD-10-CM | POA: Diagnosis not present

## 2020-03-02 DIAGNOSIS — S7291XA Unspecified fracture of right femur, initial encounter for closed fracture: Secondary | ICD-10-CM | POA: Diagnosis not present

## 2020-03-11 DIAGNOSIS — Z931 Gastrostomy status: Secondary | ICD-10-CM | POA: Diagnosis not present

## 2020-03-11 DIAGNOSIS — R339 Retention of urine, unspecified: Secondary | ICD-10-CM | POA: Diagnosis not present

## 2020-03-11 DIAGNOSIS — S72001D Fracture of unspecified part of neck of right femur, subsequent encounter for closed fracture with routine healing: Secondary | ICD-10-CM | POA: Diagnosis not present

## 2020-03-11 DIAGNOSIS — G4720 Circadian rhythm sleep disorder, unspecified type: Secondary | ICD-10-CM | POA: Diagnosis not present

## 2020-03-13 DIAGNOSIS — S72001D Fracture of unspecified part of neck of right femur, subsequent encounter for closed fracture with routine healing: Secondary | ICD-10-CM | POA: Diagnosis not present

## 2020-03-16 DIAGNOSIS — R262 Difficulty in walking, not elsewhere classified: Secondary | ICD-10-CM | POA: Diagnosis not present

## 2020-03-16 DIAGNOSIS — S72001D Fracture of unspecified part of neck of right femur, subsequent encounter for closed fracture with routine healing: Secondary | ICD-10-CM | POA: Diagnosis not present

## 2020-03-16 DIAGNOSIS — G4720 Circadian rhythm sleep disorder, unspecified type: Secondary | ICD-10-CM | POA: Diagnosis not present

## 2020-03-16 DIAGNOSIS — K5901 Slow transit constipation: Secondary | ICD-10-CM | POA: Diagnosis not present

## 2020-03-19 DIAGNOSIS — Z20822 Contact with and (suspected) exposure to covid-19: Secondary | ICD-10-CM | POA: Diagnosis not present

## 2020-03-20 DIAGNOSIS — S7291XD Unspecified fracture of right femur, subsequent encounter for closed fracture with routine healing: Secondary | ICD-10-CM | POA: Diagnosis not present

## 2020-03-20 DIAGNOSIS — N390 Urinary tract infection, site not specified: Secondary | ICD-10-CM | POA: Diagnosis not present

## 2020-03-20 DIAGNOSIS — Z950 Presence of cardiac pacemaker: Secondary | ICD-10-CM | POA: Diagnosis not present

## 2020-03-20 DIAGNOSIS — G8929 Other chronic pain: Secondary | ICD-10-CM | POA: Diagnosis not present

## 2020-03-20 DIAGNOSIS — Z9181 History of falling: Secondary | ICD-10-CM | POA: Diagnosis not present

## 2020-03-20 DIAGNOSIS — R1310 Dysphagia, unspecified: Secondary | ICD-10-CM | POA: Diagnosis not present

## 2020-03-20 DIAGNOSIS — Z4732 Aftercare following explantation of hip joint prosthesis: Secondary | ICD-10-CM | POA: Diagnosis not present

## 2020-03-20 DIAGNOSIS — I10 Essential (primary) hypertension: Secondary | ICD-10-CM | POA: Diagnosis not present

## 2020-03-20 DIAGNOSIS — R300 Dysuria: Secondary | ICD-10-CM | POA: Diagnosis not present

## 2020-03-20 DIAGNOSIS — Z451 Encounter for adjustment and management of infusion pump: Secondary | ICD-10-CM | POA: Diagnosis not present

## 2020-03-20 DIAGNOSIS — U071 COVID-19: Secondary | ICD-10-CM | POA: Diagnosis not present

## 2020-03-23 DIAGNOSIS — R1310 Dysphagia, unspecified: Secondary | ICD-10-CM | POA: Diagnosis not present

## 2020-03-23 DIAGNOSIS — R300 Dysuria: Secondary | ICD-10-CM | POA: Diagnosis not present

## 2020-03-23 DIAGNOSIS — U071 COVID-19: Secondary | ICD-10-CM | POA: Diagnosis not present

## 2020-03-23 DIAGNOSIS — G8929 Other chronic pain: Secondary | ICD-10-CM | POA: Diagnosis not present

## 2020-03-23 DIAGNOSIS — I10 Essential (primary) hypertension: Secondary | ICD-10-CM | POA: Diagnosis not present

## 2020-03-27 DIAGNOSIS — N390 Urinary tract infection, site not specified: Secondary | ICD-10-CM | POA: Diagnosis not present

## 2020-03-27 DIAGNOSIS — R300 Dysuria: Secondary | ICD-10-CM | POA: Diagnosis not present

## 2020-03-30 DIAGNOSIS — U071 COVID-19: Secondary | ICD-10-CM | POA: Diagnosis not present

## 2020-03-30 DIAGNOSIS — N390 Urinary tract infection, site not specified: Secondary | ICD-10-CM | POA: Diagnosis not present

## 2020-04-01 DIAGNOSIS — I10 Essential (primary) hypertension: Secondary | ICD-10-CM | POA: Diagnosis not present

## 2020-04-01 DIAGNOSIS — N138 Other obstructive and reflux uropathy: Secondary | ICD-10-CM | POA: Diagnosis not present

## 2020-04-01 DIAGNOSIS — Z9181 History of falling: Secondary | ICD-10-CM | POA: Diagnosis not present

## 2020-04-01 DIAGNOSIS — U071 COVID-19: Secondary | ICD-10-CM | POA: Diagnosis not present

## 2020-04-01 DIAGNOSIS — R1310 Dysphagia, unspecified: Secondary | ICD-10-CM | POA: Diagnosis not present

## 2020-04-01 DIAGNOSIS — R600 Localized edema: Secondary | ICD-10-CM | POA: Diagnosis not present

## 2020-04-01 DIAGNOSIS — Z960 Presence of urogenital implants: Secondary | ICD-10-CM | POA: Diagnosis not present

## 2020-04-01 DIAGNOSIS — R262 Difficulty in walking, not elsewhere classified: Secondary | ICD-10-CM | POA: Diagnosis not present

## 2020-04-01 DIAGNOSIS — Z86011 Personal history of benign neoplasm of the brain: Secondary | ICD-10-CM | POA: Diagnosis not present

## 2020-04-01 DIAGNOSIS — Z431 Encounter for attention to gastrostomy: Secondary | ICD-10-CM | POA: Diagnosis not present

## 2020-04-01 DIAGNOSIS — Z931 Gastrostomy status: Secondary | ICD-10-CM | POA: Diagnosis not present

## 2020-04-01 DIAGNOSIS — Z4732 Aftercare following explantation of hip joint prosthesis: Secondary | ICD-10-CM | POA: Diagnosis not present

## 2020-04-01 DIAGNOSIS — Z96641 Presence of right artificial hip joint: Secondary | ICD-10-CM | POA: Diagnosis not present

## 2020-04-01 DIAGNOSIS — S7291XS Unspecified fracture of right femur, sequela: Secondary | ICD-10-CM | POA: Diagnosis not present

## 2020-04-01 DIAGNOSIS — Z950 Presence of cardiac pacemaker: Secondary | ICD-10-CM | POA: Diagnosis not present

## 2020-04-01 DIAGNOSIS — D333 Benign neoplasm of cranial nerves: Secondary | ICD-10-CM | POA: Diagnosis not present

## 2020-04-06 DIAGNOSIS — U071 COVID-19: Secondary | ICD-10-CM | POA: Diagnosis not present

## 2020-04-06 DIAGNOSIS — N138 Other obstructive and reflux uropathy: Secondary | ICD-10-CM | POA: Diagnosis not present

## 2020-04-06 DIAGNOSIS — R600 Localized edema: Secondary | ICD-10-CM | POA: Diagnosis not present

## 2020-04-06 DIAGNOSIS — R262 Difficulty in walking, not elsewhere classified: Secondary | ICD-10-CM | POA: Diagnosis not present

## 2020-04-08 ENCOUNTER — Other Ambulatory Visit (HOSPITAL_COMMUNITY): Payer: Self-pay | Admitting: Family Medicine

## 2020-04-08 ENCOUNTER — Telehealth: Payer: Self-pay

## 2020-04-08 DIAGNOSIS — Z431 Encounter for attention to gastrostomy: Secondary | ICD-10-CM

## 2020-04-08 NOTE — Telephone Encounter (Signed)
The pt wanted to know if he was okay. He asked when his next transmission was. I told him when his next transmission is due but his monitor was still in the box. The pt states he was going to take it out the box and attempt a transmission. He is going to call back.

## 2020-04-08 NOTE — Telephone Encounter (Signed)
Transmission received.

## 2020-04-09 ENCOUNTER — Other Ambulatory Visit: Payer: Self-pay

## 2020-04-09 ENCOUNTER — Telehealth: Payer: Self-pay

## 2020-04-09 ENCOUNTER — Ambulatory Visit (HOSPITAL_COMMUNITY)
Admission: RE | Admit: 2020-04-09 | Discharge: 2020-04-09 | Disposition: A | Discharge status: Home / Self Care | Payer: Medicare Other | Source: Ambulatory Visit | Attending: Family Medicine | Admitting: Family Medicine

## 2020-04-09 DIAGNOSIS — R1310 Dysphagia, unspecified: Secondary | ICD-10-CM | POA: Diagnosis not present

## 2020-04-09 DIAGNOSIS — Z86011 Personal history of benign neoplasm of the brain: Secondary | ICD-10-CM | POA: Diagnosis not present

## 2020-04-09 DIAGNOSIS — Z431 Encounter for attention to gastrostomy: Secondary | ICD-10-CM | POA: Insufficient documentation

## 2020-04-09 HISTORY — PX: IR GASTROSTOMY TUBE REMOVAL: IMG5492

## 2020-04-09 MED ORDER — LIDOCAINE VISCOUS HCL 2 % MT SOLN
OROMUCOSAL | Status: AC | PRN
  Administered 2020-04-09: 7 mL via ORAL

## 2020-04-09 MED ORDER — LIDOCAINE VISCOUS HCL 2 % MT SOLN
OROMUCOSAL | Status: AC
  Filled 2020-04-09: qty 15

## 2020-04-09 NOTE — Telephone Encounter (Signed)
Following up with patient. Questions answered.

## 2020-04-09 NOTE — Telephone Encounter (Signed)
Transmission reviewed. Normal remote transmission. No episodes noted.  Called patient to discuss. No answer, LMOVM.

## 2020-04-09 NOTE — Telephone Encounter (Signed)
Transmission reviewed 04/09/20. Normal remote. No episodes noted.   Attempted to call patient. No answer, LMOVM.

## 2020-04-09 NOTE — Procedures (Signed)
PROCEDURE SUMMARY:  Successful removal of pull-through gastrostomy tube, removed intact. No immediate complications.  EBL = 0 mL. Pressure dressing (gauze + tegaderm) applied to site. Patient tolerated well.   Wound care instructions: 1- ok to shower 48 hours post-removal. 2- no submerging for 7 days post-removal. 3- ensure area remains clean and dry until fully healed.  Please see imaging section of Epic for full dictation.   Earley Abide PA-C 04/09/2020 12:18 PM

## 2020-04-09 NOTE — Telephone Encounter (Signed)
Patient returning [facility] call for results. Is on the way to [facility] for procedure but is available for a call for the next hour or so.

## 2020-04-10 ENCOUNTER — Other Ambulatory Visit: Payer: Self-pay | Admitting: Neurological Surgery

## 2020-04-10 ENCOUNTER — Other Ambulatory Visit (HOSPITAL_COMMUNITY): Payer: Self-pay | Admitting: Neurological Surgery

## 2020-04-10 DIAGNOSIS — Z931 Gastrostomy status: Secondary | ICD-10-CM | POA: Diagnosis not present

## 2020-04-10 DIAGNOSIS — R1310 Dysphagia, unspecified: Secondary | ICD-10-CM | POA: Diagnosis not present

## 2020-04-10 DIAGNOSIS — D333 Benign neoplasm of cranial nerves: Secondary | ICD-10-CM

## 2020-04-13 DIAGNOSIS — R202 Paresthesia of skin: Secondary | ICD-10-CM | POA: Diagnosis not present

## 2020-04-13 DIAGNOSIS — S049XXA Injury of unspecified cranial nerve, initial encounter: Secondary | ICD-10-CM | POA: Diagnosis not present

## 2020-04-13 DIAGNOSIS — S72001D Fracture of unspecified part of neck of right femur, subsequent encounter for closed fracture with routine healing: Secondary | ICD-10-CM | POA: Diagnosis not present

## 2020-04-13 DIAGNOSIS — R5381 Other malaise: Secondary | ICD-10-CM | POA: Diagnosis not present

## 2020-04-15 DIAGNOSIS — M6281 Muscle weakness (generalized): Secondary | ICD-10-CM | POA: Diagnosis not present

## 2020-04-15 DIAGNOSIS — Z09 Encounter for follow-up examination after completed treatment for conditions other than malignant neoplasm: Secondary | ICD-10-CM | POA: Diagnosis not present

## 2020-04-15 DIAGNOSIS — S7291XS Unspecified fracture of right femur, sequela: Secondary | ICD-10-CM | POA: Diagnosis not present

## 2020-04-15 DIAGNOSIS — H16211 Exposure keratoconjunctivitis, right eye: Secondary | ICD-10-CM | POA: Diagnosis not present

## 2020-04-15 DIAGNOSIS — R1310 Dysphagia, unspecified: Secondary | ICD-10-CM | POA: Diagnosis not present

## 2020-04-15 DIAGNOSIS — S0451XD Injury of facial nerve, right side, subsequent encounter: Secondary | ICD-10-CM | POA: Diagnosis not present

## 2020-04-17 DIAGNOSIS — Z950 Presence of cardiac pacemaker: Secondary | ICD-10-CM | POA: Diagnosis not present

## 2020-04-17 DIAGNOSIS — Z96641 Presence of right artificial hip joint: Secondary | ICD-10-CM | POA: Diagnosis not present

## 2020-04-17 DIAGNOSIS — I442 Atrioventricular block, complete: Secondary | ICD-10-CM | POA: Diagnosis not present

## 2020-04-17 DIAGNOSIS — I10 Essential (primary) hypertension: Secondary | ICD-10-CM | POA: Diagnosis not present

## 2020-04-17 DIAGNOSIS — S7291XS Unspecified fracture of right femur, sequela: Secondary | ICD-10-CM | POA: Diagnosis not present

## 2020-04-17 DIAGNOSIS — Z9181 History of falling: Secondary | ICD-10-CM | POA: Diagnosis not present

## 2020-04-17 DIAGNOSIS — Z960 Presence of urogenital implants: Secondary | ICD-10-CM | POA: Diagnosis not present

## 2020-04-21 DIAGNOSIS — I442 Atrioventricular block, complete: Secondary | ICD-10-CM | POA: Diagnosis not present

## 2020-04-21 DIAGNOSIS — S7291XS Unspecified fracture of right femur, sequela: Secondary | ICD-10-CM | POA: Diagnosis not present

## 2020-04-21 DIAGNOSIS — Z960 Presence of urogenital implants: Secondary | ICD-10-CM | POA: Diagnosis not present

## 2020-04-21 DIAGNOSIS — I10 Essential (primary) hypertension: Secondary | ICD-10-CM | POA: Diagnosis not present

## 2020-04-21 DIAGNOSIS — Z96641 Presence of right artificial hip joint: Secondary | ICD-10-CM | POA: Diagnosis not present

## 2020-04-21 DIAGNOSIS — Z950 Presence of cardiac pacemaker: Secondary | ICD-10-CM | POA: Diagnosis not present

## 2020-04-21 DIAGNOSIS — Z9181 History of falling: Secondary | ICD-10-CM | POA: Diagnosis not present

## 2020-04-23 DIAGNOSIS — I442 Atrioventricular block, complete: Secondary | ICD-10-CM | POA: Diagnosis not present

## 2020-04-23 DIAGNOSIS — Z96641 Presence of right artificial hip joint: Secondary | ICD-10-CM | POA: Diagnosis not present

## 2020-04-23 DIAGNOSIS — Z950 Presence of cardiac pacemaker: Secondary | ICD-10-CM | POA: Diagnosis not present

## 2020-04-23 DIAGNOSIS — S7291XS Unspecified fracture of right femur, sequela: Secondary | ICD-10-CM | POA: Diagnosis not present

## 2020-04-23 DIAGNOSIS — Z960 Presence of urogenital implants: Secondary | ICD-10-CM | POA: Diagnosis not present

## 2020-04-23 DIAGNOSIS — I10 Essential (primary) hypertension: Secondary | ICD-10-CM | POA: Diagnosis not present

## 2020-04-23 DIAGNOSIS — Z9181 History of falling: Secondary | ICD-10-CM | POA: Diagnosis not present

## 2020-04-24 DIAGNOSIS — I442 Atrioventricular block, complete: Secondary | ICD-10-CM | POA: Diagnosis not present

## 2020-04-24 DIAGNOSIS — I10 Essential (primary) hypertension: Secondary | ICD-10-CM | POA: Diagnosis not present

## 2020-04-24 DIAGNOSIS — Z96641 Presence of right artificial hip joint: Secondary | ICD-10-CM | POA: Diagnosis not present

## 2020-04-24 DIAGNOSIS — Z960 Presence of urogenital implants: Secondary | ICD-10-CM | POA: Diagnosis not present

## 2020-04-24 DIAGNOSIS — Z9181 History of falling: Secondary | ICD-10-CM | POA: Diagnosis not present

## 2020-04-24 DIAGNOSIS — Z950 Presence of cardiac pacemaker: Secondary | ICD-10-CM | POA: Diagnosis not present

## 2020-04-24 DIAGNOSIS — S7291XS Unspecified fracture of right femur, sequela: Secondary | ICD-10-CM | POA: Diagnosis not present

## 2020-04-28 DIAGNOSIS — S7291XS Unspecified fracture of right femur, sequela: Secondary | ICD-10-CM | POA: Diagnosis not present

## 2020-04-28 DIAGNOSIS — Z9181 History of falling: Secondary | ICD-10-CM | POA: Diagnosis not present

## 2020-04-28 DIAGNOSIS — I442 Atrioventricular block, complete: Secondary | ICD-10-CM | POA: Diagnosis not present

## 2020-04-28 DIAGNOSIS — I10 Essential (primary) hypertension: Secondary | ICD-10-CM | POA: Diagnosis not present

## 2020-04-28 DIAGNOSIS — Z950 Presence of cardiac pacemaker: Secondary | ICD-10-CM | POA: Diagnosis not present

## 2020-04-28 DIAGNOSIS — Z96641 Presence of right artificial hip joint: Secondary | ICD-10-CM | POA: Diagnosis not present

## 2020-04-28 DIAGNOSIS — Z960 Presence of urogenital implants: Secondary | ICD-10-CM | POA: Diagnosis not present

## 2020-04-29 ENCOUNTER — Encounter (HOSPITAL_COMMUNITY): Payer: Self-pay

## 2020-04-29 ENCOUNTER — Ambulatory Visit (HOSPITAL_COMMUNITY): Admission: RE | Admit: 2020-04-29 | Payer: Medicare Other | Source: Ambulatory Visit

## 2020-04-30 DIAGNOSIS — Z9181 History of falling: Secondary | ICD-10-CM | POA: Diagnosis not present

## 2020-04-30 DIAGNOSIS — Z950 Presence of cardiac pacemaker: Secondary | ICD-10-CM | POA: Diagnosis not present

## 2020-04-30 DIAGNOSIS — S7291XS Unspecified fracture of right femur, sequela: Secondary | ICD-10-CM | POA: Diagnosis not present

## 2020-04-30 DIAGNOSIS — Z960 Presence of urogenital implants: Secondary | ICD-10-CM | POA: Diagnosis not present

## 2020-04-30 DIAGNOSIS — Z96641 Presence of right artificial hip joint: Secondary | ICD-10-CM | POA: Diagnosis not present

## 2020-04-30 DIAGNOSIS — I442 Atrioventricular block, complete: Secondary | ICD-10-CM | POA: Diagnosis not present

## 2020-04-30 DIAGNOSIS — I10 Essential (primary) hypertension: Secondary | ICD-10-CM | POA: Diagnosis not present

## 2020-05-01 DIAGNOSIS — S7291XS Unspecified fracture of right femur, sequela: Secondary | ICD-10-CM | POA: Diagnosis not present

## 2020-05-01 DIAGNOSIS — Z960 Presence of urogenital implants: Secondary | ICD-10-CM | POA: Diagnosis not present

## 2020-05-01 DIAGNOSIS — Z950 Presence of cardiac pacemaker: Secondary | ICD-10-CM | POA: Diagnosis not present

## 2020-05-01 DIAGNOSIS — I442 Atrioventricular block, complete: Secondary | ICD-10-CM | POA: Diagnosis not present

## 2020-05-01 DIAGNOSIS — I10 Essential (primary) hypertension: Secondary | ICD-10-CM | POA: Diagnosis not present

## 2020-05-01 DIAGNOSIS — Z9181 History of falling: Secondary | ICD-10-CM | POA: Diagnosis not present

## 2020-05-01 DIAGNOSIS — Z96641 Presence of right artificial hip joint: Secondary | ICD-10-CM | POA: Diagnosis not present

## 2020-05-04 DIAGNOSIS — Z950 Presence of cardiac pacemaker: Secondary | ICD-10-CM | POA: Diagnosis not present

## 2020-05-04 DIAGNOSIS — I10 Essential (primary) hypertension: Secondary | ICD-10-CM | POA: Diagnosis not present

## 2020-05-04 DIAGNOSIS — Z960 Presence of urogenital implants: Secondary | ICD-10-CM | POA: Diagnosis not present

## 2020-05-04 DIAGNOSIS — S7291XS Unspecified fracture of right femur, sequela: Secondary | ICD-10-CM | POA: Diagnosis not present

## 2020-05-04 DIAGNOSIS — Z9181 History of falling: Secondary | ICD-10-CM | POA: Diagnosis not present

## 2020-05-04 DIAGNOSIS — Z96641 Presence of right artificial hip joint: Secondary | ICD-10-CM | POA: Diagnosis not present

## 2020-05-04 DIAGNOSIS — I442 Atrioventricular block, complete: Secondary | ICD-10-CM | POA: Diagnosis not present

## 2020-05-06 DIAGNOSIS — I10 Essential (primary) hypertension: Secondary | ICD-10-CM | POA: Diagnosis not present

## 2020-05-06 DIAGNOSIS — Z9181 History of falling: Secondary | ICD-10-CM | POA: Diagnosis not present

## 2020-05-06 DIAGNOSIS — Z96641 Presence of right artificial hip joint: Secondary | ICD-10-CM | POA: Diagnosis not present

## 2020-05-06 DIAGNOSIS — Z960 Presence of urogenital implants: Secondary | ICD-10-CM | POA: Diagnosis not present

## 2020-05-06 DIAGNOSIS — I442 Atrioventricular block, complete: Secondary | ICD-10-CM | POA: Diagnosis not present

## 2020-05-06 DIAGNOSIS — S7291XS Unspecified fracture of right femur, sequela: Secondary | ICD-10-CM | POA: Diagnosis not present

## 2020-05-06 DIAGNOSIS — Z950 Presence of cardiac pacemaker: Secondary | ICD-10-CM | POA: Diagnosis not present

## 2020-05-11 DIAGNOSIS — Z950 Presence of cardiac pacemaker: Secondary | ICD-10-CM | POA: Diagnosis not present

## 2020-05-11 DIAGNOSIS — Z960 Presence of urogenital implants: Secondary | ICD-10-CM | POA: Diagnosis not present

## 2020-05-11 DIAGNOSIS — Z96641 Presence of right artificial hip joint: Secondary | ICD-10-CM | POA: Diagnosis not present

## 2020-05-11 DIAGNOSIS — Z9181 History of falling: Secondary | ICD-10-CM | POA: Diagnosis not present

## 2020-05-11 DIAGNOSIS — S7291XS Unspecified fracture of right femur, sequela: Secondary | ICD-10-CM | POA: Diagnosis not present

## 2020-05-11 DIAGNOSIS — I442 Atrioventricular block, complete: Secondary | ICD-10-CM | POA: Diagnosis not present

## 2020-05-11 DIAGNOSIS — I10 Essential (primary) hypertension: Secondary | ICD-10-CM | POA: Diagnosis not present

## 2020-05-12 DIAGNOSIS — Z960 Presence of urogenital implants: Secondary | ICD-10-CM | POA: Diagnosis not present

## 2020-05-12 DIAGNOSIS — Z96641 Presence of right artificial hip joint: Secondary | ICD-10-CM | POA: Diagnosis not present

## 2020-05-12 DIAGNOSIS — S7291XS Unspecified fracture of right femur, sequela: Secondary | ICD-10-CM | POA: Diagnosis not present

## 2020-05-12 DIAGNOSIS — Z950 Presence of cardiac pacemaker: Secondary | ICD-10-CM | POA: Diagnosis not present

## 2020-05-12 DIAGNOSIS — Z9181 History of falling: Secondary | ICD-10-CM | POA: Diagnosis not present

## 2020-05-12 DIAGNOSIS — I10 Essential (primary) hypertension: Secondary | ICD-10-CM | POA: Diagnosis not present

## 2020-05-12 DIAGNOSIS — I442 Atrioventricular block, complete: Secondary | ICD-10-CM | POA: Diagnosis not present

## 2020-05-19 DIAGNOSIS — S7291XS Unspecified fracture of right femur, sequela: Secondary | ICD-10-CM | POA: Diagnosis not present

## 2020-05-19 DIAGNOSIS — Z960 Presence of urogenital implants: Secondary | ICD-10-CM | POA: Diagnosis not present

## 2020-05-19 DIAGNOSIS — Z950 Presence of cardiac pacemaker: Secondary | ICD-10-CM | POA: Diagnosis not present

## 2020-05-19 DIAGNOSIS — I442 Atrioventricular block, complete: Secondary | ICD-10-CM | POA: Diagnosis not present

## 2020-05-19 DIAGNOSIS — Z96641 Presence of right artificial hip joint: Secondary | ICD-10-CM | POA: Diagnosis not present

## 2020-05-19 DIAGNOSIS — I10 Essential (primary) hypertension: Secondary | ICD-10-CM | POA: Diagnosis not present

## 2020-05-19 DIAGNOSIS — Z9181 History of falling: Secondary | ICD-10-CM | POA: Diagnosis not present

## 2020-05-20 DIAGNOSIS — M6281 Muscle weakness (generalized): Secondary | ICD-10-CM | POA: Diagnosis not present

## 2020-05-20 DIAGNOSIS — R1310 Dysphagia, unspecified: Secondary | ICD-10-CM | POA: Diagnosis not present

## 2020-05-20 DIAGNOSIS — S7291XS Unspecified fracture of right femur, sequela: Secondary | ICD-10-CM | POA: Diagnosis not present

## 2020-05-21 DIAGNOSIS — Z9181 History of falling: Secondary | ICD-10-CM | POA: Diagnosis not present

## 2020-05-21 DIAGNOSIS — Z960 Presence of urogenital implants: Secondary | ICD-10-CM | POA: Diagnosis not present

## 2020-05-21 DIAGNOSIS — I10 Essential (primary) hypertension: Secondary | ICD-10-CM | POA: Diagnosis not present

## 2020-05-21 DIAGNOSIS — Z96641 Presence of right artificial hip joint: Secondary | ICD-10-CM | POA: Diagnosis not present

## 2020-05-21 DIAGNOSIS — S7291XS Unspecified fracture of right femur, sequela: Secondary | ICD-10-CM | POA: Diagnosis not present

## 2020-05-21 DIAGNOSIS — I442 Atrioventricular block, complete: Secondary | ICD-10-CM | POA: Diagnosis not present

## 2020-05-21 DIAGNOSIS — Z950 Presence of cardiac pacemaker: Secondary | ICD-10-CM | POA: Diagnosis not present

## 2020-05-25 ENCOUNTER — Telehealth: Payer: Self-pay

## 2020-05-25 ENCOUNTER — Encounter: Payer: Medicare Other | Admitting: Cardiovascular Disease

## 2020-05-25 NOTE — Telephone Encounter (Signed)
Message will be sent to scheduling.

## 2020-05-25 NOTE — Telephone Encounter (Signed)
Patient left a voicemail that he cannot make his 8am appointment. He states his transportation can bring him to his appointments in the afternoons and make sure his appointment is scheduled in the afternoon not early morning. He would like a call back at [phone].

## 2020-05-26 ENCOUNTER — Ambulatory Visit (INDEPENDENT_AMBULATORY_CARE_PROVIDER_SITE_OTHER): Payer: Medicare Other

## 2020-05-26 DIAGNOSIS — Z960 Presence of urogenital implants: Secondary | ICD-10-CM | POA: Diagnosis not present

## 2020-05-26 DIAGNOSIS — S7291XS Unspecified fracture of right femur, sequela: Secondary | ICD-10-CM | POA: Diagnosis not present

## 2020-05-26 DIAGNOSIS — I442 Atrioventricular block, complete: Secondary | ICD-10-CM

## 2020-05-26 DIAGNOSIS — Z9181 History of falling: Secondary | ICD-10-CM | POA: Diagnosis not present

## 2020-05-26 DIAGNOSIS — Z950 Presence of cardiac pacemaker: Secondary | ICD-10-CM | POA: Diagnosis not present

## 2020-05-26 DIAGNOSIS — I10 Essential (primary) hypertension: Secondary | ICD-10-CM | POA: Diagnosis not present

## 2020-05-26 DIAGNOSIS — Z96641 Presence of right artificial hip joint: Secondary | ICD-10-CM | POA: Diagnosis not present

## 2020-05-27 LAB — CUP PACEART REMOTE DEVICE CHECK
Battery Remaining Longevity: 147 mo
Battery Voltage: 3.19 V
Brady Statistic AP VP Percent: 11.3 %
Brady Statistic AP VS Percent: 0 %
Brady Statistic AS VP Percent: 88.48 %
Brady Statistic AS VS Percent: 0.22 %
Brady Statistic RA Percent Paced: 11.37 %
Brady Statistic RV Percent Paced: 99.78 %
Date Time Interrogation Session: 20211206233707
Implantable Lead Implant Date: 20130125
Implantable Lead Implant Date: 20130125
Implantable Lead Location: 753859
Implantable Lead Location: 753860
Implantable Lead Model: 5076
Implantable Pulse Generator Implant Date: 20210902
Lead Channel Impedance Value: 342 Ohm
Lead Channel Impedance Value: 437 Ohm
Lead Channel Impedance Value: 456 Ohm
Lead Channel Impedance Value: 513 Ohm
Lead Channel Pacing Threshold Amplitude: 0.625 V
Lead Channel Pacing Threshold Amplitude: 0.625 V
Lead Channel Pacing Threshold Pulse Width: 0.4 ms
Lead Channel Pacing Threshold Pulse Width: 0.4 ms
Lead Channel Sensing Intrinsic Amplitude: 16.125 mV
Lead Channel Sensing Intrinsic Amplitude: 16.125 mV
Lead Channel Sensing Intrinsic Amplitude: 4.125 mV
Lead Channel Sensing Intrinsic Amplitude: 4.125 mV
Lead Channel Setting Pacing Amplitude: 1.5 V
Lead Channel Setting Pacing Amplitude: 2 V
Lead Channel Setting Pacing Pulse Width: 0.4 ms
Lead Channel Setting Sensing Sensitivity: 0.9 mV

## 2020-06-02 DIAGNOSIS — S7291XS Unspecified fracture of right femur, sequela: Secondary | ICD-10-CM | POA: Diagnosis not present

## 2020-06-02 DIAGNOSIS — I10 Essential (primary) hypertension: Secondary | ICD-10-CM | POA: Diagnosis not present

## 2020-06-02 DIAGNOSIS — Z950 Presence of cardiac pacemaker: Secondary | ICD-10-CM | POA: Diagnosis not present

## 2020-06-02 DIAGNOSIS — I255 Ischemic cardiomyopathy: Secondary | ICD-10-CM | POA: Diagnosis not present

## 2020-06-02 DIAGNOSIS — I442 Atrioventricular block, complete: Secondary | ICD-10-CM | POA: Diagnosis not present

## 2020-06-02 DIAGNOSIS — R7303 Prediabetes: Secondary | ICD-10-CM | POA: Diagnosis not present

## 2020-06-02 DIAGNOSIS — R600 Localized edema: Secondary | ICD-10-CM | POA: Diagnosis not present

## 2020-06-02 DIAGNOSIS — Z960 Presence of urogenital implants: Secondary | ICD-10-CM | POA: Diagnosis not present

## 2020-06-02 DIAGNOSIS — Z9181 History of falling: Secondary | ICD-10-CM | POA: Diagnosis not present

## 2020-06-02 DIAGNOSIS — E559 Vitamin D deficiency, unspecified: Secondary | ICD-10-CM | POA: Diagnosis not present

## 2020-06-02 DIAGNOSIS — Z96641 Presence of right artificial hip joint: Secondary | ICD-10-CM | POA: Diagnosis not present

## 2020-06-04 DIAGNOSIS — R600 Localized edema: Secondary | ICD-10-CM | POA: Diagnosis not present

## 2020-06-04 NOTE — Progress Notes (Signed)
Remote pacemaker transmission.   

## 2020-06-17 ENCOUNTER — Other Ambulatory Visit: Payer: Self-pay

## 2020-06-17 ENCOUNTER — Ambulatory Visit (INDEPENDENT_AMBULATORY_CARE_PROVIDER_SITE_OTHER): Payer: Medicare Other | Admitting: Cardiovascular Disease

## 2020-06-17 ENCOUNTER — Encounter: Payer: Self-pay | Admitting: Cardiovascular Disease

## 2020-06-17 VITALS — BP 112/66 | HR 76 | Ht 70.0 in | Wt 173.0 lb

## 2020-06-17 DIAGNOSIS — I472 Ventricular tachycardia: Secondary | ICD-10-CM | POA: Diagnosis not present

## 2020-06-17 DIAGNOSIS — I471 Supraventricular tachycardia: Secondary | ICD-10-CM

## 2020-06-17 DIAGNOSIS — I4729 Other ventricular tachycardia: Secondary | ICD-10-CM

## 2020-06-17 DIAGNOSIS — Z950 Presence of cardiac pacemaker: Secondary | ICD-10-CM | POA: Diagnosis not present

## 2020-06-17 DIAGNOSIS — I442 Atrioventricular block, complete: Secondary | ICD-10-CM | POA: Diagnosis not present

## 2020-06-17 NOTE — Progress Notes (Signed)
Patient ID: Casey Richard, male   DOB: [birth year], 64 y.o.   MRN: [id number]    Cardiology Office Note    Date:  06/17/2020   ID:  Casey Richard, DOB [birth year], MRN [id number]  PCP:  Jackie Plumsei-Bonsu, George, MD  Cardiologist:   Thurmon FairMihai Daliana Leverett, MD   Chief Complaint  Patient presents with  . Pacemaker Check    History of Present Illness:  Casey Richard is a 64 y.o. male who presents for follow-up for complete heart block and pacemaker check.  This is his first visit after he underwent pacemaker generator change out in September 2021 (original device and leads implanted 2013 when he presented with complete heart block).  [facility]  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  He had to have repeat craniotomy for bleeding a few days after the original procedure.  He had extensive deficits including right hemiparesis, dysphagia and required tracheostomy, feeding tube implantation and urinary indwelling catheter.  He spent about 6 months in rehab.  He made slow but steady progress and all artificial ostomies have been closed.  Unfortunately, in late August he fell and fractured his right hip.  He underwent surgical repair and had a pacemaker generator change out (part ERI) on the same admission.  He remains a remarkably positive attitude.  He's worked hard with physical and speech therapy.  He is able to eat and swallow as long as he does it deliberately and with great caution.  His speech is now very intelligible.  He is now able to walk with a cane, although he occasionally will still use a four-point walker.  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  He denies angina or dyspnea at rest or with activity and has never complained of palpitations.  He does not have dizziness and has  not experienced syncope.  He was having some problems with lower extremity edema and was prescribed hydrochlorothiazide.  He does have varicose veins.  The edema is essentially resolved at this time.  His blood pressure is quite low.  His current device is a Medtronic Azure dual-chamber device, still using the original Medtronic 5086 leads implanted in 2013.  At this point estimated generator longevity is 12.1 years.  All lead parameters look excellent and auto capture is turned on.  The device has recorded one very brief episode of nonsustained VT consisting of 8 beats, more than a month ago.  He has only 19% atrial pacing and 99.8% ventricular pacing.  He is very much pacemaker dependent without any trace of escape rhythm.  Most interestingly, his pacemaker shows a remarkable improvement in activity level from less than 30 minutes/day in early October to 2 hours a day November and now up to 4 hours a day in late December.  He presented with cardiac arrest and a prolonged intensive care unit stay in 2013. He has a dual-chamber Medtronic Revo MRI conditional device. Following his cardiac arrest he has had issues with short-term memory loss.  He had significant urethral injury at the time of his cardiac arrest.  He was hospitalized with urinary retention and the 1000 mL distended bladder in early 2021.  He underwent craniotomy for a very large right vestibular cystic schwannoma in April 2021 and had to have repeat surgery for bleeding with  a very prolonged rehabilitation.  Has extensive right facial palsy.  Past Medical History:  Diagnosis Date  . Cardiac arrest ([place])   . CHB (complete heart block) ([place])   . 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 Dysrhythmia 1/13   bradycardia, VF, cardiac arrest/complete heart block with pacer inserted/LOV Dr Sallyanne Kuster 07/21/11 with interrogation and anesthesia guideline order on chart. Chest x ray 1/13 EPIC,, TEE and [facility]  1/13  EPIC  . Memory loss, short term    from fall with heart block 1/13  . Nonsustained ventricular tachycardia ([place]) 02/07/2014  . Presence of permanent cardiac pacemaker 07/15/2011   Medtronic Revo  . Schwannoma   . Ureteral stricture    Indwelling Foley    Past Surgical History:  Procedure Laterality Date  . CARDIAC CATHETERIZATION  07/09/2011   Normal coronaries  . CRANIOTOMY Right 10/07/2019   Procedure: RIGHT CRANIOTOMY TUMOR EXCISION;  Surgeon: Judith Part, MD;  Location: [place];  Service: Neurosurgery;  Laterality: Right;  . CRANIOTOMY Right 10/10/2019   Procedure: CRANIOTOMY FOR EVACUATION OF HEMATOMA;  Surgeon: Judith Part, MD;  Location: [place];  Service: Neurosurgery;  Laterality: Right;  CRANIOTOMY TUMOR EXCISION  . CYSTOSCOPY WITH URETHRAL DILATATION  08/19/2011   Procedure: CYSTOSCOPY WITH URETHRAL DILATATION;  Surgeon: Dutch Gray, MD;  Location: [facility];  Service: Urology;  Laterality: N/A;  Balloon Dilation of Urethral Stricture, retro-urethrogram, Suprapubic change   . ESOPHAGOGASTRODUODENOSCOPY N/A 10/18/2019   Procedure: ESOPHAGOGASTRODUODENOSCOPY (EGD);  Surgeon: Jesusita Oka, MD;  Location: [facility] ENDOSCOPY;  Service: Endoscopy;  Laterality: N/A;  . HIP ARTHROPLASTY Right 02/18/2020   Procedure: ARTHROPLASTY BIPOLAR HIP (HEMIARTHROPLASTY);  Surgeon: Marchia Bond, MD;  Location: [facility];  Service: Orthopedics;  Laterality: Right;  . IR GASTROSTOMY TUBE REMOVAL  04/09/2020  . LEFT HEART CATHETERIZATION WITH CORONARY ANGIOGRAM N/A 07/09/2011   Procedure: LEFT HEART CATHETERIZATION WITH CORONARY ANGIOGRAM;  Surgeon: Jettie Booze, MD;  Location: [facility] CATH LAB;  Service: Cardiovascular;  Laterality: N/A;  . PACEMAKER INSERTION  07/15/11   Medtronic Revo  . PEG PLACEMENT N/A 10/18/2019   Procedure: PERCUTANEOUS ENDOSCOPIC GASTROSTOMY (PEG) PLACEMENT;  Surgeon: Jesusita Oka, MD;  Location: [place] ENDOSCOPY;  Service: Endoscopy;  Laterality: N/A;  . PERMANENT PACEMAKER  INSERTION Left 07/15/2011   Procedure: PERMANENT PACEMAKER INSERTION;  Surgeon: Sanda Klein, MD;  Location: [place] CATH LAB;  Service: Cardiovascular;  Laterality: Left;  . PPM GENERATOR CHANGEOUT N/A 02/20/2020   Procedure: PPM GENERATOR CHANGEOUT;  Surgeon: Sanda Klein, MD;  Location: [place] CV LAB;  Service: Cardiovascular;  Laterality: N/A;  . TEMPORARY PACEMAKER INSERTION Right 07/09/2011   Procedure: TEMPORARY PACEMAKER INSERTION;  Surgeon: Jettie Booze, MD;  Location[facility] C[facility] LAB;  Service: Cardiovascular;  Laterality: Right;    Current Medications: Outpatient Medications Prior to Visit  Medication Sig Dispense Refill  . artificial tears (LACRILUBE) OINT ophthalmic ointment Place into the right eye every 4 (four) hours.    . Cholecalciferol 100 MCG (4000 UT) CAPS Take 1 capsule by mouth daily.    Marland Kitchen gabapentin (NEURONTIN) 100 MG capsule TAKE 1 CAPSULE BY MOUTH ONCE AT NIGHT    . tamsulosin (FLOMAX) 0.4 MG CAPS capsule Take 0.4 mg by mouth at bedtime.    . vitamin B-12 (CYANOCOBALAMIN) 1000 MCG tablet Take 2,000 mcg by mouth daily.    Marland Kitchen acetaminophen (TYLENOL) 325 MG tablet Take 1-2 tablets (325-650 mg total) by mouth every 4 (four) hours  as needed for mild pain. 30 tablet 0  . Amino Acids-Protein Hydrolys (FEEDING SUPPLEMENT, PRO-STAT SUGAR FREE 64,) LIQD Place 30 mLs into feeding tube 2 (two) times daily. 887 mL 0  . docusate (COLACE) 50 MG/5ML liquid Place 10 mLs (100 mg total) into feeding tube daily. 100 mL 0  . hydrochlorothiazide (HYDRODIURIL) 25 MG tablet Take 25 mg by mouth daily.    Marland Kitchen HYDROcodone-acetaminophen (NORCO) 7.5-325 MG tablet Take 1-2 tablets by mouth every 4 (four) hours as needed for moderate pain or severe pain. 30 tablet 0  . Nutritional Supplements (ISOSOURCE 1.5 CAL) LIQD 1,000 mLs by PEG Tube route daily.  0  . pantoprazole sodium (PROTONIX) 40 mg/20 mL PACK Place 20 mLs (40 mg total) into feeding tube daily. 30 mL   . polyethylene glycol (MIRALAX /  GLYCOLAX) 17 g packet Take 17 g by mouth daily. 14 each 0  . QUEtiapine (SEROQUEL) 25 MG tablet Place 1 tablet (25 mg total) into feeding tube at bedtime.    . enoxaparin (LOVENOX) 40 MG/0.4ML injection Inject 0.4 mLs (40 mg total) into the skin daily for 21 days. 0.4 mL 0   No facility-administered medications prior to visit.     Allergies:   Patient has no known allergies.   Social History   Socioeconomic History  . Marital status: Widowed    Spouse name: Not on file  . Number of children: 4  . Years of education: Not on file  . Highest education level: Not on file  Occupational History  . Not on file  Tobacco Use  . Smoking status: Former Smoker    Types: Cigarettes    Quit date: 06/20/1989    Years since quitting: 31.0  . Smokeless tobacco: Never Used  Vaping Use  . Vaping Use: Never used  Substance and Sexual Activity  . Alcohol use: No  . Drug use: No  . Sexual activity: Not on file  Other Topics Concern  . Not on file  Social History Narrative  . Not on file   Social Determinants of Health   Financial Resource Strain: Not on file  Food Insecurity: Not on file  Transportation Needs: Not on file  Physical Activity: Not on file  Stress: Not on file  Social Connections: Not on file     Family History:  The patient's family history includes COPD in his father; Coronary artery disease in his brother and mother.   ROS:   Please see the history of present illness.    All other systems are reviewed and are negative.   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  The right subclavian pacemaker site appears healthy. Head: no evidence of trauma, PERRL, EOMI, no exophtalmos or lid lag, no myxedema, no xanthelasma; normal ears, nose and oropharynx Neck: normal jugular venous  pulsations and no hepatojugular reflux; brisk carotid pulses without delay and no carotid bruits Chest: clear to auscultation, no signs of consolidation by percussion or palpation, normal fremitus, symmetrical and full respiratory excursions Cardiovascular: normal position and quality of the apical impulse, regular rhythm, normal first and paradoxically split second heart sounds, no murmurs, rubs or gallops Abdomen: no tenderness or distention, no masses by palpation, no abnormal  pulsatility or arterial bruits, normal bowel sounds, no hepatosplenomegaly Extremities: no clubbing, cyanosis or edema; 2+ radial, ulnar and brachial pulses bilaterally; 2+ right femoral, posterior tibial and dorsalis pedis pulses; 2+ left femoral, posterior tibial and dorsalis pedis pulses; no subclavian or femoral bruits Neurological: Marked right facial palsy, slow careful gait with small steps using a cane. Psych: Normal mood and affect    Wt Readings from Last 3 Encounters:  06/17/20 173 lb (78.5 kg)  02/27/20 157 lb 13.6 oz (71.6 kg)  02/14/20 159 lb 3.2 oz (72.2 kg)   Studies/Labs Reviewed:   EKG:  EKG is not ordered today.  The intracardiac electrogram shows atrial sensed, ventricular paced rhythm.  ASSESSMENT:    1. CHB (complete heart block) (HCC)   2. PAT (paroxysmal atrial tachycardia) (HCC)   3. NSVT (nonsustained ventricular tachycardia) (HCC)   4. Pacemaker     PLAN:  In order of problems listed above:  1. CHB: Pacemaker dependent.  There is no escape rhythm. 2. PAT: No meaningful events since generator change out in September. 3. NSVT: Infrequently seen on his previous pacemaker, has had one 8 beat run of NSVT since device implantation about 4 months ago.  Specific therapy is not indicated. 4. PPM: Recent pacemaker generator change out.  Normal device function.  Enrolled in device clinic with downloads every 3 months. 5. Urinary retention/indwelling catheter: Has an upcoming appointment with  urology. 6. 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   Medication Adjustments/Labs and Tests Ordered: Current medicines are reviewed at length with the patient today.  Concerns regarding medicines are outlined above.  Medication changes, Labs and Tests ordered today are listed in the Patient Instructions below. Patient Instructions  Medication Instructions:  STOP the Hydrochlorothiazide *If you need a refill on your cardiac medications before your next appointment, please call your pharmacy*   Lab Work: None ordered If you have labs (blood work) drawn today and your tests are completely normal, you will receive your results only by: Marland Kitchen MyChart Message (if you have MyChart) OR . A paper copy in the mail If you have any lab test that is abnormal or we need to change your treatment, we will call you to review the results.   Testing/Procedures: None ordered   Follow-Up: At [facility], you and your health needs are our priority.  As part of our continuing mission to provide you with exceptional heart care, we have created designated Provider Care Teams.  These Care Teams include your primary Cardiologist (physician) and Advanced Practice Providers (APPs -  Physician Assistants and Nurse Practitioners) who all work together to provide you with the care you need, when you need it.  We recommend signing up for the patient portal called "MyChart".  Sign up information is provided on this After Visit Summary.  MyChart is used to connect with patients for Virtual Visits (Telemedicine).  Patients are able to view lab/test results, encounter notes, upcoming appointments, etc.  Non-urgent messages can be sent to your provider as well.   To learn more about what you can do with MyChart, go to [email or web address].    Your next appointment:   12 month(s)  The format for your next appointment:   In Person  Provider:   Thurmon Fair, MD       Signed, Thurmon Fair, MD  06/17/2020 9:18 PM    [facility] [address] [place], [place], [place]  [address] Phone: [phone]; Fax: [phone]    

## 2020-06-17 NOTE — Patient Instructions (Signed)
Medication Instructions:  STOP the Hydrochlorothiazide *If you need a refill on your cardiac medications before your next appointment, please call your pharmacy*   Lab Work: None ordered If you have labs (blood work) drawn today and your tests are completely normal, you will receive your results only by: Marland Kitchen MyChart Message (if you have MyChart) OR . A paper copy in the mail If you have any lab test that is abnormal or we need to change your treatment, we will call you to review the results.   Testing/Procedures: None ordered   Follow-Up: At [facility], you and your health needs are our priority.  As part of our continuing mission to provide you with exceptional heart care, we have created designated Provider Care Teams.  These Care Teams include your primary Cardiologist (physician) and Advanced Practice Providers (APPs -  Physician Assistants and Nurse Practitioners) who all work together to provide you with the care you need, when you need it.  We recommend signing up for the patient portal called "MyChart".  Sign up information is provided on this After Visit Summary.  MyChart is used to connect with patients for Virtual Visits (Telemedicine).  Patients are able to view lab/test results, encounter notes, upcoming appointments, etc.  Non-urgent messages can be sent to your provider as well.   To learn more about what you can do with MyChart, go to [email or web address].    Your next appointment:   12 month(s)  The format for your next appointment:   In Person  Provider:   Thurmon Fair, MD

## 2020-07-16 DIAGNOSIS — M6281 Muscle weakness (generalized): Secondary | ICD-10-CM | POA: Diagnosis not present

## 2020-07-16 DIAGNOSIS — S7291XS Unspecified fracture of right femur, sequela: Secondary | ICD-10-CM | POA: Diagnosis not present

## 2020-07-16 DIAGNOSIS — R1310 Dysphagia, unspecified: Secondary | ICD-10-CM | POA: Diagnosis not present

## 2020-07-16 DIAGNOSIS — U071 COVID-19: Secondary | ICD-10-CM | POA: Diagnosis not present

## 2020-08-16 DIAGNOSIS — S7291XS Unspecified fracture of right femur, sequela: Secondary | ICD-10-CM | POA: Diagnosis not present

## 2020-08-16 DIAGNOSIS — U071 COVID-19: Secondary | ICD-10-CM | POA: Diagnosis not present

## 2020-08-16 DIAGNOSIS — R1310 Dysphagia, unspecified: Secondary | ICD-10-CM | POA: Diagnosis not present

## 2020-08-16 DIAGNOSIS — M6281 Muscle weakness (generalized): Secondary | ICD-10-CM | POA: Diagnosis not present

## 2020-08-25 ENCOUNTER — Ambulatory Visit (INDEPENDENT_AMBULATORY_CARE_PROVIDER_SITE_OTHER): Payer: Medicare Other

## 2020-08-25 DIAGNOSIS — I442 Atrioventricular block, complete: Secondary | ICD-10-CM

## 2020-08-25 LAB — CUP PACEART REMOTE DEVICE CHECK
Battery Remaining Longevity: 141 mo
Battery Voltage: 3.15 V
Brady Statistic AP VP Percent: 49.01 %
Brady Statistic AP VS Percent: 0 %
Brady Statistic AS VP Percent: 50.68 %
Brady Statistic AS VS Percent: 0.31 %
Brady Statistic RA Percent Paced: 49.18 %
Brady Statistic RV Percent Paced: 99.69 %
Date Time Interrogation Session: 20220308021917
Implantable Lead Implant Date: 20130125
Implantable Lead Implant Date: 20130125
Implantable Lead Location: 753859
Implantable Lead Location: 753860
Implantable Lead Model: 5076
Implantable Pulse Generator Implant Date: 20210902
Lead Channel Impedance Value: 342 Ohm
Lead Channel Impedance Value: 399 Ohm
Lead Channel Impedance Value: 456 Ohm
Lead Channel Impedance Value: 475 Ohm
Lead Channel Pacing Threshold Amplitude: 0.625 V
Lead Channel Pacing Threshold Amplitude: 0.75 V
Lead Channel Pacing Threshold Pulse Width: 0.4 ms
Lead Channel Pacing Threshold Pulse Width: 0.4 ms
Lead Channel Sensing Intrinsic Amplitude: 21.625 mV
Lead Channel Sensing Intrinsic Amplitude: 21.625 mV
Lead Channel Sensing Intrinsic Amplitude: 3.25 mV
Lead Channel Sensing Intrinsic Amplitude: 3.25 mV
Lead Channel Setting Pacing Amplitude: 1.5 V
Lead Channel Setting Pacing Amplitude: 2 V
Lead Channel Setting Pacing Pulse Width: 0.4 ms
Lead Channel Setting Sensing Sensitivity: 0.9 mV

## 2020-09-02 NOTE — Progress Notes (Signed)
Remote pacemaker transmission.   

## 2020-09-13 DIAGNOSIS — U071 COVID-19: Secondary | ICD-10-CM | POA: Diagnosis not present

## 2020-09-13 DIAGNOSIS — R1310 Dysphagia, unspecified: Secondary | ICD-10-CM | POA: Diagnosis not present

## 2020-09-13 DIAGNOSIS — S7291XS Unspecified fracture of right femur, sequela: Secondary | ICD-10-CM | POA: Diagnosis not present

## 2020-09-13 DIAGNOSIS — M6281 Muscle weakness (generalized): Secondary | ICD-10-CM | POA: Diagnosis not present

## 2020-10-13 ENCOUNTER — Telehealth: Payer: Self-pay

## 2020-10-13 ENCOUNTER — Other Ambulatory Visit: Payer: Self-pay | Admitting: Neurological Surgery

## 2020-10-13 ENCOUNTER — Other Ambulatory Visit (HOSPITAL_COMMUNITY): Payer: Self-pay | Admitting: Neurological Surgery

## 2020-10-13 DIAGNOSIS — D333 Benign neoplasm of cranial nerves: Secondary | ICD-10-CM

## 2020-10-13 NOTE — Telephone Encounter (Signed)
I ordered the patient a return kit. I let him know he should receive the return kit in 7-10 business days.

## 2020-10-14 DIAGNOSIS — D333 Benign neoplasm of cranial nerves: Secondary | ICD-10-CM | POA: Diagnosis not present

## 2020-10-14 DIAGNOSIS — U071 COVID-19: Secondary | ICD-10-CM | POA: Diagnosis not present

## 2020-10-14 DIAGNOSIS — H16211 Exposure keratoconjunctivitis, right eye: Secondary | ICD-10-CM | POA: Diagnosis not present

## 2020-10-14 DIAGNOSIS — R1310 Dysphagia, unspecified: Secondary | ICD-10-CM | POA: Diagnosis not present

## 2020-10-14 DIAGNOSIS — M6281 Muscle weakness (generalized): Secondary | ICD-10-CM | POA: Diagnosis not present

## 2020-10-14 DIAGNOSIS — H05243 Constant exophthalmos, bilateral: Secondary | ICD-10-CM | POA: Diagnosis not present

## 2020-10-14 DIAGNOSIS — S7291XS Unspecified fracture of right femur, sequela: Secondary | ICD-10-CM | POA: Diagnosis not present

## 2020-10-14 DIAGNOSIS — S0451XD Injury of facial nerve, right side, subsequent encounter: Secondary | ICD-10-CM | POA: Diagnosis not present

## 2020-11-12 ENCOUNTER — Encounter (HOSPITAL_COMMUNITY): Payer: Self-pay

## 2020-11-12 ENCOUNTER — Ambulatory Visit (HOSPITAL_COMMUNITY): Admission: RE | Admit: 2020-11-12 | Payer: Medicare Other | Source: Ambulatory Visit

## 2020-11-13 DIAGNOSIS — M6281 Muscle weakness (generalized): Secondary | ICD-10-CM | POA: Diagnosis not present

## 2020-11-13 DIAGNOSIS — U071 COVID-19: Secondary | ICD-10-CM | POA: Diagnosis not present

## 2020-11-13 DIAGNOSIS — R1310 Dysphagia, unspecified: Secondary | ICD-10-CM | POA: Diagnosis not present

## 2020-11-13 DIAGNOSIS — S7291XS Unspecified fracture of right femur, sequela: Secondary | ICD-10-CM | POA: Diagnosis not present

## 2020-11-24 ENCOUNTER — Ambulatory Visit (INDEPENDENT_AMBULATORY_CARE_PROVIDER_SITE_OTHER): Payer: Medicare Other

## 2020-11-24 DIAGNOSIS — I442 Atrioventricular block, complete: Secondary | ICD-10-CM | POA: Diagnosis not present

## 2020-11-24 LAB — CUP PACEART REMOTE DEVICE CHECK
Battery Remaining Longevity: 139 mo
Battery Voltage: 3.08 V
Brady Statistic AP VP Percent: 51.95 %
Brady Statistic AP VS Percent: 0 %
Brady Statistic AS VP Percent: 47.78 %
Brady Statistic AS VS Percent: 0.26 %
Brady Statistic RA Percent Paced: 52.07 %
Brady Statistic RV Percent Paced: 99.74 %
Date Time Interrogation Session: 20220607084825
Implantable Lead Implant Date: 20130125
Implantable Lead Implant Date: 20130125
Implantable Lead Location: 753859
Implantable Lead Location: 753860
Implantable Lead Model: 5076
Implantable Pulse Generator Implant Date: 20210902
Lead Channel Impedance Value: 380 Ohm
Lead Channel Impedance Value: 437 Ohm
Lead Channel Impedance Value: 494 Ohm
Lead Channel Impedance Value: 513 Ohm
Lead Channel Pacing Threshold Amplitude: 0.625 V
Lead Channel Pacing Threshold Amplitude: 0.75 V
Lead Channel Pacing Threshold Pulse Width: 0.4 ms
Lead Channel Pacing Threshold Pulse Width: 0.4 ms
Lead Channel Sensing Intrinsic Amplitude: 4.5 mV
Lead Channel Sensing Intrinsic Amplitude: 4.5 mV
Lead Channel Sensing Intrinsic Amplitude: 5.25 mV
Lead Channel Sensing Intrinsic Amplitude: 5.25 mV
Lead Channel Setting Pacing Amplitude: 1.5 V
Lead Channel Setting Pacing Amplitude: 2 V
Lead Channel Setting Pacing Pulse Width: 0.4 ms
Lead Channel Setting Sensing Sensitivity: 0.9 mV

## 2020-12-15 ENCOUNTER — Other Ambulatory Visit: Payer: Self-pay

## 2020-12-15 ENCOUNTER — Ambulatory Visit (HOSPITAL_COMMUNITY)
Admission: RE | Admit: 2020-12-15 | Discharge: 2020-12-15 | Disposition: A | Discharge status: Home / Self Care | Payer: Medicare Other | Source: Ambulatory Visit | Attending: Neurological Surgery | Admitting: Neurological Surgery

## 2020-12-15 DIAGNOSIS — D333 Benign neoplasm of cranial nerves: Secondary | ICD-10-CM | POA: Insufficient documentation

## 2020-12-15 MED ORDER — GADOBUTROL 1 MMOL/ML IV SOLN
10.0000 mL | Freq: Once | INTRAVENOUS | Status: AC | PRN
  Administered 2020-12-15: 14:00:00 10 mL via INTRAVENOUS

## 2020-12-15 NOTE — Progress Notes (Signed)
Remote pacemaker transmission.   

## 2020-12-15 NOTE — Progress Notes (Signed)
Per order, Changed device setting to DOO 85 MRI Sure Scan for the patient to have MRI.   After scan will program back to previous settings and send transmission.

## 2020-12-25 DIAGNOSIS — D333 Benign neoplasm of cranial nerves: Secondary | ICD-10-CM | POA: Diagnosis not present

## 2020-12-28 DIAGNOSIS — D352 Benign neoplasm of pituitary gland: Secondary | ICD-10-CM | POA: Diagnosis not present

## 2021-01-13 DIAGNOSIS — S7291XS Unspecified fracture of right femur, sequela: Secondary | ICD-10-CM | POA: Diagnosis not present

## 2021-01-13 DIAGNOSIS — R1310 Dysphagia, unspecified: Secondary | ICD-10-CM | POA: Diagnosis not present

## 2021-01-13 DIAGNOSIS — U071 COVID-19: Secondary | ICD-10-CM | POA: Diagnosis not present

## 2021-01-13 DIAGNOSIS — M6281 Muscle weakness (generalized): Secondary | ICD-10-CM | POA: Diagnosis not present

## 2021-01-21 DIAGNOSIS — S0451XD Injury of facial nerve, right side, subsequent encounter: Secondary | ICD-10-CM | POA: Diagnosis not present

## 2021-01-21 DIAGNOSIS — H16211 Exposure keratoconjunctivitis, right eye: Secondary | ICD-10-CM | POA: Diagnosis not present

## 2021-01-21 DIAGNOSIS — D333 Benign neoplasm of cranial nerves: Secondary | ICD-10-CM | POA: Diagnosis not present

## 2021-01-21 DIAGNOSIS — H02521 Blepharophimosis right upper eyelid: Secondary | ICD-10-CM | POA: Diagnosis not present

## 2021-02-12 IMAGING — CT CT HEAD W/O CM
4 series · 16 of 47 positions shown, 18 images · non-contrast
Comparison: CT head 10/11/2019

CLINICAL DATA: Postop resection of cerebellar pontine angle mass on
the right.

EXAM:
CT HEAD WITHOUT CONTRAST
TECHNIQUE: Contiguous axial images were obtained from the base of the skull
through the vertex without intravenous contrast.

[Series 3: head without · axial · non-contrast · 0.51mm/px · z∈[+1130,+1245]mm · 6 of 33 slices shown, 8 images]
[im 5/33  brain]
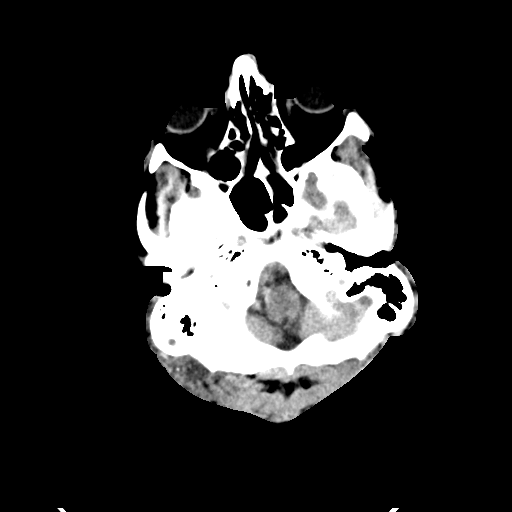
[im 5/33  bone]
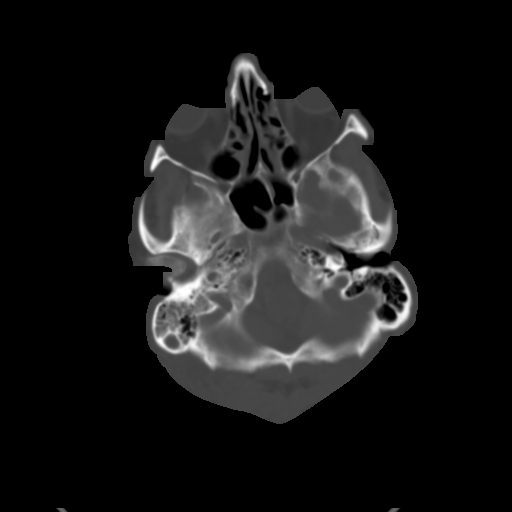
[im 10/33  brain]
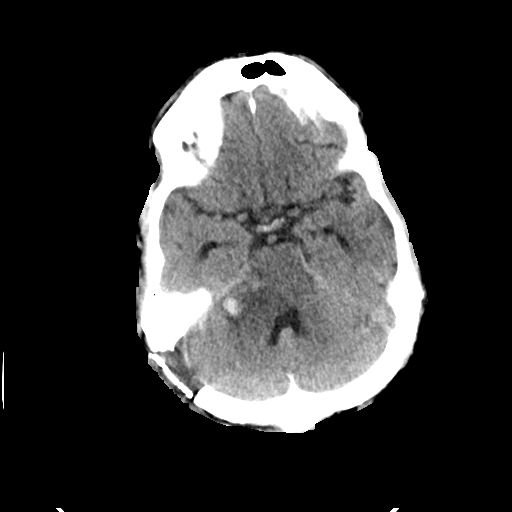
[im 14/33  brain]
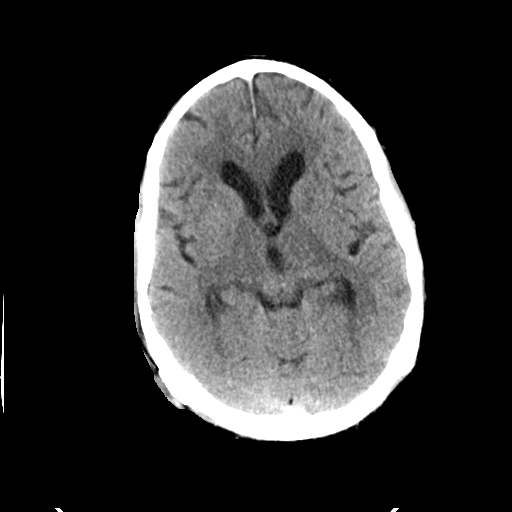
[im 19/33  brain]
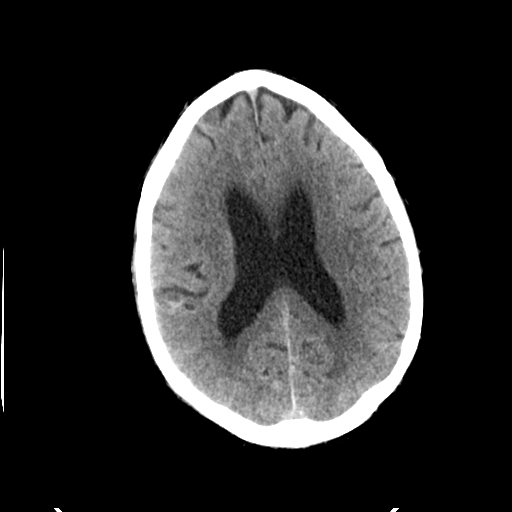
[im 23/33  brain]
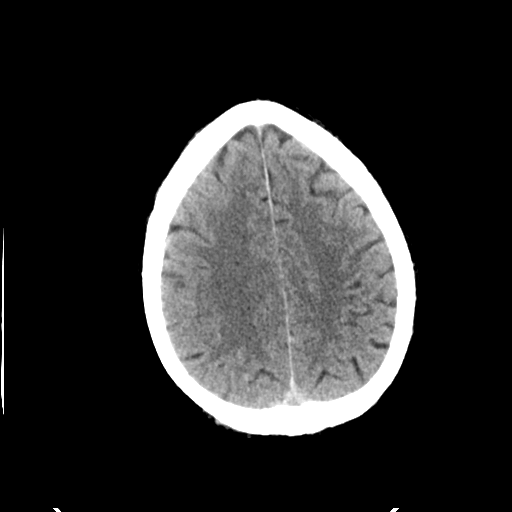
[im 23/33  bone]
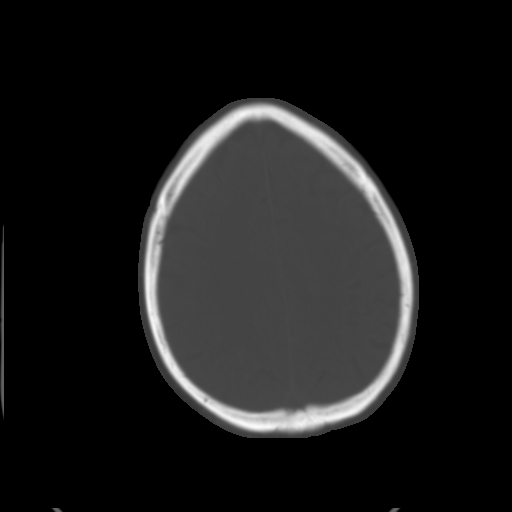
[im 28/33  brain]
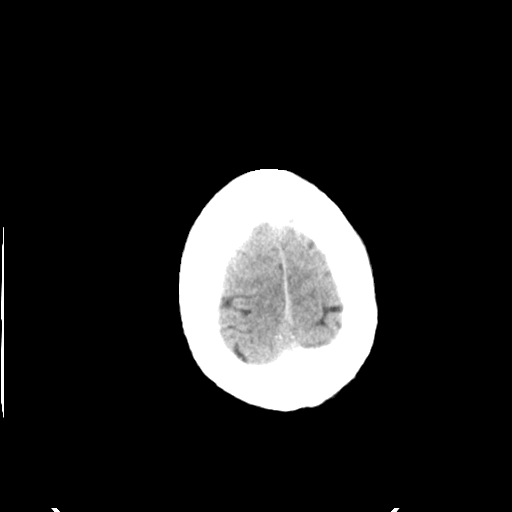

[Series 4: head bone · axial · 0.51mm/px · z∈[+1126,+1182]mm · 4 of 85 slices shown]
[im 9/85  bone]
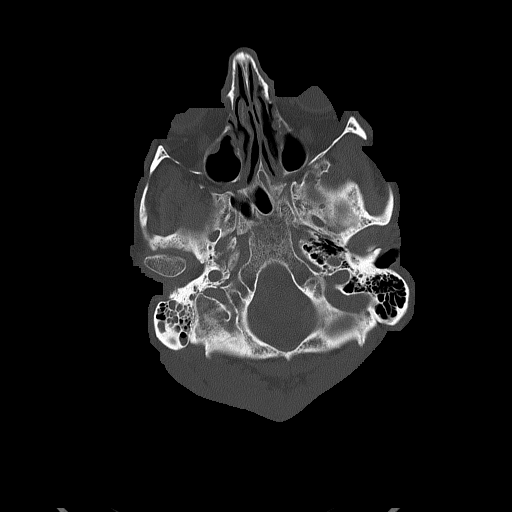
[im 17/85  bone]
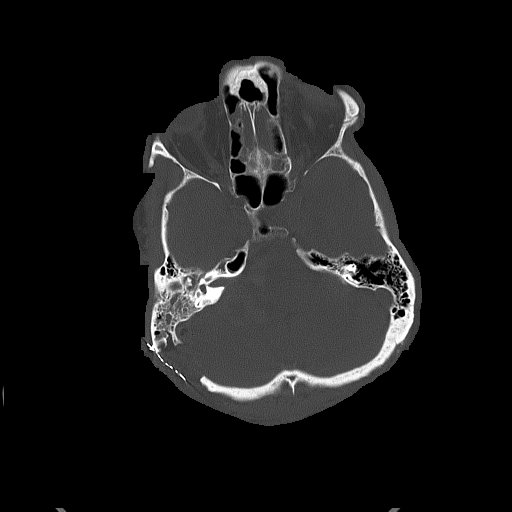
[im 29/85  bone]
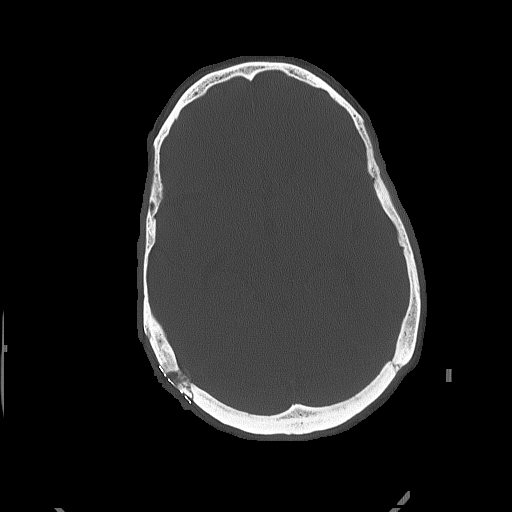
[im 37/85  bone]
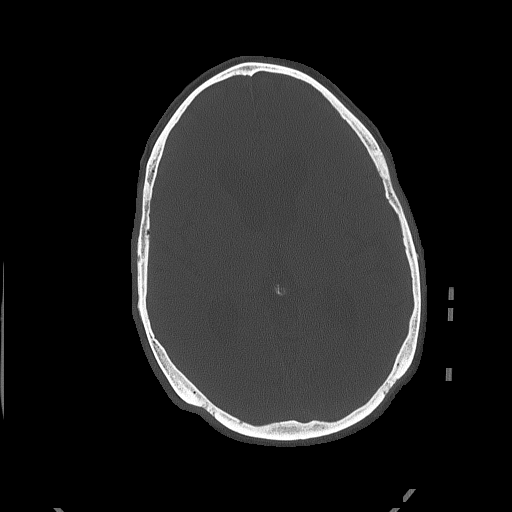

[Series 5: head without cor · coronal · non-contrast · 0.33mm/px · 3 of 72 slices shown]
[im 24/72  brain]
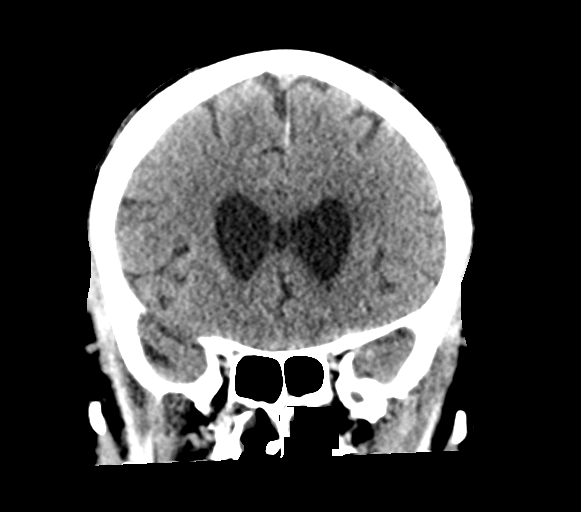
[im 32/72  brain]
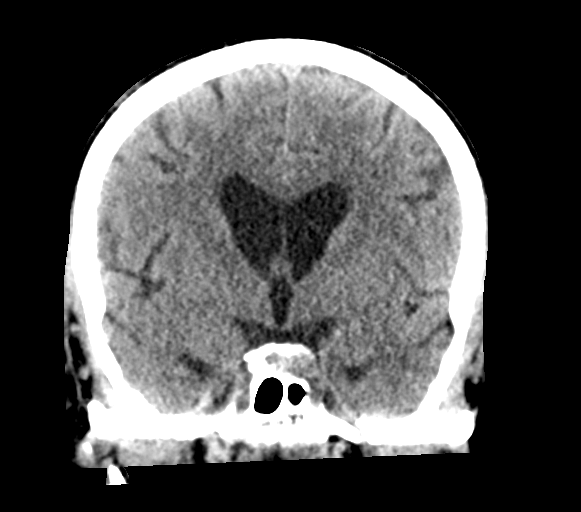
[im 40/72  brain]
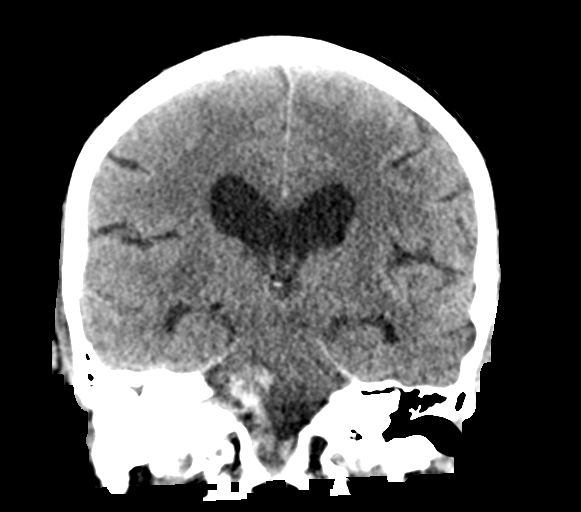

[Series 6: head without sag · sagittal · non-contrast · 0.33mm/px · 3 of 64 slices shown]
[im 22/64  brain]
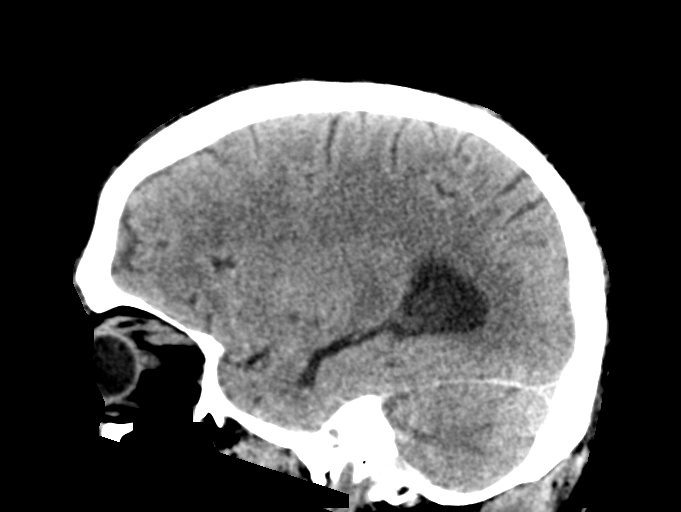
[im 32/64  brain]
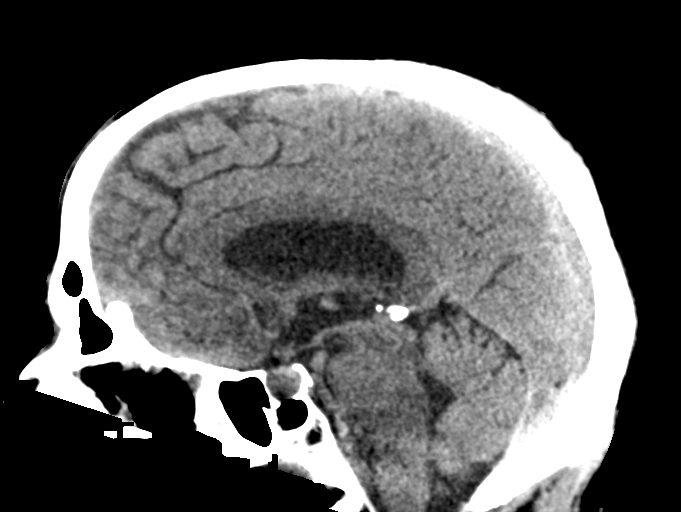
[im 43/64  brain]
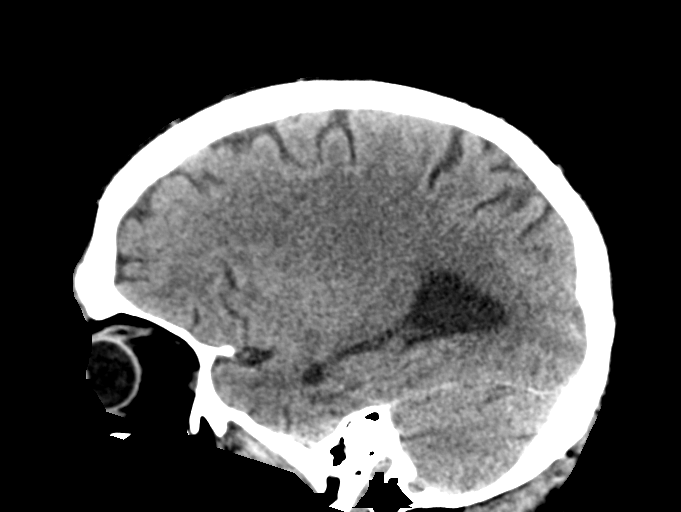

[16 of 47 positions shown; findings below may reference images not displayed]

FINDINGS: Brain: Right occipital craniotomy for tumor resection. Hemorrhage in
the tumor bed right cerebellar pontine angle appears improved. Mild
intraventricular hemorrhage in the occipital horns bilaterally is
unchanged. Mild ventricular enlargement appears unchanged. Small
amount of subarachnoid hemorrhage in the right frontal parietal lobe
is unchanged. Subarachnoid hemorrhage left parietal lobe has
improved. No new hemorrhage.

Vascular: Negative for hyperdense vessel

Skull: Right occipital craniotomy

Sinuses/Orbits: Right mastoid effusion has progressed since the
prior CT.

Mild mucosal edema paranasal sinuses without air-fluid level. Left
mastoid sinus remains clear.

Other: None
IMPRESSION: 1. Interval improvement in hemorrhage in the tumor bed right
cerebellar pontine angle .
2. Mild ventricular hemorrhage and mild hydrocephalus unchanged.
Subarachnoid hemorrhage bilaterally appears unchanged on the right
but improved on the left. No new hemorrhage.

## 2021-02-12 IMAGING — DX DG CHEST 1V PORT
1 series · 1 of 1 positions shown · non-contrast
Comparison: October 12, 2019

CLINICAL DATA: Hypoxia.

EXAM:
PORTABLE CHEST 1 VIEW

[chest ap]
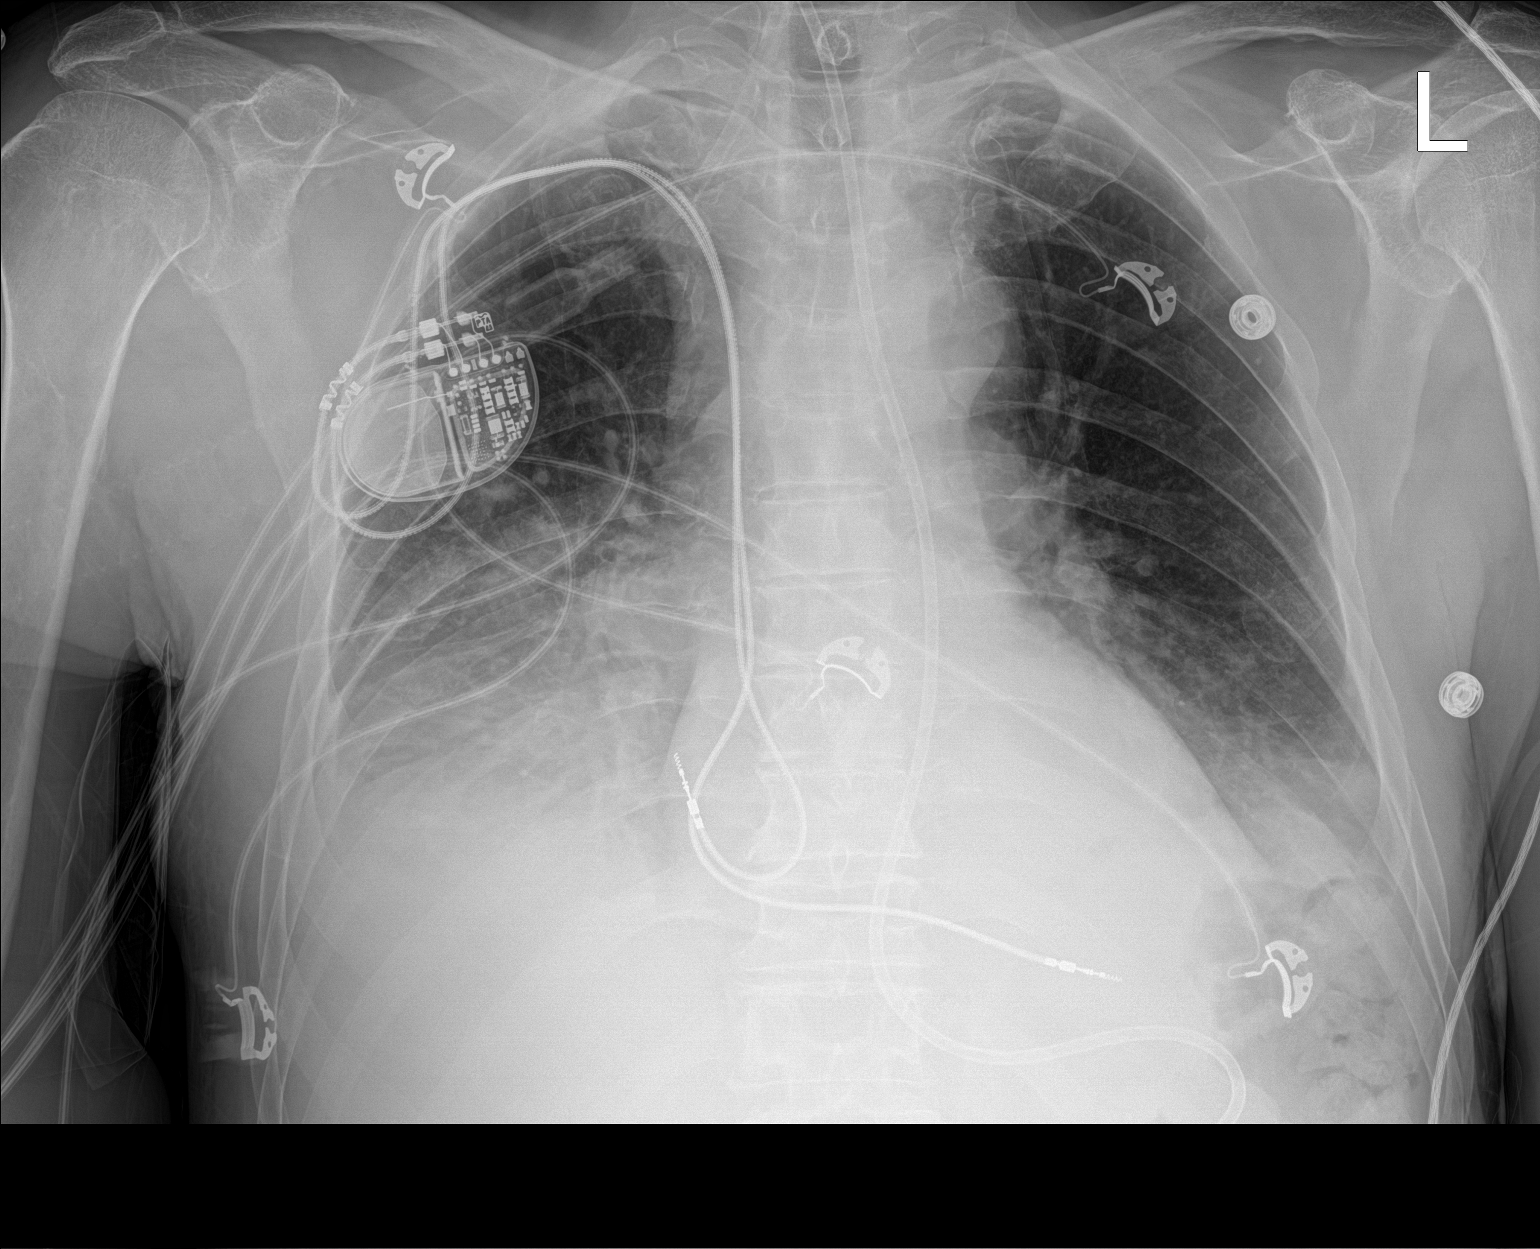

[1 of 1 positions shown; findings below may reference images not displayed]

FINDINGS: There is stable nasogastric tube positioning.

A dual lead AICD is in place.

Mild, stable areas of bibasilar atelectasis and/or infiltrate are
seen, right greater than left.

No pneumothorax is identified.

The heart size and mediastinal contours are within normal limits.

The visualized skeletal structures are unremarkable.
IMPRESSION: No significant interval change when compared to the prior chest
plain film dated October 12, 2019.

## 2021-02-13 DIAGNOSIS — R1310 Dysphagia, unspecified: Secondary | ICD-10-CM | POA: Diagnosis not present

## 2021-02-13 DIAGNOSIS — S7291XS Unspecified fracture of right femur, sequela: Secondary | ICD-10-CM | POA: Diagnosis not present

## 2021-02-13 DIAGNOSIS — U071 COVID-19: Secondary | ICD-10-CM | POA: Diagnosis not present

## 2021-02-13 DIAGNOSIS — M6281 Muscle weakness (generalized): Secondary | ICD-10-CM | POA: Diagnosis not present

## 2021-02-13 IMAGING — DX DG CHEST 1V PORT
1 series · 1 of 1 positions shown · non-contrast
Comparison: Chest x-ray 10/15/2019.

CLINICAL DATA: 63-year-old male status post tracheostomy.

EXAM:
PORTABLE CHEST 1 VIEW

[chest]
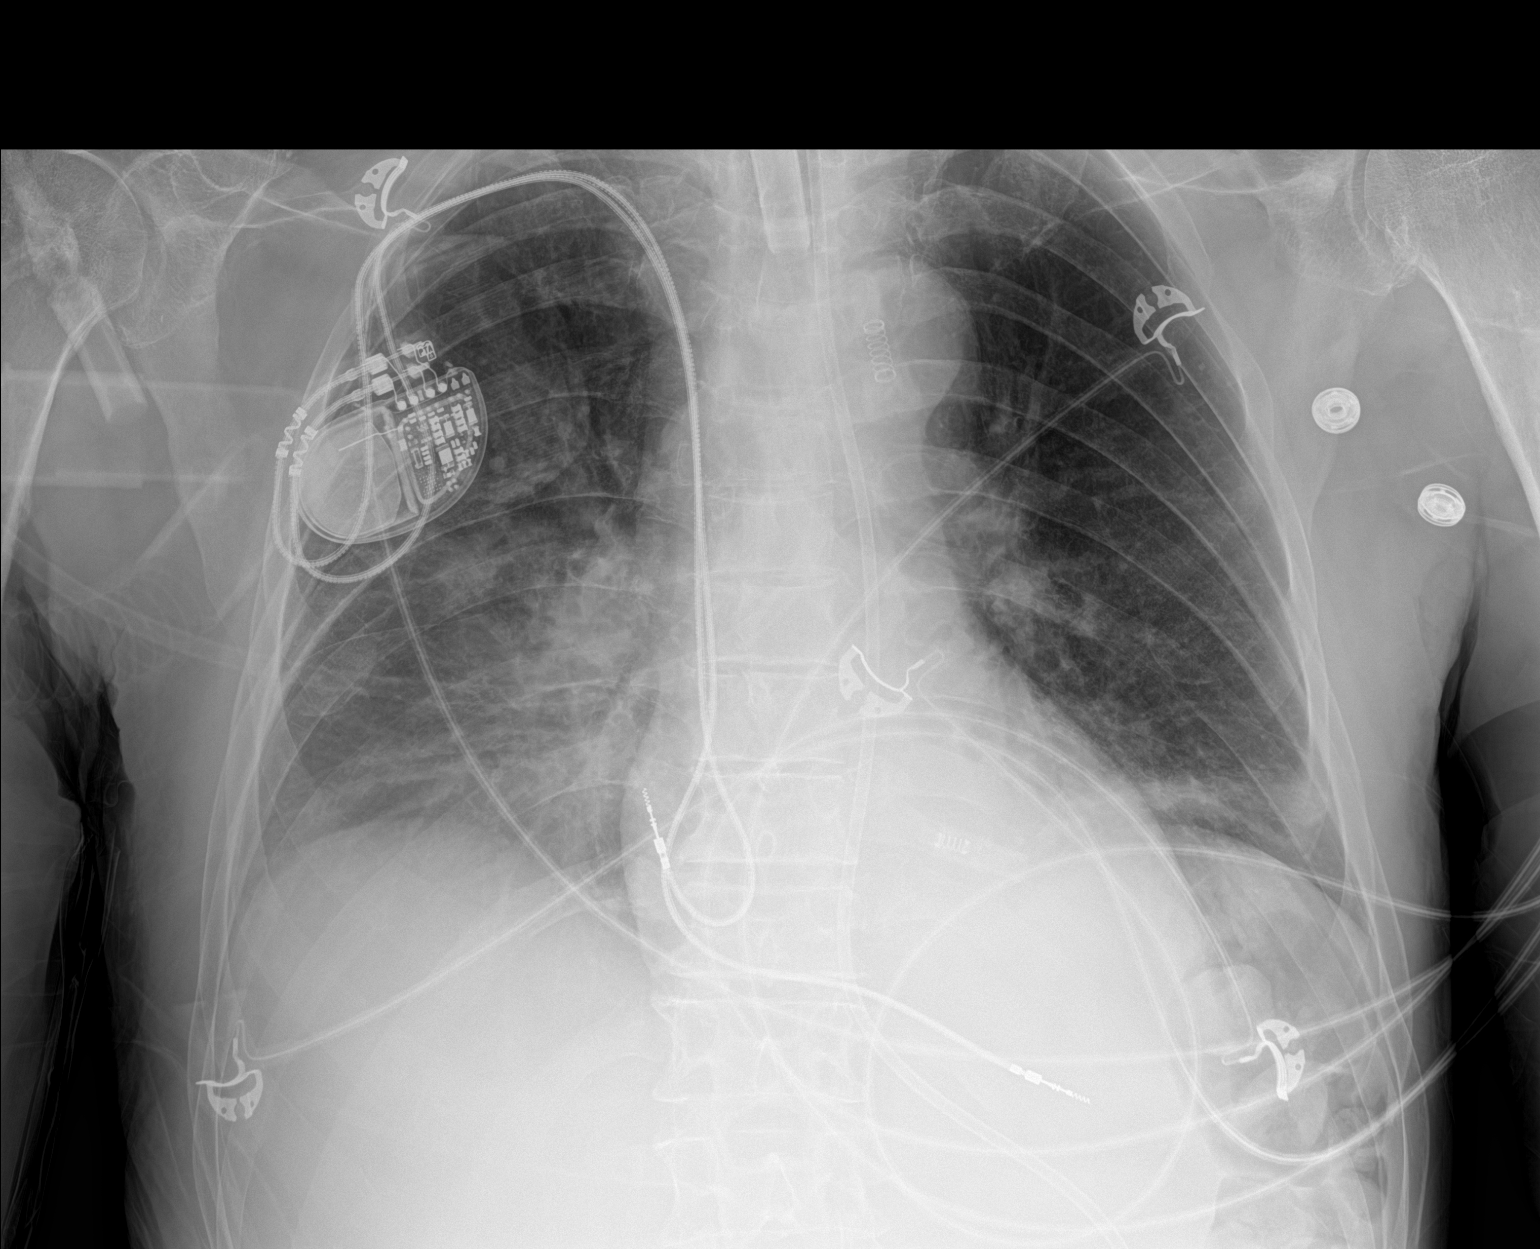

[1 of 1 positions shown; findings below may reference images not displayed]

FINDINGS: A tracheostomy tube is in place with tip 5.9 cm above the carina. A
feeding tube is seen extending into the abdomen, however, the tip of
the feeding tube extends below the lower margin of the image. Lung
volumes are low. Multifocal airspace consolidation most severe in
the right mid to lower lung and at the left lung base. Probable
subsegmental atelectasis in the lung bases bilaterally. Small right
pleural effusion. No left pleural effusion. No evidence of pulmonary
edema. No pneumothorax. Heart size is normal. Upper mediastinal
contours are within normal limits. Right-sided pacemaker device in
place with lead tips projecting over the expected location of the
right atrium and right ventricle.
IMPRESSION: 1. Support apparatus, as above.
2. The appearance the chest is most compatible with multilobar
bilateral pneumonia with small right pleural effusion, as above.

## 2021-02-18 ENCOUNTER — Telehealth: Payer: Self-pay | Admitting: *Deleted

## 2021-02-18 NOTE — Telephone Encounter (Signed)
   [facility] Pre-operative Risk Assessment    Patient Name: Casey Richard  DOB: Aug 31, 1955 MRN: [id number]  HEARTCARE STAFF:  - IMPORTANT!!!!!! Under Visit Info/Reason for Call, type in Other and utilize the format Clearance MM/DD/YY or Clearance TBD. Do not use dashes or single digits. - Please review there is not already an duplicate clearance open for this procedure. - If request is for dental extraction, please clarify the # of teeth to be extracted. - If the patient is currently at the dentist's office, call Pre-Op Callback Staff (MA/nurse) to input urgent request.  - If the patient is not currently in the dentist office, please route to the Pre-Op pool.  Request for surgical clearance:  What type of surgery is being performed?  RIGHT UPPER EYELID GOLD WEIGHT 1.6GM PLACEMENT OPENING OF RIGHT EYELID AND POSSIBLE NEED TO REOPEN  When is this surgery scheduled?  02/24/21  What type of clearance is required (medical clearance vs. Pharmacy clearance to hold med vs. Both)?  MEDICAL  Are there any medications that need to be held prior to surgery and how long?  N/A  Practice name and name of physician performing surgery?  PER[facility]OPERATIVE [facility]MENT AT [facility] / DR. Leonard Schwartz   What is the office phone number?  [phone]   7.   What is the office fax number?  [phone]  8.   Anesthesia type (None, local, MAC, general) ?    Jeanann Lewandowsky 02/18/2021, 1:23 PM  _________________________________________________________________   (provider comments below)

## 2021-02-18 NOTE — Telephone Encounter (Signed)
Left message to call back and ask to speak with preop team.  Darreld Mclean, PA-C 02/18/2021 1:47 PM

## 2021-02-23 ENCOUNTER — Ambulatory Visit (INDEPENDENT_AMBULATORY_CARE_PROVIDER_SITE_OTHER): Payer: Medicare Other

## 2021-02-23 DIAGNOSIS — I442 Atrioventricular block, complete: Secondary | ICD-10-CM

## 2021-02-23 LAB — CUP PACEART REMOTE DEVICE CHECK
Battery Remaining Longevity: 138 mo
Battery Voltage: 3.04 V
Brady Statistic AP VP Percent: 50.13 %
Brady Statistic AP VS Percent: 0 %
Brady Statistic AS VP Percent: 49.54 %
Brady Statistic AS VS Percent: 0.33 %
Brady Statistic RA Percent Paced: 50.24 %
Brady Statistic RV Percent Paced: 99.66 %
Date Time Interrogation Session: 20220906055714
Implantable Lead Implant Date: 20130125
Implantable Lead Implant Date: 20130125
Implantable Lead Location: 753859
Implantable Lead Location: 753860
Implantable Lead Model: 5076
Implantable Pulse Generator Implant Date: 20210902
Lead Channel Impedance Value: 418 Ohm
Lead Channel Impedance Value: 475 Ohm
Lead Channel Impedance Value: 551 Ohm
Lead Channel Impedance Value: 551 Ohm
Lead Channel Pacing Threshold Amplitude: 0.625 V
Lead Channel Pacing Threshold Amplitude: 0.625 V
Lead Channel Pacing Threshold Pulse Width: 0.4 ms
Lead Channel Pacing Threshold Pulse Width: 0.4 ms
Lead Channel Sensing Intrinsic Amplitude: 10.625 mV
Lead Channel Sensing Intrinsic Amplitude: 10.625 mV
Lead Channel Sensing Intrinsic Amplitude: 4.375 mV
Lead Channel Sensing Intrinsic Amplitude: 4.375 mV
Lead Channel Setting Pacing Amplitude: 1.5 V
Lead Channel Setting Pacing Amplitude: 2 V
Lead Channel Setting Pacing Pulse Width: 0.4 ms
Lead Channel Setting Sensing Sensitivity: 0.9 mV

## 2021-02-24 DIAGNOSIS — H02521 Blepharophimosis right upper eyelid: Secondary | ICD-10-CM | POA: Diagnosis not present

## 2021-02-24 DIAGNOSIS — D333 Benign neoplasm of cranial nerves: Secondary | ICD-10-CM | POA: Diagnosis not present

## 2021-02-24 DIAGNOSIS — H02231 Paralytic lagophthalmos right upper eyelid: Secondary | ICD-10-CM | POA: Diagnosis not present

## 2021-02-24 DIAGNOSIS — H16211 Exposure keratoconjunctivitis, right eye: Secondary | ICD-10-CM | POA: Diagnosis not present

## 2021-02-24 DIAGNOSIS — S0451XD Injury of facial nerve, right side, subsequent encounter: Secondary | ICD-10-CM | POA: Diagnosis not present

## 2021-02-24 DIAGNOSIS — H53481 Generalized contraction of visual field, right eye: Secondary | ICD-10-CM | POA: Diagnosis not present

## 2021-02-24 DIAGNOSIS — S0451XS Injury of facial nerve, right side, sequela: Secondary | ICD-10-CM | POA: Diagnosis not present

## 2021-03-03 NOTE — Progress Notes (Signed)
Remote pacemaker transmission.   

## 2021-03-08 DIAGNOSIS — H40033 Anatomical narrow angle, bilateral: Secondary | ICD-10-CM | POA: Diagnosis not present

## 2021-03-08 DIAGNOSIS — H43393 Other vitreous opacities, bilateral: Secondary | ICD-10-CM | POA: Diagnosis not present

## 2021-03-09 DIAGNOSIS — Z0001 Encounter for general adult medical examination with abnormal findings: Secondary | ICD-10-CM | POA: Diagnosis not present

## 2021-03-09 DIAGNOSIS — I10 Essential (primary) hypertension: Secondary | ICD-10-CM | POA: Diagnosis not present

## 2021-03-09 DIAGNOSIS — E559 Vitamin D deficiency, unspecified: Secondary | ICD-10-CM | POA: Diagnosis not present

## 2021-03-09 DIAGNOSIS — L2082 Flexural eczema: Secondary | ICD-10-CM | POA: Diagnosis not present

## 2021-03-09 DIAGNOSIS — I255 Ischemic cardiomyopathy: Secondary | ICD-10-CM | POA: Diagnosis not present

## 2021-03-09 DIAGNOSIS — R600 Localized edema: Secondary | ICD-10-CM | POA: Diagnosis not present

## 2021-03-09 DIAGNOSIS — Z87891 Personal history of nicotine dependence: Secondary | ICD-10-CM | POA: Diagnosis not present

## 2021-03-09 DIAGNOSIS — R7303 Prediabetes: Secondary | ICD-10-CM | POA: Diagnosis not present

## 2021-03-09 DIAGNOSIS — E538 Deficiency of other specified B group vitamins: Secondary | ICD-10-CM | POA: Diagnosis not present

## 2021-03-09 NOTE — Telephone Encounter (Signed)
I tried to return call to Dr. Earnie Larsson office for clarification, left message for call back.

## 2021-03-09 NOTE — Telephone Encounter (Signed)
   Name: Casey Richard  DOB: 1955-07-11  MRN: [id number]  Primary Cardiologist: Sanda Klein, MD  Chart reviewed as part of pre-operative protocol coverage. Very complex medical history. Tried to call patient on both #s but got VM. LMTCB. Will route to callback to complete some of the information from the clearance request - need information on whether surgery is still pending (original date listed 9/7) and what type of anesthesia.   Charlie Pitter, PA-C  03/09/2021, 10:10 AM

## 2021-03-09 NOTE — Telephone Encounter (Signed)
Left message for Dr. Earnie Larsson office to call back as we are needing to confirm and clarify information on the clearance request received. [phone] is what I called as I did a Google search since we did not have a phone # for the requesting office listed .

## 2021-03-09 NOTE — Telephone Encounter (Signed)
Casey Richard with Dr. Earnie Larsson office returned call.  Was unable to to contact pre-op CMA for transfer. Call was disconnected before I was able to obtain additional information. Please return call when able.

## 2021-03-10 NOTE — Telephone Encounter (Signed)
2nd attempt to reach surgeon's office regarding needing additional information.  Left a message for them to call back.

## 2021-03-12 NOTE — Telephone Encounter (Signed)
Left message for requesting office to please call back and confirm type of anesthesia to be used, also if clearance is still needed as it looks like original clearance was 02/24/21?

## 2021-03-15 NOTE — Telephone Encounter (Signed)
Will remove from preop pool given that patient already had surgery.

## 2021-03-15 NOTE — Telephone Encounter (Signed)
I s/w Ginger today with Dr. Beacher May office and confirmed the pt had his surgery on 02/24/21 as well as his post op appt 03/11/21 with Dr. Sabino Dick. Ginger was not sure why we received a clearance request as the pt is not on any medications that would have needed to be held. I thanked Ginger for her help in this matter. I will fax notes to Dr. Beacher May office now only as an [place]. I will remove from the pre op call back pool.

## 2021-04-05 ENCOUNTER — Telehealth: Payer: Self-pay

## 2021-04-05 NOTE — Telephone Encounter (Signed)
The patient is requesting a personal phone call from Dr. Sallyanne Kuster. He states to call him before he leaves the office or on his way home. I told him I will send a message his phone number is [phone]

## 2021-05-25 ENCOUNTER — Ambulatory Visit (INDEPENDENT_AMBULATORY_CARE_PROVIDER_SITE_OTHER): Payer: Medicare Other

## 2021-05-25 DIAGNOSIS — I442 Atrioventricular block, complete: Secondary | ICD-10-CM | POA: Diagnosis not present

## 2021-05-25 LAB — CUP PACEART REMOTE DEVICE CHECK
Battery Remaining Longevity: 132 mo
Battery Voltage: 3.03 V
Brady Statistic AP VP Percent: 6.4 %
Brady Statistic AP VS Percent: 0.01 %
Brady Statistic AS VP Percent: 92.58 %
Brady Statistic AS VS Percent: 1.02 %
Brady Statistic RA Percent Paced: 6.43 %
Brady Statistic RV Percent Paced: 98.98 %
Date Time Interrogation Session: 20221205203736
Implantable Lead Implant Date: 20130125
Implantable Lead Implant Date: 20130125
Implantable Lead Location: 753859
Implantable Lead Location: 753860
Implantable Lead Model: 5076
Implantable Pulse Generator Implant Date: 20210902
Lead Channel Impedance Value: 380 Ohm
Lead Channel Impedance Value: 418 Ohm
Lead Channel Impedance Value: 475 Ohm
Lead Channel Impedance Value: 494 Ohm
Lead Channel Pacing Threshold Amplitude: 0.625 V
Lead Channel Pacing Threshold Amplitude: 0.625 V
Lead Channel Pacing Threshold Pulse Width: 0.4 ms
Lead Channel Pacing Threshold Pulse Width: 0.4 ms
Lead Channel Sensing Intrinsic Amplitude: 10.625 mV
Lead Channel Sensing Intrinsic Amplitude: 10.625 mV
Lead Channel Sensing Intrinsic Amplitude: 5.75 mV
Lead Channel Sensing Intrinsic Amplitude: 5.75 mV
Lead Channel Setting Pacing Amplitude: 1.5 V
Lead Channel Setting Pacing Amplitude: 2 V
Lead Channel Setting Pacing Pulse Width: 0.4 ms
Lead Channel Setting Sensing Sensitivity: 0.9 mV

## 2021-06-02 NOTE — Progress Notes (Signed)
Remote pacemaker transmission.   

## 2021-06-28 ENCOUNTER — Encounter: Payer: Medicare Other | Admitting: Cardiovascular Disease

## 2021-08-19 DIAGNOSIS — T85898A Other specified complication of other internal prosthetic devices, implants and grafts, initial encounter: Secondary | ICD-10-CM | POA: Diagnosis not present

## 2021-08-19 DIAGNOSIS — L080 Pyoderma: Secondary | ICD-10-CM | POA: Diagnosis not present

## 2021-08-19 DIAGNOSIS — S0451XD Injury of facial nerve, right side, subsequent encounter: Secondary | ICD-10-CM | POA: Diagnosis not present

## 2021-08-19 DIAGNOSIS — T8130XA Disruption of wound, unspecified, initial encounter: Secondary | ICD-10-CM | POA: Diagnosis not present

## 2021-08-19 DIAGNOSIS — H00031 Abscess of right upper eyelid: Secondary | ICD-10-CM | POA: Diagnosis not present

## 2021-08-19 DIAGNOSIS — H02811 Retained foreign body in right upper eyelid: Secondary | ICD-10-CM | POA: Diagnosis not present

## 2021-08-24 ENCOUNTER — Ambulatory Visit (INDEPENDENT_AMBULATORY_CARE_PROVIDER_SITE_OTHER): Payer: Medicare Other

## 2021-08-24 DIAGNOSIS — I442 Atrioventricular block, complete: Secondary | ICD-10-CM

## 2021-08-24 LAB — CUP PACEART REMOTE DEVICE CHECK
Battery Remaining Longevity: 127 mo
Battery Voltage: 3.02 V
Brady Statistic AP VP Percent: 25.82 %
Brady Statistic AP VS Percent: 0.03 %
Brady Statistic AS VP Percent: 73.41 %
Brady Statistic AS VS Percent: 0.74 %
Brady Statistic RA Percent Paced: 25.89 %
Brady Statistic RV Percent Paced: 99.23 %
Date Time Interrogation Session: 20230307002947
Implantable Lead Implant Date: 20130125
Implantable Lead Implant Date: 20130125
Implantable Lead Location: 753859
Implantable Lead Location: 753860
Implantable Lead Model: 5076
Implantable Pulse Generator Implant Date: 20210902
Lead Channel Impedance Value: 342 Ohm
Lead Channel Impedance Value: 380 Ohm
Lead Channel Impedance Value: 437 Ohm
Lead Channel Impedance Value: 456 Ohm
Lead Channel Pacing Threshold Amplitude: 0.625 V
Lead Channel Pacing Threshold Amplitude: 0.75 V
Lead Channel Pacing Threshold Pulse Width: 0.4 ms
Lead Channel Pacing Threshold Pulse Width: 0.4 ms
Lead Channel Sensing Intrinsic Amplitude: 10.625 mV
Lead Channel Sensing Intrinsic Amplitude: 10.625 mV
Lead Channel Sensing Intrinsic Amplitude: 5.5 mV
Lead Channel Sensing Intrinsic Amplitude: 5.5 mV
Lead Channel Setting Pacing Amplitude: 1.5 V
Lead Channel Setting Pacing Amplitude: 2 V
Lead Channel Setting Pacing Pulse Width: 0.4 ms
Lead Channel Setting Sensing Sensitivity: 0.9 mV

## 2021-09-06 NOTE — Progress Notes (Signed)
Remote pacemaker transmission.   

## 2021-10-21 DIAGNOSIS — R432 Parageusia: Secondary | ICD-10-CM | POA: Diagnosis not present

## 2021-10-21 DIAGNOSIS — H16211 Exposure keratoconjunctivitis, right eye: Secondary | ICD-10-CM | POA: Diagnosis not present

## 2021-10-21 DIAGNOSIS — H5702 Anisocoria: Secondary | ICD-10-CM | POA: Diagnosis not present

## 2021-10-21 DIAGNOSIS — H02231 Paralytic lagophthalmos right upper eyelid: Secondary | ICD-10-CM | POA: Diagnosis not present

## 2021-10-21 DIAGNOSIS — H00031 Abscess of right upper eyelid: Secondary | ICD-10-CM | POA: Diagnosis not present

## 2021-10-21 DIAGNOSIS — H02532 Eyelid retraction right lower eyelid: Secondary | ICD-10-CM | POA: Diagnosis not present

## 2021-10-21 DIAGNOSIS — S0451XD Injury of facial nerve, right side, subsequent encounter: Secondary | ICD-10-CM | POA: Diagnosis not present

## 2021-10-22 ENCOUNTER — Other Ambulatory Visit (HOSPITAL_COMMUNITY): Payer: Self-pay | Admitting: Neurological Surgery

## 2021-10-22 ENCOUNTER — Other Ambulatory Visit: Payer: Self-pay | Admitting: Neurological Surgery

## 2021-10-22 DIAGNOSIS — D333 Benign neoplasm of cranial nerves: Secondary | ICD-10-CM

## 2021-10-29 ENCOUNTER — Ambulatory Visit (HOSPITAL_COMMUNITY)
Admission: RE | Admit: 2021-10-29 | Discharge: 2021-10-29 | Disposition: A | Discharge status: Home / Self Care | Payer: Medicare Other | Source: Ambulatory Visit | Attending: Neurological Surgery | Admitting: Neurological Surgery

## 2021-10-29 DIAGNOSIS — D333 Benign neoplasm of cranial nerves: Secondary | ICD-10-CM | POA: Insufficient documentation

## 2021-10-29 DIAGNOSIS — D3610 Benign neoplasm of peripheral nerves and autonomic nervous system, unspecified: Secondary | ICD-10-CM | POA: Diagnosis not present

## 2021-10-29 MED ORDER — GADOBUTROL 1 MMOL/ML IV SOLN
10.0000 mL | Freq: Once | INTRAVENOUS | Status: AC | PRN
  Administered 2021-10-29: 10 mL via INTRAVENOUS

## 2021-10-29 NOTE — Progress Notes (Signed)
Changed device settings for MRI to  ?DOO at 80 bpm  ?Will Program device back to pre-MRI settings after completion of exam, and send transmission. ?

## 2021-11-04 DIAGNOSIS — D333 Benign neoplasm of cranial nerves: Secondary | ICD-10-CM | POA: Diagnosis not present

## 2021-11-08 DIAGNOSIS — L2082 Flexural eczema: Secondary | ICD-10-CM | POA: Diagnosis not present

## 2021-11-08 DIAGNOSIS — E538 Deficiency of other specified B group vitamins: Secondary | ICD-10-CM | POA: Diagnosis not present

## 2021-11-08 DIAGNOSIS — I10 Essential (primary) hypertension: Secondary | ICD-10-CM | POA: Diagnosis not present

## 2021-11-08 DIAGNOSIS — R7303 Prediabetes: Secondary | ICD-10-CM | POA: Diagnosis not present

## 2021-11-08 DIAGNOSIS — I255 Ischemic cardiomyopathy: Secondary | ICD-10-CM | POA: Diagnosis not present

## 2021-11-08 DIAGNOSIS — E559 Vitamin D deficiency, unspecified: Secondary | ICD-10-CM | POA: Diagnosis not present

## 2021-11-08 DIAGNOSIS — Z0000 Encounter for general adult medical examination without abnormal findings: Secondary | ICD-10-CM | POA: Diagnosis not present

## 2021-11-08 DIAGNOSIS — Z87891 Personal history of nicotine dependence: Secondary | ICD-10-CM | POA: Diagnosis not present

## 2021-11-08 DIAGNOSIS — R202 Paresthesia of skin: Secondary | ICD-10-CM | POA: Diagnosis not present

## 2021-11-08 DIAGNOSIS — R600 Localized edema: Secondary | ICD-10-CM | POA: Diagnosis not present

## 2021-11-23 ENCOUNTER — Ambulatory Visit (INDEPENDENT_AMBULATORY_CARE_PROVIDER_SITE_OTHER): Payer: Medicare Other

## 2021-11-23 DIAGNOSIS — I442 Atrioventricular block, complete: Secondary | ICD-10-CM

## 2021-11-23 LAB — CUP PACEART REMOTE DEVICE CHECK
Battery Remaining Longevity: 123 mo
Battery Voltage: 3.01 V
Brady Statistic AP VP Percent: 69.73 %
Brady Statistic AP VS Percent: 0 %
Brady Statistic AS VP Percent: 29.84 %
Brady Statistic AS VS Percent: 0.43 %
Brady Statistic RA Percent Paced: 69.92 %
Brady Statistic RV Percent Paced: 99.57 %
Date Time Interrogation Session: 20230605221655
Implantable Lead Implant Date: 20130125
Implantable Lead Implant Date: 20130125
Implantable Lead Location: 753859
Implantable Lead Location: 753860
Implantable Lead Model: 5076
Implantable Pulse Generator Implant Date: 20210902
Lead Channel Impedance Value: 323 Ohm
Lead Channel Impedance Value: 380 Ohm
Lead Channel Impedance Value: 437 Ohm
Lead Channel Impedance Value: 437 Ohm
Lead Channel Pacing Threshold Amplitude: 0.625 V
Lead Channel Pacing Threshold Amplitude: 0.75 V
Lead Channel Pacing Threshold Pulse Width: 0.4 ms
Lead Channel Pacing Threshold Pulse Width: 0.4 ms
Lead Channel Sensing Intrinsic Amplitude: 10.625 mV
Lead Channel Sensing Intrinsic Amplitude: 10.625 mV
Lead Channel Sensing Intrinsic Amplitude: 3 mV
Lead Channel Sensing Intrinsic Amplitude: 3 mV
Lead Channel Setting Pacing Amplitude: 1.5 V
Lead Channel Setting Pacing Amplitude: 2 V
Lead Channel Setting Pacing Pulse Width: 0.4 ms
Lead Channel Setting Sensing Sensitivity: 0.9 mV

## 2021-12-07 NOTE — Progress Notes (Signed)
Remote pacemaker transmission.   

## 2021-12-09 DIAGNOSIS — L2082 Flexural eczema: Secondary | ICD-10-CM | POA: Diagnosis not present

## 2021-12-09 DIAGNOSIS — I10 Essential (primary) hypertension: Secondary | ICD-10-CM | POA: Diagnosis not present

## 2021-12-09 DIAGNOSIS — R202 Paresthesia of skin: Secondary | ICD-10-CM | POA: Diagnosis not present

## 2021-12-09 DIAGNOSIS — Z87891 Personal history of nicotine dependence: Secondary | ICD-10-CM | POA: Diagnosis not present

## 2021-12-09 DIAGNOSIS — E538 Deficiency of other specified B group vitamins: Secondary | ICD-10-CM | POA: Diagnosis not present

## 2021-12-09 DIAGNOSIS — R7303 Prediabetes: Secondary | ICD-10-CM | POA: Diagnosis not present

## 2021-12-09 DIAGNOSIS — R600 Localized edema: Secondary | ICD-10-CM | POA: Diagnosis not present

## 2021-12-09 DIAGNOSIS — E559 Vitamin D deficiency, unspecified: Secondary | ICD-10-CM | POA: Diagnosis not present

## 2021-12-09 DIAGNOSIS — I255 Ischemic cardiomyopathy: Secondary | ICD-10-CM | POA: Diagnosis not present

## 2022-01-27 DIAGNOSIS — H02532 Eyelid retraction right lower eyelid: Secondary | ICD-10-CM | POA: Diagnosis not present

## 2022-01-27 DIAGNOSIS — H5702 Anisocoria: Secondary | ICD-10-CM | POA: Diagnosis not present

## 2022-01-27 DIAGNOSIS — D352 Benign neoplasm of pituitary gland: Secondary | ICD-10-CM | POA: Diagnosis not present

## 2022-01-27 DIAGNOSIS — H02231 Paralytic lagophthalmos right upper eyelid: Secondary | ICD-10-CM | POA: Diagnosis not present

## 2022-01-27 DIAGNOSIS — H02531 Eyelid retraction right upper eyelid: Secondary | ICD-10-CM | POA: Diagnosis not present

## 2022-01-27 DIAGNOSIS — S0451XD Injury of facial nerve, right side, subsequent encounter: Secondary | ICD-10-CM | POA: Diagnosis not present

## 2022-01-27 DIAGNOSIS — H16211 Exposure keratoconjunctivitis, right eye: Secondary | ICD-10-CM | POA: Diagnosis not present

## 2022-01-27 DIAGNOSIS — H905 Unspecified sensorineural hearing loss: Secondary | ICD-10-CM | POA: Diagnosis not present

## 2022-02-22 ENCOUNTER — Ambulatory Visit (INDEPENDENT_AMBULATORY_CARE_PROVIDER_SITE_OTHER): Payer: Medicare Other

## 2022-02-22 DIAGNOSIS — I442 Atrioventricular block, complete: Secondary | ICD-10-CM | POA: Diagnosis not present

## 2022-02-25 LAB — CUP PACEART REMOTE DEVICE CHECK
Battery Remaining Longevity: 122 mo
Battery Voltage: 3.01 V
Brady Statistic AP VP Percent: 64.66 %
Brady Statistic AP VS Percent: 0 %
Brady Statistic AS VP Percent: 34.86 %
Brady Statistic AS VS Percent: 0.47 %
Brady Statistic RA Percent Paced: 64.92 %
Brady Statistic RV Percent Paced: 99.53 %
Date Time Interrogation Session: 20230904215417
Implantable Lead Implant Date: 20130125
Implantable Lead Implant Date: 20130125
Implantable Lead Location: 753859
Implantable Lead Location: 753860
Implantable Lead Model: 5076
Implantable Pulse Generator Implant Date: 20210902
Lead Channel Impedance Value: 361 Ohm
Lead Channel Impedance Value: 418 Ohm
Lead Channel Impedance Value: 456 Ohm
Lead Channel Impedance Value: 475 Ohm
Lead Channel Pacing Threshold Amplitude: 0.625 V
Lead Channel Pacing Threshold Amplitude: 0.75 V
Lead Channel Pacing Threshold Pulse Width: 0.4 ms
Lead Channel Pacing Threshold Pulse Width: 0.4 ms
Lead Channel Sensing Intrinsic Amplitude: 30.875 mV
Lead Channel Sensing Intrinsic Amplitude: 30.875 mV
Lead Channel Sensing Intrinsic Amplitude: 4 mV
Lead Channel Sensing Intrinsic Amplitude: 4 mV
Lead Channel Setting Pacing Amplitude: 1.5 V
Lead Channel Setting Pacing Amplitude: 2 V
Lead Channel Setting Pacing Pulse Width: 0.4 ms
Lead Channel Setting Sensing Sensitivity: 0.9 mV

## 2022-03-15 NOTE — Progress Notes (Signed)
Remote pacemaker transmission.   

## 2022-03-17 DIAGNOSIS — Z0001 Encounter for general adult medical examination with abnormal findings: Secondary | ICD-10-CM | POA: Diagnosis not present

## 2022-03-17 DIAGNOSIS — R202 Paresthesia of skin: Secondary | ICD-10-CM | POA: Diagnosis not present

## 2022-03-17 DIAGNOSIS — E559 Vitamin D deficiency, unspecified: Secondary | ICD-10-CM | POA: Diagnosis not present

## 2022-03-17 DIAGNOSIS — I10 Essential (primary) hypertension: Secondary | ICD-10-CM | POA: Diagnosis not present

## 2022-03-17 DIAGNOSIS — K0510 Chronic gingivitis, plaque induced: Secondary | ICD-10-CM | POA: Diagnosis not present

## 2022-03-17 DIAGNOSIS — L2082 Flexural eczema: Secondary | ICD-10-CM | POA: Diagnosis not present

## 2022-03-17 DIAGNOSIS — I255 Ischemic cardiomyopathy: Secondary | ICD-10-CM | POA: Diagnosis not present

## 2022-03-17 DIAGNOSIS — R7303 Prediabetes: Secondary | ICD-10-CM | POA: Diagnosis not present

## 2022-03-17 DIAGNOSIS — E538 Deficiency of other specified B group vitamins: Secondary | ICD-10-CM | POA: Diagnosis not present

## 2022-03-17 DIAGNOSIS — R600 Localized edema: Secondary | ICD-10-CM | POA: Diagnosis not present

## 2022-03-17 DIAGNOSIS — Z87891 Personal history of nicotine dependence: Secondary | ICD-10-CM | POA: Diagnosis not present

## 2022-03-23 DIAGNOSIS — M6289 Other specified disorders of muscle: Secondary | ICD-10-CM | POA: Diagnosis not present

## 2022-03-23 DIAGNOSIS — S0451XD Injury of facial nerve, right side, subsequent encounter: Secondary | ICD-10-CM | POA: Diagnosis not present

## 2022-03-23 DIAGNOSIS — H5711 Ocular pain, right eye: Secondary | ICD-10-CM | POA: Diagnosis not present

## 2022-03-23 DIAGNOSIS — S0451XS Injury of facial nerve, right side, sequela: Secondary | ICD-10-CM | POA: Diagnosis not present

## 2022-03-23 DIAGNOSIS — H02531 Eyelid retraction right upper eyelid: Secondary | ICD-10-CM | POA: Diagnosis not present

## 2022-03-23 DIAGNOSIS — H5789 Other specified disorders of eye and adnexa: Secondary | ICD-10-CM | POA: Diagnosis not present

## 2022-03-23 DIAGNOSIS — H02532 Eyelid retraction right lower eyelid: Secondary | ICD-10-CM | POA: Diagnosis not present

## 2022-03-23 DIAGNOSIS — H02231 Paralytic lagophthalmos right upper eyelid: Secondary | ICD-10-CM | POA: Diagnosis not present

## 2022-03-23 DIAGNOSIS — H16211 Exposure keratoconjunctivitis, right eye: Secondary | ICD-10-CM | POA: Diagnosis not present

## 2022-04-08 DIAGNOSIS — C751 Malignant neoplasm of pituitary gland: Secondary | ICD-10-CM | POA: Diagnosis not present

## 2022-05-23 DIAGNOSIS — D352 Benign neoplasm of pituitary gland: Secondary | ICD-10-CM | POA: Diagnosis not present

## 2022-05-24 ENCOUNTER — Ambulatory Visit (INDEPENDENT_AMBULATORY_CARE_PROVIDER_SITE_OTHER): Payer: Medicare Other

## 2022-05-24 DIAGNOSIS — I442 Atrioventricular block, complete: Secondary | ICD-10-CM | POA: Diagnosis not present

## 2022-05-24 LAB — CUP PACEART REMOTE DEVICE CHECK
Battery Remaining Longevity: 120 mo
Battery Voltage: 3.01 V
Brady Statistic AP VP Percent: 54.69 %
Brady Statistic AP VS Percent: 0 %
Brady Statistic AS VP Percent: 44.88 %
Brady Statistic AS VS Percent: 0.42 %
Brady Statistic RA Percent Paced: 54.85 %
Brady Statistic RV Percent Paced: 99.58 %
Date Time Interrogation Session: 20231204202518
Implantable Lead Connection Status: 753985
Implantable Lead Connection Status: 753985
Implantable Lead Implant Date: 20130125
Implantable Lead Implant Date: 20130125
Implantable Lead Location: 753859
Implantable Lead Location: 753860
Implantable Lead Model: 5076
Implantable Pulse Generator Implant Date: 20210902
Lead Channel Impedance Value: 361 Ohm
Lead Channel Impedance Value: 418 Ohm
Lead Channel Impedance Value: 494 Ohm
Lead Channel Impedance Value: 494 Ohm
Lead Channel Pacing Threshold Amplitude: 0.625 V
Lead Channel Pacing Threshold Amplitude: 0.75 V
Lead Channel Pacing Threshold Pulse Width: 0.4 ms
Lead Channel Pacing Threshold Pulse Width: 0.4 ms
Lead Channel Sensing Intrinsic Amplitude: 5.5 mV
Lead Channel Sensing Intrinsic Amplitude: 5.5 mV
Lead Channel Sensing Intrinsic Amplitude: 5.875 mV
Lead Channel Sensing Intrinsic Amplitude: 5.875 mV
Lead Channel Setting Pacing Amplitude: 1.5 V
Lead Channel Setting Pacing Amplitude: 2 V
Lead Channel Setting Pacing Pulse Width: 0.4 ms
Lead Channel Setting Sensing Sensitivity: 0.9 mV
Zone Setting Status: 755011

## 2022-06-21 NOTE — Progress Notes (Signed)
Remote pacemaker transmission.   

## 2022-07-28 DIAGNOSIS — I255 Ischemic cardiomyopathy: Secondary | ICD-10-CM | POA: Diagnosis not present

## 2022-07-28 DIAGNOSIS — Z87891 Personal history of nicotine dependence: Secondary | ICD-10-CM | POA: Diagnosis not present

## 2022-07-28 DIAGNOSIS — E559 Vitamin D deficiency, unspecified: Secondary | ICD-10-CM | POA: Diagnosis not present

## 2022-07-28 DIAGNOSIS — R7303 Prediabetes: Secondary | ICD-10-CM | POA: Diagnosis not present

## 2022-07-28 DIAGNOSIS — R600 Localized edema: Secondary | ICD-10-CM | POA: Diagnosis not present

## 2022-07-28 DIAGNOSIS — I10 Essential (primary) hypertension: Secondary | ICD-10-CM | POA: Diagnosis not present

## 2022-07-28 DIAGNOSIS — E538 Deficiency of other specified B group vitamins: Secondary | ICD-10-CM | POA: Diagnosis not present

## 2022-07-28 DIAGNOSIS — L2082 Flexural eczema: Secondary | ICD-10-CM | POA: Diagnosis not present

## 2022-07-28 DIAGNOSIS — R202 Paresthesia of skin: Secondary | ICD-10-CM | POA: Diagnosis not present

## 2022-08-23 ENCOUNTER — Ambulatory Visit (INDEPENDENT_AMBULATORY_CARE_PROVIDER_SITE_OTHER): Payer: Medicare Other

## 2022-08-23 DIAGNOSIS — I442 Atrioventricular block, complete: Secondary | ICD-10-CM | POA: Diagnosis not present

## 2022-08-24 LAB — CUP PACEART REMOTE DEVICE CHECK
Battery Remaining Longevity: 114 mo
Battery Voltage: 3 V
Brady Statistic AP VP Percent: 52.49 %
Brady Statistic AP VS Percent: 0 %
Brady Statistic AS VP Percent: 47.26 %
Brady Statistic AS VS Percent: 0.25 %
Brady Statistic RA Percent Paced: 52.55 %
Brady Statistic RV Percent Paced: 99.75 %
Date Time Interrogation Session: 20240305044957
Implantable Lead Connection Status: 753985
Implantable Lead Connection Status: 753985
Implantable Lead Implant Date: 20130125
Implantable Lead Implant Date: 20130125
Implantable Lead Location: 753859
Implantable Lead Location: 753860
Implantable Lead Model: 5076
Implantable Pulse Generator Implant Date: 20210902
Lead Channel Impedance Value: 342 Ohm
Lead Channel Impedance Value: 380 Ohm
Lead Channel Impedance Value: 418 Ohm
Lead Channel Impedance Value: 456 Ohm
Lead Channel Pacing Threshold Amplitude: 0.625 V
Lead Channel Pacing Threshold Amplitude: 0.625 V
Lead Channel Pacing Threshold Pulse Width: 0.4 ms
Lead Channel Pacing Threshold Pulse Width: 0.4 ms
Lead Channel Sensing Intrinsic Amplitude: 3.75 mV
Lead Channel Sensing Intrinsic Amplitude: 3.75 mV
Lead Channel Sensing Intrinsic Amplitude: 5.875 mV
Lead Channel Sensing Intrinsic Amplitude: 5.875 mV
Lead Channel Setting Pacing Amplitude: 1.5 V
Lead Channel Setting Pacing Amplitude: 2 V
Lead Channel Setting Pacing Pulse Width: 0.4 ms
Lead Channel Setting Sensing Sensitivity: 0.9 mV
Zone Setting Status: 755011

## 2022-09-28 NOTE — Progress Notes (Signed)
Remote pacemaker transmission.   

## 2022-11-17 DIAGNOSIS — S0451XD Injury of facial nerve, right side, subsequent encounter: Secondary | ICD-10-CM | POA: Diagnosis not present

## 2022-11-17 DIAGNOSIS — H0279 Other degenerative disorders of eyelid and periocular area: Secondary | ICD-10-CM | POA: Diagnosis not present

## 2022-11-17 DIAGNOSIS — H02231 Paralytic lagophthalmos right upper eyelid: Secondary | ICD-10-CM | POA: Diagnosis not present

## 2022-11-17 DIAGNOSIS — H16211 Exposure keratoconjunctivitis, right eye: Secondary | ICD-10-CM | POA: Diagnosis not present

## 2022-11-17 DIAGNOSIS — H02531 Eyelid retraction right upper eyelid: Secondary | ICD-10-CM | POA: Diagnosis not present

## 2022-11-17 DIAGNOSIS — H02532 Eyelid retraction right lower eyelid: Secondary | ICD-10-CM | POA: Diagnosis not present

## 2022-11-17 DIAGNOSIS — H04121 Dry eye syndrome of right lacrimal gland: Secondary | ICD-10-CM | POA: Diagnosis not present

## 2022-11-17 DIAGNOSIS — H04221 Epiphora due to insufficient drainage, right lacrimal gland: Secondary | ICD-10-CM | POA: Diagnosis not present

## 2022-11-17 DIAGNOSIS — D352 Benign neoplasm of pituitary gland: Secondary | ICD-10-CM | POA: Diagnosis not present

## 2023-02-22 DIAGNOSIS — R600 Localized edema: Secondary | ICD-10-CM | POA: Diagnosis not present

## 2023-02-22 DIAGNOSIS — Z8673 Personal history of transient ischemic attack (TIA), and cerebral infarction without residual deficits: Secondary | ICD-10-CM | POA: Diagnosis not present

## 2023-02-22 DIAGNOSIS — R7303 Prediabetes: Secondary | ICD-10-CM | POA: Diagnosis not present

## 2023-02-22 DIAGNOSIS — I10 Essential (primary) hypertension: Secondary | ICD-10-CM | POA: Diagnosis not present

## 2023-02-22 DIAGNOSIS — N3000 Acute cystitis without hematuria: Secondary | ICD-10-CM | POA: Diagnosis not present

## 2023-02-22 DIAGNOSIS — R202 Paresthesia of skin: Secondary | ICD-10-CM | POA: Diagnosis not present

## 2023-02-22 DIAGNOSIS — E559 Vitamin D deficiency, unspecified: Secondary | ICD-10-CM | POA: Diagnosis not present

## 2023-02-22 DIAGNOSIS — E538 Deficiency of other specified B group vitamins: Secondary | ICD-10-CM | POA: Diagnosis not present

## 2023-02-22 DIAGNOSIS — Z87891 Personal history of nicotine dependence: Secondary | ICD-10-CM | POA: Diagnosis not present

## 2023-02-22 DIAGNOSIS — I255 Ischemic cardiomyopathy: Secondary | ICD-10-CM | POA: Diagnosis not present

## 2023-02-22 DIAGNOSIS — Z0000 Encounter for general adult medical examination without abnormal findings: Secondary | ICD-10-CM | POA: Diagnosis not present

## 2023-03-08 DIAGNOSIS — K4091 Unilateral inguinal hernia, without obstruction or gangrene, recurrent: Secondary | ICD-10-CM | POA: Diagnosis not present

## 2023-03-08 DIAGNOSIS — N390 Urinary tract infection, site not specified: Secondary | ICD-10-CM | POA: Diagnosis not present

## 2023-03-08 DIAGNOSIS — Z950 Presence of cardiac pacemaker: Secondary | ICD-10-CM | POA: Diagnosis not present

## 2023-03-08 DIAGNOSIS — Z8673 Personal history of transient ischemic attack (TIA), and cerebral infarction without residual deficits: Secondary | ICD-10-CM | POA: Diagnosis not present

## 2023-03-08 DIAGNOSIS — E7800 Pure hypercholesterolemia, unspecified: Secondary | ICD-10-CM | POA: Diagnosis not present

## 2023-03-08 DIAGNOSIS — I10 Essential (primary) hypertension: Secondary | ICD-10-CM | POA: Diagnosis not present

## 2023-03-08 DIAGNOSIS — N3000 Acute cystitis without hematuria: Secondary | ICD-10-CM | POA: Diagnosis not present

## 2023-03-08 DIAGNOSIS — R7303 Prediabetes: Secondary | ICD-10-CM | POA: Diagnosis not present

## 2023-03-08 DIAGNOSIS — Z87891 Personal history of nicotine dependence: Secondary | ICD-10-CM | POA: Diagnosis not present

## 2023-03-08 DIAGNOSIS — I255 Ischemic cardiomyopathy: Secondary | ICD-10-CM | POA: Diagnosis not present

## 2023-03-16 ENCOUNTER — Encounter: Payer: Self-pay | Admitting: Cardiovascular Disease

## 2023-03-23 DIAGNOSIS — N3000 Acute cystitis without hematuria: Secondary | ICD-10-CM | POA: Diagnosis not present

## 2023-03-23 DIAGNOSIS — Z8673 Personal history of transient ischemic attack (TIA), and cerebral infarction without residual deficits: Secondary | ICD-10-CM | POA: Diagnosis not present

## 2023-03-23 DIAGNOSIS — R7303 Prediabetes: Secondary | ICD-10-CM | POA: Diagnosis not present

## 2023-03-23 DIAGNOSIS — Z0001 Encounter for general adult medical examination with abnormal findings: Secondary | ICD-10-CM | POA: Diagnosis not present

## 2023-03-23 DIAGNOSIS — Z950 Presence of cardiac pacemaker: Secondary | ICD-10-CM | POA: Diagnosis not present

## 2023-03-23 DIAGNOSIS — I10 Essential (primary) hypertension: Secondary | ICD-10-CM | POA: Diagnosis not present

## 2023-03-23 DIAGNOSIS — N390 Urinary tract infection, site not specified: Secondary | ICD-10-CM | POA: Diagnosis not present

## 2023-03-23 DIAGNOSIS — E7800 Pure hypercholesterolemia, unspecified: Secondary | ICD-10-CM | POA: Diagnosis not present

## 2023-03-23 DIAGNOSIS — K4091 Unilateral inguinal hernia, without obstruction or gangrene, recurrent: Secondary | ICD-10-CM | POA: Diagnosis not present

## 2023-03-23 DIAGNOSIS — I255 Ischemic cardiomyopathy: Secondary | ICD-10-CM | POA: Diagnosis not present

## 2023-03-23 DIAGNOSIS — Z87891 Personal history of nicotine dependence: Secondary | ICD-10-CM | POA: Diagnosis not present

## 2023-04-07 ENCOUNTER — Emergency Department (HOSPITAL_BASED_OUTPATIENT_CLINIC_OR_DEPARTMENT_OTHER)
Admission: EM | Admit: 2023-04-07 | Discharge: 2023-04-07 | Disposition: A | Discharge status: Home / Self Care | Payer: Medicare Other | Attending: Emergency Medicine | Admitting: Emergency Medicine

## 2023-04-07 ENCOUNTER — Emergency Department (HOSPITAL_BASED_OUTPATIENT_CLINIC_OR_DEPARTMENT_OTHER): Payer: Medicare Other

## 2023-04-07 ENCOUNTER — Other Ambulatory Visit: Payer: Self-pay

## 2023-04-07 ENCOUNTER — Encounter (HOSPITAL_BASED_OUTPATIENT_CLINIC_OR_DEPARTMENT_OTHER): Payer: Self-pay | Admitting: Emergency Medicine

## 2023-04-07 ENCOUNTER — Emergency Department (HOSPITAL_BASED_OUTPATIENT_CLINIC_OR_DEPARTMENT_OTHER): Payer: Medicare Other | Admitting: Radiology

## 2023-04-07 DIAGNOSIS — I10 Essential (primary) hypertension: Secondary | ICD-10-CM | POA: Diagnosis not present

## 2023-04-07 DIAGNOSIS — K4091 Unilateral inguinal hernia, without obstruction or gangrene, recurrent: Secondary | ICD-10-CM | POA: Diagnosis not present

## 2023-04-07 DIAGNOSIS — N390 Urinary tract infection, site not specified: Secondary | ICD-10-CM | POA: Diagnosis not present

## 2023-04-07 DIAGNOSIS — K4090 Unilateral inguinal hernia, without obstruction or gangrene, not specified as recurrent: Secondary | ICD-10-CM | POA: Diagnosis not present

## 2023-04-07 DIAGNOSIS — R918 Other nonspecific abnormal finding of lung field: Secondary | ICD-10-CM | POA: Diagnosis not present

## 2023-04-07 DIAGNOSIS — R1084 Generalized abdominal pain: Secondary | ICD-10-CM | POA: Diagnosis not present

## 2023-04-07 DIAGNOSIS — Z430 Encounter for attention to tracheostomy: Secondary | ICD-10-CM | POA: Diagnosis not present

## 2023-04-07 DIAGNOSIS — R531 Weakness: Secondary | ICD-10-CM | POA: Diagnosis not present

## 2023-04-07 DIAGNOSIS — Z8673 Personal history of transient ischemic attack (TIA), and cerebral infarction without residual deficits: Secondary | ICD-10-CM | POA: Diagnosis not present

## 2023-04-07 DIAGNOSIS — R7303 Prediabetes: Secondary | ICD-10-CM | POA: Diagnosis not present

## 2023-04-07 DIAGNOSIS — E7800 Pure hypercholesterolemia, unspecified: Secondary | ICD-10-CM | POA: Diagnosis not present

## 2023-04-07 DIAGNOSIS — Z950 Presence of cardiac pacemaker: Secondary | ICD-10-CM | POA: Diagnosis not present

## 2023-04-07 DIAGNOSIS — I255 Ischemic cardiomyopathy: Secondary | ICD-10-CM | POA: Diagnosis not present

## 2023-04-07 DIAGNOSIS — R9431 Abnormal electrocardiogram [ECG] [EKG]: Secondary | ICD-10-CM | POA: Diagnosis not present

## 2023-04-07 DIAGNOSIS — Z87891 Personal history of nicotine dependence: Secondary | ICD-10-CM | POA: Diagnosis not present

## 2023-04-07 DIAGNOSIS — R109 Unspecified abdominal pain: Secondary | ICD-10-CM | POA: Diagnosis present

## 2023-04-07 HISTORY — DX: Cerebral infarction, unspecified: I63.9

## 2023-04-07 LAB — CBC WITH DIFFERENTIAL/PLATELET
Abs Immature Granulocytes: 0.05 10*3/uL (ref 0.00–0.07)
Basophils Absolute: 0 10*3/uL (ref 0.0–0.1)
Basophils Relative: 0 %
Eosinophils Absolute: 0 10*3/uL (ref 0.0–0.5)
Eosinophils Relative: 0 %
HCT: 35.9 % — ABNORMAL LOW (ref 39.0–52.0)
Hemoglobin: 12 g/dL — ABNORMAL LOW (ref 13.0–17.0)
Immature Granulocytes: 1 %
Lymphocytes Relative: 7 %
Lymphs Abs: 0.8 10*3/uL (ref 0.7–4.0)
MCH: 31.3 pg (ref 26.0–34.0)
MCHC: 33.4 g/dL (ref 30.0–36.0)
MCV: 93.5 fL (ref 80.0–100.0)
Monocytes Absolute: 0.9 10*3/uL (ref 0.1–1.0)
Monocytes Relative: 8 %
Neutro Abs: 9.2 10*3/uL — ABNORMAL HIGH (ref 1.7–7.7)
Neutrophils Relative %: 84 %
Platelets: 234 10*3/uL (ref 150–400)
RBC: 3.84 MIL/uL — ABNORMAL LOW (ref 4.22–5.81)
RDW: 14.2 % (ref 11.5–15.5)
WBC: 11 10*3/uL — ABNORMAL HIGH (ref 4.0–10.5)
nRBC: 0 % (ref 0.0–0.2)

## 2023-04-07 LAB — HEPATIC FUNCTION PANEL
ALT: 57 U/L — ABNORMAL HIGH (ref 0–44)
AST: 38 U/L (ref 15–41)
Albumin: 3.6 g/dL (ref 3.5–5.0)
Alkaline Phosphatase: 95 U/L (ref 38–126)
Bilirubin, Direct: 0.1 mg/dL (ref 0.0–0.2)
Indirect Bilirubin: 0.6 mg/dL (ref 0.3–0.9)
Total Bilirubin: 0.7 mg/dL (ref 0.3–1.2)
Total Protein: 7.5 g/dL (ref 6.5–8.1)

## 2023-04-07 LAB — URINALYSIS, W/ REFLEX TO CULTURE (INFECTION SUSPECTED)
Bacteria, UA: NONE SEEN
Bilirubin Urine: NEGATIVE
Glucose, UA: NEGATIVE mg/dL
Hgb urine dipstick: NEGATIVE
Ketones, ur: NEGATIVE mg/dL
Leukocytes,Ua: NEGATIVE
Nitrite: NEGATIVE
Specific Gravity, Urine: >1.046 — ABNORMAL HIGH (ref 1.005–1.030)
pH: 6.5 (ref 5.0–8.0)

## 2023-04-07 LAB — BASIC METABOLIC PANEL
Anion gap: 6 (ref 5–15)
BUN: 18 mg/dL (ref 8–23)
CO2: 26 mmol/L (ref 22–32)
Calcium: 8.5 mg/dL — ABNORMAL LOW (ref 8.9–10.3)
Chloride: 100 mmol/L (ref 98–111)
Creatinine, Ser: 0.8 mg/dL (ref 0.61–1.24)
GFR, Estimated: >60 mL/min (ref >60)
Glucose, Bld: 99 mg/dL (ref 70–99)
Potassium: 4.6 mmol/L (ref 3.5–5.1)
Sodium: 132 mmol/L — ABNORMAL LOW (ref 135–145)

## 2023-04-07 LAB — LIPASE, BLOOD: Lipase: 365 U/L — ABNORMAL HIGH (ref 11–51)

## 2023-04-07 MED ORDER — IOHEXOL 300 MG/ML  SOLN
100.0000 mL | Freq: Once | INTRAMUSCULAR | Status: AC | PRN
  Administered 2023-04-07: 100 mL via INTRAVENOUS

## 2023-04-07 MED ORDER — ACETAMINOPHEN 325 MG PO TABS: 650.0000 mg | ORAL_TABLET | Freq: Once | ORAL | Status: DC

## 2023-04-07 NOTE — ED Triage Notes (Signed)
Seen at PCP sent for eval of his hernia. Patient states she is concerned it is getting worse/ infected. Worse over tha last 5 day

## 2023-04-07 NOTE — ED Notes (Signed)
Reviewed AVS/discharge instruction with patient. Time allotted for and all questions answered. Patient is agreeable for d/c and escorted to ed exit by staff.  

## 2023-04-07 NOTE — ED Provider Notes (Signed)
[place] EMERGENCY DEPARTMENT AT [facility] Provider Note   CSN: [id number] Arrival date & time: 04/07/23  1419     History {Add pertinent medical, surgical, social history, OB history to HPI:1} No chief complaint on file.   Casey Richard is a 67 y.o. male.  HPI 67 year old male history of prior stroke, ischemic cardiomyopathy, left-sided inguinal hernia presenting for pelvic pain.  He states for several weeks he has had dysuria, some difficulty urinating, has been treated for UTI.  He is currently on Bactrim.  He also has some lower abdominal pain.  He would see his PCP today who was concerned about his pelvic pain and sent here for evaluation.  He is history of left-sided inguinal hernia which has not changed.  He supposed get surgery on this in January but is concerned about how long it is taking.  He is felt some generalized weakness lately, no nausea or vomiting.  No chest pain or shortness of breath.  Normal bowel movements.  No penile or testicular pain other than urethral pain while urinating.     Home Medications Prior to Admission medications   Medication Sig Start Date End Date Taking? Authorizing Provider  artificial tears (LACRILUBE) OINT ophthalmic ointment Place into the right eye every 4 (four) hours. 11/14/19   Jadene Pierini, MD  Cholecalciferol 100 MCG (4000 UT) CAPS Take 1 capsule by mouth daily.    [provider]  gabapentin (NEURONTIN) 100 MG capsule TAKE 1 CAPSULE BY MOUTH ONCE AT NIGHT 06/02/20   [provider]  tamsulosin (FLOMAX) 0.4 MG CAPS capsule Take 0.4 mg by mouth at bedtime. 06/02/20   [provider]  vitamin B-12 (CYANOCOBALAMIN) 1000 MCG tablet Take 2,000 mcg by mouth daily.    [provider]      Allergies    Patient has no known allergies.    Review of Systems   Review of Systems Review of systems completed and notable as per HPI.  ROS otherwise negative.   Physical Exam Updated  Vital Signs BP 124/65 (BP Location: Right Arm)   Pulse 96   Temp 98 F (36.7 C) (Oral)   Resp 18   SpO2 100%  Physical Exam Vitals and nursing note reviewed.  Constitutional:      General: He is not in acute distress.    Appearance: He is well-developed.  HENT:     Head: Normocephalic and atraumatic.     Nose: Nose normal.     Mouth/Throat:     Mouth: Mucous membranes are moist.     Pharynx: Oropharynx is clear.  Eyes:     Extraocular Movements: Extraocular movements intact.     Conjunctiva/sclera: Conjunctivae normal.     Pupils: Pupils are equal, round, and reactive to light.  Cardiovascular:     Rate and Rhythm: Normal rate and regular rhythm.     Pulses: Normal pulses.     Heart sounds: Normal heart sounds. No murmur heard. Pulmonary:     Effort: Pulmonary effort is normal. No respiratory distress.     Breath sounds: Normal breath sounds.  Abdominal:     Palpations: Abdomen is soft.     Tenderness: There is abdominal tenderness. There is no guarding or rebound.     Comments: Mild generalized tenderness worse in the lower abdomen he has left-sided inguinal hernia.  It is nontender with no skin changes.  It is easily reduced on exam.  Musculoskeletal:        General:  No swelling.     Cervical back: Neck supple.  Skin:    General: Skin is warm and dry.     Capillary Refill: Capillary refill takes less than 2 seconds.  Neurological:     Mental Status: He is alert and oriented to person, place, and time. Mental status is at baseline.     Cranial Nerves: No cranial nerve deficit.     Motor: No weakness.     Comments: Baseline right-sided facial droop.  Psychiatric:        Mood and Affect: Mood normal.     ED Results / Procedures / Treatments   Labs (all labs ordered are listed, but only abnormal results are displayed) Labs Reviewed  CBC WITH DIFFERENTIAL/PLATELET - Abnormal; Notable for the following components:      Result Value   WBC 11.0 (*)    RBC 3.84 (*)     Hemoglobin 12.0 (*)    HCT 35.9 (*)    Neutro Abs 9.2 (*)    All other components within normal limits  BASIC METABOLIC PANEL - Abnormal; Notable for the following components:   Sodium 132 (*)    Calcium 8.5 (*)    All other components within normal limits    EKG None  Radiology No results found.  Procedures Procedures  {Document cardiac monitor, telemetry assessment procedure when appropriate:1}  Medications Ordered in ED Medications - No data to display  ED Course/ Medical Decision Making/ A&P   {   Click here for ABCD2, HEART and other calculatorsREFRESH Note before signing :1}                              Medical Decision Making Amount and/or Complexity of Data Reviewed Labs: ordered. Radiology: ordered.  Risk OTC drugs.   Medical Decision Making:   Casey Richard is a 67 y.o. male who presented to the ED today with abdominal pain/pelvic pain, dysuria, generalized weakness.  Vital signs reviewed.  Here he is overall well-appearing.  He reports generalized weakness but has no focal weakness and only baseline right-sided facial droop which is unchanged.  I have low suspicion for intracranial normality.  His pain is primarily in his lower abdomen associate with urination.  Unclear if he is having urinary retention.  Is been treated multiple times recently for UTI and is currently on Bactrim.  No fever or signs of sepsis.  Will plan to evaluate his urinalysis today.  He does have a small left-sided reducible inguinal hernia, no signs incarceration or strangulation.   {crccomplexity:27900} Reviewed and confirmed nursing documentation for past medical history, family history, social history.  Initial Study Results:   Laboratory  All laboratory results reviewed.  Labs notable for ***  ***EKG EKG was reviewed independently. Rate, rhythm, axis, intervals all examined and without medically relevant abnormality. ST segments without concerns for elevations.     Radiology:  All images reviewed independently. ***Agree with radiology report at this time.      Consults: Case discussed with ***.   Reassessment and Plan:   ***    Patient's presentation is most consistent with {EM COPA:27473}     {Document critical care time when appropriate:1} {Document review of labs and clinical decision tools ie heart score, Chads2Vasc2 etc:1}  {Document your independent review of radiology images, and any outside records:1} {Document your discussion with family members, caretakers, and with consultants:1} {Document social determinants of health affecting pt's care:1} {Document  your decision making why or why not admission, treatments were needed:1} Final Clinical Impression(s) / ED Diagnoses Final diagnoses:  None    Rx / DC Orders ED Discharge Orders     None

## 2023-04-07 NOTE — Discharge Instructions (Signed)
Your CT scan shows a stone in your bladder.  Your lipase which is an enzyme from your pancreas is elevated as well, it is not clear why this is happening.  He also have an inguinal hernia.  You wanted to leave the hospital, I recommend you follow-up closely with your doctor and call them on Monday to schedule follow-up.  If you develop fever, difficulty urinating, persistent vomiting, severe abdominal pain or any other new concerning symptoms you need to return to the ED.

## 2023-05-10 DIAGNOSIS — R3915 Urgency of urination: Secondary | ICD-10-CM | POA: Diagnosis not present

## 2023-05-10 DIAGNOSIS — N3000 Acute cystitis without hematuria: Secondary | ICD-10-CM | POA: Diagnosis not present

## 2023-05-10 DIAGNOSIS — N35011 Post-traumatic bulbous urethral stricture: Secondary | ICD-10-CM | POA: Diagnosis not present

## 2023-05-25 ENCOUNTER — Ambulatory Visit: Payer: Medicare Other | Attending: Cardiovascular Disease | Admitting: Cardiovascular Disease

## 2023-06-12 ENCOUNTER — Other Ambulatory Visit (HOSPITAL_COMMUNITY): Payer: Self-pay | Admitting: Urology

## 2023-06-12 DIAGNOSIS — N35011 Post-traumatic bulbous urethral stricture: Secondary | ICD-10-CM

## 2023-07-11 DIAGNOSIS — R7303 Prediabetes: Secondary | ICD-10-CM | POA: Diagnosis not present

## 2023-07-11 DIAGNOSIS — I10 Essential (primary) hypertension: Secondary | ICD-10-CM | POA: Diagnosis not present

## 2023-07-11 DIAGNOSIS — Z87891 Personal history of nicotine dependence: Secondary | ICD-10-CM | POA: Diagnosis not present

## 2023-07-11 DIAGNOSIS — Z8673 Personal history of transient ischemic attack (TIA), and cerebral infarction without residual deficits: Secondary | ICD-10-CM | POA: Diagnosis not present

## 2023-07-11 DIAGNOSIS — Z950 Presence of cardiac pacemaker: Secondary | ICD-10-CM | POA: Diagnosis not present

## 2023-07-11 DIAGNOSIS — R29810 Facial weakness: Secondary | ICD-10-CM | POA: Diagnosis not present

## 2023-07-11 DIAGNOSIS — I255 Ischemic cardiomyopathy: Secondary | ICD-10-CM | POA: Diagnosis not present

## 2023-07-11 DIAGNOSIS — E7800 Pure hypercholesterolemia, unspecified: Secondary | ICD-10-CM | POA: Diagnosis not present

## 2023-07-14 DIAGNOSIS — N390 Urinary tract infection, site not specified: Secondary | ICD-10-CM | POA: Diagnosis not present

## 2023-07-14 DIAGNOSIS — N210 Calculus in bladder: Secondary | ICD-10-CM | POA: Diagnosis not present

## 2023-07-14 DIAGNOSIS — R3129 Other microscopic hematuria: Secondary | ICD-10-CM | POA: Diagnosis not present

## 2023-07-14 DIAGNOSIS — N35011 Post-traumatic bulbous urethral stricture: Secondary | ICD-10-CM | POA: Diagnosis not present

## 2023-07-14 DIAGNOSIS — R338 Other retention of urine: Secondary | ICD-10-CM | POA: Diagnosis not present

## 2023-08-01 DIAGNOSIS — I10 Essential (primary) hypertension: Secondary | ICD-10-CM | POA: Diagnosis not present

## 2023-08-01 DIAGNOSIS — R7303 Prediabetes: Secondary | ICD-10-CM | POA: Diagnosis not present

## 2023-08-01 DIAGNOSIS — Z8673 Personal history of transient ischemic attack (TIA), and cerebral infarction without residual deficits: Secondary | ICD-10-CM | POA: Diagnosis not present

## 2023-08-01 DIAGNOSIS — I255 Ischemic cardiomyopathy: Secondary | ICD-10-CM | POA: Diagnosis not present

## 2023-08-01 DIAGNOSIS — E7800 Pure hypercholesterolemia, unspecified: Secondary | ICD-10-CM | POA: Diagnosis not present

## 2023-08-01 DIAGNOSIS — Z87891 Personal history of nicotine dependence: Secondary | ICD-10-CM | POA: Diagnosis not present

## 2023-08-01 DIAGNOSIS — Z950 Presence of cardiac pacemaker: Secondary | ICD-10-CM | POA: Diagnosis not present

## 2023-08-22 ENCOUNTER — Ambulatory Visit (INDEPENDENT_AMBULATORY_CARE_PROVIDER_SITE_OTHER): Payer: Medicare Other

## 2023-08-22 DIAGNOSIS — I442 Atrioventricular block, complete: Secondary | ICD-10-CM

## 2023-08-24 LAB — CUP PACEART REMOTE DEVICE CHECK
Battery Remaining Longevity: 104 mo
Battery Voltage: 3 V
Brady Statistic AP VP Percent: 13.75 %
Brady Statistic AP VS Percent: 0 %
Brady Statistic AS VP Percent: 86.01 %
Brady Statistic AS VS Percent: 0.23 %
Brady Statistic RA Percent Paced: 13.76 %
Brady Statistic RV Percent Paced: 99.76 %
Date Time Interrogation Session: 20250304054301
Implantable Lead Connection Status: 753985
Implantable Lead Connection Status: 753985
Implantable Lead Implant Date: 20130125
Implantable Lead Implant Date: 20130125
Implantable Lead Location: 753859
Implantable Lead Location: 753860
Implantable Lead Model: 5076
Implantable Pulse Generator Implant Date: 20210902
Lead Channel Impedance Value: 361 Ohm
Lead Channel Impedance Value: 361 Ohm
Lead Channel Impedance Value: 456 Ohm
Lead Channel Impedance Value: 475 Ohm
Lead Channel Pacing Threshold Amplitude: 0.625 V
Lead Channel Pacing Threshold Amplitude: 0.75 V
Lead Channel Pacing Threshold Pulse Width: 0.4 ms
Lead Channel Pacing Threshold Pulse Width: 0.4 ms
Lead Channel Sensing Intrinsic Amplitude: 27.5 mV
Lead Channel Sensing Intrinsic Amplitude: 27.5 mV
Lead Channel Sensing Intrinsic Amplitude: 5 mV
Lead Channel Sensing Intrinsic Amplitude: 5 mV
Lead Channel Setting Pacing Amplitude: 1.5 V
Lead Channel Setting Pacing Amplitude: 2 V
Lead Channel Setting Pacing Pulse Width: 0.4 ms
Lead Channel Setting Sensing Sensitivity: 0.9 mV
Zone Setting Status: 755011

## 2023-09-25 NOTE — Addendum Note (Signed)
 Addended by: Geralyn Flash D on: 09/25/2023 05:23 PM   Modules accepted: Orders

## 2023-09-25 NOTE — Progress Notes (Signed)
 Remote pacemaker transmission.

## 2023-10-30 DIAGNOSIS — Z8673 Personal history of transient ischemic attack (TIA), and cerebral infarction without residual deficits: Secondary | ICD-10-CM | POA: Diagnosis not present

## 2023-10-30 DIAGNOSIS — I255 Ischemic cardiomyopathy: Secondary | ICD-10-CM | POA: Diagnosis not present

## 2023-10-30 DIAGNOSIS — R7303 Prediabetes: Secondary | ICD-10-CM | POA: Diagnosis not present

## 2023-10-30 DIAGNOSIS — Z87891 Personal history of nicotine dependence: Secondary | ICD-10-CM | POA: Diagnosis not present

## 2023-10-30 DIAGNOSIS — Z950 Presence of cardiac pacemaker: Secondary | ICD-10-CM | POA: Diagnosis not present

## 2023-10-30 DIAGNOSIS — I10 Essential (primary) hypertension: Secondary | ICD-10-CM | POA: Diagnosis not present

## 2023-10-30 DIAGNOSIS — E7800 Pure hypercholesterolemia, unspecified: Secondary | ICD-10-CM | POA: Diagnosis not present

## 2023-11-21 ENCOUNTER — Ambulatory Visit (INDEPENDENT_AMBULATORY_CARE_PROVIDER_SITE_OTHER)

## 2023-11-21 DIAGNOSIS — I442 Atrioventricular block, complete: Secondary | ICD-10-CM

## 2023-11-22 ENCOUNTER — Telehealth: Payer: Self-pay | Admitting: Emergency Medicine

## 2023-11-22 ENCOUNTER — Ambulatory Visit: Payer: Self-pay | Admitting: Cardiovascular Disease

## 2023-11-22 DIAGNOSIS — I4729 Other ventricular tachycardia: Secondary | ICD-10-CM

## 2023-11-22 LAB — CUP PACEART REMOTE DEVICE CHECK
Battery Remaining Longevity: 102 mo
Battery Voltage: 3 V
Brady Statistic AP VP Percent: 13.64 %
Brady Statistic AP VS Percent: 0 %
Brady Statistic AS VP Percent: 86.2 %
Brady Statistic AS VS Percent: 0.15 %
Brady Statistic RA Percent Paced: 13.64 %
Brady Statistic RV Percent Paced: 99.84 %
Date Time Interrogation Session: 20250604074751
Implantable Lead Connection Status: 753985
Implantable Lead Connection Status: 753985
Implantable Lead Implant Date: 20130125
Implantable Lead Implant Date: 20130125
Implantable Lead Location: 753859
Implantable Lead Location: 753860
Implantable Lead Model: 5076
Implantable Pulse Generator Implant Date: 20210902
Lead Channel Impedance Value: 342 Ohm
Lead Channel Impedance Value: 380 Ohm
Lead Channel Impedance Value: 456 Ohm
Lead Channel Impedance Value: 475 Ohm
Lead Channel Pacing Threshold Amplitude: 0.625 V
Lead Channel Pacing Threshold Amplitude: 0.75 V
Lead Channel Pacing Threshold Pulse Width: 0.4 ms
Lead Channel Pacing Threshold Pulse Width: 0.4 ms
Lead Channel Sensing Intrinsic Amplitude: 28.375 mV
Lead Channel Sensing Intrinsic Amplitude: 28.375 mV
Lead Channel Sensing Intrinsic Amplitude: 5.25 mV
Lead Channel Sensing Intrinsic Amplitude: 5.25 mV
Lead Channel Setting Pacing Amplitude: 1.5 V
Lead Channel Setting Pacing Amplitude: 2 V
Lead Channel Setting Pacing Pulse Width: 0.4 ms
Lead Channel Setting Sensing Sensitivity: 0.9 mV
Zone Setting Status: 755011

## 2023-11-22 NOTE — Telephone Encounter (Signed)
 Left message stating that we have some results that we would like to go over with the patient. Left call back number. Also, the provider has ordered an Echocardiogram and someone will call to get this scheduled.   Echo ordered

## 2023-12-06 ENCOUNTER — Encounter (HOSPITAL_COMMUNITY): Payer: Self-pay | Admitting: Cardiovascular Disease

## 2023-12-15 DIAGNOSIS — R7303 Prediabetes: Secondary | ICD-10-CM | POA: Diagnosis not present

## 2023-12-15 DIAGNOSIS — I255 Ischemic cardiomyopathy: Secondary | ICD-10-CM | POA: Diagnosis not present

## 2023-12-15 DIAGNOSIS — I10 Essential (primary) hypertension: Secondary | ICD-10-CM | POA: Diagnosis not present

## 2023-12-15 DIAGNOSIS — R300 Dysuria: Secondary | ICD-10-CM | POA: Diagnosis not present

## 2023-12-15 DIAGNOSIS — Z87891 Personal history of nicotine dependence: Secondary | ICD-10-CM | POA: Diagnosis not present

## 2023-12-15 DIAGNOSIS — R8290 Unspecified abnormal findings in urine: Secondary | ICD-10-CM | POA: Diagnosis not present

## 2023-12-15 DIAGNOSIS — E7800 Pure hypercholesterolemia, unspecified: Secondary | ICD-10-CM | POA: Diagnosis not present

## 2023-12-15 DIAGNOSIS — N3001 Acute cystitis with hematuria: Secondary | ICD-10-CM | POA: Diagnosis not present

## 2023-12-29 ENCOUNTER — Encounter: Payer: Self-pay | Admitting: Cardiovascular Disease

## 2024-01-16 NOTE — Addendum Note (Signed)
 Addended by: VICCI SELLER A on: 01/16/2024 01:28 PM   Modules accepted: Orders

## 2024-01-16 NOTE — Progress Notes (Signed)
 Remote pacemaker transmission.

## 2024-02-20 ENCOUNTER — Ambulatory Visit (INDEPENDENT_AMBULATORY_CARE_PROVIDER_SITE_OTHER)

## 2024-02-20 DIAGNOSIS — I4729 Other ventricular tachycardia: Secondary | ICD-10-CM

## 2024-02-22 LAB — CUP PACEART REMOTE DEVICE CHECK
Battery Remaining Longevity: 100 mo
Battery Voltage: 2.99 V
Brady Statistic AP VP Percent: 17.97 %
Brady Statistic AP VS Percent: 0 %
Brady Statistic AS VP Percent: 81.84 %
Brady Statistic AS VS Percent: 0.18 %
Brady Statistic RA Percent Paced: 17.96 %
Brady Statistic RV Percent Paced: 99.81 %
Date Time Interrogation Session: 20250902004648
Implantable Lead Connection Status: 753985
Implantable Lead Connection Status: 753985
Implantable Lead Implant Date: 20130125
Implantable Lead Implant Date: 20130125
Implantable Lead Location: 753859
Implantable Lead Location: 753860
Implantable Lead Model: 5076
Implantable Pulse Generator Implant Date: 20210902
Lead Channel Impedance Value: 342 Ohm
Lead Channel Impedance Value: 380 Ohm
Lead Channel Impedance Value: 456 Ohm
Lead Channel Impedance Value: 475 Ohm
Lead Channel Pacing Threshold Amplitude: 0.625 V
Lead Channel Pacing Threshold Amplitude: 0.625 V
Lead Channel Pacing Threshold Pulse Width: 0.4 ms
Lead Channel Pacing Threshold Pulse Width: 0.4 ms
Lead Channel Sensing Intrinsic Amplitude: 31 mV
Lead Channel Sensing Intrinsic Amplitude: 31 mV
Lead Channel Sensing Intrinsic Amplitude: 5 mV
Lead Channel Sensing Intrinsic Amplitude: 5 mV
Lead Channel Setting Pacing Amplitude: 1.5 V
Lead Channel Setting Pacing Amplitude: 2 V
Lead Channel Setting Pacing Pulse Width: 0.4 ms
Lead Channel Setting Sensing Sensitivity: 0.9 mV
Zone Setting Status: 755011

## 2024-02-23 ENCOUNTER — Ambulatory Visit: Payer: Self-pay | Admitting: Cardiovascular Disease

## 2024-02-26 DIAGNOSIS — R7303 Prediabetes: Secondary | ICD-10-CM | POA: Diagnosis not present

## 2024-02-26 DIAGNOSIS — R600 Localized edema: Secondary | ICD-10-CM | POA: Diagnosis not present

## 2024-02-26 DIAGNOSIS — Z0000 Encounter for general adult medical examination without abnormal findings: Secondary | ICD-10-CM | POA: Diagnosis not present

## 2024-02-26 DIAGNOSIS — Z8673 Personal history of transient ischemic attack (TIA), and cerebral infarction without residual deficits: Secondary | ICD-10-CM | POA: Diagnosis not present

## 2024-02-26 DIAGNOSIS — E782 Mixed hyperlipidemia: Secondary | ICD-10-CM | POA: Diagnosis not present

## 2024-02-26 DIAGNOSIS — E559 Vitamin D deficiency, unspecified: Secondary | ICD-10-CM | POA: Diagnosis not present

## 2024-02-26 DIAGNOSIS — R202 Paresthesia of skin: Secondary | ICD-10-CM | POA: Diagnosis not present

## 2024-02-26 DIAGNOSIS — Z87891 Personal history of nicotine dependence: Secondary | ICD-10-CM | POA: Diagnosis not present

## 2024-02-26 DIAGNOSIS — I10 Essential (primary) hypertension: Secondary | ICD-10-CM | POA: Diagnosis not present

## 2024-02-26 DIAGNOSIS — E538 Deficiency of other specified B group vitamins: Secondary | ICD-10-CM | POA: Diagnosis not present

## 2024-02-26 DIAGNOSIS — N3001 Acute cystitis with hematuria: Secondary | ICD-10-CM | POA: Diagnosis not present

## 2024-02-26 DIAGNOSIS — I255 Ischemic cardiomyopathy: Secondary | ICD-10-CM | POA: Diagnosis not present

## 2024-02-27 NOTE — Progress Notes (Signed)
 Remote PPM Transmission

## 2024-03-11 DIAGNOSIS — I255 Ischemic cardiomyopathy: Secondary | ICD-10-CM | POA: Diagnosis not present

## 2024-03-11 DIAGNOSIS — R7303 Prediabetes: Secondary | ICD-10-CM | POA: Diagnosis not present

## 2024-03-11 DIAGNOSIS — I10 Essential (primary) hypertension: Secondary | ICD-10-CM | POA: Diagnosis not present

## 2024-03-11 DIAGNOSIS — N3001 Acute cystitis with hematuria: Secondary | ICD-10-CM | POA: Diagnosis not present

## 2024-03-11 DIAGNOSIS — E559 Vitamin D deficiency, unspecified: Secondary | ICD-10-CM | POA: Diagnosis not present

## 2024-03-11 DIAGNOSIS — Z87891 Personal history of nicotine dependence: Secondary | ICD-10-CM | POA: Diagnosis not present

## 2024-03-11 DIAGNOSIS — E538 Deficiency of other specified B group vitamins: Secondary | ICD-10-CM | POA: Diagnosis not present

## 2024-03-11 DIAGNOSIS — Z8673 Personal history of transient ischemic attack (TIA), and cerebral infarction without residual deficits: Secondary | ICD-10-CM | POA: Diagnosis not present

## 2024-03-11 DIAGNOSIS — R202 Paresthesia of skin: Secondary | ICD-10-CM | POA: Diagnosis not present

## 2024-03-11 DIAGNOSIS — E782 Mixed hyperlipidemia: Secondary | ICD-10-CM | POA: Diagnosis not present

## 2024-03-25 DIAGNOSIS — E782 Mixed hyperlipidemia: Secondary | ICD-10-CM | POA: Diagnosis not present

## 2024-03-25 DIAGNOSIS — Z87891 Personal history of nicotine dependence: Secondary | ICD-10-CM | POA: Diagnosis not present

## 2024-03-25 DIAGNOSIS — E559 Vitamin D deficiency, unspecified: Secondary | ICD-10-CM | POA: Diagnosis not present

## 2024-03-25 DIAGNOSIS — I255 Ischemic cardiomyopathy: Secondary | ICD-10-CM | POA: Diagnosis not present

## 2024-03-25 DIAGNOSIS — N3000 Acute cystitis without hematuria: Secondary | ICD-10-CM | POA: Diagnosis not present

## 2024-03-25 DIAGNOSIS — Z0001 Encounter for general adult medical examination with abnormal findings: Secondary | ICD-10-CM | POA: Diagnosis not present

## 2024-03-25 DIAGNOSIS — I10 Essential (primary) hypertension: Secondary | ICD-10-CM | POA: Diagnosis not present

## 2024-03-25 DIAGNOSIS — E538 Deficiency of other specified B group vitamins: Secondary | ICD-10-CM | POA: Diagnosis not present

## 2024-03-25 DIAGNOSIS — Z8673 Personal history of transient ischemic attack (TIA), and cerebral infarction without residual deficits: Secondary | ICD-10-CM | POA: Diagnosis not present

## 2024-03-25 DIAGNOSIS — R202 Paresthesia of skin: Secondary | ICD-10-CM | POA: Diagnosis not present

## 2024-03-25 DIAGNOSIS — R7303 Prediabetes: Secondary | ICD-10-CM | POA: Diagnosis not present

## 2024-05-21 ENCOUNTER — Ambulatory Visit

## 2024-05-21 DIAGNOSIS — I4729 Other ventricular tachycardia: Secondary | ICD-10-CM

## 2024-05-22 LAB — CUP PACEART REMOTE DEVICE CHECK
Battery Remaining Longevity: 98 mo
Battery Voltage: 2.99 V
Brady Statistic AP VP Percent: 18.81 %
Brady Statistic AP VS Percent: 0 %
Brady Statistic AS VP Percent: 81.06 %
Brady Statistic AS VS Percent: 0.13 %
Brady Statistic RA Percent Paced: 18.79 %
Brady Statistic RV Percent Paced: 99.87 %
Date Time Interrogation Session: 20251201185751
Implantable Lead Connection Status: 753985
Implantable Lead Connection Status: 753985
Implantable Lead Implant Date: 20130125
Implantable Lead Implant Date: 20130125
Implantable Lead Location: 753859
Implantable Lead Location: 753860
Implantable Lead Model: 5076
Implantable Pulse Generator Implant Date: 20210902
Lead Channel Impedance Value: 361 Ohm
Lead Channel Impedance Value: 380 Ohm
Lead Channel Impedance Value: 475 Ohm
Lead Channel Impedance Value: 494 Ohm
Lead Channel Pacing Threshold Amplitude: 0.75 V
Lead Channel Pacing Threshold Amplitude: 0.75 V
Lead Channel Pacing Threshold Pulse Width: 0.4 ms
Lead Channel Pacing Threshold Pulse Width: 0.4 ms
Lead Channel Sensing Intrinsic Amplitude: 31 mV
Lead Channel Sensing Intrinsic Amplitude: 31 mV
Lead Channel Sensing Intrinsic Amplitude: 5 mV
Lead Channel Sensing Intrinsic Amplitude: 5 mV
Lead Channel Setting Pacing Amplitude: 1.5 V
Lead Channel Setting Pacing Amplitude: 2 V
Lead Channel Setting Pacing Pulse Width: 0.4 ms
Lead Channel Setting Sensing Sensitivity: 0.9 mV
Zone Setting Status: 755011

## 2024-05-23 ENCOUNTER — Encounter: Payer: Self-pay | Admitting: Cardiovascular Disease

## 2024-05-23 ENCOUNTER — Ambulatory Visit: Attending: Cardiovascular Disease | Admitting: Cardiovascular Disease

## 2024-05-23 ENCOUNTER — Ambulatory Visit: Payer: Self-pay | Admitting: Cardiovascular Disease

## 2024-05-24 NOTE — Progress Notes (Signed)
 Remote PPM Transmission

## 2024-06-06 ENCOUNTER — Telehealth: Payer: Self-pay | Admitting: Cardiovascular Disease

## 2024-06-06 NOTE — Telephone Encounter (Signed)
 Pt missed Ppm check 12/4 Dr. JAYSON does not have anything can pt see EP APP? Please advise.

## 2024-06-07 ENCOUNTER — Telehealth: Payer: Self-pay | Admitting: Emergency Medicine

## 2024-06-07 NOTE — Telephone Encounter (Signed)
 Left message for patient to call to get an appointment with Dr Francyne

## 2024-08-20 ENCOUNTER — Encounter

## 2024-11-19 ENCOUNTER — Encounter
